# Patient Record
Sex: Female | Born: 1940
Health system: Southern US, Community
[De-identification: ages and names within clinical notes are randomized; demographics above are authoritative.]

## PROBLEM LIST (undated history)

## (undated) DIAGNOSIS — I209 Angina pectoris, unspecified: Secondary | ICD-10-CM

## (undated) DIAGNOSIS — Z5189 Encounter for other specified aftercare: Secondary | ICD-10-CM

## (undated) DIAGNOSIS — N189 Chronic kidney disease, unspecified: Secondary | ICD-10-CM

## (undated) DIAGNOSIS — F419 Anxiety disorder, unspecified: Secondary | ICD-10-CM

## (undated) DIAGNOSIS — K219 Gastro-esophageal reflux disease without esophagitis: Secondary | ICD-10-CM

## (undated) DIAGNOSIS — T7840XA Allergy, unspecified, initial encounter: Secondary | ICD-10-CM

## (undated) DIAGNOSIS — I10 Essential (primary) hypertension: Secondary | ICD-10-CM

## (undated) DIAGNOSIS — D649 Anemia, unspecified: Secondary | ICD-10-CM

## (undated) DIAGNOSIS — K5730 Diverticulosis of large intestine without perforation or abscess without bleeding: Secondary | ICD-10-CM

## (undated) DIAGNOSIS — E049 Nontoxic goiter, unspecified: Secondary | ICD-10-CM

## (undated) DIAGNOSIS — I2510 Atherosclerotic heart disease of native coronary artery without angina pectoris: Secondary | ICD-10-CM

## (undated) DIAGNOSIS — E785 Hyperlipidemia, unspecified: Secondary | ICD-10-CM

## (undated) DIAGNOSIS — M792 Neuralgia and neuritis, unspecified: Secondary | ICD-10-CM

## (undated) DIAGNOSIS — I219 Acute myocardial infarction, unspecified: Secondary | ICD-10-CM

## (undated) DIAGNOSIS — H269 Unspecified cataract: Secondary | ICD-10-CM

## (undated) DIAGNOSIS — K922 Gastrointestinal hemorrhage, unspecified: Secondary | ICD-10-CM

## (undated) DIAGNOSIS — B0229 Other postherpetic nervous system involvement: Secondary | ICD-10-CM

## (undated) DIAGNOSIS — M1990 Unspecified osteoarthritis, unspecified site: Secondary | ICD-10-CM

## (undated) HISTORY — DX: Anxiety disorder, unspecified: F41.9

## (undated) HISTORY — DX: Unspecified cataract: H26.9

## (undated) HISTORY — DX: Chronic kidney disease, unspecified: N18.9

## (undated) HISTORY — DX: Anemia, unspecified: D64.9

## (undated) HISTORY — PX: CARPAL TUNNEL RELEASE: SHX101

## (undated) HISTORY — PX: CORNEAL TRANSPLANT: SHX108

## (undated) HISTORY — DX: Encounter for other specified aftercare: Z51.89

## (undated) HISTORY — DX: Neuralgia and neuritis, unspecified: M79.2

## (undated) HISTORY — PX: TUBAL LIGATION: SHX77

## (undated) HISTORY — PX: HEEL SPUR EXCISION: SHX1733

## (undated) HISTORY — DX: Acute myocardial infarction, unspecified: I21.9

## (undated) HISTORY — DX: Hyperlipidemia, unspecified: E78.5

## (undated) HISTORY — DX: Allergy, unspecified, initial encounter: T78.40XA

## (undated) HISTORY — DX: Atherosclerotic heart disease of native coronary artery without angina pectoris: I25.10

## (undated) HISTORY — PX: TONSILLECTOMY: SUR1361

## (undated) HISTORY — DX: Other postherpetic nervous system involvement: B02.29

---

## 2008-06-11 ENCOUNTER — Emergency Department (HOSPITAL_COMMUNITY): Admission: EM | Admit: 2008-06-11 | Discharge: 2008-06-11 | Payer: Self-pay | Admitting: Emergency Medicine

## 2011-01-16 DIAGNOSIS — R072 Precordial pain: Secondary | ICD-10-CM

## 2011-10-21 ENCOUNTER — Encounter (INDEPENDENT_AMBULATORY_CARE_PROVIDER_SITE_OTHER): Payer: Self-pay | Admitting: *Deleted

## 2011-10-22 ENCOUNTER — Encounter (INDEPENDENT_AMBULATORY_CARE_PROVIDER_SITE_OTHER): Payer: Self-pay

## 2013-08-17 DIAGNOSIS — I219 Acute myocardial infarction, unspecified: Secondary | ICD-10-CM

## 2013-08-17 HISTORY — PX: CORONARY ANGIOPLASTY: SHX604

## 2013-08-17 HISTORY — DX: Acute myocardial infarction, unspecified: I21.9

## 2013-08-22 ENCOUNTER — Encounter (HOSPITAL_COMMUNITY): Payer: Self-pay | Admitting: Emergency Medicine

## 2013-08-22 ENCOUNTER — Inpatient Hospital Stay (HOSPITAL_COMMUNITY)
Admission: EM | Admit: 2013-08-22 | Discharge: 2013-08-25 | DRG: 247 | Disposition: A | Discharge status: Home / Self Care | Payer: Medicare Other | Attending: Internal Medicine | Admitting: Internal Medicine

## 2013-08-22 ENCOUNTER — Emergency Department (HOSPITAL_COMMUNITY): Payer: Medicare Other

## 2013-08-22 DIAGNOSIS — I214 Non-ST elevation (NSTEMI) myocardial infarction: Secondary | ICD-10-CM

## 2013-08-22 DIAGNOSIS — Z79899 Other long term (current) drug therapy: Secondary | ICD-10-CM

## 2013-08-22 DIAGNOSIS — Z6834 Body mass index (BMI) 34.0-34.9, adult: Secondary | ICD-10-CM

## 2013-08-22 DIAGNOSIS — Z955 Presence of coronary angioplasty implant and graft: Secondary | ICD-10-CM

## 2013-08-22 DIAGNOSIS — I2510 Atherosclerotic heart disease of native coronary artery without angina pectoris: Secondary | ICD-10-CM | POA: Diagnosis present

## 2013-08-22 DIAGNOSIS — Z7982 Long term (current) use of aspirin: Secondary | ICD-10-CM

## 2013-08-22 DIAGNOSIS — Z23 Encounter for immunization: Secondary | ICD-10-CM

## 2013-08-22 DIAGNOSIS — I10 Essential (primary) hypertension: Secondary | ICD-10-CM | POA: Diagnosis present

## 2013-08-22 DIAGNOSIS — D649 Anemia, unspecified: Secondary | ICD-10-CM | POA: Diagnosis present

## 2013-08-22 DIAGNOSIS — F419 Anxiety disorder, unspecified: Secondary | ICD-10-CM | POA: Diagnosis present

## 2013-08-22 DIAGNOSIS — E785 Hyperlipidemia, unspecified: Secondary | ICD-10-CM | POA: Diagnosis present

## 2013-08-22 DIAGNOSIS — D509 Iron deficiency anemia, unspecified: Secondary | ICD-10-CM | POA: Diagnosis present

## 2013-08-22 DIAGNOSIS — K219 Gastro-esophageal reflux disease without esophagitis: Secondary | ICD-10-CM | POA: Diagnosis present

## 2013-08-22 DIAGNOSIS — F411 Generalized anxiety disorder: Secondary | ICD-10-CM | POA: Diagnosis present

## 2013-08-22 DIAGNOSIS — I252 Old myocardial infarction: Secondary | ICD-10-CM | POA: Diagnosis present

## 2013-08-22 DIAGNOSIS — R079 Chest pain, unspecified: Secondary | ICD-10-CM | POA: Diagnosis present

## 2013-08-22 HISTORY — DX: Gastro-esophageal reflux disease without esophagitis: K21.9

## 2013-08-22 HISTORY — DX: Essential (primary) hypertension: I10

## 2013-08-22 HISTORY — DX: Nontoxic goiter, unspecified: E04.9

## 2013-08-22 HISTORY — DX: Atherosclerotic heart disease of native coronary artery without angina pectoris: I25.10

## 2013-08-22 HISTORY — DX: Unspecified osteoarthritis, unspecified site: M19.90

## 2013-08-22 HISTORY — DX: Angina pectoris, unspecified: I20.9

## 2013-08-22 HISTORY — DX: Acute myocardial infarction, unspecified: I21.9

## 2013-08-22 LAB — CBC WITH DIFFERENTIAL/PLATELET
BASOS ABS: 0 10*3/uL (ref 0.0–0.1)
BASOS PCT: 0 % (ref 0–1)
EOS ABS: 0 10*3/uL (ref 0.0–0.7)
Eosinophils Relative: 1 % (ref 0–5)
HCT: 32.7 % — ABNORMAL LOW (ref 36.0–46.0)
HEMOGLOBIN: 10.9 g/dL — AB (ref 12.0–15.0)
Lymphocytes Relative: 23 % (ref 12–46)
Lymphs Abs: 1.7 10*3/uL (ref 0.7–4.0)
MCH: 30.9 pg (ref 26.0–34.0)
MCHC: 33.3 g/dL (ref 30.0–36.0)
MCV: 92.6 fL (ref 78.0–100.0)
Monocytes Absolute: 0.4 10*3/uL (ref 0.1–1.0)
Monocytes Relative: 5 % (ref 3–12)
NEUTROS ABS: 5.1 10*3/uL (ref 1.7–7.7)
NEUTROS PCT: 71 % (ref 43–77)
PLATELETS: 275 10*3/uL (ref 150–400)
RBC: 3.53 MIL/uL — ABNORMAL LOW (ref 3.87–5.11)
RDW: 13.7 % (ref 11.5–15.5)
WBC: 7.2 10*3/uL (ref 4.0–10.5)

## 2013-08-22 LAB — COMPREHENSIVE METABOLIC PANEL
ALBUMIN: 3.3 g/dL — AB (ref 3.5–5.2)
ALK PHOS: 79 U/L (ref 39–117)
ALT: 9 U/L (ref 0–35)
AST: 13 U/L (ref 0–37)
BILIRUBIN TOTAL: 0.2 mg/dL — AB (ref 0.3–1.2)
BUN: 21 mg/dL (ref 6–23)
CHLORIDE: 109 meq/L (ref 96–112)
CO2: 21 mEq/L (ref 19–32)
Calcium: 9.2 mg/dL (ref 8.4–10.5)
Creatinine, Ser: 1.01 mg/dL (ref 0.50–1.10)
GFR calc Af Amer: 63 mL/min — ABNORMAL LOW (ref >90)
GFR calc non Af Amer: 54 mL/min — ABNORMAL LOW (ref >90)
Glucose, Bld: 108 mg/dL — ABNORMAL HIGH (ref 70–99)
POTASSIUM: 4.2 meq/L (ref 3.7–5.3)
SODIUM: 146 meq/L (ref 137–147)
TOTAL PROTEIN: 7.1 g/dL (ref 6.0–8.3)

## 2013-08-22 LAB — I-STAT TROPONIN, ED: Troponin i, poc: 0.03 ng/mL (ref 0.00–0.08)

## 2013-08-22 LAB — APTT: APTT: 32 s (ref 24–37)

## 2013-08-22 LAB — PROTIME-INR
INR: 1.07 (ref 0.00–1.49)
Prothrombin Time: 13.7 seconds (ref 11.6–15.2)

## 2013-08-22 MED ORDER — LISINOPRIL 20 MG PO TABS
20.0000 mg | ORAL_TABLET | Freq: Every day | ORAL | Status: DC
  Administered 2013-08-23 – 2013-08-25 (×3): 20 mg via ORAL
  Filled 2013-08-22 (×4): qty 1

## 2013-08-22 MED ORDER — ALPRAZOLAM 0.25 MG PO TABS
0.2500 mg | ORAL_TABLET | Freq: Two times a day (BID) | ORAL | Status: DC | PRN
  Administered 2013-08-24 (×2): 0.25 mg via ORAL
  Filled 2013-08-22 (×2): qty 1

## 2013-08-22 MED ORDER — ENOXAPARIN SODIUM 40 MG/0.4ML ~~LOC~~ SOLN
40.0000 mg | Freq: Every day | SUBCUTANEOUS | Status: DC
  Filled 2013-08-22: qty 0.4

## 2013-08-22 MED ORDER — PANTOPRAZOLE SODIUM 40 MG PO TBEC
40.0000 mg | DELAYED_RELEASE_TABLET | Freq: Two times a day (BID) | ORAL | Status: DC
  Administered 2013-08-22 – 2013-08-25 (×5): 40 mg via ORAL
  Filled 2013-08-22 (×5): qty 1

## 2013-08-22 MED ORDER — SODIUM CHLORIDE 0.9 % IV BOLUS (SEPSIS)
500.0000 mL | Freq: Once | INTRAVENOUS | Status: AC
  Administered 2013-08-22: 500 mL via INTRAVENOUS

## 2013-08-22 MED ORDER — ASPIRIN EC 81 MG PO TBEC
81.0000 mg | DELAYED_RELEASE_TABLET | Freq: Every day | ORAL | Status: DC
  Administered 2013-08-24 – 2013-08-25 (×2): 81 mg via ORAL
  Filled 2013-08-22 (×3): qty 1

## 2013-08-22 MED ORDER — NITROGLYCERIN 2 % TD OINT
0.5000 [in_us] | TOPICAL_OINTMENT | Freq: Four times a day (QID) | TRANSDERMAL | Status: AC
  Filled 2013-08-22: qty 30

## 2013-08-22 MED ORDER — LINACLOTIDE 145 MCG PO CAPS
145.0000 ug | ORAL_CAPSULE | Freq: Every day | ORAL | Status: DC
  Administered 2013-08-23 – 2013-08-25 (×3): 145 ug via ORAL
  Filled 2013-08-22 (×4): qty 1

## 2013-08-22 MED ORDER — ONDANSETRON HCL 4 MG/2ML IJ SOLN
4.0000 mg | Freq: Once | INTRAMUSCULAR | Status: AC
Start: 2013-08-22 — End: 2013-08-22
  Administered 2013-08-22: 4 mg via INTRAVENOUS
  Filled 2013-08-22: qty 2

## 2013-08-22 MED ORDER — PANTOPRAZOLE SODIUM 40 MG PO TBEC: 40.0000 mg | DELAYED_RELEASE_TABLET | Freq: Every day | ORAL | Status: DC

## 2013-08-22 MED ORDER — NITROGLYCERIN 0.4 MG SL SUBL
0.4000 mg | SUBLINGUAL_TABLET | SUBLINGUAL | Status: DC | PRN
  Administered 2013-08-22 (×2): 0.4 mg via SUBLINGUAL
  Filled 2013-08-22: qty 1

## 2013-08-22 MED ORDER — GI COCKTAIL ~~LOC~~
30.0000 mL | Freq: Once | ORAL | Status: AC
Start: 2013-08-22 — End: 2013-08-22
  Administered 2013-08-22: 30 mL via ORAL
  Filled 2013-08-22: qty 30

## 2013-08-22 MED ORDER — NITROGLYCERIN 0.4 MG SL SUBL: 0.4000 mg | SUBLINGUAL_TABLET | SUBLINGUAL | Status: DC | PRN

## 2013-08-22 NOTE — H&P (Addendum)
[facility] History and Physical  Patient: Shelly [name]  [id number]  DOB: 03-20-1941  DOS: the patient was seen and examined on 08/22/2013 PCP: [name], MD  Chief Complaint: Chest pain  HPI: Shelly [name] is a 73 y.o. female with Past medical history of hypertension, arthritis. The patient presented with complaints of chest tightness which is substernal in location associated with left arm pain radiating to her neck. This was associated with shortness of breath and diaphoresis. This occurred when she was walking at work. It resolved within a few minutes and then recurred. Therefore she came to the hospital. She denies any dizziness, nausea, vomiting. She does complain of acid reflux. She denies any orthopnea or PND but does mentions that whenever she lies down in the night she starts having complaint of chest pain and sometimes the pain wakes her up from the sleep. She denies any leg swelling or recent immobilization or surgery. She has significant stress in her life with death of her daughter last year, since then her appetite is poor her sleep is less, she has lost weight. She mentions she has similar chest pain on and off since last one year for which she has seen her PCP who has performed a stress test 5-6 months ago which was apparently normal as per the patient and she was recommended that she should be taking care of herself with appropriate nutrition. Today she mentions her pain has been improved with nitroglycerin as well as GI cocktail.  The patient is coming from home. And at her baseline Independent for most of her  ADL.  Review of Systems: as mentioned in the history of present illness.  A Comprehensive review of the other systems is negative.  Past Medical History  Diagnosis Date  . Goiter   . Hypertension   . Arthritis    Past Surgical History  Procedure Laterality Date  . Corneal transplant    . Tonsillectomy    . Tubal ligation     Social History:   reports that she has never smoked. She does not have any smokeless tobacco history on file. She reports that she does not drink alcohol. Her drug history is not on file.  Allergies  Allergen Reactions  . Bayer Advanced Aspirin [Aspirin] Palpitations    Patient can tolerate "low dose" aspirin    No family history on file.  Prior to Admission medications   Medication Sig Start Date End Date Taking? Authorizing Provider  aspirin EC 81 MG tablet Take 81 mg by mouth daily.   Yes Historical Provider, MD  B Complex-C (SUPER B COMPLEX PO) Take 1 tablet by mouth daily.   Yes Historical Provider, MD  Calcium Carb-Cholecalciferol (CALCIUM 600 + D PO) Take 1 tablet by mouth daily.   Yes Historical Provider, MD  Cholecalciferol (VITAMIN D-3) 1000 UNITS CAPS Take 1 capsule by mouth daily.   Yes Historical Provider, MD  diclofenac (VOLTAREN) 75 MG EC tablet Take 75 mg by mouth 2 (two) times daily.   Yes Historical Provider, MD  Linaclotide Rolan Lipa) 145 MCG CAPS capsule Take 145 mcg by mouth daily.   Yes Historical Provider, MD  lisinopril (PRINIVIL,ZESTRIL) 20 MG tablet Take 20 mg by mouth daily. 07/25/13  Yes Historical Provider, MD  Omega-3 Fatty Acids (FISH OIL) 1200 MG CAPS Take 1 capsule by mouth daily.   Yes Historical Provider, MD  omeprazole (PRILOSEC) 20 MG capsule Take 20 mg by mouth daily. 07/25/13  Yes Historical Provider, MD  Red Yeast Rice 600  MG TABS Take 1 tablet by mouth daily.   Yes Historical Provider, MD    Physical Exam: Filed Vitals:   08/22/13 1947 08/22/13 1947 08/22/13 2030 08/22/13 2034  BP: 119/70 119/70 151/71 151/71  Pulse: 68 59 59 66  Temp:    98.2 F (36.8 C)  TempSrc:    Oral  Resp:  20 15 15   Height:      Weight:      SpO2: 100% 100% 100% 100%    General: Alert, Awake and Oriented to Time, Place and Person. Appear in mild distress Eyes: PERRL ENT: Oral Mucosa clear moist. Neck: no JVD Cardiovascular: S1 and S2 Present, aortic systolic Murmur, Peripheral  Pulses Present Respiratory: Bilateral Air entry equal and Decreased, Clear to Auscultation,  no Crackles,no wheezes Abdomen: Bowel Sound Present, Soft and Non tender Skin: no Rash Extremities: Trace Pedal edema, no calf tenderness Neurologic: Grossly Unremarkable.  Labs on Admission:  CBC:  Recent Labs Lab 08/22/13 1850  WBC 7.2  NEUTROABS 5.1  HGB 10.9*  HCT 32.7*  MCV 92.6  PLT 275    CMP     Component Value Date/Time   NA 146 08/22/2013 1850   K 4.2 08/22/2013 1850   CL 109 08/22/2013 1850   CO2 21 08/22/2013 1850   GLUCOSE 108* 08/22/2013 1850   BUN 21 08/22/2013 1850   CREATININE 1.01 08/22/2013 1850   CALCIUM 9.2 08/22/2013 1850   PROT 7.1 08/22/2013 1850   ALBUMIN 3.3* 08/22/2013 1850   AST 13 08/22/2013 1850   ALT 9 08/22/2013 1850   ALKPHOS 79 08/22/2013 1850   BILITOT 0.2* 08/22/2013 1850   GFRNONAA 54* 08/22/2013 1850   GFRAA 63* 08/22/2013 1850    No results found for this basename: LIPASE, AMYLASE,  in the last 168 hours No results found for this basename: AMMONIA,  in the last 168 hours  No results found for this basename: CKTOTAL, CKMB, CKMBINDEX, TROPONINI,  in the last 168 hours BNP (last 3 results) No results found for this basename: PROBNP,  in the last 8760 hours  Radiological Exams on Admission: Dg Chest 2 View  08/22/2013   CLINICAL DATA:  Central left-sided chest pain.  EXAM: CHEST  2 VIEW  COMPARISON:  None.  FINDINGS: There is opacity at the left lung base. This is consistent with pneumonia or other infiltrate.  Lungs are otherwise clear.  No pleural effusion.  No pneumothorax.  Cardiac silhouette is normal in size. Aorta is mildly uncoiled. No mediastinal or hilar masses.  Bony thorax is demineralized but grossly intact.  IMPRESSION: Left lower lobe infiltrate.   Electronically Signed   By: Lajean Manes M.D.   On: 08/22/2013 19:40    EKG: Independently reviewed. normal sinus rhythm, nonspecific ST and T waves changes.  Assessment/Plan Principal Problem:   Chest  pain Active Problems:   Hypertension   GERD (gastroesophageal reflux disease)   Anxiety   1. Chest pain The patient is presenting with complaints of chest pain and chest tightness. She has a burning chest pain which is located substernally and occurs when she is changing her position and also she has a chest tightness which is also located substernally but radiating to her left arm and jaw and has been occurring on and off since last few months. She had a negative stress test 4-6 months ago as per the patient. Initial troponin is negative EKG does not show any signs of acute ischemia but he does have lead 3 T  wave inversion. With this the patient will be admitted to the hospital. We will continue her on aspirin, follow serial troponin, telemetry monitoring, limited echocardiogram and possible stress test in the morning. To keep the patient n.p.o. after midnight.  2. Hypertension Continue lisinopril  3. GERD Protonix and GI cocktail  4. Anxiety Continue Linzess  DVT Prophylaxis: subcutaneous Heparin Nutrition: N.p.o. after midnight  Code Status: Full  Family Communication: Family was present at bedside, opportunity was given to ask question and all questions were answered satisfactorily at the time of interview. Disposition: Admitted to observation in telemetry unit.  Author: Berle Mull, MD [facility] Pager: [phone] 08/22/2013, 9:15 PM    If 7PM-7AM, please contact night-coverage [email or web address] Password [email or web address]    Addendum Patient's repeat troponins done back positive 1.10. Discussed with patient she remained chest pain-free repeating EKG. Discuss with patient to place her on IV heparin, aspirin and Lopressor as well as Lipitor and cardiology consult. Patient agrees with the plan. Discuss with cardiology  Fellow Dr Delane Ginger on call for further management and consult. Patient remains n.p.o. after midnight. IV heparin.  Author: Berle Mull, MD [facility] Pager: [phone] 08/23/2013 1:41 AM

## 2013-08-22 NOTE — ED Notes (Addendum)
Patient c/o intermittent chest burning and discomfort x 2 weeks at night. sts today pain started today at 4pm, pain just started easing off when EMS rolled into ED sts pain felt different like "someone sitting on her chest" and "burning" from epigastric region to throat. Pt was SOB and diaphoretic and lightheaded- had near syncopal episode. Pt denies pain at present time, radial pulses 2+, skin warm and dry.

## 2013-08-22 NOTE — ED Provider Notes (Addendum)
CSN: [id number]     Arrival date & time 08/22/13  1802 History   First MD Initiated Contact with Patient 08/22/13 1806     Chief Complaint  Patient presents with  . Chest Pain     (Consider location/radiation/quality/duration/timing/severity/associated sxs/prior Treatment) Patient is a 73 y.o. female presenting with chest pain. The history is provided by the patient.  Chest Pain Pain location:  Substernal area Pain quality: burning and pressure   Pain radiates to:  L jaw and L shoulder Pain radiates to the back: no   Pain severity:  Severe Onset quality:  Sudden Duration:  2 hours Timing:  Constant Progression:  Partially resolved Chronicity:  Recurrent Context comment:  States for the last 2 weeks she has had intermittent chest pain mostly at night until today Relieved by:  None tried Exacerbated by: activity and lying down at night. Ineffective treatments:  None tried Associated symptoms: diaphoresis, dizziness, near-syncope and shortness of breath   Associated symptoms: no abdominal pain, no anorexia, no back pain, no cough and not vomiting   Risk factors: hypertension   Risk factors: no coronary artery disease and no diabetes mellitus     Past Medical History  Diagnosis Date  . Goiter    No past surgical history on file. No family history on file. History  Substance Use Topics  . Smoking status: Not on file  . Smokeless tobacco: Not on file  . Alcohol Use: Not on file   OB History   Grav Para Term Preterm Abortions TAB SAB Ect Mult Living                 Review of Systems  Constitutional: Positive for diaphoresis.  Respiratory: Positive for shortness of breath. Negative for cough.   Cardiovascular: Positive for chest pain and near-syncope.  Gastrointestinal: Negative for vomiting, abdominal pain and anorexia.  Musculoskeletal: Negative for back pain.  Neurological: Positive for dizziness.  All other systems reviewed and are negative.      Allergies   Review of patient's allergies indicates not on file.  Home Medications  No current outpatient prescriptions on file. BP 125/67  Pulse 63  Temp(Src) 97.9 F (36.6 C) (Oral)  Resp 19  Ht 5\' 8"  (1.727 m)  Wt 232 lb (105.235 kg)  BMI 35.28 kg/m2  SpO2 100% Physical Exam  Nursing note and vitals reviewed. Constitutional: She is oriented to person, place, and time. She appears well-developed and well-nourished. No distress.  HENT:  Head: Normocephalic and atraumatic.  Mouth/Throat: Oropharynx is clear and moist.  Eyes: Conjunctivae and EOM are normal. Pupils are equal, round, and reactive to light.  Neck: Normal range of motion. Neck supple.  Cardiovascular: Normal rate, regular rhythm and intact distal pulses.   No murmur heard. Pulmonary/Chest: Effort normal and breath sounds normal. No respiratory distress. She has no wheezes. She has no rales.  Abdominal: Soft. She exhibits no distension. There is no tenderness. There is no rebound and no guarding.  Musculoskeletal: Normal range of motion. She exhibits edema. She exhibits no tenderness.  Bilateral trace edema of lower ext  Neurological: She is alert and oriented to person, place, and time.  Skin: Skin is warm and dry. No rash noted. No erythema.  Psychiatric: She has a normal mood and affect. Her behavior is normal.    ED Course  Procedures (including critical care time) Labs Review Labs Reviewed  CBC WITH DIFFERENTIAL - Abnormal; Notable for the following:    RBC 3.53 (*)  Hemoglobin 10.9 (*)    HCT 32.7 (*)    All other components within normal limits  COMPREHENSIVE METABOLIC PANEL - Abnormal; Notable for the following:    Glucose, Bld 108 (*)    Albumin 3.3 (*)    Total Bilirubin 0.2 (*)    GFR calc non Af Amer 54 (*)    GFR calc Af Amer 63 (*)    All other components within normal limits  PROTIME-INR  APTT  I-STAT TROPOININ, ED   Imaging Review Dg Chest 2 View  08/22/2013   CLINICAL DATA:  Central left-sided  chest pain.  EXAM: CHEST  2 VIEW  COMPARISON:  None.  FINDINGS: There is opacity at the left lung base. This is consistent with pneumonia or other infiltrate.  Lungs are otherwise clear.  No pleural effusion.  No pneumothorax.  Cardiac silhouette is normal in size. Aorta is mildly uncoiled. No mediastinal or hilar masses.  Bony thorax is demineralized but grossly intact.  IMPRESSION: Left lower lobe infiltrate.   Electronically Signed   By: Lajean Manes M.D.   On: 08/22/2013 19:40     EKG Interpretation   Date/Time:  Monday August 22 2013 18:21:13 EDT Ventricular Rate:  55 PR Interval:  155 QRS Duration: 107 QT Interval:  448 QTC Calculation: 428 R Axis:   46 Text Interpretation:  Sinus rhythm Normal ECG No previous tracing  Confirmed by Maryan Rued  MD, Loree Fee ([id number]) on 08/22/2013 7:12:39 PM      MDM   Final diagnoses:  Chest pain    Patient presenting with 2 weeks of intermittent chest discomfort that usually happens at night but today happened at 4 PM while she was at work with diaphoresis, shortness of breath and near syncope. Patient relates some of the pain episodes over the last few weeks to what she ate today she has not eaten anything. No prior history of cardiac disease but does have hypertension. States approximately 4-5 months ago patient had a stress test and EKG done by her PCP are within normal limits. Currently the pain is almost gone the last approximately 2 hours with a concerning story.  ddx is ACS vs GI pathology vs stress as pt's daughter recently died.  EKG, chest x-ray, CBC, CMP, coags, troponin pending  7:17 PM EKG wnl.  Initial troponin neg.  After 2 NTG no improvement in pain and pt given GI cocktail.  7:59 PM Labs wnl.  However given story feel pt need admission for r/o.  Pt is pain free now.  She states gi cocktail did not change sx but maybe NTG did when I went back in.    Blanchie Dessert, MD 08/22/13 2000  Blanchie Dessert, MD 08/22/13 2030

## 2013-08-22 NOTE — ED Notes (Signed)
Per EMS pt from home c/o intermittent central, non-radiating, CP 8/10 x 2 days. Describes pain as "something sitting on her chest". Pt has had 324 ASA. No nitroglycerin given per EMS due to poss. Right sided MI. Noted inferior MI elevation appx 3 min on V3, V4 and AVf returned to baseline. Pt under a lot of stress and anxiety. Pt tearful.

## 2013-08-22 NOTE — ED Notes (Signed)
Dr. Maryan Rued made aware pt CP unchanged

## 2013-08-22 NOTE — ED Notes (Signed)
Admitting at bedside 

## 2013-08-23 ENCOUNTER — Encounter (HOSPITAL_COMMUNITY)
Admission: EM | Disposition: A | Discharge status: Home / Self Care | Payer: Medicare Other | Source: Home / Self Care | Attending: Internal Medicine

## 2013-08-23 DIAGNOSIS — I214 Non-ST elevation (NSTEMI) myocardial infarction: Secondary | ICD-10-CM

## 2013-08-23 DIAGNOSIS — D509 Iron deficiency anemia, unspecified: Secondary | ICD-10-CM | POA: Diagnosis present

## 2013-08-23 DIAGNOSIS — I252 Old myocardial infarction: Secondary | ICD-10-CM | POA: Diagnosis present

## 2013-08-23 DIAGNOSIS — I059 Rheumatic mitral valve disease, unspecified: Secondary | ICD-10-CM

## 2013-08-23 DIAGNOSIS — D649 Anemia, unspecified: Secondary | ICD-10-CM

## 2013-08-23 DIAGNOSIS — I2510 Atherosclerotic heart disease of native coronary artery without angina pectoris: Secondary | ICD-10-CM

## 2013-08-23 HISTORY — PX: LEFT HEART CATHETERIZATION WITH CORONARY ANGIOGRAM: SHX5451

## 2013-08-23 LAB — CBC
HEMATOCRIT: 31.7 % — AB (ref 36.0–46.0)
Hemoglobin: 10.3 g/dL — ABNORMAL LOW (ref 12.0–15.0)
MCH: 30.4 pg (ref 26.0–34.0)
MCHC: 32.5 g/dL (ref 30.0–36.0)
MCV: 93.5 fL (ref 78.0–100.0)
Platelets: 262 10*3/uL (ref 150–400)
RBC: 3.39 MIL/uL — ABNORMAL LOW (ref 3.87–5.11)
RDW: 14 % (ref 11.5–15.5)
WBC: 6 10*3/uL (ref 4.0–10.5)

## 2013-08-23 LAB — COMPREHENSIVE METABOLIC PANEL
ALT: 9 U/L (ref 0–35)
AST: 21 U/L (ref 0–37)
Albumin: 2.9 g/dL — ABNORMAL LOW (ref 3.5–5.2)
Alkaline Phosphatase: 74 U/L (ref 39–117)
BILIRUBIN TOTAL: 0.3 mg/dL (ref 0.3–1.2)
BUN: 18 mg/dL (ref 6–23)
CALCIUM: 9.3 mg/dL (ref 8.4–10.5)
CHLORIDE: 109 meq/L (ref 96–112)
CO2: 22 mEq/L (ref 19–32)
Creatinine, Ser: 1.1 mg/dL (ref 0.50–1.10)
GFR calc Af Amer: 57 mL/min — ABNORMAL LOW (ref >90)
GFR, EST NON AFRICAN AMERICAN: 49 mL/min — AB (ref >90)
Glucose, Bld: 88 mg/dL (ref 70–99)
Potassium: 4.5 mEq/L (ref 3.7–5.3)
Sodium: 143 mEq/L (ref 137–147)
Total Protein: 6.4 g/dL (ref 6.0–8.3)

## 2013-08-23 LAB — TROPONIN I
Troponin I: 1.1 ng/mL (ref <0.30)
Troponin I: 2.07 ng/mL (ref <0.30)

## 2013-08-23 SURGERY — LEFT HEART CATHETERIZATION WITH CORONARY ANGIOGRAM
Anesthesia: LOCAL

## 2013-08-23 MED ORDER — FENTANYL CITRATE 0.05 MG/ML IJ SOLN
INTRAMUSCULAR | Status: AC
  Filled 2013-08-23: qty 2

## 2013-08-23 MED ORDER — HEPARIN (PORCINE) IN NACL 100-0.45 UNIT/ML-% IJ SOLN
1200.0000 [IU]/h | INTRAMUSCULAR | Status: DC
  Administered 2013-08-23: 1200 [IU]/h via INTRAVENOUS
  Filled 2013-08-23 (×2): qty 250

## 2013-08-23 MED ORDER — SODIUM CHLORIDE 0.9 % IV SOLN
1.0000 mL/kg/h | INTRAVENOUS | Status: DC
  Administered 2013-08-24: 1 mL/kg/h via INTRAVENOUS

## 2013-08-23 MED ORDER — MIDAZOLAM HCL 2 MG/2ML IJ SOLN
INTRAMUSCULAR | Status: AC
  Filled 2013-08-23: qty 2

## 2013-08-23 MED ORDER — SODIUM CHLORIDE 0.9 % IV SOLN: 1.0000 mL/kg/h | INTRAVENOUS | Status: AC

## 2013-08-23 MED ORDER — SODIUM CHLORIDE 0.9 % IV SOLN: 250.0000 mL | INTRAVENOUS | Status: DC | PRN

## 2013-08-23 MED ORDER — SODIUM CHLORIDE 0.9 % IJ SOLN: 3.0000 mL | Freq: Two times a day (BID) | INTRAMUSCULAR | Status: DC

## 2013-08-23 MED ORDER — NITROGLYCERIN 0.2 MG/ML ON CALL CATH LAB
INTRAVENOUS | Status: AC
  Filled 2013-08-23: qty 1

## 2013-08-23 MED ORDER — SODIUM CHLORIDE 0.9 % IV SOLN: INTRAVENOUS | Status: DC

## 2013-08-23 MED ORDER — ACETAMINOPHEN 325 MG PO TABS: 650.0000 mg | ORAL_TABLET | ORAL | Status: DC | PRN

## 2013-08-23 MED ORDER — TICAGRELOR 90 MG PO TABS
180.0000 mg | ORAL_TABLET | Freq: Once | ORAL | Status: AC
  Administered 2013-08-23: 180 mg via ORAL
  Filled 2013-08-23: qty 2

## 2013-08-23 MED ORDER — HEPARIN BOLUS VIA INFUSION
4000.0000 [IU] | Freq: Once | INTRAVENOUS | Status: AC
  Administered 2013-08-23: 4000 [IU] via INTRAVENOUS
  Filled 2013-08-23: qty 4000

## 2013-08-23 MED ORDER — HEPARIN SODIUM (PORCINE) 1000 UNIT/ML IJ SOLN
INTRAMUSCULAR | Status: AC
  Filled 2013-08-23: qty 1

## 2013-08-23 MED ORDER — VERAPAMIL HCL 2.5 MG/ML IV SOLN
INTRAVENOUS | Status: AC
  Filled 2013-08-23: qty 2

## 2013-08-23 MED ORDER — TICAGRELOR 90 MG PO TABS
90.0000 mg | ORAL_TABLET | Freq: Two times a day (BID) | ORAL | Status: AC
  Administered 2013-08-24: 90 mg via ORAL
  Filled 2013-08-23: qty 1

## 2013-08-23 MED ORDER — ASPIRIN 81 MG PO CHEW
CHEWABLE_TABLET | ORAL | Status: AC
  Filled 2013-08-23: qty 4

## 2013-08-23 MED ORDER — HEPARIN SODIUM (PORCINE) 5000 UNIT/ML IJ SOLN
5000.0000 [IU] | Freq: Three times a day (TID) | INTRAMUSCULAR | Status: DC
  Administered 2013-08-23: 5000 [IU] via SUBCUTANEOUS
  Filled 2013-08-23 (×5): qty 1

## 2013-08-23 MED ORDER — ONDANSETRON HCL 4 MG/2ML IJ SOLN: 4.0000 mg | Freq: Four times a day (QID) | INTRAMUSCULAR | Status: DC | PRN

## 2013-08-23 MED ORDER — METOPROLOL TARTRATE 12.5 MG HALF TABLET
12.5000 mg | ORAL_TABLET | Freq: Two times a day (BID) | ORAL | Status: DC
  Administered 2013-08-23 – 2013-08-25 (×4): 12.5 mg via ORAL
  Filled 2013-08-23 (×7): qty 1

## 2013-08-23 MED ORDER — DIAZEPAM 5 MG PO TABS: 5.0000 mg | ORAL_TABLET | ORAL | Status: DC

## 2013-08-23 MED ORDER — SODIUM CHLORIDE 0.9 % IJ SOLN: 3.0000 mL | INTRAMUSCULAR | Status: DC | PRN

## 2013-08-23 MED ORDER — LIDOCAINE HCL (PF) 1 % IJ SOLN
INTRAMUSCULAR | Status: AC
  Filled 2013-08-23: qty 30

## 2013-08-23 MED ORDER — ATORVASTATIN CALCIUM 80 MG PO TABS
80.0000 mg | ORAL_TABLET | Freq: Every day | ORAL | Status: DC
  Administered 2013-08-23 – 2013-08-24 (×2): 80 mg via ORAL
  Filled 2013-08-23 (×5): qty 1

## 2013-08-23 MED ORDER — ZOLPIDEM TARTRATE 5 MG PO TABS
5.0000 mg | ORAL_TABLET | Freq: Once | ORAL | Status: AC
  Administered 2013-08-23: 5 mg via ORAL
  Filled 2013-08-23: qty 1

## 2013-08-23 MED ORDER — HEPARIN (PORCINE) IN NACL 2-0.9 UNIT/ML-% IJ SOLN
INTRAMUSCULAR | Status: AC
  Filled 2013-08-23: qty 1000

## 2013-08-23 NOTE — Progress Notes (Signed)
PROGRESS NOTE    Shelly [name] [id number] DOB: [birth year] DOA: 08/22/2013 PCP: [name], MD  HPI/Brief narrative 73 year old female with history of hypertension and obesity, was admitted on 08/22/13 with complaints of substernal chest pain radiating to left arm and neck, associated with dyspnea and diaphoresis. This was exertional while at work. She ruled in for NSTEMI. She underwent LHC which showed severe single-vessel obstructive CAD complex disease in the proximal to mid RCA-unable to do PCI today and cardiology plans to do PCI on 4/8.   Assessment/Plan:  1. NSTEMI: Status post LHC on 4/7 which showed severe single-vessel obstructive CAD with complex disease in the proximal to mid RCA and normal LV function. Cardiology was unable to do PCI today. They plan to do PCI on 08/24/13 from a femoral approach. Currently chest pain-free. Continue aspirin, statins, metoprolol & Brilinta. 2. Hypertension: Controlled. Continue lisinopril and metoprolol 3. Morbid obesity 4. Anemia: Seems chronic and stable. Follow CBC in a.m. 5. ? Abnormal chest x-ray: Reports left lower lobe infiltrate. Patient however denies cough, dyspnea, fever or chills. In the absence of symptoms, signs and no leukocytosis-will not treat with antibiotics. Consider repeating chest x-ray as outpatient   Code Status: Full Family Communication: Discussed with family at bedside Disposition Plan: Remains inpatient. Home when medically stable   Consultants:  Cardiology  Procedures:  LHC on 4/7  Antibiotics:  None   Subjective: Denies chest pain. Denies any other complaints.  Objective: Filed Vitals:   08/22/13 2158 08/23/13 0630 08/23/13 1018 08/23/13 1316  BP: 137/46 126/55  131/56  Pulse: 66 56 61 66  Temp: 98.3 F (36.8 C) 99 F (37.2 C)    TempSrc: Oral Oral    Resp: 20 20  20   Height: 5\' 8"  (1.727 m)     Weight: 101.923 kg (224 lb 11.2 oz) 101.833 kg (224 lb 8 oz)    SpO2:  100%  100%     Intake/Output Summary (Last 24 hours) at 08/23/13 1544 Last data filed at 08/23/13 0735  Gross per 24 hour  Intake      0 ml  Output    400 ml  Net   -400 ml   Filed Weights   08/22/13 1815 08/22/13 2158 08/23/13 0630  Weight: 105.235 kg (232 lb) 101.923 kg (224 lb 11.2 oz) 101.833 kg (224 lb 8 oz)     Exam:  General exam: Elderly female, looks younger than stated age, moderately built and morbidly obese, lying comfortably supine in bed. Respiratory system: Clear. No increased work of breathing. Cardiovascular system: S1 & S2 heard, RRR. No JVD, murmurs, gallops, clicks or pedal edema. Telemetry sinus rhythm-sinus bradycardia in the 50s Gastrointestinal system: Abdomen is nondistended, soft and nontender. Normal bowel sounds heard. Central nervous system: Alert and oriented. No focal neurological deficits. Extremities: Symmetric 5 x 5 power.   Data Reviewed: Basic Metabolic Panel:  Recent Labs Lab 08/22/13 1850  NA 146  K 4.2  CL 109  CO2 21  GLUCOSE 108*  BUN 21  CREATININE 1.01  CALCIUM 9.2   Liver Function Tests:  Recent Labs Lab 08/22/13 1850  AST 13  ALT 9  ALKPHOS 79  BILITOT 0.2*  PROT 7.1  ALBUMIN 3.3*   No results found for this basename: LIPASE, AMYLASE,  in the last 168 hours No results found for this basename: AMMONIA,  in the last 168 hours CBC:  Recent Labs Lab 08/22/13 1850 08/23/13 1446  WBC 7.2 6.0  NEUTROABS 5.1  --  HGB 10.9* 10.3*  HCT 32.7* 31.7*  MCV 92.6 93.5  PLT 275 262   Cardiac Enzymes:  Recent Labs Lab 08/22/13 2356 08/23/13 0519  TROPONINI 1.10* 2.07*   BNP (last 3 results) No results found for this basename: PROBNP,  in the last 8760 hours CBG: No results found for this basename: GLUCAP,  in the last 168 hours  No results found for this or any previous visit (from the past 240 hour(s)).    Additional labs: 1. None     Studies: Dg Chest 2 View  08/22/2013   CLINICAL DATA:  Central left-sided  chest pain.  EXAM: CHEST  2 VIEW  COMPARISON:  None.  FINDINGS: There is opacity at the left lung base. This is consistent with pneumonia or other infiltrate.  Lungs are otherwise clear.  No pleural effusion.  No pneumothorax.  Cardiac silhouette is normal in size. Aorta is mildly uncoiled. No mediastinal or hilar masses.  Bony thorax is demineralized but grossly intact.  IMPRESSION: Left lower lobe infiltrate.   Electronically Signed   By: Lajean Manes M.D.   On: 08/22/2013 19:40        Scheduled Meds: . aspirin EC  81 mg Oral Daily  . atorvastatin  80 mg Oral q1800  . heparin  5,000 Units Subcutaneous 3 times per day  . Linaclotide  145 mcg Oral Daily  . lisinopril  20 mg Oral Daily  . metoprolol tartrate  12.5 mg Oral BID  . nitroGLYCERIN  0.5 inch Topical 4 times per day  . pantoprazole  40 mg Oral BID   Continuous Infusions: . sodium chloride 1 mL/kg/hr (08/23/13 1345)    Principal Problem:   Chest pain Active Problems:   Hypertension   GERD (gastroesophageal reflux disease)   Anxiety   NSTEMI (non-ST elevated myocardial infarction)    Time spent: 25 minutes    Paxson Harrower, MD, FACP, FHM. [facility] Pager [phone]  If 7PM-7AM, please contact night-coverage [email or web address] Password [email or web address] 08/23/2013, 3:44 PM    LOS: 1 day

## 2013-08-23 NOTE — Progress Notes (Signed)
Utilization review completed.  

## 2013-08-23 NOTE — Progress Notes (Signed)
Please see note of Dr. Delane Ginger from 06:40 today. The patient is now stable on IV heparin. She is appropriately on aspirin, a high intensity statin drug, and a beta blocker. She has ruled in for non-ST elevation MI. She has CCS class IV symptoms. I have recommended cardiac catheterization and possible PCI. I have reviewed the risks, indications, and alternatives to this approach. Specific risks include but are not limited to stroke, vascular injury, bleeding, myocardial infarction, and death. The patient understands these risks and agrees to proceed. She has no plans for upcoming noncardiac surgery and also denies any history of bleeding problems or anemia.  Sherren Mocha 08/23/2013 9:57 AM

## 2013-08-23 NOTE — Interval H&P Note (Signed)
History and Physical Interval Note:  08/23/2013 10:09 AM  Shelly [name]  has presented today for surgery, with the diagnosis of non stemi  The various methods of treatment have been discussed with the patient and family. After consideration of risks, benefits and other options for treatment, the patient has consented to  Procedure(s): LEFT HEART CATHETERIZATION WITH CORONARY ANGIOGRAM (N/A) as a surgical intervention .  The patient's history has been reviewed, patient examined, no change in status, stable for surgery.  I have reviewed the patient's chart and labs.  Questions were answered to the patient's satisfaction.    Cath Lab Visit (complete for each Cath Lab visit)  Clinical Evaluation Leading to the Procedure:   ACS: yes  Non-ACS:    Anginal Classification: CCS IV  Anti-ischemic medical therapy: No Therapy  Non-Invasive Test Results: No non-invasive testing performed  Prior CABG: No previous CABG       Luana Shu 08/23/2013 10:09 AM

## 2013-08-23 NOTE — Progress Notes (Signed)
ANTICOAGULATION CONSULT NOTE - Initial Consult  Pharmacy Consult for Heparin Indication: chest pain/ACS  Allergies  Allergen Reactions  . Bayer Advanced Aspirin [Aspirin] Palpitations    Patient can tolerate "low dose" aspirin    Patient Measurements: Height: 5\' 8"  (172.7 cm) Weight: 224 lb 11.2 oz (101.923 kg) IBW/kg (Calculated) : 63.9 Heparin Dosing Weight: 90 kg  Vital Signs: Temp: 98.3 F (36.8 C) (04/06 2158) Temp src: Oral (04/06 2158) BP: 137/46 mmHg (04/06 2158) Pulse Rate: 66 (04/06 2158)  Labs:  Recent Labs  08/22/13 1850 08/22/13 2356  HGB 10.9*  --   HCT 32.7*  --   PLT 275  --   APTT 32  --   LABPROT 13.7  --   INR 1.07  --   CREATININE 1.01  --   TROPONINI  --  1.10*    Estimated Creatinine Clearance: 62.9 ml/min (by C-G formula based on Cr of 1.01).   Medical History: Past Medical History  Diagnosis Date  . Goiter   . Hypertension   . Arthritis     Medications:  Prescriptions prior to admission  Medication Sig Dispense Refill  . aspirin EC 81 MG tablet Take 81 mg by mouth daily.      . B Complex-C (SUPER B COMPLEX PO) Take 1 tablet by mouth daily.      . Calcium Carb-Cholecalciferol (CALCIUM 600 + D PO) Take 1 tablet by mouth daily.      . Cholecalciferol (VITAMIN D-3) 1000 UNITS CAPS Take 1 capsule by mouth daily.      . diclofenac (VOLTAREN) 75 MG EC tablet Take 75 mg by mouth 2 (two) times daily.      . Linaclotide (LINZESS) 145 MCG CAPS capsule Take 145 mcg by mouth daily.      Marland Kitchen lisinopril (PRINIVIL,ZESTRIL) 20 MG tablet Take 20 mg by mouth daily.      . Omega-3 Fatty Acids (FISH OIL) 1200 MG CAPS Take 1 capsule by mouth daily.      Marland Kitchen omeprazole (PRILOSEC) 20 MG capsule Take 20 mg by mouth daily.      . Red Yeast Rice 600 MG TABS Take 1 tablet by mouth daily.        Assessment: 73 y.o. female with chest pain for heparin  Goal of Therapy:  Heparin level 0.3-0.7 units/ml Monitor platelets by anticoagulation protocol: Yes    Plan:  Heparin 4000 units IV bolus, then 1200 units/hr Check heparin level in 6 hours.   Tearra Ouk, Bronson Curb 08/23/2013,1:35 AM

## 2013-08-23 NOTE — H&P (View-Only) (Signed)
Please see note of Dr. Delane Ginger from 06:40 today. The patient is now stable on IV heparin. She is appropriately on aspirin, a high intensity statin drug, and a beta blocker. She has ruled in for non-ST elevation MI. She has CCS class IV symptoms. I have recommended cardiac catheterization and possible PCI. I have reviewed the risks, indications, and alternatives to this approach. Specific risks include but are not limited to stroke, vascular injury, bleeding, myocardial infarction, and death. The patient understands these risks and agrees to proceed. She has no plans for upcoming noncardiac surgery and also denies any history of bleeding problems or anemia.  Sherren Mocha 08/23/2013 9:57 AM

## 2013-08-23 NOTE — Progress Notes (Signed)
Echocardiogram 2D Echocardiogram has been performed.  Shelly [name] 08/23/2013, 2:43 PM

## 2013-08-23 NOTE — Consult Note (Signed)
Cardiology Consultation Note  Patient ID: Shelly [name], MRN: [id number], DOB/AGE: 09-12-1940 73 y.o. Admit date: 08/22/2013   Date of Consult: 08/23/2013 Primary Physician: Shelly Blitz, MD Primary Cardiologist:   Chief Complaint: Chest pain  Reason for Consult: NSTEMI    73 yr old female with hx of HTN who is admitted with chest discomfort   HPI: pt states that for the past day or so she has been experiencing dull heaviness in her chest that prompted her to loosen her bra. She had some burning sensation down her neck . No SOB , nausea . Did have diaphoresis with this. Had similar symptoms about two weeks ago which she ignored. Denies any exertional complaints or any prior cardiac workup . Pt states that the GI cocktail and SL NTG that was given to her in the ED help resolve her symptoms  Pt denies any SOB , orthopnea, PND , LE edema , Syncope ,claudcation , focal weakness, or bleeding diathesis .  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 Goiter   . Hypertension   . Arthritis      Surgical History:  Past Surgical History  Procedure Laterality Date  . Corneal transplant    . Tonsillectomy    . Tubal ligation       Home Meds: Prior to Admission medications   Medication Sig Start Date End Date Taking? Authorizing Provider  aspirin EC 81 MG tablet Take 81 mg by mouth daily.   Yes Historical Provider, MD  B Complex-C (SUPER B COMPLEX PO) Take 1 tablet by mouth daily.   Yes Historical Provider, MD  Calcium Carb-Cholecalciferol (CALCIUM 600 + D PO) Take 1 tablet by mouth daily.   Yes Historical Provider, MD  Cholecalciferol (VITAMIN D-3) 1000 UNITS CAPS Take 1 capsule by mouth daily.   Yes Historical Provider, MD  diclofenac (VOLTAREN) 75 MG EC tablet Take 75 mg by mouth 2 (two) times daily.   Yes Historical Provider, MD    Linaclotide Rolan Lipa) 145 MCG CAPS capsule Take 145 mcg by mouth daily.   Yes Historical Provider, MD  lisinopril (PRINIVIL,ZESTRIL) 20 MG tablet Take 20 mg by mouth daily. 07/25/13  Yes Historical Provider, MD  Omega-3 Fatty Acids (FISH OIL) 1200 MG CAPS Take 1 capsule by mouth daily.   Yes Historical Provider, MD  omeprazole (PRILOSEC) 20 MG capsule Take 20 mg by mouth daily. 07/25/13  Yes Historical Provider, MD  Red Yeast Rice 600 MG TABS Take 1 tablet by mouth daily.   Yes Historical Provider, MD    Inpatient Medications:  . aspirin EC  81 mg Oral Daily  . atorvastatin  80 mg Oral q1800  . Linaclotide  145 mcg Oral Daily  . lisinopril  20 mg Oral Daily  . metoprolol tartrate  12.5 mg Oral BID  . nitroGLYCERIN  0.5 inch Topical 4 times per day  . pantoprazole  40 mg Oral BID   . heparin 1,200 Units/hr (08/23/13 0206)    Allergies:  Allergies  Allergen Reactions  . Bayer Advanced Aspirin [Aspirin] Palpitations    Patient can tolerate "low dose" aspirin    History   Social History  . Marital Status: Single    Spouse Name: N/A    Number of Children: N/A  . Years of Education: N/A   Occupational  History  . Not on file.   Social History Main Topics  . Smoking status: Never Smoker   . Smokeless tobacco: Not on file  . Alcohol Use: No  . Drug Use: Not on file  . Sexual Activity: No   Other Topics Concern  . Not on file   Social History Narrative  . No narrative on file     No family history on file.   Review of Systems: General: negative for chills, fever, night sweats or weight changes.  Cardiovascular: per HPI  Dermatological: negative for rash Respiratory: negative for cough or wheezing Urologic: negative for hematuria Abdominal: negative for nausea, vomiting, diarrhea, bright red blood per rectum, melena, or hematemesis Neurologic: negative for visual changes, syncope, or dizziness All other systems reviewed and are otherwise negative except as noted  above.  Labs:  Recent Labs  08/22/13 2356 08/23/13 0519  TROPONINI 1.10* 2.07*   Lab Results  Component Value Date   WBC 7.2 08/22/2013   HGB 10.9* 08/22/2013   HCT 32.7* 08/22/2013   MCV 92.6 08/22/2013   PLT 275 08/22/2013    Recent Labs Lab 08/22/13 1850  NA 146  K 4.2  CL 109  CO2 21  BUN 21  CREATININE 1.01  CALCIUM 9.2  PROT 7.1  BILITOT 0.2*  ALKPHOS 79  ALT 9  AST 13  GLUCOSE 108*   No results found for this basename: CHOL, HDL, LDLCALC, TRIG   No results found for this basename: DDIMER   Trop I 0.03--> 1.1-- > 2.07  Radiology/Studies:  Dg Chest 2 View  08/22/2013   CLINICAL DATA:  Central left-sided chest pain.  EXAM: CHEST  2 VIEW  COMPARISON:  None.  FINDINGS: There is opacity at the left lung base. This is consistent with pneumonia or other infiltrate.  Lungs are otherwise clear.  No pleural effusion.  No pneumothorax.  Cardiac silhouette is normal in size. Aorta is mildly uncoiled. No mediastinal or hilar masses.  Bony thorax is demineralized but grossly intact.  IMPRESSION: Left lower lobe infiltrate.   Electronically Signed   By: Lajean Manes M.D.   On: 08/22/2013 19:40    EKG: sinus Loletha Grayer , TWI in inferior leads   Physical Exam: Blood pressure 126/55, pulse 56, temperature 99 F (37.2 C), temperature source Oral, resp. rate 20, height 5\' 8"  (1.727 m), weight 101.833 kg (224 lb 8 oz), SpO2 100.00%. General: Well developed, well nourished, in no acute distress. Head: Normocephalic, atraumatic, sclera non-icteric, no xanthomas, nares are without discharge.  Neck: Negative for carotid bruits. JVD not elevated. Lungs: Clear bilaterally to auscultation without wheezes, rales, or rhonchi. Breathing is unlabored. Heart: RRR with S1 S2. No murmurs, rubs, or gallops appreciated. Abdomen: Soft, non-tender, non-distended with normoactive bowel sounds. No hepatomegaly. No rebound/guarding. No obvious abdominal masses. Msk:  Strength and tone appear normal for  age. Extremities: No clubbing or cyanosis. No edema.  Distal pedal pulses are 2+ and equal bilaterally. Neuro: Alert and oriented X 3. No facial asymmetry. No focal deficit. Moves all extremities spontaneously. Psych:  Responds to questions appropriately with a normal affect.     Assessment and Plan:  NSTEMI  HTN   Plan  Cont tele , aspirin , statin , Heparin , ACE and B-blocker  Obtain 2D echo  Continue to trend CE , will likely need LHC for further eval , keep NPO     Signed, Arnoldo Lenis, A M.D  08/23/2013, 6:40 AM

## 2013-08-23 NOTE — CV Procedure (Signed)
    Cardiac Catheterization Procedure Note  Name: Addysan Gotte MRN: [id number] DOB: Jan 07, 1941  Procedure: Left Heart Cath, Selective Coronary Angiography, LV angiography  Indication: 73 yo BF with history of HTN and hyperlipidemia presents with a NSTEMI and class IV angina.   Procedural Details: The right wrist was prepped, draped, and anesthetized with 1% lidocaine. Using the modified Seldinger technique, a 5 French sheath was introduced into the right radial artery. 3 mg of verapamil was administered through the sheath, weight-based unfractionated heparin was administered intravenously. Standard Judkins catheters were used for selective coronary angiography and left ventriculography. An EZRAD left guide was used to engage the left coronary artery. Catheter exchanges were performed over an exchange length guidewire. There was considerable difficulty accessing the coronaries from wth right radial approach due to marked tortuosity of the innominate artery.There were no immediate procedural complications. A TR band was used for radial hemostasis at the completion of the procedure.  The patient was transferred to the post catheterization recovery area for further monitoring.  Procedural Findings: Hemodynamics: AO 122/57 mean 82 mm Hg LV 124/9 mm Hg  Coronary angiography: Coronary dominance: right  Left mainstem: The left main is very short and essentially a shared ostium for the LAD and LCx. No significant left main disease.  Left anterior descending (LAD): The LAD was incompletely visualized due to the catheter selectively engaging the LCx. By flush shots there appears to be moderate mid LAD disease of approximately 50-60%.  Left circumflex (LCx): There is 40% disease in the proximal and mid LCx. The first OM has 40% ostial disease.   Right coronary artery (RCA): The RCA is a large dominant vessel. There is moderate calcification in the proximal and mid vessel. The proximal vessel is  diffusely diseased up to 50%. The mid vessel has tandem 80% then 90% lesions in a very angulated segment.   Left ventriculography: Left ventricular systolic function is normal, LVEF is estimated at 55-65%, there is no significant mitral regurgitation   Final Conclusions:   1. Single vessel obstructive CAD with complex disease in the proximal to mid RCA. While the LAD was incompletely visualized, it does not appear to have severe disease. 2. Normal LV function.  Recommendations: Recommend PCI of the RCA. Will load with DAPT. Will plan on PCI on 08/24/13 from a femoral approach. I would not use a right radial approach in the future due to extreme tortuosity of the innominate artery and poor catheter engagement. At the time of PCI will relook at the LAD with better catheter support.   Collier Salina [facility] 08/23/2013, 11:41 AM

## 2013-08-24 ENCOUNTER — Other Ambulatory Visit: Payer: Self-pay

## 2013-08-24 ENCOUNTER — Encounter (HOSPITAL_COMMUNITY)
Admission: EM | Disposition: A | Discharge status: Home / Self Care | Payer: Medicare Other | Source: Home / Self Care | Attending: Internal Medicine

## 2013-08-24 ENCOUNTER — Encounter (HOSPITAL_COMMUNITY): Payer: Self-pay | Admitting: General Practice

## 2013-08-24 DIAGNOSIS — I214 Non-ST elevation (NSTEMI) myocardial infarction: Secondary | ICD-10-CM

## 2013-08-24 DIAGNOSIS — I2510 Atherosclerotic heart disease of native coronary artery without angina pectoris: Secondary | ICD-10-CM

## 2013-08-24 HISTORY — PX: PERCUTANEOUS CORONARY STENT INTERVENTION (PCI-S): SHX5485

## 2013-08-24 LAB — CBC
HCT: 32.4 % — ABNORMAL LOW (ref 36.0–46.0)
Hemoglobin: 10.6 g/dL — ABNORMAL LOW (ref 12.0–15.0)
MCH: 30.5 pg (ref 26.0–34.0)
MCHC: 32.7 g/dL (ref 30.0–36.0)
MCV: 93.1 fL (ref 78.0–100.0)
Platelets: 257 10*3/uL (ref 150–400)
RBC: 3.48 MIL/uL — ABNORMAL LOW (ref 3.87–5.11)
RDW: 13.9 % (ref 11.5–15.5)
WBC: 6.1 10*3/uL (ref 4.0–10.5)

## 2013-08-24 LAB — LIPID PANEL
CHOL/HDL RATIO: 5.2 ratio
Cholesterol: 239 mg/dL — ABNORMAL HIGH (ref 0–200)
HDL: 46 mg/dL (ref >39)
LDL Cholesterol: 175 mg/dL — ABNORMAL HIGH (ref 0–99)
TRIGLYCERIDES: 88 mg/dL (ref <150)
VLDL: 18 mg/dL (ref 0–40)

## 2013-08-24 LAB — BASIC METABOLIC PANEL WITH GFR
BUN: 15 mg/dL (ref 6–23)
CO2: 21 meq/L (ref 19–32)
Calcium: 9.5 mg/dL (ref 8.4–10.5)
Chloride: 109 meq/L (ref 96–112)
Creatinine, Ser: 1.15 mg/dL — ABNORMAL HIGH (ref 0.50–1.10)
GFR calc Af Amer: 54 mL/min — ABNORMAL LOW
GFR calc non Af Amer: 46 mL/min — ABNORMAL LOW
Glucose, Bld: 95 mg/dL (ref 70–99)
Potassium: 4.1 meq/L (ref 3.7–5.3)
Sodium: 142 meq/L (ref 137–147)

## 2013-08-24 LAB — POCT ACTIVATED CLOTTING TIME: Activated Clotting Time: 365 seconds

## 2013-08-24 LAB — HEMOGLOBIN A1C
Hgb A1c MFr Bld: 6 % — ABNORMAL HIGH (ref <5.7)
MEAN PLASMA GLUCOSE: 126 mg/dL — AB (ref <117)

## 2013-08-24 SURGERY — PERCUTANEOUS CORONARY STENT INTERVENTION (PCI-S)
Anesthesia: LOCAL

## 2013-08-24 MED ORDER — HEPARIN (PORCINE) IN NACL 2-0.9 UNIT/ML-% IJ SOLN
INTRAMUSCULAR | Status: AC
  Filled 2013-08-24: qty 1000

## 2013-08-24 MED ORDER — FENTANYL CITRATE 0.05 MG/ML IJ SOLN
INTRAMUSCULAR | Status: AC
  Filled 2013-08-24: qty 2

## 2013-08-24 MED ORDER — PNEUMOCOCCAL VAC POLYVALENT 25 MCG/0.5ML IJ INJ
0.5000 mL | INJECTION | INTRAMUSCULAR | Status: AC
  Administered 2013-08-25: 0.5 mL via INTRAMUSCULAR
  Filled 2013-08-24: qty 0.5

## 2013-08-24 MED ORDER — MIDAZOLAM HCL 2 MG/2ML IJ SOLN
INTRAMUSCULAR | Status: AC
  Filled 2013-08-24: qty 2

## 2013-08-24 MED ORDER — TICAGRELOR 90 MG PO TABS
90.0000 mg | ORAL_TABLET | Freq: Two times a day (BID) | ORAL | Status: DC
  Administered 2013-08-24 – 2013-08-25 (×2): 90 mg via ORAL
  Filled 2013-08-24 (×3): qty 1

## 2013-08-24 MED ORDER — BIVALIRUDIN 250 MG IV SOLR
INTRAVENOUS | Status: AC
  Filled 2013-08-24: qty 250

## 2013-08-24 MED ORDER — NITROGLYCERIN 0.2 MG/ML ON CALL CATH LAB
INTRAVENOUS | Status: AC
  Filled 2013-08-24: qty 1

## 2013-08-24 MED ORDER — SODIUM CHLORIDE 0.9 % IV SOLN
1.0000 mL/kg/h | INTRAVENOUS | Status: AC
  Administered 2013-08-24: 1 mL/kg/h via INTRAVENOUS

## 2013-08-24 MED ORDER — LIDOCAINE HCL (PF) 1 % IJ SOLN
INTRAMUSCULAR | Status: AC
  Filled 2013-08-24: qty 30

## 2013-08-24 NOTE — Interval H&P Note (Signed)
History and Physical Interval Note:  08/24/2013 10:00 AM  Shelly [name]  has presented today for surgery, with the diagnosis of Planned PCI of RCA  The various methods of treatment have been discussed with the patient and family. After consideration of risks, benefits and other options for treatment, the patient has consented to  Procedure(s): PERCUTANEOUS CORONARY STENT INTERVENTION (PCI-S) (N/A) as a surgical intervention .  The patient's history has been reviewed, patient examined, no change in status, stable for surgery.  I have reviewed the patient's chart and labs.  Questions were answered to the patient's satisfaction.   Cath Lab Visit (complete for each Cath Lab visit)  Clinical Evaluation Leading to the Procedure:   ACS: yes  Non-ACS:    Anginal Classification: CCS IV  Anti-ischemic medical therapy: Minimal Therapy (1 class of medications)  Non-Invasive Test Results: No non-invasive testing performed  Prior CABG: No previous CABG        Ander Slade [facility] 08/24/2013 10:00 AM

## 2013-08-24 NOTE — Care Management Note (Addendum)
  Page 2 of 2   08/25/2013     10:27:39 AM   CARE MANAGEMENT NOTE 08/25/2013  Patient:  [facility]   Account Number:  [id number]  Date Initiated:  08/24/2013  Documentation initiated by:  Tomi Paddock  Subjective/Objective Assessment:   history of HTN and hyperlipidemia. She presents with a NSTEMI with class IV angina. Diagnostic cardiac cath showed severe disease in the proximal to mid RCA.     Action/Plan:   CM to follow for dispositon needs   Anticipated DC Date:  08/25/2013   Anticipated DC Plan:  [place]  CM consult      Choice offered to / List presented to:             Status of service:  Completed, signed off Medicare Important Message given?   (If response is "NO", the following Medicare IM given date fields will be blank) Date Medicare IM given:   Date Additional Medicare IM given:    Discharge Disposition:  HOME/SELF CARE  Per UR Regulation:  Reviewed for med. necessity/level of care/duration of stay  If discussed at [place] of Stay Meetings, dates discussed:    Comments:  08/25/2013 CM Consult Note: Social:  From home; member active and still working. Family at bedside. CM provided verbal eduction on Brilinta co-pay $45.00 No other CM needs identified. Dispostion:  home / self care Verdia Bolt RN, BSN, MSHL, CCm 08/25/2013   08/24/2013 Benefit Check Update  Note: Brilinta 90 mg BID brilinta is tier 3; $45 at retail; no auth required per rep at [facility] rx: Hx/o Successful DES stenting of the proximal to mid RCA with overlapping stents. Recommendations: Continue dual antiplatelet therapy for one year. Anticipate DC tomorrow. Disposition Plan:  Home / Self care. Lissie Hinesley RN, BSN, [place], [place] 08/24/2013

## 2013-08-24 NOTE — CV Procedure (Signed)
    CARDIAC CATH NOTE  Name: Shelly [name] MRN: [id number] DOB: 1940/08/31  Procedure: PTCA and stenting of the proximal to mid RCA  Indication: 73 yo BF with history of HTN and hyperlipidemia. She presents with a NSTEMI with class IV angina. Diagnostic cardiac cath showed severe disease in the proximal to mid RCA.  Procedural Details: The right groin was prepped, draped, and anesthetized with 1% lidocaine. Using the modified Seldinger technique, a 6 Fr sheath was introduced into the right femoral artery.  Weight-based bivalirudin was given for anticoagulation.  Diagnostic left 3.5 catheter was used to better image the LAD. This demonstrated a 40% stenosis in the mid vessel.  Once a therapeutic ACT was achieved, a 6 [place] LA 0.75 guide catheter was inserted into the RCA.  A Asahi medium coronary guidewire was used to cross the lesion.  The lesion was predilated with a 2.5 mm balloon.  The mid vessel lesion was then stented with a 3.5 x 33 mm Xience stent.  The proximal vessel was then stented with a 4.0 x 28 mm Xience stent in an overlapping fashion. The stent was postdilated with a 4.0 mm noncompliant balloon.  Following PCI, there was 0% residual stenosis and TIMI-3 flow. Final angiography confirmed an excellent result. The patient tolerated the procedure well. There were no immediate procedural complications. Femoral hemostasis was achieved with Manual compression. The patient was transferred to the post catheterization recovery area for further monitoring.  Lesion Data: Vessel: RCA Percent stenosis (pre): 90% TIMI-flow (pre):  3 Stent:  4.0 x 28 Xience and 3.5 x 33 mm Xience stent. Percent stenosis (post): 0% TIMI-flow (post): 3  Conclusions: Successful DES stenting of the proximal to mid RCA with overlapping stents.   Recommendations: Continue dual antiplatelet therapy for one year. Anticipate DC tomorrow.  Ander Slade [facility] 08/24/2013, 11:02 AM

## 2013-08-24 NOTE — Progress Notes (Signed)
PROGRESS NOTE    Shelly [name] [id number] DOB: Nov 11, 1940 DOA: 08/22/2013 PCP: [name], MD  HPI/Brief narrative 73 year old female with history of hypertension and obesity, was admitted on 08/22/13 with complaints of substernal chest pain radiating to left arm and neck, associated with dyspnea and diaphoresis. This was exertional while at work. She ruled in for NSTEMI. She underwent LHC which showed severe single-vessel obstructive CAD complex disease in the proximal to mid RCA-unable to do PCI today and cardiology plans to do PCI on 4/8.   Assessment/Plan:  1. NSTEMI: Status post LHC on 4/7 which showed severe single-vessel obstructive CAD with complex disease in the proximal to mid RCA and normal LV function. Cardiology was unable to do PCI 4/7. Chest pain-free. Continue aspirin, statins, metoprolol & Brilinta. Successful DES stenting of the proximal to mid RCA on 4/8. Continue dual antiplatelet therapy for one year. 2. Hypertension: Controlled. Continue lisinopril and metoprolol 3. Morbid obesity 4. Anemia: Seems chronic and stable. Follow CBC in a.m. 5. ? Abnormal chest x-ray: Reports left lower lobe infiltrate. Patient however denies cough, dyspnea, fever or chills. In the absence of symptoms, signs and no leukocytosis-will not treat with antibiotics. Consider repeating chest x-ray as outpatient   Code Status: Full Family Communication: Discussed with her daughter Disposition Plan: Remains inpatient. Home possibly 4/9   Consultants:  Cardiology  Procedures:  LHC on 4/7  PTCA and stenting of the proximal to mid RCA 4/8  Antibiotics:  None   Subjective: Patient seen prior to cath. Denied chest pain.  Objective: Filed Vitals:   08/24/13 1630 08/24/13 1800 08/24/13 1806 08/24/13 1815  BP: 126/48 129/58 131/49 127/52  Pulse:  79 77 74  Temp:      TempSrc:      Resp:      Height:      Weight:      SpO2:  96% 95% 98%    Intake/Output Summary (Last 24  hours) at 08/24/13 1842 Last data filed at 08/24/13 1134  Gross per 24 hour  Intake  101.8 ml  Output    550 ml  Net -448.2 ml   Filed Weights   08/22/13 1815 08/22/13 2158 08/23/13 0630  Weight: 105.235 kg (232 lb) 101.923 kg (224 lb 11.2 oz) 101.833 kg (224 lb 8 oz)     Exam:  General exam: Elderly female, looks younger than stated age, moderately built and morbidly obese, was sitting comfortably at edge of bed  Respiratory system: Clear. No increased work of breathing. Cardiovascular system: S1 & S2 heard, RRR. No JVD, murmurs, gallops, clicks or pedal edema.  Gastrointestinal system: Abdomen is nondistended, soft and nontender. Normal bowel sounds heard. Central nervous system: Alert and oriented. No focal neurological deficits. Extremities: Symmetric 5 x 5 power.   Data Reviewed: Basic Metabolic Panel:  Recent Labs Lab 08/22/13 1850 08/23/13 1446 08/24/13 0454  NA 146 143 142  K 4.2 4.5 4.1  CL 109 109 109  CO2 21 22 21   GLUCOSE 108* 88 95  BUN 21 18 15   CREATININE 1.01 1.10 1.15*  CALCIUM 9.2 9.3 9.5   Liver Function Tests:  Recent Labs Lab 08/22/13 1850 08/23/13 1446  AST 13 21  ALT 9 9  ALKPHOS 79 74  BILITOT 0.2* 0.3  PROT 7.1 6.4  ALBUMIN 3.3* 2.9*   No results found for this basename: LIPASE, AMYLASE,  in the last 168 hours No results found for this basename: AMMONIA,  in the last 168 hours CBC:  Recent  Labs Lab 08/22/13 1850 08/23/13 1446 08/24/13 0454  WBC 7.2 6.0 6.1  NEUTROABS 5.1  --   --   HGB 10.9* 10.3* 10.6*  HCT 32.7* 31.7* 32.4*  MCV 92.6 93.5 93.1  PLT 275 262 257   Cardiac Enzymes:  Recent Labs Lab 08/22/13 2356 08/23/13 0519  TROPONINI 1.10* 2.07*   BNP (last 3 results) No results found for this basename: PROBNP,  in the last 8760 hours CBG: No results found for this basename: GLUCAP,  in the last 168 hours  No results found for this or any previous visit (from the past 240 hour(s)).    Additional  labs: 1. Fasting lipids: LDL 175 2. Hemoglobin A1c: 6     Studies: Dg Chest 2 View  08/22/2013   CLINICAL DATA:  Central left-sided chest pain.  EXAM: CHEST  2 VIEW  COMPARISON:  None.  FINDINGS: There is opacity at the left lung base. This is consistent with pneumonia or other infiltrate.  Lungs are otherwise clear.  No pleural effusion.  No pneumothorax.  Cardiac silhouette is normal in size. Aorta is mildly uncoiled. No mediastinal or hilar masses.  Bony thorax is demineralized but grossly intact.  IMPRESSION: Left lower lobe infiltrate.   Electronically Signed   By: Lajean Manes M.D.   On: 08/22/2013 19:40        Scheduled Meds: . aspirin EC  81 mg Oral Daily  . atorvastatin  80 mg Oral q1800  . Linaclotide  145 mcg Oral Daily  . lisinopril  20 mg Oral Daily  . metoprolol tartrate  12.5 mg Oral BID  . pantoprazole  40 mg Oral BID  . [START ON 08/25/2013] pneumococcal 23 valent vaccine  0.5 mL Intramuscular Tomorrow-1000  . Ticagrelor  90 mg Oral BID   Continuous Infusions: . sodium chloride 1 mL/kg/hr (08/24/13 1134)    Principal Problem:   Chest pain Active Problems:   Hypertension   GERD (gastroesophageal reflux disease)   Anxiety   NSTEMI (non-ST elevated myocardial infarction)   Anemia    Time spent: 25 minutes    Modena Jansky, MD, FACP, [facility] Pager [phone]  If 7PM-7AM, please contact night-coverage [email or web address] Password [email or web address] 08/24/2013, 6:42 PM    LOS: 2 days

## 2013-08-25 DIAGNOSIS — Z9861 Coronary angioplasty status: Secondary | ICD-10-CM

## 2013-08-25 LAB — CBC
HEMATOCRIT: 32.9 % — AB (ref 36.0–46.0)
HEMOGLOBIN: 11 g/dL — AB (ref 12.0–15.0)
MCH: 31 pg (ref 26.0–34.0)
MCHC: 33.4 g/dL (ref 30.0–36.0)
MCV: 92.7 fL (ref 78.0–100.0)
Platelets: 265 10*3/uL (ref 150–400)
RBC: 3.55 MIL/uL — ABNORMAL LOW (ref 3.87–5.11)
RDW: 13.7 % (ref 11.5–15.5)
WBC: 7.1 10*3/uL (ref 4.0–10.5)

## 2013-08-25 LAB — BASIC METABOLIC PANEL
BUN: 16 mg/dL (ref 6–23)
CALCIUM: 9.4 mg/dL (ref 8.4–10.5)
CO2: 21 mEq/L (ref 19–32)
CREATININE: 1.15 mg/dL — AB (ref 0.50–1.10)
Chloride: 107 mEq/L (ref 96–112)
GFR, EST AFRICAN AMERICAN: 54 mL/min — AB (ref >90)
GFR, EST NON AFRICAN AMERICAN: 46 mL/min — AB (ref >90)
GLUCOSE: 104 mg/dL — AB (ref 70–99)
Potassium: 4.6 mEq/L (ref 3.7–5.3)
Sodium: 142 mEq/L (ref 137–147)

## 2013-08-25 MED ORDER — ATORVASTATIN CALCIUM 80 MG PO TABS: 80.0000 mg | ORAL_TABLET | Freq: Every day | ORAL | Status: DC

## 2013-08-25 MED ORDER — TICAGRELOR 90 MG PO TABS: 90.0000 mg | ORAL_TABLET | Freq: Two times a day (BID) | ORAL | Status: DC

## 2013-08-25 MED ORDER — METOPROLOL TARTRATE 12.5 MG HALF TABLET: 12.5000 mg | ORAL_TABLET | Freq: Two times a day (BID) | ORAL | Status: DC

## 2013-08-25 MED FILL — Sodium Chloride IV Soln 0.9%: INTRAVENOUS | Qty: 50 | Status: AC

## 2013-08-25 NOTE — Progress Notes (Addendum)
Patient Name: Zanari Eisenstein Date of Encounter: 08/25/2013     Principal Problem:   Chest pain Active Problems:   Hypertension   GERD (gastroesophageal reflux disease)   Anxiety   NSTEMI (non-ST elevated myocardial infarction)   Anemia    SUBJECTIVE  Feeling good. A little SOB when she walks around.  CURRENT MEDS . aspirin EC  81 mg Oral Daily  . atorvastatin  80 mg Oral q1800  . Linaclotide  145 mcg Oral Daily  . lisinopril  20 mg Oral Daily  . metoprolol tartrate  12.5 mg Oral BID  . pantoprazole  40 mg Oral BID  . pneumococcal 23 valent vaccine  0.5 mL Intramuscular Tomorrow-1000  . Ticagrelor  90 mg Oral BID    OBJECTIVE  Filed Vitals:   08/24/13 2036 08/24/13 2144 08/24/13 2340 08/25/13 0522  BP: 143/60  145/62 120/52  Pulse: 79 87 82 89  Temp: 98 F (36.7 C)  98.4 F (36.9 C) 98.7 F (37.1 C)  TempSrc: Oral  Oral Oral  Resp: 16  18 20   Height:      Weight:   225 lb 15.5 oz (102.5 kg)   SpO2: 100%  99% 97%    Intake/Output Summary (Last 24 hours) at 08/25/13 0739 Last data filed at 08/25/13 0544  Gross per 24 hour  Intake 1191.8 ml  Output   1400 ml  Net -208.2 ml   Filed Weights   08/22/13 2158 08/23/13 0630 08/24/13 2340  Weight: 224 lb 11.2 oz (101.923 kg) 224 lb 8 oz (101.833 kg) 225 lb 15.5 oz (102.5 kg)    PHYSICAL EXAM  General: Pleasant, NAD. obese Neuro: Alert and oriented X 3. Moves all extremities spontaneously. Psych: Normal affect. HEENT:  Normal  Neck: Supple without bruits or no JVD. Lungs:  Resp regular and unlabored, CTA. Heart: RRR no s3, s4, or murmurs. Abdomen: Soft, non-tender, non-distended, BS + x 4.  Extremities: No clubbing, cyanosis or edema. DP/PT/Radials 2+ and equal bilaterally.  Accessory Clinical Findings  CBC  Recent Labs  08/22/13 1850  08/24/13 0454 08/25/13 0515  WBC 7.2  < > 6.1 7.1  NEUTROABS 5.1  --   --   --   HGB 10.9*  < > 10.6* 11.0*  HCT 32.7*  < > 32.4* 32.9*  MCV 92.6  < > 93.1  92.7  PLT 275  < > 257 265  < > = values in this interval not displayed. Basic Metabolic Panel  Recent Labs  08/24/13 0454 08/25/13 0515  NA 142 142  K 4.1 4.6  CL 109 107  CO2 21 21  GLUCOSE 95 104*  BUN 15 16  CREATININE 1.15* 1.15*  CALCIUM 9.5 9.4   Liver Function Tests  Recent Labs  08/22/13 1850 08/23/13 1446  AST 13 21  ALT 9 9  ALKPHOS 79 74  BILITOT 0.2* 0.3  PROT 7.1 6.4  ALBUMIN 3.3* 2.9*    Cardiac Enzymes  Recent Labs  08/22/13 2356 08/23/13 0519  TROPONINI 1.10* 2.07*   Hemoglobin A1C  Recent Labs  08/23/13 1446  HGBA1C 6.0*   Fasting Lipid Panel  Recent Labs  08/24/13 0454  CHOL 239*  HDL 46  LDLCALC 175*  TRIG 88  CHOLHDL 5.2    TELE  NSR  Radiology/Studies  Dg Chest 2 View  08/22/2013   CLINICAL DATA:  Central left-sided chest pain.  EXAM: CHEST  2 VIEW  COMPARISON:  None.  FINDINGS: There is opacity at the  left lung base. This is consistent with pneumonia or other infiltrate.  Lungs are otherwise clear.  No pleural effusion.  No pneumothorax.  Cardiac silhouette is normal in size. Aorta is mildly uncoiled. No mediastinal or hilar masses.  Bony thorax is demineralized but grossly intact.  IMPRESSION: Left lower lobe infiltrate.   Electronically Signed   By: Lajean Manes M.D.   On: 08/22/2013 19:40    ASSESSMENT AND PLAN  73 yo BF with history of HTN and hyperlipidemia who presented on 09/01/13 with sub-sternal chest pain. She ruled in for NSTEMI and underwent heart cath on 08/23/13  CAD - Cardiac cath on 08/23/13 revealed single vessel obstructive CAD with complex disease in the proximal to mid RCA. While the LAD was incompletely visualized, it did not appear to have severe disease. She had normal LV function. A PCI of the RCA was recommended. She was loaded with DAPT and planned for re-look cath and PCI on 08/24/13 from a femoral approach. Right radial approach in the future was discouraged due to extreme tortuosity of the innominate  artery and poor catheter engagement.  She underwent re-look cath yesterday and she is now s/p successful DES of the prox-mid RCA with overlapping Xience stents.  -- Continue ASA/ Brilinta, BB statin   Hypertension: Controlled. Continue lisinopril and metoprolol   Morbid obesity - encourage dietary compliance  Anemia: stable.  Abnormal chest x-ray: Reports left lower lobe infiltrate. Patient however denies cough, dyspnea, fever or chills. In the absence of symptoms, signs and no leukocytosis-will not treat with antibiotics. Consider repeating chest x-ray as outpatient  Signed, Perry Mount PA-C  Pager [phone]   I have seen and evaluated the patient this AM along with Perry Mount PA-C. I agree with her findings, examination as well as impression recommendations.  Feels much better this AM post complex RCA PCI.  Groin looks ok - had difficult sheath pull.  BP stable. BB/ACE-I ON statin.   Needs CM consult for Brilinta & [facility] consult.  Ok to d/c from cardiology perspective once seen by CM & ambulates with [facility].  Need [facility] consult prior to d/c.  Will ROV with Dr. Martinique.  [facility] to setup.  Leonie Man, M.D., M.S. Interventional Cardiologist  [facility] Pager # [phone] 08/25/2013

## 2013-08-25 NOTE — Discharge Instructions (Signed)

## 2013-08-25 NOTE — Discharge Summary (Signed)
Physician Discharge Summary  Shelly [name] [id number] DOB: April 19, 1941 DOA: 08/22/2013  PCP: Shelly Blitz, MD  Admit date: 08/22/2013 Discharge date: 08/25/2013  Time spent: Less than 30 minutes  Recommendations for Outpatient Follow-up:  1. Shelly [name], Cardiology PA-C on 09/07/13 at 8:50 AM 2. Dr. Monico [name], PCP in one week with repeat labs (CBC & BMP).  3. Consider outpatient further evaluation of reported left lower lobe infiltrate on chest x-ray-repeat chest x-ray in a couple weeks versus CT chest-as per PCP  Discharge Diagnoses:  Principal Problem:   Chest pain Active Problems:   Hypertension   GERD (gastroesophageal reflux disease)   Anxiety   NSTEMI (non-ST elevated myocardial infarction)   Anemia   Discharge Condition: Improved & Stable  Diet recommendation: Heart healthy diet.  Filed Weights   08/22/13 2158 08/23/13 0630 08/24/13 2340  Weight: 101.923 kg (224 lb 11.2 oz) 101.833 kg (224 lb 8 oz) 102.5 kg (225 lb 15.5 oz)    History of present illness:  73 year old female with history of hypertension and obesity, was admitted on 08/22/13 with complaints of substernal chest pain radiating to left arm and neck, associated with dyspnea and diaphoresis. This was exertional while at work. She ruled in for NSTEMI.  Hospital Course:   1. NSTEMI: Cardiac cath on 08/23/13 revealed single vessel obstructive CAD with complex disease in the proximal to mid RCA. While the LAD was incompletely visualized, it did not appear to have severe disease. She had normal LV function. A PCI of the RCA was recommended. She was loaded with DAPT and planned for re-look cath and PCI on 08/24/13 from a femoral approach. Right radial approach in the future was discouraged due to extreme tortuosity of the innominate artery and poor catheter engagement. She underwent re-look cath 4/8 and she is now s/p successful DES of the prox-mid RCA with overlapping Xience stents. Continue ASA/ Brilinta (dual  antiplatelet therapy for one year), BB statin. Patient has been seen by case management for assistance with Brilinta and has been seen by cardiac rehabilitation. Cardiology has cleared her for discharge and have arranged outpatient followup. 2. Hypertension: Controlled. Continue lisinopril and metoprolol 3. Morbid obesity: Will need dietary modification and weight reduction. 4. Anemia: Seems chronic and stable. Outpatient followup. 5. ? Abnormal chest x-ray: Reports left lower lobe infiltrate. Patient however denies cough, dyspnea, fever or chills. In the absence of symptoms, signs and no leukocytosis-will not treat with antibiotics. Consider repeating chest x-ray as outpatient     Consultations:  Cardiology  Procedures:  Left heart cath on 4/7  Relook cath on 4/8 and successful DES of the proximal-mid RCA    Discharge Exam:  Complaints:  Denies complaints. No chest pain  Filed Vitals:   08/24/13 2144 08/24/13 2340 08/25/13 0522 08/25/13 0840  BP:  145/62 120/52 127/70  Pulse: 87 82 89 77  Temp:  98.4 F (36.9 C) 98.7 F (37.1 C) 97.8 F (36.6 C)  TempSrc:  Oral Oral Oral  Resp:  18 20 18   Height:      Weight:  102.5 kg (225 lb 15.5 oz)    SpO2:  99% 97% 98%    General exam: Elderly female, looks younger than stated age, moderately built and morbidly obese, sitting comfortably on chair Respiratory system: Clear. No increased work of breathing.  Cardiovascular system: S1 & S2 heard, RRR. No JVD, murmurs, gallops, clicks or pedal edema. Telemetry: Sinus rhythm Gastrointestinal system: Abdomen is nondistended, soft and nontender. Normal bowel sounds heard.  Central nervous system: Alert and oriented. No focal neurological deficits.  Extremities: Symmetric 5 x 5 power.   Discharge Instructions      Discharge Orders   Future Appointments Provider Department Dept Phone   09/07/2013 8:50 AM Liliane Shi, PA-C [facility] [phone]   Future Orders  Complete By Expires   Amb Referral to Cardiac Rehabilitation  As directed    Call MD for:  severe uncontrolled pain  As directed    Diet - low sodium heart healthy  As directed    Increase activity slowly  As directed        Medication List    STOP taking these medications       diclofenac 75 MG EC tablet  Commonly known as:  VOLTAREN      TAKE these medications       aspirin EC 81 MG tablet  Take 81 mg by mouth daily.     atorvastatin 80 MG tablet  Commonly known as:  LIPITOR  Take 1 tablet (80 mg total) by mouth daily at 6 PM.     CALCIUM 600 + D PO  Take 1 tablet by mouth daily.     Fish Oil 1200 MG Caps  Take 1 capsule by mouth daily.     LINZESS 145 MCG Caps capsule  Generic drug:  Linaclotide  Take 145 mcg by mouth daily.     lisinopril 20 MG tablet  Commonly known as:  PRINIVIL,ZESTRIL  Take 20 mg by mouth daily.     metoprolol tartrate 12.5 mg Tabs tablet  Commonly known as:  LOPRESSOR  Take 0.5 tablets (12.5 mg total) by mouth 2 (two) times daily.     omeprazole 20 MG capsule  Commonly known as:  PRILOSEC  Take 20 mg by mouth daily.     Red Yeast Rice 600 MG Tabs  Take 1 tablet by mouth daily.     SUPER B COMPLEX PO  Take 1 tablet by mouth daily.     Ticagrelor 90 MG Tabs tablet  Commonly known as:  BRILINTA  Take 1 tablet (90 mg total) by mouth 2 (two) times daily.     Vitamin D-3 1000 UNITS Caps  Take 1 capsule by mouth daily.       Follow-up Information   Follow up with [name], PA-C On 09/07/2013. (@ 8:50 am)    Specialty:  Physician Assistant   Contact information:   [id number] [address] [phone]       Follow up with [facility], MD. Schedule an appointment as soon as possible for a visit in 1 week. (To be seen with repeat labs (CBC & BMP))    Specialty:  Internal Medicine   Contact information:   [place] [address] [phone]        The results of significant  diagnostics from this hospitalization (including imaging, microbiology, ancillary and laboratory) are listed below for reference.    Significant Diagnostic Studies: Dg Chest 2 View  08/22/2013   CLINICAL DATA:  Central left-sided chest pain.  EXAM: CHEST  2 VIEW  COMPARISON:  None.  FINDINGS: There is opacity at the left lung base. This is consistent with pneumonia or other infiltrate.  Lungs are otherwise clear.  No pleural effusion.  No pneumothorax.  Cardiac silhouette is normal in size. Aorta is mildly uncoiled. No mediastinal or hilar masses.  Bony thorax is demineralized but grossly intact.  IMPRESSION: Left lower lobe infiltrate.   Electronically Signed   By: Lajean Manes M.D.   On: 08/22/2013 19:40    Microbiology: No results found for this or any previous visit (from the past 240 hour(s)).   Labs: Basic Metabolic Panel:  Recent Labs Lab 08/22/13 1850 08/23/13 1446 08/24/13 0454 08/25/13 0515  NA 146 143 142 142  K 4.2 4.5 4.1 4.6  CL 109 109 109 107  CO2 21 22 21 21   GLUCOSE 108* 88 95 104*  BUN 21 18 15 16   CREATININE 1.01 1.10 1.15* 1.15*  CALCIUM 9.2 9.3 9.5 9.4   Liver Function Tests:  Recent Labs Lab 08/22/13 1850 08/23/13 1446  AST 13 21  ALT 9 9  ALKPHOS 79 74  BILITOT 0.2* 0.3  PROT 7.1 6.4  ALBUMIN 3.3* 2.9*   No results found for this basename: LIPASE, AMYLASE,  in the last 168 hours No results found for this basename: AMMONIA,  in the last 168 hours CBC:  Recent Labs Lab 08/22/13 1850 08/23/13 1446 08/24/13 0454 08/25/13 0515  WBC 7.2 6.0 6.1 7.1  NEUTROABS 5.1  --   --   --   HGB 10.9* 10.3* 10.6* 11.0*  HCT 32.7* 31.7* 32.4* 32.9*  MCV 92.6 93.5 93.1 92.7  PLT 275 262 257 265   Cardiac Enzymes:  Recent Labs Lab 08/22/13 2356 08/23/13 0519  TROPONINI 1.10* 2.07*   BNP: BNP (last 3 results) No results found for this basename: PROBNP,  in the last 8760 hours CBG: No results found for this basename: GLUCAP,  in the last 168  hours  Additional labs: 1. Fasting lipids: LDL 175 2. Hemoglobin A1c: 6 3. 2-D echo 08/23/13: Study Conclusions  - Left ventricle: The cavity size was normal. There was mild concentric hypertrophy. Systolic function was normal. The estimated ejection fraction was in the range of 50% to 55%. Wall motion was normal; there were no regional wall motion abnormalities. Doppler parameters are consistent with abnormal left ventricular relaxation (grade 1 diastolic dysfunction). Doppler parameters are consistent with elevated ventricular end-diastolic filling pressure. - Aortic valve: Trileaflet; mildly thickened, mildly calcified leaflets. No regurgitation. - Aortic root: The aortic root was normal in size. - Mitral valve: Structurally normal valve. Mild regurgitation. - Left atrium: The atrium was moderately dilated. - Right ventricle: The cavity size was normal. Wall thickness was normal. Systolic function was normal. - Right atrium: The atrium was normal in size. - Tricuspid valve: Trivial regurgitation. - Pulmonic valve: No regurgitation. - Pulmonary arteries: Systolic pressure was at the upper limits of normal. PA peak pressure: 44mm Hg (S). - Inferior vena cava: The vessel was normal in size. - Pericardium, extracardiac: There was no pericardial effusion.     Signed:  Modena Jansky, MD, FACP, [facility]. [facility] Pager [phone]  If 7PM-7AM, please contact night-coverage [email or web address] Password [email or web address] 08/25/2013, 11:19 AM

## 2013-08-25 NOTE — Progress Notes (Addendum)
CARDIAC REHAB PHASE I   PRE:  Rate/Rhythm: 95 Sr  BP:  Supine:   Sitting: 122/70  Standing:    SaO2:   MODE:  Ambulation: 500 ft   POST:  Rate/Rhythm: 132 ST  BP:  Supine:   Sitting: 114/64  Standing:    SaO2: 96 RA 0930-1025 Assisted X 1 to ambulate. Gait steady. Pt c/o of left knee pain which she states that she has had for a long time. She was able to walk 500 feet with one standing rest stop. HR after walk 132 ST and she was DOE, room air sat after walk 96%. Pt back to recliner after walk. Completed MI and stent education with pt. She voices understanding. Pt admits to being under so much stress since her daughter died that she just has not taken care of herself.States " I am going to do what I need to to take better care of myself." Pt agrees to [place]. CRP in [place], will send referral. She seems motivated to making lifestyle changes. Reported increased HR after walk to RN.  Rodney Langton RN 08/25/2013 10:26 AM

## 2013-09-06 ENCOUNTER — Encounter: Payer: Self-pay | Admitting: *Deleted

## 2013-09-07 ENCOUNTER — Encounter: Payer: Self-pay | Admitting: Physician Assistant

## 2013-09-07 ENCOUNTER — Ambulatory Visit (INDEPENDENT_AMBULATORY_CARE_PROVIDER_SITE_OTHER): Payer: Medicare Other | Admitting: Physician Assistant

## 2013-09-07 VITALS — BP 140/78 | HR 78 | Ht 68.0 in | Wt 219.0 lb

## 2013-09-07 DIAGNOSIS — I10 Essential (primary) hypertension: Secondary | ICD-10-CM

## 2013-09-07 DIAGNOSIS — K219 Gastro-esophageal reflux disease without esophagitis: Secondary | ICD-10-CM

## 2013-09-07 DIAGNOSIS — R918 Other nonspecific abnormal finding of lung field: Secondary | ICD-10-CM

## 2013-09-07 DIAGNOSIS — I214 Non-ST elevation (NSTEMI) myocardial infarction: Secondary | ICD-10-CM

## 2013-09-07 DIAGNOSIS — E785 Hyperlipidemia, unspecified: Secondary | ICD-10-CM

## 2013-09-07 DIAGNOSIS — I2510 Atherosclerotic heart disease of native coronary artery without angina pectoris: Secondary | ICD-10-CM

## 2013-09-07 DIAGNOSIS — R9389 Abnormal findings on diagnostic imaging of other specified body structures: Secondary | ICD-10-CM

## 2013-09-07 MED ORDER — CLOPIDOGREL BISULFATE 75 MG PO TABS: ORAL_TABLET | ORAL | Status: DC

## 2013-09-07 MED ORDER — NITROGLYCERIN 0.4 MG SL SUBL: 0.4000 mg | SUBLINGUAL_TABLET | SUBLINGUAL | Status: DC | PRN

## 2013-09-07 MED ORDER — PANTOPRAZOLE SODIUM 40 MG PO TBEC: 40.0000 mg | DELAYED_RELEASE_TABLET | Freq: Every day | ORAL | Status: DC

## 2013-09-07 NOTE — Progress Notes (Signed)
[place], [place], [place]  [address] Phone: [phone] Fax:  [phone]  Date:  09/07/2013   ID:  Shelly [name], DOB [birth year], MRN [id number]  PCP:  [name], MD  Cardiologist:  Dr. Peter Martinique - will establish in [place] (closer to her home)   History of Present Illness: Shelly [name] is a 73 y.o. female with a history of HTN. She was admitted for/6-4/9 with a non-STEMI.  Peak troponin was 2.07.  She underwent cardiac catheterization which demonstrated high-grade disease in the mid RCA. There was significant tortuosity in her right radial artery. She was brought back for staged intervention after being loaded with Plavix. She underwent DES x2 to the mid RCA. Chest x-ray suggested left lower lobe infiltrate. However, the patient had no signs or symptoms of pneumonia. She will need follow up with her primary care physician to determine follow up imaging.  She returns for f/u.  She is doing well.  She notes occ dyspnea.  She denies chest pain reminiscent of her prior angina.  She does note hx of epigastric pain that continues.  She notes assoc belching.  Symptoms are assoc with meals as well.  No dysphagia.  She has some occ shoulder and neck pain.  No orthopnea, PND, edema.  She has some chronic dyspnea and is NYHA 2-2b.  No syncope.  She is having trouble affording Brilinta.    Studies:  - LHC (08/22/13):  Mid LAD 50-60, proximal and mid circumflex 40, ostial OM1 40, proximal RCA 50, mid RCA 80, then 90; EF 55-65%.  PCI: Xience (4 x 28 mm and 3.5 x 33 mm) DES x2 to the RCA  - Echo (08/23/13):  Mild LVH, EF 50-55%, normal wall motion, grade 1 diastolic dysfunction, mild MR, moderate LAE, PASP 39 mmHg   Recent Labs: 08/23/2013: ALT 9  08/24/2013: HDL Cholesterol 46; LDL (calc) 175*  08/25/2013: Creatinine 1.15*; Hemoglobin 11.0*; Potassium 4.6   Wt Readings from Last 3 Encounters:  09/07/13 219 lb (99.338 kg)  08/24/13 225 lb 15.5 oz (102.5 kg)  08/24/13 225 lb  15.5 oz (102.5 kg)     Past Medical History  Diagnosis Date  . Goiter   . Hypertension   . Arthritis   . Coronary artery disease     SEVERE  . Anginal pain   . Myocardial infarction 08/2013  . GERD (gastroesophageal reflux disease)     Current Outpatient Prescriptions  Medication Sig Dispense Refill  . aspirin EC 81 MG tablet Take 81 mg by mouth daily.      Marland Kitchen atorvastatin (LIPITOR) 80 MG tablet Take 1 tablet (80 mg total) by mouth daily at 6 PM.  30 tablet  0  . B Complex-C (SUPER B COMPLEX PO) Take 1 tablet by mouth daily.      . Calcium Carb-Cholecalciferol (CALCIUM 600 + D PO) Take 1 tablet by mouth daily.      . Cholecalciferol (VITAMIN D-3) 1000 UNITS CAPS Take 1 capsule by mouth daily.      . Linaclotide (LINZESS) 145 MCG CAPS capsule Take 145 mcg by mouth daily.      Marland Kitchen lisinopril (PRINIVIL,ZESTRIL) 20 MG tablet Take 20 mg by mouth daily.      . metoprolol tartrate (LOPRESSOR) 12.5 mg TABS tablet Take 0.5 tablets (12.5 mg total) by mouth 2 (two) times daily.  60 tablet  0  . Omega-3 Fatty Acids (FISH OIL) 1200 MG CAPS Take 1 capsule by mouth daily.      Marland Kitchen  omeprazole (PRILOSEC) 20 MG capsule Take 20 mg by mouth daily.      . Red Yeast Rice 600 MG TABS Take 1 tablet by mouth daily.      . Ticagrelor (BRILINTA) 90 MG TABS tablet Take 1 tablet (90 mg total) by mouth 2 (two) times daily.  60 tablet  0   No current facility-administered medications for this visit.    Allergies:   Bayer advanced aspirin   Social History:  The patient  reports that she has never smoked. She has never used smokeless tobacco. She reports that she does not drink alcohol or use illicit drugs.   Family History:  The patient's family history is not on file.   ROS:  Please see the history of present illness.      All other systems reviewed and negative.   PHYSICAL EXAM: VS:  BP 140/78  Pulse 78  Ht 5\' 8"  (1.727 m)  Wt 219 lb (99.338 kg)  BMI 33.31 kg/m2 Well nourished, well developed, in no acute  distress HEENT: normal Neck: no JVD Cardiac:  normal S1, S2; RRR; no murmur Lungs:  clear to auscultation bilaterally, no wheezing, rhonchi or rales Abd: soft, nontender, no hepatomegaly Ext: no edemaright wrist without hematoma or mass, right groin without hematoma or bruit  Skin: warm and dry Neuro:  CNs 2-12 intact, no focal abnormalities noted  EKG:  NSR, HR 78, normal axis, nonspecific ST-T wave changes     ASSESSMENT AND PLAN:  1. CAD s/p NSTEMI:  She is doing well.  She is having some atypical chest and epigastric pain.  But, no recurrent anginal symptoms.  She is having trouble affording Brilinta.  I will provide samples for 2 more weeks to get her 1 month out from her event and PCI, then have her switch to Plavix.  She will load with 300 mg on day 1, then 75 mg QD.  Continue ASA, statin, beta blocker.  She is to start cardiac rehab soon at [facility]. 2. GERD:  As we are changing her to Plavix, I will stop Prilosec.  Start Protonix 40 QD.  She has some concerning symptoms.  I have recommended f/u with her PCP.  She may need referral to GI but can review this with her PCP.  3. Hypertension:  Controlled.   4. Hyperlipidemia:  Continue statin.  She will need f/u Lipids and LFTs in 6-8 weeks. 5. Obesity:  She is trying to lose weight.   6. Abnormal CXR:  F/u with PCP for further imaging if needed.   7. Disposition:  F/u with one of our cardiologists in [place] in 6-8 weeks.    Signed, [name], PA-C  09/07/2013 9:05 AM

## 2013-09-07 NOTE — Patient Instructions (Signed)
TAKE BRILINTA FOR 1 MONTH TOTAL THEN YOU WILL SWITCH TO PLAVIX 1ST DOSE OF PLAVIX WILL BE 300 MG THEN STARTING DAY 2 ON PLAVIX YOU WILL BE ON 75 MG DAILY  STOP PRILOSEC  START PROTONIX 40 MG DAILY  AN RX FOR NTG WAS SENT IN AND YOU HAVE BEEN ADVISED AS TO HOW AND WHEN TO USE NTG  PLEASE FOLLOW UP WITH YOUR PRIMARY CARE PHYSICIAN ABOUT GERD AND ABNORMAL CHEST X-RAY  YOU WILL NEED TO FOLLOW UP WITH ONE OF OUR CARDIOLOGIST IN OUR [place] OFFICE IN ABOUT 6 WEEKS AND THAT TIME YOU WILL NEED FASTING LIPID AND LIVER PANEL TO BE DONE AS WELL.

## 2013-09-19 ENCOUNTER — Telehealth: Payer: Self-pay | Admitting: Physician Assistant

## 2013-09-19 NOTE — Telephone Encounter (Signed)
Patient is taking Lipitor & Atorvasatin, she has questions about both medication. Please call and advise.

## 2013-09-19 NOTE — Telephone Encounter (Signed)
cb pt to answer her question she has about lipitor and atorvastatin which is the same medication. I will clarify for her when she call's back.

## 2013-09-20 NOTE — Telephone Encounter (Signed)
lmptcb on both the home # to answer question about the lipitor and atorvastatin. I did lmom that these are the same medication, that Lipitor is Brand Name and Atorvastatin is Generic version. I did state to cb if I can answer anyother questions.

## 2013-09-22 NOTE — Telephone Encounter (Signed)
reached pt today and s/w her about her question about lipitor and atorvastatin, see phone note. I explained to pt that these are both the same medications, atorvastatin is the generic version. Pt then asked about filling her calcium, I advised call PCP.

## 2013-10-04 ENCOUNTER — Telehealth: Payer: Self-pay | Admitting: Physician Assistant

## 2013-10-04 NOTE — Telephone Encounter (Signed)
lmom for Shelia at [place] about pt if needing pre med instructions.

## 2013-10-04 NOTE — Telephone Encounter (Signed)
New message     Patient daughter called today .    Please advise on pre-med before dental work.

## 2013-10-05 NOTE — Telephone Encounter (Signed)
Patient is being changed from Brilinta to Plavix due to cost. She cannot stop Brilint/Plavix or ASA for 1 year. Stopping these medicines prior to one year after ACS increases risk of stent thrombosis. [name], PA-C   10/05/2013 4:46 PM

## 2013-10-05 NOTE — Telephone Encounter (Signed)
I rtnd call to Shelly [name] at [facility] Per [place] pt does not need pre-med before extraction. Shelia asked should pt hold plavix, asa, or brilinta and if so how many days. Said tcb 5/26 after holiday, office will be closed. I said ok

## 2013-10-11 NOTE — Telephone Encounter (Signed)
lmom for Shelly [name] at dental office tcb to discuss per Brynda Rim. PA pt needs to stay on Plavix, ASA for at least 1 year. as well as pt is being changed over from Brilinta to Plavix.

## 2013-10-12 NOTE — Telephone Encounter (Signed)
Shelly [name] cb today from Dr. Raenette Rover office about pt in which I advised per [name], PAC cannot stop Plavix, or ASA due to risk of stent thrombosis. I have faxed over to DR. Sharda, OV note, cath report and phone note for his review.

## 2013-10-14 ENCOUNTER — Telehealth: Payer: Self-pay | Admitting: *Deleted

## 2013-10-14 NOTE — Telephone Encounter (Signed)
Freda Munro from Dr. Burnard Bunting ([facility]) office [phone]; called stating Dr. Burnard Bunting is asking did he think [name], PA would be ok if he could extract 1 tooth with out stopping Plavix or ASA. See previous phone notes. I advised Jacques Navy just left for the day and will be back on Monday. I advised Freda Munro that I am off on Monday but that I will send a note to [facility] in reference to Dr. Raenette Rover request. Freda Munro said that Dr. Burnard Bunting will call in an antibiotic for pt today and let pt know that they will be waiting to hear from [name], PA about this matter.

## 2013-10-17 NOTE — Telephone Encounter (Signed)
Tooth extraction ok. DO NOT stop ASA or Plavix.  Must remain on both for 1 year without stopping.  [name], PA-C   10/17/2013 2:00 PM

## 2013-10-17 NOTE — Telephone Encounter (Signed)
Left message with dental office that per [name] PA she is to stay on ASA and Plavix for 1 year.

## 2013-10-19 ENCOUNTER — Ambulatory Visit (INDEPENDENT_AMBULATORY_CARE_PROVIDER_SITE_OTHER): Payer: Medicare Other | Admitting: Cardiovascular Disease

## 2013-10-19 ENCOUNTER — Encounter: Payer: Self-pay | Admitting: Cardiovascular Disease

## 2013-10-19 VITALS — BP 122/62 | HR 66 | Ht 68.0 in | Wt 218.0 lb

## 2013-10-19 DIAGNOSIS — I2510 Atherosclerotic heart disease of native coronary artery without angina pectoris: Secondary | ICD-10-CM

## 2013-10-19 DIAGNOSIS — E785 Hyperlipidemia, unspecified: Secondary | ICD-10-CM

## 2013-10-19 DIAGNOSIS — Z9861 Coronary angioplasty status: Secondary | ICD-10-CM

## 2013-10-19 DIAGNOSIS — K219 Gastro-esophageal reflux disease without esophagitis: Secondary | ICD-10-CM

## 2013-10-19 DIAGNOSIS — I10 Essential (primary) hypertension: Secondary | ICD-10-CM

## 2013-10-19 DIAGNOSIS — Z955 Presence of coronary angioplasty implant and graft: Secondary | ICD-10-CM

## 2013-10-19 DIAGNOSIS — I214 Non-ST elevation (NSTEMI) myocardial infarction: Secondary | ICD-10-CM

## 2013-10-19 LAB — BASIC METABOLIC PANEL
BUN: 28 mg/dL — AB (ref 6–23)
CHLORIDE: 111 meq/L (ref 96–112)
CO2: 26 mEq/L (ref 19–32)
Calcium: 9.6 mg/dL (ref 8.4–10.5)
Creat: 1.14 mg/dL — ABNORMAL HIGH (ref 0.50–1.10)
Glucose, Bld: 94 mg/dL (ref 70–99)
POTASSIUM: 4.6 meq/L (ref 3.5–5.3)
Sodium: 142 mEq/L (ref 135–145)

## 2013-10-19 LAB — HEPATIC FUNCTION PANEL
ALT: 10 U/L (ref 0–35)
AST: 12 U/L (ref 0–37)
Albumin: 3.6 g/dL (ref 3.5–5.2)
Alkaline Phosphatase: 73 U/L (ref 39–117)
Bilirubin, Direct: 0.1 mg/dL (ref 0.0–0.3)
Indirect Bilirubin: 0.2 mg/dL (ref 0.2–1.2)
TOTAL PROTEIN: 6.9 g/dL (ref 6.0–8.3)
Total Bilirubin: 0.3 mg/dL (ref 0.2–1.2)

## 2013-10-19 LAB — LIPID PANEL
CHOL/HDL RATIO: 4.6 ratio
CHOLESTEROL: 214 mg/dL — AB (ref 0–200)
HDL: 47 mg/dL (ref >39)
LDL Cholesterol: 150 mg/dL — ABNORMAL HIGH (ref 0–99)
Triglycerides: 83 mg/dL (ref <150)
VLDL: 17 mg/dL (ref 0–40)

## 2013-10-19 LAB — CBC
HEMATOCRIT: 33.9 % — AB (ref 36.0–46.0)
Hemoglobin: 11.2 g/dL — ABNORMAL LOW (ref 12.0–15.0)
MCH: 30.2 pg (ref 26.0–34.0)
MCHC: 33 g/dL (ref 30.0–36.0)
MCV: 91.4 fL (ref 78.0–100.0)
PLATELETS: 358 10*3/uL (ref 150–400)
RBC: 3.71 MIL/uL — ABNORMAL LOW (ref 3.87–5.11)
RDW: 14.2 % (ref 11.5–15.5)
WBC: 7.3 10*3/uL (ref 4.0–10.5)

## 2013-10-19 LAB — T4, FREE: Free T4: 1.09 ng/dL (ref 0.80–1.80)

## 2013-10-19 LAB — TSH: TSH: 0.292 u[IU]/mL — ABNORMAL LOW (ref 0.350–4.500)

## 2013-10-19 MED ORDER — METOPROLOL SUCCINATE ER 25 MG PO TB24: 25.0000 mg | ORAL_TABLET | Freq: Every day | ORAL | Status: DC

## 2013-10-19 NOTE — Patient Instructions (Addendum)
Your physician recommends that you schedule a follow-up appointment in: 3 months with Dr Virgina Jock will receive a reminder letter two months in advance reminding you to call and schedule your appointment. If you don't receive this letter, please contact our office.  Your physician recommends that you continue on your current medications as directed. Please refer to the Current Medication list given to you today.  Your physician recommends that you return for lab work in: CBC, BMET, TSH, Free T4, Lipids, LFTs    STart Toprol XL 25 mg daily  Cardiac Diet This diet can help prevent heart disease and stroke. Many factors influence your heart health, including eating and exercise habits. Coronary risk rises a lot with abnormal blood fat (lipid) levels. Cardiac meal planning includes limiting unhealthy fats, increasing healthy fats, and making other small dietary changes. General guidelines are as follows:  Adjust calorie intake to reach and maintain desirable body weight.  Limit total fat intake to less than 30% of total calories. Saturated fat should be less than 7% of calories.  Saturated fats are found in animal products and in some vegetable products. Saturated vegetable fats are found in coconut oil, cocoa butter, palm oil, and palm kernel oil. Read labels carefully to avoid these products as much as possible. Use butter in moderation. Choose tub margarines and oils that have 2 grams of fat or less. Good cooking oils are canola and olive oils.  Practice low-fat cooking techniques. Do not fry food. Instead, broil, bake, boil, steam, grill, roast on a rack, stir-fry, or microwave it. Other fat reducing suggestions include:  Remove the skin from poultry.  Remove all visible fat from meats.  Skim the fat off stews, soups, and gravies before serving them.  Steam vegetables in water or broth instead of sauting them in fat.  Avoid foods with trans fat (or hydrogenated oils), such as  commercially fried foods and commercially baked goods. Commercial shortening and deep-frying fats will contain trans fat.  Increase intake of fruits, vegetables, whole grains, and legumes to replace foods high in fat.  Increase consumption of nuts, legumes, and seeds to at least 4 servings weekly. One serving of a legume equals  cup, and 1 serving of nuts or seeds equals  cup.  Choose whole grains more often. Have 3 servings per day (a serving is 1 ounce [oz]).  Eat 4 to 5 servings of vegetables per day. A serving of vegetables is 1 cup of raw leafy vegetables;  cup of raw or cooked cut-up vegetables;  cup of vegetable juice.  Eat 4 to 5 servings of fruit per day. A serving of fruit is 1 medium whole fruit;  cup of dried fruit;  cup of fresh, frozen, or canned fruit;  cup of 100% fruit juice.  Increase your intake of dietary fiber to 20 to 30 grams per day. Insoluble fiber may help lower your risk of heart disease and may help curb your appetite.  Soluble fiber binds cholesterol to be removed from the blood. Foods high in soluble fiber are dried beans, citrus fruits, oats, apples, bananas, broccoli, Brussels sprouts, and eggplant.  Try to include foods fortified with plant sterols or stanols, such as yogurt, breads, juices, or margarines. Choose several fortified foods to achieve a daily intake of 2 to 3 grams of plant sterols or stanols.  Foods with omega-3 fats can help reduce your risk of heart disease. Aim to have a 3.5 oz portion of fatty fish twice per week, such  as salmon, mackerel, albacore tuna, sardines, lake trout, or herring. If you wish to take a fish oil supplement, choose one that contains 1 gram of both DHA and EPA.  Limit processed meats to 2 servings (3 oz portion) weekly.  Limit the sodium in your diet to 1500 milligrams (mg) per day. If you have high blood pressure, talk to a registered dietitian about a DASH (Dietary Approaches to Stop Hypertension) eating  plan.  Limit sweets and beverages with added sugar, such as soda, to no more than 5 servings per week. One serving is:   1 tablespoon sugar.  1 tablespoon jelly or jam.   cup sorbet.  1 cup lemonade.   cup regular soda. CHOOSING FOODS Starches  Allowed: Breads: All kinds (wheat, rye, raisin, white, oatmeal, [place], [place], and English muffin bread). Low-fat rolls: English muffins, frankfurter and hamburger buns, bagels, pita bread, tortillas (not fried). Pancakes, waffles, biscuits, and muffins made with recommended oil.  Avoid: Products made with saturated or trans fats, oils, or whole milk products. Butter rolls, cheese breads, croissants. Commercial doughnuts, muffins, sweet rolls, biscuits, waffles, pancakes, store-bought mixes. Crackers  Allowed: Low-fat crackers and snacks: Animal, graham, rye, saltine (with recommended oil, no lard), oyster, and matzo crackers. Bread sticks, melba toast, rusks, flatbread, pretzels, and light popcorn.  Avoid: High-fat crackers: cheese crackers, butter crackers, and those made with coconut, palm oil, or trans fat (hydrogenated oils). Buttered popcorn. Cereals  Allowed: Hot or cold whole-grain cereals.  Avoid: Cereals containing coconut, hydrogenated vegetable fat, or animal fat. Potatoes / Pasta / Rice  Allowed: All kinds of potatoes, rice, and pasta (such as macaroni, spaghetti, and noodles).  Avoid: Pasta or rice prepared with cream sauce or high-fat cheese. Chow mein noodles, [place] fries. Vegetables  Allowed: All vegetables and vegetable juices.  Avoid: Fried vegetables. Vegetables in cream, butter, or high-fat cheese sauces. Limit coconut. Fruit in cream or custard. Protein  Allowed: Limit your intake of meat, seafood, and poultry to no more than 6 oz (cooked weight) per day. All lean, well-trimmed beef, veal, pork, and lamb. All chicken and [place] without skin. All fish and shellfish. Wild game: wild duck, rabbit, pheasant, and  venison. Egg whites or low-cholesterol egg substitutes may be used as desired. Meatless dishes: recipes with dried beans, peas, lentils, and tofu (soybean curd). Seeds and nuts: all seeds and most nuts.  Avoid: Prime grade and other heavily marbled and fatty meats, such as short ribs, spare ribs, rib eye roast or steak, frankfurters, sausage, bacon, and high-fat luncheon meats, mutton. Caviar. Commercially fried fish. Domestic duck, goose, venison sausage. Organ meats: liver, gizzard, heart, chitterlings, brains, kidney, sweetbreads. Dairy  Allowed: Low-fat cheeses: nonfat or low-fat cottage cheese (1% or 2% fat), cheeses made with part skim milk, such as mozzarella, farmers, string, or ricotta. (Cheeses should be labeled no more than 2 to 6 grams fat per oz.). Skim (or 1%) milk: liquid, powdered, or evaporated. Buttermilk made with low-fat milk. Drinks made with skim or low-fat milk or cocoa. Chocolate milk or cocoa made with skim or low-fat (1%) milk. Nonfat or low-fat yogurt.  Avoid: Whole milk cheeses, including colby, cheddar, [place], Monterey Jack, [place], [place], [place], American, Swiss, and blue. Creamed cottage cheese, cream cheese. Whole milk and whole milk products, including buttermilk or yogurt made from whole milk, drinks made from whole milk. Condensed milk, evaporated whole milk, and 2% milk. Soups and Combination Foods  Allowed: Low-fat low-sodium soups: broth, dehydrated soups, homemade broth, soups with the  fat removed, homemade cream soups made with skim or low-fat milk. Low-fat spaghetti, lasagna, chili, and Spanish rice if low-fat ingredients and low-fat cooking techniques are used.  Avoid: Cream soups made with whole milk, cream, or high-fat cheese. All other soups. Desserts and Sweets  Allowed: Sherbet, fruit ices, gelatins, meringues, and angel food cake. Homemade desserts with recommended fats, oils, and milk products. Jam, jelly, honey, marmalade, sugars, and syrups.  Pure sugar candy, such as gum drops, hard candy, jelly beans, marshmallows, mints, and small amounts of dark chocolate.  Avoid: Commercially prepared cakes, pies, cookies, frosting, pudding, or mixes for these products. Desserts containing whole milk products, chocolate, coconut, lard, palm oil, or palm kernel oil. Ice cream or ice cream drinks. Candy that contains chocolate, coconut, butter, hydrogenated fat, or unknown ingredients. Buttered syrups. Fats and Oils  Allowed: Vegetable oils: safflower, sunflower, corn, soybean, cottonseed, sesame, canola, olive, or peanut. Non-hydrogenated margarines. Salad dressing or mayonnaise: homemade or commercial, made with a recommended oil. Low or nonfat salad dressing or mayonnaise.  Limit added fats and oils to 6 to 8 tsp per day (includes fats used in cooking, baking, salads, and spreads on bread). Remember to count the "hidden fats" in foods.  Avoid: Solid fats and shortenings: butter, lard, salt pork, bacon drippings. Gravy containing meat fat, shortening, or suet. Cocoa butter, coconut. Coconut oil, palm oil, palm kernel oil, or hydrogenated oils: these ingredients are often used in bakery products, nondairy creamers, whipped toppings, candy, and commercially fried foods. Read labels carefully. Salad dressings made of unknown oils, sour cream, or cheese, such as blue cheese and Roquefort. Cream, all kinds: half-and-half, light, heavy, or whipping. Sour cream or cream cheese (even if "light" or low-fat). Nondairy cream substitutes: coffee creamers and sour cream substitutes made with palm, palm kernel, hydrogenated oils, or coconut oil. Beverages  Allowed: Coffee (regular or decaffeinated), tea. Diet carbonated beverages, mineral water. Alcohol: Check with your caregiver. Moderation is recommended.  Avoid: Whole milk, regular sodas, and juice drinks with added sugar. Condiments  Allowed: All seasonings and condiments. Cocoa powder. "Cream" sauces made  with recommended ingredients.  Avoid: Carob powder made with hydrogenated fats. SAMPLE MENU Breakfast   cup orange juice   cup oatmeal  1 slice toast  1 tsp margarine  1 cup skim milk Lunch  [place] sandwich with 2 oz [place], 2 slices bread  Lettuce and tomato slices  Fresh fruit  Carrot sticks  Coffee or tea Snack  Fresh fruit or low-fat crackers Dinner  3 oz lean ground beef  1 baked potato  1 tsp margarine   cup asparagus  Lettuce salad  1 tbs non-creamy dressing   cup peach slices  1 cup skim milk Document Released: 02/12/2008 Document Revised: 11/04/2011 Document Reviewed: 07/29/2011 Exit[employer] Patient Information 2014 [place], [place].

## 2013-10-19 NOTE — Progress Notes (Signed)
Patient ID: Shelly [name], female   DOB: December 08, 1940, 73 y.o.   MRN: [id number]      SUBJECTIVE: Shelly [name] is a 73 y.o. female with a history of HTN. She was hospitalized in early April 2015 with a non-STEMI. Peak troponin was 2.07. She underwent cardiac catheterization which demonstrated high-grade disease in the mid RCA. There was significant tortuosity in her right radial artery. She was brought back for staged intervention after being loaded with Plavix. She underwent DES x 2 to the mid RCA.  She is doing well. She is here with her daughter. She lives in [place] and works at a group home. She had no symptoms of chest pain prior to her MI. She experienced shortness of breath and diaphoresis. She has gradually been feeling stronger. She sometimes gets some shortness of breath if she is lifting a heavy mop bucket. She is scheduled to begin cardiac rehabilitation on June 15. She denies orthopnea, leg swelling, palpitations, and paroxysmal nocturnal dyspnea.  She has problems with left knee arthritis and it was recommended she undergo left knee replacement. However, she would prefer not to do so and recently received an injection which has alleviated her pain. She experiences night sweats and has done so prior to her MI. Her skin occasionally feels hot, particularly in her hands. She is also scheduled to have some dental work done.  She was initially started on Brilinta but due to cost reasons, this was switched to Plavix.   Studies:  - LHC (08/22/13): Mid LAD 50-60, proximal and mid circumflex 40, ostial OM1 40, proximal RCA 50, mid RCA 80, then 90; EF 55-65%. PCI: Xience (4 x 28 mm and 3.5 x 33 mm) DES x2 to the RCA  - Echo (08/23/13): Mild LVH, EF 50-55%, normal wall motion, grade 1 diastolic dysfunction, mild MR, moderate LAE, PASP 39 mmHg   Recent Labs:  08/23/2013: ALT 9  08/24/2013: HDL Cholesterol 46; LDL (calc) 175*  08/25/2013: Creatinine 1.15*; Hemoglobin 11.0*; Potassium  4.6     Allergies  Allergen Reactions  . Bayer Advanced Aspirin [Aspirin] Palpitations    Patient can tolerate "low dose" aspirin    Current Outpatient Prescriptions  Medication Sig Dispense Refill  . amoxicillin (AMOXIL) 500 MG capsule Take 500 mg by mouth 4 (four) times daily - after meals and at bedtime.      Marland Kitchen aspirin EC 81 MG tablet Take 81 mg by mouth daily.      Marland Kitchen atorvastatin (LIPITOR) 80 MG tablet Take 1 tablet (80 mg total) by mouth daily at 6 PM.  30 tablet  0  . clopidogrel (PLAVIX) 75 MG tablet 1ST DOSE 300 MG ; STARTING DAY 2 DOSE WILL BE 75 MG DAILY  45 tablet  11  . ibuprofen (ADVIL,MOTRIN) 800 MG tablet Take 800 mg by mouth as needed.      . Linaclotide (LINZESS) 145 MCG CAPS capsule Take 145 mcg by mouth daily.      Marland Kitchen lisinopril (PRINIVIL,ZESTRIL) 20 MG tablet Take 20 mg by mouth daily.      . Multiple Vitamins-Minerals (CENTRUM SILVER) tablet Take 1 tablet by mouth daily.      . nitroGLYCERIN (NITROSTAT) 0.4 MG SL tablet Place 1 tablet (0.4 mg total) under the tongue every 5 (five) minutes as needed for chest pain.  25 tablet  3  . pantoprazole (PROTONIX) 40 MG tablet Take 1 tablet (40 mg total) by mouth daily.  30 tablet  3   No current facility-administered  medications for this visit.    Past Medical History  Diagnosis Date  . Goiter   . Hypertension   . Arthritis   . Coronary artery disease     SEVERE  . Anginal pain   . Myocardial infarction 08/2013  . GERD (gastroesophageal reflux disease)     Past Surgical History  Procedure Laterality Date  . Corneal transplant    . Tonsillectomy    . Tubal ligation    . Heel spur excision      History   Social History  . Marital Status: Single    Spouse Name: N/A    Number of Children: N/A  . Years of Education: N/A   Occupational History  . Not on file.   Social History Main Topics  . Smoking status: Never Smoker   . Smokeless tobacco: Never Used  . Alcohol Use: No  . Drug Use: No  . Sexual  Activity: No   Other Topics Concern  . Not on file   Social History Narrative  . No narrative on file     Filed Vitals:   10/19/13 0919  BP: 122/62  Pulse: 66  Height: 5\' 8"  (1.727 m)  Weight: 218 lb (98.884 kg)  SpO2: 98%    PHYSICAL EXAM General: NAD Neck: No JVD, no thyromegaly. Lungs: Clear to auscultation bilaterally with normal respiratory effort. CV: Nondisplaced PMI.  Regular rate and rhythm, normal S1/S2, no S3/S4, no murmur. No pretibial or periankle edema.  No carotid bruit.  Normal pedal pulses.  Abdomen: Soft, nontender, no hepatosplenomegaly, no distention.  Neurologic: Alert and oriented x 3.  Psych: Normal affect. Extremities: No clubbing or cyanosis.   ECG: reviewed and available in electronic records.   Echo (08/23/13): - Left ventricle: The cavity size was normal. There was mild concentric hypertrophy. Systolic function was normal. The estimated ejection fraction was in the range of 50% to 55%. Wall motion was normal; there were no regional wall motion abnormalities. Doppler parameters are consistent with abnormal left ventricular relaxation (grade 1 diastolic dysfunction). Doppler parameters are consistent with elevated ventricular end-diastolic filling pressure. - Aortic valve: Trileaflet; mildly thickened, mildly calcified leaflets. No regurgitation. - Aortic root: The aortic root was normal in size. - Mitral valve: Structurally normal valve. Mild regurgitation. - Left atrium: The atrium was moderately dilated. - Right ventricle: The cavity size was normal. Wall thickness was normal. Systolic function was normal. - Right atrium: The atrium was normal in size. - Tricuspid valve: Trivial regurgitation. - Pulmonic valve: No regurgitation. - Pulmonary arteries: Systolic pressure was at the upper limits of normal. PA peak pressure: 48mm Hg (S). - Inferior vena cava: The vessel was normal in size. - Pericardium, extracardiac: There was no  pericardial effusion.     ASSESSMENT AND PLAN: 1. CAD s/p NSTEMI: She is doing well and is symptomatically stable. She has residual moderate CAD but denies recurrent anginal symptoms. Continue ASA, statin, and Plavix. Her metoprolol prescription ran out. I will initiate Toprol-XL 25 mg daily. She is to start cardiac rehab on 6/15 at [facility]. 2. GERD: Continue Protonix 40 mg daily. 3. Hypertension: Controlled on current therapy which includes lisinopril 20 mg daily.  4. Hyperlipidemia: Continue atorvastatin. She will need f/u lipids and LFTs in the near future. 5. Obesity: She is trying to lose weight. Cardiac rehab will help with this.  Dispo: f/u 3 months.  [name], M.D., F.A.C.C.

## 2013-11-01 ENCOUNTER — Encounter (HOSPITAL_COMMUNITY): Payer: Medicare Other

## 2013-12-17 DIAGNOSIS — K5730 Diverticulosis of large intestine without perforation or abscess without bleeding: Secondary | ICD-10-CM

## 2013-12-17 DIAGNOSIS — K922 Gastrointestinal hemorrhage, unspecified: Secondary | ICD-10-CM

## 2013-12-17 HISTORY — DX: Diverticulosis of large intestine without perforation or abscess without bleeding: K57.30

## 2013-12-17 HISTORY — DX: Gastrointestinal hemorrhage, unspecified: K92.2

## 2014-01-09 ENCOUNTER — Telehealth: Payer: Self-pay | Admitting: Cardiology

## 2014-01-09 NOTE — Telephone Encounter (Signed)
Regular patient of Dr Bronson Ing who is out of office today. Contacted by Dr Manuella Ghazi, patient has been having black stools. From chart review DES x 2 to RCA 08/2013, has been on DAPT with ASA and plavix. CBC and stool studies pending. Recommendation to continue DAPT at this time until further information collected from stool studies and CBC, 4 months after DES placement still higher risk for instent thrombosis. Will forward info to Dr Bronson Ing, they are to update [place] on test results   Zandra Abts MD

## 2014-01-10 ENCOUNTER — Encounter (HOSPITAL_COMMUNITY): Payer: Self-pay | Admitting: Emergency Medicine

## 2014-01-10 ENCOUNTER — Inpatient Hospital Stay (HOSPITAL_COMMUNITY)
Admission: EM | Admit: 2014-01-10 | Discharge: 2014-01-13 | DRG: 378 | Disposition: A | Discharge status: Home / Self Care | Payer: Medicare Other | Attending: Internal Medicine | Admitting: Internal Medicine

## 2014-01-10 DIAGNOSIS — Z6831 Body mass index (BMI) 31.0-31.9, adult: Secondary | ICD-10-CM

## 2014-01-10 DIAGNOSIS — Z9861 Coronary angioplasty status: Secondary | ICD-10-CM

## 2014-01-10 DIAGNOSIS — D649 Anemia, unspecified: Secondary | ICD-10-CM

## 2014-01-10 DIAGNOSIS — I252 Old myocardial infarction: Secondary | ICD-10-CM

## 2014-01-10 DIAGNOSIS — E663 Overweight: Secondary | ICD-10-CM | POA: Diagnosis present

## 2014-01-10 DIAGNOSIS — K5731 Diverticulosis of large intestine without perforation or abscess with bleeding: Secondary | ICD-10-CM | POA: Diagnosis not present

## 2014-01-10 DIAGNOSIS — I10 Essential (primary) hypertension: Secondary | ICD-10-CM | POA: Diagnosis present

## 2014-01-10 DIAGNOSIS — K219 Gastro-esophageal reflux disease without esophagitis: Secondary | ICD-10-CM | POA: Diagnosis present

## 2014-01-10 DIAGNOSIS — I2510 Atherosclerotic heart disease of native coronary artery without angina pectoris: Secondary | ICD-10-CM | POA: Diagnosis present

## 2014-01-10 DIAGNOSIS — D62 Acute posthemorrhagic anemia: Secondary | ICD-10-CM | POA: Diagnosis present

## 2014-01-10 DIAGNOSIS — K625 Hemorrhage of anus and rectum: Secondary | ICD-10-CM | POA: Diagnosis present

## 2014-01-10 DIAGNOSIS — I214 Non-ST elevation (NSTEMI) myocardial infarction: Secondary | ICD-10-CM

## 2014-01-10 DIAGNOSIS — Z7902 Long term (current) use of antithrombotics/antiplatelets: Secondary | ICD-10-CM

## 2014-01-10 DIAGNOSIS — Z7982 Long term (current) use of aspirin: Secondary | ICD-10-CM

## 2014-01-10 DIAGNOSIS — K921 Melena: Secondary | ICD-10-CM | POA: Diagnosis not present

## 2014-01-10 DIAGNOSIS — I129 Hypertensive chronic kidney disease with stage 1 through stage 4 chronic kidney disease, or unspecified chronic kidney disease: Secondary | ICD-10-CM | POA: Diagnosis present

## 2014-01-10 DIAGNOSIS — F411 Generalized anxiety disorder: Secondary | ICD-10-CM | POA: Diagnosis present

## 2014-01-10 DIAGNOSIS — E669 Obesity, unspecified: Secondary | ICD-10-CM | POA: Diagnosis present

## 2014-01-10 DIAGNOSIS — N183 Chronic kidney disease, stage 3 unspecified: Secondary | ICD-10-CM | POA: Diagnosis present

## 2014-01-10 DIAGNOSIS — K5900 Constipation, unspecified: Secondary | ICD-10-CM | POA: Diagnosis present

## 2014-01-10 DIAGNOSIS — D509 Iron deficiency anemia, unspecified: Secondary | ICD-10-CM | POA: Diagnosis present

## 2014-01-10 DIAGNOSIS — F419 Anxiety disorder, unspecified: Secondary | ICD-10-CM

## 2014-01-10 DIAGNOSIS — K922 Gastrointestinal hemorrhage, unspecified: Secondary | ICD-10-CM

## 2014-01-10 DIAGNOSIS — K2961 Other gastritis with bleeding: Secondary | ICD-10-CM

## 2014-01-10 DIAGNOSIS — I25810 Atherosclerosis of coronary artery bypass graft(s) without angina pectoris: Secondary | ICD-10-CM

## 2014-01-10 HISTORY — DX: Gastrointestinal hemorrhage, unspecified: K92.2

## 2014-01-10 LAB — COMPREHENSIVE METABOLIC PANEL
ALT: 14 U/L (ref 0–35)
ANION GAP: 12 (ref 5–15)
AST: 19 U/L (ref 0–37)
Albumin: 3.2 g/dL — ABNORMAL LOW (ref 3.5–5.2)
Alkaline Phosphatase: 82 U/L (ref 39–117)
BUN: 43 mg/dL — AB (ref 6–23)
CALCIUM: 9.5 mg/dL (ref 8.4–10.5)
CO2: 21 mEq/L (ref 19–32)
Chloride: 109 mEq/L (ref 96–112)
Creatinine, Ser: 1.49 mg/dL — ABNORMAL HIGH (ref 0.50–1.10)
GFR calc non Af Amer: 34 mL/min — ABNORMAL LOW (ref >90)
GFR, EST AFRICAN AMERICAN: 39 mL/min — AB (ref >90)
Glucose, Bld: 140 mg/dL — ABNORMAL HIGH (ref 70–99)
Potassium: 4.5 mEq/L (ref 3.7–5.3)
SODIUM: 142 meq/L (ref 137–147)
Total Bilirubin: <0.2 mg/dL — ABNORMAL LOW (ref 0.3–1.2)
Total Protein: 7 g/dL (ref 6.0–8.3)

## 2014-01-10 LAB — CBC
HCT: 26 % — ABNORMAL LOW (ref 36.0–46.0)
HEMOGLOBIN: 8.6 g/dL — AB (ref 12.0–15.0)
MCH: 30.2 pg (ref 26.0–34.0)
MCHC: 33.1 g/dL (ref 30.0–36.0)
MCV: 91.2 fL (ref 78.0–100.0)
Platelets: 291 10*3/uL (ref 150–400)
RBC: 2.85 MIL/uL — ABNORMAL LOW (ref 3.87–5.11)
RDW: 14 % (ref 11.5–15.5)
WBC: 8.5 10*3/uL (ref 4.0–10.5)

## 2014-01-10 NOTE — ED Notes (Signed)
Pt reports dark red stools x 3 days. Denies dizziness/lightheadedness. Denies any pain. Pt currently taking Plavix. Pt is AO x4.

## 2014-01-11 ENCOUNTER — Encounter (HOSPITAL_COMMUNITY): Payer: Self-pay | Admitting: General Practice

## 2014-01-11 DIAGNOSIS — I2510 Atherosclerotic heart disease of native coronary artery without angina pectoris: Secondary | ICD-10-CM | POA: Diagnosis present

## 2014-01-11 DIAGNOSIS — D62 Acute posthemorrhagic anemia: Secondary | ICD-10-CM | POA: Diagnosis present

## 2014-01-11 DIAGNOSIS — Z6831 Body mass index (BMI) 31.0-31.9, adult: Secondary | ICD-10-CM | POA: Diagnosis not present

## 2014-01-11 DIAGNOSIS — K921 Melena: Secondary | ICD-10-CM | POA: Diagnosis present

## 2014-01-11 DIAGNOSIS — N183 Chronic kidney disease, stage 3 unspecified: Secondary | ICD-10-CM | POA: Diagnosis present

## 2014-01-11 DIAGNOSIS — I129 Hypertensive chronic kidney disease with stage 1 through stage 4 chronic kidney disease, or unspecified chronic kidney disease: Secondary | ICD-10-CM | POA: Diagnosis present

## 2014-01-11 DIAGNOSIS — K5731 Diverticulosis of large intestine without perforation or abscess with bleeding: Secondary | ICD-10-CM | POA: Diagnosis present

## 2014-01-11 DIAGNOSIS — K5900 Constipation, unspecified: Secondary | ICD-10-CM | POA: Diagnosis present

## 2014-01-11 DIAGNOSIS — K625 Hemorrhage of anus and rectum: Secondary | ICD-10-CM | POA: Diagnosis present

## 2014-01-11 DIAGNOSIS — D509 Iron deficiency anemia, unspecified: Secondary | ICD-10-CM

## 2014-01-11 DIAGNOSIS — K922 Gastrointestinal hemorrhage, unspecified: Secondary | ICD-10-CM

## 2014-01-11 DIAGNOSIS — I25810 Atherosclerosis of coronary artery bypass graft(s) without angina pectoris: Secondary | ICD-10-CM

## 2014-01-11 DIAGNOSIS — Z7982 Long term (current) use of aspirin: Secondary | ICD-10-CM | POA: Diagnosis not present

## 2014-01-11 DIAGNOSIS — K2991 Gastroduodenitis, unspecified, with bleeding: Secondary | ICD-10-CM

## 2014-01-11 DIAGNOSIS — I252 Old myocardial infarction: Secondary | ICD-10-CM | POA: Diagnosis not present

## 2014-01-11 DIAGNOSIS — D649 Anemia, unspecified: Secondary | ICD-10-CM

## 2014-01-11 DIAGNOSIS — Z9861 Coronary angioplasty status: Secondary | ICD-10-CM | POA: Diagnosis not present

## 2014-01-11 DIAGNOSIS — E663 Overweight: Secondary | ICD-10-CM | POA: Diagnosis present

## 2014-01-11 DIAGNOSIS — Z7902 Long term (current) use of antithrombotics/antiplatelets: Secondary | ICD-10-CM | POA: Diagnosis not present

## 2014-01-11 DIAGNOSIS — E669 Obesity, unspecified: Secondary | ICD-10-CM | POA: Diagnosis present

## 2014-01-11 DIAGNOSIS — I10 Essential (primary) hypertension: Secondary | ICD-10-CM

## 2014-01-11 DIAGNOSIS — K219 Gastro-esophageal reflux disease without esophagitis: Secondary | ICD-10-CM | POA: Diagnosis present

## 2014-01-11 DIAGNOSIS — K2971 Gastritis, unspecified, with bleeding: Secondary | ICD-10-CM

## 2014-01-11 DIAGNOSIS — F411 Generalized anxiety disorder: Secondary | ICD-10-CM | POA: Diagnosis present

## 2014-01-11 LAB — CBC
HCT: 26.9 % — ABNORMAL LOW (ref 36.0–46.0)
HCT: 22.7 % — ABNORMAL LOW (ref 36.0–46.0)
HEMOGLOBIN: 7.5 g/dL — AB (ref 12.0–15.0)
Hemoglobin: 8.7 g/dL — ABNORMAL LOW (ref 12.0–15.0)
MCH: 29.7 pg (ref 26.0–34.0)
MCH: 30.2 pg (ref 26.0–34.0)
MCHC: 32.3 g/dL (ref 30.0–36.0)
MCHC: 33 g/dL (ref 30.0–36.0)
MCV: 91.8 fL (ref 78.0–100.0)
MCV: 91.5 fL (ref 78.0–100.0)
Platelets: 293 K/uL (ref 150–400)
Platelets: 244 10*3/uL (ref 150–400)
RBC: 2.93 MIL/uL — ABNORMAL LOW (ref 3.87–5.11)
RBC: 2.48 MIL/uL — ABNORMAL LOW (ref 3.87–5.11)
RDW: 14.3 % (ref 11.5–15.5)
RDW: 14.2 % (ref 11.5–15.5)
WBC: 9.7 K/uL (ref 4.0–10.5)
WBC: 7.1 10*3/uL (ref 4.0–10.5)

## 2014-01-11 LAB — ABO/RH: ABO/RH(D): O POS

## 2014-01-11 LAB — HEMOGLOBIN AND HEMATOCRIT, BLOOD
HCT: 26.6 % — ABNORMAL LOW (ref 36.0–46.0)
HEMATOCRIT: 23.9 % — AB (ref 36.0–46.0)
HEMATOCRIT: 27.5 % — AB (ref 36.0–46.0)
HEMOGLOBIN: 7.9 g/dL — AB (ref 12.0–15.0)
HEMOGLOBIN: 9.1 g/dL — AB (ref 12.0–15.0)
Hemoglobin: 9.5 g/dL — ABNORMAL LOW (ref 12.0–15.0)

## 2014-01-11 LAB — BASIC METABOLIC PANEL
Anion gap: 10 (ref 5–15)
BUN: 43 mg/dL — AB (ref 6–23)
CO2: 21 mEq/L (ref 19–32)
Calcium: 9 mg/dL (ref 8.4–10.5)
Chloride: 112 mEq/L (ref 96–112)
Creatinine, Ser: 1.37 mg/dL — ABNORMAL HIGH (ref 0.50–1.10)
GFR calc Af Amer: 43 mL/min — ABNORMAL LOW (ref >90)
GFR, EST NON AFRICAN AMERICAN: 38 mL/min — AB (ref >90)
GLUCOSE: 112 mg/dL — AB (ref 70–99)
Potassium: 3.9 mEq/L (ref 3.7–5.3)
Sodium: 143 mEq/L (ref 137–147)

## 2014-01-11 LAB — PREPARE RBC (CROSSMATCH)

## 2014-01-11 LAB — OCCULT BLOOD, POC DEVICE: Fecal Occult Bld: POSITIVE — AB

## 2014-01-11 MED ORDER — ACETAMINOPHEN 325 MG PO TABS: 650.0000 mg | ORAL_TABLET | Freq: Four times a day (QID) | ORAL | Status: DC | PRN

## 2014-01-11 MED ORDER — SODIUM CHLORIDE 0.9 % IJ SOLN
3.0000 mL | Freq: Two times a day (BID) | INTRAMUSCULAR | Status: DC
  Administered 2014-01-11 – 2014-01-13 (×4): 3 mL via INTRAVENOUS

## 2014-01-11 MED ORDER — ONDANSETRON HCL 4 MG/2ML IJ SOLN: 4.0000 mg | Freq: Four times a day (QID) | INTRAMUSCULAR | Status: DC | PRN

## 2014-01-11 MED ORDER — ACETAMINOPHEN 650 MG RE SUPP: 650.0000 mg | Freq: Four times a day (QID) | RECTAL | Status: DC | PRN

## 2014-01-11 MED ORDER — ATORVASTATIN CALCIUM 80 MG PO TABS
80.0000 mg | ORAL_TABLET | Freq: Every day | ORAL | Status: DC
  Administered 2014-01-11 – 2014-01-12 (×2): 80 mg via ORAL
  Filled 2014-01-11 (×3): qty 1

## 2014-01-11 MED ORDER — SODIUM CHLORIDE 0.9 % IV SOLN
INTRAVENOUS | Status: DC
  Administered 2014-01-11: 1000 mL via INTRAVENOUS

## 2014-01-11 MED ORDER — METOPROLOL SUCCINATE ER 25 MG PO TB24
25.0000 mg | ORAL_TABLET | Freq: Every day | ORAL | Status: DC
  Administered 2014-01-11 – 2014-01-13 (×3): 25 mg via ORAL
  Filled 2014-01-11 (×3): qty 1

## 2014-01-11 MED ORDER — PANTOPRAZOLE SODIUM 40 MG IV SOLR
40.0000 mg | Freq: Two times a day (BID) | INTRAVENOUS | Status: DC
  Administered 2014-01-11 – 2014-01-13 (×6): 40 mg via INTRAVENOUS
  Filled 2014-01-11 (×7): qty 40

## 2014-01-11 MED ORDER — SODIUM CHLORIDE 0.9 % IV SOLN
INTRAVENOUS | Status: DC
  Administered 2014-01-11: 05:00:00 via INTRAVENOUS

## 2014-01-11 MED ORDER — SODIUM CHLORIDE 0.9 % IV SOLN: Freq: Once | INTRAVENOUS | Status: DC

## 2014-01-11 MED ORDER — ONDANSETRON HCL 4 MG PO TABS: 4.0000 mg | ORAL_TABLET | Freq: Four times a day (QID) | ORAL | Status: DC | PRN

## 2014-01-11 MED ORDER — OXYCODONE HCL 5 MG PO TABS
5.0000 mg | ORAL_TABLET | ORAL | Status: DC | PRN
  Administered 2014-01-13: 5 mg via ORAL
  Filled 2014-01-11: qty 1

## 2014-01-11 MED ORDER — ALUM & MAG HYDROXIDE-SIMETH 200-200-20 MG/5ML PO SUSP: 30.0000 mL | Freq: Four times a day (QID) | ORAL | Status: DC | PRN

## 2014-01-11 MED ORDER — HYDROMORPHONE HCL PF 1 MG/ML IJ SOLN: 0.5000 mg | INTRAMUSCULAR | Status: DC | PRN

## 2014-01-11 MED ORDER — SODIUM CHLORIDE 0.9 % IV SOLN
Freq: Once | INTRAVENOUS | Status: AC
  Administered 2014-01-11: 250 mL via INTRAVENOUS

## 2014-01-11 MED ORDER — LISINOPRIL 20 MG PO TABS
20.0000 mg | ORAL_TABLET | Freq: Every day | ORAL | Status: DC
  Administered 2014-01-11 – 2014-01-13 (×3): 20 mg via ORAL
  Filled 2014-01-11 (×3): qty 1

## 2014-01-11 MED ORDER — NITROGLYCERIN 0.4 MG SL SUBL: 0.4000 mg | SUBLINGUAL_TABLET | SUBLINGUAL | Status: DC | PRN

## 2014-01-11 NOTE — Consult Note (Signed)
UNASSIGNED PATIENT Reason for Consult: Rectal bleeding and anemia. Referring Physician: THP  LELIANA KONTZ is an 73 y.o. female.  HPI: 73 year old black female, with multiple medical issues listed below, on Plavix, Aspirin and Naprosyn at home, was in her USOH till about 4 days ago, when she started passing BRBPR. She claims she had a colonoscopy about 15 years ago that was reportedly normal. She cannot remember the name of her gastroenterologist. She has had problems with constipation intermittently. She denies having any associated abdominal pain, nausea, vomiting, melena, fever or chills. She has felt weak but denies having any syncope or near syncope. There is no known family history of colon cancer.   Past Medical History  Diagnosis Date  . Goiter   . Hypertension   . Arthritis   . Coronary artery disease     SEVERE  . Anginal pain   . Myocardial infarction 08/2013  . GERD (gastroesophageal reflux disease)    Past Surgical History  Procedure Laterality Date  . Corneal transplant    . Tonsillectomy    . Tubal ligation    . Heel spur excision    . Coronary angioplasty  08/2013   History reviewed. No pertinent family history.  Social History:  reports that she has never smoked. She has never used smokeless tobacco. She reports that she does not drink alcohol or use illicit drugs.  Allergies:  Allergies  Allergen Reactions  . Bayer Advanced Aspirin [Aspirin] Palpitations    Patient can tolerate "low dose" aspirin   Medications: I have reviewed the patient's current medications.  Results for orders placed during the hospital encounter of 01/10/14 (from the past 48 hour(s))  CBC     Status: Abnormal   Collection Time    01/10/14  9:46 PM      Result Value Ref Range   WBC 8.5  4.0 - 10.5 K/uL   RBC 2.85 (*) 3.87 - 5.11 MIL/uL   Hemoglobin 8.6 (*) 12.0 - 15.0 g/dL   HCT 26.0 (*) 36.0 - 46.0 %   MCV 91.2  78.0 - 100.0 fL   MCH 30.2  26.0 - 34.0 pg   MCHC 33.1  30.0 -  36.0 g/dL   RDW 14.0  11.5 - 15.5 %   Platelets 291  150 - 400 K/uL  COMPREHENSIVE METABOLIC PANEL     Status: Abnormal   Collection Time    01/10/14  9:46 PM      Result Value Ref Range   Sodium 142  137 - 147 mEq/L   Potassium 4.5  3.7 - 5.3 mEq/L   Chloride 109  96 - 112 mEq/L   CO2 21  19 - 32 mEq/L   Glucose, Bld 140 (*) 70 - 99 mg/dL   BUN 43 (*) 6 - 23 mg/dL   Creatinine, Ser 1.49 (*) 0.50 - 1.10 mg/dL   Calcium 9.5  8.4 - 10.5 mg/dL   Total Protein 7.0  6.0 - 8.3 g/dL   Albumin 3.2 (*) 3.5 - 5.2 g/dL   AST 19  0 - 37 U/L   ALT 14  0 - 35 U/L   Alkaline Phosphatase 82  39 - 117 U/L   Total Bilirubin <0.2 (*) 0.3 - 1.2 mg/dL   GFR calc non Af Amer 34 (*) >90 mL/min   GFR calc Af Amer 39 (*) >90 mL/min   Comment: (NOTE)     The eGFR has been calculated using the CKD EPI equation.  This calculation has not been validated in all clinical situations.     eGFR's persistently <90 mL/min signify possible Chronic Kidney     Disease.   Anion gap 12  5 - 15  OCCULT BLOOD, POC DEVICE     Status: Abnormal   Collection Time    01/11/14 12:19 AM      Result Value Ref Range   Fecal Occult Bld POSITIVE (*) NEGATIVE  TYPE AND SCREEN     Status: None   Collection Time    01/11/14  1:31 AM      Result Value Ref Range   ABO/RH(D) O POS     Antibody Screen NEG     Sample Expiration 01/14/2014     Unit Number [id number]     Blood Component Type RED CELLS,LR     Unit division 00     Status of Unit ISSUED     Transfusion Status OK TO TRANSFUSE     Crossmatch Result Compatible     Unit Number [id number]     Blood Component Type RED CELLS,LR     Unit division 00     Status of Unit ISSUED     Transfusion Status OK TO TRANSFUSE     Crossmatch Result Compatible    ABO/RH     Status: None   Collection Time    01/11/14  1:31 AM      Result Value Ref Range   ABO/RH(D) O POS    CBC     Status: Abnormal   Collection Time    01/11/14  1:44 AM      Result Value Ref Range    WBC 9.7  4.0 - 10.5 K/uL   RBC 2.93 (*) 3.87 - 5.11 MIL/uL   Hemoglobin 8.7 (*) 12.0 - 15.0 g/dL   HCT 26.9 (*) 36.0 - 46.0 %   MCV 91.8  78.0 - 100.0 fL   MCH 29.7  26.0 - 34.0 pg   MCHC 32.3  30.0 - 36.0 g/dL   RDW 14.3  11.5 - 15.5 %   Platelets 293  150 - 400 K/uL  HEMOGLOBIN AND HEMATOCRIT, BLOOD     Status: Abnormal   Collection Time    01/11/14  3:51 AM      Result Value Ref Range   Hemoglobin 7.9 (*) 12.0 - 15.0 g/dL   HCT 23.9 (*) 36.0 - 33.0 %  BASIC METABOLIC PANEL     Status: Abnormal   Collection Time    01/11/14  5:36 AM      Result Value Ref Range   Sodium 143  137 - 147 mEq/L   Potassium 3.9  3.7 - 5.3 mEq/L   Chloride 112  96 - 112 mEq/L   CO2 21  19 - 32 mEq/L   Glucose, Bld 112 (*) 70 - 99 mg/dL   BUN 43 (*) 6 - 23 mg/dL   Creatinine, Ser 1.37 (*) 0.50 - 1.10 mg/dL   Calcium 9.0  8.4 - 10.5 mg/dL   GFR calc non Af Amer 38 (*) >90 mL/min   GFR calc Af Amer 43 (*) >90 mL/min   Comment: (NOTE)     The eGFR has been calculated using the CKD EPI equation.     This calculation has not been validated in all clinical situations.     eGFR's persistently <90 mL/min signify possible Chronic Kidney     Disease.   Anion gap 10  5 - 15  CBC  Status: Abnormal   Collection Time    01/11/14  5:36 AM      Result Value Ref Range   WBC 7.1  4.0 - 10.5 K/uL   RBC 2.48 (*) 3.87 - 5.11 MIL/uL   Hemoglobin 7.5 (*) 12.0 - 15.0 g/dL   HCT 22.7 (*) 36.0 - 46.0 %   MCV 91.5  78.0 - 100.0 fL   MCH 30.2  26.0 - 34.0 pg   MCHC 33.0  30.0 - 36.0 g/dL   RDW 14.2  11.5 - 15.5 %   Platelets 244  150 - 400 K/uL  PREPARE RBC (CROSSMATCH)     Status: None   Collection Time    01/11/14  7:00 AM      Result Value Ref Range   Order Confirmation ORDER PROCESSED BY BLOOD BANK    PREPARE RBC (CROSSMATCH)     Status: None   Collection Time    01/11/14  7:38 AM      Result Value Ref Range   Order Confirmation       Value: ORDER PROCESSED BY BLOOD BANK BB SAMPLE OR UNITS ALREADY  AVAILABLE  HEMOGLOBIN AND HEMATOCRIT, BLOOD     Status: Abnormal   Collection Time    01/11/14  4:25 PM      Result Value Ref Range   Hemoglobin 9.5 (*) 12.0 - 15.0 g/dL   Comment: REPEATED TO VERIFY     POST TRANSFUSION SPECIMEN   HCT 27.5 (*) 36.0 - 46.0 %   No results found.  Review of Systems  Constitutional: Negative.   HENT: Negative.   Eyes: Negative.   Respiratory: Negative.   Cardiovascular: Negative.   Gastrointestinal: Positive for heartburn, constipation and blood in stool. Negative for nausea, vomiting, abdominal pain and diarrhea.  Genitourinary: Negative.   Musculoskeletal: Positive for joint pain.  Skin: Negative.   Neurological: Negative.   Endo/Heme/Allergies: Negative.   Psychiatric/Behavioral: The patient is nervous/anxious.    Blood pressure 116/50, pulse 70, temperature 98.3 F (36.8 C), temperature source Oral, resp. rate 18, height $RemoveBe'5\' 8"'zcesFFffb$  (1.727 m), weight 94.7 kg (208 lb 12.4 oz), SpO2 99.00%. Physical Exam  Constitutional: She is oriented to person, place, and time. She appears well-developed and well-nourished.  HENT:  Head: Normocephalic and atraumatic.  Eyes: Conjunctivae and EOM are normal. Pupils are equal, round, and reactive to light.  Neck: Normal range of motion. Neck supple.  Cardiovascular: Normal rate and regular rhythm.   Respiratory: Effort normal and breath sounds normal.  GI: Soft. Bowel sounds are normal. She exhibits no distension and no mass. There is no tenderness. There is no rebound and no guarding.  Musculoskeletal: Normal range of motion.  Neurological: She is alert and oriented to person, place, and time.  Skin: Skin is warm and dry.  Psychiatric: She has a normal mood and affect. Her behavior is normal. Judgment and thought content normal.   Assessment/Plan: 1) Rectal bleeding with anemia: I suspect she has a diverticular bleed. We will plan to do a colonoscopy on 01/07/14. She has been on Plavix, Aspirin and Naprosyn at  home.  2) Constipation.  3) GERD.  4) HTN/CAD.  5) Anxiety disorder.  Jla Reynolds 01/11/2014, 6:38 PM

## 2014-01-11 NOTE — Progress Notes (Signed)
Pt's and family at bedside wanted to know if GI is coming today and if ivf rated can be changed from 75 ml/hr.  Dr Tana Coast informed  And instructed that Dr. Collene Mares was called this am and instructed will see pt also order to change ivf rate to 1mL/hr.  Pt and family member made aware and verbalized understanding.  Karie Kirks, Therapist, sports.

## 2014-01-11 NOTE — Progress Notes (Signed)
UR completed Adelin Ventrella K. Irisa Grimsley, RN, BSN, [place], CCM  01/11/2014 2:20 PM

## 2014-01-11 NOTE — H&P (Signed)
[facility] Admission History and Physical       KAMAL VAUPEL [id number] DOB: 01-03-1941 DOA: 01/10/2014  Referring physician: EDP PCP: [name], MD  Specialists:   Chief Complaint: Bloody Stools  HPI: Shelly [name] is a 73 y.o. female with a history of CAD, HTN Arthritis and GERD who presents to the ED with complaints of BRBPR x 3-4 days.  She denies any ABD Pain and Nausea and vomiting.  She also denies SOB, Weakness, and Lightheadedness.  She reports that she saw her PCP and was to have a colonoscopy scheduled but her symptoms worsened and she reports that she began to pass large dark clots.     Review of Systems:  Constitutional: No Weight Loss, No Weight Gain, Night Sweats, Fevers, Chills, Dizziness, Fatigue, or Generalized Weakness HEENT: No Headaches, Difficulty Swallowing,Tooth/Dental Problems,Sore Throat,  No Sneezing, Rhinitis, Ear Ache, Nasal Congestion, or Post Nasal Drip,  Cardio-vascular:  No Chest pain, Orthopnea, PND, Edema in Lower Extremities, Anasarca, Dizziness, Palpitations  Resp: No Dyspnea, No DOE, No Productive Cough, No Non-Productive Cough, No Hemoptysis, No Wheezing.    GI: No Heartburn, Indigestion, Abdominal Pain, Nausea, Vomiting, Diarrhea, Hematemesis, +Hematochezia, Melena, Change in Bowel Habits,  Loss of Appetite  GU: No Dysuria, Change in Color of Urine, No Urgency or Frequency, No Flank pain.  Musculoskeletal: No Joint Pain or Swelling, No Decreased Range of Motion, No Back Pain.  Neurologic: No Syncope, No Seizures, Muscle Weakness, Paresthesia, Vision Disturbance or Loss, No Diplopia, No Vertigo, No Difficulty Walking,  Skin: No Rash or Lesions. Psych: No Change in Mood or Affect, No Depression or Anxiety, No Memory loss, No Confusion, or Hallucinations   Past Medical History  Diagnosis Date  . Goiter   . Hypertension   . Arthritis   . Coronary artery disease     SEVERE  . Anginal pain   . Myocardial infarction 08/2013   . GERD (gastroesophageal reflux disease)     Past Surgical History  Procedure Laterality Date  . Corneal transplant    . Tonsillectomy    . Tubal ligation    . Heel spur excision        Prior to Admission medications   Medication Sig Start Date End Date Taking? Authorizing Provider  aspirin EC 81 MG tablet Take 81 mg by mouth daily.   Yes Historical Provider, MD  atorvastatin (LIPITOR) 80 MG tablet Take 1 tablet (80 mg total) by mouth daily at 6 PM. 08/25/13  Yes Modena Jansky, MD  b complex vitamins capsule Take 1 capsule by mouth daily.   Yes Historical Provider, MD  clopidogrel (PLAVIX) 75 MG tablet Take 75 mg by mouth daily.   Yes Historical Provider, MD  ibuprofen (ADVIL,MOTRIN) 800 MG tablet Take 800 mg by mouth every 8 (eight) hours as needed for moderate pain.    Yes Historical Provider, MD  lisinopril (PRINIVIL,ZESTRIL) 20 MG tablet Take 20 mg by mouth daily. 07/25/13  Yes Historical Provider, MD  metoprolol succinate (TOPROL XL) 25 MG 24 hr tablet Take 1 tablet (25 mg total) by mouth daily. 10/19/13  Yes Herminio Commons, MD  nitroGLYCERIN (NITROSTAT) 0.4 MG SL tablet Place 1 tablet (0.4 mg total) under the tongue every 5 (five) minutes as needed for chest pain. 09/07/13  Yes Scott T Kathlen Mody, PA-C  pantoprazole (PROTONIX) 40 MG tablet Take 1 tablet (40 mg total) by mouth daily. 09/07/13  Yes Liliane Shi, PA-C     Allergies  Allergen Reactions  .  Bayer Advanced Aspirin [Aspirin] Palpitations    Patient can tolerate "low dose" aspirin     Social History:  reports that she has never smoked. She has never used smokeless tobacco. She reports that she does not drink alcohol or use illicit drugs.     No family history on file.     Physical Exam:  GEN:  Pleasant Obese 73 y.o. African American female  examined  and in no acute distress; cooperative with exam Filed Vitals:   01/11/14 0000 01/11/14 0030 01/11/14 0045 01/11/14 0215  BP: 121/55 122/50 119/53 107/50  Pulse:  79 79 79 78  Temp:      TempSrc:      Resp:    14  Height:      SpO2: 99% 98% 100% 100%   Blood pressure 107/50, pulse 78, temperature 97.8 F (36.6 C), temperature source Oral, resp. rate 14, height 5\' 8"  (1.727 m), SpO2 100.00%. PSYCH: She is alert and oriented x4; does not appear anxious does not appear depressed; affect is normal HEENT: Normocephalic and Atraumatic, Mucous membranes pink; PERRLA; EOM intact; Fundi:  Benign;  No scleral icterus, Nares: Patent, Oropharynx: Clear, Fair Dentition,    Neck:  FROM, No Cervical Lymphadenopathy nor Thyromegaly or Carotid Bruit; No JVD; Breasts:: Not examined CHEST WALL: No tenderness CHEST: Normal respiration, clear to auscultation bilaterally HEART: Regular rate and rhythm; no murmurs rubs or gallops BACK: No kyphosis or scoliosis; No CVA tenderness ABDOMEN: Positive Bowel Sounds, Obese, Soft Non-Tender; No Masses, No Organomegaly. Rectal Exam: Not done EXTREMITIES: No Cyanosis, Clubbing, or Edema; No Ulcerations. Genitalia: not examined PULSES: 2+ and symmetric SKIN: Normal hydration no rash or ulceration CNS:  Alert and Oriented x 4, No Focal Deficits Vascular: pulses palpable throughout    Labs on Admission:  Basic Metabolic Panel:  Recent Labs Lab 01/10/14 2146  NA 142  K 4.5  CL 109  CO2 21  GLUCOSE 140*  BUN 43*  CREATININE 1.49*  CALCIUM 9.5   Liver Function Tests:  Recent Labs Lab 01/10/14 2146  AST 19  ALT 14  ALKPHOS 82  BILITOT <0.2*  PROT 7.0  ALBUMIN 3.2*   No results found for this basename: LIPASE, AMYLASE,  in the last 168 hours No results found for this basename: AMMONIA,  in the last 168 hours CBC:  Recent Labs Lab 01/10/14 2146 01/11/14 0144  WBC 8.5 9.7  HGB 8.6* 8.7*  HCT 26.0* 26.9*  MCV 91.2 91.8  PLT 291 293   Cardiac Enzymes: No results found for this basename: CKTOTAL, CKMB, CKMBINDEX, TROPONINI,  in the last 168 hours  BNP (last 3 results) No results found for this  basename: PROBNP,  in the last 8760 hours CBG: No results found for this basename: GLUCAP,  in the last 168 hours  Radiological Exams on Admission: No results found.     Assessment/Plan:  73 y.o. female with   Principal Problem:   1.   GI bleeding/Rectal bleeding- probable Diverticular or Lower GI   IV Protonix   Hold Plavix, ASA, and NSAID Rx   Consult GI in AM      2.   Hypertension   Monitor BPs   Continue Lisinopril Rx Hold if SBP < 100    3.   Anemia-Due to #1   Transfuse PRN    Monitor H/Hs    4.   CAD (coronary artery disease)   Had been on Plavix and ASA Rx    Now on hold  due to #1    5.  DVT Prophylaxis   SCDs     Code Status:   FULL CODE    Family Communication:  Family at Bedside  Disposition Plan:    Inpatient     Time spent: [facility] Pager [phone]   If [place] Please Contact the Day Rounding Team MD for [facility]  If 7PM-7AM, Please Contact night-coverage  [email or web address] Password [facility] 01/11/2014, 3:45 AM

## 2014-01-11 NOTE — Progress Notes (Signed)
Pt stated "Don't remember the benefit and side effect of the blood."  Blood consent brought to pt and went over it with pt and verbalized understanding.  Instructed it is "Ok to start blood."  Karie Kirks, Therapist, sports.

## 2014-01-11 NOTE — Progress Notes (Signed)
Patient ID: Shelly [name]  female  [id number]    DOB: 05-22-1940    DOA: 01/10/2014  PCP: [name], MD  Assessment/Plan: Principal Problem:   GI bleed: Per patient noted dark stools and bright red blood per rectum for last 3-4 days, at times heavy, last colonoscopy 10-15 years ago  - Hemoglobin down to 7.5, will transfuse 2 units of packed RBCs - GI consult obtained, discussed with Dr. Collene Mares - Hold aspirin, Plavix, continue PPI  Active Problems:   Hypertension - Currently stable    Anemia;  - due to #1, transfuse 2 units packed RBCs    CAD (coronary artery disease) - on aspirin and Plavix, currently on hold  - Patient had DES x 2 to RCA 08/2013, still at high risk for in-stent thrombosis, after GI evaluation, will discuss with cardiology regarding further management  DVT Prophylaxis: SCDs   Code Status:  Family Communication: discussed with the patient and daughter in the room   Disposition:  Consultants:  Gastroenterology   Procedures:  None   Antibiotics:  None     Subjective: Patient seen and examined, no active bleeding, no nausea, vomiting or abdominal pain   Objective: Weight change:   Intake/Output Summary (Last 24 hours) at 01/11/14 1130 Last data filed at 01/11/14 0844  Gross per 24 hour  Intake 238.75 ml  Output      0 ml  Net 238.75 ml   Blood pressure 102/53, pulse 77, temperature 98.3 F (36.8 C), temperature source Oral, resp. rate 18, height 5\' 8"  (1.727 m), weight 94.7 kg (208 lb 12.4 oz), SpO2 100.00%.  Physical Exam: General: Alert and awake, oriented x3, not in any acute distress. CVS: S1-S2 clear, no murmur rubs or gallops Chest: clear to auscultation bilaterally, no wheezing, rales or rhonchi Abdomen: soft nontender, nondistended, normal bowel sounds  Extremities: no cyanosis, clubbing or edema noted bilaterally Neuro: Cranial nerves II-XII intact, no focal neurological deficits  Lab Results: Basic Metabolic  Panel:  Recent Labs Lab 01/10/14 2146 01/11/14 0536  NA 142 143  K 4.5 3.9  CL 109 112  CO2 21 21  GLUCOSE 140* 112*  BUN 43* 43*  CREATININE 1.49* 1.37*  CALCIUM 9.5 9.0   Liver Function Tests:  Recent Labs Lab 01/10/14 2146  AST 19  ALT 14  ALKPHOS 82  BILITOT <0.2*  PROT 7.0  ALBUMIN 3.2*   No results found for this basename: LIPASE, AMYLASE,  in the last 168 hours No results found for this basename: AMMONIA,  in the last 168 hours CBC:  Recent Labs Lab 01/11/14 0144 01/11/14 0351 01/11/14 0536  WBC 9.7  --  7.1  HGB 8.7* 7.9* 7.5*  HCT 26.9* 23.9* 22.7*  MCV 91.8  --  91.5  PLT 293  --  244   Cardiac Enzymes: No results found for this basename: CKTOTAL, CKMB, CKMBINDEX, TROPONINI,  in the last 168 hours BNP: No components found with this basename: POCBNP,  CBG: No results found for this basename: GLUCAP,  in the last 168 hours   Micro Results: No results found for this or any previous visit (from the past 240 hour(s)).  Studies/Results: No results found.  Medications: Scheduled Meds: . sodium chloride   Intravenous Once  . atorvastatin  80 mg Oral q1800  . lisinopril  20 mg Oral Daily  . metoprolol succinate  25 mg Oral Daily  . pantoprazole (PROTONIX) IV  40 mg Intravenous Q12H  . sodium chloride  3 mL  Intravenous Q12H      LOS: 1 day   RAI,RIPUDEEP M.D. [facility] 01/11/2014, 11:30 AM Pager: [phone]  If 7PM-7AM, please contact night-coverage [email or web address] Password [email or web address]  **Disclaimer: This note was dictated with voice recognition software. Similar sounding words can inadvertently be transcribed and this note may contain transcription errors which may not have been corrected upon publication of note.**

## 2014-01-11 NOTE — Care Management Note (Addendum)
    Page 1 of 1   01/13/2014     2:15:40 PM CARE MANAGEMENT NOTE 01/13/2014  Patient:  [facility]   Account Number:  [id number]  Date Initiated:  01/11/2014  Documentation initiated by:  HUTCHINSON,CRYSTAL  Subjective/Objective Assessment:   Blood in stool, GI bleed     Action/Plan:   CM to monitor for disposition needs   Anticipated DC Date:  01/12/2014   Anticipated DC Plan:  HOME/SELF CARE         Choice offered to / List presented to:             Status of service:  Completed, signed off Medicare Important Message given?  YES (If response is "NO", the following Medicare IM given date fields will be blank) Date Medicare IM given:  01/13/2014 Medicare IM given by:  [facility] Date Additional Medicare IM given:   Additional Medicare IM given by:    Discharge Disposition:  HOME/SELF CARE  Per UR Regulation:  Reviewed for med. necessity/level of care/duration of stay  If discussed at [place] of Stay Meetings, dates discussed:    Comments:  Crystal Hutchinson RN, BSN, MSHL, CCM  Nurse - Case Manager,  (Unit [place])  [phone]  01/11/2014 Social:  from home Disposition plan at this time:  Home / [place]. CM will continue to monitor.

## 2014-01-11 NOTE — ED Provider Notes (Signed)
CSN: [id number]     Arrival date & time 01/10/14  2115 History   First MD Initiated Contact with Patient 01/10/14 2326     Chief Complaint  Patient presents with  . Blood In Stools     (Consider location/radiation/quality/duration/timing/severity/associated sxs/prior Treatment) HPI  Shelly [name] is a 73 year old female with a past medical history of coronary artery disease status post stent placement coming in with GI bleed. Patient states she has been having a GI bleed for the past 3 days. She is taking aspirin and Plavix for her cardiac stents. She denies this ever occurring in the past. She denies any abdominal pain nausea vomiting or changes in her urination. She has no pain with defecation. She denies any signs of anemia including shortness of breath weakness fatigue or presyncopal symptoms. Patient denies any fevers recent infections or chest pain.  Past Medical History  Diagnosis Date  . Goiter   . Hypertension   . Arthritis   . Coronary artery disease     SEVERE  . Anginal pain   . Myocardial infarction 08/2013  . GERD (gastroesophageal reflux disease)    Past Surgical History  Procedure Laterality Date  . Corneal transplant    . Tonsillectomy    . Tubal ligation    . Heel spur excision     No family history on file. History  Substance Use Topics  . Smoking status: Never Smoker   . Smokeless tobacco: Never Used  . Alcohol Use: No   OB History   Grav Para Term Preterm Abortions TAB SAB Ect Mult Living                 Review of Systems 10 Systems reviewed and are negative for acute change except as noted in the HPI.    Allergies  Bayer advanced aspirin  Home Medications   Prior to Admission medications   Medication Sig Start Date End Date Taking? Authorizing Provider  aspirin EC 81 MG tablet Take 81 mg by mouth daily.   Yes Historical Provider, MD  atorvastatin (LIPITOR) 80 MG tablet Take 1 tablet (80 mg total) by mouth daily at 6 PM. 08/25/13  Yes Modena Jansky, MD  b complex vitamins capsule Take 1 capsule by mouth daily.   Yes Historical Provider, MD  clopidogrel (PLAVIX) 75 MG tablet Take 75 mg by mouth daily.   Yes Historical Provider, MD  ibuprofen (ADVIL,MOTRIN) 800 MG tablet Take 800 mg by mouth every 8 (eight) hours as needed for moderate pain.    Yes Historical Provider, MD  lisinopril (PRINIVIL,ZESTRIL) 20 MG tablet Take 20 mg by mouth daily. 07/25/13  Yes Historical Provider, MD  metoprolol succinate (TOPROL XL) 25 MG 24 hr tablet Take 1 tablet (25 mg total) by mouth daily. 10/19/13  Yes Herminio Commons, MD  nitroGLYCERIN (NITROSTAT) 0.4 MG SL tablet Place 1 tablet (0.4 mg total) under the tongue every 5 (five) minutes as needed for chest pain. 09/07/13  Yes Scott T Kathlen Mody, PA-C  pantoprazole (PROTONIX) 40 MG tablet Take 1 tablet (40 mg total) by mouth daily. 09/07/13  Yes Scott T Weaver, PA-C   BP 107/50  Pulse 78  Temp(Src) 97.8 F (36.6 C) (Oral)  Resp 14  Ht 5\' 8"  (1.727 m)  SpO2 100% Physical Exam  Nursing note and vitals reviewed. Constitutional: She is oriented to person, place, and time. She appears well-developed and well-nourished. No distress.  HENT:  Head: Normocephalic and atraumatic.  Nose: Nose normal.  Mouth/Throat: Oropharynx is clear and moist. No oropharyngeal exudate.  Eyes: Conjunctivae and EOM are normal. Pupils are equal, round, and reactive to light. No scleral icterus.  No conjunctival pallor.  Neck: Normal range of motion. Neck supple. No JVD present. No tracheal deviation present. No thyromegaly present.  Cardiovascular: Normal rate, regular rhythm and normal heart sounds.  Exam reveals no gallop and no friction rub.   No murmur heard. Pulmonary/Chest: Effort normal and breath sounds normal. No respiratory distress. She has no wheezes. She exhibits no tenderness.  Abdominal: Soft. Bowel sounds are normal. She exhibits no distension and no mass. There is no tenderness. There is no rebound and no  guarding.  Genitourinary: Guaiac positive stool.  Musculoskeletal: Normal range of motion. She exhibits no edema and no tenderness.  Lymphadenopathy:    She has no cervical adenopathy.  Neurological: She is alert and oriented to person, place, and time.  Skin: Skin is warm and dry. No rash noted. She is not diaphoretic. No erythema. No pallor.    ED Course  Procedures (including critical care time) Labs Review Labs Reviewed  CBC - Abnormal; Notable for the following:    RBC 2.85 (*)    Hemoglobin 8.6 (*)    HCT 26.0 (*)    All other components within normal limits  COMPREHENSIVE METABOLIC PANEL - Abnormal; Notable for the following:    Glucose, Bld 140 (*)    BUN 43 (*)    Creatinine, Ser 1.49 (*)    Albumin 3.2 (*)    Total Bilirubin <0.2 (*)    GFR calc non Af Amer 34 (*)    GFR calc Af Amer 39 (*)    All other components within normal limits  CBC - Abnormal; Notable for the following:    RBC 2.93 (*)    Hemoglobin 8.7 (*)    HCT 26.9 (*)    All other components within normal limits  HEMOGLOBIN AND HEMATOCRIT, BLOOD  HEMOGLOBIN AND HEMATOCRIT, BLOOD  HEMOGLOBIN AND HEMATOCRIT, BLOOD  POC OCCULT BLOOD, ED  TYPE AND SCREEN  ABO/RH    Imaging Review No results found.   EKG Interpretation None      MDM   Final diagnoses:  GI bleed not requiring more than 4 units of blood in 24 hours, ICU, or surgery    Patient presents today in emergency department for GI bleed. This is in the setting of being on aspirin and Plavix for cardiac stents. Her hemoglobin did drop from 11-8 over the course of approximately one month. Patient's Hemoccult was positive.  Repeat CBC in emergency department was stable at 8 and a type and screen was sent. Patient be retained to the hospital for further care concerning her antiplatelet therapy and GI bleed. She is admitted to the telemetry unit for continued care.    Everlene Balls, MD 01/11/14 [phone]

## 2014-01-12 DIAGNOSIS — I2510 Atherosclerotic heart disease of native coronary artery without angina pectoris: Secondary | ICD-10-CM

## 2014-01-12 LAB — TYPE AND SCREEN
ABO/RH(D): O POS
ANTIBODY SCREEN: NEGATIVE
UNIT DIVISION: 0
Unit division: 0

## 2014-01-12 LAB — HEMOGLOBIN AND HEMATOCRIT, BLOOD
HCT: 27.5 % — ABNORMAL LOW (ref 36.0–46.0)
HEMATOCRIT: 25.9 % — AB (ref 36.0–46.0)
HEMATOCRIT: 26 % — AB (ref 36.0–46.0)
HEMOGLOBIN: 8.8 g/dL — AB (ref 12.0–15.0)
Hemoglobin: 8.7 g/dL — ABNORMAL LOW (ref 12.0–15.0)
Hemoglobin: 9.1 g/dL — ABNORMAL LOW (ref 12.0–15.0)

## 2014-01-12 LAB — PLATELET INHIBITION P2Y12: Platelet Function  P2Y12: 289 [PRU] (ref 194–418)

## 2014-01-12 MED ORDER — SODIUM CHLORIDE 0.9 % IV SOLN
INTRAVENOUS | Status: DC
  Administered 2014-01-12: 1000 mL via INTRAVENOUS
  Administered 2014-01-12: 22:00:00 via INTRAVENOUS

## 2014-01-12 MED ORDER — PEG 3350-KCL-NA BICARB-NACL 420 G PO SOLR
4000.0000 mL | Freq: Once | ORAL | Status: AC
  Administered 2014-01-12: 4000 mL via ORAL
  Filled 2014-01-12: qty 4000

## 2014-01-12 NOTE — Consult Note (Signed)
Name: Shelly [name] is a 73 y.o. female Admit date: 01/10/2014 Referring Physician: Tyler Pita, MD Primary Physician:  Tilda Burrow Primary Cardiologist:  Court Joy, MD  Reason for Consultation:  GI bleed/BRBPR on DAPT (ASA and Plavix)  ASSESSMENT:  1. CAD with ASC in April 2015 treated with RCA overlapping DES proximal to mid (total 61 mm stent segment) 2. Lower GI bleed requiring transfusion 3. Hypertension 4. Arthritis with chronic Naprosyn use 5. CKD, III-IV  PLAN:  1. She is at higher than usual risk for subacute stent thrombosis due to the length of stented segment. We have no choice but to hold therapy currently.  2. P2Y12 3. Monitor closely and if any chest pain, consider stent thrombosis first. 4. Will follow and advise. 5. ECG   HPI: 73 year old with history of hypertension, arthritis, NSAID use, DAPT, and history of GERD. Began having BRBPR with weakness ~ 4 days ago. Admitted with lower GI bleed and has received 2 units PRBC. No cardiac complaints to this point.  PMH:   Past Medical History  Diagnosis Date  . Goiter   . Hypertension   . Arthritis   . Coronary artery disease     SEVERE  . Anginal pain   . Myocardial infarction 08/2013  . GERD (gastroesophageal reflux disease)   . GI bleed 12/2013    PSH:   Past Surgical History  Procedure Laterality Date  . Corneal transplant    . Tonsillectomy    . Tubal ligation    . Heel spur excision    . Coronary angioplasty  08/2013   Allergies:  Bayer advanced aspirin Prior to Admit Meds:   Prescriptions prior to admission  Medication Sig Dispense Refill  . aspirin EC 81 MG tablet Take 81 mg by mouth daily.      Marland Kitchen atorvastatin (LIPITOR) 80 MG tablet Take 1 tablet (80 mg total) by mouth daily at 6 PM.  30 tablet  0  . b complex vitamins capsule Take 1 capsule by mouth daily.      . clopidogrel (PLAVIX) 75 MG tablet Take 75 mg by mouth daily.      Marland Kitchen ibuprofen (ADVIL,MOTRIN) 800 MG tablet Take 800 mg by mouth  every 8 (eight) hours as needed for moderate pain.       Marland Kitchen lisinopril (PRINIVIL,ZESTRIL) 20 MG tablet Take 20 mg by mouth daily.      . metoprolol succinate (TOPROL XL) 25 MG 24 hr tablet Take 1 tablet (25 mg total) by mouth daily.  30 tablet  6  . nitroGLYCERIN (NITROSTAT) 0.4 MG SL tablet Place 1 tablet (0.4 mg total) under the tongue every 5 (five) minutes as needed for chest pain.  25 tablet  3  . pantoprazole (PROTONIX) 40 MG tablet Take 1 tablet (40 mg total) by mouth daily.  30 tablet  3   Fam HX:   History reviewed. No pertinent family history. Social HX:    History   Social History  . Marital Status: Single    Spouse Name: N/A    Number of Children: N/A  . Years of Education: N/A   Occupational History  . Not on file.   Social History Main Topics  . Smoking status: Never Smoker   . Smokeless tobacco: Never Used  . Alcohol Use: No  . Drug Use: No  . Sexual Activity: No   Other Topics Concern  . Not on file   Social History Narrative  . No narrative on file  Review of Systems: No history of asthma  Physical Exam: Blood pressure 100/51, pulse 70, temperature 98.4 F (36.9 C), temperature source Oral, resp. rate 16, height 5\' 8"  (1.727 m), weight 207 lb 3.2 oz (93.985 kg), SpO2 97.00%. Weight change: -1 lb 9.2 oz (-0.715 kg)   Lying flat Chest is clear No JVD Cardiac with no murmur or gallop.  No edema Neuro is unremarkable. Labs: Lab Results  Component Value Date   WBC 7.1 01/11/2014   HGB 8.7* 01/12/2014   HCT 26.0* 01/12/2014   MCV 91.5 01/11/2014   PLT 244 01/11/2014    Recent Labs Lab 01/10/14 2146 01/11/14 0536  NA 142 143  K 4.5 3.9  CL 109 112  CO2 21 21  BUN 43* 43*  CREATININE 1.49* 1.37*  CALCIUM 9.5 9.0  PROT 7.0  --   BILITOT <0.2*  --   ALKPHOS 82  --   ALT 14  --   AST 19  --   GLUCOSE 140* 112*   No results found for this basename: PTT   Lab Results  Component Value Date   INR 1.07 08/22/2013   Lab Results  Component  Value Date   TROPONINI 2.07* 08/23/2013     Lab Results  Component Value Date   CHOL 214* 10/19/2013   CHOL 239* 08/24/2013   Lab Results  Component Value Date   HDL 47 10/19/2013   HDL 46 08/24/2013   Lab Results  Component Value Date   LDLCALC 150* 10/19/2013   LDLCALC 175* 08/24/2013   Lab Results  Component Value Date   TRIG 83 10/19/2013   TRIG 88 08/24/2013   Lab Results  Component Value Date   CHOLHDL 4.6 10/19/2013   CHOLHDL 5.2 08/24/2013   No results found for this basename: LDLDIRECT      Radiology:  No updated  EKG:  Not updated    Sinclair Grooms 01/12/2014 12:50 PM

## 2014-01-12 NOTE — Progress Notes (Signed)
Patient ID: KENLEIGH ASHWELL  female  [id number]    DOB: Aug 19, 1940    DOA: 01/10/2014  PCP: [name], MD  Assessment/Plan: Principal Problem:  Acute blood loss anemia/ GI bleed: Per patient noted dark stools and bright red blood per rectum for last 3-4 days PTA, at times heavy, last colonoscopy 10-15 years ago  - Hemoglobin 8.8 s/p 2 units of packed RBCs 8/26 - GI consult obtained, colonoscopy planned - Hold aspirin, Plavix, continue PPI  Active Problems:   Hypertension - Currently stable    Acute blood loss anemia due to GI bleeding, history of chronic anemia   - due to #1, transfused 2 units packed RBCs    CAD (coronary artery disease) - on aspirin and Plavix, currently on hold due to GI bleed - Patient had DES x 2 to RCA 08/2013, still at high risk for in-stent thrombosis  - Colonoscopy planned, I called cardiology for their recommendations regarding aspirin and Plavix   DVT Prophylaxis: SCDs   Code Status:  Family Communication: discussed with the patient in the room   Disposition:  Consultants:  Gastroenterology   Cardiology  Procedures:  None   Antibiotics:  None     Subjective: Patient seen and examined, no active bleeding but dark blood in the stools  Objective: Weight change: -0.715 kg (-1 lb 9.2 oz)  Intake/Output Summary (Last 24 hours) at 01/12/14 1218 Last data filed at 01/12/14 0122  Gross per 24 hour  Intake  347.5 ml  Output   1550 ml  Net -1202.5 ml   Blood pressure 100/51, pulse 70, temperature 98.4 F (36.9 C), temperature source Oral, resp. rate 16, height 5\' 8"  (1.727 m), weight 93.985 kg (207 lb 3.2 oz), SpO2 97.00%.  Physical Exam: General: Alert and awake, oriented x3, not in any acute distress. CVS: S1-S2 clear, no murmur rubs or gallops Chest: clear to auscultation bilaterally, no wheezing, rales or rhonchi Abdomen: soft nontender, nondistended, normal bowel sounds  Extremities: no cyanosis, clubbing or edema noted  bilaterally   Lab Results: Basic Metabolic Panel:  Recent Labs Lab 01/10/14 2146 01/11/14 0536  NA 142 143  K 4.5 3.9  CL 109 112  CO2 21 21  GLUCOSE 140* 112*  BUN 43* 43*  CREATININE 1.49* 1.37*  CALCIUM 9.5 9.0   Liver Function Tests:  Recent Labs Lab 01/10/14 2146  AST 19  ALT 14  ALKPHOS 82  BILITOT <0.2*  PROT 7.0  ALBUMIN 3.2*   No results found for this basename: LIPASE, AMYLASE,  in the last 168 hours No results found for this basename: AMMONIA,  in the last 168 hours CBC:  Recent Labs Lab 01/11/14 0144  01/11/14 0536  01/11/14 1934 01/12/14 0313  WBC 9.7  --  7.1  --   --   --   HGB 8.7*  < > 7.5*  < > 9.1* 8.8*  HCT 26.9*  < > 22.7*  < > 26.6* 25.9*  MCV 91.8  --  91.5  --   --   --   PLT 293  --  244  --   --   --   < > = values in this interval not displayed. Cardiac Enzymes: No results found for this basename: CKTOTAL, CKMB, CKMBINDEX, TROPONINI,  in the last 168 hours BNP: No components found with this basename: POCBNP,  CBG: No results found for this basename: GLUCAP,  in the last 168 hours   Micro Results: No results found for this  or any previous visit (from the past 240 hour(s)).  Studies/Results: No results found.  Medications: Scheduled Meds: . sodium chloride   Intravenous Once  . atorvastatin  80 mg Oral q1800  . lisinopril  20 mg Oral Daily  . metoprolol succinate  25 mg Oral Daily  . pantoprazole (PROTONIX) IV  40 mg Intravenous Q12H  . sodium chloride  3 mL Intravenous Q12H      LOS: 2 days   RAI,RIPUDEEP M.D. [facility] 01/12/2014, 12:18 PM Pager: [phone]  If 7PM-7AM, please contact night-coverage [email or web address] Password [email or web address]  **Disclaimer: This note was dictated with voice recognition software. Similar sounding words can inadvertently be transcribed and this note may contain transcription errors which may not have been corrected upon publication of note.**

## 2014-01-12 NOTE — Clinical Documentation Improvement (Signed)
01/12/14  Presents with melana, GIB; anemia documented in progress notes.   Hgb / Hct has dropped from 8.6 / 26  to  7.5 / 22.7  Serial H&H monitoring has been done  2 units of PRBC's given  Please clarify the type of anemia you feel the patient has:   Acute Blood Loss Anemia  Acute on chronic blood loss anemia  Chronic blood loss anemia  Precipitous drop in Hematocrit  Other Condition________________  Hoyt Koch, Zoila Shutter ,RN Clinical Documentation Specialist:  [place] [employer]

## 2014-01-13 ENCOUNTER — Encounter (HOSPITAL_COMMUNITY)
Admission: EM | Disposition: A | Discharge status: Home / Self Care | Payer: Self-pay | Source: Home / Self Care | Attending: Internal Medicine

## 2014-01-13 HISTORY — PX: COLONOSCOPY: SHX5424

## 2014-01-13 SURGERY — COLONOSCOPY
Anesthesia: Moderate Sedation

## 2014-01-13 MED ORDER — TRAMADOL HCL 50 MG PO TABS: 50.0000 mg | ORAL_TABLET | Freq: Four times a day (QID) | ORAL | Status: DC | PRN

## 2014-01-13 MED ORDER — FENTANYL CITRATE 0.05 MG/ML IJ SOLN
INTRAMUSCULAR | Status: DC | PRN
  Administered 2014-01-13: 25 ug via INTRAVENOUS

## 2014-01-13 MED ORDER — CLOPIDOGREL BISULFATE 75 MG PO TABS: 75.0000 mg | ORAL_TABLET | Freq: Every day | ORAL | Status: DC

## 2014-01-13 MED ORDER — MIDAZOLAM HCL 5 MG/5ML IJ SOLN
INTRAMUSCULAR | Status: DC | PRN
  Administered 2014-01-13: 2 mg via INTRAVENOUS
  Administered 2014-01-13: 1 mg via INTRAVENOUS

## 2014-01-13 MED ORDER — OXYCODONE HCL 5 MG PO TABS: 5.0000 mg | ORAL_TABLET | Freq: Three times a day (TID) | ORAL | Status: DC | PRN

## 2014-01-13 MED ORDER — FENTANYL CITRATE 0.05 MG/ML IJ SOLN
INTRAMUSCULAR | Status: AC
  Filled 2014-01-13: qty 2

## 2014-01-13 MED ORDER — MIDAZOLAM HCL 5 MG/ML IJ SOLN
INTRAMUSCULAR | Status: AC
  Filled 2014-01-13: qty 2

## 2014-01-13 NOTE — Op Note (Signed)
[facility] [place], [address]   OPERATIVE PROCEDURE REPORT  PATIENT: [name], Shelly  MR#: [id number] BIRTHDATE: [birth year]  GENDER: Female ENDOSCOPIST: Carol Ada, MD ASSISTANT:   Tedra Coupe, Mariana Single, RN PROCEDURE DATE: 01/13/2014 PROCEDURE:   Colonoscopy, diagnostic ASA CLASS:   Class III INDICATIONS:Rectal Bleeding. MEDICATIONS: Versed 3 mg IV and Fentanyl 25 mcg IV  DESCRIPTION OF PROCEDURE:   After the risks benefits and alternatives of the procedure were thoroughly explained, informed consent was obtained.  A digital rectal exam revealed no abnormalities of the rectum.    The     endoscope was introduced through the anus  and advanced to the cecum, which was identified by both the appendix and ileocecal valve , No adverse events experienced.    The quality of the prep was good. .  The instrument was then slowly withdrawn as the colon was fully examined.     FINDINGS: Old liquid blood mixed with liquid stool was encountered. The concentration of blood decreased as the colonoscope was advanced proximally.  Scattered diverticula were noted throughout the entire colon.  No evidence of any active bleeding.  With extensive washing, 1.5 liters, there was no reaccumulation of blood.  No evidence of any polyps, masses, inflammation, ulcerations, erosions, or vascular abnormalities.   Retroflexed views revealed no abnormalities.     The scope was then withdrawn from the patient and the procedure terminated.  COMPLICATIONS: There were no complications.  IMPRESSION: 1) Pandiverticulosis - Presumed diverticular bleed.  RECOMMENDATIONS: 1) No further GI evaluation. 2) Follow HGB and transfuse as necessary. 3) Signing off.  _______________________________ Lorrin MaisCarol Ada, MD 01/13/2014 8:03 AM

## 2014-01-13 NOTE — Progress Notes (Signed)
Pt returned from Endo after colonoscopy.  A&0x4.  Assisted back to bed and call bell at her side.  Denies pain or discomfort.  Pt requesting to eat informed Dr. Benny Lennert and instructed to order regular diet.  Will continue to monitor.  Karie Kirks, Therapist, sports.

## 2014-01-13 NOTE — Progress Notes (Signed)
Subjective: Complains of her IV in the left wrist.  Objective: Vital signs in last 24 hours: Temp:  [97.4 F (36.3 C)-98.7 F (37.1 C)] 98.7 F (37.1 C) (08/28 0404) Pulse Rate:  [67-78] 72 (08/28 0725) Resp:  [16-20] 16 (08/28 0725) BP: (100-156)/(50-55) 154/54 mmHg (08/28 0725) SpO2:  [100 %] 100 % (08/28 0725) Weight:  [206 lb 5.6 oz (93.6 kg)] 206 lb 5.6 oz (93.6 kg) (08/28 0404) Last BM Date: 01/12/14  Intake/Output from previous day: 08/27 0701 - 08/28 0700 In: 360 [P.O.:360] Out: 1900 [Urine:1900] Intake/Output this shift:    General appearance: alert and no distress GI: soft, non-tender; bowel sounds normal; no masses,  no organomegaly  Lab Results:  Recent Labs  01/10/14 2146 01/11/14 0144  01/11/14 0536  01/12/14 0313 01/12/14 1133 01/12/14 1955  WBC 8.5 9.7  --  7.1  --   --   --   --   HGB 8.6* 8.7*  < > 7.5*  < > 8.8* 8.7* 9.1*  HCT 26.0* 26.9*  < > 22.7*  < > 25.9* 26.0* 27.5*  PLT 291 293  --  244  --   --   --   --   < > = values in this interval not displayed. BMET  Recent Labs  01/10/14 2146 01/11/14 0536  NA 142 143  K 4.5 3.9  CL 109 112  CO2 21 21  GLUCOSE 140* 112*  BUN 43* 43*  CREATININE 1.49* 1.37*  CALCIUM 9.5 9.0   LFT  Recent Labs  01/10/14 2146  PROT 7.0  ALBUMIN 3.2*  AST 19  ALT 14  ALKPHOS 82  BILITOT <0.2*   PT/INR No results found for this basename: LABPROT, INR,  in the last 72 hours Hepatitis Panel No results found for this basename: HEPBSAG, HCVAB, HEPAIGM, HEPBIGM,  in the last 72 hours C-Diff No results found for this basename: CDIFFTOX,  in the last 72 hours Fecal Lactopherrin No results found for this basename: FECLLACTOFRN,  in the last 72 hours  Studies/Results: No results found.  Medications:  Scheduled: . [MAR HOLD] sodium chloride   Intravenous Once  . [facility] HOLD] atorvastatin  80 mg Oral q1800  . [MAR HOLD] lisinopril  20 mg Oral Daily  . [facility] HOLD] metoprolol succinate  25 mg Oral Daily   . [MAR HOLD] pantoprazole (PROTONIX) IV  40 mg Intravenous Q12H  . [facility] HOLD] sodium chloride  3 mL Intravenous Q12H   Continuous: . sodium chloride 75 mL/hr at 01/11/14 0459  . sodium chloride 1,000 mL (01/11/14 1839)  . sodium chloride 20 mL/hr at 01/12/14 2228    Assessment/Plan: 1) Hematochezia. 2) Anemia.  Plan: 1) Colonoscopy tomorrow.   LOS: 3 days   Harlie Buening D 01/13/2014, 7:30 AM

## 2014-01-13 NOTE — Discharge Summary (Signed)
Physician Discharge Summary  Patient ID: Shelly [name] MRN: [id number] DOB/AGE: August 26, 1940 73 y.o.  Admit date: 01/10/2014 Discharge date: 01/13/2014  Primary Care Physician:  [name], MD  Discharge Diagnoses:    . GI bleeding, presumably diverticular bleed from pandiverticulosis  . acute blood loss Anemia from GI bleeding  . Hypertension . CAD (coronary artery disease)  Consults:  Gastroenterology, Dr. Collene Mares                  Cardiology, Dr. Burt Knack     Recommendations for Outpatient Follow-up:  Please note that cardiology recommended to resume aspirin however hold off on Plavix for another 2 weeks and to restart if she does not have any GI bleeding. Patient will also followup with her primary cardiologist [name] on 9/4.    Allergies:   Allergies  Allergen Reactions  . Bayer Advanced Aspirin [Aspirin] Palpitations    Patient can tolerate "low dose" aspirin     Discharge Medications:   Medication List    STOP taking these medications       ibuprofen 800 MG tablet  Commonly known as:  ADVIL,MOTRIN      TAKE these medications       aspirin EC 81 MG tablet  Take 81 mg by mouth daily.     atorvastatin 80 MG tablet  Commonly known as:  LIPITOR  Take 1 tablet (80 mg total) by mouth daily at 6 PM.     b complex vitamins capsule  Take 1 capsule by mouth daily.     clopidogrel 75 MG tablet  Commonly known as:  PLAVIX  Take 1 tablet (75 mg total) by mouth daily. HOLD FOR 2 WEEKS     lisinopril 20 MG tablet  Commonly known as:  PRINIVIL,ZESTRIL  Take 20 mg by mouth daily.     metoprolol succinate 25 MG 24 hr tablet  Commonly known as:  TOPROL XL  Take 1 tablet (25 mg total) by mouth daily.     nitroGLYCERIN 0.4 MG SL tablet  Commonly known as:  NITROSTAT  Place 1 tablet (0.4 mg total) under the tongue every 5 (five) minutes as needed for chest pain.     pantoprazole 40 MG tablet  Commonly known as:  PROTONIX  Take 1 tablet (40 mg total) by mouth  daily.     traMADol 50 MG tablet  Commonly known as:  ULTRAM  Take 1 tablet (50 mg total) by mouth every 6 (six) hours as needed for moderate pain.         Brief H and P: For complete details please refer to admission H and P, but in brief Shelly [name] is a 73 y.o. female with a history of CAD, HTN Arthritis and GERD who presents to the ED with complaints of BRBPR x 3-4 days. She denied any ABD Pain and Nausea and vomiting. She also denied SOB, Weakness, and Lightheadedness. She reports that she saw her PCP and was to have a colonoscopy scheduled but her symptoms worsened and she reports that she began to pass large dark clots.    Hospital Course:  Acute blood loss anemia/ diverticular GI bleed: Per patient noted dark stools and bright red blood per rectum for last 3-4 days PTA, at times heavy, last colonoscopy 10-15 years ago. Patient was admitted for further workup , hemoglobin was lowest at 7.5 on the day of admission. Patient was transfused 2 units of packed RBC. Hemoglobin has improved to 9.1. Aspirin and Plavix were  placed on hold.  GI consult was obtained and patient underwent colonoscopy which showed pandiverticulosis hence presumed diverticular bleed. No further GI evaluation is recommended. Cardiology consult was obtained as patient had DES x 2 to RCA 08/2013, still at high risk for in-stent thrombosis. Dr. Burt Knack recommended restarting aspirin and hold off on Plavix for another 2 weeks, restart if no further GI bleeding. Would consider stopping Plavix altogether when she is out to 6 months from PCI. Patient was also recommended strongly to avoid any NSAIDs.  Hypertension - Currently stable   Acute blood loss anemia due to GI bleeding, history of chronic anemia - due to #1, transfused 2 units packed RBCs   CAD (coronary artery disease) - - Patient had DES x 2 to RCA 08/2013, still at high risk for in-stent thrombosis, please see #1 for cardiology conditions.     Day of  Discharge BP 103/50  Pulse 93  Temp(Src) 98 F (36.7 C) (Oral)  Resp 18  Ht 5\' 8"  (1.727 m)  Wt 93.6 kg (206 lb 5.6 oz)  BMI 31.38 kg/m2  SpO2 100%  Physical Exam: General: Alert and awake oriented x3 not in any acute distress. CVS: S1-S2 clear no murmur rubs or gallops Chest: clear to auscultation bilaterally, no wheezing rales or rhonchi Abdomen: soft nontender, nondistended, normal bowel sounds Extremities: no cyanosis, clubbing or edema noted bilaterally Neuro: Cranial nerves II-XII intact, no focal neurological deficits   The results of significant diagnostics from this hospitalization (including imaging, microbiology, ancillary and laboratory) are listed below for reference.    LAB RESULTS: Basic Metabolic Panel:  Recent Labs Lab 01/10/14 2146 01/11/14 0536  NA 142 143  K 4.5 3.9  CL 109 112  CO2 21 21  GLUCOSE 140* 112*  BUN 43* 43*  CREATININE 1.49* 1.37*  CALCIUM 9.5 9.0   Liver Function Tests:  Recent Labs Lab 01/10/14 2146  AST 19  ALT 14  ALKPHOS 82  BILITOT <0.2*  PROT 7.0  ALBUMIN 3.2*   No results found for this basename: LIPASE, AMYLASE,  in the last 168 hours No results found for this basename: AMMONIA,  in the last 168 hours CBC:  Recent Labs Lab 01/11/14 0144  01/11/14 0536  01/12/14 1133 01/12/14 1955  WBC 9.7  --  7.1  --   --   --   HGB 8.7*  < > 7.5*  < > 8.7* 9.1*  HCT 26.9*  < > 22.7*  < > 26.0* 27.5*  MCV 91.8  --  91.5  --   --   --   PLT 293  --  244  --   --   --   < > = values in this interval not displayed. Cardiac Enzymes: No results found for this basename: CKTOTAL, CKMB, CKMBINDEX, TROPONINI,  in the last 168 hours BNP: No components found with this basename: POCBNP,  CBG: No results found for this basename: GLUCAP,  in the last 168 hours  Significant Diagnostic Studies:  No results found.  2D ECHO:   Disposition and Follow-up: Discharge Instructions   Diet - low sodium heart healthy    Complete by:  As  directed      Discharge instructions    Complete by:  As directed   Per cardiology instructions, you can restart Aspirin. HOLD Plavix for 2 weeks, if no further bleeding, can restart Plavix and discuss with Dr Bronson Ing  Avoid peanuts, almonds, walnuts, raw fruits with seeds which can cause diverticular bleeding.  Please donot take ibuprofen, motrin or alleve or naprosyn (NSAIDS) for arthritis pain. Take tramadol instead and discuss with your primary physician.     Increase activity slowly    Complete by:  As directed             DISPOSITION: Home   DIET: Heart healthy    DISCHARGE FOLLOW-UP Follow-up Information   Follow up with Herminio Commons, MD On 01/20/2014. (2:00 pm )    Specialty:  Cardiology   Contact information:   [place] [address] [phone]       Follow up with [facility], MD On 01/19/2014. (@2 :45 pm spoke with Sharyn Lull )    Specialty:  Internal Medicine   Contact information:   [place] [address] [phone]       Time spent on Discharge: 40 mins  Signed:   RAI,RIPUDEEP M.D. [facility] 01/13/2014, 12:30 PM Pager: [phone]   **Disclaimer: This note was dictated with voice recognition software. Similar sounding words can inadvertently be transcribed and this note may contain transcription errors which may not have been corrected upon publication of note.**

## 2014-01-13 NOTE — H&P (View-Only) (Signed)
UNASSIGNED PATIENT Reason for Consult: Rectal bleeding and anemia. Referring Physician: THP  Shelly [name] is an 73 y.o. female.  HPI: 73 year old black female, with multiple medical issues listed below, on Plavix, Aspirin and Naprosyn at home, was in her USOH till about 4 days ago, when she started passing BRBPR. She claims she had a colonoscopy about 15 years ago that was reportedly normal. She cannot remember the name of her gastroenterologist. She has had problems with constipation intermittently. She denies having any associated abdominal pain, nausea, vomiting, melena, fever or chills. She has felt weak but denies having any syncope or near syncope. There is no known family history of colon cancer.   Past Medical History  Diagnosis Date  . Goiter   . Hypertension   . Arthritis   . Coronary artery disease     SEVERE  . Anginal pain   . Myocardial infarction 08/2013  . GERD (gastroesophageal reflux disease)    Past Surgical History  Procedure Laterality Date  . Corneal transplant    . Tonsillectomy    . Tubal ligation    . Heel spur excision    . Coronary angioplasty  08/2013   History reviewed. No pertinent family history.  Social History:  reports that she has never smoked. She has never used smokeless tobacco. She reports that she does not drink alcohol or use illicit drugs.  Allergies:  Allergies  Allergen Reactions  . Bayer Advanced Aspirin [Aspirin] Palpitations    Patient can tolerate "low dose" aspirin   Medications: I have reviewed the patient's current medications.  Results for orders placed during the hospital encounter of 01/10/14 (from the past 48 hour(s))  CBC     Status: Abnormal   Collection Time    01/10/14  9:46 PM      Result Value Ref Range   WBC 8.5  4.0 - 10.5 K/uL   RBC 2.85 (*) 3.87 - 5.11 MIL/uL   Hemoglobin 8.6 (*) 12.0 - 15.0 g/dL   HCT 26.0 (*) 36.0 - 46.0 %   MCV 91.2  78.0 - 100.0 fL   MCH 30.2  26.0 - 34.0 pg   MCHC 33.1  30.0 -  36.0 g/dL   RDW 14.0  11.5 - 15.5 %   Platelets 291  150 - 400 K/uL  COMPREHENSIVE METABOLIC PANEL     Status: Abnormal   Collection Time    01/10/14  9:46 PM      Result Value Ref Range   Sodium 142  137 - 147 mEq/L   Potassium 4.5  3.7 - 5.3 mEq/L   Chloride 109  96 - 112 mEq/L   CO2 21  19 - 32 mEq/L   Glucose, Bld 140 (*) 70 - 99 mg/dL   BUN 43 (*) 6 - 23 mg/dL   Creatinine, Ser 1.49 (*) 0.50 - 1.10 mg/dL   Calcium 9.5  8.4 - 10.5 mg/dL   Total Protein 7.0  6.0 - 8.3 g/dL   Albumin 3.2 (*) 3.5 - 5.2 g/dL   AST 19  0 - 37 U/L   ALT 14  0 - 35 U/L   Alkaline Phosphatase 82  39 - 117 U/L   Total Bilirubin <0.2 (*) 0.3 - 1.2 mg/dL   GFR calc non Af Amer 34 (*) >90 mL/min   GFR calc Af Amer 39 (*) >90 mL/min   Comment: (NOTE)     The eGFR has been calculated using the CKD EPI equation.  This calculation has not been validated in all clinical situations.     eGFR's persistently <90 mL/min signify possible Chronic Kidney     Disease.   Anion gap 12  5 - 15  OCCULT BLOOD, POC DEVICE     Status: Abnormal   Collection Time    01/11/14 12:19 AM      Result Value Ref Range   Fecal Occult Bld POSITIVE (*) NEGATIVE  TYPE AND SCREEN     Status: None   Collection Time    01/11/14  1:31 AM      Result Value Ref Range   ABO/RH(D) O POS     Antibody Screen NEG     Sample Expiration 01/14/2014     Unit Number [id number]     Blood Component Type RED CELLS,LR     Unit division 00     Status of Unit ISSUED     Transfusion Status OK TO TRANSFUSE     Crossmatch Result Compatible     Unit Number [id number]     Blood Component Type RED CELLS,LR     Unit division 00     Status of Unit ISSUED     Transfusion Status OK TO TRANSFUSE     Crossmatch Result Compatible    ABO/RH     Status: None   Collection Time    01/11/14  1:31 AM      Result Value Ref Range   ABO/RH(D) O POS    CBC     Status: Abnormal   Collection Time    01/11/14  1:44 AM      Result Value Ref Range    WBC 9.7  4.0 - 10.5 K/uL   RBC 2.93 (*) 3.87 - 5.11 MIL/uL   Hemoglobin 8.7 (*) 12.0 - 15.0 g/dL   HCT 26.9 (*) 36.0 - 46.0 %   MCV 91.8  78.0 - 100.0 fL   MCH 29.7  26.0 - 34.0 pg   MCHC 32.3  30.0 - 36.0 g/dL   RDW 14.3  11.5 - 15.5 %   Platelets 293  150 - 400 K/uL  HEMOGLOBIN AND HEMATOCRIT, BLOOD     Status: Abnormal   Collection Time    01/11/14  3:51 AM      Result Value Ref Range   Hemoglobin 7.9 (*) 12.0 - 15.0 g/dL   HCT 23.9 (*) 36.0 - 73.2 %  BASIC METABOLIC PANEL     Status: Abnormal   Collection Time    01/11/14  5:36 AM      Result Value Ref Range   Sodium 143  137 - 147 mEq/L   Potassium 3.9  3.7 - 5.3 mEq/L   Chloride 112  96 - 112 mEq/L   CO2 21  19 - 32 mEq/L   Glucose, Bld 112 (*) 70 - 99 mg/dL   BUN 43 (*) 6 - 23 mg/dL   Creatinine, Ser 1.37 (*) 0.50 - 1.10 mg/dL   Calcium 9.0  8.4 - 10.5 mg/dL   GFR calc non Af Amer 38 (*) >90 mL/min   GFR calc Af Amer 43 (*) >90 mL/min   Comment: (NOTE)     The eGFR has been calculated using the CKD EPI equation.     This calculation has not been validated in all clinical situations.     eGFR's persistently <90 mL/min signify possible Chronic Kidney     Disease.   Anion gap 10  5 - 15  CBC  Status: Abnormal   Collection Time    01/11/14  5:36 AM      Result Value Ref Range   WBC 7.1  4.0 - 10.5 K/uL   RBC 2.48 (*) 3.87 - 5.11 MIL/uL   Hemoglobin 7.5 (*) 12.0 - 15.0 g/dL   HCT 22.7 (*) 36.0 - 46.0 %   MCV 91.5  78.0 - 100.0 fL   MCH 30.2  26.0 - 34.0 pg   MCHC 33.0  30.0 - 36.0 g/dL   RDW 14.2  11.5 - 15.5 %   Platelets 244  150 - 400 K/uL  PREPARE RBC (CROSSMATCH)     Status: None   Collection Time    01/11/14  7:00 AM      Result Value Ref Range   Order Confirmation ORDER PROCESSED BY BLOOD BANK    PREPARE RBC (CROSSMATCH)     Status: None   Collection Time    01/11/14  7:38 AM      Result Value Ref Range   Order Confirmation       Value: ORDER PROCESSED BY BLOOD BANK BB SAMPLE OR UNITS ALREADY  AVAILABLE  HEMOGLOBIN AND HEMATOCRIT, BLOOD     Status: Abnormal   Collection Time    01/11/14  4:25 PM      Result Value Ref Range   Hemoglobin 9.5 (*) 12.0 - 15.0 g/dL   Comment: REPEATED TO VERIFY     POST TRANSFUSION SPECIMEN   HCT 27.5 (*) 36.0 - 46.0 %   No results found.  Review of Systems  Constitutional: Negative.   HENT: Negative.   Eyes: Negative.   Respiratory: Negative.   Cardiovascular: Negative.   Gastrointestinal: Positive for heartburn, constipation and blood in stool. Negative for nausea, vomiting, abdominal pain and diarrhea.  Genitourinary: Negative.   Musculoskeletal: Positive for joint pain.  Skin: Negative.   Neurological: Negative.   Endo/Heme/Allergies: Negative.   Psychiatric/Behavioral: The patient is nervous/anxious.    Blood pressure 116/50, pulse 70, temperature 98.3 F (36.8 C), temperature source Oral, resp. rate 18, height $RemoveBe'5\' 8"'cRLDVodAv$  (1.727 m), weight 94.7 kg (208 lb 12.4 oz), SpO2 99.00%. Physical Exam  Constitutional: She is oriented to person, place, and time. She appears well-developed and well-nourished.  HENT:  Head: Normocephalic and atraumatic.  Eyes: Conjunctivae and EOM are normal. Pupils are equal, round, and reactive to light.  Neck: Normal range of motion. Neck supple.  Cardiovascular: Normal rate and regular rhythm.   Respiratory: Effort normal and breath sounds normal.  GI: Soft. Bowel sounds are normal. She exhibits no distension and no mass. There is no tenderness. There is no rebound and no guarding.  Musculoskeletal: Normal range of motion.  Neurological: She is alert and oriented to person, place, and time.  Skin: Skin is warm and dry.  Psychiatric: She has a normal mood and affect. Her behavior is normal. Judgment and thought content normal.   Assessment/Plan: 1) Rectal bleeding with anemia: I suspect she has a diverticular bleed. We will plan to do a colonoscopy on 01/07/14. She has been on Plavix, Aspirin and Naprosyn at  home.  2) Constipation.  3) GERD.  4) HTN/CAD.  5) Anxiety disorder.  Chrisy Hillebrand 01/11/2014, 6:38 PM

## 2014-01-13 NOTE — Progress Notes (Addendum)
All d/c instructions explained and given to pt.   Pt requested to have MD changed tramadol pain to oxy IR as it made a sick.  Dr Tana Coast made aware and d/c pain med changed.  RX given to pt and verbalized understanding.  D/c off floor via w/c by assigned NT to awaiting transport .  Escorted by family daughter and grand-son.  Karie Kirks, Therapist, sports.

## 2014-01-13 NOTE — Interval H&P Note (Signed)
History and Physical Interval Note:  01/13/2014 7:28 AM  Shelly [name]  has presented today for surgery, with the diagnosis of Hematochezia  The various methods of treatment have been discussed with the patient and family. After consideration of risks, benefits and other options for treatment, the patient has consented to  Procedure(s): COLONOSCOPY (N/A) as a surgical intervention .  The patient's history has been reviewed, patient examined, no change in status, stable for surgery.  I have reviewed the patient's chart and labs.  Questions were answered to the patient's satisfaction.     Shelly [name] D

## 2014-01-13 NOTE — Progress Notes (Signed)
    Subjective:  No chest pain, abdominal pain, or dyspnea.  Objective:  Vital Signs in the last 24 hours: Temp:  [97.4 F (36.3 C)-98.7 F (37.1 C)] 98 F (36.7 C) (08/28 0850) Pulse Rate:  [67-78] 74 (08/28 0850) Resp:  [16-25] 18 (08/28 0850) BP: (105-156)/(31-55) 105/49 mmHg (08/28 0850) SpO2:  [98 %-100 %] 100 % (08/28 0850) Weight:  [206 lb 5.6 oz (93.6 kg)] 206 lb 5.6 oz (93.6 kg) (08/28 0404)  Intake/Output from previous day: 08/27 0701 - 08/28 0700 In: 360 [P.O.:360] Out: 1900 [Urine:1900]  Physical Exam: Pt is alert and oriented, pleasant overweight woman in NAD HEENT: normal Neck: JVP - normal Lungs: CTA bilaterally CV: RRR without murmur or gallop Abd: soft, NT Ext: no C/C/E Skin: warm/dry no rash   Lab Results:  Recent Labs  01/11/14 0144  01/11/14 0536  01/12/14 1133 01/12/14 1955  WBC 9.7  --  7.1  --   --   --   HGB 8.7*  < > 7.5*  < > 8.7* 9.1*  PLT 293  --  244  --   --   --   < > = values in this interval not displayed.  Recent Labs  01/10/14 2146 01/11/14 0536  NA 142 143  K 4.5 3.9  CL 109 112  CO2 21 21  GLUCOSE 140* 112*  BUN 43* 43*  CREATININE 1.49* 1.37*   No results found for this basename: TROPONINI, CK, MB,  in the last 72 hours  Assessment/Plan:  1. CAD with ASC in April 2015 treated with RCA overlapping DES proximal to mid (total 61 mm stent segment)  2. Lower GI bleed requiring transfusion  3. Hypertension  4. Arthritis with chronic Naprosyn use  5. CKD, III  Chart reviewed. The patient appears stable. Hemoglobin has stabilized with transfusion. Noted to have diverticular lower GI bleed. Weighed the pros and cons of resuming antiplatelet therapy in this patient who had overlapping drug-eluting stents 4 months ago and now has an acute lower GI bleed. Recommend restarting aspirin 81 mg daily and following hemoglobin/hematocrit closely as an outpatient. If she continues to be stable over the next 2 weeks, would consider  restarting Plavix at that time. Would consider stopping Plavix altogether when she is out to 6 months from PCI as I suspect her risk of recurrent diverticular bleeding is significant.  Sherren Mocha, M.D. 01/13/2014, 10:41 AM

## 2014-01-16 ENCOUNTER — Other Ambulatory Visit: Payer: Self-pay | Admitting: Physician Assistant

## 2014-01-16 ENCOUNTER — Encounter (HOSPITAL_COMMUNITY): Payer: Self-pay | Admitting: Gastroenterology

## 2014-01-20 ENCOUNTER — Encounter: Payer: Self-pay | Admitting: Cardiovascular Disease

## 2014-01-20 ENCOUNTER — Ambulatory Visit (INDEPENDENT_AMBULATORY_CARE_PROVIDER_SITE_OTHER): Payer: Medicare Other | Admitting: Cardiovascular Disease

## 2014-01-20 VITALS — BP 112/56 | HR 76 | Ht 68.0 in | Wt 209.0 lb

## 2014-01-20 DIAGNOSIS — K2971 Gastritis, unspecified, with bleeding: Secondary | ICD-10-CM

## 2014-01-20 DIAGNOSIS — K2991 Gastroduodenitis, unspecified, with bleeding: Secondary | ICD-10-CM

## 2014-01-20 DIAGNOSIS — Z955 Presence of coronary angioplasty implant and graft: Secondary | ICD-10-CM

## 2014-01-20 DIAGNOSIS — I10 Essential (primary) hypertension: Secondary | ICD-10-CM

## 2014-01-20 DIAGNOSIS — I2510 Atherosclerotic heart disease of native coronary artery without angina pectoris: Secondary | ICD-10-CM

## 2014-01-20 DIAGNOSIS — Z7902 Long term (current) use of antithrombotics/antiplatelets: Secondary | ICD-10-CM

## 2014-01-20 DIAGNOSIS — K5791 Diverticulosis of intestine, part unspecified, without perforation or abscess with bleeding: Secondary | ICD-10-CM

## 2014-01-20 DIAGNOSIS — K219 Gastro-esophageal reflux disease without esophagitis: Secondary | ICD-10-CM

## 2014-01-20 DIAGNOSIS — Z9861 Coronary angioplasty status: Secondary | ICD-10-CM

## 2014-01-20 DIAGNOSIS — K5731 Diverticulosis of large intestine without perforation or abscess with bleeding: Secondary | ICD-10-CM

## 2014-01-20 DIAGNOSIS — Z7182 Exercise counseling: Secondary | ICD-10-CM

## 2014-01-20 DIAGNOSIS — Z5181 Encounter for therapeutic drug level monitoring: Secondary | ICD-10-CM

## 2014-01-20 DIAGNOSIS — Z713 Dietary counseling and surveillance: Secondary | ICD-10-CM

## 2014-01-20 DIAGNOSIS — E785 Hyperlipidemia, unspecified: Secondary | ICD-10-CM

## 2014-01-20 NOTE — Progress Notes (Signed)
Patient ID: Shelly [name], female   DOB: 03/04/1941, 73 y.o.   MRN: [id number]      SUBJECTIVE: The patient returns for followup regarding her coronary artery disease. She was recently hospitalized for a lower GI bleed and a colonoscopy demonstrated pandiverticulosis. She was evaluated by my colleague, Dr. Burt Knack, who recommended that she stay off of Plavix for approximately 2 weeks and if there was no recurrence of GI bleed, she could safely resume Plavix but then consider stopping it after a total of 6 months from the date of prior percutaneous coronary intervention as she would be at a high risk for recurrent GI bleeds. While she has not noticed any significant GI bleeding, she has had some faint blood streaks on toilet paper occasionally. She denies exertional chest pain. Her baseline dyspnea with exertion is stable. She is trying to eat a heart healthy diet and has several questions regarding this, as does her daughter. She also has several questions about exercise. She denies abdominal pain, palpitations and dizziness.   Review of Systems: As per "subjective", otherwise negative.  Allergies  Allergen Reactions  . Bayer Advanced Aspirin [Aspirin] Palpitations    Patient can tolerate "low dose" aspirin    Current Outpatient Prescriptions  Medication Sig Dispense Refill  . aspirin EC 81 MG tablet Take 81 mg by mouth daily.      Marland Kitchen atorvastatin (LIPITOR) 80 MG tablet Take 1 tablet (80 mg total) by mouth daily at 6 PM.  30 tablet  0  . b complex vitamins capsule Take 1 capsule by mouth daily.      . clopidogrel (PLAVIX) 75 MG tablet Take 1 tablet (75 mg total) by mouth daily. HOLD FOR 2 WEEKS      . lisinopril (PRINIVIL,ZESTRIL) 20 MG tablet Take 20 mg by mouth daily.      . metoprolol succinate (TOPROL XL) 25 MG 24 hr tablet Take 1 tablet (25 mg total) by mouth daily.  30 tablet  6  . nitroGLYCERIN (NITROSTAT) 0.4 MG SL tablet Place 1 tablet (0.4 mg total) under the tongue every 5 (five)  minutes as needed for chest pain.  25 tablet  3  . oxyCODONE (OXY IR/ROXICODONE) 5 MG immediate release tablet Take 1 tablet (5 mg total) by mouth every 8 (eight) hours as needed for moderate pain.  30 tablet  0  . pantoprazole (PROTONIX) 40 MG tablet TAKE ONE TABLET BY MOUTH DAILY  30 tablet  6   No current facility-administered medications for this visit.    Past Medical History  Diagnosis Date  . Goiter   . Hypertension   . Arthritis   . Coronary artery disease     SEVERE  . Anginal pain   . Myocardial infarction 08/2013  . GERD (gastroesophageal reflux disease)   . GI bleed 12/2013    Past Surgical History  Procedure Laterality Date  . Corneal transplant    . Tonsillectomy    . Tubal ligation    . Heel spur excision    . Coronary angioplasty  08/2013  . Colonoscopy N/A 01/13/2014    Procedure: COLONOSCOPY;  Surgeon: Beryle Beams, MD;  Location: [facility] ENDOSCOPY;  Service: Endoscopy;  Laterality: N/A;    History   Social History  . Marital Status: Single    Spouse Name: N/A    Number of Children: N/A  . Years of Education: N/A   Occupational History  . Not on file.   Social History Main Topics  .  Smoking status: Never Smoker   . Smokeless tobacco: Never Used  . Alcohol Use: No  . Drug Use: No  . Sexual Activity: No   Other Topics Concern  . Not on file   Social History Narrative  . No narrative on file     Filed Vitals:   01/20/14 1407  BP: 112/56  Pulse: 76  Height: 5\' 8"  (1.727 m)  Weight: 209 lb (94.802 kg)    PHYSICAL EXAM General: NAD HEENT: Normal. Neck: No JVD, no thyromegaly. Lungs: Clear to auscultation bilaterally with normal respiratory effort. CV: Nondisplaced PMI.  Regular rate and rhythm, normal S1/S2, no S3/S4, no murmur. No pretibial or periankle edema.  No carotid bruit.  Normal pedal pulses.  Abdomen: Soft, nontender, no hepatosplenomegaly, no distention.  Neurologic: Alert and oriented x 3.  Psych: Normal affect. Skin:  Normal. Musculoskeletal: Normal range of motion, no gross deformities. Extremities: No clubbing or cyanosis.   ECG: Most recent ECG reviewed.      ASSESSMENT AND PLAN: 1. CAD s/p NSTEMI: Stable ischemic heart disease. She has residual moderate CAD but denies recurrent anginal symptoms. Continue ASA, statin, and will hold Plavix for another week. I have instructed her to begin Plavix in a week and if she were to have a recurrent GI bleed, to stop Plavix altogether and to inform me. She was unable to afford cardiac rehabilitation. Continue aspirin 81 mg, Lipitor 80 mg and Toprol-XL 25 mg. 2. GERD: Continue Protonix 40 mg daily.  3. Essential hypertension: Controlled on current therapy which includes lisinopril 20 mg daily.  4. Hyperlipidemia: Continue atorvastatin. She will need f/u lipids and LFTs at next visit. 5. Obesity: She is trying to lose weight. I gave her extensive counseling on dietary and exercise modification. I have directed her daughter to the [facility] website as well 6. GI bleed/pandiverticulosis: as per #1. I will obtain the results of most recent CBC, which she was told was normal.  Dispo: f/u 3 months.  Time spent: 40 minutes, of which greater than 50% was spent on a detailed explanation of dietary and exercise risk factor modification.  [name], M.D., F.A.C.C.

## 2014-01-20 NOTE — Patient Instructions (Addendum)
Your physician recommends that you schedule a follow-up appointment in: 3 months    HOLD Plavix 1 more week then resume  If you have any GI bleeding STOP Plavix and call our office   Please remain off of work another 2 weeks   Thank you for choosing [facility] !

## 2014-01-25 ENCOUNTER — Encounter: Payer: Self-pay | Admitting: Cardiology

## 2014-01-27 ENCOUNTER — Telehealth: Payer: Self-pay | Admitting: Cardiovascular Disease

## 2014-01-27 NOTE — Telephone Encounter (Signed)
Patient has concerns in regards to taking Plavix. / tgs

## 2014-01-27 NOTE — Telephone Encounter (Signed)
Pt will resume plavix as planned no further GIB noted.

## 2014-01-31 ENCOUNTER — Encounter: Payer: Self-pay | Admitting: Cardiovascular Disease

## 2014-04-25 ENCOUNTER — Encounter: Payer: Self-pay | Admitting: Cardiovascular Disease

## 2014-04-25 ENCOUNTER — Ambulatory Visit (INDEPENDENT_AMBULATORY_CARE_PROVIDER_SITE_OTHER): Payer: Medicare Other | Admitting: Cardiovascular Disease

## 2014-04-25 VITALS — BP 140/88 | HR 62 | Ht 62.0 in | Wt 209.2 lb

## 2014-04-25 DIAGNOSIS — I2510 Atherosclerotic heart disease of native coronary artery without angina pectoris: Secondary | ICD-10-CM

## 2014-04-25 DIAGNOSIS — Z7182 Exercise counseling: Secondary | ICD-10-CM

## 2014-04-25 DIAGNOSIS — I10 Essential (primary) hypertension: Secondary | ICD-10-CM

## 2014-04-25 DIAGNOSIS — Z955 Presence of coronary angioplasty implant and graft: Secondary | ICD-10-CM

## 2014-04-25 DIAGNOSIS — K219 Gastro-esophageal reflux disease without esophagitis: Secondary | ICD-10-CM

## 2014-04-25 DIAGNOSIS — Z719 Counseling, unspecified: Secondary | ICD-10-CM

## 2014-04-25 DIAGNOSIS — K5791 Diverticulosis of intestine, part unspecified, without perforation or abscess with bleeding: Secondary | ICD-10-CM

## 2014-04-25 DIAGNOSIS — Z713 Dietary counseling and surveillance: Secondary | ICD-10-CM

## 2014-04-25 MED ORDER — ATORVASTATIN CALCIUM 40 MG PO TABS: 40.0000 mg | ORAL_TABLET | Freq: Every day | ORAL | Status: DC

## 2014-04-25 NOTE — Progress Notes (Signed)
Patient ID: ANNAELISE RHEM, female   DOB: August 10, 1940, 73 y.o.   MRN: [id number]      SUBJECTIVE: The patient returns for follow up of CAD. She also has a history of lower GI bleed and pandiverticulosis. She and her daughter have several questions regarding dietary concerns, as well as her meds. She denies chest pain and shortness of breath, and also denies leg swelling and hematochezia/melena. She has a "small kidney stone". She has tried to eat less fried food. She likes eating peas and drinking sweet tea. She had some muscular back aches and stopped Lipitor three weeks ago, and thinks the pain is less severe.  Review of Systems: As per "subjective", otherwise negative.  Allergies  Allergen Reactions  . Bayer Advanced Aspirin [Aspirin] Palpitations    Patient can tolerate "low dose" aspirin    Current Outpatient Prescriptions  Medication Sig Dispense Refill  . aspirin EC 81 MG tablet Take 81 mg by mouth daily.    Marland Kitchen atorvastatin (LIPITOR) 80 MG tablet Take 1 tablet (80 mg total) by mouth daily at 6 PM. 30 tablet 0  . b complex vitamins capsule Take 1 capsule by mouth daily.    . clopidogrel (PLAVIX) 75 MG tablet Take 1 tablet (75 mg total) by mouth daily. HOLD FOR 2 WEEKS    . lisinopril (PRINIVIL,ZESTRIL) 20 MG tablet Take 20 mg by mouth daily.    . metoprolol succinate (TOPROL XL) 25 MG 24 hr tablet Take 1 tablet (25 mg total) by mouth daily. 30 tablet 6  . nitroGLYCERIN (NITROSTAT) 0.4 MG SL tablet Place 1 tablet (0.4 mg total) under the tongue every 5 (five) minutes as needed for chest pain. 25 tablet 3  . oxyCODONE (OXY IR/ROXICODONE) 5 MG immediate release tablet Take 1 tablet (5 mg total) by mouth every 8 (eight) hours as needed for moderate pain. 30 tablet 0  . pantoprazole (PROTONIX) 40 MG tablet TAKE ONE TABLET BY MOUTH DAILY 30 tablet 6   No current facility-administered medications for this visit.    Past Medical History  Diagnosis Date  . Goiter   . Hypertension   .  Arthritis   . Coronary artery disease     SEVERE  . Anginal pain   . Myocardial infarction 08/2013  . GERD (gastroesophageal reflux disease)   . GI bleed 12/2013    Past Surgical History  Procedure Laterality Date  . Corneal transplant    . Tonsillectomy    . Tubal ligation    . Heel spur excision    . Coronary angioplasty  08/2013  . Colonoscopy N/A 01/13/2014    Procedure: COLONOSCOPY;  Surgeon: Beryle Beams, MD;  Location: [facility] ENDOSCOPY;  Service: Endoscopy;  Laterality: N/A;    History   Social History  . Marital Status: Single    Spouse Name: N/A    Number of Children: N/A  . Years of Education: N/A   Occupational History  . Not on file.   Social History Main Topics  . Smoking status: Never Smoker   . Smokeless tobacco: Never Used  . Alcohol Use: No  . Drug Use: No  . Sexual Activity: No   Other Topics Concern  . Not on file   Social History Narrative     Filed Vitals:   04/25/14 1423  BP: 140/88  Pulse: 62  Height: 5\' 2"  (1.575 m)  Weight: 209 lb 3.2 oz (94.892 kg)    PHYSICAL EXAM General: NAD HEENT: Normal. Neck: No JVD,  no thyromegaly. Lungs: Clear to auscultation bilaterally with normal respiratory effort. CV: Nondisplaced PMI.  Regular rate and rhythm, normal S1/S2, no S3/S4, no murmur. No pretibial or periankle edema.  No carotid bruit.  Normal pedal pulses.  Abdomen: Soft, nontender, no hepatosplenomegaly, no distention.  Neurologic: Alert and oriented x 3.  Psych: Normal affect. Skin: Normal. Musculoskeletal: Normal range of motion, no gross deformities. Extremities: No clubbing or cyanosis.   ECG: Most recent ECG reviewed.      ASSESSMENT AND PLAN: 1. CAD s/p NSTEMI: Stable ischemic heart disease. She has residual moderate CAD but denies recurrent anginal symptoms. Continue ASA, Lipitor (will reduce dose to 40 mg due to "myalgias"), Toprol-XL, and Plavix. I instructed her that if she were to have a recurrent GI bleed, to stop Plavix  altogether and to inform me.  2. GERD: Continue Protonix 40 mg daily.  3. Essential hypertension: Borderline controlled on current therapy which includes lisinopril 20 mg daily.  4. Hyperlipidemia: Continue atorvastatin at reduced dose of 40 mg. She will need f/u lipids and LFTs which I will check at next visit. 5. Obesity: She is trying to lose weight. I again gave her extensive counseling on dietary and exercise modification. I again directed her daughter to the [employer] website as well. 6. Lower GI bleed/diverticulosis: No recurrences.  Dispo: f/u 6 months in [place] office.  [name], M.D., F.A.C.C.

## 2014-04-25 NOTE — Patient Instructions (Signed)
Your physician wants you to follow-up in: 6 months in [place] office You will receive a reminder letter in the mail two months in advance. If you don't receive a letter, please call our office to schedule the follow-up appointment.   DECREASE Lipitor to 40 mg daily    Please call back in 3 weeks and let us known how you are tolerating Lipitor     Thank you for choosing [facility] !

## 2014-04-27 ENCOUNTER — Encounter (HOSPITAL_COMMUNITY): Payer: Self-pay | Admitting: Cardiology

## 2014-05-20 ENCOUNTER — Other Ambulatory Visit: Payer: Self-pay | Admitting: Cardiovascular Disease

## 2014-07-31 ENCOUNTER — Encounter (HOSPITAL_COMMUNITY): Payer: Self-pay | Admitting: Emergency Medicine

## 2014-07-31 DIAGNOSIS — Z79899 Other long term (current) drug therapy: Secondary | ICD-10-CM | POA: Diagnosis not present

## 2014-07-31 DIAGNOSIS — M1990 Unspecified osteoarthritis, unspecified site: Secondary | ICD-10-CM | POA: Insufficient documentation

## 2014-07-31 DIAGNOSIS — Z7982 Long term (current) use of aspirin: Secondary | ICD-10-CM | POA: Diagnosis not present

## 2014-07-31 DIAGNOSIS — I10 Essential (primary) hypertension: Secondary | ICD-10-CM | POA: Insufficient documentation

## 2014-07-31 DIAGNOSIS — R109 Unspecified abdominal pain: Secondary | ICD-10-CM | POA: Diagnosis present

## 2014-07-31 DIAGNOSIS — K219 Gastro-esophageal reflux disease without esophagitis: Secondary | ICD-10-CM | POA: Diagnosis not present

## 2014-07-31 DIAGNOSIS — Z9861 Coronary angioplasty status: Secondary | ICD-10-CM | POA: Diagnosis not present

## 2014-07-31 DIAGNOSIS — I25119 Atherosclerotic heart disease of native coronary artery with unspecified angina pectoris: Secondary | ICD-10-CM | POA: Insufficient documentation

## 2014-07-31 DIAGNOSIS — Z9851 Tubal ligation status: Secondary | ICD-10-CM | POA: Diagnosis not present

## 2014-07-31 DIAGNOSIS — M545 Low back pain: Secondary | ICD-10-CM | POA: Insufficient documentation

## 2014-07-31 DIAGNOSIS — Z9889 Other specified postprocedural states: Secondary | ICD-10-CM | POA: Insufficient documentation

## 2014-07-31 DIAGNOSIS — Z8639 Personal history of other endocrine, nutritional and metabolic disease: Secondary | ICD-10-CM | POA: Diagnosis not present

## 2014-07-31 DIAGNOSIS — I252 Old myocardial infarction: Secondary | ICD-10-CM | POA: Diagnosis not present

## 2014-07-31 DIAGNOSIS — Z7901 Long term (current) use of anticoagulants: Secondary | ICD-10-CM | POA: Insufficient documentation

## 2014-07-31 NOTE — ED Notes (Signed)
Patient complaining of left flank pain for about a week that has worsened. States she is urinating frequently.

## 2014-08-01 ENCOUNTER — Emergency Department (HOSPITAL_COMMUNITY)
Admission: EM | Admit: 2014-08-01 | Discharge: 2014-08-01 | Disposition: A | Discharge status: Home / Self Care | Payer: Medicare Other | Attending: Emergency Medicine | Admitting: Emergency Medicine

## 2014-08-01 DIAGNOSIS — M545 Low back pain, unspecified: Secondary | ICD-10-CM

## 2014-08-01 LAB — COMPREHENSIVE METABOLIC PANEL
ALBUMIN: 3.6 g/dL (ref 3.5–5.2)
ALT: 13 U/L (ref 0–35)
ANION GAP: 6 (ref 5–15)
AST: 16 U/L (ref 0–37)
Alkaline Phosphatase: 75 U/L (ref 39–117)
BUN: 23 mg/dL (ref 6–23)
CALCIUM: 9.3 mg/dL (ref 8.4–10.5)
CO2: 25 mmol/L (ref 19–32)
CREATININE: 1.21 mg/dL — AB (ref 0.50–1.10)
Chloride: 110 mmol/L (ref 96–112)
GFR, EST AFRICAN AMERICAN: 50 mL/min — AB (ref >90)
GFR, EST NON AFRICAN AMERICAN: 43 mL/min — AB (ref >90)
Glucose, Bld: 107 mg/dL — ABNORMAL HIGH (ref 70–99)
Potassium: 4.4 mmol/L (ref 3.5–5.1)
Sodium: 141 mmol/L (ref 135–145)
Total Bilirubin: 0.5 mg/dL (ref 0.3–1.2)
Total Protein: 7.3 g/dL (ref 6.0–8.3)

## 2014-08-01 LAB — URINALYSIS, ROUTINE W REFLEX MICROSCOPIC
Bilirubin Urine: NEGATIVE
Glucose, UA: NEGATIVE mg/dL
HGB URINE DIPSTICK: NEGATIVE
KETONES UR: NEGATIVE mg/dL
LEUKOCYTES UA: NEGATIVE
Nitrite: NEGATIVE
Protein, ur: NEGATIVE mg/dL
Specific Gravity, Urine: <1.005 — ABNORMAL LOW (ref 1.005–1.030)
UROBILINOGEN UA: 0.2 mg/dL (ref 0.0–1.0)
pH: 6 (ref 5.0–8.0)

## 2014-08-01 LAB — CBC WITH DIFFERENTIAL/PLATELET
BASOS ABS: 0 10*3/uL (ref 0.0–0.1)
BASOS PCT: 0 % (ref 0–1)
EOS PCT: 1 % (ref 0–5)
Eosinophils Absolute: 0.1 10*3/uL (ref 0.0–0.7)
HCT: 34 % — ABNORMAL LOW (ref 36.0–46.0)
Hemoglobin: 10.9 g/dL — ABNORMAL LOW (ref 12.0–15.0)
LYMPHS PCT: 32 % (ref 12–46)
Lymphs Abs: 1.9 10*3/uL (ref 0.7–4.0)
MCH: 29.9 pg (ref 26.0–34.0)
MCHC: 32.1 g/dL (ref 30.0–36.0)
MCV: 93.2 fL (ref 78.0–100.0)
Monocytes Absolute: 0.5 10*3/uL (ref 0.1–1.0)
Monocytes Relative: 9 % (ref 3–12)
Neutro Abs: 3.5 10*3/uL (ref 1.7–7.7)
Neutrophils Relative %: 58 % (ref 43–77)
PLATELETS: 266 10*3/uL (ref 150–400)
RBC: 3.65 MIL/uL — ABNORMAL LOW (ref 3.87–5.11)
RDW: 14.9 % (ref 11.5–15.5)
WBC: 6 10*3/uL (ref 4.0–10.5)

## 2014-08-01 MED ORDER — TRAMADOL HCL 50 MG PO TABS
50.0000 mg | ORAL_TABLET | Freq: Once | ORAL | Status: AC
  Administered 2014-08-01: 50 mg via ORAL
  Filled 2014-08-01: qty 1

## 2014-08-01 MED ORDER — METHOCARBAMOL 500 MG PO TABS
1000.0000 mg | ORAL_TABLET | Freq: Once | ORAL | Status: AC
  Administered 2014-08-01: 1000 mg via ORAL
  Filled 2014-08-01: qty 2

## 2014-08-01 MED ORDER — METHOCARBAMOL 500 MG PO TABS: 1000.0000 mg | ORAL_TABLET | Freq: Three times a day (TID) | ORAL | Status: DC | PRN

## 2014-08-01 MED ORDER — TRAMADOL HCL 50 MG PO TABS: 50.0000 mg | ORAL_TABLET | Freq: Four times a day (QID) | ORAL | Status: DC | PRN

## 2014-08-01 NOTE — ED Provider Notes (Signed)
CSN: [id number]     Arrival date & time 07/31/14  2324 History  This chart was scribed for Julianne Rice, MD by Shelly [name], ED Scribe. This patient was seen in room APA02/APA02 and the patient's care was started at 12:18 AM.    Chief Complaint  Patient presents with  . Flank Pain   Patient is a 74 y.o. female presenting with flank pain. The history is provided by the patient. No language interpreter was used.  Flank Pain This is a new problem. The current episode started more than 1 week ago. The problem occurs daily. Pertinent negatives include no chest pain, no abdominal pain, no headaches and no shortness of breath. Nothing aggravates the symptoms. Nothing relieves the symptoms. She has tried nothing for the symptoms. The treatment provided no relief.    HPI Comments: Shelly [name] is a 74 y.o. female who presents to the Emergency Department complaining of intermittent, moderate left flank pain that radiates to her left hip and started 1 week ago. Pt states decreased appetite and urinary frequency as associated symptoms. She also reports baseline left knee pain that is unchanged today. She has tried Oxycodone with some relief. Pt had a CT scan done at [facility] 5 days ago that was negative for kidney stones. She also has a history of UTI and states current symptoms are similar to prior infections. Pt denies hematuria.   Past Medical History  Diagnosis Date  . Goiter   . Hypertension   . Arthritis   . Coronary artery disease     SEVERE  . Anginal pain   . Myocardial infarction 08/2013  . GERD (gastroesophageal reflux disease)   . GI bleed 12/2013   Past Surgical History  Procedure Laterality Date  . Corneal transplant    . Tonsillectomy    . Tubal ligation    . Heel spur excision    . Coronary angioplasty  08/2013  . Colonoscopy N/A 01/13/2014    Procedure: COLONOSCOPY;  Surgeon: Beryle Beams, MD;  Location: [facility] ENDOSCOPY;  Service: Endoscopy;  Laterality: N/A;  . Left  heart catheterization with coronary angiogram N/A 08/23/2013    Procedure: LEFT HEART CATHETERIZATION WITH CORONARY ANGIOGRAM;  Surgeon: Peter M Martinique, MD;  Location: [facility] CATH LAB;  Service: Cardiovascular;  Laterality: N/A;  . Percutaneous coronary stent intervention (pci-s) N/A 08/24/2013    Procedure: PERCUTANEOUS CORONARY STENT INTERVENTION (PCI-S);  Surgeon: Peter M Martinique, MD;  Location: [facility] CATH LAB;  Service: Cardiovascular;  Laterality: N/A;   History reviewed. No pertinent family history. History  Substance Use Topics  . Smoking status: Never Smoker   . Smokeless tobacco: Never Used  . Alcohol Use: No   OB History    No data available     Review of Systems  Constitutional: Negative for fever and chills.  Respiratory: Negative for shortness of breath.   Cardiovascular: Negative for chest pain.  Gastrointestinal: Negative for nausea, vomiting, abdominal pain, diarrhea and constipation.  Genitourinary: Positive for frequency and flank pain. Negative for dysuria, hematuria and difficulty urinating.  Musculoskeletal: Positive for back pain. Negative for myalgias, neck pain and neck stiffness.  Neurological: Negative for dizziness, weakness, numbness and headaches.  All other systems reviewed and are negative.  Allergies  Bayer advanced aspirin  Home Medications   Prior to Admission medications   Medication Sig Start Date End Date Taking? Authorizing Provider  aspirin EC 81 MG tablet Take 81 mg by mouth daily.   Yes Historical Provider, MD  atorvastatin (LIPITOR) 40 MG tablet Take 1 tablet (40 mg total) by mouth daily at 6 PM. 04/25/14  Yes Herminio Commons, MD  b complex vitamins capsule Take 1 capsule by mouth daily.   Yes Historical Provider, MD  clopidogrel (PLAVIX) 75 MG tablet Take 1 tablet (75 mg total) by mouth daily. HOLD FOR 2 WEEKS 01/13/14  Yes Ripudeep Krystal Eaton, MD  diclofenac (VOLTAREN) 75 MG EC tablet Take 75 mg by mouth 2 (two) times daily.   Yes Historical Provider,  MD  lisinopril (PRINIVIL,ZESTRIL) 20 MG tablet Take 20 mg by mouth daily. 07/25/13  Yes Historical Provider, MD  metoprolol succinate (TOPROL-XL) 25 MG 24 hr tablet TAKE ONE TABLET BY MOUTH DAILY 05/22/14  Yes Herminio Commons, MD  nitroGLYCERIN (NITROSTAT) 0.4 MG SL tablet Place 1 tablet (0.4 mg total) under the tongue every 5 (five) minutes as needed for chest pain. 09/07/13  Yes Liliane Shi, PA-C  oxyCODONE (OXY IR/ROXICODONE) 5 MG immediate release tablet Take 1 tablet (5 mg total) by mouth every 8 (eight) hours as needed for moderate pain. 01/13/14  Yes Ripudeep Krystal Eaton, MD  pantoprazole (PROTONIX) 40 MG tablet TAKE ONE TABLET BY MOUTH DAILY 01/17/14  Yes Herminio Commons, MD  methocarbamol (ROBAXIN) 500 MG tablet Take 2 tablets (1,000 mg total) by mouth every 8 (eight) hours as needed for muscle spasms. 08/01/14   Julianne Rice, MD  traMADol (ULTRAM) 50 MG tablet Take 1 tablet (50 mg total) by mouth every 6 (six) hours as needed. 08/01/14   Julianne Rice, MD   BP 129/83 mmHg  Pulse 87  Temp(Src) 99 F (37.2 C) (Oral)  Ht 5\' 2"  (1.575 m)  Wt 214 lb (97.07 kg)  BMI 39.13 kg/m2  SpO2 100% Physical Exam  Constitutional: She is oriented to person, place, and time. She appears well-developed and well-nourished. No distress.  HENT:  Head: Normocephalic and atraumatic.  Mouth/Throat: Oropharynx is clear and moist.  Eyes: EOM are normal. Pupils are equal, round, and reactive to light.  Neck: Normal range of motion. Neck supple.  Cardiovascular: Normal rate and regular rhythm.   Pulmonary/Chest: Effort normal and breath sounds normal. No respiratory distress. She has no wheezes. She has no rales.  Abdominal: Soft. Bowel sounds are normal. She exhibits no distension and no mass. There is no tenderness. There is no rebound and no guarding.  Musculoskeletal: Normal range of motion. She exhibits no edema or tenderness.  No CVA tenderness bilaterally. No midline thoracic or lumbar tenderness.  Distal pulses intact.  Neurological: She is alert and oriented to person, place, and time.  5/5 motor in all extremities. Sensation is fully intact.  Skin: Skin is warm and dry. No rash noted. No erythema.  Psychiatric: She has a normal mood and affect. Her behavior is normal.  Nursing note and vitals reviewed.   ED Course  Procedures  DIAGNOSTIC STUDIES: Oxygen Saturation is 100% on RA, normal by my interpretation.    COORDINATION OF CARE: 12:24 AM Discussed treatment plan with pt at bedside and pt agreed to plan.  Labs Review Labs Reviewed  URINALYSIS, ROUTINE W REFLEX MICROSCOPIC - Abnormal; Notable for the following:    Specific Gravity, Urine <1.005 (*)    All other components within normal limits  CBC WITH DIFFERENTIAL/PLATELET - Abnormal; Notable for the following:    RBC 3.65 (*)    Hemoglobin 10.9 (*)    HCT 34.0 (*)    All other components within normal limits  COMPREHENSIVE METABOLIC PANEL - Abnormal; Notable for the following:    Glucose, Bld 107 (*)    Creatinine, Ser 1.21 (*)    GFR calc non Af Amer 43 (*)    GFR calc Af Amer 50 (*)    All other components within normal limits    Imaging Review No results found.   EKG Interpretation None      MDM   Final diagnoses:  Left-sided low back pain without sciatica    I personally performed the services described in this documentation, which was scribed in my presence. The recorded information has been reviewed and is accurate.  Patient is very well-appearing. She has a normal neurologic exam. Head CT scan without contrast several days ago which was negative for any abnormal finding. Normal caliber aorta. Patient states the pain starts in the left lumbar region and radiates into the left buttock. Question possible radicular pain. Change pain medication and advised follow-up with her primary physician. She's been given return precautions and has voiced understanding.   Julianne Rice, MD 08/01/14 [phone]

## 2014-08-01 NOTE — Discharge Instructions (Signed)

## 2014-08-17 ENCOUNTER — Other Ambulatory Visit: Payer: Self-pay | Admitting: Cardiovascular Disease

## 2014-09-19 ENCOUNTER — Other Ambulatory Visit: Payer: Self-pay | Admitting: Physician Assistant

## 2014-10-23 ENCOUNTER — Ambulatory Visit: Payer: Medicare Other | Admitting: Cardiovascular Disease

## 2014-10-25 ENCOUNTER — Encounter: Payer: Self-pay | Admitting: Cardiovascular Disease

## 2014-10-25 ENCOUNTER — Ambulatory Visit (INDEPENDENT_AMBULATORY_CARE_PROVIDER_SITE_OTHER): Payer: Medicare Other | Admitting: Cardiovascular Disease

## 2014-10-25 VITALS — BP 124/70 | HR 65 | Ht 62.0 in | Wt 193.0 lb

## 2014-10-25 DIAGNOSIS — I2510 Atherosclerotic heart disease of native coronary artery without angina pectoris: Secondary | ICD-10-CM

## 2014-10-25 DIAGNOSIS — I10 Essential (primary) hypertension: Secondary | ICD-10-CM

## 2014-10-25 DIAGNOSIS — E785 Hyperlipidemia, unspecified: Secondary | ICD-10-CM

## 2014-10-25 DIAGNOSIS — I252 Old myocardial infarction: Secondary | ICD-10-CM

## 2014-10-25 NOTE — Progress Notes (Signed)
Patient ID: Shelly [name], female   DOB: 18-Jun-1940, 74 y.o.   MRN: [id number]      SUBJECTIVE: The patient returns for follow up of CAD. She also has a history of lower GI bleed and pandiverticulosis. She denies chest pain and shortness of breath, and also denies leg swelling and hematochezia/melena. She denies chest pain. She seldom has palpitations. She has been trying to eat right Back on greasy and fried foods. She does some exercise. She has been bothered by left hip and thigh cramping.  ECG performed in the office today demonstrates normal sinus rhythm with no ischemic ST segment or T-wave abnormalities.   Review of Systems: As per "subjective", otherwise negative.  Allergies  Allergen Reactions  . Bayer Advanced Aspirin [Aspirin] Palpitations    Patient can tolerate "low dose" aspirin  . Codeine Other (See Comments)    Makes her feel "high"    Current Outpatient Prescriptions  Medication Sig Dispense Refill  . aspirin EC 81 MG tablet Take 81 mg by mouth daily.    Marland Kitchen atorvastatin (LIPITOR) 40 MG tablet Take 1 tablet (40 mg total) by mouth daily at 6 PM. 90 tablet 3  . b complex vitamins capsule Take 1 capsule by mouth daily.    . clopidogrel (PLAVIX) 75 MG tablet Take 75 mg by mouth daily.  3  . diclofenac (VOLTAREN) 75 MG EC tablet Take 75 mg by mouth 2 (two) times daily.    Marland Kitchen lisinopril (PRINIVIL,ZESTRIL) 40 MG tablet Take 40 mg by mouth daily.  6  . metoprolol succinate (TOPROL-XL) 25 MG 24 hr tablet TAKE ONE TABLET BY MOUTH DAILY 30 tablet 6  . nitroGLYCERIN (NITROSTAT) 0.4 MG SL tablet Place 1 tablet (0.4 mg total) under the tongue every 5 (five) minutes as needed for chest pain. 25 tablet 3  . pantoprazole (PROTONIX) 40 MG tablet TAKE ONE TABLET BY MOUTH DAILY 30 tablet 6  . tiZANidine (ZANAFLEX) 4 MG tablet Take 4 mg by mouth at bedtime.  3   No current facility-administered medications for this visit.    Past Medical History  Diagnosis Date  . Goiter   .  Hypertension   . Arthritis   . Coronary artery disease     SEVERE  . Anginal pain   . Myocardial infarction 08/2013  . GERD (gastroesophageal reflux disease)   . GI bleed 12/2013    Past Surgical History  Procedure Laterality Date  . Corneal transplant    . Tonsillectomy    . Tubal ligation    . Heel spur excision    . Coronary angioplasty  08/2013  . Colonoscopy N/A 01/13/2014    Procedure: COLONOSCOPY;  Surgeon: Beryle Beams, MD;  Location: [facility] ENDOSCOPY;  Service: Endoscopy;  Laterality: N/A;  . Left heart catheterization with coronary angiogram N/A 08/23/2013    Procedure: LEFT HEART CATHETERIZATION WITH CORONARY ANGIOGRAM;  Surgeon: Peter M Martinique, MD;  Location: [facility] CATH LAB;  Service: Cardiovascular;  Laterality: N/A;  . Percutaneous coronary stent intervention (pci-s) N/A 08/24/2013    Procedure: PERCUTANEOUS CORONARY STENT INTERVENTION (PCI-S);  Surgeon: Peter M Martinique, MD;  Location: [facility] CATH LAB;  Service: Cardiovascular;  Laterality: N/A;    History   Social History  . Marital Status: Single    Spouse Name: N/A  . Number of Children: N/A  . Years of Education: N/A   Occupational History  . Not on file.   Social History Main Topics  . Smoking status: Never Smoker   .  Smokeless tobacco: Never Used  . Alcohol Use: No  . Drug Use: No  . Sexual Activity: No   Other Topics Concern  . Not on file   Social History Narrative     Filed Vitals:   10/25/14 0857  BP: 124/70  Pulse: 65  Height: 5\' 2"  (1.575 m)  Weight: 193 lb (87.544 kg)    PHYSICAL EXAM General: NAD HEENT: Normal. Neck: No JVD, no thyromegaly. Lungs: Clear to auscultation bilaterally with normal respiratory effort. CV: Nondisplaced PMI.  Regular rate and rhythm, normal S1/S2, no S3/S4, no murmur. No pretibial or periankle edema.  No carotid bruit.   Abdomen: Soft, nontender, obese, no distention.  Neurologic: Alert and oriented x 3.  Psych: Normal affect. Skin: Normal. Musculoskeletal: No  gross deformities. Extremities: No clubbing or cyanosis.   ECG: Most recent ECG reviewed.      ASSESSMENT AND PLAN: 1. CAD s/p NSTEMI: Stable ischemic heart disease. She has residual moderate CAD but denies recurrent anginal symptoms. Continue ASA, Lipitor, and Toprol-XL. As stents were placed on 08/24/13, I will discontinue Plavix.  2. GERD: Continue Protonix 40 mg daily.   3. Essential hypertension: Well controlled on current therapy which includes lisinopril 40 mg daily. No changes.  4. Hyperlipidemia: Continue atorvastatin at reduced dose of 40 mg. Will check lipids.  5. Obesity: She is trying to lose weight and eat right.  6. Lower GI bleed/diverticulosis: No recurrences. D/c Plavix.  Dispo: f/u 9 months.   Shelly [name], M.D., F.A.C.C.

## 2014-10-25 NOTE — Patient Instructions (Signed)
   Stop Plavix. Continue all other medications.   Lab for Lipids - Reminder:  Nothing to eat or drink after 12 midnight prior to labs. Office will contact with results via phone or letter.   Your physician wants you to follow up in: 9 months.  You will receive a reminder letter in the mail one-two months in advance.  If you don't receive a letter, please call our office to schedule the follow up appointment

## 2014-11-09 ENCOUNTER — Telehealth: Payer: Self-pay | Admitting: *Deleted

## 2014-11-09 NOTE — Telephone Encounter (Signed)
-----   Message from Herminio Commons, MD sent at 11/07/2014  9:47 AM EDT ----- Good results.

## 2014-11-09 NOTE — Telephone Encounter (Signed)
Notes Recorded by Laurine Blazer, LPN on [id number] at [id number] AM Patient notified, copy fwd to PMD.

## 2014-12-18 ENCOUNTER — Other Ambulatory Visit: Payer: Self-pay | Admitting: Cardiovascular Disease

## 2015-02-19 ENCOUNTER — Other Ambulatory Visit: Payer: Self-pay | Admitting: Cardiovascular Disease

## 2015-03-21 ENCOUNTER — Other Ambulatory Visit: Payer: Self-pay | Admitting: Cardiovascular Disease

## 2015-04-16 ENCOUNTER — Other Ambulatory Visit: Payer: Self-pay | Admitting: Physician Assistant

## 2015-04-16 ENCOUNTER — Other Ambulatory Visit: Payer: Self-pay | Admitting: *Deleted

## 2015-04-16 DIAGNOSIS — I214 Non-ST elevation (NSTEMI) myocardial infarction: Secondary | ICD-10-CM

## 2015-04-16 MED ORDER — NITROGLYCERIN 0.4 MG SL SUBL: 0.4000 mg | SUBLINGUAL_TABLET | SUBLINGUAL | Status: DC | PRN

## 2015-06-18 ENCOUNTER — Emergency Department (HOSPITAL_COMMUNITY)
Admission: EM | Admit: 2015-06-18 | Discharge: 2015-06-18 | Disposition: A | Discharge status: Home / Self Care | Payer: Medicare Other | Attending: Emergency Medicine | Admitting: Emergency Medicine

## 2015-06-18 ENCOUNTER — Encounter (HOSPITAL_COMMUNITY): Payer: Self-pay | Admitting: Family Medicine

## 2015-06-18 DIAGNOSIS — Z792 Long term (current) use of antibiotics: Secondary | ICD-10-CM | POA: Insufficient documentation

## 2015-06-18 DIAGNOSIS — Z79899 Other long term (current) drug therapy: Secondary | ICD-10-CM | POA: Diagnosis not present

## 2015-06-18 DIAGNOSIS — M1990 Unspecified osteoarthritis, unspecified site: Secondary | ICD-10-CM | POA: Diagnosis not present

## 2015-06-18 DIAGNOSIS — Z7982 Long term (current) use of aspirin: Secondary | ICD-10-CM | POA: Diagnosis not present

## 2015-06-18 DIAGNOSIS — I25119 Atherosclerotic heart disease of native coronary artery with unspecified angina pectoris: Secondary | ICD-10-CM | POA: Diagnosis not present

## 2015-06-18 DIAGNOSIS — K219 Gastro-esophageal reflux disease without esophagitis: Secondary | ICD-10-CM | POA: Insufficient documentation

## 2015-06-18 DIAGNOSIS — Z4801 Encounter for change or removal of surgical wound dressing: Secondary | ICD-10-CM | POA: Insufficient documentation

## 2015-06-18 DIAGNOSIS — Z5189 Encounter for other specified aftercare: Secondary | ICD-10-CM

## 2015-06-18 DIAGNOSIS — I10 Essential (primary) hypertension: Secondary | ICD-10-CM | POA: Diagnosis not present

## 2015-06-18 DIAGNOSIS — I252 Old myocardial infarction: Secondary | ICD-10-CM | POA: Diagnosis not present

## 2015-06-18 NOTE — ED Notes (Signed)
Declined W/C at D/C and was escorted to lobby by RN. 

## 2015-06-18 NOTE — Discharge Instructions (Signed)
Your abscess appears to be healing very well. Please follow up with your primary care provider as scheduled next week. In the meantime continue taking your antibiotic as prescribed until you finish the bottle. Return to the ER for new or worsening symptoms.

## 2015-06-18 NOTE — ED Notes (Signed)
Pt here for abscess in between her breasts. Pt sts she has been on abx and it has significantly improved. sts she wants a recheck to be sure she is okay.

## 2015-06-18 NOTE — ED Provider Notes (Signed)
CSN: [id number]     Arrival date & time 06/18/15  1215 History   By signing my name below, I, Nicole Kindred, attest that this documentation has been prepared under the direction and in the presence of Shanaye Rief, [place]. Electronically Signed: Nicole Kindred, ED Scribe 06/18/2015 at 1:24 PM.    Chief Complaint  Patient presents with  . Abscess    The history is provided by the patient. No language interpreter was used.   HPI Comments: Shelly [name] is a 75 y.o. female who presents to the Emergency Department complaining of gradual onset abscess to the upper, central abdomen, onset about 7 days ago. Pt saw her PCP in [place] and was treated with antibiotics but did not have the abscess drained surgically. She notes improvements to the area since beginning her antibiotics. States the area drained spontaneously several days ago. Pt reports having subjective fever, significant pressure, and mild drainage to the area when symptoms first began but states that the symptoms have subsided with treatment. Pt denies chills, nausea, vomiting, pain, or any other associated symptoms. She states she is here to make sure wound is healing okay. She has PCP follow up scheduled for next week.  Past Medical History  Diagnosis Date  . Goiter   . Hypertension   . Arthritis   . Coronary artery disease     SEVERE  . Anginal pain ([place])   . Myocardial infarction ([place]) 08/2013  . GERD (gastroesophageal reflux disease)   . GI bleed 12/2013   Past Surgical History  Procedure Laterality Date  . Corneal transplant    . Tonsillectomy    . Tubal ligation    . Heel spur excision    . Coronary angioplasty  08/2013  . Colonoscopy N/A 01/13/2014    Procedure: COLONOSCOPY;  Surgeon: Beryle Beams, MD;  Location: [facility] ENDOSCOPY;  Service: Endoscopy;  Laterality: N/A;  . Left heart catheterization with coronary angiogram N/A 08/23/2013    Procedure: LEFT HEART CATHETERIZATION WITH CORONARY ANGIOGRAM;  Surgeon: Peter M  Martinique, MD;  Location: [facility] CATH LAB;  Service: Cardiovascular;  Laterality: N/A;  . Percutaneous coronary stent intervention (pci-s) N/A 08/24/2013    Procedure: PERCUTANEOUS CORONARY STENT INTERVENTION (PCI-S);  Surgeon: Peter M Martinique, MD;  Location: [facility] CATH LAB;  Service: Cardiovascular;  Laterality: N/A;   History reviewed. No pertinent family history. Social History  Substance Use Topics  . Smoking status: Never Smoker   . Smokeless tobacco: Never Used  . Alcohol Use: No   OB History    No data available     Review of Systems  Constitutional: Negative for fever and chills.  Gastrointestinal: Negative for nausea and vomiting.  Musculoskeletal: Negative for myalgias.  Skin:       Abscess to upper, central abdomen.  All other systems reviewed and are negative.   Allergies  Bayer advanced aspirin and Codeine  Home Medications   Prior to Admission medications   Medication Sig Start Date End Date Taking? Authorizing Provider  aspirin EC 81 MG tablet Take 81 mg by mouth daily.    Historical Provider, MD  atorvastatin (LIPITOR) 40 MG tablet TAKE ONE TABLET BY MOUTH DAILY AT 6 PM. 03/21/15   Herminio Commons, MD  b complex vitamins capsule Take 1 capsule by mouth daily.    Historical Provider, MD  diclofenac (VOLTAREN) 75 MG EC tablet Take 75 mg by mouth 2 (two) times daily.    Historical Provider, MD  lisinopril (PRINIVIL,ZESTRIL) 40 MG  tablet Take 40 mg by mouth daily. 10/10/14   Historical Provider, MD  metoprolol succinate (TOPROL-XL) 25 MG 24 hr tablet TAKE ONE TABLET BY MOUTH DAILY 12/19/14   Herminio Commons, MD  nitroGLYCERIN (NITROSTAT) 0.4 MG SL tablet Place 1 tablet (0.4 mg total) under the tongue every 5 (five) minutes as needed for chest pain. 04/16/15   Herminio Commons, MD  pantoprazole (PROTONIX) 40 MG tablet TAKE ONE TABLET BY MOUTH DAILY 02/19/15   Herminio Commons, MD  tiZANidine (ZANAFLEX) 4 MG tablet Take 4 mg by mouth at bedtime. 10/20/14   Historical  Provider, MD   BP 121/60 mmHg  Pulse 76  Temp(Src) 97.6 F (36.4 C) (Oral)  Resp 18  SpO2 99% Physical Exam  Constitutional: She is oriented to person, place, and time. She appears well-developed and well-nourished.  HENT:  Head: Normocephalic.  Eyes: EOM are normal.  Neck: Normal range of motion.  Pulmonary/Chest: Effort normal.  Abdominal: She exhibits no distension.  Musculoskeletal: Normal range of motion.  Neurological: She is alert and oriented to person, place, and time.  Skin:  Well healing lesion superficial to xiphoid process in between breasts. Minimal serosanguinous drainage when pressure is applied as there is an open area where lesion drained spontaneously earlier this week. Nontender. There is some scar tissue.   Psychiatric: She has a normal mood and affect.  Nursing note and vitals reviewed.   ED Course  Procedures (including critical care time) DIAGNOSTIC STUDIES: Oxygen Saturation is 99% on RA, normal by my interpretation.    COORDINATION OF CARE: 1:24 PM-Discussed treatment plan which includes completing antibiotic prescription and follow up with PCP with pt at bedside and pt agreed to plan.   Labs Review Labs Reviewed - No data to display  Imaging Review No results found. I have personally reviewed and evaluated these images and lab results as part of my medical decision-making.   EKG Interpretation None      MDM   Final diagnoses:  Encounter for wound re-check   Pt is presenting for wound check of reported abscess that is currently being treated with PO Bactrim prescribed by PCP. Pt has a few more days of abx with PCP scheduled for next week. The area in question appears to have drained earlier this week and there is no fluctuance palpable that suggests further incision and drainage is necessary at this time. There is no erythema or tenderness. Pt is afebrile and otherwise well appearing. She is not in pain. Instructed to finish Bactrim as  prescribed. Instructed to f/u with PCP as scheduled. ER return precautions given. Pt verbalized her understanding and agreement.   I personally performed the services described in this documentation, which was scribed in my presence. The recorded information has been reviewed and is accurate.     Anne Ng, PA-C 06/18/15 1514  Orlie Dakin, MD 06/18/15 1757

## 2015-06-27 ENCOUNTER — Ambulatory Visit: Payer: Self-pay | Admitting: Surgery

## 2015-06-27 NOTE — H&P (Signed)
History of Present Illness Shelly [name]. Shelly Skibicki MD; 06/27/2015 5:25 PM) Patient words: abscess.  The patient is a 75 year old female who presents with a complaint of Mass. Referred by Dr. [name] for evaluation of chest sebaceous cyst  This is a 75 year old female with a history of cardiac disease who presents with approximately 2 weeks of an infected cyst in the center of her chest between her breast. Prior to that time, there was a very small palpable mass in this area. However it became very swollen and tender. There is erythema spread underneath each breast. She was seen by her primary care physician who started her on antibiotics. This area apparently spontaneously ruptured and began draining some purulent fluid. There is significant improvement in the sensation of pain. She was then referred to a surgeon in [place] but apparently never was scheduled for an appointment. Therefore she is brought to our office for urgent evaluation. Currently this area is nontender and is no longer draining. Her cardiologist is Dr. Kathi Ludwig in [place].   Other Problems (Shelly Eversole, LPN; D34-534 624THL PM) Arthritis Back Pain Gastroesophageal Reflux Disease High blood pressure Hypercholesterolemia Myocardial infarction  Past Surgical History (Shelly Eversole, LPN; D34-534 624THL PM) Oral Surgery  Diagnostic Studies History (Shelly Eversole, LPN; D34-534 624THL PM) Colonoscopy within last year Mammogram within last year Pap Smear never  Allergies (Shelly Eversole, LPN; D34-534 [id number] PM) Aspirin EC Lo-Dose *ANALGESICS - NonNarcotic* Codeine Sulfate *ANALGESICS - OPIOID*  Medication History (Shelly Eversole, LPN; D34-534 D34-534 PM) Aspirin (81MG  Tablet Chewable, Oral) Active. Lipitor (40MG  Tablet, Oral) Active. B Complex (Oral) Active. Lisinopril (40MG  Tablet, Oral) Active. Metoprolol Succinate ER (25MG  Tablet ER 24HR, Oral) Active. Protonix (40MG  Packet, Oral)  Active. Medications Reconciled  Social History (Shelly Eversole, LPN; D34-534 624THL PM) Caffeine use Carbonated beverages, Tea. No alcohol use No drug use Tobacco use Never smoker.  Family History (Shelly Eversole, LPN; D34-534 624THL PM) Alcohol Abuse Brother. Breast Cancer Sister. Cancer Daughter. Cervical Cancer Daughter. Depression Daughter. Migraine Headache Daughter. Ovarian Cancer Daughter. Seizure disorder Daughter.  Pregnancy / Birth History Shelly Borer, LPN; D34-534 624THL PM) Age of menopause 83-50 Gravida 42 Maternal age 55-20 Para 88     Review of Systems (Shelly Eversole LPN; D34-534 624THL PM) General Present- Appetite Loss and Night Sweats. Not Present- Chills, Fatigue, Fever, Weight Gain and Weight Loss. Skin Not Present- Change in Wart/Mole, Dryness, Hives, Jaundice, New Lesions, Non-Healing Wounds, Rash and Ulcer. HEENT Not Present- Earache, Hearing Loss, Hoarseness, Nose Bleed, Oral Ulcers, Ringing in the Ears, Seasonal Allergies, Sinus Pain, Sore Throat, Visual Disturbances, Wears glasses/contact lenses and Yellow Eyes. Respiratory Present- Snoring. Not Present- Bloody sputum, Chronic Cough, Difficulty Breathing and Wheezing. Breast Not Present- Breast Mass, Breast Pain, Nipple Discharge and Skin Changes. Cardiovascular Present- Leg Cramps and Swelling of Extremities. Not Present- Chest Pain, Difficulty Breathing Lying Down, Palpitations, Rapid Heart Rate and Shortness of Breath. Gastrointestinal Present- Change in Bowel Habits. Not Present- Abdominal Pain, Bloating, Bloody Stool, Chronic diarrhea, Constipation, Difficulty Swallowing, Excessive gas, Gets full quickly at meals, Hemorrhoids, Indigestion, Nausea, Rectal Pain and Vomiting. Female Genitourinary Not Present- Frequency, Nocturia, Painful Urination, Pelvic Pain and Urgency. Musculoskeletal Present- Joint Pain, Joint Stiffness and Swelling of Extremities. Not Present- Back Pain, Muscle  Pain and Muscle Weakness. Neurological Present- Trouble walking. Not Present- Decreased Memory, Fainting, Headaches, Numbness, Seizures, Tingling, Tremor and Weakness. Psychiatric Not Present- Anxiety, Bipolar, Change in Sleep Pattern, Depression, Fearful and Frequent crying. Endocrine Present- Hot flashes. Not Present- Cold Intolerance,  Excessive Hunger, Hair Changes, Heat Intolerance and New Diabetes. Hematology Not Present- Easy Bruising, Excessive bleeding, Gland problems, HIV and Persistent Infections.  Vitals (Shelly Eversole LPN; D34-534 [id number] PM) 06/27/2015 2:41 PM Weight: 230 lb Height: 60in Body Surface Area: 1.98 m Body Mass Index: 44.92 kg/m  Temp.: 98.87F(Oral)  Pulse: 84 (Regular)  BP: 144/82 (Sitting, Left Arm, Standard)      Physical Exam Rodman Key K. Barney Gertsch MD; 06/27/2015 5:26 PM)  The physical exam findings are as follows: Note:WDWN in [place] of chest - 2 cm area of purplish discoloration with underlying 3 cm cyst - no drainage; minimally tender; no erythema or induration    Assessment & Plan Rodman Key K. Aramis Zobel MD; 06/27/2015 3:03 PM)  INFECTED SEBACEOUS CYST (L72.3)  Current Plans Schedule for Surgery - Excision of sebaceous cyst - chest. The surgical procedure has been discussed with the patient. Potential risks, benefits, alternative treatments, and expected outcomes have been explained. All of the patient's questions at this time have been answered. The likelihood of reaching the patient's treatment goal is good. The patient understand the proposed surgical procedure and wishes to proceed.  Shelly [name]. Shelly Dover, MD, [facility]  General/ Trauma Surgery  06/27/2015 5:26 PM

## 2015-07-12 ENCOUNTER — Other Ambulatory Visit: Payer: Self-pay | Admitting: Cardiovascular Disease

## 2015-07-24 ENCOUNTER — Encounter: Payer: Self-pay | Admitting: Cardiovascular Disease

## 2015-07-24 ENCOUNTER — Ambulatory Visit (INDEPENDENT_AMBULATORY_CARE_PROVIDER_SITE_OTHER): Payer: Medicare Other | Admitting: Cardiovascular Disease

## 2015-07-24 VITALS — BP 160/80 | HR 81 | Ht 63.0 in | Wt 234.0 lb

## 2015-07-24 DIAGNOSIS — E785 Hyperlipidemia, unspecified: Secondary | ICD-10-CM

## 2015-07-24 DIAGNOSIS — Z01818 Encounter for other preprocedural examination: Secondary | ICD-10-CM | POA: Diagnosis not present

## 2015-07-24 DIAGNOSIS — I2510 Atherosclerotic heart disease of native coronary artery without angina pectoris: Secondary | ICD-10-CM

## 2015-07-24 DIAGNOSIS — I10 Essential (primary) hypertension: Secondary | ICD-10-CM | POA: Diagnosis not present

## 2015-07-24 NOTE — Patient Instructions (Signed)
Continue all current medications. Your physician wants you to follow up in:  1 year.  You will receive a reminder letter in the mail one-two months in advance.  If you don't receive a letter, please call our office to schedule the follow up appointment   

## 2015-07-24 NOTE — Progress Notes (Signed)
Patient ID: JASHAE MOUSEL, female   DOB: May 16, 1941, 75 y.o.   MRN: [id number]      SUBJECTIVE: The patient returns for follow up of CAD. She also has a history of lower GI bleed and pandiverticulosis. She is being scheduled for excision of a chest wall sebaceous cyst. She took antibiotics and applied a steroid cream and she says her cyst swelling has gone down considerably. She denies exertional chest tightness. She has chronic exertional dyspnea which she attributes to not exercising. She would like to purchase a treadmill and begin walking. She avoids fried foods and salt.   Review of Systems: As per "subjective", otherwise negative.  Allergies  Allergen Reactions  . Bayer Advanced Aspirin [Aspirin] Palpitations    Patient can tolerate "low dose" aspirin  . Codeine Other (See Comments)    Makes her feel "high"    Current Outpatient Prescriptions  Medication Sig Dispense Refill  . aspirin EC 81 MG tablet Take 81 mg by mouth daily.    Marland Kitchen atorvastatin (LIPITOR) 40 MG tablet TAKE ONE TABLET BY MOUTH DAILY AT 6 PM. 90 tablet 1  . b complex vitamins capsule Take 1 capsule by mouth daily.    . diclofenac (VOLTAREN) 75 MG EC tablet Take 75 mg by mouth as needed.     Marland Kitchen lisinopril (PRINIVIL,ZESTRIL) 40 MG tablet Take 40 mg by mouth daily.  6  . metoprolol succinate (TOPROL-XL) 25 MG 24 hr tablet TAKE ONE TABLET BY MOUTH DAILY 30 tablet 6  . nitroGLYCERIN (NITROSTAT) 0.4 MG SL tablet Place 1 tablet (0.4 mg total) under the tongue every 5 (five) minutes as needed for chest pain. 25 tablet 3  . Omega-3 Fatty Acids (FISH OIL) 1000 MG CAPS Take 1,000 mg by mouth. 3 caps daily    . pantoprazole (PROTONIX) 40 MG tablet TAKE ONE TABLET BY MOUTH DAILY 30 tablet 6  . tiZANidine (ZANAFLEX) 4 MG tablet Take 4 mg by mouth every 6 (six) hours as needed.   3   No current facility-administered medications for this visit.    Past Medical History  Diagnosis Date  . Goiter   . Hypertension   .  Arthritis   . Coronary artery disease     SEVERE  . Anginal pain ([place])   . Myocardial infarction ([place]) 08/2013  . GERD (gastroesophageal reflux disease)   . GI bleed 12/2013    Past Surgical History  Procedure Laterality Date  . Corneal transplant    . Tonsillectomy    . Tubal ligation    . Heel spur excision    . Coronary angioplasty  08/2013  . Colonoscopy N/A 01/13/2014    Procedure: COLONOSCOPY;  Surgeon: Beryle Beams, MD;  Location: [facility] ENDOSCOPY;  Service: Endoscopy;  Laterality: N/A;  . Left heart catheterization with coronary angiogram N/A 08/23/2013    Procedure: LEFT HEART CATHETERIZATION WITH CORONARY ANGIOGRAM;  Surgeon: Peter M Martinique, MD;  Location: [facility] CATH LAB;  Service: Cardiovascular;  Laterality: N/A;  . Percutaneous coronary stent intervention (pci-s) N/A 08/24/2013    Procedure: PERCUTANEOUS CORONARY STENT INTERVENTION (PCI-S);  Surgeon: Peter M Martinique, MD;  Location: [facility] CATH LAB;  Service: Cardiovascular;  Laterality: N/A;    Social History   Social History  . Marital Status: Single    Spouse Name: N/A  . Number of Children: N/A  . Years of Education: N/A   Occupational History  . Not on file.   Social History Main Topics  . Smoking status: Never Smoker   .  Smokeless tobacco: Never Used  . Alcohol Use: No  . Drug Use: No  . Sexual Activity: No   Other Topics Concern  . Not on file   Social History Narrative     Filed Vitals:   07/24/15 1453  BP: 160/80  Pulse: 81  Height: 5\' 3"  (1.6 m)  Weight: 234 lb (106.142 kg)  SpO2: 98%    PHYSICAL EXAM General: NAD HEENT: Normal. Neck: No JVD, no thyromegaly. Lungs: Clear to auscultation bilaterally with normal respiratory effort. CV: Nondisplaced PMI.  Regular rate and rhythm, normal S1/S2, no S3/S4, no murmur. No pretibial or periankle edema.     Abdomen: Soft, nontender, obese. Neurologic: Alert and oriented.  Psych: Normal affect. Skin: Normal. Musculoskeletal: No gross deformities.  ECG:  Most recent ECG reviewed.      ASSESSMENT AND PLAN: 1. CAD s/p NSTEMI with overlapping stents in the proximal to mid RCA on 08/24/13: Stable ischemic heart disease. She has residual moderate CAD but denies recurrent anginal symptoms. Continue ASA, lisinopril, Lipitor, and Toprol-XL.   2. Essential hypertension: Elevated today. On lisinopril 40 mg and Toprol XL 25 mg daily. Will monitor. May need additional therapy.  3. Hyperlipidemia: Continue atorvastatin at reduced dose of 40 mg. LDL 84 on 11/03/14.  4. Preoperative risk stratification: Can proceed with planned procedure at an acceptable level of risk. No noninvasive testing is required at this time.  Dispo: f/u 1 year.  [name], M.D., F.A.C.C.

## 2015-09-01 ENCOUNTER — Encounter (HOSPITAL_COMMUNITY): Payer: Self-pay | Admitting: Emergency Medicine

## 2015-09-01 ENCOUNTER — Emergency Department (HOSPITAL_COMMUNITY): Payer: Medicare Other

## 2015-09-01 ENCOUNTER — Emergency Department (HOSPITAL_COMMUNITY)
Admission: EM | Admit: 2015-09-01 | Discharge: 2015-09-01 | Disposition: A | Discharge status: Home / Self Care | Payer: Medicare Other | Attending: Emergency Medicine | Admitting: Emergency Medicine

## 2015-09-01 DIAGNOSIS — I10 Essential (primary) hypertension: Secondary | ICD-10-CM | POA: Insufficient documentation

## 2015-09-01 DIAGNOSIS — I252 Old myocardial infarction: Secondary | ICD-10-CM | POA: Insufficient documentation

## 2015-09-01 DIAGNOSIS — I2510 Atherosclerotic heart disease of native coronary artery without angina pectoris: Secondary | ICD-10-CM | POA: Insufficient documentation

## 2015-09-01 DIAGNOSIS — M1990 Unspecified osteoarthritis, unspecified site: Secondary | ICD-10-CM | POA: Diagnosis not present

## 2015-09-01 DIAGNOSIS — H9209 Otalgia, unspecified ear: Secondary | ICD-10-CM | POA: Insufficient documentation

## 2015-09-01 DIAGNOSIS — Z7982 Long term (current) use of aspirin: Secondary | ICD-10-CM | POA: Insufficient documentation

## 2015-09-01 DIAGNOSIS — R509 Fever, unspecified: Secondary | ICD-10-CM | POA: Diagnosis present

## 2015-09-01 DIAGNOSIS — Z79899 Other long term (current) drug therapy: Secondary | ICD-10-CM | POA: Diagnosis not present

## 2015-09-01 LAB — URINALYSIS, ROUTINE W REFLEX MICROSCOPIC
Bilirubin Urine: NEGATIVE
Glucose, UA: NEGATIVE mg/dL
HGB URINE DIPSTICK: NEGATIVE
KETONES UR: NEGATIVE mg/dL
Nitrite: NEGATIVE
PROTEIN: NEGATIVE mg/dL
Specific Gravity, Urine: 1.025 (ref 1.005–1.030)
pH: 6 (ref 5.0–8.0)

## 2015-09-01 LAB — COMPREHENSIVE METABOLIC PANEL
ALT: 14 U/L (ref 14–54)
ANION GAP: 7 (ref 5–15)
AST: 17 U/L (ref 15–41)
Albumin: 3.3 g/dL — ABNORMAL LOW (ref 3.5–5.0)
Alkaline Phosphatase: 79 U/L (ref 38–126)
BILIRUBIN TOTAL: 0.5 mg/dL (ref 0.3–1.2)
BUN: 23 mg/dL — AB (ref 6–20)
CALCIUM: 9 mg/dL (ref 8.9–10.3)
CO2: 24 mmol/L (ref 22–32)
Chloride: 109 mmol/L (ref 101–111)
Creatinine, Ser: 1.26 mg/dL — ABNORMAL HIGH (ref 0.44–1.00)
GFR calc Af Amer: 47 mL/min — ABNORMAL LOW (ref >60)
GFR, EST NON AFRICAN AMERICAN: 41 mL/min — AB (ref >60)
GLUCOSE: 101 mg/dL — AB (ref 65–99)
Potassium: 4 mmol/L (ref 3.5–5.1)
Sodium: 140 mmol/L (ref 135–145)
TOTAL PROTEIN: 7.6 g/dL (ref 6.5–8.1)

## 2015-09-01 LAB — URINE MICROSCOPIC-ADD ON

## 2015-09-01 LAB — CBC WITH DIFFERENTIAL/PLATELET
Basophils Absolute: 0 10*3/uL (ref 0.0–0.1)
Basophils Relative: 0 %
EOS PCT: 0 %
Eosinophils Absolute: 0 10*3/uL (ref 0.0–0.7)
HCT: 32.6 % — ABNORMAL LOW (ref 36.0–46.0)
Hemoglobin: 10.6 g/dL — ABNORMAL LOW (ref 12.0–15.0)
Lymphocytes Relative: 45 %
Lymphs Abs: 1.5 10*3/uL (ref 0.7–4.0)
MCH: 30.9 pg (ref 26.0–34.0)
MCHC: 32.5 g/dL (ref 30.0–36.0)
MCV: 95 fL (ref 78.0–100.0)
MONO ABS: 0.5 10*3/uL (ref 0.1–1.0)
Monocytes Relative: 15 %
Neutro Abs: 1.3 10*3/uL — ABNORMAL LOW (ref 1.7–7.7)
Neutrophils Relative %: 40 %
PLATELETS: 234 10*3/uL (ref 150–400)
RBC: 3.43 MIL/uL — AB (ref 3.87–5.11)
RDW: 14 % (ref 11.5–15.5)
WBC: 3.4 10*3/uL — AB (ref 4.0–10.5)

## 2015-09-01 MED ORDER — SODIUM CHLORIDE 0.9 % IV BOLUS (SEPSIS)
1000.0000 mL | Freq: Once | INTRAVENOUS | Status: AC
  Administered 2015-09-01: 1000 mL via INTRAVENOUS

## 2015-09-01 MED ORDER — IBUPROFEN 400 MG PO TABS: 400.0000 mg | ORAL_TABLET | Freq: Four times a day (QID) | ORAL | Status: DC | PRN

## 2015-09-01 MED ORDER — CETIRIZINE HCL 10 MG PO TABS: 10.0000 mg | ORAL_TABLET | Freq: Every day | ORAL | Status: DC

## 2015-09-01 MED ORDER — LORATADINE 10 MG PO TABS
10.0000 mg | ORAL_TABLET | Freq: Once | ORAL | Status: AC
  Administered 2015-09-01: 10 mg via ORAL
  Filled 2015-09-01: qty 1

## 2015-09-01 MED ORDER — KETOROLAC TROMETHAMINE 30 MG/ML IJ SOLN
30.0000 mg | Freq: Once | INTRAMUSCULAR | Status: AC
  Administered 2015-09-01: 30 mg via INTRAVENOUS
  Filled 2015-09-01: qty 1

## 2015-09-01 NOTE — ED Notes (Signed)
PT c/o fever, headache, left ear pain, chills and non-productive cough since last night. PT denies any tylenol or ibuprofen today.

## 2015-09-01 NOTE — ED Provider Notes (Signed)
CSN: [id number]     Arrival date & time 09/01/15  1707 History   First MD Initiated Contact with Patient 09/01/15 1716     Chief Complaint  Patient presents with  . Fever     (Consider location/radiation/quality/duration/timing/severity/associated sxs/prior Treatment) Patient is a 75 y.o. female presenting with fever.  Fever Temp source:  Subjective Severity:  Mild Onset quality:  Gradual Duration:  1 day Timing:  Constant Progression:  Worsening Chronicity:  New Relieved by:  None tried Worsened by:  Nothing tried Ineffective treatments:  None tried Associated symptoms: chills, congestion, cough and ear pain   Associated symptoms: no chest pain, no diarrhea, no dysuria, no nausea, no rash, no rhinorrhea and no vomiting     Past Medical History  Diagnosis Date  . Goiter   . Hypertension   . Arthritis   . Coronary artery disease     SEVERE  . Anginal pain ([place])   . Myocardial infarction ([place]) 08/2013  . GERD (gastroesophageal reflux disease)   . GI bleed 12/2013   Past Surgical History  Procedure Laterality Date  . Corneal transplant    . Tonsillectomy    . Tubal ligation    . Heel spur excision    . Coronary angioplasty  08/2013  . Colonoscopy N/A 01/13/2014    Procedure: COLONOSCOPY;  Surgeon: Beryle Beams, MD;  Location: [facility] ENDOSCOPY;  Service: Endoscopy;  Laterality: N/A;  . Left heart catheterization with coronary angiogram N/A 08/23/2013    Procedure: LEFT HEART CATHETERIZATION WITH CORONARY ANGIOGRAM;  Surgeon: Peter M Martinique, MD;  Location: [facility] CATH LAB;  Service: Cardiovascular;  Laterality: N/A;  . Percutaneous coronary stent intervention (pci-s) N/A 08/24/2013    Procedure: PERCUTANEOUS CORONARY STENT INTERVENTION (PCI-S);  Surgeon: Peter M Martinique, MD;  Location: [facility] CATH LAB;  Service: Cardiovascular;  Laterality: N/A;   History reviewed. No pertinent family history. Social History  Substance Use Topics  . Smoking status: Never Smoker   . Smokeless tobacco:  Never Used  . Alcohol Use: No   OB History    No data available     Review of Systems  Constitutional: Positive for fever and chills.  HENT: Positive for congestion and ear pain. Negative for rhinorrhea.   Eyes: Negative for pain.  Respiratory: Positive for cough.   Cardiovascular: Negative for chest pain.  Gastrointestinal: Negative for nausea, vomiting, abdominal pain and diarrhea.  Endocrine: Negative for polydipsia and polyuria.  Genitourinary: Negative for dysuria and flank pain.  Musculoskeletal: Negative for back pain and neck pain.  Skin: Negative for rash.  All other systems reviewed and are negative.     Allergies  Bayer advanced aspirin and Codeine  Home Medications   Prior to Admission medications   Medication Sig Start Date End Date Taking? Authorizing Provider  aspirin EC 81 MG tablet Take 81 mg by mouth daily.   Yes Historical Provider, MD  atorvastatin (LIPITOR) 40 MG tablet TAKE ONE TABLET BY MOUTH DAILY AT 6 PM. 03/21/15  Yes Herminio Commons, MD  b complex vitamins capsule Take 1 capsule by mouth daily.   Yes Historical Provider, MD  diclofenac (VOLTAREN) 75 MG EC tablet Take 75 mg by mouth daily as needed for mild pain or moderate pain.    Yes Historical Provider, MD  lisinopril (PRINIVIL,ZESTRIL) 40 MG tablet Take 40 mg by mouth daily. 10/10/14  Yes Historical Provider, MD  metoprolol succinate (TOPROL-XL) 25 MG 24 hr tablet TAKE ONE TABLET BY MOUTH DAILY 07/12/15  Yes Herminio Commons, MD  Omega-3 Fatty Acids (FISH OIL) 1000 MG CAPS Take 1,000 mg by mouth. 3 caps daily   Yes Historical Provider, MD  pantoprazole (PROTONIX) 40 MG tablet TAKE ONE TABLET BY MOUTH DAILY 02/19/15  Yes Herminio Commons, MD  nitroGLYCERIN (NITROSTAT) 0.4 MG SL tablet Place 1 tablet (0.4 mg total) under the tongue every 5 (five) minutes as needed for chest pain. 04/16/15   Herminio Commons, MD   BP 136/46 mmHg  Pulse 75  Temp(Src) 99.1 F (37.3 C) (Oral)  Resp 18  Ht  5\' 6"  (1.676 m)  Wt 227 lb (102.967 kg)  BMI 36.66 kg/m2  SpO2 100% Physical Exam  Constitutional: She is oriented to person, place, and time. She appears well-developed and well-nourished.  HENT:  Head: Normocephalic and atraumatic.  Neck: Normal range of motion.  Cardiovascular: Normal rate and regular rhythm.   Pulmonary/Chest: Effort normal. No stridor. No respiratory distress. She has no wheezes. She has no rales.  Abdominal: Soft. She exhibits no distension. There is no tenderness.  Musculoskeletal: Normal range of motion. She exhibits no edema or tenderness.  Neurological: She is alert and oriented to person, place, and time. No cranial nerve deficit. Coordination normal.  Skin: Skin is warm and dry.  Nursing note and vitals reviewed.   ED Course  Procedures (including critical care time) Labs Review Labs Reviewed  CBC WITH DIFFERENTIAL/PLATELET  COMPREHENSIVE METABOLIC PANEL  URINALYSIS, ROUTINE W REFLEX MICROSCOPIC (NOT AT [facility])    Imaging Review Dg Chest 2 View  09/01/2015  CLINICAL DATA:  75 year old female with nonproductive cough for the past 2 days. EXAM: CHEST  2 VIEW COMPARISON:  Chest x-ray 08/22/2013. FINDINGS: Mild diffuse peribronchial cuffing. Lung volumes are normal. No consolidative airspace disease. No pleural effusions. No pneumothorax. No pulmonary nodule or mass noted. Pulmonary vasculature and the cardiomediastinal silhouette are within normal limits. IMPRESSION: 1. Mild diffuse peribronchial cuffing suggestive of mild acute bronchitis. Electronically Signed   By: Vinnie Langton M.D.   On: 09/01/2015 17:36   I have personally reviewed and evaluated these images and lab results as part of my medical decision-making.   EKG Interpretation   Date/Time:  Saturday September 01 2015 18:11:23 EDT Ventricular Rate:  73 PR Interval:  146 QRS Duration: 95 QT Interval:  366 QTC Calculation: 403 R Axis:   38 Text Interpretation:  Sinus rhythm Abnormal R-wave  progression, early  transition Confirmed by [facility] MD, Corene Cornea [phone]) on 09/01/2015 6:39:44 PM      MDM   Final diagnoses:  Fever, unspecified fever cause   Ear pain, chills, cough and congestion for a day. Exam benign with small amount of bilateral middle ear effusions. Vitals WNL.  Possibly early viral syndrome. No obvious bacterial pathology right now. Will eval with cbc/cmp/cxr/ua/ecg.   Labs and imaging ok. E/O bronchitis only. Patient symptoms improved with fluids adn toradol. Doubt SBI at this time.  New Prescriptions: Discharge Medication List as of 09/01/2015  7:57 PM    START taking these medications   Details  cetirizine (ZYRTEC) 10 MG tablet Take 1 tablet (10 mg total) by mouth daily., Starting 09/01/2015, Until Discontinued, Print    ibuprofen (ADVIL,MOTRIN) 400 MG tablet Take 1 tablet (400 mg total) by mouth every 6 (six) hours as needed for headache or moderate pain., Starting 09/01/2015, Until Discontinued, Print        I have personally and contemperaneously reviewed labs and imaging and used in my decision making as  above.   A medical screening exam was performed and I feel the patient has had an appropriate workup for their chief complaint at this time and likelihood of emergent condition existing is low. Their vital signs are stable. They have been counseled on decision, discharge, follow up and which symptoms necessitate immediate return to the emergency department.  They verbally stated understanding and agreement with plan and discharged in stable condition.     Merrily Pew, MD 09/02/15 [phone]

## 2015-10-10 ENCOUNTER — Other Ambulatory Visit: Payer: Self-pay | Admitting: Cardiovascular Disease

## 2015-12-09 ENCOUNTER — Inpatient Hospital Stay (HOSPITAL_COMMUNITY)
Admission: EM | Admit: 2015-12-09 | Discharge: 2015-12-16 | DRG: 378 | Disposition: A | Discharge status: Home / Self Care | Payer: Medicare Other | Attending: Internal Medicine | Admitting: Internal Medicine

## 2015-12-09 ENCOUNTER — Inpatient Hospital Stay (HOSPITAL_COMMUNITY): Payer: Medicare Other

## 2015-12-09 ENCOUNTER — Encounter (HOSPITAL_COMMUNITY): Payer: Self-pay | Admitting: Emergency Medicine

## 2015-12-09 DIAGNOSIS — K5730 Diverticulosis of large intestine without perforation or abscess without bleeding: Secondary | ICD-10-CM | POA: Diagnosis not present

## 2015-12-09 DIAGNOSIS — Z79899 Other long term (current) drug therapy: Secondary | ICD-10-CM

## 2015-12-09 DIAGNOSIS — E785 Hyperlipidemia, unspecified: Secondary | ICD-10-CM | POA: Diagnosis present

## 2015-12-09 DIAGNOSIS — Z885 Allergy status to narcotic agent status: Secondary | ICD-10-CM

## 2015-12-09 DIAGNOSIS — G8929 Other chronic pain: Secondary | ICD-10-CM | POA: Diagnosis present

## 2015-12-09 DIAGNOSIS — I10 Essential (primary) hypertension: Secondary | ICD-10-CM | POA: Diagnosis present

## 2015-12-09 DIAGNOSIS — D649 Anemia, unspecified: Secondary | ICD-10-CM

## 2015-12-09 DIAGNOSIS — K219 Gastro-esophageal reflux disease without esophagitis: Secondary | ICD-10-CM | POA: Diagnosis present

## 2015-12-09 DIAGNOSIS — I442 Atrioventricular block, complete: Secondary | ICD-10-CM | POA: Diagnosis not present

## 2015-12-09 DIAGNOSIS — I25119 Atherosclerotic heart disease of native coronary artery with unspecified angina pectoris: Secondary | ICD-10-CM | POA: Diagnosis present

## 2015-12-09 DIAGNOSIS — K922 Gastrointestinal hemorrhage, unspecified: Secondary | ICD-10-CM | POA: Diagnosis present

## 2015-12-09 DIAGNOSIS — I252 Old myocardial infarction: Secondary | ICD-10-CM

## 2015-12-09 DIAGNOSIS — M545 Low back pain: Secondary | ICD-10-CM | POA: Diagnosis present

## 2015-12-09 DIAGNOSIS — R55 Syncope and collapse: Secondary | ICD-10-CM | POA: Diagnosis not present

## 2015-12-09 DIAGNOSIS — R001 Bradycardia, unspecified: Secondary | ICD-10-CM | POA: Diagnosis present

## 2015-12-09 DIAGNOSIS — I959 Hypotension, unspecified: Secondary | ICD-10-CM | POA: Diagnosis present

## 2015-12-09 DIAGNOSIS — K625 Hemorrhage of anus and rectum: Secondary | ICD-10-CM | POA: Diagnosis not present

## 2015-12-09 DIAGNOSIS — I2510 Atherosclerotic heart disease of native coronary artery without angina pectoris: Secondary | ICD-10-CM | POA: Diagnosis present

## 2015-12-09 DIAGNOSIS — D62 Acute posthemorrhagic anemia: Secondary | ICD-10-CM | POA: Diagnosis present

## 2015-12-09 DIAGNOSIS — Z7982 Long term (current) use of aspirin: Secondary | ICD-10-CM

## 2015-12-09 DIAGNOSIS — K5731 Diverticulosis of large intestine without perforation or abscess with bleeding: Principal | ICD-10-CM | POA: Diagnosis present

## 2015-12-09 DIAGNOSIS — Z8249 Family history of ischemic heart disease and other diseases of the circulatory system: Secondary | ICD-10-CM

## 2015-12-09 DIAGNOSIS — K640 First degree hemorrhoids: Secondary | ICD-10-CM | POA: Diagnosis present

## 2015-12-09 DIAGNOSIS — Z886 Allergy status to analgesic agent status: Secondary | ICD-10-CM

## 2015-12-09 DIAGNOSIS — Z955 Presence of coronary angioplasty implant and graft: Secondary | ICD-10-CM

## 2015-12-09 DIAGNOSIS — R739 Hyperglycemia, unspecified: Secondary | ICD-10-CM | POA: Diagnosis present

## 2015-12-09 HISTORY — DX: Diverticulosis of large intestine without perforation or abscess without bleeding: K57.30

## 2015-12-09 LAB — CBC WITH DIFFERENTIAL/PLATELET
BASOS PCT: 0 %
Basophils Absolute: 0 10*3/uL (ref 0.0–0.1)
EOS ABS: 0.1 10*3/uL (ref 0.0–0.7)
Eosinophils Relative: 1 %
HCT: 30 % — ABNORMAL LOW (ref 36.0–46.0)
HEMOGLOBIN: 9.7 g/dL — AB (ref 12.0–15.0)
Lymphocytes Relative: 30 %
Lymphs Abs: 2 10*3/uL (ref 0.7–4.0)
MCH: 31.2 pg (ref 26.0–34.0)
MCHC: 32.3 g/dL (ref 30.0–36.0)
MCV: 96.5 fL (ref 78.0–100.0)
Monocytes Absolute: 0.4 10*3/uL (ref 0.1–1.0)
Monocytes Relative: 7 %
NEUTROS ABS: 4.1 10*3/uL (ref 1.7–7.7)
NEUTROS PCT: 62 %
Platelets: 255 10*3/uL (ref 150–400)
RBC: 3.11 MIL/uL — AB (ref 3.87–5.11)
RDW: 13.8 % (ref 11.5–15.5)
WBC: 6.5 10*3/uL (ref 4.0–10.5)

## 2015-12-09 LAB — COMPREHENSIVE METABOLIC PANEL
ALBUMIN: 3 g/dL — AB (ref 3.5–5.0)
ALK PHOS: 68 U/L (ref 38–126)
ALT: 12 U/L — AB (ref 14–54)
AST: 19 U/L (ref 15–41)
Anion gap: 4 — ABNORMAL LOW (ref 5–15)
BUN: 25 mg/dL — AB (ref 6–20)
CALCIUM: 8.6 mg/dL — AB (ref 8.9–10.3)
CO2: 21 mmol/L — AB (ref 22–32)
CREATININE: 1.2 mg/dL — AB (ref 0.44–1.00)
Chloride: 114 mmol/L — ABNORMAL HIGH (ref 101–111)
GFR calc Af Amer: 50 mL/min — ABNORMAL LOW (ref >60)
GFR calc non Af Amer: 43 mL/min — ABNORMAL LOW (ref >60)
GLUCOSE: 150 mg/dL — AB (ref 65–99)
Potassium: 4.1 mmol/L (ref 3.5–5.1)
SODIUM: 139 mmol/L (ref 135–145)
Total Bilirubin: 0.5 mg/dL (ref 0.3–1.2)
Total Protein: 6.6 g/dL (ref 6.5–8.1)

## 2015-12-09 LAB — PROTIME-INR
INR: 1.08 (ref 0.00–1.49)
Prothrombin Time: 14.2 seconds (ref 11.6–15.2)

## 2015-12-09 LAB — CBC
HCT: 26.4 % — ABNORMAL LOW (ref 36.0–46.0)
HEMATOCRIT: 27.9 % — AB (ref 36.0–46.0)
HEMOGLOBIN: 8.9 g/dL — AB (ref 12.0–15.0)
Hemoglobin: 8.4 g/dL — ABNORMAL LOW (ref 12.0–15.0)
MCH: 30.8 pg (ref 26.0–34.0)
MCH: 30.2 pg (ref 26.0–34.0)
MCHC: 31.8 g/dL (ref 30.0–36.0)
MCHC: 31.9 g/dL (ref 30.0–36.0)
MCV: 96.7 fL (ref 78.0–100.0)
MCV: 94.6 fL (ref 78.0–100.0)
PLATELETS: 246 10*3/uL (ref 150–400)
PLATELETS: 215 10*3/uL (ref 150–400)
RBC: 2.73 MIL/uL — AB (ref 3.87–5.11)
RBC: 2.95 MIL/uL — ABNORMAL LOW (ref 3.87–5.11)
RDW: 13.7 % (ref 11.5–15.5)
RDW: 14.6 % (ref 11.5–15.5)
WBC: 6 10*3/uL (ref 4.0–10.5)
WBC: 8.1 10*3/uL (ref 4.0–10.5)

## 2015-12-09 LAB — PREPARE RBC (CROSSMATCH)

## 2015-12-09 LAB — ABO/RH: ABO/RH(D): O POS

## 2015-12-09 LAB — TSH: TSH: 0.324 u[IU]/mL — AB (ref 0.350–4.500)

## 2015-12-09 LAB — TROPONIN I

## 2015-12-09 LAB — APTT: aPTT: 25 seconds (ref 24–37)

## 2015-12-09 LAB — POC OCCULT BLOOD, ED: Fecal Occult Bld: POSITIVE — AB

## 2015-12-09 MED ORDER — PANTOPRAZOLE SODIUM 40 MG IV SOLR
40.0000 mg | INTRAVENOUS | Status: DC
  Administered 2015-12-10 – 2015-12-12 (×3): 40 mg via INTRAVENOUS
  Filled 2015-12-09 (×3): qty 40

## 2015-12-09 MED ORDER — ONDANSETRON HCL 4 MG/2ML IJ SOLN
4.0000 mg | Freq: Once | INTRAMUSCULAR | Status: AC
Start: 2015-12-09 — End: 2015-12-09
  Administered 2015-12-09: 4 mg via INTRAVENOUS

## 2015-12-09 MED ORDER — METOPROLOL SUCCINATE ER 25 MG PO TB24: 25.0000 mg | ORAL_TABLET | Freq: Every day | ORAL | Status: DC

## 2015-12-09 MED ORDER — SODIUM CHLORIDE 0.9 % IV SOLN
INTRAVENOUS | Status: DC
  Administered 2015-12-09 (×2): via INTRAVENOUS

## 2015-12-09 MED ORDER — ACETAMINOPHEN 650 MG RE SUPP: 650.0000 mg | Freq: Four times a day (QID) | RECTAL | Status: DC | PRN

## 2015-12-09 MED ORDER — NITROGLYCERIN 0.4 MG SL SUBL: 0.4000 mg | SUBLINGUAL_TABLET | SUBLINGUAL | Status: DC | PRN

## 2015-12-09 MED ORDER — ATORVASTATIN CALCIUM 40 MG PO TABS
40.0000 mg | ORAL_TABLET | Freq: Every day | ORAL | Status: DC
  Administered 2015-12-09 – 2015-12-15 (×7): 40 mg via ORAL
  Filled 2015-12-09 (×7): qty 1

## 2015-12-09 MED ORDER — ALBUTEROL SULFATE (2.5 MG/3ML) 0.083% IN NEBU: 2.5000 mg | INHALATION_SOLUTION | RESPIRATORY_TRACT | Status: DC | PRN

## 2015-12-09 MED ORDER — ONDANSETRON HCL 4 MG/2ML IJ SOLN
4.0000 mg | Freq: Four times a day (QID) | INTRAMUSCULAR | Status: DC | PRN
  Administered 2015-12-12 – 2015-12-14 (×2): 4 mg via INTRAVENOUS
  Filled 2015-12-09: qty 2

## 2015-12-09 MED ORDER — SODIUM CHLORIDE 0.9 % IV SOLN: INTRAVENOUS | Status: DC

## 2015-12-09 MED ORDER — SODIUM CHLORIDE 0.9 % IV SOLN: Freq: Once | INTRAVENOUS | Status: DC

## 2015-12-09 MED ORDER — SODIUM CHLORIDE 0.9% FLUSH
3.0000 mL | Freq: Two times a day (BID) | INTRAVENOUS | Status: DC
  Administered 2015-12-09 – 2015-12-14 (×9): 3 mL via INTRAVENOUS

## 2015-12-09 MED ORDER — PANTOPRAZOLE SODIUM 40 MG IV SOLR
40.0000 mg | Freq: Two times a day (BID) | INTRAVENOUS | Status: AC
  Administered 2015-12-09 (×2): 40 mg via INTRAVENOUS
  Filled 2015-12-09 (×2): qty 40

## 2015-12-09 MED ORDER — ONDANSETRON HCL 4 MG/2ML IJ SOLN
INTRAMUSCULAR | Status: AC
  Filled 2015-12-09: qty 2

## 2015-12-09 MED ORDER — OMEGA-3-ACID ETHYL ESTERS 1 G PO CAPS
2.0000 g | ORAL_CAPSULE | Freq: Every day | ORAL | Status: DC
  Administered 2015-12-10 – 2015-12-13 (×4): 2 g via ORAL
  Filled 2015-12-09 (×6): qty 2

## 2015-12-09 MED ORDER — ALPRAZOLAM 0.5 MG PO TABS
0.5000 mg | ORAL_TABLET | Freq: Once | ORAL | Status: AC
  Administered 2015-12-09: 0.5 mg via ORAL
  Filled 2015-12-09: qty 1

## 2015-12-09 MED ORDER — ACETAMINOPHEN 325 MG PO TABS: 650.0000 mg | ORAL_TABLET | Freq: Four times a day (QID) | ORAL | Status: DC | PRN

## 2015-12-09 MED ORDER — ONDANSETRON HCL 4 MG PO TABS: 4.0000 mg | ORAL_TABLET | Freq: Four times a day (QID) | ORAL | Status: DC | PRN

## 2015-12-09 NOTE — Progress Notes (Signed)
MD is in room with patient. Pt is now alert and oriented and able to answer LOC questions. Will continue to monitor patient throughout the shift.

## 2015-12-09 NOTE — Progress Notes (Addendum)
Pt had a brief loss of level of consciousness. Informed by central telemetry that patient experienced Heart Block 3rd degree. EKG stat was ordered and performed. MD notified. Pt's vital signs are as follows   12/09/15 1502  Vitals  Temp 98.6 F (37 C)  Temp Source Oral  BP (!) 144/80  BP Location Left Arm  BP Method Automatic  Patient Position (if appropriate) Lying  Pulse Rate (!) 108  Pulse Rate Source Dinamap  Resp 18  Oxygen Therapy  SpO2 99 %  O2 Device Room Air

## 2015-12-09 NOTE — ED Triage Notes (Signed)
Pt with a history of GI bleed - Dr Brigitte Pulse is her physician

## 2015-12-09 NOTE — H&P (Addendum)
History and Physical    Shelly [name] [id number] DOB: December 20, 1940 DOA: 12/09/2015  PCP: [name], MD   Cardiologist [name]; previous GI Dr. Benson Norway Patient coming from: Home  Chief Complaint: Rectal bleeding and subsequent passing out at home.  HPI: Shelly [name] is a 75 y.o. female with medical history significant for rectal bleeding secondary to pan diverticulosis bleed in August 2015, CAD status post non-ST elevation MI with overlapping stents 08/2013, and hypertension, who presents with large volume rectal bleeding. The patient was in her usual state of health when she got up this morning at approximately 3:00 to use the bathroom. She had a bowel movement with blood mixed in with the stool, a moderate amount. She felt a little "funny" but was able to get back to bed. She got up again at approximately 4 to 4:30 AM. She then proceeded to have a large volume of bright red blood, but no stool. The next thing she knew, she was on the floor after losing consciousness while sitting on the toilet. She hit her forehead on the left side, which resulted in some swelling, but no bleeding. Her family heard the fall and found her on the floor unresponsive, but she became responsive with provocation. When EMS arrived, she was found to be hypotensive. She was given IV fluids.  ED Course: In the ED, she was afebrile and hemodynamically stable with a blood pressure in the [id number] systolically. Her lab data were significant for hemoglobin of 9.7, BUN of 25, creatinine of 1.20, glucose of 150, INR 1.08, and PTT of 25. She was admitted for further evaluation and management.  Review of Systems: As per HPI and chronic left-sided chest pain; otherwise 10 point review of systems negative.    Past Medical History:  Diagnosis Date  . Anginal pain ([place])   . Arthritis   . Coronary artery disease    SEVERE  . GERD (gastroesophageal reflux disease)   . GI bleed 12/2013  . Goiter   . Hypertension   .  Myocardial infarction [facility]) 08/2013    Past Surgical History:  Procedure Laterality Date  . COLONOSCOPY N/A 01/13/2014   Procedure: COLONOSCOPY;  Surgeon: Beryle Beams, MD;  Location: [place];  Service: Endoscopy;  Laterality: N/A;  . CORNEAL TRANSPLANT    . CORONARY ANGIOPLASTY  08/2013  . HEEL SPUR EXCISION    . LEFT HEART CATHETERIZATION WITH CORONARY ANGIOGRAM N/A 08/23/2013   Procedure: LEFT HEART CATHETERIZATION WITH CORONARY ANGIOGRAM;  Surgeon: Peter M Martinique, MD;  Location: [facility] CATH LAB;  Service: Cardiovascular;  Laterality: N/A;  . PERCUTANEOUS CORONARY STENT INTERVENTION (PCI-S) N/A 08/24/2013   Procedure: PERCUTANEOUS CORONARY STENT INTERVENTION (PCI-S);  Surgeon: Peter M Martinique, MD;  Location: [facility] CATH LAB;  Service: Cardiovascular;  Laterality: N/A;  . TONSILLECTOMY    . TUBAL LIGATION      Social history. She is widowed. She lives in [place]. Her son, daughter, and grandchildren live with her. 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 She reports that she has never smoked. She has never used smokeless tobacco. She reports that she does not drink alcohol or use drugs.  Allergies  Allergen Reactions  . Bayer Advanced Aspirin [Aspirin] Palpitations    Patient can tolerate "low dose" aspirin  . Codeine Other (See Comments)    Makes her feel "high"    Family history: Her mother died of cancer. Her father died of gangrene.   Prior to Admission medications  Medication Sig Start Date End Date Taking? Authorizing Provider  aspirin EC 81 MG tablet Take 81 mg by mouth daily.   Yes Historical Provider, MD  atorvastatin (LIPITOR) 40 MG tablet TAKE ONE TABLET BY MOUTH DAILY AT 6 PM. 03/21/15  Yes Herminio Commons, MD  b complex vitamins capsule Take 1 capsule by mouth daily.   Yes Historical Provider, MD  diclofenac (VOLTAREN) 75 MG EC tablet Take 75 mg by mouth daily as needed for mild pain or moderate pain.    Yes Historical Provider, MD  ibuprofen  (ADVIL,MOTRIN) 400 MG tablet Take 1 tablet (400 mg total) by mouth every 6 (six) hours as needed for headache or moderate pain. 09/01/15  Yes Merrily Pew, MD  lisinopril (PRINIVIL,ZESTRIL) 40 MG tablet Take 40 mg by mouth daily. 10/10/14  Yes Historical Provider, MD  metoprolol succinate (TOPROL-XL) 25 MG 24 hr tablet TAKE ONE TABLET BY MOUTH DAILY 07/12/15  Yes Herminio Commons, MD  nitroGLYCERIN (NITROSTAT) 0.4 MG SL tablet Place 1 tablet (0.4 mg total) under the tongue every 5 (five) minutes as needed for chest pain. 04/16/15  Yes Herminio Commons, MD  Omega-3 Fatty Acids (FISH OIL) 1000 MG CAPS Take 3,000 mg by mouth daily.    Yes Historical Provider, MD  pantoprazole (PROTONIX) 40 MG tablet TAKE ONE TABLET BY MOUTH DAILY 10/11/15  Yes Herminio Commons, MD    Physical Exam: Vitals:   12/09/15 0930 12/09/15 1000 12/09/15 1102 12/09/15 1203  BP: 116/62 106/59 (!) 120/50 (!) 108/52  Pulse: 66 69 72 67  Resp: 14 18 18 18   Temp:   98.6 F (37 C) 98.3 F (36.8 C)  TempSrc:   Oral Oral  SpO2: 100% 100% 100% 100%  Weight:   105.5 kg (232 lb 9.6 oz)   Height:   5\' 5"  (1.651 m)       Constitutional: NAD, calm, comfortable Vitals:   12/09/15 0930 12/09/15 1000 12/09/15 1102 12/09/15 1203  BP: 116/62 106/59 (!) 120/50 (!) 108/52  Pulse: 66 69 72 67  Resp: 14 18 18 18   Temp:   98.6 F (37 C) 98.3 F (36.8 C)  TempSrc:   Oral Oral  SpO2: 100% 100% 100% 100%  Weight:   105.5 kg (232 lb 9.6 oz)   Height:   5\' 5"  (1.651 m)    Eyes: PERRL, lids and conjunctivae normal; small nodule above her left eye; nonbleeding. ENMT: Mucous membranes are mildly dry. Posterior pharynx clear of any exudate or lesions.Normal dentition.  Neck: normal, supple, no masses, no thyromegaly Respiratory: clear to auscultation bilaterally, no wheezing, no crackles. Normal respiratory effort. No accessory muscle use.  Cardiovascular: Regular rate and rhythm, no murmurs / rubs / gallops. Trace of pedal edema  2+ pedal pulses. No carotid bruits.  Abdomen: no tenderness, no masses palpated. No hepatosplenomegaly. Bowel sounds positive.  Musculoskeletal: no clubbing / cyanosis. No joint deformity upper and lower extremities. Good ROM, no contractures. Normal muscle tone.  Rectal per EDP revealed red blood on the examiner's glove. Skin: Small nodule/bruise above the left eye, not bleeding, mildly tender. Neurologic: CN 2-12 grossly intact. Sensation intact, DTR normal. Strength 5/5 in all 4.  Psychiatric: Normal judgment and insight. Alert and oriented x 3. Normal mood.    Labs on Admission: I have personally reviewed following labs and imaging studies  CBC:  Recent Labs Lab 12/09/15 0638  WBC 6.5  NEUTROABS 4.1  HGB 9.7*  HCT 30.0*  MCV 96.5  PLT [id number]   Basic Metabolic Panel:  Recent Labs Lab 12/09/15 0638  NA 139  K 4.1  CL 114*  CO2 21*  GLUCOSE 150*  BUN 25*  CREATININE 1.20*  CALCIUM 8.6*   GFR: Estimated Creatinine Clearance: 49.6 mL/min (by C-G formula based on SCr of 1.2 mg/dL). Liver Function Tests:  Recent Labs Lab 12/09/15 0638  AST 19  ALT 12*  ALKPHOS 68  BILITOT 0.5  PROT 6.6  ALBUMIN 3.0*   No results for input(s): LIPASE, AMYLASE in the last 168 hours. No results for input(s): AMMONIA in the last 168 hours. Coagulation Profile:  Recent Labs Lab 12/09/15 0638  INR 1.08   Cardiac Enzymes: No results for input(s): CKTOTAL, CKMB, CKMBINDEX, TROPONINI in the last 168 hours. BNP (last 3 results) No results for input(s): PROBNP in the last 8760 hours. HbA1C: No results for input(s): HGBA1C in the last 72 hours. CBG: No results for input(s): GLUCAP in the last 168 hours. Lipid Profile: No results for input(s): CHOL, HDL, LDLCALC, TRIG, CHOLHDL, LDLDIRECT in the last 72 hours. Thyroid Function Tests: No results for input(s): TSH, T4TOTAL, FREET4, T3FREE, THYROIDAB in the last 72 hours. Anemia Panel: No results for input(s): VITAMINB12, FOLATE,  FERRITIN, TIBC, IRON, RETICCTPCT in the last 72 hours. Urine analysis:    Component Value Date/Time   COLORURINE YELLOW 09/01/2015 1918   APPEARANCEUR CLEAR 09/01/2015 1918   LABSPEC 1.025 09/01/2015 1918   PHURINE 6.0 09/01/2015 1918   GLUCOSEU NEGATIVE 09/01/2015 1918   HGBUR NEGATIVE 09/01/2015 1918   BILIRUBINUR NEGATIVE 09/01/2015 1918   KETONESUR NEGATIVE 09/01/2015 1918   PROTEINUR NEGATIVE 09/01/2015 1918   UROBILINOGEN 0.2 08/01/2014 0003   NITRITE NEGATIVE 09/01/2015 1918   LEUKOCYTESUR TRACE (A) 09/01/2015 1918   Sepsis Labs: !!!!!!!!!!!!!!!!!!!!!!!!!!!!!!!!!!!!!!!!!!!! @LABRCNTIP (procalcitonin:4,lacticidven:4) )No results found for this or any previous visit (from the past 240 hour(s)).   Radiological Exams on Admission: No results found.  EKG: Independently reviewed.   Assessment/Plan Principal Problem:   Lower GI bleeding Active Problems:   Rectal bleed   Syncope and collapse   Acute blood loss anemia   Essential hypertension   CAD (coronary artery disease)    1. Lower GI bleeding/rectal bleeding. The patient has a history of pan diverticulosis which was felt to be the etiology of her lower GI bleeding 12/2013. She reports no rectal bleeding since that time. Her stool was guaiac positive and grossly red per the EDP. The likely etiology of her recurrent rectal bleeding is recurrent diverticular bleed. -The patient will be started on Protonix empirically. A clear liquid diet will be started. Gastroenterology will be consulted.  Acute blood loss anemia. She has a history of chronic anemia, with a baseline hemoglobin of 10.5-11.0. It was 9.7 on admission. I anticipate that her hemoglobin will decrease with equilibration. If it decreases more than 1 g, I would favor having her packed red blood cell transfusion in the setting of hypotension and syncope on admission.   Syncope with collapse. The patient had a syncopal episode while having a bowel movement which  resulted in mostly blood. The syncope was likely vasovagal and from volume depletion. Neurologically, she is intact with no worrisome findings. -We'll order further IV fluid hydration with normal saline. -We'll order TSH and vitamin B12 level.  Coronary artery disease with a history of stenting. Patient has a history of non-ST elevation MI with overlapping stents in the proximal to mid RCA in April 2015. She is treated chronically with aspirin, lisinopril, Lipitor,  and Toprol-XL. She is followed by cardiologist, [name]. -We'll hold lisinopril and Toprol-XL due to soft blood pressures and volume depletion. Will hold aspirin due to GI bleeding. We'll continue her statin. -We will order cardiac enzyme 1 and if positive, we'll continue to cycle.  Mild hyperglycemia. Patient has no history of diabetes. Will order hemoglobin A1c.  DVT prophylaxis: SCDs Code Status: Full code Family Communication: Discussed with daughter and granddaughters Disposition Plan: Discharge to home with clinically appropriate, likely in a few days. Consults called: Gastroenterology, Dr. Laural Golden Admission status: Inpatient telemetry   [facility] MD [facility] Pager [phone]  If 7PM-7AM, please contact night-coverage [email or web address] Password [email or web address]  12/09/2015, 1:42 PM

## 2015-12-09 NOTE — ED Notes (Addendum)
Dr. Eulis Foster at bedside, rectal exam performed with nurse as witness.

## 2015-12-09 NOTE — Progress Notes (Signed)
Pt had loose stool that was burgundy/dark red. GI doctor was notified. Will continue to monitor patient throughout the shift.

## 2015-12-09 NOTE — ED Provider Notes (Signed)
[facility] DEPT Provider Note   CSN: [id number] Arrival date & time: 12/09/15  [id number]  First Provider Contact:  First MD Initiated Contact with Patient 12/09/15 0715        History   Chief Complaint Chief Complaint  Patient presents with  . GI Bleeding     HPI Shelly [name] is a 75 y.o. female. She presents for evaluation of rectal bleeding. Rectal bleeding first noticed about 3:30 AM. She was in the bathroom, and after passing bloody stool, she stood up, felt dizzy and passed out. Her granddaughter heard her collapse, went to her side and she was unconscious. The granddaughter called another relative to help the patient up. They then called EMS. EMS apparently arrived and found her to be hypotensive. They changed her with IV fluids with some improvement, during transportation. Arrival in the emergency department. The patient denies pain, but states that she still feels "funny". Prior history of GI bleeding by your year ago evaluated with colonoscopy, by her report, without significant findings. No other prodrome. No other recent illnesses. She has not been having any fever, chills, cough, shortness of breath, chest pain, focal weakness or paresthesia. There are no other known modifying factors.  HPI  Past Medical History:  Diagnosis Date  . Anginal pain ([place])   . Arthritis   . Coronary artery disease    SEVERE  . GERD (gastroesophageal reflux disease)   . GI bleed 12/2013  . Goiter   . Hypertension   . Myocardial infarction ([place]) 08/2013  . Pancolonic diverticulosis 12/2013    Patient Active Problem List   Diagnosis Date Noted  . Third degree heart block ([place]) 12/10/2015  . Rectal bleed 12/09/2015  . Lower GI bleeding 12/09/2015  . Syncope and collapse 12/09/2015  . Acute blood loss anemia 12/09/2015  . Pancolonic diverticulosis 12/09/2015  . GI bleeding 01/11/2014  . Rectal bleeding 01/11/2014  . CAD (coronary artery disease) 01/11/2014  . NSTEMI (non-ST elevated  myocardial infarction) ([place]) 08/23/2013  . Absolute anemia 08/23/2013  . Chest pain 08/22/2013  . Essential hypertension 08/22/2013  . GERD (gastroesophageal reflux disease) 08/22/2013  . Anxiety 08/22/2013    Past Surgical History:  Procedure Laterality Date  . COLONOSCOPY N/A 01/13/2014   Procedure: COLONOSCOPY;  Surgeon: Beryle Beams, MD;  Location: [facility];  Service: Endoscopy;  Laterality: N/A;  . CORNEAL TRANSPLANT    . CORONARY ANGIOPLASTY  08/2013  . HEEL SPUR EXCISION    . LEFT HEART CATHETERIZATION WITH CORONARY ANGIOGRAM N/A 08/23/2013   Procedure: LEFT HEART CATHETERIZATION WITH CORONARY ANGIOGRAM;  Surgeon: Peter M Martinique, MD;  Location[facility] C[facility] LAB;  Service: Cardiovascular;  Laterality: N/A;  . PERCUTANEOUS CORONARY STENT INTERVENTION (PCI-S) N/A 08/24/2013   Procedure: PERCUTANEOUS CORONARY STENT INTERVENTION (PCI-S);  Surgeon: Peter M Martinique, MD;  Location: [facility] CATH LAB;  Service: Cardiovascular;  Laterality: N/A;  . TONSILLECTOMY    . TUBAL LIGATION      OB History    No data available       Home Medications    Prior to Admission medications   Medication Sig Start Date End Date Taking? Authorizing Provider  aspirin EC 81 MG tablet Take 81 mg by mouth daily.   Yes Historical Provider, MD  atorvastatin (LIPITOR) 40 MG tablet TAKE ONE TABLET BY MOUTH DAILY AT 6 PM. 03/21/15  Yes Herminio Commons, MD  b complex vitamins capsule Take 1 capsule by mouth daily.   Yes Historical Provider, MD  diclofenac (  VOLTAREN) 75 MG EC tablet Take 75 mg by mouth daily as needed for mild pain or moderate pain.    Yes Historical Provider, MD  ibuprofen (ADVIL,MOTRIN) 400 MG tablet Take 1 tablet (400 mg total) by mouth every 6 (six) hours as needed for headache or moderate pain. 09/01/15  Yes Merrily Pew, MD  lisinopril (PRINIVIL,ZESTRIL) 40 MG tablet Take 40 mg by mouth daily. 10/10/14  Yes Historical Provider, MD  metoprolol succinate (TOPROL-XL) 25 MG 24 hr tablet TAKE ONE TABLET  BY MOUTH DAILY 07/12/15  Yes Herminio Commons, MD  nitroGLYCERIN (NITROSTAT) 0.4 MG SL tablet Place 1 tablet (0.4 mg total) under the tongue every 5 (five) minutes as needed for chest pain. 04/16/15  Yes Herminio Commons, MD  Omega-3 Fatty Acids (FISH OIL) 1000 MG CAPS Take 3,000 mg by mouth daily.    Yes Historical Provider, MD  pantoprazole (PROTONIX) 40 MG tablet TAKE ONE TABLET BY MOUTH DAILY 10/11/15  Yes Herminio Commons, MD    Family History Family History  Problem Relation Age of Onset  . Hypertension Brother     Social History Social History  Substance Use Topics  . Smoking status: Never Smoker  . Smokeless tobacco: Never Used  . Alcohol use No     Allergies   Bayer advanced aspirin [aspirin] and Codeine   Review of Systems Review of Systems  All other systems reviewed and are negative.    Physical Exam Updated Vital Signs BP (!) 117/53   Pulse 81   Temp 98.7 F (37.1 C) (Oral)   Resp 15   Ht 5\' 5"  (1.651 m)   Wt 236 lb 9.6 oz (107.3 kg)   SpO2 98%   BMI 39.37 kg/m   Physical Exam  Constitutional: She appears well-developed and well-nourished. No distress.  HENT:  Head: Normocephalic and atraumatic.  Eyes: Conjunctivae are normal. Right eye exhibits no discharge. Left eye exhibits no discharge. No scleral icterus.  Pale conjunctiva  Neck: Neck supple.  Cardiovascular: Normal rate and regular rhythm.   No murmur heard. Pulmonary/Chest: Effort normal and breath sounds normal. No respiratory distress.  Abdominal: Soft. She exhibits no mass. There is no tenderness. There is no guarding.  Genitourinary:  Genitourinary Comments: Anus, with external tags consistent with old hemorrhoids. Somewhat dark but red colored blood in rectum, no stool in rectum. No rectal mass.  Musculoskeletal: She exhibits no edema.  Neurological: She is alert.  Skin: Skin is warm and dry.  Psychiatric: She has a normal mood and affect.  Nursing note and vitals  reviewed.    ED Treatments / Results  Labs   Medications  omega-3 acid ethyl esters (LOVAZA) capsule 2 g (2 g Oral Given 12/10/15 1515)  nitroGLYCERIN (NITROSTAT) SL tablet 0.4 mg (not administered)  atorvastatin (LIPITOR) tablet 40 mg (40 mg Oral Given 12/10/15 1740)  sodium chloride flush (NS) 0.9 % injection 3 mL (3 mLs Intravenous Given 12/10/15 2200)  acetaminophen (TYLENOL) tablet 650 mg (not administered)    Or  acetaminophen (TYLENOL) suppository 650 mg (not administered)  ondansetron (ZOFRAN) tablet 4 mg (not administered)    Or  ondansetron (ZOFRAN) injection 4 mg (not administered)  albuterol (PROVENTIL) (2.5 MG/3ML) 0.083% nebulizer solution 2.5 mg (not administered)  0.9 %  sodium chloride infusion (not administered)  pantoprazole (PROTONIX) injection 40 mg (40 mg Intravenous Given 12/09/15 2113)    Followed by  pantoprazole (PROTONIX) injection 40 mg (40 mg Intravenous Given 12/10/15 1517)  0.9 %  sodium chloride infusion (not administered)  lisinopril (PRINIVIL,ZESTRIL) tablet 20 mg (20 mg Oral Given 12/10/15 1515)  0.9 %  sodium chloride infusion (not administered)  metoprolol succinate (TOPROL-XL) 24 hr tablet 12.5 mg (not administered)  ondansetron (ZOFRAN) injection 4 mg ( Intravenous Duplicate [id number])  ALPRAZolam (XANAX) tablet 0.5 mg (0.5 mg Oral Given 12/09/15 1611)  magnesium sulfate IVPB 2 g 50 mL (2 g Intravenous Given 12/10/15 1518)    Patient Vitals for the past 24 hrs:  BP Temp Temp src Pulse Resp SpO2 Weight  12/11/15 0650 (!) 117/53 98.7 F (37.1 C) Oral 81 15 98 % -  12/11/15 0500 - - - - - - 236 lb 9.6 oz (107.3 kg)  12/11/15 0339 (!) 123/53 98.6 F (37 C) Oral 96 20 100 % -  12/11/15 0316 129/61 98.2 F (36.8 C) Oral 92 (!) 21 100 % -  12/11/15 0240 (!) 130/50 98.6 F (37 C) Oral 82 13 98 % -  12/10/15 2340 (!) 116/56 98.1 F (36.7 C) Oral 76 20 100 % -  12/10/15 2315 (!) 134/58 98.2 F (36.8 C) Oral 79 17 98 % -    At time of admission  Reevaluation with update and discussion. After initial assessment and treatment, an updated evaluation reveals no change in status. Plan discussed/agreed. Diego Delancey L     (all labs ordered are listed, but only abnormal results are displayed) Labs Reviewed  CBC WITH DIFFERENTIAL/PLATELET - Abnormal; Notable for the following:       Result Value   RBC 3.11 (*)    Hemoglobin 9.7 (*)    HCT 30.0 (*)    All other components within normal limits  COMPREHENSIVE METABOLIC PANEL - Abnormal; Notable for the following:    Chloride 114 (*)    CO2 21 (*)    Glucose, Bld 150 (*)    BUN 25 (*)    Creatinine, Ser 1.20 (*)    Calcium 8.6 (*)    Albumin 3.0 (*)    ALT 12 (*)    GFR calc non Af Amer 43 (*)    GFR calc Af Amer 50 (*)    Anion gap 4 (*)    All other components within normal limits  CBC - Abnormal; Notable for the following:    RBC 2.73 (*)    Hemoglobin 8.4 (*)    HCT 26.4 (*)    All other components within normal limits  TSH - Abnormal; Notable for the following:    TSH 0.324 (*)    All other components within normal limits  HEMOGLOBIN A1C - Abnormal; Notable for the following:    Hgb A1c MFr Bld 6.0 (*)    All other components within normal limits  BASIC METABOLIC PANEL - Abnormal; Notable for the following:    Chloride 117 (*)    CO2 21 (*)    Glucose, Bld 121 (*)    BUN 24 (*)    Creatinine, Ser 1.20 (*)    Calcium 8.3 (*)    GFR calc non Af Amer 43 (*)    GFR calc Af Amer 50 (*)    Anion gap 3 (*)    All other components within normal limits  CBC - Abnormal; Notable for the following:    RBC 2.95 (*)    Hemoglobin 8.9 (*)    HCT 27.9 (*)    All other components within normal limits  CBC - Abnormal; Notable for the following:    RBC 2.78 (*)  Hemoglobin 8.4 (*)    HCT 26.3 (*)    All other components within normal limits  GLUCOSE, CAPILLARY - Abnormal; Notable for the following:    Glucose-Capillary 135 (*)    All other components within normal limits    HEMOGLOBIN AND HEMATOCRIT, BLOOD - Abnormal; Notable for the following:    Hemoglobin 8.1 (*)    HCT 25.7 (*)    All other components within normal limits  HEMOGLOBIN AND HEMATOCRIT, BLOOD - Abnormal; Notable for the following:    Hemoglobin 7.5 (*)    HCT 23.6 (*)    All other components within normal limits  HEMOGLOBIN AND HEMATOCRIT, BLOOD - Abnormal; Notable for the following:    Hemoglobin 7.1 (*)    HCT 22.4 (*)    All other components within normal limits  POC OCCULT BLOOD, ED - Abnormal; Notable for the following:    Fecal Occult Bld POSITIVE (*)    All other components within normal limits  MRSA PCR SCREENING  APTT  PROTIME-INR  TROPONIN I  VITAMIN B12  MAGNESIUM  TROPONIN I  TROPONIN I  BASIC METABOLIC PANEL  T4, FREE  HEMOGLOBIN AND HEMATOCRIT, BLOOD  TROPONIN I  TYPE AND SCREEN  PREPARE RBC (CROSSMATCH)  ABO/RH  PREPARE RBC (CROSSMATCH)  TYPE AND SCREEN    Hemoglobin  Date Value Ref Range Status  12/10/2015 7.1 (L) 12.0 - 15.0 g/dL Final  12/10/2015 7.5 (L) 12.0 - 15.0 g/dL Final  12/10/2015 8.1 (L) 12.0 - 15.0 g/dL Final  12/10/2015 8.4 (L) 12.0 - 15.0 g/dL Final     EKG  EKG Interpretation  Date/Time:  Sunday December 09 2015 09:28:21 EDT Ventricular Rate:  71 PR Interval:    QRS Duration: 86 QT Interval:  407 QTC Calculation: 443 R Axis:   21 Text Interpretation:  Sinus rhythm Borderline low voltage, extremity leads Abnormal R-wave progression, early transition Minimal ST elevation, anterior leads since last tracing no significant change Confirmed by Eulis Foster  MD, Serah Nicoletti [phone]) on 12/09/2015 9:46:15 AM       Radiology Ct Head Wo Contrast  Result Date: 12/09/2015 CLINICAL DATA:  75 year old female with acute syncope and collapse. EXAM: CT HEAD WITHOUT CONTRAST TECHNIQUE: Contiguous axial images were obtained from the base of the skull through the vertex without intravenous contrast. COMPARISON:  None. FINDINGS: Mild probable chronic small-vessel  white matter ischemic changes are noted. Bilateral basal ganglia calcifications noted -physiologic versus metabolic. No acute intracranial abnormalities are identified, including mass lesion or mass effect, hydrocephalus, extra-axial fluid collection, midline shift, hemorrhage, or acute infarction. The visualized bony calvarium is unremarkable. IMPRESSION: No evidence of acute intracranial abnormality. Mild probable chronic small-vessel white matter ischemic changes. Electronically Signed   By: Margarette Canada M.D.   On: 12/09/2015 17:33   Procedures Procedures (including critical care time)  Medications Ordered in ED Medications  omega-3 acid ethyl esters (LOVAZA) capsule 2 g (2 g Oral Given 12/10/15 1515)  nitroGLYCERIN (NITROSTAT) SL tablet 0.4 mg (not administered)  atorvastatin (LIPITOR) tablet 40 mg (40 mg Oral Given 12/10/15 1740)  sodium chloride flush (NS) 0.9 % injection 3 mL (3 mLs Intravenous Given 12/10/15 2200)  acetaminophen (TYLENOL) tablet 650 mg (not administered)    Or  acetaminophen (TYLENOL) suppository 650 mg (not administered)  ondansetron (ZOFRAN) tablet 4 mg (not administered)    Or  ondansetron (ZOFRAN) injection 4 mg (not administered)  albuterol (PROVENTIL) (2.5 MG/3ML) 0.083% nebulizer solution 2.5 mg (not administered)  0.9 %  sodium chloride infusion (  not administered)  pantoprazole (PROTONIX) injection 40 mg (40 mg Intravenous Given 12/09/15 2113)    Followed by  pantoprazole (PROTONIX) injection 40 mg (40 mg Intravenous Given 12/10/15 1517)  0.9 %  sodium chloride infusion (not administered)  lisinopril (PRINIVIL,ZESTRIL) tablet 20 mg (20 mg Oral Given 12/10/15 1515)  0.9 %  sodium chloride infusion (not administered)  metoprolol succinate (TOPROL-XL) 24 hr tablet 12.5 mg (not administered)  ondansetron (ZOFRAN) injection 4 mg ( Intravenous Duplicate [id number])  ALPRAZolam (XANAX) tablet 0.5 mg (0.5 mg Oral Given 12/09/15 1611)  magnesium sulfate IVPB 2 g 50 mL  (2 g Intravenous Given 12/10/15 1518)   8:35 AM-Consult complete with hospitalist. Patient case explained and discussed. She agrees to admit patient for further evaluation and treatment. Call ended at [place] / Assessment and Plan / ED Course  I have reviewed the triage vital signs and the nursing notes.  Pertinent labs & imaging results that were available during my care of the patient were reviewed by me and considered in my medical decision making (see chart for details).  Clinical Course  Value Comment By Time  Hemoglobin: (!) 9.7 low Daleen Bo, MD 07/23 [phone]  Fecal Occult Blood, POC: (!) POSITIVE abnormal Daleen Bo, MD 07/23 0823    BP (!) 117/53   Pulse 81   Temp 98.7 F (37.1 C) (Oral)   Resp 15   Ht 5\' 5"  (1.651 m)   Wt 236 lb 9.6 oz (107.3 kg)   SpO2 98%   BMI 39.37 kg/m     Final Clinical Impressions(s) / ED Diagnoses   Final diagnoses:  Rectal bleeding  Anemia, unspecified anemia type   Recurrent rectal bleed. Suspect AVM. Pt stable in ED. Requires admission  New Prescriptions Current Discharge Medication List       Daleen Bo, MD 12/11/15 [phone]

## 2015-12-09 NOTE — Consult Note (Signed)
Referring Provider: Rexene Alberts, MD Primary Care Physician:  [name], MD Primary Gastroenterologist:  Dr. Laural Golden  Reason for Consultation:    Rectal bleeding.  HPI:   Patient is 75 year old African-American female who was in usual state of felt until she woke up around 3:20 AM this morning in order to avoid. While doing so she had a bowel movement. She passed small amount of stool but large amount of bright red blood per rectum. She did not feel dizzy or lightheaded and went back to bed. She woke up a few minutes later diaphoretic and as she try to stand up she almost fell. She mated to the bathroom and had a bloody bowel movement and passed out. Her granddaughter came to rescue and 911 was called and patient was brought to emergency room. While in emergency room she was noted to have hemoglobin of 9.7. She did not have any more bowel movements. She did pass out in bed a few minutes earlier. Her daughter states she is been very upset as she is been thinking of her daughter who died at age 54. She did experience nausea when she had bloody bowel movement. She did not have any vomiting chest pain or shortness of breath. Her bowels move every couple of days. She states she is prone to constipation and uses Linzess on when necessary. She has good appetite and has not lost any weight. She states that she had a heart attack she lost 40 pounds and she has given 30 back. She states heartburn is well controlled with therapy. Patient states she had similar episode in August 2015 when she was admitted to [facility]. She had 2 units of PRBCs. She had colonoscopy revealing pancolonic diverticulosis but no active bleeding identified. This was felt to be the source of her GI blood loss. Patient wasn't clopidogrel and was switched to low-dose aspirin. Patient has chronic low back pain as well as knee pain and takes diclofenac on as-needed basis but not daily. She does not take OTC Advil. 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 Prior to that she worked at [employer] and [employer] All in all she's worked for 50 years. She is divorced. She had 8 children. Her daughter died at age 68. She died in sleep. Rest of the children are in good health. She does not smoke cigarettes or drink alcohol. Mother died of unknown cancer at a young age. Father died of complications due to leg gangrene. She has one brother and one sister living. 5 brothers and 2 sisters are disease. One brother was diabetic one sister had bronchial last, and another one died of CVA in her 66s.    Past Medical History:  Diagnosis Date  . Anginal pain ([place])   . Arthritis   . Coronary artery disease    SEVERE  . GERD (gastroesophageal reflux disease)   . GI bleed 12/2013  . Goiter   . Hypertension   . Myocardial infarction ([place]) 08/2013  . Pancolonic diverticulosis 12/2013    Past Surgical History:  Procedure Laterality Date  . COLONOSCOPY N/A 01/13/2014   Procedure: COLONOSCOPY;  Surgeon: Beryle Beams, MD;  Location: [place];  Service: Endoscopy;  Laterality: N/A;  . CORNEAL TRANSPLANT    . CORONARY ANGIOPLASTY  08/2013  . HEEL SPUR EXCISION    . LEFT HEART CATHETERIZATION WITH CORONARY ANGIOGRAM N/A 08/23/2013   Procedure: LEFT HEART CATHETERIZATION WITH CORONARY ANGIOGRAM;  Surgeon: Peter M Martinique, MD;  Location:  [facility] CATH LAB;  Service: Cardiovascular;  Laterality: N/A;  . PERCUTANEOUS CORONARY STENT INTERVENTION (PCI-S) N/A 08/24/2013   Procedure: PERCUTANEOUS CORONARY STENT INTERVENTION (PCI-S);  Surgeon: Peter M Martinique, MD;  Location[facility] C[facility] LAB;  Service: Cardiovascular;  Laterality: N/A;  . TONSILLECTOMY    . TUBAL LIGATION      Prior to Admission medications   Medication Sig Start Date End Date Taking? Authorizing Provider  aspirin EC 81 MG tablet Take 81 mg by mouth daily.   Yes Historical Provider, MD  atorvastatin (LIPITOR) 40 MG tablet TAKE ONE TABLET BY MOUTH DAILY AT 6 PM. 03/21/15  Yes Herminio Commons, MD  b complex vitamins capsule Take 1 capsule by mouth daily.   Yes Historical Provider, MD  diclofenac (VOLTAREN) 75 MG EC tablet Take 75 mg by mouth daily as needed for mild pain or moderate pain.    Yes Historical Provider, MD  ibuprofen (ADVIL,MOTRIN) 400 MG tablet Take 1 tablet (400 mg total) by mouth every 6 (six) hours as needed for headache or moderate pain. 09/01/15  Yes Merrily Pew, MD  lisinopril (PRINIVIL,ZESTRIL) 40 MG tablet Take 40 mg by mouth daily. 10/10/14  Yes Historical Provider, MD  metoprolol succinate (TOPROL-XL) 25 MG 24 hr tablet TAKE ONE TABLET BY MOUTH DAILY 07/12/15  Yes Herminio Commons, MD  nitroGLYCERIN (NITROSTAT) 0.4 MG SL tablet Place 1 tablet (0.4 mg total) under the tongue every 5 (five) minutes as needed for chest pain. 04/16/15  Yes Herminio Commons, MD  Omega-3 Fatty Acids (FISH OIL) 1000 MG CAPS Take 3,000 mg by mouth daily.    Yes Historical Provider, MD  pantoprazole (PROTONIX) 40 MG tablet TAKE ONE TABLET BY MOUTH DAILY 10/11/15  Yes Herminio Commons, MD    Current Facility-Administered Medications  Medication Dose Route Frequency Provider Last Rate Last Dose  . 0.9 %  sodium chloride infusion   Intravenous Continuous Rexene Alberts, MD 100 mL/hr at 12/09/15 1438    . 0.9 %  sodium chloride infusion   Intravenous Once Rexene Alberts, MD      . 0.9 %  sodium chloride infusion   Intravenous Once Rexene Alberts, MD      . acetaminophen (TYLENOL) tablet 650 mg  650 mg Oral Q6H PRN Rexene Alberts, MD       Or  . acetaminophen (TYLENOL) suppository 650 mg  650 mg Rectal Q6H PRN Rexene Alberts, MD      . albuterol (PROVENTIL) (2.5 MG/3ML) 0.083% nebulizer solution 2.5 mg  2.5 mg Nebulization Q2H PRN Rexene Alberts, MD      . atorvastatin (LIPITOR) tablet 40 mg  40 mg Oral q1800 Rexene Alberts, MD      . Derrill Memo ON 12/10/2015] metoprolol succinate (TOPROL-XL) 24 hr tablet 25 mg  25 mg Oral Daily Rexene Alberts, MD      . nitroGLYCERIN (NITROSTAT) SL  tablet 0.4 mg  0.4 mg Sublingual Q5 min PRN Rexene Alberts, MD      . Derrill Memo ON 12/10/2015] omega-3 acid ethyl esters (LOVAZA) capsule 2 g  2 g Oral Daily Rexene Alberts, MD      . ondansetron (ZOFRAN) tablet 4 mg  4 mg Oral Q6H PRN Rexene Alberts, MD       Or  . ondansetron (ZOFRAN) injection 4 mg  4 mg Intravenous Q6H PRN Rexene Alberts, MD      . pantoprazole (PROTONIX) injection 40 mg  40 mg Intravenous Q12H Rexene Alberts, MD   40 mg at  12/09/15 1435   Followed by  . [START ON 12/10/2015] pantoprazole (PROTONIX) injection 40 mg  40 mg Intravenous Q24H Rexene Alberts, MD      . sodium chloride flush (NS) 0.9 % injection 3 mL  3 mL Intravenous Q12H Rexene Alberts, MD   3 mL at 12/09/15 1435    Allergies as of 12/09/2015 - Review Complete 12/09/2015  Allergen Reaction Noted  . Bayer advanced aspirin [aspirin] Palpitations 08/22/2013  . Codeine Other (See Comments)     History reviewed. No pertinent family history.  Social History   Social History  . Marital status: Single    Spouse name: N/A  . Number of children: N/A  . Years of education: N/A   Occupational History  . Not on file.   Social History Main Topics  . Smoking status: Never Smoker  . Smokeless tobacco: Never Used  . Alcohol use No  . Drug use: No  . Sexual activity: No   Other Topics Concern  . Not on file   Social History Narrative  . No narrative on file    Review of Systems: See HPI, otherwise normal ROS  Physical Exam: Temp:  [98.1 F (36.7 C)-98.6 F (37 C)] 98.6 F (37 C) (07/23 1502) Pulse Rate:  [64-108] 108 (07/23 1502) Resp:  [10-18] 18 (07/23 1502) BP: (106-144)/(50-80) 144/80 (07/23 1502) SpO2:  [99 %-100 %] 99 % (07/23 1502) Weight:  [224 lb (101.6 kg)-232 lb 9.6 oz (105.5 kg)] 232 lb 9.6 oz (105.5 kg) (07/23 1102)  Patient is alert and in no acute distress. Conjunctiva is pale. Sclerae nonicteric. Oropharyngeal mucosa is normal. Patient is edentulous. No neck masses or thyromegaly  noted. Cardiac exam with regular rhythm normal S1 and S2. No murmur or gallop noted. Lungs are clear to auscultation. Abdomen is full. Bowel sounds are normal. On palpation abdomen is soft and nontender without organomegaly or masses. No peripheral edema or clubbing noted.     Lab Results:  Recent Labs  12/09/15 0638 12/09/15 1355  WBC 6.5 6.0  HGB 9.7* 8.4*  HCT 30.0* 26.4*  PLT 255 246   BMET  Recent Labs  12/09/15 0638  NA 139  K 4.1  CL 114*  CO2 21*  GLUCOSE 150*  BUN 25*  CREATININE 1.20*  CALCIUM 8.6*   LFT  Recent Labs  12/09/15 0638  PROT 6.6  ALBUMIN 3.0*  AST 19  ALT 12*  ALKPHOS 68  BILITOT 0.5   PT/INR  Recent Labs  12/09/15 0638  LABPROT 14.2  INR 1.08    Assessment;  Patient is 75 year old African-American female who presents with large-volume painless hematochezia associated with orthostatic/hypotensive syncope at home. She has history of 2 unit diverticular bleed back in August 2015 when she was on clopidogrel. She is presently low-dose aspirin and uses prophylactic on as-needed basis. Patient's hemoglobin has decreased from 9.7 on admission to 8.4 this afternoon. She is to receive a unit of PRBCs today. Suspect we are dealing with colonic diverticular bleed. I do not believe urgent colonoscopy would be beneficial with active bleeding as the yield would be rather low. If she continues to bleed would consider GI bleeding scan and angiography and once she stops bleeding or slows down will consider diagnostic colonoscopy. In the meantime will continue supportive therapy and blood transfusion as appropriate. Patient had another episode of syncope while in bed. Her daughter stated that she is very upset as she is thinking of her daughter who died at age 92.  She may have had vasovagal syncope.  Anemia secondary to lower GI bleed. Hemoglobin on 09/01/2015 was 10.6. I therefore wonder if she has anemia of chronic  disease.  Recommendations;  Supportive therapy and PRBC as planned. Patient advised not to take diclofenac or similar medications in future. Diagnostic colonoscopy possibly tomorrow.   LOS: 0 days   Najeeb Rehman  12/09/2015, 4:36 PM

## 2015-12-09 NOTE — ED Notes (Signed)
Pt in by EMS after being on toilet and reporting a rectal bleed with large amount of blood in the toilet. When she stood up, the had a syncopal episode and family called EMS.  EMS reports a PRESSURE AT THE HOME OF 88/50 AND PT WAS DIZZY. Pt to stretcher, IV established with labs drawn.

## 2015-12-10 ENCOUNTER — Encounter (HOSPITAL_COMMUNITY): Payer: Self-pay | Admitting: Internal Medicine

## 2015-12-10 ENCOUNTER — Inpatient Hospital Stay (HOSPITAL_COMMUNITY): Payer: Medicare Other

## 2015-12-10 DIAGNOSIS — I442 Atrioventricular block, complete: Secondary | ICD-10-CM | POA: Diagnosis not present

## 2015-12-10 DIAGNOSIS — K5730 Diverticulosis of large intestine without perforation or abscess without bleeding: Secondary | ICD-10-CM | POA: Diagnosis not present

## 2015-12-10 DIAGNOSIS — K922 Gastrointestinal hemorrhage, unspecified: Secondary | ICD-10-CM | POA: Diagnosis not present

## 2015-12-10 DIAGNOSIS — I441 Atrioventricular block, second degree: Secondary | ICD-10-CM

## 2015-12-10 DIAGNOSIS — I2510 Atherosclerotic heart disease of native coronary artery without angina pectoris: Secondary | ICD-10-CM

## 2015-12-10 DIAGNOSIS — I10 Essential (primary) hypertension: Secondary | ICD-10-CM | POA: Diagnosis not present

## 2015-12-10 DIAGNOSIS — D62 Acute posthemorrhagic anemia: Secondary | ICD-10-CM

## 2015-12-10 DIAGNOSIS — R55 Syncope and collapse: Secondary | ICD-10-CM

## 2015-12-10 DIAGNOSIS — K625 Hemorrhage of anus and rectum: Secondary | ICD-10-CM | POA: Diagnosis not present

## 2015-12-10 DIAGNOSIS — D649 Anemia, unspecified: Secondary | ICD-10-CM

## 2015-12-10 LAB — HEMOGLOBIN AND HEMATOCRIT, BLOOD
HCT: 25.7 % — ABNORMAL LOW (ref 36.0–46.0)
HCT: 22.4 % — ABNORMAL LOW (ref 36.0–46.0)
HEMATOCRIT: 23.6 % — AB (ref 36.0–46.0)
HEMOGLOBIN: 7.5 g/dL — AB (ref 12.0–15.0)
Hemoglobin: 8.1 g/dL — ABNORMAL LOW (ref 12.0–15.0)
Hemoglobin: 7.1 g/dL — ABNORMAL LOW (ref 12.0–15.0)

## 2015-12-10 LAB — HEMOGLOBIN A1C
HEMOGLOBIN A1C: 6 % — AB (ref 4.8–5.6)
MEAN PLASMA GLUCOSE: 126 mg/dL

## 2015-12-10 LAB — CBC
HCT: 26.3 % — ABNORMAL LOW (ref 36.0–46.0)
Hemoglobin: 8.4 g/dL — ABNORMAL LOW (ref 12.0–15.0)
MCH: 30.2 pg (ref 26.0–34.0)
MCHC: 31.9 g/dL (ref 30.0–36.0)
MCV: 94.6 fL (ref 78.0–100.0)
PLATELETS: 221 10*3/uL (ref 150–400)
RBC: 2.78 MIL/uL — ABNORMAL LOW (ref 3.87–5.11)
RDW: 14.9 % (ref 11.5–15.5)
WBC: 8.7 10*3/uL (ref 4.0–10.5)

## 2015-12-10 LAB — BASIC METABOLIC PANEL
ANION GAP: 3 — AB (ref 5–15)
BUN: 24 mg/dL — ABNORMAL HIGH (ref 6–20)
CALCIUM: 8.3 mg/dL — AB (ref 8.9–10.3)
CO2: 21 mmol/L — AB (ref 22–32)
CREATININE: 1.2 mg/dL — AB (ref 0.44–1.00)
Chloride: 117 mmol/L — ABNORMAL HIGH (ref 101–111)
GFR, EST AFRICAN AMERICAN: 50 mL/min — AB (ref >60)
GFR, EST NON AFRICAN AMERICAN: 43 mL/min — AB (ref >60)
GLUCOSE: 121 mg/dL — AB (ref 65–99)
Potassium: 4.4 mmol/L (ref 3.5–5.1)
Sodium: 141 mmol/L (ref 135–145)

## 2015-12-10 LAB — VITAMIN B12: Vitamin B-12: 448 pg/mL (ref 180–914)

## 2015-12-10 LAB — ECHOCARDIOGRAM COMPLETE
HEIGHTINCHES: 65 in
WEIGHTICAEL: 3721.6 [oz_av]

## 2015-12-10 LAB — GLUCOSE, CAPILLARY: GLUCOSE-CAPILLARY: 135 mg/dL — AB (ref 65–99)

## 2015-12-10 LAB — TROPONIN I: Troponin I: <0.03 ng/mL (ref <0.03)

## 2015-12-10 LAB — MRSA PCR SCREENING: MRSA by PCR: NEGATIVE

## 2015-12-10 LAB — MAGNESIUM: Magnesium: 1.8 mg/dL (ref 1.7–2.4)

## 2015-12-10 LAB — PREPARE RBC (CROSSMATCH)

## 2015-12-10 MED ORDER — SODIUM CHLORIDE 0.9 % IV SOLN: Freq: Once | INTRAVENOUS | Status: DC

## 2015-12-10 MED ORDER — MAGNESIUM SULFATE 50 % IJ SOLN
2.0000 g | Freq: Once | INTRAMUSCULAR | Status: DC
  Filled 2015-12-10: qty 4

## 2015-12-10 MED ORDER — MAGNESIUM SULFATE 2 GM/50ML IV SOLN
2.0000 g | Freq: Once | INTRAVENOUS | Status: AC
  Administered 2015-12-10: 2 g via INTRAVENOUS
  Filled 2015-12-10: qty 50

## 2015-12-10 MED ORDER — LISINOPRIL 20 MG PO TABS
20.0000 mg | ORAL_TABLET | Freq: Every day | ORAL | Status: DC
  Administered 2015-12-10 – 2015-12-16 (×7): 20 mg via ORAL
  Filled 2015-12-10 (×7): qty 1

## 2015-12-10 NOTE — Progress Notes (Signed)
PROGRESS NOTE    Shelly [name]  [id number] DOB: [birth year] DOA: 12/09/2015 PCP: [name], MD  Cardiologist [name]; previous GI Dr. Benson Norway  Brief Narrative:  Patient is a 75 year old woman with a history of rectal bleeding secondary to pandiverticulosis/diverticular bleed in August 2015, CAD status post non-ST elevation MI with overlapping stents 08/2013, and HTN, who was admitted on 12/09/2015 for large volume rectal bleeding and syncopal episode. In the ED, she was afebrile and hemodynamically stable with a blood pressure in the [id number] systolically. Her lab data were significant for hemoglobin of 9.7, BUN of 25, creatinine of 1.20, glucose of 150, INR 1.08, and PTT of 25. She was admitted for further workup. Her hemoglobin decreased from 9.7-8.4. She was given 1 unit of packed red blood cell transfusion with appropriate increase in her hemoglobin. Early this morning, she had another large volume bloody stool and syncopal episode. Apparently her to telemetry strip showed a third-degree heart block. Dr. Marin Comment discussed the patient with cardiology at [facility] who recommended transfer to SDU for further evaluation. -Cardiology will need to be notified when the patient arrives at [facility]. -If the patient continues to have on point bleeding, would consult gastroenterology-she has seen Dr. Benson Norway in the past. Consider further transfusion as appropriate.   Assessment & Plan:   Principal Problem:   Lower GI bleeding Active Problems:   Rectal bleed   Syncope and collapse   Acute blood loss anemia   Essential hypertension   CAD (coronary artery disease)   Pancolonic diverticulosis   Third degree heart block ([place])     1. Lower GI bleeding/rectal bleeding. The patient has a history of pan diverticulosis which was felt to be the etiology of her lower GI bleeding 12/2013. She reported no rectal bleeding since that time. Her stool was guaiac positive and grossly red per the EDP. The likely etiology of  her recurrent rectal bleeding is recurrent diverticular bleed. -The patient will be started on Protonix empirically. A clear liquid diet will be started. Gastroenterology was consulted. Dr. Dorien Chihuahua recommended conservative treatment and avoidance of NSAIDs. Diagnostic colonoscopy was considered. The patient was scoped by Dr. Benson Norway in 2015, would recommend consult in him if the patient continues to have ongoing bleeding.  Acute blood loss anemia. She has a history of chronic anemia, with a baseline hemoglobin of 10.5-11.0. It was 9.7 on admission. She was started on gentle IV fluids. Her hemoglobin fell to 8.4. In light of the large volume bleeding and history of CAD, she was transfused 1 unit of packed red blood cells. Her hemoglobin improved appropriately. It is currently stable at 8.4. -We'll cycle CBCs every 6 hours. If her hemoglobin continues to drop to 8 or below, which transfuse another unit of packed red blood cells.   Syncope with collapse. The patient had a syncopal episode while having a bloody bowel movement at home. She had another brief syncopal episode after becoming anxious in the hospital yesterday. She was started on gentle IV fluids. CT scan of her head revealed no acute disease. TSH was marginally low. Vitamin B12 is pending. -While having another bowel movement early this morning, she had another syncopal episode. Apparently, the telemetry revealed third-degree AV block which was confirmed by Dr. Marin Comment. -Etiology was thought to be secondary to vasovagal response from rectal bleeding, but heart block is certainly a consideration. She is neurologically intact.  Third-degree AV block. As noted above. Follow-up EKG last night revealed normal sinus rhythm with a heart rate of  75 bpm. -Patient will be transferred to [facility] for cardiology assessment. Per exam, she is in normal sinus rhythm.  Coronary artery disease with a history of stenting. Patient has a history of non-ST elevation MI  with overlapping stents in the proximal to mid RCA in April 2015. She is treated chronically with aspirin, lisinopril, Lipitor, and Toprol-XL. She is followed by cardiologist, [name]. Lisinopril and Toprol-XL were held on admission due to soft blood pressures. Aspirin was held due to GI bleeding. She was continued on her statin. Her troponin I was negative 1. -We'll restart lisinopril-at a lower dose, but will hold Toprol-XL in light of the recent heart block.   Mild hyperglycemia. Patient has no history of diabetes. Her hemoglobin A1c was 6.0.  Mildly low TSH. Patient TSH is 0.324. Will order free T4.   DVT prophylaxis: SCDs Code Status: Full code Family Communication: Discussed with daughter and granddaughter Disposition Plan: Transferred to [facility] for workup of third-degree heart block.   Consultants:   Cardiology-will need to be consulted at [facility].  Consider GI consult .  Procedures:   None  Antimicrobials:   None   Subjective: Events noted early this morning for another syncopal episode after patient had a bloody bowel movement. Telemetry strip was reported showing a third-degree AV block. Currently, patient has no complaints of chest pain, shortness of breath, or dizziness.  Objective: Vitals:   12/09/15 2010 12/10/15 0230 12/10/15 0450 12/10/15 0620  BP: (!) 137/52 (!) 115/46 (!) 150/81 140/80  Pulse: 66 94 81 79  Resp: 16 20 20 20   Temp: 98.5 F (36.9 C) 99.5 F (37.5 C) 99.1 F (37.3 C) 99.2 F (37.3 C)  TempSrc: Oral Oral Oral Oral  SpO2: 100% 100% 98% 98%  Weight:      Height:        Intake/Output Summary (Last 24 hours) at 12/10/15 0805 Last data filed at 12/09/15 2332  Gross per 24 hour  Intake              695 ml  Output              150 ml  Net              545 ml   Filed Weights   12/09/15 0629 12/09/15 1102  Weight: 101.6 kg (224 lb) 105.5 kg (232 lb 9.6 oz)    Examination:  General exam: Seems comfortable, alert, and  oriented. Respiratory system: Clear to auscultation. Respiratory effort normal. Cardiovascular system: S1, S2, with a soft systolic murmur.. Trace of pedal edema. Gastrointestinal system: Abdomen is nondistended, soft and nontender. No organomegaly or masses felt. Normal bowel sounds heard. Central nervous system: Alert and oriented. No focal neurological deficits. Extremities: Symmetric 5 x 5 power. Skin: No rashes, lesions or ulcers Psychiatry: Judgement and insight appear normal. Mood & affect appropriate.     Data Reviewed: I have personally reviewed following labs and imaging studies  CBC:  Recent Labs Lab 12/09/15 0638 12/09/15 1355 12/09/15 2316 12/10/15 0417  WBC 6.5 6.0 8.1 8.7  NEUTROABS 4.1  --   --   --   HGB 9.7* 8.4* 8.9* 8.4*  HCT 30.0* 26.4* 27.9* 26.3*  MCV 96.5 96.7 94.6 94.6  PLT 255 246 215 [id number]   Basic Metabolic Panel:  Recent Labs Lab 12/09/15 0638 12/10/15 0417  NA 139 141  K 4.1 4.4  CL 114* 117*  CO2 21* 21*  GLUCOSE 150* 121*  BUN 25* 24*  CREATININE  1.20* 1.20*  CALCIUM 8.6* 8.3*   GFR: Estimated Creatinine Clearance: 49.6 mL/min (by C-G formula based on SCr of 1.2 mg/dL). Liver Function Tests:  Recent Labs Lab 12/09/15 0638  AST 19  ALT 12*  ALKPHOS 68  BILITOT 0.5  PROT 6.6  ALBUMIN 3.0*   No results for input(s): LIPASE, AMYLASE in the last 168 hours. No results for input(s): AMMONIA in the last 168 hours. Coagulation Profile:  Recent Labs Lab 12/09/15 0638  INR 1.08   Cardiac Enzymes:  Recent Labs Lab 12/09/15 1333  TROPONINI <0.03   BNP (last 3 results) No results for input(s): PROBNP in the last 8760 hours. HbA1C:  Recent Labs  12/09/15 1333  HGBA1C 6.0*   CBG:  Recent Labs Lab 12/10/15 0444  GLUCAP 135*   Lipid Profile: No results for input(s): CHOL, HDL, LDLCALC, TRIG, CHOLHDL, LDLDIRECT in the last 72 hours. Thyroid Function Tests:  Recent Labs  12/09/15 0833  TSH 0.324*   Anemia  Panel: No results for input(s): VITAMINB12, FOLATE, FERRITIN, TIBC, IRON, RETICCTPCT in the last 72 hours. Sepsis Labs: No results for input(s): PROCALCITON, LATICACIDVEN in the last 168 hours.  No results found for this or any previous visit (from the past 240 hour(s)).       Radiology Studies: Ct Head Wo Contrast  Result Date: 12/09/2015 CLINICAL DATA:  75 year old female with acute syncope and collapse. EXAM: CT HEAD WITHOUT CONTRAST TECHNIQUE: Contiguous axial images were obtained from the base of the skull through the vertex without intravenous contrast. COMPARISON:  None. FINDINGS: Mild probable chronic small-vessel white matter ischemic changes are noted. Bilateral basal ganglia calcifications noted -physiologic versus metabolic. No acute intracranial abnormalities are identified, including mass lesion or mass effect, hydrocephalus, extra-axial fluid collection, midline shift, hemorrhage, or acute infarction. The visualized bony calvarium is unremarkable. IMPRESSION: No evidence of acute intracranial abnormality. Mild probable chronic small-vessel white matter ischemic changes. Electronically Signed   By: Margarette Canada M.D.   On: 12/09/2015 17:33       Scheduled Meds: . sodium chloride   Intravenous Once  . sodium chloride   Intravenous Once  . atorvastatin  40 mg Oral q1800  . omega-3 acid ethyl esters  2 g Oral Daily  . pantoprazole (PROTONIX) IV  40 mg Intravenous Q24H  . sodium chloride flush  3 mL Intravenous Q12H   Continuous Infusions: . sodium chloride 70 mL/hr at 12/09/15 2114     LOS: 1 day    Time spent: 53 minutes    Rexene Alberts, MD [facility] Pager [phone]  If 7PM-7AM, please contact night-coverage [email or web address] Password [email or web address] 12/10/2015, 8:05 AM

## 2015-12-10 NOTE — Progress Notes (Signed)
Report called to Sharee Pimple, RN on 3W via phone.

## 2015-12-10 NOTE — Progress Notes (Signed)
Dr. Laural Golden notified of CBC results post transfusion via phone.  No new orders given at this time.

## 2015-12-10 NOTE — Progress Notes (Addendum)
CTSP re syncope. Patient had syncopes felt to be due to vasovagal. She was admitted for lower GI Bleed as well. She had LOC while on bedside commode. BP now 150/70 HR 70. CBG 135.  Hb 8.4 g per dL and Stable.  Reviewed of rhythm strip at 04:40 am showed 3 degree HB, with HR around 40's. She is now in NSR and stable.  External pacer will be placed.   Speak with cardiology Dr Rosanna Randy, and will transfer her to [facility] now for further eval for ppm. Will send to SDU, Dr Lara Mulch is accepting Hospitalist.  Thanks,  Orvan Falconer MD FACP.  Hospitalist.

## 2015-12-10 NOTE — Progress Notes (Signed)
Transferred to [facility] in stable condition via carelink.

## 2015-12-10 NOTE — Progress Notes (Signed)
Paged Tylene Fantasia NP because patient had a very dark bowel movement prior to shift change and last hemoglobin is 7.5. Orders received for stat H&H to be drawn.

## 2015-12-10 NOTE — Consult Note (Addendum)
CARDIOLOGY CONSULT NOTE   Patient ID: Shelly [name] MRN: [id number], DOB/AGE: April 11, 1941   Admit date: 12/09/2015 Date of Consult: 12/10/2015   Primary Physician: [name], MD Primary Cardiologist: [name]  Pt. Profile  Shelly [name] is a pleasant 75 yo AA female with PMH of CAD s/p overlapping stent to prox and mid RCA in 08/2013, HTN, h/o goiter, and pancolonic diverticulosis presented with GI bleeding, while sitting on the toilet, she had an episode of syncope. While at [facility], she had 2nd syncope with high degree AV block after finishing bowel movement.  Problem List  Past Medical History:  Diagnosis Date  . Anginal pain ([place])   . Arthritis   . Coronary artery disease    SEVERE  . GERD (gastroesophageal reflux disease)   . GI bleed 12/2013  . Goiter   . Hypertension   . Myocardial infarction ([place]) 08/2013  . Pancolonic diverticulosis 12/2013    Past Surgical History:  Procedure Laterality Date  . COLONOSCOPY N/A 01/13/2014   Procedure: COLONOSCOPY;  Surgeon: Beryle Beams, MD;  Location: [place];  Service: Endoscopy;  Laterality: N/A;  . CORNEAL TRANSPLANT    . CORONARY ANGIOPLASTY  08/2013  . HEEL SPUR EXCISION    . LEFT HEART CATHETERIZATION WITH CORONARY ANGIOGRAM N/A 08/23/2013   Procedure: LEFT HEART CATHETERIZATION WITH CORONARY ANGIOGRAM;  Surgeon: Peter M Martinique, MD;  Location: [facility] CATH LAB;  Service: Cardiovascular;  Laterality: N/A;  . PERCUTANEOUS CORONARY STENT INTERVENTION (PCI-S) N/A 08/24/2013   Procedure: PERCUTANEOUS CORONARY STENT INTERVENTION (PCI-S);  Surgeon: Peter M Martinique, MD;  Location: [facility] CATH LAB;  Service: Cardiovascular;  Laterality: N/A;  . TONSILLECTOMY    . TUBAL LIGATION       Allergies  Allergies  Allergen Reactions  . Bayer Advanced Aspirin [Aspirin] Palpitations    Patient can tolerate "low dose" aspirin  . Codeine Other (See Comments)    Makes her feel "high"    HPI   Shelly [name] is a pleasant 75 yo AA  female with PMH of CAD s/p overlapping stent to prox and mid RCA in 08/2013, HTN, h/o goiter, and pancolonic diverticulosis. She denies any PMH of syncope prior to yesterday. She says that she was diagnosed with diverticulosis requiring blood transfusion 2 years ago. She did complete a course of Plavix, this was eventually discontinued. She says her originally anginal symptom in 2015 was diaphoresis, shortness of breath, and nausea. She did not have any chest pain at the time. Since then, she has been taking metoprolol at home.  She was essentially in her usual state of health until she started having bloody stool yesterday around 3 AM. She eventually went back to bed. She woke up again around 4:30 AM yesterday morning. While sitting on the toilet having significant bloody bowel movement with bright red blood per rectum, she subsequently passed out. She says prior to passing out, she felt swammy headed. She did hit her left forehead. When EMS arrived, she was hypotensive with systolic blood pressure in the 80s. She was eventually sent to [facility] for workup of GI bleed. Upon arrival in the ED, she was hemodynamically stable with blood pressure in the 120s. Her hemoglobin went down to 9.7. Her creatinine was 1.2. Glucose 150. INR 1.08. Guaiac stool positive. CT of the head was negative for acute process. EKG showed normal sinus rhythm without significant ST-T wave changes. Gastroenterology was consulted and was planning colonoscopy. She was given one units of packed red  blood cell. While any [facility], she had another syncope shortly after finishing bowel movement. She was noted to have questionable 3rd degree vs 2:1 block on the telemetry. She was subsequently transferred to [facility] for further cardiology evaluation.   Inpatient Medications  . sodium chloride   Intravenous Once  . sodium chloride   Intravenous Once  . atorvastatin  40 mg Oral q1800  . lisinopril  20 mg Oral  Daily  . magnesium sulfate 1 - 4 g bolus IVPB  2 g Intravenous Once  . omega-3 acid ethyl esters  2 g Oral Daily  . pantoprazole (PROTONIX) IV  40 mg Intravenous Q24H  . sodium chloride flush  3 mL Intravenous Q12H    Family History Family History  Problem Relation Age of Onset  . Hypertension Brother      Social History Social History   Social History  . Marital status: Single    Spouse name: N/A  . Number of children: N/A  . Years of education: N/A   Occupational History  . Not on file.   Social History Main Topics  . Smoking status: Never Smoker  . Smokeless tobacco: Never Used  . Alcohol use No  . Drug use: No  . Sexual activity: No   Other Topics Concern  . Not on file   Social History Narrative  . No narrative on file     Review of Systems  General:  No chills, fever, night sweats or weight changes.  Cardiovascular:  No chest pain, dyspnea on exertion, edema, orthopnea, palpitations, paroxysmal nocturnal dyspnea. Dermatological: No rash, lesions/masses Respiratory: No cough, dyspnea Urologic: No hematuria, dysuria Abdominal:   No vomiting, diarrhea, melena, or hematemesis +nausea, bright red blood per rectum Neurologic:  No visual changes, wkns +syncope All other systems reviewed and are otherwise negative except as noted above.  Physical Exam  Blood pressure 140/80, pulse 79, temperature 99.2 F (37.3 C), temperature source Oral, resp. rate 20, height 5\' 5"  (1.651 m), weight 105.5 kg (232 lb 9.6 oz), SpO2 98 %.  General: Pleasant, NAD Psych: Normal affect. Neuro: Alert and oriented X 3. Moves all extremities spontaneously. HEENT: Normal  Neck: Supple without bruits or JVD. Lungs:  Resp regular and unlabored, CTA. Heart: RRR no s3, s4, or murmurs. Bounding pulse in her R neck. Abdomen: Soft, non-tender, non-distended, BS + x 4.  Extremities: No clubbing, cyanosis or edema. DP/PT/Radials 2+ and equal bilaterally.  Labs   Recent Labs   12/09/15 1333 12/10/15 1225  TROPONINI <0.03 <0.03   Lab Results  Component Value Date   WBC 8.7 12/10/2015   HGB 8.1 (L) 12/10/2015   HCT 25.7 (L) 12/10/2015   MCV 94.6 12/10/2015   PLT 221 12/10/2015     Recent Labs Lab 12/09/15 0638 12/10/15 0417  NA 139 141  K 4.1 4.4  CL 114* 117*  CO2 21* 21*  BUN 25* 24*  CREATININE 1.20* 1.20*  CALCIUM 8.6* 8.3*  PROT 6.6  --   BILITOT 0.5  --   ALKPHOS 68  --   ALT 12*  --   AST 19  --   GLUCOSE 150* 121*   Lab Results  Component Value Date   CHOL 214 (H) 10/19/2013   HDL 47 10/19/2013   LDLCALC 150 (H) 10/19/2013   TRIG 83 10/19/2013   No results found for: DDIMER  Radiology/Studies  Ct Head Wo Contrast  Result Date: 12/09/2015 CLINICAL DATA:  75 year old female with acute  syncope and collapse. EXAM: CT HEAD WITHOUT CONTRAST TECHNIQUE: Contiguous axial images were obtained from the base of the skull through the vertex without intravenous contrast. COMPARISON:  None. FINDINGS: Mild probable chronic small-vessel white matter ischemic changes are noted. Bilateral basal ganglia calcifications noted -physiologic versus metabolic. No acute intracranial abnormalities are identified, including mass lesion or mass effect, hydrocephalus, extra-axial fluid collection, midline shift, hemorrhage, or acute infarction. The visualized bony calvarium is unremarkable. IMPRESSION: No evidence of acute intracranial abnormality. Mild probable chronic small-vessel white matter ischemic changes. Electronically Signed   By: Margarette Canada M.D.   On: 12/09/2015 17:33   ECG  Normal sinus rhythm without significant ST-T wave changes.  ASSESSMENT AND PLAN  1. Recurrent syncope: Likely multifactorial, potential triggers include vasovagal as both episodes occurred after having BM, GI bleed with hypovolemia, high degree AV block 2/2 chronic use of beta blocker.  - Will hold beta blocker for now. Continue to monitor. Continue workup for GI bleed and  replete hemoglobin as needed. If continued to have high degree AV block despite holding beta blocker or has recurrent symptom, may need pacemaker.   - If has recurrent high degree AV block, prior to pacemaker placement, may need to consider ischemic workup to make sure that her previous RCA stents still patent.  - If no further high degree AV block is seen, patient likely will still need a 30 day event monitor upon discharge.  2. Acute anemia with lower GI bleeding  - pancolonic diverticulosis. Guaiac stool positive. BRBPR  - s/p 1 unit PRBC. Further workup per GI  3. CAD s/p overlapping stents to RCA in 08/2013  4. HTN: stable.  5. h/o goiter: TSH borderline low.   Signed, Avenir Lozinski, PA-C 12/10/2015, 3:26 PM  I have seen and examined the patient along with Almyra Deforest, PA-C.  I have reviewed the chart, notes and new data.  I agree with PA's note.  Key new complaints: no angina, no new episodes of syncope/presyncope. Key examination changes: hyperpulsatile right carotid, soft S4, no murmurs Key new findings / data: reviewed rhythm strips  There is no evidence of 3rd degree AV block. During  NSR, the PR interval is actually remarkably short at 125-135 ms. During the symptomatic event, there is simultaneous slowing of the sinus rate and 2:1 second degree AV block. Some PVCs are superimposed on the rhythm. There is only mild bradycardia. The QRS remains narrow, unchanged from baseline. Following the resolution of the second degree AV block, the emerging rhythm is sinus tachycardia with normal PR and narrow QRS.  All in all, the rhythm changes are strongly suggestive of a cardioinhibitory vagal event. I do not think they represent true conduction system disease. Likewise, the suspicion for coronary ischemia is low.  PLAN: Reduce beta blocker. 14 day event monitor at discharge,. Consider Lexiscan Myoview if plan for major surgery such as a partial colectomy/colectomy. If she needs  coronary stenting in the future, may be better suited for bare metal stents rather than drug-eluting stents.   Sanda Klein, MD, [place] [phone] 12/10/2015, 3:26 PM

## 2015-12-10 NOTE — Progress Notes (Signed)
Patient says she is being transferred to [facility] cardiac evaluation and possible pacemaker placement. She had another syncopal episode and documented have third-degree heart block. She received 1 unit of PRBCs yesterday evening in her hemoglobin from this morning is 8.4. She appears to have stopped bleeding. She can have GI evaluation at [facility] return for outpatient evaluation.

## 2015-12-10 NOTE — Progress Notes (Signed)
D- While patient was attempting to stand to get back in bed from the bedside commode after having a bloody bowel movement she became unresponsive and we were unable to arouse her.  A- Rapid response called.  NT and myself called for assistance and with help of other staff members were able to get the patient back in bed.  She was diaphoretic and unresponsive.  Vital signs were WNL(See flowsheets) and accucheck =135.  Dr. Marin Comment notified to come to bedside to assess patient. Respirations remained even and unlabored and her radial pulse was palpable throughout the event.  After a sternal rub with Dr. Marin Comment at the bedside she opened her eyes and was disoriented but able to answer questions appropriately after remembering her surroundings.  Upon reviewing her cardiac rhythm on telemetry she was in a third degree heart block during the event and returned to a NSR confirmed by Dr. Marin Comment.  Pacing pads placed on patient and crashed cart placed out side the room as precautionary measure.    R-Patient in bed alert and following commands appropriately.  Dr. Marin Comment is contacting cardiology and patient will be transferred to [facility] for further evaluation of third degree heart block.  Patient and granddaughter at bedside notified of the need for transfer and they are agreeable.  Nursing staff to continue to monitor.

## 2015-12-10 NOTE — Consult Note (Signed)
Referring Provider: Dr. Tana Coast Primary Care Physician:  [name], MD Primary Gastroenterologist:  Althia Forts  Reason for Consultation:  Rectal bleeding  HPI: Shelly [name] is a 75 y.o. female who had the acute onset of bright red blood per rectum X 2 with associated dizziness to the point that she passed out. Denies any abdominal pain, melena, hematemesis, nausea, or vomiting. She reports having a similar episode in 2015 and had a colonoscopy by Dr. Benson Norway that showed diffuse diverticulosis without any active bleeding. Takes Diclofenac prn and other than 81 mg Aspirin she denies daily NSAIDs. Hgb 9.7 at outside facility yesterday [facility]) and now 8.4. She was seen by Dr. Laural Golden yesterday but transferred to [facility] due to cardiac issues. She had a small amount of rectal bleeding this morning. Multiple family members in the room.  Past Medical History:  Diagnosis Date  . Anginal pain ([place])   . Arthritis   . Coronary artery disease    SEVERE  . GERD (gastroesophageal reflux disease)   . GI bleed 12/2013  . Goiter   . Hypertension   . Myocardial infarction ([place]) 08/2013  . Pancolonic diverticulosis 12/2013    Past Surgical History:  Procedure Laterality Date  . COLONOSCOPY N/A 01/13/2014   Procedure: COLONOSCOPY;  Surgeon: Beryle Beams, MD;  Location: [place];  Service: Endoscopy;  Laterality: N/A;  . CORNEAL TRANSPLANT    . CORONARY ANGIOPLASTY  08/2013  . HEEL SPUR EXCISION    . LEFT HEART CATHETERIZATION WITH CORONARY ANGIOGRAM N/A 08/23/2013   Procedure: LEFT HEART CATHETERIZATION WITH CORONARY ANGIOGRAM;  Surgeon: Peter M Martinique, MD;  Location: [facility] CATH LAB;  Service: Cardiovascular;  Laterality: N/A;  . PERCUTANEOUS CORONARY STENT INTERVENTION (PCI-S) N/A 08/24/2013   Procedure: PERCUTANEOUS CORONARY STENT INTERVENTION (PCI-S);  Surgeon: Peter M Martinique, MD;  Location: [facility] CATH LAB;  Service: Cardiovascular;  Laterality: N/A;  . TONSILLECTOMY    . TUBAL LIGATION       Prior to Admission medications   Medication Sig Start Date End Date Taking? Authorizing Provider  aspirin EC 81 MG tablet Take 81 mg by mouth daily.   Yes Historical Provider, MD  atorvastatin (LIPITOR) 40 MG tablet TAKE ONE TABLET BY MOUTH DAILY AT 6 PM. 03/21/15  Yes Herminio Commons, MD  b complex vitamins capsule Take 1 capsule by mouth daily.   Yes Historical Provider, MD  diclofenac (VOLTAREN) 75 MG EC tablet Take 75 mg by mouth daily as needed for mild pain or moderate pain.    Yes Historical Provider, MD  ibuprofen (ADVIL,MOTRIN) 400 MG tablet Take 1 tablet (400 mg total) by mouth every 6 (six) hours as needed for headache or moderate pain. 09/01/15  Yes Merrily Pew, MD  lisinopril (PRINIVIL,ZESTRIL) 40 MG tablet Take 40 mg by mouth daily. 10/10/14  Yes Historical Provider, MD  metoprolol succinate (TOPROL-XL) 25 MG 24 hr tablet TAKE ONE TABLET BY MOUTH DAILY 07/12/15  Yes Herminio Commons, MD  nitroGLYCERIN (NITROSTAT) 0.4 MG SL tablet Place 1 tablet (0.4 mg total) under the tongue every 5 (five) minutes as needed for chest pain. 04/16/15  Yes Herminio Commons, MD  Omega-3 Fatty Acids (FISH OIL) 1000 MG CAPS Take 3,000 mg by mouth daily.    Yes Historical Provider, MD  pantoprazole (PROTONIX) 40 MG tablet TAKE ONE TABLET BY MOUTH DAILY 10/11/15  Yes Herminio Commons, MD    Scheduled Meds: . sodium chloride   Intravenous Once  . sodium chloride   Intravenous  Once  . atorvastatin  40 mg Oral q1800  . lisinopril  20 mg Oral Daily  . omega-3 acid ethyl esters  2 g Oral Daily  . pantoprazole (PROTONIX) IV  40 mg Intravenous Q24H  . sodium chloride flush  3 mL Intravenous Q12H   Continuous Infusions: . sodium chloride 70 mL/hr at 12/09/15 2114   PRN Meds:.acetaminophen **OR** acetaminophen, albuterol, nitroGLYCERIN, ondansetron **OR** ondansetron (ZOFRAN) IV  Allergies as of 12/09/2015 - Review Complete 12/09/2015  Allergen Reaction Noted  . Bayer advanced aspirin  [aspirin] Palpitations 08/22/2013  . Codeine Other (See Comments)     History reviewed. No pertinent family history.  Social History   Social History  . Marital status: Single    Spouse name: N/A  . Number of children: N/A  . Years of education: N/A   Occupational History  . Not on file.   Social History Main Topics  . Smoking status: Never Smoker  . Smokeless tobacco: Never Used  . Alcohol use No  . Drug use: No  . Sexual activity: No   Other Topics Concern  . Not on file   Social History Narrative  . No narrative on file    Review of Systems: All negative except as stated above in HPI.  Physical Exam: Vital signs: Vitals:   12/10/15 0450 12/10/15 0620  BP: (!) 150/81 140/80  Pulse: 81 79  Resp: 20 20  Temp: 99.1 F (37.3 C) 99.2 F (37.3 C)     General:   Alert,  Well-developed, well-nourished, pleasant and cooperative in NAD, elderly Head: atraumatic Eyes: anicteric sclera ENT: oropharynx clear Neck: supple, nontender Lungs:  Clear throughout to auscultation.   No wheezes, crackles, or rhonchi. No acute distress. Heart:  Regular rate and rhythm; no murmurs, clicks, rubs,  or gallops. Abdomen: soft, nontender, nondistended, +BS  Rectal:  Deferred Ext: no edema  GI:  Lab Results:  Recent Labs  12/09/15 1355 12/09/15 2316 12/10/15 0417 12/10/15 1113  WBC 6.0 8.1 8.7  --   HGB 8.4* 8.9* 8.4* 8.1*  HCT 26.4* 27.9* 26.3* 25.7*  PLT 246 215 221  --    BMET  Recent Labs  12/09/15 0638 12/10/15 0417  NA 139 141  K 4.1 4.4  CL 114* 117*  CO2 21* 21*  GLUCOSE 150* 121*  BUN 25* 24*  CREATININE 1.20* 1.20*  CALCIUM 8.6* 8.3*   LFT  Recent Labs  12/09/15 0638  PROT 6.6  ALBUMIN 3.0*  AST 19  ALT 12*  ALKPHOS 68  BILITOT 0.5   PT/INR  Recent Labs  12/09/15 0638  LABPROT 14.2  INR 1.08     Studies/Results: Ct Head Wo Contrast  Result Date: 12/09/2015 CLINICAL DATA:  75 year old female with acute syncope and collapse.  EXAM: CT HEAD WITHOUT CONTRAST TECHNIQUE: Contiguous axial images were obtained from the base of the skull through the vertex without intravenous contrast. COMPARISON:  None. FINDINGS: Mild probable chronic small-vessel white matter ischemic changes are noted. Bilateral basal ganglia calcifications noted -physiologic versus metabolic. No acute intracranial abnormalities are identified, including mass lesion or mass effect, hydrocephalus, extra-axial fluid collection, midline shift, hemorrhage, or acute infarction. The visualized bony calvarium is unremarkable. IMPRESSION: No evidence of acute intracranial abnormality. Mild probable chronic small-vessel white matter ischemic changes. Electronically Signed   By: Margarette Canada M.D.   On: 12/09/2015 17:33   Impression/Plan: 75 yo with acute onset of painless hematochezia likely due to a diverticular bleed. Hemodynamically stable. Since colonoscopy recently  done in 2015 would not recommend a repeat colonoscopy at this time. Continue supportive care. Cardiology evaluation pending. Continue clear liquid diet and if bleeding resolves then advance diet tomorrow. Will follow.    LOS: 1 day   [place] C.  12/10/2015, 12:58 PM  Pager [phone]  If no answer or after 5 PM call [phone]

## 2015-12-10 NOTE — Progress Notes (Signed)
  Echocardiogram 2D Echocardiogram has been performed.  Jennette Dubin 12/10/2015, 4:32 PM

## 2015-12-10 NOTE — Progress Notes (Signed)
[facility]                                                                              Patient Demographics  Shelly [name], is a 75 y.o. female, DOB - 01-27-1941, [id number]  Admit date - 12/09/2015   Admitting Physician Orvan Falconer, MD  Outpatient Primary MD for the patient is [facility], MD   Primary cardiologist: Dr Bronson Ing Primary GI: Dr. Benson Norway    Outpatient specialists:   LOS - 1  days    Chief Complaint  Patient presents with  . GI Bleeding       Brief summary   Patient is a 75 year old woman with a history of rectal bleeding secondary to pandiverticulosis/diverticular bleed in August 2015, CAD status post non-ST elevation MI with overlapping stents 08/2013, and HTN, who was admitted on 12/09/2015 for large volume rectal bleeding and syncopal episode. In the ED, she was afebrile and hemodynamically stable with a blood pressure in the [id number] systolically. Her lab data were significant for hemoglobin of 9.7, BUN of 25, creatinine of 1.20, glucose of 150, INR 1.08, and PTT of 25. She was admitted for further workup. Her hemoglobin decreased from 9.7-8.4. She was given 1 unit of packed red blood cell transfusion with appropriate increase in her hemoglobin. Early this morning, she had another large volume bloody stool and syncopal episode. Apparently her to telemetry strip showed a third-degree heart block. Dr. Marin Comment discussed the patient with cardiology at [facility] who recommended transfer to SDU for further evaluation.   Assessment & Plan    Principal Problem:   Lower GI bleeding With acute blood loss anemia - Patient has history of pandiverticulosis, was felt to be etiology of her lower GI bleeding 8/15. Underwent colonoscopy 8/15 which showed pan diverticulosis and recommended transfusion as needed - Patient presented to [facility] on 7/23 with rectal bleeding and syncopal episode. Hemoglobin was 9.7 at the time of admission. - GI was consulted, patient  was seen by Dr. Laural Golden, initially had recommended diagnostic laparoscopy for 7/24. However patient was transferred to [facility] for cardiac reasons. - Patient reports one episode of GI bleeding this morning, requested GI consult, serial H&H - Patient was transfused 1 unit of packed RBCs at [facility].   Active Problems: Syncopal episode with bradycardia and third-degree AV block:  - Patient had one episode at home and apparently 2 episodes in the hospital, 1 overnight. CT head showed no acute disease. - Initial etiology was thought to be secondary to vasovagal episode however telemetry strips showed third-degree heart block, symptomatic. Patient was transferred to [facility] for cardiology evaluation for pacemaker. - Cardiology consulted, will follow recommendations - Patient was on beta blocker prior to admission, which has been discontinued - TSH 0.3, check free T4, Mg, 2-D echo     Essential hypertension - Currently stable, continue lisinopril  Hyperlipidemia Continue statin   CAD (coronary artery disease) - trop x 1 neg, follow echo results  - currently no chest pain or tenderness of breath, holding aspirin due to GI bleeding, holding beta blockers due to third-degree heart block. Continue statin. Cardiology consulted.  Code Status: Discussed in detail with the patient, she wants to think about it. She did not want intubation however wanted CPR/ACLS. Subsequently wants to discuss with the daughter and let us know.  DVT Prophylaxis:   SCD's Family Communication: Discussed in detail with the patient, all imaging results, lab results explained to the patient and family members in the room.   Disposition Plan:   Time Spent in minutes  25 minutes  Procedures:  CT head  Consultants:   Cardiology-called GI-called  Antimicrobials:   None   Medications  Scheduled Meds: . sodium chloride   Intravenous Once  . sodium chloride   Intravenous Once  . atorvastatin   40 mg Oral q1800  . lisinopril  20 mg Oral Daily  . omega-3 acid ethyl esters  2 g Oral Daily  . pantoprazole (PROTONIX) IV  40 mg Intravenous Q24H  . sodium chloride flush  3 mL Intravenous Q12H   Continuous Infusions: . sodium chloride 70 mL/hr at 12/09/15 2114   PRN Meds:.acetaminophen **OR** acetaminophen, albuterol, nitroGLYCERIN, ondansetron **OR** ondansetron (ZOFRAN) IV   Antibiotics   Anti-infectives    None        Subjective:   Shelly [name] was seen and examined today. Per patient, she had one episode of GI bleeding this morning. Overnight events noted at [facility] with syncopal episode and third-degree heart block. Patient transferred to [facility]. Currently patient denies dizziness, chest pain, shortness of breath, abdominal pain, N/V/D/C, new weakness, numbess, tingling. No acute events overnight.    Objective:   Vitals:   12/09/15 2010 12/10/15 0230 12/10/15 0450 12/10/15 0620  BP: (!) 137/52 (!) 115/46 (!) 150/81 140/80  Pulse: 66 94 81 79  Resp: 16 20 20 20   Temp: 98.5 F (36.9 C) 99.5 F (37.5 C) 99.1 F (37.3 C) 99.2 F (37.3 C)  TempSrc: Oral Oral Oral Oral  SpO2: 100% 100% 98% 98%  Weight:      Height:        Intake/Output Summary (Last 24 hours) at 12/10/15 1058 Last data filed at 12/09/15 2332  Gross per 24 hour  Intake              695 ml  Output              150 ml  Net              545 ml     Wt Readings from Last 3 Encounters:  12/09/15 105.5 kg (232 lb 9.6 oz)  09/01/15 103 kg (227 lb)  07/24/15 106.1 kg (234 lb)     Exam  General: Alert and oriented x 3, NAD  HEENT:  PERRLA, EOMI, Anicteric Sclera, mucous membranes moist.   Neck: Supple, no JVD, no masses  Cardiovascular: S1 S2 auscultated, no rubs, murmurs or gallops. Regular rate and rhythm.  Respiratory: Clear to auscultation bilaterally, no wheezing, rales or rhonchi  Gastrointestinal: Soft, nontender, nondistended, + bowel sounds  Ext: no  cyanosis clubbing or edema  Neuro: AAOx3, Cr N's II- XII. Strength 5/5 upper and lower extremities bilaterally  Skin: No rashes  Psych: Normal affect and demeanor, alert and oriented x3    Data Reviewed:  I have personally reviewed following labs and imaging studies  Micro Results No results found for this or any previous visit (from the past 240 hour(s)).  Radiology Reports Ct Head Wo Contrast  Result Date: 12/09/2015 CLINICAL DATA:  75 year old female with acute syncope and collapse. EXAM:  CT HEAD WITHOUT CONTRAST TECHNIQUE: Contiguous axial images were obtained from the base of the skull through the vertex without intravenous contrast. COMPARISON:  None. FINDINGS: Mild probable chronic small-vessel white matter ischemic changes are noted. Bilateral basal ganglia calcifications noted -physiologic versus metabolic. No acute intracranial abnormalities are identified, including mass lesion or mass effect, hydrocephalus, extra-axial fluid collection, midline shift, hemorrhage, or acute infarction. The visualized bony calvarium is unremarkable. IMPRESSION: No evidence of acute intracranial abnormality. Mild probable chronic small-vessel white matter ischemic changes. Electronically Signed   By: Margarette Canada M.D.   On: 12/09/2015 17:33   Lab Data:  CBC:  Recent Labs Lab 12/09/15 0638 12/09/15 1355 12/09/15 2316 12/10/15 0417  WBC 6.5 6.0 8.1 8.7  NEUTROABS 4.1  --   --   --   HGB 9.7* 8.4* 8.9* 8.4*  HCT 30.0* 26.4* 27.9* 26.3*  MCV 96.5 96.7 94.6 94.6  PLT 255 246 215 [id number]   Basic Metabolic Panel:  Recent Labs Lab 12/09/15 0638 12/10/15 0417  NA 139 141  K 4.1 4.4  CL 114* 117*  CO2 21* 21*  GLUCOSE 150* 121*  BUN 25* 24*  CREATININE 1.20* 1.20*  CALCIUM 8.6* 8.3*   GFR: Estimated Creatinine Clearance: 49.6 mL/min (by C-G formula based on SCr of 1.2 mg/dL). Liver Function Tests:  Recent Labs Lab 12/09/15 0638  AST 19  ALT 12*  ALKPHOS 68  BILITOT 0.5  PROT  6.6  ALBUMIN 3.0*   No results for input(s): LIPASE, AMYLASE in the last 168 hours. No results for input(s): AMMONIA in the last 168 hours. Coagulation Profile:  Recent Labs Lab 12/09/15 0638  INR 1.08   Cardiac Enzymes:  Recent Labs Lab 12/09/15 1333  TROPONINI <0.03   BNP (last 3 results) No results for input(s): PROBNP in the last 8760 hours. HbA1C:  Recent Labs  12/09/15 1333  HGBA1C 6.0*   CBG:  Recent Labs Lab 12/10/15 0444  GLUCAP 135*   Lipid Profile: No results for input(s): CHOL, HDL, LDLCALC, TRIG, CHOLHDL, LDLDIRECT in the last 72 hours. Thyroid Function Tests:  Recent Labs  12/09/15 0833  TSH 0.324*   Anemia Panel: No results for input(s): VITAMINB12, FOLATE, FERRITIN, TIBC, IRON, RETICCTPCT in the last 72 hours. Urine analysis:    Component Value Date/Time   COLORURINE YELLOW 09/01/2015 1918   APPEARANCEUR CLEAR 09/01/2015 1918   LABSPEC 1.025 09/01/2015 1918   PHURINE 6.0 09/01/2015 1918   GLUCOSEU NEGATIVE 09/01/2015 1918   HGBUR NEGATIVE 09/01/2015 1918   BILIRUBINUR NEGATIVE 09/01/2015 1918   KETONESUR NEGATIVE 09/01/2015 1918   PROTEINUR NEGATIVE 09/01/2015 1918   UROBILINOGEN 0.2 08/01/2014 0003   NITRITE NEGATIVE 09/01/2015 1918   LEUKOCYTESUR TRACE (A) 09/01/2015 1918     Monifa Blanchette M.D. [facility] 12/10/2015, 10:58 AM  Pager: [phone] Between 7am to 7pm - call Pager - [phone]  After 7pm go to [email or web address] - password [email or web address]  Call night coverage person covering after 7pm

## 2015-12-11 DIAGNOSIS — K922 Gastrointestinal hemorrhage, unspecified: Secondary | ICD-10-CM

## 2015-12-11 DIAGNOSIS — D62 Acute posthemorrhagic anemia: Secondary | ICD-10-CM | POA: Diagnosis not present

## 2015-12-11 DIAGNOSIS — D649 Anemia, unspecified: Secondary | ICD-10-CM | POA: Diagnosis not present

## 2015-12-11 DIAGNOSIS — K625 Hemorrhage of anus and rectum: Secondary | ICD-10-CM | POA: Diagnosis not present

## 2015-12-11 LAB — BASIC METABOLIC PANEL
Anion gap: 3 — ABNORMAL LOW (ref 5–15)
BUN: 18 mg/dL (ref 6–20)
CHLORIDE: 115 mmol/L — AB (ref 101–111)
CO2: 22 mmol/L (ref 22–32)
CREATININE: 1.13 mg/dL — AB (ref 0.44–1.00)
Calcium: 8.6 mg/dL — ABNORMAL LOW (ref 8.9–10.3)
GFR calc non Af Amer: 47 mL/min — ABNORMAL LOW (ref >60)
GFR, EST AFRICAN AMERICAN: 54 mL/min — AB (ref >60)
Glucose, Bld: 109 mg/dL — ABNORMAL HIGH (ref 65–99)
POTASSIUM: 4.5 mmol/L (ref 3.5–5.1)
SODIUM: 140 mmol/L (ref 135–145)

## 2015-12-11 LAB — T4, FREE: FREE T4: 1.04 ng/dL (ref 0.61–1.12)

## 2015-12-11 LAB — TROPONIN I

## 2015-12-11 LAB — HEMOGLOBIN AND HEMATOCRIT, BLOOD
HEMATOCRIT: 25.1 % — AB (ref 36.0–46.0)
HEMATOCRIT: 24.3 % — AB (ref 36.0–46.0)
HEMOGLOBIN: 8.3 g/dL — AB (ref 12.0–15.0)
Hemoglobin: 8.1 g/dL — ABNORMAL LOW (ref 12.0–15.0)

## 2015-12-11 MED ORDER — PEG 3350-KCL-NA BICARB-NACL 420 G PO SOLR
4000.0000 mL | Freq: Once | ORAL | Status: DC
  Filled 2015-12-11: qty 4000

## 2015-12-11 MED ORDER — POLYETHYLENE GLYCOL 3350 17 G PO PACK
17.0000 g | PACK | Freq: Two times a day (BID) | ORAL | Status: DC
  Administered 2015-12-11 – 2015-12-12 (×2): 17 g via ORAL
  Filled 2015-12-11 (×2): qty 1

## 2015-12-11 MED ORDER — MUSCLE RUB 10-15 % EX CREA
TOPICAL_CREAM | CUTANEOUS | Status: DC | PRN
  Filled 2015-12-11: qty 85

## 2015-12-11 MED ORDER — METOPROLOL SUCCINATE ER 25 MG PO TB24
12.5000 mg | ORAL_TABLET | Freq: Every day | ORAL | Status: DC
  Administered 2015-12-11 – 2015-12-16 (×6): 12.5 mg via ORAL
  Filled 2015-12-11 (×7): qty 1

## 2015-12-11 MED ORDER — SODIUM CHLORIDE 0.9 % IV SOLN
INTRAVENOUS | Status: DC
  Administered 2015-12-12: 09:00:00 via INTRAVENOUS

## 2015-12-11 NOTE — Progress Notes (Signed)
Pt with another large dark and bright red bloody stool. Michail Sermon, MD paged.

## 2015-12-11 NOTE — Care Management CC44 (Signed)
Condition Code 44 Documentation Completed  Patient Details  Name: Shelly [name] MRN: [id number] Date of Birth: July 09, 1940   Condition Code 44 given:  Yes Patient signature on Condition Code 44 notice:  Yes Documentation of 2 MD's agreement:  Yes Code 44 added to claim:  Yes    Bethena Roys, RN 12/11/2015, 11:46 AM

## 2015-12-11 NOTE — Progress Notes (Signed)
[facility]                                                                              Patient Demographics  Shelly [name], is a 75 y.o. female, DOB - 09/04/1940, [id number]  Admit date - 12/09/2015   Admitting Physician Orvan Falconer, MD  Outpatient Primary MD for the patient is [facility], MD   Primary cardiologist: Dr Bronson Ing Primary GI: Dr. Benson Norway    Outpatient specialists:   LOS - 2  days    Chief Complaint  Patient presents with  . GI Bleeding       Brief summary   Patient is a 75 year old woman with a history of rectal bleeding secondary to pandiverticulosis/diverticular bleed in August 2015, CAD status post non-ST elevation MI with overlapping stents 08/2013, and HTN, who was admitted on 12/09/2015 for large volume rectal bleeding and syncopal episode. In the ED, she was afebrile and hemodynamically stable with a blood pressure in the [id number] systolically. Her lab data were significant for hemoglobin of 9.7, BUN of 25, creatinine of 1.20, glucose of 150, INR 1.08, and PTT of 25. She was admitted for further workup. Her hemoglobin decreased from 9.7-8.4. She was given 1 unit of packed red blood cell transfusion with appropriate increase in her hemoglobin. Early this morning, she had another large volume bloody stool and syncopal episode. Apparently her to telemetry strip showed a third-degree heart block. Dr. Marin Comment discussed the patient with cardiology at [facility] who recommended transfer to SDU for further evaluation.   Assessment & Plan    Principal Problem:   Lower GI bleeding With acute blood loss anemia - history of pandiverticulosis, was felt to be etiology of her lower GI bleeding 8/15. Underwent colonoscopy 8/15 which showed pan diverticulosis and recommended transfusion as needed - Patient presented to [facility] on 7/23 with rectal bleeding and syncopal episode. Hemoglobin was 9.7 at the time of admission. - GI was consulted, patient was seen by  Dr. Laural Golden, initially had recommended diagnostic laparoscopy for 7/24. However patient was transferred to [facility] for cardiac reasons/bradycardia with arrhythmia. -  Patient was transfused 1 unit of packed RBCs at [facility], 2 units overnight for hemoglobin of 7.1, hemoglobin 8.3 this morning - GI following, seen by Dr. Michail Sermon, will follow recommendations, tagged RBC scan?   Active Problems: Syncopal episode with bradycardia and  beta blocker induced AV block:  - Patient had one episode at home and apparently 2 episodes in the hospital, 1 overnight. CT head showed no acute disease. - Initial etiology was thought to be secondary to vasovagal episode however telemetry strips showed ?third-degree heart block, symptomatic. Patient was transferred to [facility] for cardiology evaluation.  - Cardiology consulted,beta blocker was reduced, recommended 30 day event monitor at discharge and outpatient stress test, inpatient only if major surgery planned - Patient was on beta blocker prior to admission, cardiology reduced beta blocker to 12.5 mg daily, no further arrhythmias -2-D echo showed EF of 55-60% with normal wall motion.    Essential hypertension - Currently stable, continue lisinopril  Hyperlipidemia Continue statin   CAD (coronary artery disease) -Troponins negative, no  chest pain or shortness of breath, 2-D echo showed EF of 55-60% with normal wall motion - currently no chest pain or tenderness of breath, holding aspirin due to GI bleeding,continue statin   Code Status: full code. Discussed in detail with the patient, she wants to think about it. She did not want intubation however wanted CPR/ACLS. Subsequently wants to discuss with the daughter and let us know.  DVT Prophylaxis:   SCD's Family Communication: Discussed in detail with the patient, all imaging results, lab results explained to the patient. Family member sleeping on the chair.   Disposition Plan:   Time Spent in minutes   25 minutes  Procedures:  CT head 2-D echo  Consultants:   Cardiology-Dr Croitoru GI- Dr Michail Sermon  Antimicrobials:   None   Medications  Scheduled Meds: . sodium chloride   Intravenous Once  . sodium chloride   Intravenous Once  . sodium chloride   Intravenous Once  . atorvastatin  40 mg Oral q1800  . lisinopril  20 mg Oral Daily  . metoprolol succinate  12.5 mg Oral Daily  . omega-3 acid ethyl esters  2 g Oral Daily  . pantoprazole (PROTONIX) IV  40 mg Intravenous Q24H  . sodium chloride flush  3 mL Intravenous Q12H   Continuous Infusions:   PRN Meds:.acetaminophen **OR** acetaminophen, albuterol, nitroGLYCERIN, ondansetron **OR** ondansetron (ZOFRAN) IV   Antibiotics   Anti-infectives    None        Subjective:   [name] was seen and examined today. Patient still having GI bleeding, one episode this morning. Otherwise no chest pain or shortness of breath, dizziness or lightheadedness. Overnight received 2 units of packed RBCs. Denies abdominal pain,nausea or vomiting. No acute events overnight.    Objective:   Vitals:   12/11/15 0316 12/11/15 0339 12/11/15 0500 12/11/15 0650  BP: 129/61 (!) 123/53  (!) 117/53  Pulse: 92 96  81  Resp: (!) 21 20  15   Temp: 98.2 F (36.8 C) 98.6 F (37 C)  98.7 F (37.1 C)  TempSrc: Oral Oral  Oral  SpO2: 100% 100%  98%  Weight:   107.3 kg (236 lb 9.6 oz)   Height:        Intake/Output Summary (Last 24 hours) at 12/11/15 1050 Last data filed at 12/11/15 0645  Gross per 24 hour  Intake             2904 ml  Output              700 ml  Net             2204 ml     Wt Readings from Last 3 Encounters:  12/11/15 107.3 kg (236 lb 9.6 oz)  09/01/15 103 kg (227 lb)  07/24/15 106.1 kg (234 lb)     Exam  General: Alert and oriented x 3, NAD  HEENT  Neck: Supple, no JVD  Cardiovascular: S1 S2 auscultated, no rubs, murmurs or gallops. Regular rate and rhythm.  Respiratory: Clear to auscultation  bilaterally, no wheezing, rales or rhonchi  Gastrointestinal: Soft, nontender, nondistended, + bowel sounds  Ext: no cyanosis clubbing or edema  Neuro:No new deficits   Skin: No rashes  Psych: Normal affect and demeanor, alert and oriented x3    Data Reviewed:  I have personally reviewed following labs and imaging studies  Micro Results Recent Results (from the past 240 hour(s))  MRSA PCR Screening     Status: None   Collection Time:  12/10/15  9:29 AM  Result Value Ref Range Status   MRSA by PCR NEGATIVE NEGATIVE Final    Comment:        The GeneXpert MRSA Assay (FDA approved for NASAL specimens only), is one component of a comprehensive MRSA colonization surveillance program. It is not intended to diagnose MRSA infection nor to guide or monitor treatment for MRSA infections.     Radiology Reports Ct Head Wo Contrast  Result Date: 12/09/2015 CLINICAL DATA:  75 year old female with acute syncope and collapse. EXAM: CT HEAD WITHOUT CONTRAST TECHNIQUE: Contiguous axial images were obtained from the base of the skull through the vertex without intravenous contrast. COMPARISON:  None. FINDINGS: Mild probable chronic small-vessel white matter ischemic changes are noted. Bilateral basal ganglia calcifications noted -physiologic versus metabolic. No acute intracranial abnormalities are identified, including mass lesion or mass effect, hydrocephalus, extra-axial fluid collection, midline shift, hemorrhage, or acute infarction. The visualized bony calvarium is unremarkable. IMPRESSION: No evidence of acute intracranial abnormality. Mild probable chronic small-vessel white matter ischemic changes. Electronically Signed   By: Margarette Canada M.D.   On: 12/09/2015 17:33   Lab Data:  CBC:  Recent Labs Lab 12/09/15 0638 12/09/15 1355 12/09/15 2316 12/10/15 0417 12/10/15 1113 12/10/15 1712 12/10/15 1942 12/11/15 1012  WBC 6.5 6.0 8.1 8.7  --   --   --   --   NEUTROABS 4.1  --   --    --   --   --   --   --   HGB 9.7* 8.4* 8.9* 8.4* 8.1* 7.5* 7.1* 8.3*  HCT 30.0* 26.4* 27.9* 26.3* 25.7* 23.6* 22.4* 25.1*  MCV 96.5 96.7 94.6 94.6  --   --   --   --   PLT 255 246 215 221  --   --   --   --    Basic Metabolic Panel:  Recent Labs Lab 12/09/15 0638 12/10/15 0417 12/10/15 1225  NA 139 141  --   K 4.1 4.4  --   CL 114* 117*  --   CO2 21* 21*  --   GLUCOSE 150* 121*  --   BUN 25* 24*  --   CREATININE 1.20* 1.20*  --   CALCIUM 8.6* 8.3*  --   MG  --   --  1.8   GFR: Estimated Creatinine Clearance: 50.1 mL/min (by C-G formula based on SCr of 1.2 mg/dL). Liver Function Tests:  Recent Labs Lab 12/09/15 0638  AST 19  ALT 12*  ALKPHOS 68  BILITOT 0.5  PROT 6.6  ALBUMIN 3.0*   No results for input(s): LIPASE, AMYLASE in the last 168 hours. No results for input(s): AMMONIA in the last 168 hours. Coagulation Profile:  Recent Labs Lab 12/09/15 0638  INR 1.08   Cardiac Enzymes:  Recent Labs Lab 12/09/15 1333 12/10/15 1225 12/10/15 1712  TROPONINI <0.03 <0.03 <0.03   BNP (last 3 results) No results for input(s): PROBNP in the last 8760 hours. HbA1C:  Recent Labs  12/09/15 1333  HGBA1C 6.0*   CBG:  Recent Labs Lab 12/10/15 0444  GLUCAP 135*   Lipid Profile: No results for input(s): CHOL, HDL, LDLCALC, TRIG, CHOLHDL, LDLDIRECT in the last 72 hours. Thyroid Function Tests:  Recent Labs  12/09/15 0833  TSH 0.324*   Anemia Panel:  Recent Labs  12/10/15 0417  VITAMINB12 448   Urine analysis:    Component Value Date/Time   COLORURINE YELLOW 09/01/2015 1918   APPEARANCEUR CLEAR 09/01/2015 1918  LABSPEC 1.025 09/01/2015 1918   PHURINE 6.0 09/01/2015 1918   GLUCOSEU NEGATIVE 09/01/2015 1918   HGBUR NEGATIVE 09/01/2015 1918   BILIRUBINUR NEGATIVE 09/01/2015 1918   KETONESUR NEGATIVE 09/01/2015 1918   PROTEINUR NEGATIVE 09/01/2015 1918   UROBILINOGEN 0.2 08/01/2014 0003   NITRITE NEGATIVE 09/01/2015 1918   LEUKOCYTESUR TRACE  (A) 09/01/2015 1918     Louis Ivery M.D. [facility] 12/11/2015, 10:50 AM  Pager: [phone] Between 7am to 7pm - call Pager - [phone]  After 7pm go to [email or web address] - password [email or web address]  Call night coverage person covering after 7pm

## 2015-12-11 NOTE — Progress Notes (Addendum)
Patient ID: Shelly [name], female   DOB: 04/03/1941, 75 y.o.   MRN: [id number] [facility]astro[facility] Progress Note  Shelly [name] 75 y.o. [birth year]   Subjective: Continues to have dark red bloody stools. Denies abdominal pain or dizziness.  Objective: Vital signs in last 24 hours: Vitals:   12/11/15 1216 12/11/15 1634  BP:  117/53  Pulse:  81  Resp:  15  Temp: 98.5 F (36.9 C) 98.4 F (36.9 C)    Physical Exam: Gen: alert, no acute distress, obese HEENT: anicteric sclera CV: RRR Chest: CTA B Abd: soft, nontender, nondistended, +BS  Lab Results:  Recent Labs  12/10/15 0417 12/10/15 1225 12/11/15 1012  NA 141  --  140  K 4.4  --  4.5  CL 117*  --  115*  CO2 21*  --  22  GLUCOSE 121*  --  109*  BUN 24*  --  18  CREATININE 1.20*  --  1.13*  CALCIUM 8.3*  --  8.6*  MG  --  1.8  --     Recent Labs  12/09/15 0638  AST 19  ALT 12*  ALKPHOS 68  BILITOT 0.5  PROT 6.6  ALBUMIN 3.0*    Recent Labs  12/09/15 0638  12/09/15 2316 12/10/15 0417  12/11/15 1012 12/11/15 1805  WBC 6.5  < > 8.1 8.7  --   --   --   NEUTROABS 4.1  --   --   --   --   --   --   HGB 9.7*  < > 8.9* 8.4*  < > 8.3* 8.1*  HCT 30.0*  < > 27.9* 26.3*  < > 25.1* 24.3*  MCV 96.5  < > 94.6 94.6  --   --   --   PLT 255  < > 215 221  --   --   --   < > = values in this interval not displayed.  Recent Labs  12/09/15 0638  LABPROT 14.2  INR 1.08      Assessment/Plan: GI bleed likely diverticular that has continued to occur so will give small doses of Miralax this evening and GoLytely tomorrow with a plan to do a repeat colonoscopy on Thursday 12/13/15 to see if source can be identified. Doubt upper or small intestinal source. Clear liquid diet. Patient agreeable to proceed.   Mellott C. 12/11/2015, 7:10 PM  Pager [phone]  If no answer or after 5 PM call [phone]

## 2015-12-11 NOTE — Progress Notes (Signed)
Spoke with Oletta Lamas, MD on call for [facility]. Updated him on patient. No new orders at this time.

## 2015-12-11 NOTE — Progress Notes (Signed)
Patient Name: Shelly [name] Date of Encounter: 12/11/2015  Hospital Problem List     Principal Problem:   Lower GI bleeding Active Problems:   Essential hypertension   CAD (coronary artery disease)   Rectal bleed   Syncope and collapse   Acute blood loss anemia   Pancolonic diverticulosis   Third degree heart block (HCC)    Subjective   Reports 2 bowel movements with BRB last night.   Inpatient Medications    . sodium chloride   Intravenous Once  . sodium chloride   Intravenous Once  . sodium chloride   Intravenous Once  . atorvastatin  40 mg Oral q1800  . lisinopril  20 mg Oral Daily  . omega-3 acid ethyl esters  2 g Oral Daily  . pantoprazole (PROTONIX) IV  40 mg Intravenous Q24H  . sodium chloride flush  3 mL Intravenous Q12H    Vital Signs    Vitals:   12/11/15 0316 12/11/15 0339 12/11/15 0500 12/11/15 0650  BP: 129/61 (!) 123/53  (!) 117/53  Pulse: 92 96  81  Resp: (!) 21 20  15   Temp: 98.2 F (36.8 C) 98.6 F (37 C)  98.7 F (37.1 C)  TempSrc: Oral Oral  Oral  SpO2: 100% 100%  98%  Weight:   236 lb 9.6 oz (107.3 kg)   Height:        Intake/Output Summary (Last 24 hours) at 12/11/15 0944 Last data filed at 12/11/15 0645  Gross per 24 hour  Intake             2904 ml  Output              700 ml  Net             2204 ml   Filed Weights   12/09/15 0629 12/09/15 1102 12/11/15 0500  Weight: 224 lb (101.6 kg) 232 lb 9.6 oz (105.5 kg) 236 lb 9.6 oz (107.3 kg)    Physical Exam    General: Pleasant, NAD. Neuro: Alert and oriented X 3. Moves all extremities spontaneously. Psych: Normal affect. HEENT:  Normal  Neck: Supple without bruits or JVD. Lungs:  Resp regular and unlabored, CTA. Heart: RRR no s3, s4, or murmurs. Abdomen: Soft, non-tender, non-distended, BS + x 4.  Extremities: No clubbing, cyanosis or edema. DP/PT/Radials 2+ and equal bilaterally.  Labs    CBC  Recent Labs  12/09/15 0638  12/09/15 2316 12/10/15 0417   12/10/15 1712 12/10/15 1942  WBC 6.5  < > 8.1 8.7  --   --   --   NEUTROABS 4.1  --   --   --   --   --   --   HGB 9.7*  < > 8.9* 8.4*  < > 7.5* 7.1*  HCT 30.0*  < > 27.9* 26.3*  < > 23.6* 22.4*  MCV 96.5  < > 94.6 94.6  --   --   --   PLT 255  < > 215 221  --   --   --   < > = values in this interval not displayed. Basic Metabolic Panel  Recent Labs  12/09/15 0638 12/10/15 0417 12/10/15 1225  NA 139 141  --   K 4.1 4.4  --   CL 114* 117*  --   CO2 21* 21*  --   GLUCOSE 150* 121*  --   BUN 25* 24*  --   CREATININE 1.20* 1.20*  --  CALCIUM 8.6* 8.3*  --   MG  --   --  1.8   Liver Function Tests  Recent Labs  12/09/15 0638  AST 19  ALT 12*  ALKPHOS 68  BILITOT 0.5  PROT 6.6  ALBUMIN 3.0*   No results for input(s): LIPASE, AMYLASE in the last 72 hours. Cardiac Enzymes  Recent Labs  12/09/15 1333 12/10/15 1225 12/10/15 1712  TROPONINI <0.03 <0.03 <0.03   BNP Invalid input(s): POCBNP D-Dimer No results for input(s): DDIMER in the last 72 hours. Hemoglobin A1C  Recent Labs  12/09/15 1333  HGBA1C 6.0*   Fasting Lipid Panel No results for input(s): CHOL, HDL, LDLCALC, TRIG, CHOLHDL, LDLDIRECT in the last 72 hours. Thyroid Function Tests  Recent Labs  12/09/15 0833  TSH 0.324*    Telemetry    SR with come episodes of tachycardia  ECG    No recent  Radiology    TTE: 12/10/2015  Study Conclusions  - Left ventricle: The cavity size was normal. Wall thickness was   increased in a pattern of mild LVH. Systolic function was normal.   The estimated ejection fraction was in the range of 55% to 60%.   Wall motion was normal; there were no regional wall motion   abnormalities. Left ventricular diastolic function parameters   were normal. - Atrial septum: No defect or patent foramen ovale was identified.  Assessment & Plan    Shelly [name] is a pleasant 75 yo AA female with PMH of CAD s/p overlapping stent to prox and mid RCA in 08/2013, HTN,  h/o goiter, and pancolonic diverticulosis presented with GI bleeding, while sitting on the toilet, she had an episode of syncope. While at [facility], she had 2nd syncope with high degree AV block after finishing bowel movement  1. Recurrent syncope: Likely multifactorial, triggers include vasovagal as both episodes occurred after having BM, GI bleed with hypovolemia, high degree AV block 2/2 chronic use of beta blocker. -- BB reduced to 12.5 mg XL daily, no further arrhythmias noted to telemetry -- Continue to monitor. Continue workup for GI bleed and replete hemoglobin as needed. Consider Lexiscan  Myoview if plan for major surgery such as a partial colectomy/colectomy.     2. Acute anemia with lower GI bleeding                       - pancolonic diverticulosis. Guaiac stool positive. BRBPR                       - s/p 2 unit PRBC. Further workup per GI  3. CAD s/p overlapping stents to RCA in 08/2013  4. HTN: stable.  5. h/o goiter: TSH borderline low.  Signed, Reino Bellis NP-C Pager [phone]   I have seen and examined the patient along with Reino Bellis NP-C.  I have reviewed the chart, notes and new data.  I agree with NP's note.   PLAN:  No further syncope and no arrhythmia on monitor. Plan 30 day event monitor at DC and outpatient Lexiscan Myoview (would only get the scan as an inpatient if there is imminent need for major abdominal surgery).  Sanda Klein, MD, [place] [phone] 12/11/2015, 9:58 AM

## 2015-12-11 NOTE — Care Management Obs Status (Signed)
[place] NOTIFICATION   Patient Details  Name: Shelly [name] MRN: [id number] Date of Birth: 12-20-1940   Medicare Observation Status Notification Given:  Yes    Bethena Roys, RN 12/11/2015, 11:46 AM

## 2015-12-11 NOTE — Progress Notes (Signed)
Pt HGB 8.3 post transfusion. Michail Sermon, MD made aware of pt's bloody stool this am. MD to see pt. MD verbal order for clear liquid diet. Will continue to monitor and report any further bloody stools.

## 2015-12-11 NOTE — Progress Notes (Addendum)
Pt had large dark and bright red bloody bowel movement. Rai, MD made aware.

## 2015-12-12 DIAGNOSIS — M545 Low back pain: Secondary | ICD-10-CM | POA: Diagnosis present

## 2015-12-12 DIAGNOSIS — R55 Syncope and collapse: Secondary | ICD-10-CM | POA: Diagnosis present

## 2015-12-12 DIAGNOSIS — Z886 Allergy status to analgesic agent status: Secondary | ICD-10-CM | POA: Diagnosis not present

## 2015-12-12 DIAGNOSIS — K625 Hemorrhage of anus and rectum: Secondary | ICD-10-CM

## 2015-12-12 DIAGNOSIS — I2510 Atherosclerotic heart disease of native coronary artery without angina pectoris: Secondary | ICD-10-CM | POA: Diagnosis not present

## 2015-12-12 DIAGNOSIS — I25119 Atherosclerotic heart disease of native coronary artery with unspecified angina pectoris: Secondary | ICD-10-CM | POA: Diagnosis present

## 2015-12-12 DIAGNOSIS — Z885 Allergy status to narcotic agent status: Secondary | ICD-10-CM | POA: Diagnosis not present

## 2015-12-12 DIAGNOSIS — K922 Gastrointestinal hemorrhage, unspecified: Secondary | ICD-10-CM | POA: Diagnosis not present

## 2015-12-12 DIAGNOSIS — D62 Acute posthemorrhagic anemia: Secondary | ICD-10-CM | POA: Diagnosis present

## 2015-12-12 DIAGNOSIS — I10 Essential (primary) hypertension: Secondary | ICD-10-CM | POA: Diagnosis present

## 2015-12-12 DIAGNOSIS — Z79899 Other long term (current) drug therapy: Secondary | ICD-10-CM | POA: Diagnosis not present

## 2015-12-12 DIAGNOSIS — Z955 Presence of coronary angioplasty implant and graft: Secondary | ICD-10-CM | POA: Diagnosis not present

## 2015-12-12 DIAGNOSIS — K640 First degree hemorrhoids: Secondary | ICD-10-CM | POA: Diagnosis present

## 2015-12-12 DIAGNOSIS — R001 Bradycardia, unspecified: Secondary | ICD-10-CM | POA: Diagnosis present

## 2015-12-12 DIAGNOSIS — Z8249 Family history of ischemic heart disease and other diseases of the circulatory system: Secondary | ICD-10-CM | POA: Diagnosis not present

## 2015-12-12 DIAGNOSIS — E785 Hyperlipidemia, unspecified: Secondary | ICD-10-CM | POA: Diagnosis present

## 2015-12-12 DIAGNOSIS — K5731 Diverticulosis of large intestine without perforation or abscess with bleeding: Secondary | ICD-10-CM | POA: Diagnosis present

## 2015-12-12 DIAGNOSIS — I252 Old myocardial infarction: Secondary | ICD-10-CM | POA: Diagnosis not present

## 2015-12-12 DIAGNOSIS — K219 Gastro-esophageal reflux disease without esophagitis: Secondary | ICD-10-CM | POA: Diagnosis present

## 2015-12-12 DIAGNOSIS — Z7982 Long term (current) use of aspirin: Secondary | ICD-10-CM | POA: Diagnosis not present

## 2015-12-12 DIAGNOSIS — G8929 Other chronic pain: Secondary | ICD-10-CM | POA: Diagnosis present

## 2015-12-12 DIAGNOSIS — I442 Atrioventricular block, complete: Secondary | ICD-10-CM | POA: Diagnosis present

## 2015-12-12 DIAGNOSIS — R739 Hyperglycemia, unspecified: Secondary | ICD-10-CM | POA: Diagnosis present

## 2015-12-12 DIAGNOSIS — I959 Hypotension, unspecified: Secondary | ICD-10-CM | POA: Diagnosis present

## 2015-12-12 LAB — BASIC METABOLIC PANEL
Anion gap: 5 (ref 5–15)
BUN: 18 mg/dL (ref 6–20)
CHLORIDE: 112 mmol/L — AB (ref 101–111)
CO2: 22 mmol/L (ref 22–32)
CREATININE: 1.19 mg/dL — AB (ref 0.44–1.00)
Calcium: 8.7 mg/dL — ABNORMAL LOW (ref 8.9–10.3)
GFR calc Af Amer: 51 mL/min — ABNORMAL LOW (ref >60)
GFR calc non Af Amer: 44 mL/min — ABNORMAL LOW (ref >60)
GLUCOSE: 110 mg/dL — AB (ref 65–99)
POTASSIUM: 4.2 mmol/L (ref 3.5–5.1)
SODIUM: 139 mmol/L (ref 135–145)

## 2015-12-12 LAB — PREPARE RBC (CROSSMATCH)

## 2015-12-12 LAB — HEMOGLOBIN AND HEMATOCRIT, BLOOD
HCT: 23.2 % — ABNORMAL LOW (ref 36.0–46.0)
HEMATOCRIT: 27.6 % — AB (ref 36.0–46.0)
HEMATOCRIT: 23.6 % — AB (ref 36.0–46.0)
HEMOGLOBIN: 7.7 g/dL — AB (ref 12.0–15.0)
HEMOGLOBIN: 9 g/dL — AB (ref 12.0–15.0)
Hemoglobin: 7.8 g/dL — ABNORMAL LOW (ref 12.0–15.0)

## 2015-12-12 MED ORDER — PEG 3350-KCL-NA BICARB-NACL 420 G PO SOLR
4000.0000 mL | Freq: Once | ORAL | Status: AC
  Administered 2015-12-13: 4000 mL via ORAL
  Filled 2015-12-12: qty 4000

## 2015-12-12 MED ORDER — SODIUM CHLORIDE 0.9 % IV SOLN
Freq: Once | INTRAVENOUS | Status: DC
Start: 2015-12-12 — End: 2015-12-15

## 2015-12-12 MED ORDER — POLYETHYLENE GLYCOL 3350 17 GM/SCOOP PO POWD
1.0000 | Freq: Once | ORAL | Status: AC
  Administered 2015-12-12: 255 g via ORAL
  Filled 2015-12-12: qty 255

## 2015-12-12 NOTE — Progress Notes (Signed)
Pt assisted to [facility] by NT.  Pt stated she did not "feel right" and had a syncopal episode/loss of consciousness while sitting on BSC.  Syncopal episode lasted ~ 1 min.  HR increased to 120s, and O2 sats dropped to 76% briefly during syncopal episode.  Upon regaining consciousness, VSS and pt alert and oriented x 4.  States the last thing she remembers was getting onto the [facility].  Pt had a large bloody BM following syncopal episode, and was assisted back to bed by nursing staff without difficulty.  Forrest Moron, NP notified of the above.  Will recheck H&H and transfuse PRBCs per order.  Will continue to monitor.  Jodell Cipro

## 2015-12-12 NOTE — Progress Notes (Signed)
PROGRESS NOTE    Shelly [name]  [id number] DOB: June 16, 1940 DOA: 12/09/2015 PCP: [name], MD   Brief Narrative:  Patient is a 75 year old woman with a history of rectal bleeding secondary to pandiverticulosis/diverticular bleed in August 2015, CAD status post non-ST elevation MI with overlapping stents 08/2013, and HTN, who was admitted on 12/09/2015 for large volume rectal bleeding and syncopal episode. In the ED, she was afebrile and hemodynamically stable with a blood pressure in the [id number] systolically. Her lab data were significant for hemoglobin of 9.7, BUN of 25, creatinine of 1.20, glucose of 150, INR 1.08, and PTT of 25. She was admitted for further workup. Her hemoglobin decreased from 9.7-8.4. She was given 1 unit of packed red blood cell transfusion with appropriate increase in her hemoglobin. Early this morning, she had another large volume bloody stool and syncopal episode. Apparently her to telemetry strip showed a third-degree heart block. Dr. Marin Comment discussed the patient with cardiology at [facility] recommended transfer to SDU forfurther evaluation.    Assessment & Plan:   Principal Problem:   Lower GI bleeding Active Problems:   Essential hypertension   CAD (coronary artery disease)   Rectal bleed   Syncope and collapse   Acute blood loss anemia   Pancolonic diverticulosis   Third degree heart block (HCC)  #1 lower GI bleed Likely secondary to an acute diverticular bleed as patient does have a history of pandiverticulosis. Patient had prior lower GI bleed August 2015 and underwent colonoscopy which showed pandiverticulosis and transfusion as needed was recommended. Patient had presented to [facility] 12/09/2015 with rectal bleeding and a syncopal episode at that time hemoglobin was 9.7. GI, Dr. Wonda Amis saw the patient in consultation at [facility] and recommended diagnostic laparoscopy 12/10/2015 however due to cardiac reasons/bradycardia with arrhythmia  patient was transferred to [facility]. Patient was transfused 1 unit packed red blood cells are [facility], and has received a total of 5 units of packed red blood cells including 2 units to be given today. Patient has been seen in consultation by GI and patient is being prepped for colonoscopy to be done on Friday, 12/14/2015. Follow H&H. Per GI.  #2 syncopal episode with bradycardia and beta blocker induced AV block Prior to admission patient noted to have a syncopal episode at home and 2 episodes in the hospital. Patient had another episode last night while on the bedside commode. CT head negative. Etiology of syncopal episode multifactorial secondary to vasovagal as well as cardioinhibitory. Patient noted to have third-degree AV block on telemetry and subsequently transferred to [facility]. Patient has been seen in consultation by cardiology who had decrease patient's beta blocker and recommended a 30 day event monitor discharged outpatient stress test. Stress test may be done inpatient only if patient is plan for major surgery. 2-D echo with EF of 55-60%. Patient with no further arrhythmias on current dose of beta blocker. Cardiology following.  #3 acute blood loss anemia Secondary to problem #1. Status post 5 units packed red blood cells. Follow H&H.  #4 hypertension Stable.  #5 hyperlipidemia Continue statin.  #6 coronary artery disease Patient denies any chest pain or shortness of breath. Cardiac enzymes negative. 2-D echo EF 55-60% with no wall motion abnormalities. Aspirin on hold. Continue statin.  #3   DVT prophylaxis: SCDs Code Status: Full Family Communication: Updated patient. No family at bedside. Disposition Plan: Home once bleed has resolved and per GI.   Consultants:   Cardiology: Dr.Croitoru 12/10/2015  Gastroenterology: Dr. Michail Sermon 12/10/2015  Procedures:   Ct head 12/09/2015  Echo 12/10/2015  5 units PRBC  12/12/2015   Antimicrobials:  None   Subjective: Patient states had bloody bowel movement early this tomorrow with a near-syncopal episode while on bedside commode. No bloody bowel movement today. No chest pain. No shortness of breath.  Objective: Vitals:   12/12/15 0956 12/12/15 1003 12/12/15 1015 12/12/15 1035  BP: (!) 109/53 (!) 107/58 (!) 108/51 117/63  Pulse: 72     Resp: 12     Temp:  98.2 F (36.8 C) 98.4 F (36.9 C)   TempSrc:  Oral Oral   SpO2: 100%     Weight:      Height:        Intake/Output Summary (Last 24 hours) at 12/12/15 1153 Last data filed at 12/12/15 0859  Gross per 24 hour  Intake             1790 ml  Output              450 ml  Net             1340 ml   Filed Weights   12/09/15 0629 12/09/15 1102 12/11/15 0500  Weight: 101.6 kg (224 lb) 105.5 kg (232 lb 9.6 oz) 107.3 kg (236 lb 9.6 oz)    Examination:  General exam: Appears calm and comfortable  Respiratory system: Clear to auscultation on anterior lung fields. Respiratory effort normal. Cardiovascular system: S1 & S2 heard, RRR. No JVD, murmurs, rubs, gallops or clicks. No pedal edema. Gastrointestinal system: Abdomen is nondistended, soft and nontender. No organomegaly or masses felt. Normal bowel sounds heard. Central nervous system: Alert and oriented. No focal neurological deficits. Extremities: Symmetric 5 x 5 power. Skin: No rashes, lesions or ulcers Psychiatry: Judgement and insight appear normal. Mood & affect appropriate.     Data Reviewed: I have personally reviewed following labs and imaging studies  CBC:  Recent Labs Lab 12/09/15 0638 12/09/15 1355 12/09/15 2316 12/10/15 0417  12/10/15 1942 12/11/15 1012 12/11/15 1805 12/12/15 0006 12/12/15 0400  WBC 6.5 6.0 8.1 8.7  --   --   --   --   --   --   NEUTROABS 4.1  --   --   --   --   --   --   --   --   --   HGB 9.7* 8.4* 8.9* 8.4*  < > 7.1* 8.3* 8.1* 7.8* 7.7*  HCT 30.0* 26.4* 27.9* 26.3*  < > 22.4* 25.1* 24.3*  23.6* 23.2*  MCV 96.5 96.7 94.6 94.6  --   --   --   --   --   --   PLT 255 246 215 221  --   --   --   --   --   --   < > = values in this interval not displayed. Basic Metabolic Panel:  Recent Labs Lab 12/09/15 0638 12/10/15 0417 12/10/15 1225 12/11/15 1012 12/12/15 0006  NA 139 141  --  140 139  K 4.1 4.4  --  4.5 4.2  CL 114* 117*  --  115* 112*  CO2 21* 21*  --  22 22  GLUCOSE 150* 121*  --  109* 110*  BUN 25* 24*  --  18 18  CREATININE 1.20* 1.20*  --  1.13* 1.19*  CALCIUM 8.6* 8.3*  --  8.6* 8.7*  MG  --   --  1.8  --   --  GFR: Estimated Creatinine Clearance: 50.5 mL/min (by C-G formula based on SCr of 1.19 mg/dL). Liver Function Tests:  Recent Labs Lab 12/09/15 0638  AST 19  ALT 12*  ALKPHOS 68  BILITOT 0.5  PROT 6.6  ALBUMIN 3.0*   No results for input(s): LIPASE, AMYLASE in the last 168 hours. No results for input(s): AMMONIA in the last 168 hours. Coagulation Profile:  Recent Labs Lab 12/09/15 0638  INR 1.08   Cardiac Enzymes:  Recent Labs Lab 12/09/15 1333 12/10/15 1225 12/10/15 1712 12/11/15 1012  TROPONINI <0.03 <0.03 <0.03 <0.03   BNP (last 3 results) No results for input(s): PROBNP in the last 8760 hours. HbA1C:  Recent Labs  12/09/15 1333  HGBA1C 6.0*   CBG:  Recent Labs Lab 12/10/15 0444  GLUCAP 135*   Lipid Profile: No results for input(s): CHOL, HDL, LDLCALC, TRIG, CHOLHDL, LDLDIRECT in the last 72 hours. Thyroid Function Tests:  Recent Labs  12/11/15 1012  FREET4 1.04   Anemia Panel:  Recent Labs  12/10/15 0417  VITAMINB12 448   Sepsis Labs: No results for input(s): PROCALCITON, LATICACIDVEN in the last 168 hours.  Recent Results (from the past 240 hour(s))  MRSA PCR Screening     Status: None   Collection Time: 12/10/15  9:29 AM  Result Value Ref Range Status   MRSA by PCR NEGATIVE NEGATIVE Final    Comment:        The GeneXpert MRSA Assay (FDA approved for NASAL specimens only), is one  component of a comprehensive MRSA colonization surveillance program. It is not intended to diagnose MRSA infection nor to guide or monitor treatment for MRSA infections.          Radiology Studies: No results found.      Scheduled Meds: . sodium chloride   Intravenous Once  . sodium chloride   Intravenous Once  . sodium chloride   Intravenous Once  . sodium chloride   Intravenous Once  . atorvastatin  40 mg Oral q1800  . lisinopril  20 mg Oral Daily  . metoprolol succinate  12.5 mg Oral Daily  . omega-3 acid ethyl esters  2 g Oral Daily  . pantoprazole (PROTONIX) IV  40 mg Intravenous Q24H  . polyethylene glycol  17 g Oral BID  . polyethylene glycol-electrolytes  4,000 mL Oral Once  . sodium chloride flush  3 mL Intravenous Q12H   Continuous Infusions: . sodium chloride 10 mL/hr at 12/12/15 0927     LOS: 2 days    Time spent: 76 mins    Murlene Revell, MD [facility] Pager [phone]  If 7PM-7AM, please contact night-coverage [email or web address] Password [facility] 12/12/2015, 11:53 AM

## 2015-12-12 NOTE — Progress Notes (Signed)
Patient Name: Shelly [name] Date of Encounter: 12/12/2015  Hospital Problem List     Principal Problem:   Lower GI bleeding Active Problems:   Essential hypertension   CAD (coronary artery disease)   Rectal bleed   Syncope and collapse   Acute blood loss anemia   Pancolonic diverticulosis   Third degree heart block (HCC)    Subjective   Had a near syncopal episode this morning while on the [facility].  Inpatient Medications    . sodium chloride   Intravenous Once  . sodium chloride   Intravenous Once  . sodium chloride   Intravenous Once  . sodium chloride   Intravenous Once  . atorvastatin  40 mg Oral q1800  . lisinopril  20 mg Oral Daily  . metoprolol succinate  12.5 mg Oral Daily  . omega-3 acid ethyl esters  2 g Oral Daily  . pantoprazole (PROTONIX) IV  40 mg Intravenous Q24H  . polyethylene glycol  17 g Oral BID  . polyethylene glycol-electrolytes  4,000 mL Oral Once  . sodium chloride flush  3 mL Intravenous Q12H    Vital Signs    Vitals:   12/12/15 0355 12/12/15 0409 12/12/15 0432 12/12/15 0712  BP:  (!) 136/55 (!) 110/53 (!) 110/56  Pulse:  72 83 85  Resp:  19 16 13   Temp: 97.6 F (36.4 C) 98 F (36.7 C) 98.1 F (36.7 C) 98.5 F (36.9 C)  TempSrc: Oral Oral Oral Oral  SpO2:  100% 100% 100%  Weight:      Height:        Intake/Output Summary (Last 24 hours) at 12/12/15 0848 Last data filed at 12/12/15 0710  Gross per 24 hour  Intake             1550 ml  Output              650 ml  Net              900 ml   Filed Weights   12/09/15 0629 12/09/15 1102 12/11/15 0500  Weight: 224 lb (101.6 kg) 232 lb 9.6 oz (105.5 kg) 236 lb 9.6 oz (107.3 kg)    Physical Exam    General: Pleasant obese female, NAD. Neuro: Alert and oriented X 3. Moves all extremities spontaneously. Psych: Normal affect. HEENT:  Normal  Neck: Supple without bruits or JVD. Lungs:  Resp regular and unlabored, CTA. Heart: RRR no s3, s4, or murmurs. Abdomen: Soft, non-tender,  non-distended, BS + x 4.  Extremities: No clubbing, cyanosis or edema. DP/PT/Radials 2+ and equal bilaterally.  Labs    CBC  Recent Labs  12/09/15 2316 12/10/15 0417  12/12/15 0006 12/12/15 0400  WBC 8.1 8.7  --   --   --   HGB 8.9* 8.4*  < > 7.8* 7.7*  HCT 27.9* 26.3*  < > 23.6* 23.2*  MCV 94.6 94.6  --   --   --   PLT 215 221  --   --   --   < > = values in this interval not displayed. Basic Metabolic Panel  Recent Labs  12/10/15 1225 12/11/15 1012 12/12/15 0006  NA  --  140 139  K  --  4.5 4.2  CL  --  115* 112*  CO2  --  22 22  GLUCOSE  --  109* 110*  BUN  --  18 18  CREATININE  --  1.13* 1.19*  CALCIUM  --  8.6*  8.7*  MG 1.8  --   --    Liver Function Tests No results for input(s): AST, ALT, ALKPHOS, BILITOT, PROT, ALBUMIN in the last 72 hours. No results for input(s): LIPASE, AMYLASE in the last 72 hours. Cardiac Enzymes  Recent Labs  12/10/15 1225 12/10/15 1712 12/11/15 1012  TROPONINI <0.03 <0.03 <0.03   BNP Invalid input(s): POCBNP D-Dimer No results for input(s): DDIMER in the last 72 hours. Hemoglobin A1C  Recent Labs  12/09/15 1333  HGBA1C 6.0*   Fasting Lipid Panel No results for input(s): CHOL, HDL, LDLCALC, TRIG, CHOLHDL, LDLDIRECT in the last 72 hours. Thyroid Function Tests No results for input(s): TSH, T4TOTAL, T3FREE, THYROIDAB in the last 72 hours.  Invalid input(s): FREET3  Telemetry    SR with come episodes of tachycardia  ECG    SR no pauses/signs of HB  Radiology    TTE: 12/10/2015  Study Conclusions  - Left ventricle: The cavity size was normal. Wall thickness was   increased in a pattern of mild LVH. Systolic function was normal.   The estimated ejection fraction was in the range of 55% to 60%.   Wall motion was normal; there were no regional wall motion   abnormalities. Left ventricular diastolic function parameters   were normal. - Atrial septum: No defect or patent foramen ovale was  identified.  Assessment & Plan    Shelly [name] is a pleasant 75 yo AA female with PMH of CAD s/p overlapping stent to prox and mid RCA in 08/2013, HTN, h/o goiter, and pancolonic diverticulosis presented with GI bleeding, while sitting on the toilet, she had an episode of syncope. While at [facility], she had 2nd syncope with high degree AV block after finishing bowel movement  1. Recurrent syncope: Likely multifactorial, triggers include vasovagal as both episodes occurred after having BM, GI bleed with hypovolemia, high degree AV block 2/2 chronic use of beta blocker. -- BB reduced to 12.5 mg XL daily, no further arrhythmias noted to telemetry -- Did have a near syncopal episode this morning. States she was sitting on the bedside commode when she felt slightly light-headed and her head felt "heavy". Remembers the event. Reviewed tele with no signs of pauses or heat block. Likely again related to vasovagal episodes in combination with active GI bleed.   2. Acute anemia with lower GI bleeding                       - pancolonic diverticulosis. Guaiac stool positive. BRBPR, Hgb remains variable.                        - s/p 2 unit PRBC. Further workup per GI, with plan for colonoscopy tomorrow.   3. CAD s/p overlapping stents to RCA in 08/2013  4. HTN: stable.  5. h/o goiter: TSH borderline low.  Signed, Reino Bellis NP-C Pager [phone]  I have seen and examined the patient along with Reino Bellis NP-C.  I have reviewed the chart, notes and new data.  I agree with NP's note.   PLAN:  This morning's event (presyncope without bradyarrhythmia during BM) confirms that she has neurally-mediated syncope (today predominantly vasodepressor, last time also cardioinhibitory) and that pacemaker therapy is not indicated. Note plans for colonoscopy tomorrow. If she needs major abdominal surgery would risk stratify with a Lexiscan Myoview (although she would have to have a very severe  reversible abnormality for [place] to consider revascularization in  view of ongoing bleeding).  Sanda Klein, MD, [place] [phone] 12/12/2015, 11:05 AM

## 2015-12-12 NOTE — Progress Notes (Signed)
Patient ID: Shelly [name], female   DOB: Dec 03, 1940, 75 y.o.   MRN: [id number] [facility] Progress Note  Shelly [name] 75 y.o. 11-07-1940   Subjective: Denies any rectal bleeding today. Denies abdominal pain.  Objective: Vital signs in last 24 hours: Vitals:   12/12/15 1015 12/12/15 1035  BP: (!) 108/51 117/63  Pulse:  72  Resp:  12  Temp: 98.4 F (36.9 C)     Physical Exam: Gen: alert, elderly, obese, no acute distress CV: RRR Chest: CTA B Abd: soft, nontender, nondistended, +BS  Lab Results:  Recent Labs  12/10/15 1225 12/11/15 1012 12/12/15 0006  NA  --  140 139  K  --  4.5 4.2  CL  --  115* 112*  CO2  --  22 22  GLUCOSE  --  109* 110*  BUN  --  18 18  CREATININE  --  1.13* 1.19*  CALCIUM  --  8.6* 8.7*  MG 1.8  --   --    No results for input(s): AST, ALT, ALKPHOS, BILITOT, PROT, ALBUMIN in the last 72 hours.  Recent Labs  12/09/15 2316 12/10/15 0417  12/12/15 0006 12/12/15 0400  WBC 8.1 8.7  --   --   --   HGB 8.9* 8.4*  < > 7.8* 7.7*  HCT 27.9* 26.3*  < > 23.6* 23.2*  MCV 94.6 94.6  --   --   --   PLT 215 221  --   --   --   < > = values in this interval not displayed. No results for input(s): LABPROT, INR in the last 72 hours.    Assessment/Plan: GI bleed likely diverticular that has persisted but hopefully is resolving now. Due to lack of MAC availability in endoscopy tomorrow the colonoscopy will be postponed until Friday morning 12/14/15. Will give Miralax prep today and GoLytely prep tomorrow since she likely will need a 2 day prep anyhow to adequately prep her colon with the bleeding she has been having. Continue clear liquid diet. Supportive care.   [place] [facility]. 12/12/2015, 12:12 PM  Pager [phone]  If no answer or after 5 PM call [phone]

## 2015-12-13 ENCOUNTER — Other Ambulatory Visit: Payer: Self-pay | Admitting: Cardiology

## 2015-12-13 DIAGNOSIS — R55 Syncope and collapse: Secondary | ICD-10-CM

## 2015-12-13 LAB — TYPE AND SCREEN
ABO/RH(D): O POS
ABO/RH(D): O POS
Antibody Screen: NEGATIVE
Antibody Screen: NEGATIVE
UNIT DIVISION: 0
Unit division: 0
Unit division: 0
Unit division: 0
Unit division: 0
Unit division: 0

## 2015-12-13 LAB — BASIC METABOLIC PANEL
Anion gap: 4 — ABNORMAL LOW (ref 5–15)
BUN: 14 mg/dL (ref 6–20)
CO2: 23 mmol/L (ref 22–32)
Calcium: 8.6 mg/dL — ABNORMAL LOW (ref 8.9–10.3)
Chloride: 111 mmol/L (ref 101–111)
Creatinine, Ser: 1.32 mg/dL — ABNORMAL HIGH (ref 0.44–1.00)
GFR calc Af Amer: 45 mL/min — ABNORMAL LOW (ref >60)
GFR, EST NON AFRICAN AMERICAN: 39 mL/min — AB (ref >60)
GLUCOSE: 119 mg/dL — AB (ref 65–99)
POTASSIUM: 4.4 mmol/L (ref 3.5–5.1)
Sodium: 138 mmol/L (ref 135–145)

## 2015-12-13 LAB — CBC
HEMATOCRIT: 26.7 % — AB (ref 36.0–46.0)
Hemoglobin: 8.9 g/dL — ABNORMAL LOW (ref 12.0–15.0)
MCH: 30 pg (ref 26.0–34.0)
MCHC: 33.3 g/dL (ref 30.0–36.0)
MCV: 89.9 fL (ref 78.0–100.0)
PLATELETS: 201 10*3/uL (ref 150–400)
RBC: 2.97 MIL/uL — AB (ref 3.87–5.11)
RDW: 15.7 % — AB (ref 11.5–15.5)
WBC: 8.7 10*3/uL (ref 4.0–10.5)

## 2015-12-13 LAB — MAGNESIUM: Magnesium: 1.9 mg/dL (ref 1.7–2.4)

## 2015-12-13 MED ORDER — PANTOPRAZOLE SODIUM 40 MG IV SOLR
40.0000 mg | Freq: Two times a day (BID) | INTRAVENOUS | Status: DC
  Administered 2015-12-13 (×2): 40 mg via INTRAVENOUS
  Filled 2015-12-13 (×2): qty 40

## 2015-12-13 NOTE — Progress Notes (Signed)
Patient Name: Shelly [name] Date of Encounter: 12/13/2015  Hospital Problem List     Principal Problem:   Lower GI bleeding Active Problems:   Essential hypertension   CAD (coronary artery disease)   Rectal bleed   Syncope and collapse   Acute blood loss anemia   Pancolonic diverticulosis   Third degree heart block (HCC)    Subjective   Feeling well this morning. No further episodes of pre-syncope/syncope.   Inpatient Medications    . sodium chloride   Intravenous Once  . sodium chloride   Intravenous Once  . sodium chloride   Intravenous Once  . sodium chloride   Intravenous Once  . atorvastatin  40 mg Oral q1800  . lisinopril  20 mg Oral Daily  . metoprolol succinate  12.5 mg Oral Daily  . omega-3 acid ethyl esters  2 g Oral Daily  . pantoprazole (PROTONIX) IV  40 mg Intravenous Q24H  . polyethylene glycol-electrolytes  4,000 mL Oral Once  . sodium chloride flush  3 mL Intravenous Q12H    Vital Signs    Vitals:   12/12/15 2043 12/12/15 2354 12/13/15 0445 12/13/15 0728  BP: (!) 119/54 (!) 122/51 (!) 129/59   Pulse: 73 65 100   Resp: (!) 25 19 (!) 23   Temp: 98.4 F (36.9 C) 98.1 F (36.7 C) 98.4 F (36.9 C) 98.6 F (37 C)  TempSrc: Oral Oral Oral Oral  SpO2: 100% 100% 99%   Weight:   229 lb 6.4 oz (104.1 kg)   Height:        Intake/Output Summary (Last 24 hours) at 12/13/15 0848 Last data filed at 12/13/15 0233  Gross per 24 hour  Intake             1055 ml  Output             1600 ml  Net             -545 ml   Filed Weights   12/09/15 1102 12/11/15 0500 12/13/15 0445  Weight: 232 lb 9.6 oz (105.5 kg) 236 lb 9.6 oz (107.3 kg) 229 lb 6.4 oz (104.1 kg)    Physical Exam    General: Pleasant obese female, NAD. Neuro: Alert and oriented X 3. Moves all extremities spontaneously. Psych: Normal affect. HEENT:  Normal  Neck: Supple without bruits or JVD. Lungs:  Resp regular and unlabored, CTA. Heart: RRR no s3, s4, or murmurs. Abdomen: Soft,  non-tender, non-distended, BS + x 4.  Extremities: No clubbing, cyanosis or edema. DP/PT/Radials 2+ and equal bilaterally.  Labs    CBC  Recent Labs  12/12/15 1338 12/13/15 0259  WBC  --  8.7  HGB 9.0* 8.9*  HCT 27.6* 26.7*  MCV  --  89.9  PLT  --  123456   Basic Metabolic Panel  Recent Labs  12/10/15 1225  12/12/15 0006 12/13/15 0259  NA  --   < > 139 138  K  --   < > 4.2 4.4  CL  --   < > 112* 111  CO2  --   < > 22 23  GLUCOSE  --   < > 110* 119*  BUN  --   < > 18 14  CREATININE  --   < > 1.19* 1.32*  CALCIUM  --   < > 8.7* 8.6*  MG 1.8  --   --  1.9  < > = values in this interval not displayed. Liver  Function Tests No results for input(s): AST, ALT, ALKPHOS, BILITOT, PROT, ALBUMIN in the last 72 hours. No results for input(s): LIPASE, AMYLASE in the last 72 hours. Cardiac Enzymes  Recent Labs  12/10/15 1225 12/10/15 1712 12/11/15 1012  TROPONINI <0.03 <0.03 <0.03   BNP Invalid input(s): POCBNP D-Dimer No results for input(s): DDIMER in the last 72 hours. Hemoglobin A1C No results for input(s): HGBA1C in the last 72 hours. Fasting Lipid Panel No results for input(s): CHOL, HDL, LDLCALC, TRIG, CHOLHDL, LDLDIRECT in the last 72 hours. Thyroid Function Tests No results for input(s): TSH, T4TOTAL, T3FREE, THYROIDAB in the last 72 hours.  Invalid input(s): FREET3  Telemetry    SR with come episodes of tachycardia  ECG    None this morning.  Radiology    TTE: 12/10/2015  Study Conclusions  - Left ventricle: The cavity size was normal. Wall thickness was   increased in a pattern of mild LVH. Systolic function was normal.   The estimated ejection fraction was in the range of 55% to 60%.   Wall motion was normal; there were no regional wall motion   abnormalities. Left ventricular diastolic function parameters   were normal. - Atrial septum: No defect or patent foramen ovale was identified.  Assessment & Plan    Mrs. Shelly [name] is a pleasant 75  yo AA female with PMH of CAD s/p overlapping stent to prox and mid RCA in 08/2013, HTN, h/o goiter, and pancolonic diverticulosis presented with GI bleeding, while sitting on the toilet, she had an episode of syncope. While at [facility], she had 2nd syncope with high degree AV block after finishing bowel movement. She had near syncope without arrhythmia during another bowel movement a couple of days ago.  1. Recurrent syncope: No further episodes of pre-syncope/syncope. -- BB was reduced to 12.5 mg XL daily, no further arrhythmias noted to telemetry. bp stable with this change -- Will set up for 30 day event monitor, and follow up. If she needs major abdominal surgery would risk stratify with a Lexiscan Myoview (although she would have to have a very severe reversible abnormality for [place] to consider revascularization in view of ongoing bleeding).  2. Acute anemia with lower GI bleeding                       - pancolonic diverticulosis. Guaiac stool positive. BRBPR, Hgb remains variable.                        - s/p total of 5 units PRBC. Further workup per GI, with plan for colonoscopy tomorrow.   3. CAD s/p overlapping stents to RCA in 08/2013  4. HTN: stable.  5. h/o goiter: TSH borderline low.  Signed, Reino Bellis NP-C Pager [phone]  I have seen and examined the patient along with Reino Bellis NP-C.  I have reviewed the chart, notes and new data.  I agree with NP's note.  Key new complaints: no angina, no preseyncope Key examination changes: no overt CHF; no arrhythmia on monitor Key new findings / data: Hgb 9  PLAN: No further inpatient workup planned, unless a decision is made for major abdominal surgery. OUtpatient monitor scheduled. Please re-consult if needed.  Sanda Klein, MD, [place] [phone] 12/13/2015, 10:15 AM

## 2015-12-13 NOTE — Progress Notes (Signed)
Patient ID: Shelly [name], female   DOB: May 27, 1940, 75 y.o.   MRN: [id number] [facility] Gastroenterology Progress Note  Shelly [name] 75 y.o. 1940-11-15   Subjective: Reports rectal bleeding has decreased. Denies abdominal pain. Sitting in bedside chair.  Objective: Vital signs: Vitals:   12/13/15 0728 12/13/15 1026  BP:  122/62  Pulse:  82  Resp:    Temp: 98.6 F (37 C)     Physical Exam: Gen: alert, no acute distress, elderly Abd: soft, nontender, nondistended, +BS  Lab Results:  Recent Labs  12/10/15 1225  12/12/15 0006 12/13/15 0259  NA  --   < > 139 138  K  --   < > 4.2 4.4  CL  --   < > 112* 111  CO2  --   < > 22 23  GLUCOSE  --   < > 110* 119*  BUN  --   < > 18 14  CREATININE  --   < > 1.19* 1.32*  CALCIUM  --   < > 8.7* 8.6*  MG 1.8  --   --  1.9  < > = values in this interval not displayed. No results for input(s): AST, ALT, ALKPHOS, BILITOT, PROT, ALBUMIN in the last 72 hours.  Recent Labs  12/12/15 1338 12/13/15 0259  WBC  --  8.7  HGB 9.0* 8.9*  HCT 27.6* 26.7*  MCV  --  89.9  PLT  --  201      Assessment/Plan: Lower GI bleed -  Likely diverticular. Hgb stable. Continue colon prep and plan for colonoscopy tomorrow. Supportive care.    Clayton C. 12/13/2015, 12:00 PM  Pager [phone]  If no answer or after 5 PM call [phone]

## 2015-12-13 NOTE — Progress Notes (Signed)
PROGRESS NOTE    Shelly [name]  [id number] DOB: 06/24/1940 DOA: 12/09/2015 PCP: [name], MD   Brief Narrative:  Patient is a 75 year old woman with a history of rectal bleeding secondary to pandiverticulosis/diverticular bleed in August 2015, CAD status post non-ST elevation MI with overlapping stents 08/2013, and HTN, who was admitted on 12/09/2015 for large volume rectal bleeding and syncopal episode. In the ED, she was afebrile and hemodynamically stable with a blood pressure in the [id number] systolically. Her lab data were significant for hemoglobin of 9.7, BUN of 25, creatinine of 1.20, glucose of 150, INR 1.08, and PTT of 25. She was admitted for further workup. Her hemoglobin decreased from 9.7-8.4. She was given 1 unit of packed red blood cell transfusion with appropriate increase in her hemoglobin. Early this morning, she had another large volume bloody stool and syncopal episode. Apparently her to telemetry strip showed a third-degree heart block. Dr. Marin Comment discussed the patient with cardiology at [facility] recommended transfer to SDU forfurther evaluation.    Assessment & Plan:   Principal Problem:   Lower GI bleeding Active Problems:   Essential hypertension   CAD (coronary artery disease)   Rectal bleed   Syncope and collapse   Acute blood loss anemia   Pancolonic diverticulosis   Third degree heart block (HCC)  #1 lower GI bleed Likely secondary to an acute diverticular bleed as patient does have a history of pandiverticulosis. Patient had prior lower GI bleed August 2015 and underwent colonoscopy which showed pandiverticulosis and transfusion as needed was recommended. Patient had presented to [facility] 12/09/2015 with rectal bleeding and a syncopal episode at that time hemoglobin was 9.7. GI, Dr. Wonda Amis saw the patient in consultation at [facility] and recommended diagnostic laparoscopy 12/10/2015 however due to cardiac reasons/bradycardia with arrhythmia  patient was transferred to [facility]. Patient was transfused 1 unit packed red blood cells are [facility], and has received a total of 5 units of packed red blood cells. Increase PPI to every 12 hours. Patient has been seen in consultation by GI and patient is being prepped for colonoscopy to be done on Friday, 12/14/2015. Follow H&H. Per GI.  #2 syncopal episode with bradycardia and beta blocker induced AV block Prior to admission patient noted to have a syncopal episode at home and 2 episodes in the hospital. Patient had another episode the night of 12/11/15 while on the bedside commode. CT head negative. No further syncopal episodes. Etiology of syncopal episode multifactorial secondary to vasovagal as well as cardioinhibitory. Patient noted to have third-degree AV block on telemetry and subsequently transferred to [facility]. Patient has been seen in consultation by cardiology who had decrease patient's beta blocker and recommended a 30 day event monitor discharged outpatient stress test. Stress test may be done inpatient only if patient is plan for major surgery. 2-D echo with EF of 55-60%. Patient with no further arrhythmias on current dose of beta blocker. Cardiology following.  #3 acute blood loss anemia Secondary to problem #1. Status post 5 units packed red blood cells. Hemoglobin at 8.9 from 9.0 from 7.7 yesterday. Follow H&H.  #4 hypertension Stable.  #5 hyperlipidemia Check FLP. Continue statin.  #6 coronary artery disease Patient denies any chest pain or shortness of breath. Cardiac enzymes negative. 2-D echo EF 55-60% with no wall motion abnormalities. Aspirin on hold. Continue statin.    DVT prophylaxis: SCDs Code Status: Full Family Communication: Updated patient and family at bedside. Disposition Plan: Home once bleed  has resolved and per GI.   Consultants:   Cardiology: Dr.Croitoru 12/10/2015  Gastroenterology: Dr. Michail Sermon 12/10/2015  Procedures:    Ct head 12/09/2015  Echo 12/10/2015  5 units PRBC 12/12/2015   Antimicrobials:  None   Subjective: Patient states had bloody bowel movement overnight and early this morning with prep. No chest pain. No shortness of breath.No further syncopal episodes.  Objective: Vitals:   12/12/15 2043 12/12/15 2354 12/13/15 0445 12/13/15 0728  BP: (!) 119/54 (!) 122/51 (!) 129/59   Pulse: 73 65 100   Resp: (!) 25 19 (!) 23   Temp: 98.4 F (36.9 C) 98.1 F (36.7 C) 98.4 F (36.9 C) 98.6 F (37 C)  TempSrc: Oral Oral Oral Oral  SpO2: 100% 100% 99%   Weight:   104.1 kg (229 lb 6.4 oz)   Height:        Intake/Output Summary (Last 24 hours) at 12/13/15 1025 Last data filed at 12/13/15 0910  Gross per 24 hour  Intake              815 ml  Output             1600 ml  Net             -785 ml   Filed Weights   12/09/15 1102 12/11/15 0500 12/13/15 0445  Weight: 105.5 kg (232 lb 9.6 oz) 107.3 kg (236 lb 9.6 oz) 104.1 kg (229 lb 6.4 oz)    Examination:  General exam: Appears calm and comfortable  Respiratory system: Clear to auscultation on anterior lung fields. Respiratory effort normal. Cardiovascular system: S1 & S2 heard, RRR. No JVD, murmurs, rubs, gallops or clicks. No pedal edema. Gastrointestinal system: Abdomen is nondistended, soft and nontender. No organomegaly or masses felt. Normal bowel sounds heard. Central nervous system: Alert and oriented. No focal neurological deficits. Extremities: Symmetric 5 x 5 power. Skin: No rashes, lesions or ulcers Psychiatry: Judgement and insight appear normal. Mood & affect appropriate.     Data Reviewed: I have personally reviewed following labs and imaging studies  CBC:  Recent Labs Lab 12/09/15 0638 12/09/15 1355 12/09/15 2316 12/10/15 0417  12/11/15 1805 12/12/15 0006 12/12/15 0400 12/12/15 1338 12/13/15 0259  WBC 6.5 6.0 8.1 8.7  --   --   --   --   --  8.7  NEUTROABS 4.1  --   --   --   --   --   --   --   --   --    HGB 9.7* 8.4* 8.9* 8.4*  < > 8.1* 7.8* 7.7* 9.0* 8.9*  HCT 30.0* 26.4* 27.9* 26.3*  < > 24.3* 23.6* 23.2* 27.6* 26.7*  MCV 96.5 96.7 94.6 94.6  --   --   --   --   --  89.9  PLT 255 246 215 221  --   --   --   --   --  201  < > = values in this interval not displayed. Basic Metabolic Panel:  Recent Labs Lab 12/09/15 0638 12/10/15 0417 12/10/15 1225 12/11/15 1012 12/12/15 0006 12/13/15 0259  NA 139 141  --  140 139 138  K 4.1 4.4  --  4.5 4.2 4.4  CL 114* 117*  --  115* 112* 111  CO2 21* 21*  --  22 22 23   GLUCOSE 150* 121*  --  109* 110* 119*  BUN 25* 24*  --  18 18 14   CREATININE 1.20* 1.20*  --  1.13* 1.19* 1.32*  CALCIUM 8.6* 8.3*  --  8.6* 8.7* 8.6*  MG  --   --  1.8  --   --  1.9   GFR: Estimated Creatinine Clearance: 44.7 mL/min (by C-G formula based on SCr of 1.32 mg/dL). Liver Function Tests:  Recent Labs Lab 12/09/15 0638  AST 19  ALT 12*  ALKPHOS 68  BILITOT 0.5  PROT 6.6  ALBUMIN 3.0*   No results for input(s): LIPASE, AMYLASE in the last 168 hours. No results for input(s): AMMONIA in the last 168 hours. Coagulation Profile:  Recent Labs Lab 12/09/15 0638  INR 1.08   Cardiac Enzymes:  Recent Labs Lab 12/09/15 1333 12/10/15 1225 12/10/15 1712 12/11/15 1012  TROPONINI <0.03 <0.03 <0.03 <0.03   BNP (last 3 results) No results for input(s): PROBNP in the last 8760 hours. HbA1C: No results for input(s): HGBA1C in the last 72 hours. CBG:  Recent Labs Lab 12/10/15 0444  GLUCAP 135*   Lipid Profile: No results for input(s): CHOL, HDL, LDLCALC, TRIG, CHOLHDL, LDLDIRECT in the last 72 hours. Thyroid Function Tests:  Recent Labs  12/11/15 1012  FREET4 1.04   Anemia Panel: No results for input(s): VITAMINB12, FOLATE, FERRITIN, TIBC, IRON, RETICCTPCT in the last 72 hours. Sepsis Labs: No results for input(s): PROCALCITON, LATICACIDVEN in the last 168 hours.  Recent Results (from the past 240 hour(s))  MRSA PCR Screening     Status:  None   Collection Time: 12/10/15  9:29 AM  Result Value Ref Range Status   MRSA by PCR NEGATIVE NEGATIVE Final    Comment:        The GeneXpert MRSA Assay (FDA approved for NASAL specimens only), is one component of a comprehensive MRSA colonization surveillance program. It is not intended to diagnose MRSA infection nor to guide or monitor treatment for MRSA infections.          Radiology Studies: No results found.      Scheduled Meds: . sodium chloride   Intravenous Once  . sodium chloride   Intravenous Once  . sodium chloride   Intravenous Once  . sodium chloride   Intravenous Once  . atorvastatin  40 mg Oral q1800  . lisinopril  20 mg Oral Daily  . metoprolol succinate  12.5 mg Oral Daily  . omega-3 acid ethyl esters  2 g Oral Daily  . pantoprazole (PROTONIX) IV  40 mg Intravenous Q12H  . polyethylene glycol-electrolytes  4,000 mL Oral Once  . sodium chloride flush  3 mL Intravenous Q12H   Continuous Infusions: . sodium chloride 10 mL/hr at 12/12/15 0927     LOS: 3 days    Time spent: 36 mins    THOMPSON,DANIEL, MD [facility] Pager [phone]  If 7PM-7AM, please contact night-coverage [email or web address] Password [facility] 12/13/2015, 10:25 AM

## 2015-12-13 NOTE — Care Management Important Message (Signed)
Important Message  Patient Details  Name: Shelly [name] MRN: [id number] Date of Birth: 31-Jan-1941   Medicare Important Message Given:  Yes    Nathen May 12/13/2015, 12:10 PM

## 2015-12-14 ENCOUNTER — Inpatient Hospital Stay (HOSPITAL_COMMUNITY): Payer: Medicare Other | Admitting: Certified Registered"

## 2015-12-14 ENCOUNTER — Encounter (HOSPITAL_COMMUNITY)
Admission: EM | Disposition: A | Discharge status: Home / Self Care | Payer: Self-pay | Source: Home / Self Care | Attending: Internal Medicine

## 2015-12-14 ENCOUNTER — Encounter (HOSPITAL_COMMUNITY): Payer: Self-pay

## 2015-12-14 DIAGNOSIS — K5730 Diverticulosis of large intestine without perforation or abscess without bleeding: Secondary | ICD-10-CM

## 2015-12-14 HISTORY — PX: COLONOSCOPY: SHX5424

## 2015-12-14 HISTORY — PX: ESOPHAGOGASTRODUODENOSCOPY: SHX5428

## 2015-12-14 LAB — CBC
HCT: 25 % — ABNORMAL LOW (ref 36.0–46.0)
Hemoglobin: 8.2 g/dL — ABNORMAL LOW (ref 12.0–15.0)
MCH: 30.1 pg (ref 26.0–34.0)
MCHC: 32.8 g/dL (ref 30.0–36.0)
MCV: 91.9 fL (ref 78.0–100.0)
PLATELETS: 218 10*3/uL (ref 150–400)
RBC: 2.72 MIL/uL — AB (ref 3.87–5.11)
RDW: 15.6 % — AB (ref 11.5–15.5)
WBC: 8 10*3/uL (ref 4.0–10.5)

## 2015-12-14 LAB — LIPID PANEL
CHOL/HDL RATIO: 3.1 ratio
Cholesterol: 97 mg/dL (ref 0–200)
HDL: 31 mg/dL — AB (ref >40)
LDL CALC: 51 mg/dL (ref 0–99)
Triglycerides: 77 mg/dL (ref <150)
VLDL: 15 mg/dL (ref 0–40)

## 2015-12-14 LAB — BASIC METABOLIC PANEL
ANION GAP: 6 (ref 5–15)
BUN: 10 mg/dL (ref 6–20)
CALCIUM: 8.8 mg/dL — AB (ref 8.9–10.3)
CO2: 24 mmol/L (ref 22–32)
Chloride: 111 mmol/L (ref 101–111)
Creatinine, Ser: 1.19 mg/dL — ABNORMAL HIGH (ref 0.44–1.00)
GFR calc non Af Amer: 44 mL/min — ABNORMAL LOW (ref >60)
GFR, EST AFRICAN AMERICAN: 51 mL/min — AB (ref >60)
GLUCOSE: 110 mg/dL — AB (ref 65–99)
POTASSIUM: 4.2 mmol/L (ref 3.5–5.1)
SODIUM: 141 mmol/L (ref 135–145)

## 2015-12-14 LAB — MAGNESIUM: Magnesium: 2 mg/dL (ref 1.7–2.4)

## 2015-12-14 SURGERY — COLONOSCOPY
Anesthesia: Monitor Anesthesia Care

## 2015-12-14 MED ORDER — PANTOPRAZOLE SODIUM 40 MG PO TBEC
40.0000 mg | DELAYED_RELEASE_TABLET | Freq: Every day | ORAL | Status: DC
  Administered 2015-12-14 – 2015-12-16 (×3): 40 mg via ORAL
  Filled 2015-12-14 (×3): qty 1

## 2015-12-14 MED ORDER — LIDOCAINE HCL (CARDIAC) 20 MG/ML IV SOLN
INTRAVENOUS | Status: DC | PRN
  Administered 2015-12-14: 50 mg via INTRAVENOUS

## 2015-12-14 MED ORDER — PROPOFOL 500 MG/50ML IV EMUL
INTRAVENOUS | Status: DC | PRN
  Administered 2015-12-14: 50 ug/kg/min via INTRAVENOUS

## 2015-12-14 MED ORDER — PHENYLEPHRINE HCL 10 MG/ML IJ SOLN
INTRAMUSCULAR | Status: DC | PRN
  Administered 2015-12-14 (×2): 80 ug via INTRAVENOUS

## 2015-12-14 MED ORDER — LACTATED RINGERS IV SOLN
INTRAVENOUS | Status: DC | PRN
  Administered 2015-12-14: 07:00:00 via INTRAVENOUS

## 2015-12-14 NOTE — Progress Notes (Signed)
PROGRESS NOTE    BASSY EAKINS  [id number] DOB: [birth year] DOA: 12/09/2015 PCP: [name], MD   Brief Narrative:  Patient is a 75 year old woman with a history of rectal bleeding secondary to pandiverticulosis/diverticular bleed in August 2015, CAD status post non-ST elevation MI with overlapping stents 08/2013, and HTN, who was admitted on 12/09/2015 for large volume rectal bleeding and syncopal episode. In the ED, she was afebrile and hemodynamically stable with a blood pressure in the [id number] systolically. Her lab data were significant for hemoglobin of 9.7, BUN of 25, creatinine of 1.20, glucose of 150, INR 1.08, and PTT of 25. She was admitted for further workup. Her hemoglobin decreased from 9.7-8.4. She was given 1 unit of packed red blood cell transfusion with appropriate increase in her hemoglobin. Early this morning, she had another large volume bloody stool and syncopal episode. Apparently her to telemetry strip showed a third-degree heart block. Dr. Marin Comment discussed the patient with cardiology at [facility] recommended transfer to SDU forfurther evaluation.    Assessment & Plan:   Principal Problem:   Lower GI bleeding Active Problems:   Essential hypertension   CAD (coronary artery disease)   Rectal bleed   Syncope and collapse   Acute blood loss anemia   Pancolonic diverticulosis   Third degree heart block (HCC)  #1 lower GI bleed Likely secondary to an acute diverticular bleed as patient does have a history of pandiverticulosis. Patient had prior lower GI bleed August 2015 and underwent colonoscopy which showed pandiverticulosis and transfusion as needed was recommended. Patient had presented to [facility] 12/09/2015 with rectal bleeding and a syncopal episode at that time hemoglobin was 9.7. GI, Dr. Wonda Amis saw the patient in consultation at [facility] and recommended diagnostic laparoscopy 12/10/2015 however due to cardiac reasons/bradycardia with arrhythmia  patient was transferred to [facility]. Patient was transfused 1 unit packed red blood cells are [facility], and has received a total of 5 units of packed red blood cells. Increased PPI to every 12 hours. Patient has been seen in consultation by GI and patient underwent upper endoscopy this morning which was unremarkable and the colonoscopy which showed multiple small and large mouth diverticula with no active bleeding. Red blood noted in the rectosigmoid colon, sigmoid colon, descending colon, transverse colon. IV PPI changed to Protonix 40 mg daily and patient's diet has been advanced to a soft diet. Follow H&H. GI following and appreciate input and recommendations.   #2 syncopal episode with bradycardia and beta blocker induced AV block Prior to admission patient noted to have a syncopal episode at home and 2 episodes in the hospital. Patient had another episode the night of 12/11/15 while on the bedside commode. CT head negative. No further syncopal episodes. Etiology of syncopal episode multifactorial secondary to vasovagal as well as cardioinhibitory. Patient noted to have third-degree AV block on telemetry and subsequently transferred to [facility]. Patient has been seen in consultation by cardiology who had decrease patient's beta blocker and recommended a 30 day event monitor discharged outpatient stress test. Stress test may be done inpatient only if patient is plan for major surgery. 2-D echo with EF of 55-60%. Patient with no further arrhythmias on current dose of beta blocker. Cardiology following.  #3 acute blood loss anemia Secondary to problem #1. Status post 5 units packed red blood cells. Hemoglobin at 8.2 from 8.9 from 9.0 from 7.7 yesterday. Follow H&H.  #4 hypertension Stable.  #5 hyperlipidemia FLP with LDL of 51. Continue  statin.  #6 coronary artery disease Patient denies any chest pain or shortness of breath. Cardiac enzymes negative. 2-D echo EF 55-60% with no wall  motion abnormalities. Aspirin on hold. Continue statin.    DVT prophylaxis: SCDs Code Status: Full Family Communication: Updated patient and family at bedside. Disposition Plan: Home once bleed has resolved and per GI.   Consultants:   Cardiology: Dr.Croitoru 12/10/2015  Gastroenterology: Dr. Michail Sermon 12/10/2015  Procedures:   Ct head 12/09/2015  Echo 12/10/2015  5 units PRBC 12/12/2015 EGD: Dr Michail Sermon 12/14/2015 Colonoscopy : Dr Michail Sermon 12/14/2015   Antimicrobials:  None   Subjective: Patient states bloody bowel movement have improved and no further bloody bowel movements overnight during the rest of her prep. No chest pain. No shortness of breath. No further syncopal episodes.  Objective: Vitals:   12/14/15 0754 12/14/15 0931 12/14/15 0940 12/14/15 0950  BP: (!) 118/92 (!) 135/32 (!) 106/32 (!) 116/29  Pulse: 74 78 76 73  Resp: 14 11 (!) 25 20  Temp: 97.9 F (36.6 C) 97.7 F (36.5 C)    TempSrc: Oral Oral    SpO2: 100% 100% 98% 98%  Weight:      Height:        Intake/Output Summary (Last 24 hours) at 12/14/15 1023 Last data filed at 12/14/15 0915  Gross per 24 hour  Intake             2603 ml  Output             1100 ml  Net             1503 ml   Filed Weights   12/11/15 0500 12/13/15 0445 12/14/15 0422  Weight: 107.3 kg (236 lb 9.6 oz) 104.1 kg (229 lb 6.4 oz) 103.8 kg (228 lb 14.4 oz)    Examination:  General exam: Appears calm and comfortable  Respiratory system: Clear to auscultation on anterior lung fields. Respiratory effort normal. Cardiovascular system: S1 & S2 heard, RRR. No JVD, murmurs, rubs, gallops or clicks. No pedal edema. Gastrointestinal system: Abdomen is nondistended, soft and nontender. No organomegaly or masses felt. Normal bowel sounds heard. Central nervous system: Alert and oriented. No focal neurological deficits. Extremities: Symmetric 5 x 5 power. Skin: No rashes, lesions or ulcers Psychiatry: Judgement and insight  appear normal. Mood & affect appropriate.     Data Reviewed: I have personally reviewed following labs and imaging studies  CBC:  Recent Labs Lab 12/09/15 0638 12/09/15 1355 12/09/15 2316 12/10/15 0417  12/12/15 0006 12/12/15 0400 12/12/15 1338 12/13/15 0259 12/14/15 0438  WBC 6.5 6.0 8.1 8.7  --   --   --   --  8.7 8.0  NEUTROABS 4.1  --   --   --   --   --   --   --   --   --   HGB 9.7* 8.4* 8.9* 8.4*  < > 7.8* 7.7* 9.0* 8.9* 8.2*  HCT 30.0* 26.4* 27.9* 26.3*  < > 23.6* 23.2* 27.6* 26.7* 25.0*  MCV 96.5 96.7 94.6 94.6  --   --   --   --  89.9 91.9  PLT 255 246 215 221  --   --   --   --  201 218  < > = values in this interval not displayed. Basic Metabolic Panel:  Recent Labs Lab 12/10/15 0417 12/10/15 1225 12/11/15 1012 12/12/15 0006 12/13/15 0259 12/14/15 0438  NA 141  --  140 139 138 141  K  4.4  --  4.5 4.2 4.4 4.2  CL 117*  --  115* 112* 111 111  CO2 21*  --  22 22 23 24   GLUCOSE 121*  --  109* 110* 119* 110*  BUN 24*  --  18 18 14 10   CREATININE 1.20*  --  1.13* 1.19* 1.32* 1.19*  CALCIUM 8.3*  --  8.6* 8.7* 8.6* 8.8*  MG  --  1.8  --   --  1.9 2.0   GFR: Estimated Creatinine Clearance: 49.6 mL/min (by C-G formula based on SCr of 1.19 mg/dL). Liver Function Tests:  Recent Labs Lab 12/09/15 0638  AST 19  ALT 12*  ALKPHOS 68  BILITOT 0.5  PROT 6.6  ALBUMIN 3.0*   No results for input(s): LIPASE, AMYLASE in the last 168 hours. No results for input(s): AMMONIA in the last 168 hours. Coagulation Profile:  Recent Labs Lab 12/09/15 0638  INR 1.08   Cardiac Enzymes:  Recent Labs Lab 12/09/15 1333 12/10/15 1225 12/10/15 1712 12/11/15 1012  TROPONINI <0.03 <0.03 <0.03 <0.03   BNP (last 3 results) No results for input(s): PROBNP in the last 8760 hours. HbA1C: No results for input(s): HGBA1C in the last 72 hours. CBG:  Recent Labs Lab 12/10/15 0444  GLUCAP 135*   Lipid Profile:  Recent Labs  12/14/15 0438  CHOL 97  HDL 31*    LDLCALC 51  TRIG 77  CHOLHDL 3.1   Thyroid Function Tests: No results for input(s): TSH, T4TOTAL, FREET4, T3FREE, THYROIDAB in the last 72 hours. Anemia Panel: No results for input(s): VITAMINB12, FOLATE, FERRITIN, TIBC, IRON, RETICCTPCT in the last 72 hours. Sepsis Labs: No results for input(s): PROCALCITON, LATICACIDVEN in the last 168 hours.  Recent Results (from the past 240 hour(s))  MRSA PCR Screening     Status: None   Collection Time: 12/10/15  9:29 AM  Result Value Ref Range Status   MRSA by PCR NEGATIVE NEGATIVE Final    Comment:        The GeneXpert MRSA Assay (FDA approved for NASAL specimens only), is one component of a comprehensive MRSA colonization surveillance program. It is not intended to diagnose MRSA infection nor to guide or monitor treatment for MRSA infections.          Radiology Studies: No results found.      Scheduled Meds: . sodium chloride   Intravenous Once  . sodium chloride   Intravenous Once  . sodium chloride   Intravenous Once  . sodium chloride   Intravenous Once  . atorvastatin  40 mg Oral q1800  . lisinopril  20 mg Oral Daily  . metoprolol succinate  12.5 mg Oral Daily  . omega-3 acid ethyl esters  2 g Oral Daily  . pantoprazole  40 mg Oral Daily  . sodium chloride flush  3 mL Intravenous Q12H   Continuous Infusions: . sodium chloride 10 mL/hr at 12/12/15 0927     LOS: 4 days    Time spent: 26 mins    Tyge Somers, MD [facility] Pager [phone]  If 7PM-7AM, please contact night-coverage [email or web address] Password [facility] 12/14/2015, 10:23 AM

## 2015-12-14 NOTE — Op Note (Signed)
[facility] Patient Name: Shelly [name] Procedure Date : 12/14/2015 MRN: [id number] Attending MD: Lear Ng , MD Date of Birth: 1940-10-20 CSN: [id number] Age: 75 Admit Type: Inpatient Procedure:                Upper GI endoscopy Indications:              Active gastrointestinal bleeding, Acute post                            hemorrhagic anemia, Hematochezia Providers:                Lear Ng, MD, Malka So, RN,                            William Dalton, Technician Referring MD:              Medicines:                Propofol per Anesthesia, Monitored Anesthesia Care Complications:            No immediate complications. Estimated Blood Loss:     Estimated blood loss: none. Procedure:                Pre-Anesthesia Assessment:                           - Prior to the procedure, a History and Physical                            was performed, and patient medications and                            allergies were reviewed. The patient's tolerance of                            previous anesthesia was also reviewed. The risks                            and benefits of the procedure and the sedation                            options and risks were discussed with the patient.                            All questions were answered, and informed consent                            was obtained. Prior Anticoagulants: The patient has                            taken no previous anticoagulant or antiplatelet                            agents. ASA Grade Assessment: III - A patient with  severe systemic disease. After reviewing the risks                            and benefits, the patient was deemed in                            satisfactory condition to undergo the procedure.                           After obtaining informed consent, the endoscope was                            passed under direct vision. Throughout the                      procedure, the patient's blood pressure, pulse, and                            oxygen saturations were monitored continuously. The                            [id number] scope was introduced through the                            mouth, and advanced to the second part of duodenum.                            The upper GI endoscopy was accomplished without                            difficulty. The patient tolerated the procedure                            well. Scope In: Scope Out: Findings:      The examined esophagus was normal.      The Z-line was regular and was found 36 cm from the incisors.      The entire examined stomach was normal.      The cardia and gastric fundus were normal on retroflexion.      The examined duodenum was normal. Impression:               - Normal esophagus.                           - Z-line regular, 36 cm from the incisors.                           - Normal stomach.                           - Normal examined duodenum.                           - No specimens collected. Moderate Sedation:      N/A - MAC procedure Recommendation:           - Advance diet as  tolerated and soft diet.                           - Use Protonix (pantoprazole) 40 mg PO daily.                           - Post procedure medication orders were given. Procedure Code(s):        --- Professional ---                           [phone], Esophagogastroduodenoscopy, flexible,                            transoral; diagnostic, including collection of                            specimen(s) by brushing or washing, when performed                            (separate procedure) Diagnosis Code(s):        --- Professional ---                           K92.1, Melena (includes Hematochezia)                           D62, Acute posthemorrhagic anemia                           K92.2, Gastrointestinal hemorrhage, unspecified CPT copyright 2016 American Medical Association. All  rights reserved. The codes documented in this report are preliminary and upon coder review may  be revised to meet current compliance requirements. Lear Ng, MD 12/14/2015 9:54:11 AM This report has been signed electronically. Number of Addenda: 0

## 2015-12-14 NOTE — Op Note (Signed)
[facility] Patient Name: Shelly [name] Procedure Date : 12/14/2015 MRN: [id number] Attending MD: Lear Ng , MD Date of Birth: [birth year] CSN: [id number] Age: 75 Admit Type: Inpatient Procedure:                Colonoscopy Indications:              Last colonoscopy: August 2015, Rectal bleeding,                            Acute post hemorrhagic anemia Providers:                Lear Ng, MD, Alinda Deem, RN,                            William Dalton, Technician Referring MD:              Medicines:                Propofol per Anesthesia, Monitored Anesthesia Care Complications:            No immediate complications. Estimated Blood Loss:     Estimated blood loss: none. Procedure:                Pre-Anesthesia Assessment:                           - Prior to the procedure, a History and Physical                            was performed, and patient medications and                            allergies were reviewed. The patient's tolerance of                            previous anesthesia was also reviewed. The risks                            and benefits of the procedure and the sedation                            options and risks were discussed with the patient.                            All questions were answered, and informed consent                            was obtained. Prior Anticoagulants: The patient has                            taken no previous anticoagulant or antiplatelet                            agents. ASA Grade Assessment: III - A patient with  severe systemic disease. After reviewing the risks                            and benefits, the patient was deemed in                            satisfactory condition to undergo the procedure.                           After obtaining informed consent, the colonoscope                            was passed under direct vision. Throughout the              procedure, the patient's blood pressure, pulse, and                            oxygen saturations were monitored continuously. The                            [id number] scope was introduced through                            the anus and advanced to the the cecum, identified                            by appendiceal orifice and ileocecal valve. The                            colonoscopy was somewhat difficult due to a                            tortuous colon. Successful completion of the                            procedure was aided by straightening and shortening                            the scope to obtain bowel loop reduction. The                            patient tolerated the procedure well. The quality                            of the bowel preparation was adequate and good. The                            terminal ileum, the appendiceal orifice and the                            rectum were photographed. Scope In: 8:55:22 AM Scope Out: 9:13:43 AM Scope Withdrawal Time: 0 hours 12 minutes 55 seconds  Total Procedure Duration: 0 hours 18 minutes 21 seconds  Findings:  The perianal and digital rectal examinations were normal.      Multiple small and large-mouthed diverticula were found in the entire       colon. No active bleeding seen.      Red blood was found in the recto-sigmoid colon, in the sigmoid colon, in       the descending colon and in the transverse colon (scattered dark red       particles and pools of dark red and maroon-colored blood noted).      The terminal ileum appeared normal.      Internal hemorrhoids were found during retroflexion. The hemorrhoids       were medium-sized and Grade I (internal hemorrhoids that do not       prolapse). Impression:               - Diverticulosis in the entire examined colon.                           - Blood in the recto-sigmoid colon, in the sigmoid                            colon, in the descending  colon and in the                            transverse colon.                           - The examined portion of the ileum was normal.                           - Internal hemorrhoids.                           - No specimens collected. Moderate Sedation:      N/A - MAC procedure Recommendation:           - Repeat colonoscopy is not recommended for                            screening purposes.                           - Advance diet as tolerated and soft diet.                           - Post procedure medication orders were given. Procedure Code(s):        --- Professional ---                           [phone], Colonoscopy, flexible; diagnostic, including                            collection of specimen(s) by brushing or washing,                            when performed (separate procedure) Diagnosis Code(s):        --- Professional ---  K62.5, Hemorrhage of anus and rectum                           D62, Acute posthemorrhagic anemia                           K92.2, Gastrointestinal hemorrhage, unspecified                           K64.0, First degree hemorrhoids                           K57.30, Diverticulosis of large intestine without                            perforation or abscess without bleeding CPT copyright 2016 American Medical Association. All rights reserved. The codes documented in this report are preliminary and upon coder review may  be revised to meet current compliance requirements. Lear Ng, MD 12/14/2015 9:51:34 AM This report has been signed electronically. Number of Addenda: 0

## 2015-12-14 NOTE — Anesthesia Preprocedure Evaluation (Addendum)
Anesthesia Evaluation  Patient identified by MRN, date of birth, ID band Patient awake    Reviewed: Allergy & Precautions, NPO status , Patient's Chart, lab work & pertinent test results  Airway Mallampati: II  TM Distance: >3 FB     Dental  (+) Edentulous Upper, Chipped, Dental Advisory Given   Pulmonary neg pulmonary ROS,    breath sounds clear to auscultation       Cardiovascular hypertension, Pt. on medications + angina with exertion + CAD and + Past MI  + dysrhythmias  Rhythm:Regular Rate:Normal     Neuro/Psych Anxiety negative neurological ROS     GI/Hepatic Neg liver ROS, GERD  Medicated and Controlled,  Endo/Other  negative endocrine ROS  Renal/GU negative Renal ROS     Musculoskeletal negative musculoskeletal ROS (+) Arthritis ,   Abdominal (+)  Abdomen: soft. Bowel sounds: normal.  Peds  Hematology negative hematology ROS (+) anemia ,   Anesthesia Other Findings   Reproductive/Obstetrics negative OB ROS                           Anesthesia Physical Anesthesia Plan  ASA: III  Anesthesia Plan: MAC   Post-op Pain Management:    Induction: Intravenous  Airway Management Planned:   Additional Equipment:   Intra-op Plan:   Post-operative Plan:   Informed Consent: I have reviewed the patients History and Physical, chart, labs and discussed the procedure including the risks, benefits and alternatives for the proposed anesthesia with the patient or authorized representative who has indicated his/her understanding and acceptance.     Plan Discussed with:   Anesthesia Plan Comments:         Anesthesia Quick Evaluation

## 2015-12-14 NOTE — Anesthesia Preprocedure Evaluation (Signed)
Anesthesia Evaluation  Patient identified by MRN, date of birth, ID band Patient awake    Reviewed: Allergy & Precautions, NPO status , Patient's Chart, lab work & pertinent test results  Airway        Dental  (+) Dental Advisory Given   Pulmonary           Cardiovascular hypertension, + angina + CAD and + Past MI       Neuro/Psych    GI/Hepatic GERD  ,  Endo/Other    Renal/GU      Musculoskeletal  (+) Arthritis ,   Abdominal   Peds  Hematology  (+) anemia ,   Anesthesia Other Findings EF 55%  Reproductive/Obstetrics                             Anesthesia Physical Anesthesia Plan Anesthesia Quick Evaluation

## 2015-12-14 NOTE — Anesthesia Postprocedure Evaluation (Signed)
Anesthesia Post Note  Patient: Shelly [name]  Procedure(s) Performed: Procedure(s) (LRB): COLONOSCOPY (N/A) ESOPHAGOGASTRODUODENOSCOPY (EGD) (N/A)  Patient location during evaluation: Endoscopy Anesthesia Type: MAC Level of consciousness: awake, awake and alert and oriented Pain management: pain level controlled Respiratory status: nonlabored ventilation, respiratory function stable and spontaneous breathing Cardiovascular status: blood pressure returned to baseline Anesthetic complications: no    Last Vitals:  Vitals:   12/14/15 0950 12/14/15 1500  BP: (!) 116/29 (!) 113/45  Pulse: 73 92  Resp: 20 17  Temp:  37.2 C    Last Pain:  Vitals:   12/14/15 1500  TempSrc: Oral  PainSc:                  Lillyen Schow COKER

## 2015-12-14 NOTE — H&P (View-Only) (Signed)
Patient ID: Shelly [name], female   DOB: Mar 26, 1941, 75 y.o.   MRN: [id number] [facility] Gastroenterology Progress Note  DONNITA CARDINALE 75 y.o. [birth year]   Subjective: Reports rectal bleeding has decreased. Denies abdominal pain. Sitting in bedside chair.  Objective: Vital signs: Vitals:   12/13/15 0728 12/13/15 1026  BP:  122/62  Pulse:  82  Resp:    Temp: 98.6 F (37 C)     Physical Exam: Gen: alert, no acute distress, elderly Abd: soft, nontender, nondistended, +BS  Lab Results:  Recent Labs  12/10/15 1225  12/12/15 0006 12/13/15 0259  NA  --   < > 139 138  K  --   < > 4.2 4.4  CL  --   < > 112* 111  CO2  --   < > 22 23  GLUCOSE  --   < > 110* 119*  BUN  --   < > 18 14  CREATININE  --   < > 1.19* 1.32*  CALCIUM  --   < > 8.7* 8.6*  MG 1.8  --   --  1.9  < > = values in this interval not displayed. No results for input(s): AST, ALT, ALKPHOS, BILITOT, PROT, ALBUMIN in the last 72 hours.  Recent Labs  12/12/15 1338 12/13/15 0259  WBC  --  8.7  HGB 9.0* 8.9*  HCT 27.6* 26.7*  MCV  --  89.9  PLT  --  201      Assessment/Plan: Lower GI bleed -  Likely diverticular. Hgb stable. Continue colon prep and plan for colonoscopy tomorrow. Supportive care.    [place] C. 12/13/2015, 12:00 PM  Pager [phone]  If no answer or after 5 PM call [phone]

## 2015-12-14 NOTE — Interval H&P Note (Signed)
History and Physical Interval Note:  12/14/2015 8:40 AM  Shelly [name]  has presented today for surgery, with the diagnosis of GI bleed  The various methods of treatment have been discussed with the patient and family. After consideration of risks, benefits and other options for treatment, the patient has consented to  Procedure(s) with comments: COLONOSCOPY (N/A) ESOPHAGOGASTRODUODENOSCOPY (EGD) (N/A) - possible EGD, start with colon first as a surgical intervention .  The patient's history has been reviewed, patient examined, no change in status, stable for surgery.  I have reviewed the patient's chart and labs.  Questions were answered to the patient's satisfaction.     [place] C.

## 2015-12-14 NOTE — Anesthesia Procedure Notes (Signed)
Procedure Name: MAC Date/Time: 12/14/2015 8:48 AM Performed by: Lavell Luster Pre-anesthesia Checklist: Patient identified, Emergency Drugs available, Suction available, Patient being monitored and Timeout performed Patient Re-evaluated:Patient Re-evaluated prior to inductionOxygen Delivery Method: Nasal cannula Intubation Type: IV induction Placement Confirmation: positive ETCO2 Dental Injury: Teeth and Oropharynx as per pre-operative assessment

## 2015-12-14 NOTE — Brief Op Note (Signed)
Normal EGD. Diffuse colonic diverticulosis with small amount of blood products seen in colon (mainly in transverse and left colon) without active bleeding seen. Suspect resolved diverticular bleed. Advance diet. No further GI recs. Will sign off. Call if questions.

## 2015-12-14 NOTE — Transfer of Care (Signed)
Immediate Anesthesia Transfer of Care Note  Patient: Shelly [name]  Procedure(s) Performed: Procedure(s) with comments: COLONOSCOPY (N/A) ESOPHAGOGASTRODUODENOSCOPY (EGD) (N/A) - possible EGD, start with colon first  Patient Location: Endoscopy Unit  Anesthesia Type:MAC  Level of Consciousness: awake, alert  and oriented  Airway & Oxygen Therapy: Patient connected to nasal cannula oxygen  Post-op Assessment: Post -op Vital signs reviewed and stable  Post vital signs: stable  Last Vitals:  Vitals:   12/14/15 0422 12/14/15 0754  BP: 135/60 (!) 118/92  Pulse: 93 74  Resp: 19 14  Temp: 36.8 C 36.6 C    Last Pain:  Vitals:   12/14/15 0754  TempSrc: Oral  PainSc:       Patients Stated Pain Goal: 0 ([id number])  Complications: No apparent anesthesia complications

## 2015-12-15 ENCOUNTER — Encounter (HOSPITAL_COMMUNITY): Payer: Self-pay | Admitting: Gastroenterology

## 2015-12-15 LAB — BASIC METABOLIC PANEL
Anion gap: 4 — ABNORMAL LOW (ref 5–15)
BUN: 11 mg/dL (ref 6–20)
CO2: 23 mmol/L (ref 22–32)
CREATININE: 1.33 mg/dL — AB (ref 0.44–1.00)
Calcium: 8.4 mg/dL — ABNORMAL LOW (ref 8.9–10.3)
Chloride: 110 mmol/L (ref 101–111)
GFR, EST AFRICAN AMERICAN: 44 mL/min — AB (ref >60)
GFR, EST NON AFRICAN AMERICAN: 38 mL/min — AB (ref >60)
Glucose, Bld: 109 mg/dL — ABNORMAL HIGH (ref 65–99)
POTASSIUM: 4 mmol/L (ref 3.5–5.1)
SODIUM: 137 mmol/L (ref 135–145)

## 2015-12-15 LAB — CBC
HEMATOCRIT: 22.9 % — AB (ref 36.0–46.0)
Hemoglobin: 7.5 g/dL — ABNORMAL LOW (ref 12.0–15.0)
MCH: 30.1 pg (ref 26.0–34.0)
MCHC: 32.8 g/dL (ref 30.0–36.0)
MCV: 92 fL (ref 78.0–100.0)
PLATELETS: 240 10*3/uL (ref 150–400)
RBC: 2.49 MIL/uL — ABNORMAL LOW (ref 3.87–5.11)
RDW: 15.6 % — AB (ref 11.5–15.5)
WBC: 6.7 10*3/uL (ref 4.0–10.5)

## 2015-12-15 LAB — HEMOGLOBIN AND HEMATOCRIT, BLOOD
HCT: 29.3 % — ABNORMAL LOW (ref 36.0–46.0)
Hemoglobin: 9.3 g/dL — ABNORMAL LOW (ref 12.0–15.0)

## 2015-12-15 LAB — PREPARE RBC (CROSSMATCH)

## 2015-12-15 MED ORDER — SODIUM CHLORIDE 0.9 % IV SOLN
Freq: Once | INTRAVENOUS | Status: AC
  Administered 2015-12-15: 10:00:00 via INTRAVENOUS

## 2015-12-15 MED ORDER — ACETAMINOPHEN 325 MG PO TABS
650.0000 mg | ORAL_TABLET | Freq: Once | ORAL | Status: AC
Start: 2015-12-15 — End: 2015-12-15
  Administered 2015-12-15: 650 mg via ORAL
  Filled 2015-12-15: qty 2

## 2015-12-15 MED ORDER — FUROSEMIDE 10 MG/ML IJ SOLN
20.0000 mg | Freq: Once | INTRAMUSCULAR | Status: AC
  Administered 2015-12-15: 20 mg via INTRAVENOUS
  Filled 2015-12-15: qty 2

## 2015-12-15 MED ORDER — SIMETHICONE 80 MG PO CHEW: 160.0000 mg | CHEWABLE_TABLET | Freq: Four times a day (QID) | ORAL | Status: AC

## 2015-12-15 MED ORDER — DIPHENHYDRAMINE HCL 25 MG PO CAPS
25.0000 mg | ORAL_CAPSULE | Freq: Once | ORAL | Status: AC
  Administered 2015-12-15: 25 mg via ORAL
  Filled 2015-12-15: qty 1

## 2015-12-15 NOTE — Progress Notes (Signed)
PROGRESS NOTE    Shelly [name]  [id number] DOB: [birth year] DOA: 12/09/2015 PCP: [name], MD   Brief Narrative:  Patient is a 75 year old woman with a history of rectal bleeding secondary to pandiverticulosis/diverticular bleed in August 2015, CAD status post non-ST elevation MI with overlapping stents 08/2013, and HTN, who was admitted on 12/09/2015 for large volume rectal bleeding and syncopal episode. In the ED, she was afebrile and hemodynamically stable with a blood pressure in the [id number] systolically. Her lab data were significant for hemoglobin of 9.7, BUN of 25, creatinine of 1.20, glucose of 150, INR 1.08, and PTT of 25. She was admitted for further workup. Her hemoglobin decreased from 9.7-8.4. She was given 1 unit of packed red blood cell transfusion with appropriate increase in her hemoglobin. Early this morning, she had another large volume bloody stool and syncopal episode. Apparently her to telemetry strip showed a third-degree heart block. Dr. Marin Comment discussed the patient with cardiology at [facility] recommended transfer to SDU forfurther evaluation.    Assessment & Plan:   Principal Problem:   Lower GI bleeding Active Problems:   Essential hypertension   CAD (coronary artery disease)   Rectal bleed   Syncope and collapse   Acute blood loss anemia   Pancolonic diverticulosis   Third degree heart block (HCC)  #1 lower GI bleed Likely secondary to an acute diverticular bleed as patient does have a history of pandiverticulosis. Patient had prior lower GI bleed August 2015 and underwent colonoscopy which showed pandiverticulosis and transfusion as needed was recommended. Patient had presented to [facility] 12/09/2015 with rectal bleeding and a syncopal episode at that time hemoglobin was 9.7. GI, Dr. Wonda Amis saw the patient in consultation at [facility] and recommended diagnostic laparoscopy 12/10/2015 however due to cardiac reasons/bradycardia with arrhythmia  patient was transferred to [facility]. Patient was transfused 1 unit packed red blood cells are [facility], and has received a total of 5 units of packed red blood cells. Increased PPI to every 12 hours. Patient has been seen in consultation by GI and patient underwent upper endoscopy this morning which was unremarkable and the colonoscopy which showed multiple small and large mouth diverticula with no active bleeding. Red blood noted in the rectosigmoid colon, sigmoid colon, descending colon, transverse colon. IV PPI changed to Protonix 40 mg daily and patient's diet has been advanced to a soft diet. Hemoglobin currently at 7.5. Transfuse 2 units packed red blood cells. Follow H&H. GI following and appreciate input and recommendations.   #2 syncopal episode with bradycardia and beta blocker induced AV block Prior to admission patient noted to have a syncopal episode at home and 2 episodes in the hospital. Patient had another episode the night of 12/11/15 while on the bedside commode. CT head negative. No further syncopal episodes. Etiology of syncopal episode multifactorial secondary to vasovagal as well as cardioinhibitory. Patient noted to have third-degree AV block on telemetry and subsequently transferred to [facility]. Patient has been seen in consultation by cardiology who had decrease patient's beta blocker and recommended a 30 day event monitor discharged outpatient stress test. Stress test may be done inpatient only if patient is plan for major surgery. 2-D echo with EF of 55-60%. Patient with no further arrhythmias on current dose of beta blocker. Cardiology following.  #3 acute blood loss anemia Secondary to problem #1. Status post 5 units packed red blood cells. Hemoglobin at 7.5 from 8.2 from 8.9 from 9.0 from 7.7. Will transfuse 2  units packed red blood cells. Follow H&H.  #4 hypertension Stable.  #5 hyperlipidemia FLP with LDL of 51. Continue statin.  #6 coronary artery  disease Patient denies any chest pain or shortness of breath. Cardiac enzymes negative. 2-D echo EF 55-60% with no wall motion abnormalities. Aspirin on hold. Continue statin.    DVT prophylaxis: SCDs Code Status: Full Family Communication: Updated patient and family at bedside. Disposition Plan: Home once bleed has resolved and hemoglobin has stabilized and per GI.   Consultants:   Cardiology: Dr.Croitoru 12/10/2015  Gastroenterology: Dr. Michail Sermon 12/10/2015  Procedures:   Ct head 12/09/2015  Echo 12/10/2015  5 units PRBC 12/12/2015 EGD: Dr Michail Sermon 12/14/2015 Colonoscopy : Dr Michail Sermon 12/14/2015 2 units packed red blood cells 12/15/2015 pending   Antimicrobials:  None   Subjective: Patient states bloody bowel movement today. No shortness of breath. No chest pain. Patient tolerating diet.  Objective: Vitals:   12/14/15 2052 12/15/15 0500 12/15/15 0956 12/15/15 1003  BP: 114/69 (!) 113/55  132/68  Pulse: 94 85    Resp: 16 16  (!) 21  Temp: 99.1 F (37.3 C) 98.5 F (36.9 C) 98.7 F (37.1 C) 98.7 F (37.1 C)  TempSrc: Oral Oral Oral Oral  SpO2: 98% 100%  98%  Weight:  104.9 kg (231 lb 3.2 oz)    Height:        Intake/Output Summary (Last 24 hours) at 12/15/15 1015 Last data filed at 12/15/15 0500  Gross per 24 hour  Intake              480 ml  Output             1700 ml  Net            -1220 ml   Filed Weights   12/13/15 0445 12/14/15 0422 12/15/15 0500  Weight: 104.1 kg (229 lb 6.4 oz) 103.8 kg (228 lb 14.4 oz) 104.9 kg (231 lb 3.2 oz)    Examination:  General exam: Appears calm and comfortable, sitting up in chair. Respiratory system: Clear to auscultation on anterior lung fields. Respiratory effort normal. Cardiovascular system: S1 & S2 heard, RRR. No JVD, murmurs, rubs, gallops or clicks. No pedal edema. Gastrointestinal system: Abdomen is nondistended, soft and nontender. No organomegaly or masses felt. Normal bowel sounds heard. Central nervous  system: Alert and oriented. No focal neurological deficits. Extremities: Symmetric 5 x 5 power. Skin: No rashes, lesions or ulcers Psychiatry: Judgement and insight appear normal. Mood & affect appropriate.     Data Reviewed: I have personally reviewed following labs and imaging studies  CBC:  Recent Labs Lab 12/09/15 0638  12/09/15 2316 12/10/15 0417  12/12/15 0400 12/12/15 1338 12/13/15 0259 12/14/15 0438 12/15/15 0254  WBC 6.5  < > 8.1 8.7  --   --   --  8.7 8.0 6.7  NEUTROABS 4.1  --   --   --   --   --   --   --   --   --   HGB 9.7*  < > 8.9* 8.4*  < > 7.7* 9.0* 8.9* 8.2* 7.5*  HCT 30.0*  < > 27.9* 26.3*  < > 23.2* 27.6* 26.7* 25.0* 22.9*  MCV 96.5  < > 94.6 94.6  --   --   --  89.9 91.9 92.0  PLT 255  < > 215 221  --   --   --  201 218 240  < > = values in this interval not displayed.  Basic Metabolic Panel:  Recent Labs Lab 12/10/15 1225 12/11/15 1012 12/12/15 0006 12/13/15 0259 12/14/15 0438 12/15/15 0254  NA  --  140 139 138 141 137  K  --  4.5 4.2 4.4 4.2 4.0  CL  --  115* 112* 111 111 110  CO2  --  22 22 23 24 23   GLUCOSE  --  109* 110* 119* 110* 109*  BUN  --  18 18 14 10 11   CREATININE  --  1.13* 1.19* 1.32* 1.19* 1.33*  CALCIUM  --  8.6* 8.7* 8.6* 8.8* 8.4*  MG 1.8  --   --  1.9 2.0  --    GFR: Estimated Creatinine Clearance: 44.6 mL/min (by C-G formula based on SCr of 1.33 mg/dL). Liver Function Tests:  Recent Labs Lab 12/09/15 0638  AST 19  ALT 12*  ALKPHOS 68  BILITOT 0.5  PROT 6.6  ALBUMIN 3.0*   No results for input(s): LIPASE, AMYLASE in the last 168 hours. No results for input(s): AMMONIA in the last 168 hours. Coagulation Profile:  Recent Labs Lab 12/09/15 0638  INR 1.08   Cardiac Enzymes:  Recent Labs Lab 12/09/15 1333 12/10/15 1225 12/10/15 1712 12/11/15 1012  TROPONINI <0.03 <0.03 <0.03 <0.03   BNP (last 3 results) No results for input(s): PROBNP in the last 8760 hours. HbA1C: No results for input(s): HGBA1C  in the last 72 hours. CBG:  Recent Labs Lab 12/10/15 0444  GLUCAP 135*   Lipid Profile:  Recent Labs  12/14/15 0438  CHOL 97  HDL 31*  LDLCALC 51  TRIG 77  CHOLHDL 3.1   Thyroid Function Tests: No results for input(s): TSH, T4TOTAL, FREET4, T3FREE, THYROIDAB in the last 72 hours. Anemia Panel: No results for input(s): VITAMINB12, FOLATE, FERRITIN, TIBC, IRON, RETICCTPCT in the last 72 hours. Sepsis Labs: No results for input(s): PROCALCITON, LATICACIDVEN in the last 168 hours.  Recent Results (from the past 240 hour(s))  MRSA PCR Screening     Status: None   Collection Time: 12/10/15  9:29 AM  Result Value Ref Range Status   MRSA by PCR NEGATIVE NEGATIVE Final    Comment:        The GeneXpert MRSA Assay (FDA approved for NASAL specimens only), is one component of a comprehensive MRSA colonization surveillance program. It is not intended to diagnose MRSA infection nor to guide or monitor treatment for MRSA infections.          Radiology Studies: No results found.      Scheduled Meds: . atorvastatin  40 mg Oral q1800  . furosemide  20 mg Intravenous Once  . lisinopril  20 mg Oral Daily  . metoprolol succinate  12.5 mg Oral Daily  . omega-3 acid ethyl esters  2 g Oral Daily  . pantoprazole  40 mg Oral Daily   Continuous Infusions:     LOS: 5 days    Time spent: 43 mins    Sharrieff Spratlin, MD [facility] Pager [phone]  If 7PM-7AM, please contact night-coverage [email or web address] Password [email or web address] 12/15/2015, 10:15 AM

## 2015-12-16 LAB — CBC
HCT: 29.3 % — ABNORMAL LOW (ref 36.0–46.0)
Hemoglobin: 9.5 g/dL — ABNORMAL LOW (ref 12.0–15.0)
MCH: 29.5 pg (ref 26.0–34.0)
MCHC: 32.4 g/dL (ref 30.0–36.0)
MCV: 91 fL (ref 78.0–100.0)
PLATELETS: 234 10*3/uL (ref 150–400)
RBC: 3.22 MIL/uL — ABNORMAL LOW (ref 3.87–5.11)
RDW: 16.8 % — AB (ref 11.5–15.5)
WBC: 7.5 10*3/uL (ref 4.0–10.5)

## 2015-12-16 LAB — TYPE AND SCREEN
ABO/RH(D): O POS
Antibody Screen: NEGATIVE
UNIT DIVISION: 0
Unit division: 0

## 2015-12-16 MED ORDER — PANTOPRAZOLE SODIUM 40 MG PO TBEC: 40.0000 mg | DELAYED_RELEASE_TABLET | Freq: Every day | ORAL | 3 refills | Status: DC

## 2015-12-16 MED ORDER — ASPIRIN EC 81 MG PO TBEC: 81.0000 mg | DELAYED_RELEASE_TABLET | Freq: Every day | ORAL | Status: DC

## 2015-12-16 MED ORDER — METOPROLOL SUCCINATE ER 25 MG PO TB24: 12.5000 mg | ORAL_TABLET | Freq: Every day | ORAL | 3 refills | Status: DC

## 2015-12-16 MED ORDER — FENTANYL CITRATE (PF) 100 MCG/2ML IJ SOLN: 25.0000 ug | INTRAMUSCULAR | Status: DC | PRN

## 2015-12-16 MED ORDER — ONDANSETRON HCL 4 MG/2ML IJ SOLN: 4.0000 mg | Freq: Once | INTRAMUSCULAR | Status: DC | PRN

## 2015-12-16 MED ORDER — LISINOPRIL 20 MG PO TABS: 20.0000 mg | ORAL_TABLET | Freq: Every day | ORAL | 3 refills | Status: DC

## 2015-12-16 NOTE — Evaluation (Signed)
Physical Therapy Evaluation Patient Details Name: Shelly [name] MRN: [id number] DOB: August 10, 1940 Today's Date: 12/16/2015   History of Present Illness   75 yo woman admit for GIB, underwent endoscopy 7/28.  Additional problems include syncopal episodes with bradycardia and beta blocker induced AV block, acute blood loss anemia (Hgb 9.5 after multiple units transfused); hx HTN, HLD, CAD.  MD plan to d/c home once GIB resolved and Hgb stabilized.   Clinical Impression  Pt presents at/near her baseline mobility level.  She has preexisting knee pain in left, likely due to OA, which is self-limiting.  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  She was instructed on interventions to manage knee pain and facilitate optimal mobility.  She would benefit from OPPT to address knee pain, mild quad weakness and resulting compensatory mobility strategies, but would defer to pt to choose this service.  Recommend walk hall with nursing to maintain mobility status and urge pt to pursue OPPT if she is willing and able.  No further acute PT needs.     Follow Up Recommendations Outpatient PT (if pt chooses to pursue)    Equipment Recommendations  None recommended by PT    Recommendations for Other Services       Precautions / Restrictions Precautions Precautions: None Restrictions Weight Bearing Restrictions: No      Mobility  Bed Mobility Overal bed mobility: Independent                Transfers Overall transfer level: Independent Equipment used: None                Ambulation/Gait Ambulation/Gait assistance: Independent Ambulation Distance (Feet): 25 Feet Assistive device: None Gait Pattern/deviations: Step-through pattern;Antalgic;Decreased weight shift to left     General Gait Details: pt compensates for knee pain with asymmetric wb pattern, doesn't use device,  Stairs Stairs: Yes Stairs assistance: Modified independent (Device/Increase  time) Stair Management: One rail Right;Alternating pattern Number of Stairs: 5 General stair comments: instructed on up with good/down with bad, sideways techniques, pt given handout  Wheelchair Mobility    Modified Rankin (Stroke Patients Only)       Balance Overall balance assessment: No apparent balance deficits (not formally assessed)                                           Pertinent Vitals/Pain Pain Assessment: No/denies pain    Home Living Family/patient expects to be discharged to:: Private residence Living Arrangements: Other relatives Available Help at Discharge: Family;Available PRN/intermittently Type of Home: House Home Access: Level entry     Home Layout: One level Home Equipment: None Additional Comments: 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), pt instructed (demo, handout) on technique with painful left knee    Prior Function Level of Independence: Independent               Hand Dominance        Extremity/Trunk Assessment   Upper Extremity Assessment: Overall WFL for tasks assessed           Lower Extremity Assessment: Overall WFL for tasks assessed         Communication   Communication: No difficulties  Cognition Arousal/Alertness: Awake/alert Behavior During Therapy: WFL for tasks assessed/performed Overall Cognitive Status: Within Functional Limits for tasks assessed  General Comments General comments (skin integrity, edema, etc.): pt fiercely independent and adequately active but benefits from focused intervention for knee pain/weakness    Exercises General Exercises - Lower Extremity Ankle Circles/Pumps: AROM;Left;10 reps;Seated Quad Sets: Left;10 reps;Seated Long Arc Quad: Left;10 reps;Seated Mini-Sqauts: AROM;5 reps (strong up. slow down)      Assessment/Plan    PT Assessment All further PT needs can be met in the next venue of care  PT Diagnosis Difficulty walking   PT  Problem List Decreased strength;Decreased range of motion;Decreased mobility;Obesity;Pain  PT Treatment Interventions     PT Goals (Current goals can be found in the Care Plan section) Acute Rehab PT Goals Patient Stated Goal: keep working PT Goal Formulation: All assessment and education complete, DC therapy    Frequency     Barriers to discharge        Co-evaluation               End of Session   Activity Tolerance: Patient tolerated treatment well Patient left: in chair;with call bell/phone within reach;with nursing/sitter in room;with family/visitor present Nurse Communication: Mobility status         Time: 3017-2091 PT Time Calculation (min) (ACUTE ONLY): 56 min   Charges:   PT Evaluation $PT Eval Low Complexity: 1 Procedure PT Treatments $Therapeutic Exercise: 23-37 mins $Therapeutic Activity: 8-22 mins   PT G Codes:        Herbie Drape 12/16/2015, 10:54 AM

## 2015-12-16 NOTE — Discharge Summary (Signed)
Physician Discharge Summary  Shelly [name] [id number] DOB: 20-Oct-1940 DOA: 12/09/2015  PCP: [name], MD  Admit date: 12/09/2015 Discharge date: 12/16/2015  Time spent: 65 minutes  Recommendations for Outpatient Follow-up:  1. Follow-up with [facility], MD in 2 weeks. On follow-up patient in need a CBC done to follow-up on H&H. Patient will also need a basic metabolic profile done to follow-up on electrolytes and renal function. Patient's blood pressure also need to be reassessed as patient's metoprolol and lisinopril dose was decreased in half. 2. Follow-up with cardiology on 12/17/2015 to pick up a Holter monitor for further evaluation of patient's syncopal episodes. Patient will also be set up for outpatient stress test.  3. Follow-up with Dr.Koneswaren on 01/28/2016.   Discharge Diagnoses:  Principal Problem:   Lower GI bleeding Active Problems:   Essential hypertension   CAD (coronary artery disease)   Rectal bleed   Syncope and collapse   Acute blood loss anemia   Pancolonic diverticulosis   Third degree heart block ([place])   Discharge Condition: Stable and improved  Diet recommendation: Heart healthy  Filed Weights   12/14/15 0422 12/15/15 0500 12/16/15 0347  Weight: 103.8 kg (228 lb 14.4 oz) 104.9 kg (231 lb 3.2 oz) 104.3 kg (230 lb)    History of present illness:  Per Dr Wandra Mannan is a 75 y.o. female with medical history significant for rectal bleeding secondary to pan diverticulosis bleed in August 2015, CAD status post non-ST elevation MI with overlapping stents 08/2013, and hypertension, who presented with large volume rectal bleeding. The patient was in her usual state of health when she got up the morning of admission, at approximately 3:00 to use the bathroom. She had a bowel movement with blood mixed in with the stool, a moderate amount. She felt a little "funny" but was able to get back to bed. She got up again at approximately 4 to 4:30 AM.  She then proceeded to have a large volume of bright red blood, but no stool. The next thing she knew, she was on the floor after losing consciousness while sitting on the toilet. She hit her forehead on the left side, which resulted in some swelling, but no bleeding. Her family heard the fall and found her on the floor unresponsive, but she became responsive with provocation. When EMS arrived, she was found to be hypotensive. She was given IV fluids.  ED Course: In the ED, she was afebrile and hemodynamically stable with a blood pressure in the [id number] systolically. Her lab data were significant for hemoglobin of 9.7, BUN of 25, creatinine of 1.20, glucose of 150, INR 1.08, and PTT of 25. She was admitted for further evaluation and management.  Hospital Course:  #1 lower GI bleed Likely secondary to an acute diverticular bleed as patient does have a history of pandiverticulosis. Patient had prior lower GI bleed August 2015 and underwent colonoscopy which showed pandiverticulosis and transfusion as needed was recommended. Patient had presented to [facility] 12/09/2015 with rectal bleeding and a syncopal episode at that time hemoglobin was 9.7. GI, Dr. Wonda Amis saw the patient in consultation at [facility] and recommended diagnostic colonoscopy 12/10/2015, however due to cardiac reasons/bradycardia with arrhythmia patient was transferred to [facility]. Patient was transfused 1 unit packed red blood cells are [facility], and has received a total of 7 units of packed red blood cells. Increased PPI to every 12 hours. Patient has been seen in consultation by GI and patient underwent  upper endoscopy, which was unremarkable and the colonoscopy which showed multiple small and large mouth diverticula with no active bleeding. Red blood noted in the rectosigmoid colon, sigmoid colon, descending colon, transverse colon. IV PPI changed to Protonix 40 mg daily and patient's diet has been advanced to a soft  diet. Patient's hemoglobin improved posttransfusion such that by day of discharge patient's hemoglobin was 9.5. Patient be discharged home in stable and improved condition and will follow-up with PCP as outpatient.   #2 syncopal episode with bradycardia and beta blocker induced AV block Prior to admission patient noted to have a syncopal episode at home and 2 episodes in the hospital. Patient had another episode the night of 12/11/15 while on the bedside commode. CT head negative. No further syncopal episodes. Etiology of syncopal episode multifactorial secondary to vasovagal as well as cardioinhibitory. Patient noted to have third-degree AV block on telemetry and subsequently transferred to [facility]. Patient has been seen in consultation by cardiology who had decreased patient's beta blocker and recommended a 30 day event monitor on discharged and outpatient stress test. Stress test may be done inpatient only if patient is plan for major surgery. 2-D echo with EF of 55-60%. Patient with no further arrhythmias on current dose of beta blocker. Patient will follow-up with cardiology and outpatient setting.  #3 acute blood loss anemia Secondary to problem #1. Status post 7 units packed red blood cells during this hospitalization. On day of discharge patient's hemoglobin was 9.5. Outpatient follow-up.  #4 hypertension Stable.  #5 hyperlipidemia FLP with LDL of 51. Patient was maintained on home regimen statin.  #6 coronary artery disease Patient denied any chest pain or shortness of breath. Cardiac enzymes negative. 2-D echo EF 55-60% with no wall motion abnormalities. Aspirin on hold. Patient was maintained on a statin. Patient is advised to resume aspirin in 3 weeks.    Procedures:  Ct head 12/09/2015  Echo 12/10/2015  7 units PRBC 12/12/2015-12/15/2015 EGD: Dr Michail Sermon 12/14/2015 Colonoscopy : Dr Michail Sermon 12/14/2015   Consultations:  Cardiology: Dr.Croitoru  12/10/2015  Gastroenterology: Dr. Michail Sermon 12/10/2015   Discharge Exam: Vitals:   12/15/15 1959 12/16/15 0447  BP: (!) 159/60 (!) 118/50  Pulse: 76 73  Resp: 18 18  Temp: 98.6 F (37 C) 98.9 F (37.2 C)    General: NAD Cardiovascular: RRR Respiratory: CTAB  Discharge Instructions   Discharge Instructions    Ambulatory referral to Physical Therapy    Complete by:  As directed   Diet - low sodium heart healthy    Complete by:  As directed   Discharge instructions    Complete by:  As directed   Follow up with [facility], MD in 2 weeks.   Increase activity slowly    Complete by:  As directed     Current Discharge Medication List    CONTINUE these medications which have CHANGED   Details  aspirin EC 81 MG tablet Take 1 tablet (81 mg total) by mouth daily. Resume in 3 weeks.    lisinopril (PRINIVIL,ZESTRIL) 20 MG tablet Take 1 tablet (20 mg total) by mouth daily. Qty: 30 tablet, Refills: 3    metoprolol succinate (TOPROL-XL) 25 MG 24 hr tablet Take 0.5 tablets (12.5 mg total) by mouth daily. Qty: 30 tablet, Refills: 3    pantoprazole (PROTONIX) 40 MG tablet Take 1 tablet (40 mg total) by mouth daily. Qty: 30 tablet, Refills: 3      CONTINUE these medications which have NOT CHANGED   Details  atorvastatin (LIPITOR) 40 MG tablet TAKE ONE TABLET BY MOUTH DAILY AT 6 PM. Qty: 90 tablet, Refills: 1    b complex vitamins capsule Take 1 capsule by mouth daily.    diclofenac (VOLTAREN) 75 MG EC tablet Take 75 mg by mouth daily as needed for mild pain or moderate pain.     ibuprofen (ADVIL,MOTRIN) 400 MG tablet Take 1 tablet (400 mg total) by mouth every 6 (six) hours as needed for headache or moderate pain. Qty: 30 tablet, Refills: 0    nitroGLYCERIN (NITROSTAT) 0.4 MG SL tablet Place 1 tablet (0.4 mg total) under the tongue every 5 (five) minutes as needed for chest pain. Qty: 25 tablet, Refills: 3   Associated Diagnoses: NSTEMI (non-ST elevated myocardial infarction)  (HCC)    Omega-3 Fatty Acids (FISH OIL) 1000 MG CAPS Take 3,000 mg by mouth daily.        Allergies  Allergen Reactions  . Bayer Advanced Aspirin [Aspirin] Palpitations    Patient can tolerate "low dose" aspirin  . Codeine Other (See Comments)    Makes her feel "high"   Follow-up Information    [facility] Follow up on 12/17/2015.   Specialty:  Cardiology Why:  12:30pm to pick up your heart monitor.  Contact information: [address] [place] Frederic [place], MD Follow up on 01/28/2016.   Specialty:  Cardiology Why:  10:20am for your follow up. Contact information: [place] [address] [phone]        [facility], MD. Schedule an appointment as soon as possible for a visit in 2 week(s).   Specialty:  Internal Medicine Contact information: [place] [address] [phone]            The results of significant diagnostics from this hospitalization (including imaging, microbiology, ancillary and laboratory) are listed below for reference.    Significant Diagnostic Studies: Ct Head Wo Contrast  Result Date: 12/09/2015 CLINICAL DATA:  75 year old female with acute syncope and collapse. EXAM: CT HEAD WITHOUT CONTRAST TECHNIQUE: Contiguous axial images were obtained from the base of the skull through the vertex without intravenous contrast. COMPARISON:  None. FINDINGS: Mild probable chronic small-vessel white matter ischemic changes are noted. Bilateral basal ganglia calcifications noted -physiologic versus metabolic. No acute intracranial abnormalities are identified, including mass lesion or mass effect, hydrocephalus, extra-axial fluid collection, midline shift, hemorrhage, or acute infarction. The visualized bony calvarium is unremarkable. IMPRESSION: No evidence of acute intracranial abnormality. Mild probable chronic small-vessel white matter ischemic  changes. Electronically Signed   By: Margarette Canada M.D.   On: 12/09/2015 17:33   Microbiology: Recent Results (from the past 240 hour(s))  MRSA PCR Screening     Status: None   Collection Time: 12/10/15  9:29 AM  Result Value Ref Range Status   MRSA by PCR NEGATIVE NEGATIVE Final    Comment:        The GeneXpert MRSA Assay (FDA approved for NASAL specimens only), is one component of a comprehensive MRSA colonization surveillance program. It is not intended to diagnose MRSA infection nor to guide or monitor treatment for MRSA infections.      Labs: Basic Metabolic Panel:  Recent Labs Lab 12/10/15 1225 12/11/15 1012 12/12/15 0006 12/13/15 0259 12/14/15 0438 12/15/15 0254  NA  --  140 139 138 141 137  K  --  4.5 4.2 4.4 4.2 4.0  CL  --  115* 112* 111 111 110  CO2  --  22 22 23 24 23   GLUCOSE  --  109* 110* 119* 110* 109*  BUN  --  18 18 14 10 11   CREATININE  --  1.13* 1.19* 1.32* 1.19* 1.33*  CALCIUM  --  8.6* 8.7* 8.6* 8.8* 8.4*  MG 1.8  --   --  1.9 2.0  --    Liver Function Tests: No results for input(s): AST, ALT, ALKPHOS, BILITOT, PROT, ALBUMIN in the last 168 hours. No results for input(s): LIPASE, AMYLASE in the last 168 hours. No results for input(s): AMMONIA in the last 168 hours. CBC:  Recent Labs Lab 12/10/15 0417  12/13/15 0259 12/14/15 0438 12/15/15 0254 12/15/15 1757 12/16/15 0437  WBC 8.7  --  8.7 8.0 6.7  --  7.5  HGB 8.4*  < > 8.9* 8.2* 7.5* 9.3* 9.5*  HCT 26.3*  < > 26.7* 25.0* 22.9* 29.3* 29.3*  MCV 94.6  --  89.9 91.9 92.0  --  91.0  PLT 221  --  201 218 240  --  234  < > = values in this interval not displayed. Cardiac Enzymes:  Recent Labs Lab 12/09/15 1333 12/10/15 1225 12/10/15 1712 12/11/15 1012  TROPONINI <0.03 <0.03 <0.03 <0.03   BNP: BNP (last 3 results) No results for input(s): BNP in the last 8760 hours.  ProBNP (last 3 results) No results for input(s): PROBNP in the last 8760 hours.  CBG:  Recent Labs Lab  12/10/15 0444  GLUCAP 135*       Signed:  Livie Vanderhoof MD.  [facility] 12/16/2015, 1:28 PM

## 2015-12-16 NOTE — Progress Notes (Signed)
Discharge instructions given and patient verbalized understanding.Patient discharged home stable in the company of her daughter and granddaughter.

## 2015-12-17 ENCOUNTER — Ambulatory Visit (INDEPENDENT_AMBULATORY_CARE_PROVIDER_SITE_OTHER): Payer: Medicare Other

## 2015-12-17 DIAGNOSIS — R55 Syncope and collapse: Secondary | ICD-10-CM

## 2016-01-28 ENCOUNTER — Encounter: Payer: Self-pay | Admitting: Cardiovascular Disease

## 2016-01-28 ENCOUNTER — Ambulatory Visit (INDEPENDENT_AMBULATORY_CARE_PROVIDER_SITE_OTHER): Payer: Medicare Other | Admitting: Cardiovascular Disease

## 2016-01-28 VITALS — BP 129/81 | HR 72 | Ht 64.0 in | Wt 234.0 lb

## 2016-01-28 DIAGNOSIS — E785 Hyperlipidemia, unspecified: Secondary | ICD-10-CM | POA: Diagnosis not present

## 2016-01-28 DIAGNOSIS — I10 Essential (primary) hypertension: Secondary | ICD-10-CM

## 2016-01-28 DIAGNOSIS — I252 Old myocardial infarction: Secondary | ICD-10-CM

## 2016-01-28 DIAGNOSIS — I2510 Atherosclerotic heart disease of native coronary artery without angina pectoris: Secondary | ICD-10-CM | POA: Diagnosis not present

## 2016-01-28 DIAGNOSIS — Z87898 Personal history of other specified conditions: Secondary | ICD-10-CM

## 2016-01-28 DIAGNOSIS — Z9289 Personal history of other medical treatment: Secondary | ICD-10-CM

## 2016-01-28 NOTE — Patient Instructions (Signed)
Your physician wants you to follow-up in: 6 months Dr Koneswaran You will receive a reminder letter in the mail two months in advance. If you don't receive a letter, please call our office to schedule the follow-up appointment.    Your physician recommends that you continue on your current medications as directed. Please refer to the Current Medication list given to you today.     If you need a refill on your cardiac medications before your next appointment, please call your pharmacy.     Thank you for choosing [facility] !         

## 2016-01-28 NOTE — Progress Notes (Signed)
SUBJECTIVE: The patient returns for follow up of CAD. Hospitalized in July for lower GI bleed and recurrent syncope. Toprol-XL dose reduced. Event monitor showed no arrhythmias.  Feeling well. Denies chest pain/SOB. Occasional ankle swelling.   Review of Systems: As per "subjective", otherwise negative.  Allergies  Allergen Reactions  . Bayer Advanced Aspirin [Aspirin] Palpitations    Patient can tolerate "low dose" aspirin  . Codeine Other (See Comments)    Makes her feel "high"    Current Outpatient Prescriptions  Medication Sig Dispense Refill  . aspirin EC 81 MG tablet Take 1 tablet (81 mg total) by mouth daily. Resume in 3 weeks.    Marland Kitchen atorvastatin (LIPITOR) 40 MG tablet TAKE ONE TABLET BY MOUTH DAILY AT 6 PM. 90 tablet 1  . b complex vitamins capsule Take 1 capsule by mouth daily.    . diclofenac (VOLTAREN) 75 MG EC tablet Take 75 mg by mouth daily as needed for mild pain or moderate pain.     Marland Kitchen ibuprofen (ADVIL,MOTRIN) 400 MG tablet Take 1 tablet (400 mg total) by mouth every 6 (six) hours as needed for headache or moderate pain. 30 tablet 0  . lisinopril (PRINIVIL,ZESTRIL) 20 MG tablet Take 1 tablet (20 mg total) by mouth daily. 30 tablet 3  . metoprolol succinate (TOPROL-XL) 25 MG 24 hr tablet Take 0.5 tablets (12.5 mg total) by mouth daily. 30 tablet 3  . nitroGLYCERIN (NITROSTAT) 0.4 MG SL tablet Place 1 tablet (0.4 mg total) under the tongue every 5 (five) minutes as needed for chest pain. 25 tablet 3  . Omega-3 Fatty Acids (FISH OIL) 1000 MG CAPS Take 3,000 mg by mouth daily.     . pantoprazole (PROTONIX) 40 MG tablet Take 1 tablet (40 mg total) by mouth daily. 30 tablet 3   No current facility-administered medications for this visit.     Past Medical History:  Diagnosis Date  . Anginal pain ([place])   . Arthritis   . Coronary artery disease    SEVERE  . GERD (gastroesophageal reflux disease)   . GI bleed 12/2013  . Goiter   . Hypertension   . Myocardial  infarction ([place]) 08/2013  . Pancolonic diverticulosis 12/2013    Past Surgical History:  Procedure Laterality Date  . COLONOSCOPY N/A 01/13/2014   Procedure: COLONOSCOPY;  Surgeon: Beryle Beams, MD;  Location: [facility];  Service: Endoscopy;  Laterality: N/A;  . COLONOSCOPY N/A 12/14/2015   Procedure: COLONOSCOPY;  Surgeon: Wilford Corner, MD;  Location: [facility] ENDOSCOPY;  Service: Endoscopy;  Laterality: N/A;  . CORNEAL TRANSPLANT    . CORONARY ANGIOPLASTY  08/2013  . ESOPHAGOGASTRODUODENOSCOPY N/A 12/14/2015   Procedure: ESOPHAGOGASTRODUODENOSCOPY (EGD);  Surgeon: Wilford Corner, MD;  Location: [facility] ENDOSCOPY;  Service: Endoscopy;  Laterality: N/A;  possible EGD, start with colon first  . HEEL SPUR EXCISION    . LEFT HEART CATHETERIZATION WITH CORONARY ANGIOGRAM N/A 08/23/2013   Procedure: LEFT HEART CATHETERIZATION WITH CORONARY ANGIOGRAM;  Surgeon: Peter M Martinique, MD;  Location: [facility] CATH LAB;  Service: Cardiovascular;  Laterality: N/A;  . PERCUTANEOUS CORONARY STENT INTERVENTION (PCI-S) N/A 08/24/2013   Procedure: PERCUTANEOUS CORONARY STENT INTERVENTION (PCI-S);  Surgeon: Peter M Martinique, MD;  Location: [facility] CATH LAB;  Service: Cardiovascular;  Laterality: N/A;  . TONSILLECTOMY    . TUBAL LIGATION      Social History   Social History  . Marital status: Single    Spouse name: N/A  . Number of children: N/A  .  Years of education: N/A   Occupational History  . Not on file.   Social History Main Topics  . Smoking status: Never Smoker  . Smokeless tobacco: Never Used  . Alcohol use No  . Drug use: No  . Sexual activity: No   Other Topics Concern  . Not on file   Social History Narrative  . No narrative on file     Vitals:   01/28/16 1024  BP: 129/81  Pulse: 72  SpO2: 97%  Weight: 234 lb (106.1 kg)  Height: 5\' 4"  (1.626 m)    PHYSICAL EXAM General: NAD HEENT: Normal. Neck: No JVD, no thyromegaly. Lungs: Clear to auscultation bilaterally with normal respiratory  effort. CV: Nondisplaced PMI.  Regular rate and rhythm, normal S1/S2, no S3/S4, no murmur. No pretibial or periankle edema.     Abdomen: Soft, nontender, obese. Neurologic: Alert and oriented.  Psych: Normal affect. Skin: Normal. Musculoskeletal: No gross deformities.    ECG: Most recent ECG reviewed.      ASSESSMENT AND PLAN: 1. CAD s/p NSTEMI with overlapping stents in the proximal to mid RCA on 08/24/13: Stable ischemic heart disease. She has residual moderate CAD but denies recurrent anginal symptoms. Continue ASA, lisinopril, Lipitor, and Toprol-XL.   2. Essential hypertension: Controlled. No changes.  3. Hyperlipidemia: Continue atorvastatin.   Dispo: f/u 6 months.  [name], M.D., F.A.C.C.

## 2016-04-03 ENCOUNTER — Observation Stay (HOSPITAL_COMMUNITY)
Admission: EM | Admit: 2016-04-03 | Discharge: 2016-04-05 | Disposition: A | Discharge status: Home / Self Care | Payer: Medicare Other | Attending: Internal Medicine | Admitting: Internal Medicine

## 2016-04-03 ENCOUNTER — Encounter (HOSPITAL_COMMUNITY): Payer: Self-pay | Admitting: *Deleted

## 2016-04-03 DIAGNOSIS — I2510 Atherosclerotic heart disease of native coronary artery without angina pectoris: Secondary | ICD-10-CM | POA: Insufficient documentation

## 2016-04-03 DIAGNOSIS — N184 Chronic kidney disease, stage 4 (severe): Secondary | ICD-10-CM | POA: Diagnosis present

## 2016-04-03 DIAGNOSIS — R600 Localized edema: Secondary | ICD-10-CM | POA: Insufficient documentation

## 2016-04-03 DIAGNOSIS — K5730 Diverticulosis of large intestine without perforation or abscess without bleeding: Secondary | ICD-10-CM | POA: Diagnosis present

## 2016-04-03 DIAGNOSIS — N183 Chronic kidney disease, stage 3 unspecified: Secondary | ICD-10-CM | POA: Diagnosis present

## 2016-04-03 DIAGNOSIS — Z79899 Other long term (current) drug therapy: Secondary | ICD-10-CM | POA: Insufficient documentation

## 2016-04-03 DIAGNOSIS — D649 Anemia, unspecified: Secondary | ICD-10-CM | POA: Diagnosis not present

## 2016-04-03 DIAGNOSIS — Z955 Presence of coronary angioplasty implant and graft: Secondary | ICD-10-CM | POA: Insufficient documentation

## 2016-04-03 DIAGNOSIS — K922 Gastrointestinal hemorrhage, unspecified: Principal | ICD-10-CM | POA: Diagnosis present

## 2016-04-03 DIAGNOSIS — K625 Hemorrhage of anus and rectum: Secondary | ICD-10-CM | POA: Diagnosis present

## 2016-04-03 DIAGNOSIS — K219 Gastro-esophageal reflux disease without esophagitis: Secondary | ICD-10-CM | POA: Diagnosis present

## 2016-04-03 DIAGNOSIS — I10 Essential (primary) hypertension: Secondary | ICD-10-CM | POA: Diagnosis not present

## 2016-04-03 DIAGNOSIS — M1990 Unspecified osteoarthritis, unspecified site: Secondary | ICD-10-CM | POA: Diagnosis present

## 2016-04-03 DIAGNOSIS — D62 Acute posthemorrhagic anemia: Secondary | ICD-10-CM | POA: Diagnosis present

## 2016-04-03 DIAGNOSIS — I129 Hypertensive chronic kidney disease with stage 1 through stage 4 chronic kidney disease, or unspecified chronic kidney disease: Secondary | ICD-10-CM | POA: Diagnosis not present

## 2016-04-03 LAB — COMPREHENSIVE METABOLIC PANEL
ALBUMIN: 3.3 g/dL — AB (ref 3.5–5.0)
ALT: 13 U/L — ABNORMAL LOW (ref 14–54)
ANION GAP: 5 (ref 5–15)
AST: 20 U/L (ref 15–41)
Alkaline Phosphatase: 79 U/L (ref 38–126)
BUN: 33 mg/dL — ABNORMAL HIGH (ref 6–20)
CHLORIDE: 110 mmol/L (ref 101–111)
CO2: 23 mmol/L (ref 22–32)
Calcium: 8.9 mg/dL (ref 8.9–10.3)
Creatinine, Ser: 1.41 mg/dL — ABNORMAL HIGH (ref 0.44–1.00)
GFR calc non Af Amer: 36 mL/min — ABNORMAL LOW (ref >60)
GFR, EST AFRICAN AMERICAN: 41 mL/min — AB (ref >60)
Glucose, Bld: 155 mg/dL — ABNORMAL HIGH (ref 65–99)
Potassium: 4.2 mmol/L (ref 3.5–5.1)
SODIUM: 138 mmol/L (ref 135–145)
Total Bilirubin: 0.4 mg/dL (ref 0.3–1.2)
Total Protein: 6.8 g/dL (ref 6.5–8.1)

## 2016-04-03 LAB — SAMPLE TO BLOOD BANK

## 2016-04-03 LAB — CBC WITH DIFFERENTIAL/PLATELET
Basophils Absolute: 0 10*3/uL (ref 0.0–0.1)
Basophils Relative: 1 %
Eosinophils Absolute: 0.1 10*3/uL (ref 0.0–0.7)
Eosinophils Relative: 1 %
HEMATOCRIT: 29.1 % — AB (ref 36.0–46.0)
HEMOGLOBIN: 9.4 g/dL — AB (ref 12.0–15.0)
LYMPHS ABS: 2.5 10*3/uL (ref 0.7–4.0)
LYMPHS PCT: 31 %
MCH: 30.1 pg (ref 26.0–34.0)
MCHC: 32.3 g/dL (ref 30.0–36.0)
MCV: 93.3 fL (ref 78.0–100.0)
MONOS PCT: 6 %
Monocytes Absolute: 0.5 10*3/uL (ref 0.1–1.0)
NEUTROS ABS: 5 10*3/uL (ref 1.7–7.7)
NEUTROS PCT: 61 %
Platelets: 252 10*3/uL (ref 150–400)
RBC: 3.12 MIL/uL — ABNORMAL LOW (ref 3.87–5.11)
RDW: 14.6 % (ref 11.5–15.5)
WBC: 8.1 10*3/uL (ref 4.0–10.5)

## 2016-04-03 MED ORDER — METOPROLOL SUCCINATE ER 25 MG PO TB24
12.5000 mg | ORAL_TABLET | Freq: Every day | ORAL | Status: DC
  Administered 2016-04-04 – 2016-04-05 (×2): 12.5 mg via ORAL
  Filled 2016-04-03 (×3): qty 1

## 2016-04-03 MED ORDER — ACETAMINOPHEN 325 MG PO TABS
650.0000 mg | ORAL_TABLET | Freq: Four times a day (QID) | ORAL | Status: DC | PRN
  Administered 2016-04-04: 650 mg via ORAL
  Filled 2016-04-03: qty 2

## 2016-04-03 MED ORDER — ONDANSETRON HCL 4 MG PO TABS: 4.0000 mg | ORAL_TABLET | Freq: Four times a day (QID) | ORAL | Status: DC | PRN

## 2016-04-03 MED ORDER — LISINOPRIL 10 MG PO TABS
20.0000 mg | ORAL_TABLET | Freq: Every day | ORAL | Status: DC
  Administered 2016-04-04 – 2016-04-05 (×2): 20 mg via ORAL
  Filled 2016-04-03 (×2): qty 2

## 2016-04-03 MED ORDER — ALBUTEROL SULFATE (2.5 MG/3ML) 0.083% IN NEBU: 2.5000 mg | INHALATION_SOLUTION | RESPIRATORY_TRACT | Status: DC | PRN

## 2016-04-03 MED ORDER — ONDANSETRON HCL 4 MG/2ML IJ SOLN: 4.0000 mg | Freq: Four times a day (QID) | INTRAMUSCULAR | Status: DC | PRN

## 2016-04-03 MED ORDER — SODIUM CHLORIDE 0.9 % IV SOLN
INTRAVENOUS | Status: DC
  Administered 2016-04-04 (×2): via INTRAVENOUS

## 2016-04-03 MED ORDER — ACETAMINOPHEN 650 MG RE SUPP: 650.0000 mg | Freq: Four times a day (QID) | RECTAL | Status: DC | PRN

## 2016-04-03 MED ORDER — NITROGLYCERIN 0.4 MG SL SUBL: 0.4000 mg | SUBLINGUAL_TABLET | SUBLINGUAL | Status: DC | PRN

## 2016-04-03 MED ORDER — ATORVASTATIN CALCIUM 40 MG PO TABS
40.0000 mg | ORAL_TABLET | Freq: Every day | ORAL | Status: DC
  Administered 2016-04-04: 40 mg via ORAL
  Filled 2016-04-03: qty 1

## 2016-04-03 MED ORDER — PANTOPRAZOLE SODIUM 40 MG PO TBEC
40.0000 mg | DELAYED_RELEASE_TABLET | Freq: Two times a day (BID) | ORAL | Status: DC
  Administered 2016-04-04 – 2016-04-05 (×4): 40 mg via ORAL
  Filled 2016-04-03 (×4): qty 1

## 2016-04-03 MED ORDER — SODIUM CHLORIDE 0.9% FLUSH
3.0000 mL | Freq: Two times a day (BID) | INTRAVENOUS | Status: DC
  Administered 2016-04-04: 3 mL via INTRAVENOUS

## 2016-04-03 MED ORDER — SODIUM CHLORIDE 0.9 % IV SOLN: Freq: Once | INTRAVENOUS | Status: DC

## 2016-04-03 NOTE — ED Notes (Signed)
Report given to Nicole.

## 2016-04-03 NOTE — ED Triage Notes (Signed)
Pt c/o rectal bleeding that started today; pt has some nausea, but no vomiting and states she doesn't have any real pain, just feels like her stomach is growling

## 2016-04-03 NOTE — H&P (Addendum)
History and Physical    Shelly [name] CBJ:[id number] DOB: 06/23/1940 DOA: 04/03/2016  Referring MD/NP/PA: Dr. Alvino Chapel PCP: [name], MD  Patient coming from: home  Chief Complaint: Blood in stool  HPI: Shelly [name] is a 75 y.o. female with medical history significant of HTN, MI, diverticulosis, GI bleed, GERD, and arthritis; who presents for complaints of blood in her stool. Symptoms started around 1 PM today. Patient notes that when she used the restroom her bowel movement had blood which appeared mixed in the stool. At around 4:30 PM patient had another bowel movement and reports blood clots present in the toilet bowl. Then just prior to arrival around 6 PM, patient had to urinate and noted blood cards on the tissue when she wiped. Associated symptoms included that her stomach has been growling. Patient denies having any lightheadedness, shortness of breath, palpitations, loss of consciousness, chest pain, fever, chills, or diarrhea. Patient was recently prescribed diclofenac and states that she's been taking it intermittently for arthritis pain, but denies regular use of any other NSAIDs. Patient does take a baby aspirin, but is not on any other blood thinners at this time. Patient reports having similar symptoms previously in the past most recently in July. Patient was transfused over 7 units of blood during that hospital stay in the hospital at [facility], and it was thought to be related to a diverticular bleed. She was scoped by Dr. Michail Sermon of gastroenterology who performed an EGD and colonoscopy. EGD revealed no acute abnormalities. Colonoscopy revealed diverticulosis of the entire colon with internal hemorrhoids.  ED Course: Upon admission to pursue department patient was seen to be afebrile with all other vital signs stable. However revealed hemoglobin 9.4, BUN 33, creatinine 1.41, and all other labs are relatively within normal limits. The ED physician performed a rectal exam  and noted blood in the rectal vault. TRH called to admit for observation given previous history of diverticular bleed.  Review of Systems: As per HPI otherwise 10 point review of systems negative.   Past Medical History:  Diagnosis Date  . Anginal pain ([place])   . Arthritis   . Coronary artery disease    SEVERE  . GERD (gastroesophageal reflux disease)   . GI bleed 12/2013  . Goiter   . Hypertension   . Myocardial infarction 08/2013  . Pancolonic diverticulosis 12/2013    Past Surgical History:  Procedure Laterality Date  . COLONOSCOPY N/A 01/13/2014   Procedure: COLONOSCOPY;  Surgeon: Beryle Beams, MD;  Location: [place];  Service: Endoscopy;  Laterality: N/A;  . COLONOSCOPY N/A 12/14/2015   Procedure: COLONOSCOPY;  Surgeon: Wilford Corner, MD;  Location: [facility] ENDOSCOPY;  Service: Endoscopy;  Laterality: N/A;  . CORNEAL TRANSPLANT    . CORONARY ANGIOPLASTY  08/2013  . ESOPHAGOGASTRODUODENOSCOPY N/A 12/14/2015   Procedure: ESOPHAGOGASTRODUODENOSCOPY (EGD);  Surgeon: Wilford Corner, MD;  Location: [facility] ENDOSCOPY;  Service: Endoscopy;  Laterality: N/A;  possible EGD, start with colon first  . HEEL SPUR EXCISION    . LEFT HEART CATHETERIZATION WITH CORONARY ANGIOGRAM N/A 08/23/2013   Procedure: LEFT HEART CATHETERIZATION WITH CORONARY ANGIOGRAM;  Surgeon: Peter M Martinique, MD;  Location: [facility] CATH LAB;  Service: Cardiovascular;  Laterality: N/A;  . PERCUTANEOUS CORONARY STENT INTERVENTION (PCI-S) N/A 08/24/2013   Procedure: PERCUTANEOUS CORONARY STENT INTERVENTION (PCI-S);  Surgeon: Peter M Martinique, MD;  Location: [facility] CATH LAB;  Service: Cardiovascular;  Laterality: N/A;  . TONSILLECTOMY    . TUBAL LIGATION       reports  that she has never smoked. She has never used smokeless tobacco. She reports that she does not drink alcohol or use drugs.  Allergies  Allergen Reactions  . Bayer Advanced Aspirin [Aspirin] Palpitations    Patient can tolerate "low dose" aspirin  . Codeine Other (See  Comments)    Makes her feel "high"    Family History  Problem Relation Age of Onset  . Hypertension Brother     Prior to Admission medications   Medication Sig Start Date End Date Taking? Authorizing Provider  aspirin EC 81 MG tablet Take 1 tablet (81 mg total) by mouth daily. Resume in 3 weeks. Patient taking differently: Take 81 mg by mouth daily.  01/06/16  Yes Eugenie Filler, MD  atorvastatin (LIPITOR) 40 MG tablet TAKE ONE TABLET BY MOUTH DAILY AT 6 PM. 03/21/15  Yes Herminio Commons, MD  b complex vitamins capsule Take 1 capsule by mouth daily.   Yes Historical Provider, MD  diclofenac (VOLTAREN) 75 MG EC tablet Take 75 mg by mouth daily as needed for mild pain.   Yes Historical Provider, MD  lisinopril (PRINIVIL,ZESTRIL) 20 MG tablet Take 1 tablet (20 mg total) by mouth daily. 12/16/15  Yes Eugenie Filler, MD  metoprolol succinate (TOPROL-XL) 25 MG 24 hr tablet Take 0.5 tablets (12.5 mg total) by mouth daily. 12/16/15  Yes Eugenie Filler, MD  nitroGLYCERIN (NITROSTAT) 0.4 MG SL tablet Place 1 tablet (0.4 mg total) under the tongue every 5 (five) minutes as needed for chest pain. 04/16/15  Yes Herminio Commons, MD  Omega-3 Fatty Acids (FISH OIL) 1000 MG CAPS Take 3,000 mg by mouth daily.    Yes Historical Provider, MD  pantoprazole (PROTONIX) 40 MG tablet Take 1 tablet (40 mg total) by mouth daily. 12/16/15  Yes Eugenie Filler, MD    Physical Exam:   Constitutional:Elderly female in NAD, calm, comfortable Vitals:   04/03/16 2059 04/03/16 2150  BP: 139/79 (!) 121/49  Pulse: 87 68  Resp: 20 16  Temp: 97.8 F (36.6 C) 97.9 F (36.6 C)  TempSrc:  Oral  SpO2: 100% 98%  Weight: 105.2 kg (232 lb)   Height: 5\' 4"  (1.626 m)    Eyes: PERRL, lids and conjunctivae normal ENMT: Mucous membranes are moist. Posterior pharynx clear of any exudate or lesions.Normal dentition.  Neck: normal, supple, no masses, no thyromegaly Respiratory: clear to auscultation bilaterally,  no wheezing, no crackles. Normal respiratory effort. No accessory muscle use.  Cardiovascular: Regular rate and rhythm, no murmurs / rubs / gallops. Trace pitting lower extremity edema. 2+ pedal pulses. No carotid bruits.  Abdomen: no tenderness, no masses palpated. No hepatosplenomegaly. Bowel sounds hyperactive. Musculoskeletal: no clubbing / cyanosis. No joint deformity upper and lower extremities. Good ROM, no contractures. Normal muscle tone.  Skin: no rashes, lesions, ulcers. No induration Neurologic: CN 2-12 grossly intact. Sensation intact, DTR normal. Strength 5/5 in all 4.  Psychiatric: Normal judgment and insight. Alert and oriented x 3. Normal mood.     Labs on Admission: I have personally reviewed following labs and imaging studies  CBC:  Recent Labs Lab 04/03/16 2126  WBC 8.1  NEUTROABS 5.0  HGB 9.4*  HCT 29.1*  MCV 93.3  PLT 016   Basic Metabolic Panel:  Recent Labs Lab 04/03/16 2126  NA 138  K 4.2  CL 110  CO2 23  GLUCOSE 155*  BUN 33*  CREATININE 1.41*  CALCIUM 8.9   GFR: Estimated Creatinine Clearance: 41.4 mL/min (by  C-G formula based on SCr of 1.41 mg/dL (H)). Liver Function Tests:  Recent Labs Lab 04/03/16 2126  AST 20  ALT 13*  ALKPHOS 79  BILITOT 0.4  PROT 6.8  ALBUMIN 3.3*   No results for input(s): LIPASE, AMYLASE in the last 168 hours. No results for input(s): AMMONIA in the last 168 hours. Coagulation Profile: No results for input(s): INR, PROTIME in the last 168 hours. Cardiac Enzymes: No results for input(s): CKTOTAL, CKMB, CKMBINDEX, TROPONINI in the last 168 hours. BNP (last 3 results) No results for input(s): PROBNP in the last 8760 hours. HbA1C: No results for input(s): HGBA1C in the last 72 hours. CBG: No results for input(s): GLUCAP in the last 168 hours. Lipid Profile: No results for input(s): CHOL, HDL, LDLCALC, TRIG, CHOLHDL, LDLDIRECT in the last 72 hours. Thyroid Function Tests: No results for input(s): TSH,  T4TOTAL, FREET4, T3FREE, THYROIDAB in the last 72 hours. Anemia Panel: No results for input(s): VITAMINB12, FOLATE, FERRITIN, TIBC, IRON, RETICCTPCT in the last 72 hours. Urine analysis:    Component Value Date/Time   COLORURINE YELLOW 09/01/2015 1918   APPEARANCEUR CLEAR 09/01/2015 1918   LABSPEC 1.025 09/01/2015 1918   PHURINE 6.0 09/01/2015 1918   GLUCOSEU NEGATIVE 09/01/2015 1918   HGBUR NEGATIVE 09/01/2015 1918   BILIRUBINUR NEGATIVE 09/01/2015 1918   KETONESUR NEGATIVE 09/01/2015 1918   PROTEINUR NEGATIVE 09/01/2015 1918   UROBILINOGEN 0.2 08/01/2014 0003   NITRITE NEGATIVE 09/01/2015 1918   LEUKOCYTESUR TRACE (A) 09/01/2015 1918   Sepsis Labs: No results found for this or any previous visit (from the past 240 hour(s)).   Radiological Exams on Admission: No results found.   Assessment/Plan GI bleed: Acute. Patient presents with 3 reports of rectal bleeding. Hemoglobin appears stable at 9.4 similar to previous blood counts prior to discharge in July with previous diverticular bleed. Given the elevated BUN of this could be a upper GI bleed. - Admit to a telemetry bed - clear liquid diet - Check H&H every 4 hours - IVF NS at 100 ml/hr - Held diclofenac and ibuprofen - Educated patient on Medications classified as NSAIDs - Protonix twice a day times first dose now ? possible upper GI bleed given increased BUN/ stomach activity  Anemia: Hemoglobin appears to be stable at 9.4 creatinine. - serial H&H  - T&S for possible need of blood products - Will transfuse if hemoglobin drops below 7-8 g/dL or patient becomes symptomatic   Essential hypertension - Continue lisinopril and metoprolol  Chronic kidney disease stage III: Baseline creatinine previously had been around 1.19- 1.33. On admission patient's creatinine elevated 1.41 with a BUN elevated at 33. - IV fluids as seen above - Recheck BMP in a.m.  GERD - Continue Protonix  DVT prophylaxis: SCDs Code  Status:Full Family Communication:  Discussed overall care with the patient and family present at bedside. Disposition Plan:   possible discharge home once medically stable Consults called: consult to GI in a.m.  Admission status:  Observation  Norval Morton MD [facility] Pager [phone]  If 7PM-7AM, please contact night-coverage [email or web address] Password [facility]  04/03/2016, 10:44 PM

## 2016-04-03 NOTE — ED Provider Notes (Signed)
[place] DEPT Provider Note   CSN: [id number] Arrival date & time: 04/03/16  2052  By signing my name below, I, Royce Macadamia, attest that this documentation has been prepared under the direction and in the presence of Davonna Belling, MD . Electronically Signed: Royce Macadamia, Scribe. 04/03/2016. 9:13 PM.  History   Chief Complaint Chief Complaint  Patient presents with  . Rectal Bleeding    Rectal Bleeding  Associated symptoms: no dizziness and no light-headedness      HPI Comments:  Shelly [name] is a 75 y.o. female with history of panocolonic diverticulitis and GI bleeding currently taking 81 mg Asprin daily who presents to the Emergency Department complaining of 2 episodes of gradually worsening blood in her stool today.  Her first episode was at 1300 today; pt reports she saw some blood in her stool.  At 1630 tonight she notes she felt like she needed to defecate again and when she did she noticed chunks of blood in her stool.  She denies blood in the toilet bowl.  Just PTA pt urinated and when she wiped there was blood on the tissue, but none in the bowl.  Pt's last bowel movement prior to these episodes was yesterday and she states there was no blood in it.  No alleviating factors noted.  She denies lightheadedness, dizziness, chest pain and LOC.     Past Medical History:  Diagnosis Date  . Anginal pain ([place])   . Arthritis   . Coronary artery disease    SEVERE  . GERD (gastroesophageal reflux disease)   . GI bleed 12/2013  . Goiter   . Hypertension   . Myocardial infarction 08/2013  . Pancolonic diverticulosis 12/2013    Patient Active Problem List   Diagnosis Date Noted  . Third degree heart block ([place]) 12/10/2015  . Rectal bleed 12/09/2015  . Lower GI bleeding 12/09/2015  . Syncope and collapse 12/09/2015  . Acute blood loss anemia 12/09/2015  . Pancolonic diverticulosis 12/09/2015  . GI bleeding 01/11/2014  . Rectal bleeding 01/11/2014  .  CAD (coronary artery disease) 01/11/2014  . NSTEMI (non-ST elevated myocardial infarction) ([place]) 08/23/2013  . Absolute anemia 08/23/2013  . Chest pain 08/22/2013  . Essential hypertension 08/22/2013  . GERD (gastroesophageal reflux disease) 08/22/2013  . Anxiety 08/22/2013    Past Surgical History:  Procedure Laterality Date  . COLONOSCOPY N/A 01/13/2014   Procedure: COLONOSCOPY;  Surgeon: Beryle Beams, MD;  Location: [place];  Service: Endoscopy;  Laterality: N/A;  . COLONOSCOPY N/A 12/14/2015   Procedure: COLONOSCOPY;  Surgeon: Wilford Corner, MD;  Location[facility] ENDOSCOPY;  Service: Endoscopy;  Laterality: N/A;  . CORNEAL TRANSPLANT    . CORONARY ANGIOPLASTY  08/2013  . ESOPHAGOGASTRODUODENOSCOPY N/A 12/14/2015   Procedure: ESOPHAGOGASTRODUODENOSCOPY (EGD);  Surgeon: Wilford Corner, MD;  Location: [facility] ENDOSCOPY;  Service: Endoscopy;  Laterality: N/A;  possible EGD, start with colon first  . HEEL SPUR EXCISION    . LEFT HEART CATHETERIZATION WITH CORONARY ANGIOGRAM N/A 08/23/2013   Procedure: LEFT HEART CATHETERIZATION WITH CORONARY ANGIOGRAM;  Surgeon: Peter M Martinique, MD;  Location: [facility] CATH LAB;  Service: Cardiovascular;  Laterality: N/A;  . PERCUTANEOUS CORONARY STENT INTERVENTION (PCI-S) N/A 08/24/2013   Procedure: PERCUTANEOUS CORONARY STENT INTERVENTION (PCI-S);  Surgeon: Peter M Martinique, MD;  Location: [facility] CATH LAB;  Service: Cardiovascular;  Laterality: N/A;  . TONSILLECTOMY    . TUBAL LIGATION      OB History    No data available  Home Medications    Prior to Admission medications   Medication Sig Start Date End Date Taking? Authorizing Provider  aspirin EC 81 MG tablet Take 1 tablet (81 mg total) by mouth daily. Resume in 3 weeks. Patient taking differently: Take 81 mg by mouth daily.  01/06/16  Yes Eugenie Filler, MD  atorvastatin (LIPITOR) 40 MG tablet TAKE ONE TABLET BY MOUTH DAILY AT 6 PM. 03/21/15  Yes Herminio Commons, MD  b complex vitamins capsule  Take 1 capsule by mouth daily.   Yes Historical Provider, MD  diclofenac (VOLTAREN) 75 MG EC tablet Take 75 mg by mouth daily as needed for mild pain.   Yes Historical Provider, MD  lisinopril (PRINIVIL,ZESTRIL) 20 MG tablet Take 1 tablet (20 mg total) by mouth daily. 12/16/15  Yes Eugenie Filler, MD  metoprolol succinate (TOPROL-XL) 25 MG 24 hr tablet Take 0.5 tablets (12.5 mg total) by mouth daily. 12/16/15  Yes Eugenie Filler, MD  nitroGLYCERIN (NITROSTAT) 0.4 MG SL tablet Place 1 tablet (0.4 mg total) under the tongue every 5 (five) minutes as needed for chest pain. 04/16/15  Yes Herminio Commons, MD  Omega-3 Fatty Acids (FISH OIL) 1000 MG CAPS Take 3,000 mg by mouth daily.    Yes Historical Provider, MD  pantoprazole (PROTONIX) 40 MG tablet Take 1 tablet (40 mg total) by mouth daily. 12/16/15  Yes Eugenie Filler, MD    Family History Family History  Problem Relation Age of Onset  . Hypertension Brother     Social History Social History  Substance Use Topics  . Smoking status: Never Smoker  . Smokeless tobacco: Never Used  . Alcohol use No     Allergies   Bayer advanced aspirin [aspirin] and Codeine   Review of Systems Review of Systems  Cardiovascular: Negative for chest pain.  Gastrointestinal: Positive for blood in stool and hematochezia.  Neurological: Negative for dizziness, syncope and light-headedness.  All other systems reviewed and are negative.    Physical Exam Updated Vital Signs BP (!) 121/49 (BP Location: Left Arm)   Pulse 68   Temp 97.9 F (36.6 C) (Oral)   Resp 16   Ht 5\' 4"  (1.626 m)   Wt 232 lb (105.2 kg)   SpO2 98%   BMI 39.82 kg/m   Physical Exam  Constitutional: She is oriented to person, place, and time. She appears well-developed and well-nourished. No distress.  HENT:  Head: Normocephalic and atraumatic.  Eyes: Conjunctivae are normal.  Pulmonary/Chest: Effort normal.  Abdominal: Soft. There is no tenderness.    Musculoskeletal: She exhibits edema (BLE).  Neurological: She is alert and oriented to person, place, and time.  Skin: Skin is warm and dry. No pallor.  Psychiatric: She has a normal mood and affect.  Nursing note and vitals reviewed.    ED Treatments / Results   COORDINATION OF CARE:  9:11 PM Discussed treatment plan with pt at bedside and pt agreed to plan.  Labs (all labs ordered are listed, but only abnormal results are displayed) Labs Reviewed  COMPREHENSIVE METABOLIC PANEL - Abnormal; Notable for the following:       Result Value   Glucose, Bld 155 (*)    BUN 33 (*)    Creatinine, Ser 1.41 (*)    Albumin 3.3 (*)    ALT 13 (*)    GFR calc non Af Amer 36 (*)    GFR calc Af Amer 41 (*)    All other components within  normal limits  CBC WITH DIFFERENTIAL/PLATELET - Abnormal; Notable for the following:    RBC 3.12 (*)    Hemoglobin 9.4 (*)    HCT 29.1 (*)    All other components within normal limits  POC OCCULT BLOOD, ED  SAMPLE TO BLOOD BANK    EKG  EKG Interpretation None       Radiology No results found.  Procedures Procedures (including critical care time)  Medications Ordered in ED Medications - No data to display   Initial Impression / Assessment and Plan / ED Course  I have reviewed the triage vital signs and the nursing notes.  Pertinent labs & imaging results that were available during my care of the patient were reviewed by me and considered in my medical decision making (see chart for details).  Clinical Course     9:11 PM  Informed pt that we would need to perform a rectal exam or collect a stool sample tonight.    Patient presents with GI bleeding. History of same and required 7 units of blood. Also had third-degree block at that time. Had diverticular bleeding. Hemoglobin is at her baseline now but her BUN is elevated. Rectal exam showed Frank purplish blood. Patient appears well but with the recent severe bleeding and thinks she would  benefit from an observation. she is not on anticoagulation.  Final Clinical Impressions(s) / ED Diagnoses   Final diagnoses:  Acute GI bleeding    New Prescriptions New Prescriptions   No medications on file   I personally performed the services described in this documentation, which was scribed in my presence. The recorded information has been reviewed and is accurate.       Davonna Belling, MD 04/03/16 2238

## 2016-04-04 ENCOUNTER — Observation Stay (HOSPITAL_COMMUNITY): Payer: Medicare Other

## 2016-04-04 ENCOUNTER — Encounter (HOSPITAL_COMMUNITY): Payer: Self-pay

## 2016-04-04 DIAGNOSIS — N183 Chronic kidney disease, stage 3 unspecified: Secondary | ICD-10-CM | POA: Diagnosis present

## 2016-04-04 DIAGNOSIS — D62 Acute posthemorrhagic anemia: Secondary | ICD-10-CM

## 2016-04-04 DIAGNOSIS — I10 Essential (primary) hypertension: Secondary | ICD-10-CM | POA: Diagnosis not present

## 2016-04-04 DIAGNOSIS — K219 Gastro-esophageal reflux disease without esophagitis: Secondary | ICD-10-CM

## 2016-04-04 DIAGNOSIS — M1990 Unspecified osteoarthritis, unspecified site: Secondary | ICD-10-CM | POA: Diagnosis not present

## 2016-04-04 DIAGNOSIS — K5730 Diverticulosis of large intestine without perforation or abscess without bleeding: Secondary | ICD-10-CM

## 2016-04-04 DIAGNOSIS — N184 Chronic kidney disease, stage 4 (severe): Secondary | ICD-10-CM | POA: Diagnosis present

## 2016-04-04 DIAGNOSIS — D649 Anemia, unspecified: Secondary | ICD-10-CM | POA: Diagnosis present

## 2016-04-04 DIAGNOSIS — K922 Gastrointestinal hemorrhage, unspecified: Secondary | ICD-10-CM

## 2016-04-04 LAB — BASIC METABOLIC PANEL
Anion gap: 5 (ref 5–15)
BUN: 31 mg/dL — AB (ref 6–20)
CO2: 22 mmol/L (ref 22–32)
CREATININE: 1.14 mg/dL — AB (ref 0.44–1.00)
Calcium: 8.7 mg/dL — ABNORMAL LOW (ref 8.9–10.3)
Chloride: 113 mmol/L — ABNORMAL HIGH (ref 101–111)
GFR calc Af Amer: 54 mL/min — ABNORMAL LOW (ref >60)
GFR, EST NON AFRICAN AMERICAN: 46 mL/min — AB (ref >60)
Glucose, Bld: 104 mg/dL — ABNORMAL HIGH (ref 65–99)
Potassium: 4 mmol/L (ref 3.5–5.1)
SODIUM: 140 mmol/L (ref 135–145)

## 2016-04-04 LAB — TYPE AND SCREEN
ABO/RH(D): O POS
Antibody Screen: NEGATIVE

## 2016-04-04 LAB — CBC
HCT: 26.2 % — ABNORMAL LOW (ref 36.0–46.0)
Hemoglobin: 8.2 g/dL — ABNORMAL LOW (ref 12.0–15.0)
MCH: 29.7 pg (ref 26.0–34.0)
MCHC: 31.3 g/dL (ref 30.0–36.0)
MCV: 94.9 fL (ref 78.0–100.0)
PLATELETS: 237 10*3/uL (ref 150–400)
RBC: 2.76 MIL/uL — AB (ref 3.87–5.11)
RDW: 14.1 % (ref 11.5–15.5)
WBC: 6.8 10*3/uL (ref 4.0–10.5)

## 2016-04-04 LAB — HEMOGLOBIN AND HEMATOCRIT, BLOOD
HCT: 28.3 % — ABNORMAL LOW (ref 36.0–46.0)
Hemoglobin: 9 g/dL — ABNORMAL LOW (ref 12.0–15.0)

## 2016-04-04 MED ORDER — DICLOFENAC SODIUM 1 % TD GEL
4.0000 g | Freq: Three times a day (TID) | TRANSDERMAL | Status: DC
  Administered 2016-04-04: 4 g via TOPICAL
  Filled 2016-04-04: qty 100

## 2016-04-04 NOTE — Progress Notes (Signed)
Patient requesting Voltaren gel for her LEFT knee arthritis pain. MD notified with request.

## 2016-04-04 NOTE — Progress Notes (Signed)
Asked patient if this writer could apply Voltaren gel to patient's LEFT knee as ordered and patient stated "No I got it, leave it here." This Probation officer responded "No mam it's ok I can come back." The patient then stated as she pointed her spoon at me angrily "I'm not asking my guests to leave right now and I'm going to finish eating my jello first." This writer stated "No problem, I will come back shortly. Thank you."

## 2016-04-04 NOTE — Consult Note (Signed)
Referring Provider: Hospitalist group Primary Care Physician:  [name], MD Primary Gastroenterologist:  Dr. Valinda Hoar (previously none)  Date of Admission: 04/03/16 Date of Consultation: 04/04/16  Reason for Consultation:  GI bleed, anemia  HPI:  Shelly [name] is a 75 y.o. female with a past medical history of Arthritis, severe CAD, GERD GI bleed, MI, pancolonic diverticulosis. She was previously admitted to [facility] from 12/09/15 through 12/16/15 for lower GI bleed and anemia (hgb low 8.4). She underwent EGD and colonoscopy on 12/14/15 by Dr. Michail Sermon with normal EGD and colonoscopy showing pancolonic diverticulosis, residual blood from transverse colon to rectum but no active bleeding; presumed self-limiting diverticular bleed. She did require transfusion during her hospitalization.   Per ER notes at some point recently she was started on po Diclofenac in addition to her nromal daily 81 mg ASA. She presented to the ER on 04/03/16 complaining of 2 episodes of worsening brbpr in her stools. Initial episode 1300 and subsequently 1630 with "chunks of blood in her stool." No dizziness, chest pain, dyspnea. Her hgb in the ER was 9.4, Cr 1.41 (baseline 1.2 - 1.3). She was admitted for further evaluation.  Today she confirms the above history. Denies abdominal pain, N/V, diarrhea. Had a bowel movement just prior to my visit which was still in the commode and noted to have maroon-tinted water and formed stool. Patient states this was the least amount of blood she's had since she started having heme+ stools a couple days ago. Denies chest pain and dyspnea at this time. No other upper or lower GI complaints at this time.  Labs today show decline in hgb to 8.2, likely part hemodilution with IVF administration. A recheck of H/H later in the morning showed Hgb 9.0.  Past Medical History:  Diagnosis Date  . Anginal pain ([place])   . Arthritis   . Coronary artery disease    SEVERE  . GERD  (gastroesophageal reflux disease)   . GI bleed 12/2013  . Goiter   . Hypertension   . Myocardial infarction 08/2013  . Pancolonic diverticulosis 12/2013    Past Surgical History:  Procedure Laterality Date  . COLONOSCOPY N/A 01/13/2014   Procedure: COLONOSCOPY;  Surgeon: Beryle Beams, MD;  Location: [place];  Service: Endoscopy;  Laterality: N/A;  . COLONOSCOPY N/A 12/14/2015   Procedure: COLONOSCOPY;  Surgeon: Wilford Corner, MD;  Location: [facility] ENDOSCOPY;  Service: Endoscopy;  Laterality: N/A;  . CORNEAL TRANSPLANT    . CORONARY ANGIOPLASTY  08/2013  . ESOPHAGOGASTRODUODENOSCOPY N/A 12/14/2015   Procedure: ESOPHAGOGASTRODUODENOSCOPY (EGD);  Surgeon: Wilford Corner, MD;  Location: [facility] ENDOSCOPY;  Service: Endoscopy;  Laterality: N/A;  possible EGD, start with colon first  . HEEL SPUR EXCISION    . LEFT HEART CATHETERIZATION WITH CORONARY ANGIOGRAM N/A 08/23/2013   Procedure: LEFT HEART CATHETERIZATION WITH CORONARY ANGIOGRAM;  Surgeon: Peter M Martinique, MD;  Location: [facility] CATH LAB;  Service: Cardiovascular;  Laterality: N/A;  . PERCUTANEOUS CORONARY STENT INTERVENTION (PCI-S) N/A 08/24/2013   Procedure: PERCUTANEOUS CORONARY STENT INTERVENTION (PCI-S);  Surgeon: Peter M Martinique, MD;  Location: [facility] CATH LAB;  Service: Cardiovascular;  Laterality: N/A;  . TONSILLECTOMY    . TUBAL LIGATION      Prior to Admission medications   Medication Sig Start Date End Date Taking? Authorizing Provider  aspirin EC 81 MG tablet Take 1 tablet (81 mg total) by mouth daily. Resume in 3 weeks. Patient taking differently: Take 81 mg by mouth daily.  01/06/16  Yes Malachy Moan  Grandville Silos, MD  atorvastatin (LIPITOR) 40 MG tablet TAKE ONE TABLET BY MOUTH DAILY AT 6 PM. 03/21/15  Yes Herminio Commons, MD  b complex vitamins capsule Take 1 capsule by mouth daily.   Yes Historical Provider, MD  diclofenac (VOLTAREN) 75 MG EC tablet Take 75 mg by mouth daily as needed for mild pain.   Yes Historical Provider, MD   lisinopril (PRINIVIL,ZESTRIL) 20 MG tablet Take 1 tablet (20 mg total) by mouth daily. 12/16/15  Yes Eugenie Filler, MD  metoprolol succinate (TOPROL-XL) 25 MG 24 hr tablet Take 0.5 tablets (12.5 mg total) by mouth daily. 12/16/15  Yes Eugenie Filler, MD  nitroGLYCERIN (NITROSTAT) 0.4 MG SL tablet Place 1 tablet (0.4 mg total) under the tongue every 5 (five) minutes as needed for chest pain. 04/16/15  Yes Herminio Commons, MD  Omega-3 Fatty Acids (FISH OIL) 1000 MG CAPS Take 3,000 mg by mouth daily.    Yes Historical Provider, MD  pantoprazole (PROTONIX) 40 MG tablet Take 1 tablet (40 mg total) by mouth daily. 12/16/15  Yes Eugenie Filler, MD    Current Facility-Administered Medications  Medication Dose Route Frequency Provider Last Rate Last Dose  . 0.9 %  sodium chloride infusion   Intravenous Continuous Rexene Alberts, MD 75 mL/hr at 04/04/16 1238    . 0.9 %  sodium chloride infusion   Intravenous Once Norval Morton, MD      . acetaminophen (TYLENOL) tablet 650 mg  650 mg Oral Q6H PRN Norval Morton, MD       Or  . acetaminophen (TYLENOL) suppository 650 mg  650 mg Rectal Q6H PRN Rondell A Tamala Julian, MD      . albuterol (PROVENTIL) (2.5 MG/3ML) 0.083% nebulizer solution 2.5 mg  2.5 mg Nebulization Q2H PRN Norval Morton, MD      . atorvastatin (LIPITOR) tablet 40 mg  40 mg Oral q1800 Rondell A Tamala Julian, MD      . diclofenac sodium (VOLTAREN) 1 % transdermal gel 4 g  4 g Topical TID Rexene Alberts, MD      . lisinopril (PRINIVIL,ZESTRIL) tablet 20 mg  20 mg Oral Daily Norval Morton, MD   20 mg at 04/04/16 0855  . metoprolol succinate (TOPROL-XL) 24 hr tablet 12.5 mg  12.5 mg Oral Daily Norval Morton, MD   12.5 mg at 04/04/16 0854  . nitroGLYCERIN (NITROSTAT) SL tablet 0.4 mg  0.4 mg Sublingual Q5 min PRN Norval Morton, MD      . ondansetron (ZOFRAN) tablet 4 mg  4 mg Oral Q6H PRN Norval Morton, MD       Or  . ondansetron (ZOFRAN) injection 4 mg  4 mg Intravenous Q6H PRN Rondell  A Tamala Julian, MD      . pantoprazole (PROTONIX) EC tablet 40 mg  40 mg Oral BID Norval Morton, MD   40 mg at 04/04/16 0855  . sodium chloride flush (NS) 0.9 % injection 3 mL  3 mL Intravenous Q12H Norval Morton, MD   3 mL at 04/04/16 0133    Allergies as of 04/03/2016 - Review Complete 04/03/2016  Allergen Reaction Noted  . Bayer advanced aspirin [aspirin] Palpitations 08/22/2013  . Codeine Other (See Comments)     Family History  Problem Relation Age of Onset  . Hypertension Brother     Social History   Social History  . Marital status: Single    Spouse name: N/A  . Number of children:  N/A  . Years of education: N/A   Occupational History  . Not on file.   Social History Main Topics  . Smoking status: Never Smoker  . Smokeless tobacco: Never Used  . Alcohol use No  . Drug use: No  . Sexual activity: No   Other Topics Concern  . Not on file   Social History Narrative  . No narrative on file    Review of Systems: General: Negative for anorexia, weight loss, fever, chills, fatigue, weakness. ENT: Negative for hoarseness, difficulty swallowing. CV: Negative for chest pain, angina, palpitations, peripheral edema.  Respiratory: Negative for dyspnea at rest, cough, sputum, wheezing.  GI: See history of present illness. MS: Admits chronic arthritis pain.  Derm: Negative for rash or itching.  Endo: Negative for unusual weight change.  Heme: Negative for bruising or bleeding. Allergy: Negative for rash or hives.  Physical Exam: Vital signs in last 24 hours: Temp:  [97.4 F (36.3 C)-98 F (36.7 C)] 97.4 F (36.3 C) (11/17 1309) Pulse Rate:  [65-87] 65 (11/17 1309) Resp:  [16-20] 20 (11/17 1309) BP: (117-139)/(49-79) 124/53 (11/17 1309) SpO2:  [96 %-100 %] 100 % (11/17 1309) Weight:  [232 lb (105.2 kg)-232 lb 12.8 oz (105.6 kg)] 232 lb 12.8 oz (105.6 kg) (11/17 0000) Last BM Date: 04/04/16 General:   Alert,  Well-developed, well-nourished, pleasant and cooperative  in NAD Head:  Normocephalic and atraumatic. Eyes:  Sclera clear, no icterus. Conjunctiva pink. Ears:  Normal auditory acuity. Lungs:  Clear throughout to auscultation. No wheezes, crackles, or rhonchi. No acute distress. Heart:  Regular rate and rhythm; no murmurs, clicks, rubs,  or gallops. Abdomen:  Rounded but soft, nontender and nondistended. No masses, hepatosplenomegaly or hernias noted. Normal bowel sounds, without guarding, and without rebound.   Rectal:  Deferred until time of colonoscopy.   Msk:  Symmetrical without gross deformities. Pulses:  Normal bilateral DP pulses noted. Extremities:  Without clubbing or edema. Neurologic:  Alert and  oriented x4;  grossly normal neurologically. Psych:  Alert and cooperative. Normal mood and affect.  Intake/Output from previous day: 11/16 0701 - 11/17 0700 In: 774.7 [P.O.:240; I.V.:534.7] Out: 600 [Urine:600] Intake/Output this shift: Total I/O In: 720 [P.O.:720] Out: 1300 [Urine:1300]  Lab Results:  Recent Labs  04/03/16 2126 04/04/16 0525 04/04/16 0948  WBC 8.1 6.8  --   HGB 9.4* 8.2* 9.0*  HCT 29.1* 26.2* 28.3*  PLT 252 237  --    BMET  Recent Labs  04/03/16 2126 04/04/16 0525  NA 138 140  K 4.2 4.0  CL 110 113*  CO2 23 22  GLUCOSE 155* 104*  BUN 33* 31*  CREATININE 1.41* 1.14*  CALCIUM 8.9 8.7*   LFT  Recent Labs  04/03/16 2126  PROT 6.8  ALBUMIN 3.3*  AST 20  ALT 13*  ALKPHOS 79  BILITOT 0.4   PT/INR No results for input(s): LABPROT, INR in the last 72 hours. Hepatitis Panel No results for input(s): HEPBSAG, HCVAB, HEPAIGM, HEPBIGM in the last 72 hours. C-Diff No results for input(s): CDIFFTOX in the last 72 hours.  Studies/Results: No results found.  Impression: 75 year old female with recurrent brbpr which appears to be decreasing in amount. Hgb has essentially been stable and last noted 9.0. No symptoms of anemia. Hgb appears stable/starting to improve. Recent EGD/TCS with no significant  findings other than diverticulosis. Suspect this is recurrent diverticular bleed in part due to starting diclofenac on top of ASA 81. She states this is her  3rd incidence of GI bleed. She does not have a primary GI. No other upper or lower GI symptoms. Is wanting to avoid further endoscopic evaluation. Symptomatically improving overall.  Plan: 1. Monitor status of GI bleed, anticipate self-resolution shortly 2. Monitor H/H closely for decline 3. Transfuse as necessary 4. Supportive measures. 5. No need for IP endoscopic evaluation at this time; this could change if there's a hange in her clinical condition   Thank you for allowing [place] to participate in the care of Shelly Childes, DNP, AGNP-C Adult & Gerontological Nurse Practitioner [facility]    LOS: 0 days     04/04/2016, 2:56 PM

## 2016-04-04 NOTE — Care Management Obs Status (Signed)
[place] NOTIFICATION   Patient Details  Name: Shelly [name] MRN: [id number] Date of Birth: 11-May-1941   Medicare Observation Status Notification Given:  Yes    Mateo Overbeck, Chauncey Reading, RN 04/04/2016, 9:40 AM

## 2016-04-04 NOTE — Care Management Note (Signed)
Case Management Note  Patient Details  Name: Shelly [name] MRN: [id number] Date of Birth: 1940-07-16  Subjective/Objective:  Patient adm from home with GI Bleed. Ind with ADL's, lives with daughter and 2 grandchildren. Has PCP, transportation and reports no issues affording medications.                   Action/Plan: Anticipate DC home with self care.   Expected Discharge Date:    04/05/2016              Expected Discharge Plan:  Home/Self Care  In-House Referral:  NA  Discharge planning Services  CM Consult  Post Acute Care Choice:  NA Choice offered to:  NA  DME Arranged:    DME Agency:     HH Arranged:    HH Agency:     Status of Service:  Completed, signed off  If discussed at [employer] of Stay Meetings, dates discussed:    Additional Comments:  Timmey Lamba, Chauncey Reading, RN 04/04/2016, 9:38 AM

## 2016-04-04 NOTE — Progress Notes (Signed)
PROGRESS NOTE    Shelly [name]  WRU:[id number] DOB: [birth year] DOA: 04/03/2016 PCP: [name], MD    Brief Narrative:  Shelly [name] is a 75 y.o. female with medical history significant of HTN, MI, diverticulosis, GI bleed, GERD, and arthritis; who presented for complaints of blood in her stool.She was recently prescribed diclofenac and had been taking it intermittently for arthritis pain. Patient does take a baby aspirin. She was recently hospitalized at [facility] in July 2017 for a lower GI bleed. She was transfused over 7 units of blood during that hospital stay in the hospital. It was thought to be related to a diverticular bleed. The EGD revealed no acute abnormalities. Colonoscopy revealed diverticulosis of the entire colon with internal hemorrhoids. In the ED, her vital signs were stable. Her labs were remarkable for a hemoglobin 9.4, BUN 33, creatinine 1.41, and all other labs were relatively within normal limits. The ED physician performed a rectal exam and noted blood in the rectal vault. She was admitted for further evaluation and management.  Assessment & Plan:   Principal Problem:   Lower GI bleeding Active Problems:   Acute blood loss anemia   Pancolonic diverticulosis   Essential hypertension   GERD (gastroesophageal reflux disease)   CKD (chronic kidney disease), stage III   Osteoarthritis   1. GI bleeding, presumed to be lower GI bleeding secondary to diverticulosis/diverticular bleeding. Patient has a history of presumed diverticular bleeding in July 2017. It is likely the same etiology of her recurrent bleeding in the setting of NSAID use. -She was restarted on Protonix. GI has been consulted. The tentative plan is to monitor her and if the bleeding continues, Givens capsule will be considered. -We'll continue clear liquid diet until GI says otherwise.  Acute blood loss anemia. Patient apparently was transfused 7 units of packed red blood cells  during her hospitalization in July 2017. On admission, her hemoglobin was 9.4. It fell to 8.2, possibly from the hemodilution effects of IV fluids and equilibration. Repeat hemoglobin was 9.0. -We'll continue to monitor and decrease IV fluid rate little. No indication for blood transfusion currently.  Essential hypertension. Patient is treated chronically with lisinopril 20 mg daily and Toprol-XL 12.5 mg daily. Both were continued. Her blood pressure has been stable and controlled.  Stage II to stage III chronic kidney disease. Patient's creatinine was 1.41 on admission. Her baseline creatinine is 1.2-1.3. With volume repletion from IV fluids, her creatinine has improved 1.14.  Osteoarthritis of knees. Patient had been taken oral diclofenac. She was instructed to stop taking this. Will order topical Voltaren instead.   DVT prophylaxis: SCDs Code Status: Full code Family Communication: Discussed with patient, family not available Disposition Plan: Discharge with clinically appropriate   Consultants:   Gastroenterology  Procedures:   None  Antimicrobials:  None    Subjective: Patient reported a maroon-like stool earlier this morning. She denies abdominal cramping.  Objective: Vitals:   04/03/16 2150 04/03/16 2300 04/04/16 0000 04/04/16 0436  BP: (!) 121/49 127/64 (!) 117/53 127/62  Pulse: 68 72 74 80  Resp: 16 19 20 20   Temp: 97.9 F (36.6 C)  98 F (36.7 C) 97.8 F (36.6 C)  TempSrc: Oral  Oral Oral  SpO2: 98% 96% 100% 100%  Weight:   105.6 kg (232 lb 12.8 oz)   Height:        Intake/Output Summary (Last 24 hours) at 04/04/16 1236 Last data filed at 04/04/16 1008  Gross per 24 hour  Intake           774.67 ml  Output             1450 ml  Net          -675.33 ml   Filed Weights   04/03/16 2059 04/04/16 0000  Weight: 105.2 kg (232 lb) 105.6 kg (232 lb 12.8 oz)    Examination:  General exam: Appears calm and comfortable  Respiratory system: Clear to  auscultation. Respiratory effort normal. Cardiovascular system: S1 & S2 heard, with a soft systolic murmur. No pedal edema. Gastrointestinal system: Abdomen is obese, nondistended, soft and nontender. No organomegaly or masses felt. Normal bowel sounds heard. Central nervous system: Alert and oriented. No focal neurological deficits. Extremities/musculoskeletal: Mild tenderness of palpation of both knees right greater than left, but without effusion warmth or acute erythema. Symmetric 5 x 5 power. Skin: No rashes, lesions or ulcers Psychiatry: Judgement and insight appear normal. Mood & affect appropriate.     Data Reviewed: I have personally reviewed following labs and imaging studies  CBC:  Recent Labs Lab 04/03/16 2126 04/04/16 0525 04/04/16 0948  WBC 8.1 6.8  --   NEUTROABS 5.0  --   --   HGB 9.4* 8.2* 9.0*  HCT 29.1* 26.2* 28.3*  MCV 93.3 94.9  --   PLT 252 237  --    Basic Metabolic Panel:  Recent Labs Lab 04/03/16 2126 04/04/16 0525  NA 138 140  K 4.2 4.0  CL 110 113*  CO2 23 22  GLUCOSE 155* 104*  BUN 33* 31*  CREATININE 1.41* 1.14*  CALCIUM 8.9 8.7*   GFR: Estimated Creatinine Clearance: 51.3 mL/min (by C-G formula based on SCr of 1.14 mg/dL (H)). Liver Function Tests:  Recent Labs Lab 04/03/16 2126  AST 20  ALT 13*  ALKPHOS 79  BILITOT 0.4  PROT 6.8  ALBUMIN 3.3*   No results for input(s): LIPASE, AMYLASE in the last 168 hours. No results for input(s): AMMONIA in the last 168 hours. Coagulation Profile: No results for input(s): INR, PROTIME in the last 168 hours. Cardiac Enzymes: No results for input(s): CKTOTAL, CKMB, CKMBINDEX, TROPONINI in the last 168 hours. BNP (last 3 results) No results for input(s): PROBNP in the last 8760 hours. HbA1C: No results for input(s): HGBA1C in the last 72 hours. CBG: No results for input(s): GLUCAP in the last 168 hours. Lipid Profile: No results for input(s): CHOL, HDL, LDLCALC, TRIG, CHOLHDL, LDLDIRECT  in the last 72 hours. Thyroid Function Tests: No results for input(s): TSH, T4TOTAL, FREET4, T3FREE, THYROIDAB in the last 72 hours. Anemia Panel: No results for input(s): VITAMINB12, FOLATE, FERRITIN, TIBC, IRON, RETICCTPCT in the last 72 hours. Sepsis Labs: No results for input(s): PROCALCITON, LATICACIDVEN in the last 168 hours.  No results found for this or any previous visit (from the past 240 hour(s)).       Radiology Studies: No results found.      Scheduled Meds: . sodium chloride   Intravenous Once  . atorvastatin  40 mg Oral q1800  . lisinopril  20 mg Oral Daily  . metoprolol succinate  12.5 mg Oral Daily  . pantoprazole  40 mg Oral BID  . sodium chloride flush  3 mL Intravenous Q12H   Continuous Infusions: . sodium chloride 100 mL/hr at 04/04/16 1151     LOS: 0 days    Time spent: 24 minutes    Rexene Alberts, MD [facility] Pager [phone]  If 7PM-7AM, please contact night-coverage  [email or web address] Password [facility] 04/04/2016, 12:36 PM

## 2016-04-05 DIAGNOSIS — D62 Acute posthemorrhagic anemia: Secondary | ICD-10-CM | POA: Diagnosis not present

## 2016-04-05 DIAGNOSIS — N183 Chronic kidney disease, stage 3 (moderate): Secondary | ICD-10-CM | POA: Diagnosis not present

## 2016-04-05 DIAGNOSIS — I10 Essential (primary) hypertension: Secondary | ICD-10-CM | POA: Diagnosis not present

## 2016-04-05 DIAGNOSIS — K922 Gastrointestinal hemorrhage, unspecified: Secondary | ICD-10-CM | POA: Diagnosis not present

## 2016-04-05 LAB — CBC
HEMATOCRIT: 25 % — AB (ref 36.0–46.0)
Hemoglobin: 8.1 g/dL — ABNORMAL LOW (ref 12.0–15.0)
MCH: 30.6 pg (ref 26.0–34.0)
MCHC: 32.4 g/dL (ref 30.0–36.0)
MCV: 94.3 fL (ref 78.0–100.0)
PLATELETS: 233 10*3/uL (ref 150–400)
RBC: 2.65 MIL/uL — ABNORMAL LOW (ref 3.87–5.11)
RDW: 14.8 % (ref 11.5–15.5)
WBC: 5.6 10*3/uL (ref 4.0–10.5)

## 2016-04-05 LAB — BASIC METABOLIC PANEL
ANION GAP: 3 — AB (ref 5–15)
BUN: 25 mg/dL — ABNORMAL HIGH (ref 6–20)
CALCIUM: 8.7 mg/dL — AB (ref 8.9–10.3)
CO2: 23 mmol/L (ref 22–32)
Chloride: 115 mmol/L — ABNORMAL HIGH (ref 101–111)
Creatinine, Ser: 1.12 mg/dL — ABNORMAL HIGH (ref 0.44–1.00)
GFR, EST AFRICAN AMERICAN: 55 mL/min — AB (ref >60)
GFR, EST NON AFRICAN AMERICAN: 47 mL/min — AB (ref >60)
Glucose, Bld: 96 mg/dL (ref 65–99)
Potassium: 3.9 mmol/L (ref 3.5–5.1)
SODIUM: 141 mmol/L (ref 135–145)

## 2016-04-05 MED ORDER — FERROUS SULFATE 325 (65 FE) MG PO TABS: 325.0000 mg | ORAL_TABLET | Freq: Two times a day (BID) | ORAL | Status: DC

## 2016-04-05 MED ORDER — DICLOFENAC SODIUM 1 % TD GEL: 4.0000 g | Freq: Three times a day (TID) | TRANSDERMAL | 2 refills | Status: DC | PRN

## 2016-04-05 MED ORDER — ASPIRIN EC 81 MG PO TBEC: 81.0000 mg | DELAYED_RELEASE_TABLET | Freq: Every day | ORAL | Status: DC

## 2016-04-05 MED ORDER — HYDROCODONE-ACETAMINOPHEN 5-325 MG PO TABS: 1.0000 | ORAL_TABLET | Freq: Two times a day (BID) | ORAL | 0 refills | Status: DC | PRN

## 2016-04-05 NOTE — Discharge Summary (Signed)
Physician Discharge Summary  Shelly [name] NFA:[id number] DOB: [birth year] DOA: 04/03/2016  PCP: [name], MD  Admit date: 04/03/2016 Discharge date: 04/05/2016  Time spent: Greater than 30 minutes  Recommendations for Outpatient Follow-up:  1. Recommend follow-up of the patient's hematocrit and hemoglobin.  2. Patient was instructed to avoid all NSAIDs with exception of 81 mg aspirin.    Discharge Diagnoses:  1. Lower GI bleeding, thought to be secondary to recurrent diverticular bleeding in the setting of NSAID use. 2. Acute blood loss anemia. 3. Pancolonic diverticulosis. 4. Degenerative joint disease of both knees. 5. Essential hypertension. 6. Stage 2 to stage III chronic kidney disease. 7. GERD.  Discharge Condition: Improved.  Diet recommendation: Heart healthy.  Filed Weights   04/03/16 2059 04/04/16 0000  Weight: 105.2 kg (232 lb) 105.6 kg (232 lb 12.8 oz)    History of present illness:  Shelly [name] a 75 y.o.femalewith medical history significant of HTN, MI, diverticulosis, GI bleed, GERD, and arthritis; who presented for complaints of blood in her stool. She was recently prescribed diclofenac and had been taking it intermittently for arthritis pain. Patient does also take a baby aspirin. She was recently hospitalized at [facility] in July 2017 for a lower GI bleed. She was transfused over 7 units of blood during that hospital stay in the hospital. It was thought to be related to a diverticular bleed. The EGD revealed no acute abnormalities. Colonoscopy revealed diverticulosis of the entire colon with internal hemorrhoids. In the ED, her vital signs were stable. Her labs were remarkable for a hemoglobin 9.4, BUN 33, creatinine 1.41, and all other labs were relatively within normal limits. The ED physician performed a rectal exam and noted blood in the rectal vault. She was admitted for further evaluation and management.  Hospital Course:  1. GI  bleeding, presumed to be lower GI bleeding secondary to diverticulosis/diverticular bleeding. Patient has a history of presumed diverticular bleeding in July 2017.  -She was restarted on Protonix. Oral Voltaren was discontinued. GI has been consulted. The tentative plan was to monitor her and if the bleeding continued, Givens capsule would be considered. -Her diet was advanced to clear liquids and then to solid foods which she tolerated well. -She had no further bloody stools. She was instructed to avoid all NSAIDs with exception of an 81 mg aspirin daily. Gastroenterologist Dr. Oneida Alar recommended that the patient restart aspirin on 04/06/16.  Acute blood loss anemia. Patient apparently was transfused 7 units of packed red blood cells during her hospitalization in July 2017. On admission, her hemoglobin was 9.4. It fell to 8.1 possibly from the blood loss and from hemodilution effects of IV fluids and equilibration. She was asymptomatic. -At the time of discharge, she was prescribed ferrous sulfate.  Essential hypertension. Patient is treated chronically with lisinopril 20 mg daily and Toprol-XL 12.5 mg daily. Both were continued. Her blood pressure remained stable and controlled.  Stage II to stage III chronic kidney disease. Patient's creatinine was 1.41 on admission. Her baseline creatinine is 1.2-1.3. With volume repletion from IV fluids, her creatinine has improved 1.12..  Osteoarthritis of knees. Patient had been taking oral diclofenac for arthritic knee pain. She was instructed to stop taking this. Topical Voltaren was ordered and prescribed at the time of discharge. She was also given a prescription for short course of hydrocodone 5 mg twice a day when necessary (without refills).. Further management will be deferred to her PCP.    Procedures:  None  Consultations:  Gastroenterology  Discharge Exam: Vitals:   04/04/16 1309 04/05/16 0533  BP: (!) 124/53 (!) 121/59  Pulse:  65 75  Resp: 20 20  Temp: 97.4 F (36.3 C) 97.7 F (36.5 C)    General: Pleasant 75 year old woman in no acute distress. Cardiovascular: S1, S2, with a soft systolic murmur. No pedal edema. Respiratory: Clear to auscultation bilaterally. Abdomen: Positive bowel sounds, soft, obese, nontender, nondistended. Extremities: Mild tenderness of palpation of both knees without effusion warmth or erythema.  Discharge Instructions   Discharge Instructions    Diet - low sodium heart healthy    Complete by:  As directed    Discharge instructions    Complete by:  As directed    Increase activity slowly    Complete by:  As directed      Current Discharge Medication List    START taking these medications   Details  diclofenac sodium (VOLTAREN) 1 % GEL Apply 4 g topically 3 (three) times daily as needed. For knee pain. Qty: 1 Tube, Refills: 2    ferrous sulfate 325 (65 FE) MG tablet Take 1 tablet (325 mg total) by mouth 2 (two) times daily with a meal. IRON SUPPLEMENT    HYDROcodone-acetaminophen (NORCO/VICODIN) 5-325 MG tablet Take 1 tablet by mouth every 12 (twelve) hours as needed for moderate pain. Qty: 15 tablet, Refills: 0      CONTINUE these medications which have CHANGED   Details  aspirin EC 81 MG tablet Take 1 tablet (81 mg total) by mouth daily. Resume on 04/06/2016.      CONTINUE these medications which have NOT CHANGED   Details  atorvastatin (LIPITOR) 40 MG tablet TAKE ONE TABLET BY MOUTH DAILY AT 6 PM. Qty: 90 tablet, Refills: 1    b complex vitamins capsule Take 1 capsule by mouth daily.    lisinopril (PRINIVIL,ZESTRIL) 20 MG tablet Take 1 tablet (20 mg total) by mouth daily. Qty: 30 tablet, Refills: 3    metoprolol succinate (TOPROL-XL) 25 MG 24 hr tablet Take 0.5 tablets (12.5 mg total) by mouth daily. Qty: 30 tablet, Refills: 3    nitroGLYCERIN (NITROSTAT) 0.4 MG SL tablet Place 1 tablet (0.4 mg total) under the tongue every 5 (five) minutes as needed for  chest pain. Qty: 25 tablet, Refills: 3   Associated Diagnoses: NSTEMI (non-ST elevated myocardial infarction) (HCC)    Omega-3 Fatty Acids (FISH OIL) 1000 MG CAPS Take 3,000 mg by mouth daily.     pantoprazole (PROTONIX) 40 MG tablet Take 1 tablet (40 mg total) by mouth daily. Qty: 30 tablet, Refills: 3      STOP taking these medications     diclofenac (VOLTAREN) 75 MG EC tablet        Allergies  Allergen Reactions  . Bayer Advanced Aspirin [Aspirin] Palpitations    Patient can tolerate "low dose" aspirin  . Codeine Other (See Comments)    Makes her feel "high"   Follow-up Information    [facility], MD. Schedule an appointment as soon as possible for a visit.   Specialty:  Internal Medicine Why:  TO F/UP IN 1-2 WEEKS. YOU WILL NEED YOUR BLOOD COUNTS RECHECKED. Contact information: [place] [address] [phone]        Barney Drain, MD Follow up today.   Specialty:  Gastroenterology Why:  GI DOCTOR. FOLLOW UP AS NEEDED. Contact information: [address] [place] [address] [phone]            The results of significant  diagnostics from this hospitalization (including imaging, microbiology, ancillary and laboratory) are listed below for reference.    Significant Diagnostic Studies: Dg Abd Acute W/chest  Result Date: 04/04/2016 CLINICAL DATA:  GI bleeding. EXAM: DG ABDOMEN ACUTE W/ 1V CHEST COMPARISON:  09/01/2015 FINDINGS: Left base atelectasis. Right lung is clear. Heart is normal size. No effusions. Nonobstructive bowel gas pattern. Gas throughout nondistended large bowel. No free air organomegaly. Calcified phleboliths and vascular calcifications in the pelvis. IMPRESSION: No evidence of bowel obstruction or free air. Left base atelectasis. Electronically Signed   By: Rolm Baptise M.D.   On: 04/04/2016 18:23    Microbiology: No results found for this or any previous visit (from the past 240 hour(s)).   Labs: Basic Metabolic  Panel:  Recent Labs Lab 04/03/16 2126 04/04/16 0525 04/05/16 0754  NA 138 140 141  K 4.2 4.0 3.9  CL 110 113* 115*  CO2 23 22 23   GLUCOSE 155* 104* 96  BUN 33* 31* 25*  CREATININE 1.41* 1.14* 1.12*  CALCIUM 8.9 8.7* 8.7*   Liver Function Tests:  Recent Labs Lab 04/03/16 2126  AST 20  ALT 13*  ALKPHOS 79  BILITOT 0.4  PROT 6.8  ALBUMIN 3.3*   No results for input(s): LIPASE, AMYLASE in the last 168 hours. No results for input(s): AMMONIA in the last 168 hours. CBC:  Recent Labs Lab 04/03/16 2126 04/04/16 0525 04/04/16 0948 04/05/16 0754  WBC 8.1 6.8  --  5.6  NEUTROABS 5.0  --   --   --   HGB 9.4* 8.2* 9.0* 8.1*  HCT 29.1* 26.2* 28.3* 25.0*  MCV 93.3 94.9  --  94.3  PLT 252 237  --  233   Cardiac Enzymes: No results for input(s): CKTOTAL, CKMB, CKMBINDEX, TROPONINI in the last 168 hours. BNP: BNP (last 3 results) No results for input(s): BNP in the last 8760 hours.  ProBNP (last 3 results) No results for input(s): PROBNP in the last 8760 hours.  CBG: No results for input(s): GLUCAP in the last 168 hours.     Signed:  Antonique Langford MD.  [facility] 04/05/2016, 11:52 AM

## 2016-04-05 NOTE — Progress Notes (Signed)
Lost IV access, no IVF infusing at this time. Patient eating and drinking well. MD notified.

## 2016-04-05 NOTE — Progress Notes (Signed)
   Assessment/Plan: ADMITTED WITH 2ND LGIB WHILE TAKING ASA/NSAIDs DUE TO DIVERTICULOSIS. RECENT EGD/TCS. HB STABLE SMALL VOLUME BLEEDING. CLINICALLY IMPROVED.  PLAN: 1. DISCUSS ED WIT PHARMACY([facility])-33% SYSTEMIC BIOAVAILABILITY. OK TO DC ON VOLTAREN GEL. 2. RE-START ASA NOV 19-CARDIAC BENEFITS OUTWEIGH RISK SOF BLEEDING. 3. AVOID ALL PO NSAIDS. PT VOICED HER UNDERSTANDING. 4. PROTONIX 1-2X DAILY AS OUTPATIENT. 5. OPV IN 4 MOS W/ DR. Frankee Gritz.    Subjective: Since I last evaluated the patient SHE HAS HAD SMALL VOLUME BRBPR WHEN WIPING. FEELS LIKE GOING HOME. KNEE PAIN RELIEVED WITH VOLTAREN GEL. NO pRBCs SINCE ADMISSION.  Objective: Vital signs in last 24 hours: Vitals:   04/04/16 1309 04/05/16 0533  BP: (!) 124/53 (!) 121/59  Pulse: 65 75  Resp: 20 20  Temp: 97.4 F (36.3 C) 97.7 F (36.5 C)   General appearance: alert, cooperative and no distress Resp: clear to auscultation bilaterally Cardio: regular rate and rhythm GI: soft, non-tender; bowel sounds normal;   Lab Results:  HB NOV 18-19 8.1-9.0, GFR 54    Studies/Results: Dg Abd Acute W/chest  Result Date: 04/04/2016 CLINICAL DATA:  GI bleeding. EXAM: DG ABDOMEN ACUTE W/ 1V CHEST COMPARISON:  09/01/2015 FINDINGS: Left base atelectasis. Right lung is clear. Heart is normal size. No effusions. Nonobstructive bowel gas pattern. Gas throughout nondistended large bowel. No free air organomegaly. Calcified phleboliths and vascular calcifications in the pelvis. IMPRESSION: No evidence of bowel obstruction or free air. Left base atelectasis. Electronically Signed   By: Rolm Baptise M.D.   On: 04/04/2016 18:23    Medications: I have reviewed the patient's current medications.   LOS: 5 days   Barney Drain 10/27/2013, 2:23 PM

## 2016-04-05 NOTE — Progress Notes (Signed)
Patient consumed 90% of her breakfast (solid food). Denies any c/o stomach pain or discomfort at this time.

## 2016-04-05 NOTE — Progress Notes (Signed)
1230 d/c instructions, paperwork, tube of diclofenac sodium gel and hard Rxs given to patient. No additional IV catheters to remove. Telemetry box and wiring removed from patient and central telemetry called and made aware. No swelling, no s/s of infection noted to RIGHT hand/wrist where prior IV catheter was removed by patient. Patient waiting in room with her grand daughter at this time waiting on the arrival of more family to transport her home. All questions answered, patient denied having any further questions at this time.

## 2016-06-26 ENCOUNTER — Encounter: Payer: Self-pay | Admitting: Gastroenterology

## 2016-07-08 ENCOUNTER — Telehealth: Payer: Self-pay | Admitting: *Deleted

## 2016-07-08 NOTE — Telephone Encounter (Signed)
Pt daughter called to make pt appt says having chest pain off and on - did not know about any other symptoms pt was having says mother was at work - offered appt with extender in [place] this week - daughter gave me pt cell number - LM on pt VM X2

## 2016-07-10 ENCOUNTER — Telehealth: Payer: Self-pay | Admitting: Gastroenterology

## 2016-07-10 NOTE — Telephone Encounter (Signed)
Pt has seen blood in stool x 1 , yesterday and is having some abdominal pain at the top of her stomach. I have scheduled her and OV with Roseanne Kaufman, NP on 07/11/2016 at 10:00 AM. I cancelled the appt on 07/29/2016.

## 2016-07-10 NOTE — Telephone Encounter (Signed)
Pt called to make OV because she has blood in her stools and she was on March recall list to follow up with [place]. She is aware of OV with [facility] on 3/13 at 0930 but would like to speak with nurse because she would like to know what SF would recommend for her to do in the meantime. [phone]

## 2016-07-10 NOTE — Telephone Encounter (Signed)
Shelly [name] is aware she is on schedule for tomorrow.

## 2016-07-11 ENCOUNTER — Encounter: Payer: Self-pay | Admitting: Gastroenterology

## 2016-07-11 ENCOUNTER — Ambulatory Visit (INDEPENDENT_AMBULATORY_CARE_PROVIDER_SITE_OTHER): Payer: Medicare Other | Admitting: Gastroenterology

## 2016-07-11 VITALS — BP 146/72 | HR 83 | Temp 97.6°F | Ht 62.0 in | Wt 227.4 lb

## 2016-07-11 DIAGNOSIS — K219 Gastro-esophageal reflux disease without esophagitis: Secondary | ICD-10-CM | POA: Diagnosis not present

## 2016-07-11 DIAGNOSIS — K625 Hemorrhage of anus and rectum: Secondary | ICD-10-CM | POA: Diagnosis not present

## 2016-07-11 LAB — CBC
HCT: 29 % — ABNORMAL LOW (ref 35.0–45.0)
Hemoglobin: 8.9 g/dL — ABNORMAL LOW (ref 11.7–15.5)
MCH: 27.1 pg (ref 27.0–33.0)
MCHC: 30.7 g/dL — AB (ref 32.0–36.0)
MCV: 88.4 fL (ref 80.0–100.0)
MPV: 9.8 fL (ref 7.5–12.5)
PLATELETS: 352 10*3/uL (ref 140–400)
RBC: 3.28 MIL/uL — AB (ref 3.80–5.10)
RDW: 15.7 % — ABNORMAL HIGH (ref 11.0–15.0)
WBC: 5.4 10*3/uL (ref 3.8–10.8)

## 2016-07-11 MED ORDER — DEXLANSOPRAZOLE 60 MG PO CPDR: 60.0000 mg | DELAYED_RELEASE_CAPSULE | Freq: Every day | ORAL | 3 refills | Status: DC

## 2016-07-11 MED ORDER — HYDROCORTISONE 2.5 % RE CREA: 1.0000 "application " | TOPICAL_CREAM | Freq: Two times a day (BID) | RECTAL | 1 refills | Status: DC

## 2016-07-11 NOTE — Progress Notes (Signed)
Referring Provider: [name], MD Primary Care Physician:  [name], MD  Primary GI: Dr. Oneida Alar   Chief Complaint  Patient presents with  . Blood In Stools  . Abdominal Pain  . Nausea    HPI:   Shelly [name] is a 76 y.o. female presenting today with a history of GI bleeding, recently inpatient in Nov 2017. EGD and colonoscopy July 2017 by Dr. Michail Sermon with normal EGD and colonoscopy showing pancolonic diverticulosis. Presumed diverticular bleed at that time. Nov 2017 readmitted with suspected diverticular bleed in setting of oral diclofenac.   States 2 days ago she noted blood in her BM. Eats cereal every morning that has fiber. Usually between 1-3 pm has a BM and noted it when she wiped. Blood was mixed in with her bowels. No blood since then. No BM today. Sometimes has to strain. Rectum feels uncomfortable. Sometimes feels just a little nauseated and may give her a sick headache but "not bad". Only taking tylenol. Known internal hemorrhoids on last colonoscopy, medium sized. Nausea started yesterday. Peppermint takes the nausea away. Nausea is associated with a very mild headache. Sometimes a little epigastric discomfort at night and feels "restless". Protonix once daily. Not working as well. Has tried/failed Prilosec, Nexium, as well.   Past Medical History:  Diagnosis Date  . Anginal pain ([place])   . Arthritis   . Coronary artery disease    SEVERE  . GERD (gastroesophageal reflux disease)   . GI bleed 12/2013  . Goiter   . Hypertension   . Myocardial infarction 08/2013  . Pancolonic diverticulosis 12/2013    Past Surgical History:  Procedure Laterality Date  . COLONOSCOPY N/A 01/13/2014   Dr. Hung:pandiverticulosis   . COLONOSCOPY N/A 12/14/2015   Dr. Michail Sermon: pancolonic diverticulosis, internal hemorrhoids   . CORNEAL TRANSPLANT    . CORONARY ANGIOPLASTY  08/2013  . ESOPHAGOGASTRODUODENOSCOPY N/A 12/14/2015   Dr. Michail Sermon: normal   . HEEL SPUR EXCISION    . LEFT  HEART CATHETERIZATION WITH CORONARY ANGIOGRAM N/A 08/23/2013   Procedure: LEFT HEART CATHETERIZATION WITH CORONARY ANGIOGRAM;  Surgeon: Peter M Martinique, MD;  Location: [facility] CATH LAB;  Service: Cardiovascular;  Laterality: N/A;  . PERCUTANEOUS CORONARY STENT INTERVENTION (PCI-S) N/A 08/24/2013   Procedure: PERCUTANEOUS CORONARY STENT INTERVENTION (PCI-S);  Surgeon: Peter M Martinique, MD;  Location: [facility] CATH LAB;  Service: Cardiovascular;  Laterality: N/A;  . TONSILLECTOMY    . TUBAL LIGATION      Current Outpatient Prescriptions  Medication Sig Dispense Refill  . aspirin EC 81 MG tablet Take 1 tablet (81 mg total) by mouth daily. Resume on 04/06/2016.    Marland Kitchen atorvastatin (LIPITOR) 40 MG tablet TAKE ONE TABLET BY MOUTH DAILY AT 6 PM. 90 tablet 1  . b complex vitamins capsule Take 1 capsule by mouth daily.    . cetirizine (ZYRTEC) 10 MG tablet Take 10 mg by mouth daily.    . diclofenac sodium (VOLTAREN) 1 % GEL Apply 4 g topically 3 (three) times daily as needed. For knee pain. 1 Tube 2  . HYDROcodone-acetaminophen (NORCO/VICODIN) 5-325 MG tablet Take 1 tablet by mouth every 12 (twelve) hours as needed for moderate pain. 15 tablet 0  . lisinopril (PRINIVIL,ZESTRIL) 20 MG tablet Take 1 tablet (20 mg total) by mouth daily. 30 tablet 3  . metoprolol succinate (TOPROL-XL) 25 MG 24 hr tablet Take 0.5 tablets (12.5 mg total) by mouth daily. 30 tablet 3  . nitroGLYCERIN (NITROSTAT) 0.4 MG SL tablet Place 1  tablet (0.4 mg total) under the tongue every 5 (five) minutes as needed for chest pain. 25 tablet 3  . pantoprazole (PROTONIX) 40 MG tablet Take 1 tablet (40 mg total) by mouth daily. 30 tablet 3  . dexlansoprazole (DEXILANT) 60 MG capsule Take 1 capsule (60 mg total) by mouth daily. 90 capsule 3  . hydrocortisone (ANUSOL-HC) 2.5 % rectal cream Place 1 application rectally 2 (two) times daily. 30 g 1   No current facility-administered medications for this visit.     Allergies as of 07/11/2016 - Review Complete  07/11/2016  Allergen Reaction Noted  . Bayer advanced aspirin [aspirin] Palpitations 08/22/2013  . Codeine Other (See Comments)     Family History  Problem Relation Age of Onset  . Hypertension Brother     Social History   Social History  . Marital status: Single    Spouse name: N/A  . Number of children: N/A  . Years of education: N/A   Social History Main Topics  . Smoking status: Never Smoker  . Smokeless tobacco: Never Used  . Alcohol use No  . Drug use: No  . Sexual activity: No   Other Topics Concern  . None   Social History Narrative  . None    Review of Systems: As mentioned in HPI   Physical Exam: BP (!) 146/72   Pulse 83   Temp 97.6 F (36.4 C) (Oral)   Ht 5\' 2"  (1.575 m)   Wt 227 lb 6.4 oz (103.1 kg)   BMI 41.59 kg/m  General:   Alert and oriented. No distress noted. Pleasant and cooperative.  Head:  Normocephalic and atraumatic. Eyes:  Conjuctiva clear without scleral icterus. Abdomen:  +BS, soft, non-tender and non-distended. No rebound or guarding. No HSM or masses noted. Rectal: non-thrombosed external hemorrhoids, possible internal hemorrhoids Extremities:  Without edema. Neurologic:  Alert and  oriented x4;  grossly normal neurologically. Psych:  Alert and cooperative. Normal mood and affect.

## 2016-07-11 NOTE — Assessment & Plan Note (Signed)
Low-volume hematochezia recently noted, endorses straining. Known internal hemorrhoids. Previous diverticular bleed but does not appear consistent with hemodynamically significant lower GI bleed currently. Add fiber daily, add Anusol, check CBC now.

## 2016-07-11 NOTE — Assessment & Plan Note (Signed)
Dexilant sent to pharmacy as Protonix not working as well lately. No alarm features. EGD fairly recent.

## 2016-07-11 NOTE — Patient Instructions (Addendum)
I have sent in a cream to use twice a day per rectum for 7 days.   Avoid straining. Follow a high fiber diet. I have also given samples of fiber to take daily.  Stop Protonix. Trial of Dexilant once each morning to see how your reflux does. Call me with how this works.   Please have blood work done today.     Hemorrhoids Introduction Hemorrhoids are swollen veins in and around the rectum or anus. Hemorrhoids can cause pain, itching, or bleeding. Most of the time, they do not cause serious problems. They usually get better with diet changes, lifestyle changes, and other home treatments. Follow these instructions at home: Eating and drinking  Eat foods that have fiber, such as whole grains, beans, nuts, fruits, and vegetables. Ask your doctor about taking products that have added fiber (fibersupplements).  Drink enough fluid to keep your pee (urine) clear or pale yellow. For Pain and Swelling  Take a warm-water bath (sitz bath) for 20 minutes to ease pain. Do this 3-4 times a day.  If directed, put ice on the painful area. It may be helpful to use ice between your warm baths.  Put ice in a plastic bag.  Place a towel between your skin and the bag.  Leave the ice on for 20 minutes, 2-3 times a day. General instructions  Take over-the-counter and prescription medicines only as told by your doctor.  Medicated creams and medicines that are inserted into the anus (suppositories) may be used or applied as told.  Exercise often.  Go to the bathroom when you have the urge to poop (to have a bowel movement). Do not wait.  Avoid pushing too hard (straining) when you poop.  Keep the butt area dry and clean. Use wet toilet paper or moist paper towels.  Do not sit on the toilet for a long time. Contact a doctor if:  You have any of these:  Pain and swelling that do not get better with treatment or medicine.  Bleeding that will not stop.  Trouble pooping or you cannot  poop.  Pain or swelling outside the area of the hemorrhoids. This information is not intended to replace advice given to you by your health care provider. Make sure you discuss any questions you have with your health care provider. Document Released: 02/12/2008 Document Revised: 10/11/2015 Document Reviewed: 01/17/2015  2017 Elsevier

## 2016-07-14 ENCOUNTER — Other Ambulatory Visit: Payer: Self-pay

## 2016-07-14 DIAGNOSIS — I10 Essential (primary) hypertension: Secondary | ICD-10-CM

## 2016-07-15 ENCOUNTER — Other Ambulatory Visit: Payer: Self-pay

## 2016-07-15 ENCOUNTER — Telehealth: Payer: Self-pay | Admitting: Gastroenterology

## 2016-07-15 DIAGNOSIS — D649 Anemia, unspecified: Secondary | ICD-10-CM

## 2016-07-15 NOTE — Telephone Encounter (Signed)
Looks like she has tried all except Zegerid and Aciphex. I can send in Aciphex and see how this does? Also, her Hgb is still low but stable from November. We need to investigate this further with iron, ferritin, TIBC. I included this in the result note.

## 2016-07-15 NOTE — Progress Notes (Signed)
cc'ed to pcp °

## 2016-07-15 NOTE — Progress Notes (Signed)
Hgb similar to prior check in Nov 2017. We need to add on ferritin, iron, TIBC.

## 2016-07-15 NOTE — Telephone Encounter (Signed)
Pt said her Dexilant was going to cost more than $200.00. I have her two boxes ( #10) tablets at front for pick up. Per Cloyde Reams at [employer] [employer], pt's copay would be over $200.00.  Vicente Males, please advise what you want pt to try.

## 2016-07-15 NOTE — Progress Notes (Signed)
LMOM to call. New lab orders have been entered.

## 2016-07-15 NOTE — Telephone Encounter (Signed)
Pt called asking to speak with AB. I told her that AB was with patients and I would have to take a message and the nurse could call her back. She has questions about her medications. Please call [phone]

## 2016-07-15 NOTE — Progress Notes (Signed)
Labs could not be added. Will send pt back to lab.

## 2016-07-15 NOTE — Telephone Encounter (Signed)
LMOM for a return call.  

## 2016-07-16 NOTE — Progress Notes (Signed)
Pt is aware to go to the lab.

## 2016-07-17 ENCOUNTER — Telehealth: Payer: Self-pay

## 2016-07-17 NOTE — Telephone Encounter (Signed)
Pt called office. Her Dexilant rx is going to cost over $200. Dexilant co-pay mailed to pt at her request.

## 2016-07-21 ENCOUNTER — Telehealth: Payer: Self-pay

## 2016-07-21 MED ORDER — RABEPRAZOLE SODIUM 20 MG PO TBEC: 20.0000 mg | DELAYED_RELEASE_TABLET | Freq: Every day | ORAL | 3 refills | Status: DC

## 2016-07-21 NOTE — Telephone Encounter (Signed)
I sent it in 

## 2016-07-21 NOTE — Telephone Encounter (Signed)
Pt has never tried the Zegerid or the Aciphex. She said OK to send in the prescription for the Aciphex.  Please let me know when sent in so I can let her know.  She is aware of labs and will go to get those drawn.

## 2016-07-21 NOTE — Telephone Encounter (Signed)
Called Dr. Francesca Oman office to follow-up on referral that was done via Epic. Receptionist advised she would call the pt.

## 2016-07-21 NOTE — Addendum Note (Signed)
Addended by: Annitta Needs on: 07/21/2016 05:08 PM   Modules accepted: Orders

## 2016-07-22 NOTE — Telephone Encounter (Signed)
Pt is aware.  

## 2016-07-23 LAB — IRON AND TIBC
%SAT: 7 % — ABNORMAL LOW (ref 11–50)
Iron: 20 ug/dL — ABNORMAL LOW (ref 45–160)
TIBC: 295 ug/dL (ref 250–450)
UIBC: 275 ug/dL (ref 125–400)

## 2016-07-23 LAB — FERRITIN: Ferritin: 11 ng/mL — ABNORMAL LOW (ref 20–288)

## 2016-07-25 ENCOUNTER — Encounter: Payer: Self-pay | Admitting: Family Medicine

## 2016-07-25 ENCOUNTER — Ambulatory Visit (INDEPENDENT_AMBULATORY_CARE_PROVIDER_SITE_OTHER): Payer: Medicare Other | Admitting: Family Medicine

## 2016-07-25 VITALS — BP 140/74 | HR 84 | Resp 15 | Ht 62.0 in | Wt 229.0 lb

## 2016-07-25 DIAGNOSIS — K2100 Gastro-esophageal reflux disease with esophagitis, without bleeding: Secondary | ICD-10-CM

## 2016-07-25 DIAGNOSIS — N183 Chronic kidney disease, stage 3 unspecified: Secondary | ICD-10-CM

## 2016-07-25 DIAGNOSIS — K5730 Diverticulosis of large intestine without perforation or abscess without bleeding: Secondary | ICD-10-CM

## 2016-07-25 DIAGNOSIS — Z7689 Persons encountering health services in other specified circumstances: Secondary | ICD-10-CM

## 2016-07-25 DIAGNOSIS — M1712 Unilateral primary osteoarthritis, left knee: Secondary | ICD-10-CM | POA: Diagnosis not present

## 2016-07-25 DIAGNOSIS — I2510 Atherosclerotic heart disease of native coronary artery without angina pectoris: Secondary | ICD-10-CM

## 2016-07-25 MED ORDER — FERROUS GLUCONATE 324 (38 FE) MG PO TABS: 324.0000 mg | ORAL_TABLET | Freq: Every day | ORAL | 0 refills | Status: DC

## 2016-07-25 MED ORDER — HYDROCODONE-ACETAMINOPHEN 5-325 MG PO TABS: 1.0000 | ORAL_TABLET | Freq: Two times a day (BID) | ORAL | 0 refills | Status: DC | PRN

## 2016-07-25 NOTE — Patient Instructions (Signed)
Please take an iron supplement Take every other day to see if you can tolerate Eat iron rich foods  Need old records  Work on reducing calories and portions to take some weight off the knee  See me in a month Need a physical Call for problems or questions

## 2016-07-25 NOTE — Progress Notes (Signed)
Chief Complaint  Patient presents with  . Cough    nagging cough, producing some phlegm but not very much. Started a week ago   . Establish Care   Under care cardiology for CAD, hypertension and hyperlipidemia that are well controlled. No chest pain.  Compliant with medicines.  Under care GI for pandiverticulosis with recurrent GI bleed, chronic GERD.  She was seen recently and is still anemic from her last bleed/hosptial stay.  She is not taking iron because it causes GI distress.  Is given ferrous gluconate to try QOD.  Discussed iron foods.  Has severe osteoarthritis of her left knee with daily knee pain.  Takes tylenol daily.  When severe, she takes norco 5.  Her last Rx for norco lasted her 4 months (15 pills).  She cannot take ANY NSAID meds and  is even cautious about voltaren gel.  Is not under the care of an orthopedic.  No recent x rays.  Her PCP does repeated cortisone shots that do not help much.  Discussed conservative management of arthritic knee pain and recommend weight loss and PT for knee strengthening exercise.  Is obese with BMI of 41%.  Has current sinus congestion and drainage with slight cough - virus vs allergies.  Refuses flu shots Refuses mammograms    Patient Active Problem List   Diagnosis Date Noted  . CKD (chronic kidney disease), stage III 04/04/2016  . Osteoarthritis left knee 04/04/2016  . Third degree heart block ([place]) 12/10/2015  . Lower GI bleeding 12/09/2015  . Syncope and collapse 12/09/2015  . Pancolonic diverticulosis 12/09/2015  . CAD (coronary artery disease) 01/11/2014  . NSTEMI (non-ST elevated myocardial infarction) ([place]) 08/23/2013  . Chest pain 08/22/2013  . Essential hypertension 08/22/2013  . GERD (gastroesophageal reflux disease) 08/22/2013  . Anxiety 08/22/2013    Outpatient Encounter Prescriptions as of 07/25/2016  Medication Sig  . aspirin EC 81 MG tablet Take 1 tablet (81 mg total) by mouth daily. Resume on 04/06/2016.  Marland Kitchen  atorvastatin (LIPITOR) 40 MG tablet TAKE ONE TABLET BY MOUTH DAILY AT 6 PM.  . b complex vitamins capsule Take 1 capsule by mouth daily.  . cetirizine (ZYRTEC) 10 MG tablet Take 10 mg by mouth daily.  Marland Kitchen dexlansoprazole (DEXILANT) 60 MG capsule Take 1 capsule (60 mg total) by mouth daily.  Marland Kitchen HYDROcodone-acetaminophen (NORCO/VICODIN) 5-325 MG tablet Take 1 tablet by mouth every 12 (twelve) hours as needed for moderate pain.  . hydrocortisone (ANUSOL-HC) 2.5 % rectal cream Place 1 application rectally 2 (two) times daily.  Marland Kitchen lisinopril (PRINIVIL,ZESTRIL) 20 MG tablet Take 1 tablet (20 mg total) by mouth daily.  . metoprolol succinate (TOPROL-XL) 25 MG 24 hr tablet Take 0.5 tablets (12.5 mg total) by mouth daily.  . psyllium (METAMUCIL) 58.6 % powder Take 1 packet by mouth daily.  . ferrous gluconate (FERGON) 324 MG tablet Take 1 tablet (324 mg total) by mouth daily with breakfast.  . RABEprazole (ACIPHEX) 20 MG tablet Take 1 tablet (20 mg total) by mouth daily. (Patient not taking: Reported on 07/25/2016)   No facility-administered encounter medications on file as of 07/25/2016.     Past Medical History:  Diagnosis Date  . Allergy   . Anemia    GI bleed  . Anginal pain ([place])   . Anxiety   . Arthritis   . Blood transfusion without reported diagnosis   . Cataract   . Chronic kidney disease   . Coronary artery disease    SEVERE  .  GERD (gastroesophageal reflux disease)   . GI bleed 12/2013  . Goiter   . Hyperlipidemia   . Hypertension   . Myocardial infarction 08/2013  . Pancolonic diverticulosis 12/2013    Past Surgical History:  Procedure Laterality Date  . COLONOSCOPY N/A 01/13/2014   Dr. Hung:pandiverticulosis   . COLONOSCOPY N/A 12/14/2015   Dr. Michail Sermon: pancolonic diverticulosis, internal hemorrhoids   . CORNEAL TRANSPLANT    . CORONARY ANGIOPLASTY  08/2013  . ESOPHAGOGASTRODUODENOSCOPY N/A 12/14/2015   Dr. Michail Sermon: normal   . HEEL SPUR EXCISION    . LEFT HEART CATHETERIZATION  WITH CORONARY ANGIOGRAM N/A 08/23/2013   Procedure: LEFT HEART CATHETERIZATION WITH CORONARY ANGIOGRAM;  Surgeon: Peter M Martinique, MD;  Location: [facility] CATH LAB;  Service: Cardiovascular;  Laterality: N/A;  . PERCUTANEOUS CORONARY STENT INTERVENTION (PCI-S) N/A 08/24/2013   Procedure: PERCUTANEOUS CORONARY STENT INTERVENTION (PCI-S);  Surgeon: Peter M Martinique, MD;  Location: [facility] CATH LAB;  Service: Cardiovascular;  Laterality: N/A;  . TONSILLECTOMY    . TUBAL LIGATION      Social History   Social History  . Marital status: Widowed    Spouse name: N/A  . Number of children: 8  . Years of education: 64   Occupational History  . 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 Smoking status: Never Smoker  . Smokeless tobacco: Never Used  . Alcohol use No  . Drug use: No  . Sexual activity: No   Other Topics Concern  . 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 Hypertension Brother   . Cancer Mother     unsure of type   . Asthma Sister   . Stroke Sister   . Early death Brother   . Early death Brother   . Cancer Daughter     liver    Review of Systems  Constitutional: Negative for chills, fever and weight loss.  HENT: Positive for congestion. Negative for hearing loss.   Eyes: Negative for blurred vision and pain.  Respiratory: Positive for cough. Negative for shortness of breath.   Cardiovascular: Negative for chest pain and leg swelling.  Gastrointestinal: Positive for heartburn. Negative for abdominal pain, constipation and diarrhea.       Managed  Genitourinary: Negative for dysuria and frequency.  Musculoskeletal: Positive for joint pain. Negative for falls and myalgias.  Neurological: Negative for dizziness, seizures and headaches.  Psychiatric/Behavioral: Negative for depression. The patient  is not nervous/anxious and does not have insomnia.    BP 140/74   Pulse 84   Resp 15   Ht 5\' 2"  (1.575 m)   Wt 229 lb (103.9 kg)   SpO2 98%   BMI 41.88 kg/m   Physical Exam  Constitutional: She is oriented to person, place, and time. She appears well-developed.  Pleasant. Obese.  Antalgic gait.  HENT:  Head: Normocephalic and atraumatic.  Mouth/Throat: Oropharynx is clear and moist.  Eyes: Conjunctivae are normal. Pupils are equal, round, and reactive to light.  Neck: Normal range of motion. Neck supple.  Cardiovascular: Normal rate and regular rhythm.   Murmur heard. Pulmonary/Chest: Effort normal and breath sounds normal. No respiratory distress. She has no wheezes.  Musculoskeletal: Normal range of  motion. She exhibits no edema.  Left knee with crepitus and warmth  Lymphadenopathy:    She has no cervical adenopathy.  Neurological: She is alert and oriented to person, place, and time.  Psychiatric: She has a normal mood and affect. Her behavior is normal. Thought content normal.   ASSESSMENT/PLAN:  1. Coronary artery disease involving native coronary artery of native heart without angina pectoris  - CBC - Comprehensive metabolic panel - Lipid panel - VITAMIN D 25 Hydroxy (Vit-D Deficiency, Fractures) - Urinalysis, Routine w reflex microscopic  2. Gastroesophageal reflux disease with esophagitis  3. Pancolonic diverticulosis  4. CKD (chronic kidney disease), stage III  5. Primary osteoarthritis L knee  6. Establish new doctor   Patient Instructions  Please take an iron supplement Take every other day to see if you can tolerate Eat iron rich foods  Need old records  Work on reducing calories and portions to take some weight off the knee  See me in a month Need a physical Call for problems or questions   Raylene Everts, MD

## 2016-07-28 NOTE — Progress Notes (Signed)
Iron studies consistent with IDA. I don't believe she has ever had a capsule study. I recommend this at this point. Needs to start taking iron 325 mg po BID, monitor for constipation, may cause dark stool. If she is willing to pursue capsule, let me know.

## 2016-07-28 NOTE — Telephone Encounter (Signed)
According to Epic, pt has appt with Dr. Lysle Morales 08/25/16 at 10:00am.

## 2016-07-29 ENCOUNTER — Ambulatory Visit: Payer: Medicare Other | Admitting: Gastroenterology

## 2016-07-30 NOTE — Progress Notes (Signed)
Ok, let's go ahead and schedule.

## 2016-07-30 NOTE — Progress Notes (Signed)
Noted! Has Givens been scheduled?

## 2016-07-31 ENCOUNTER — Other Ambulatory Visit: Payer: Self-pay

## 2016-07-31 DIAGNOSIS — D509 Iron deficiency anemia, unspecified: Secondary | ICD-10-CM

## 2016-08-05 ENCOUNTER — Telehealth: Payer: Self-pay

## 2016-08-05 NOTE — Telephone Encounter (Signed)
Pt's daughter came by office to pick-up Givens instructions. Pt had requested Metamucil samples. Samples and coupons given.

## 2016-08-06 ENCOUNTER — Ambulatory Visit (INDEPENDENT_AMBULATORY_CARE_PROVIDER_SITE_OTHER): Payer: Medicare Other | Admitting: Cardiovascular Disease

## 2016-08-06 ENCOUNTER — Encounter: Payer: Self-pay | Admitting: Cardiovascular Disease

## 2016-08-06 VITALS — BP 144/80 | HR 74 | Ht 65.0 in | Wt 230.0 lb

## 2016-08-06 DIAGNOSIS — I10 Essential (primary) hypertension: Secondary | ICD-10-CM

## 2016-08-06 DIAGNOSIS — E7800 Pure hypercholesterolemia, unspecified: Secondary | ICD-10-CM | POA: Diagnosis not present

## 2016-08-06 DIAGNOSIS — I25118 Atherosclerotic heart disease of native coronary artery with other forms of angina pectoris: Secondary | ICD-10-CM | POA: Diagnosis not present

## 2016-08-06 DIAGNOSIS — I252 Old myocardial infarction: Secondary | ICD-10-CM

## 2016-08-06 MED ORDER — LISINOPRIL 30 MG PO TABS: 30.0000 mg | ORAL_TABLET | Freq: Every day | ORAL | 6 refills | Status: DC

## 2016-08-06 NOTE — Patient Instructions (Signed)
Medication Instructions:   Increase Lisinopril to 30mg  daily.   Continue all other medications.    Labwork: none  Testing/Procedures: none  Follow-Up: Your physician wants you to follow up in: 6 months.  You will receive a reminder letter in the mail one-two months in advance.  If you don't receive a letter, please call our office to schedule the follow up appointment   Any Other Special Instructions Will Be Listed Below (If Applicable).  If you need a refill on your cardiac medications before your next appointment, please call your pharmacy.

## 2016-08-06 NOTE — Progress Notes (Signed)
SUBJECTIVE: The patient returns for follow up of CAD. She was hospitalized in July 2017 for lower GI bleeding and recurrent syncope. Toprol-XL dose was reduced. Event monitor showed no arrhythmias.  She is feeling well and denies chest pain and shortness of breath. He has not had to use nitroglycerin since her last visit with me. She denies dizziness and palpitations. She complains of bilateral knee pain and some left scapular pain.  She reports some GI bleeding and is soon to undergo a capsule study.   Review of Systems: As per "subjective", otherwise negative.  Allergies  Allergen Reactions  . Nsaids Other (See Comments)    AVOID all NSAID due to history of GI bleeding  . Codeine Other (See Comments)    Makes her feel "high"    Current Outpatient Prescriptions  Medication Sig Dispense Refill  . aspirin EC 81 MG tablet Take 1 tablet (81 mg total) by mouth daily. Resume on 04/06/2016.    Marland Kitchen atorvastatin (LIPITOR) 40 MG tablet TAKE ONE TABLET BY MOUTH DAILY AT 6 PM. 90 tablet 1  . b complex vitamins capsule Take 1 capsule by mouth daily.    . cetirizine (ZYRTEC) 10 MG tablet Take 10 mg by mouth daily.    Marland Kitchen dexlansoprazole (DEXILANT) 60 MG capsule Take 1 capsule (60 mg total) by mouth daily. 90 capsule 3  . ferrous gluconate (FERGON) 324 MG tablet Take 1 tablet (324 mg total) by mouth daily with breakfast. 90 tablet 0  . HYDROcodone-acetaminophen (NORCO/VICODIN) 5-325 MG tablet Take 1 tablet by mouth every 12 (twelve) hours as needed for moderate pain. 20 tablet 0  . hydrocortisone (ANUSOL-HC) 2.5 % rectal cream Place 1 application rectally 2 (two) times daily. 30 g 1  . lisinopril (PRINIVIL,ZESTRIL) 20 MG tablet Take 1 tablet (20 mg total) by mouth daily. 30 tablet 3  . metoprolol succinate (TOPROL-XL) 25 MG 24 hr tablet Take 0.5 tablets (12.5 mg total) by mouth daily. 30 tablet 3  . nitroGLYCERIN (NITROSTAT) 0.4 MG SL tablet Place 1 tablet (0.4 mg total) under the tongue every  5 (five) minutes as needed for chest pain. 25 tablet 3  . psyllium (METAMUCIL) 58.6 % powder Take 1 packet by mouth daily.    . RABEprazole (ACIPHEX) 20 MG tablet Take 20 mg by mouth daily.  3   No current facility-administered medications for this visit.     Past Medical History:  Diagnosis Date  . Allergy   . Anemia    GI bleed  . Anginal pain ([place])   . Anxiety   . Arthritis   . Blood transfusion without reported diagnosis   . Cataract   . Chronic kidney disease   . Coronary artery disease    SEVERE  . GERD (gastroesophageal reflux disease)   . GI bleed 12/2013  . Goiter   . Hyperlipidemia   . Hypertension   . Myocardial infarction 08/2013  . Pancolonic diverticulosis 12/2013    Past Surgical History:  Procedure Laterality Date  . COLONOSCOPY N/A 01/13/2014   Dr. Hung:pandiverticulosis   . COLONOSCOPY N/A 12/14/2015   Dr. Michail Sermon: pancolonic diverticulosis, internal hemorrhoids   . CORNEAL TRANSPLANT    . CORONARY ANGIOPLASTY  08/2013  . ESOPHAGOGASTRODUODENOSCOPY N/A 12/14/2015   Dr. Michail Sermon: normal   . HEEL SPUR EXCISION    . LEFT HEART CATHETERIZATION WITH CORONARY ANGIOGRAM N/A 08/23/2013   Procedure: LEFT HEART CATHETERIZATION WITH CORONARY ANGIOGRAM;  Surgeon: Peter M Martinique, MD;  Location: [facility]  CATH LAB;  Service: Cardiovascular;  Laterality: N/A;  . PERCUTANEOUS CORONARY STENT INTERVENTION (PCI-S) N/A 08/24/2013   Procedure: PERCUTANEOUS CORONARY STENT INTERVENTION (PCI-S);  Surgeon: Peter M Martinique, MD;  Location: [facility] CATH LAB;  Service: Cardiovascular;  Laterality: N/A;  . TONSILLECTOMY    . TUBAL LIGATION      Social History   Social History  . Marital status: Widowed    Spouse name: N/A  . Number of children: 8  . 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 Smoking status: Never Smoker  . Smokeless tobacco: Never Used  . Alcohol use No  . Drug use: No  . Sexual activity: No   Other  Topics Concern  . 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 Neck: No JVD, no thyromegaly. Lungs: Clear to auscultation bilaterally with normal respiratory effort. CV: Nondisplaced PMI. Regular rate and rhythm, normal S1/S2, no S3/S4, no murmur. No pretibial or periankle edema.  Abdomen: Soft, nontender, obese. Neurologic: Alert and oriented.  Psych: Normal affect. Skin: Normal. Musculoskeletal: No gross deformities.    ECG: Most recent ECG reviewed.      ASSESSMENT AND PLAN:  1. CAD s/p NSTEMI with overlapping stents in the proximal to mid RCA on 08/24/13: Stable ischemic heart disease. She has residual moderate CAD but denies recurrent anginal symptoms. Continue ASA, lisinopril, Lipitor, and Toprol-XL.   2. Essential hypertension: Mildly elevated. Goal < 130/80 as per 2017 ACC/AHA guidelines. I will increase lisinopril to 30 mg daily.  3. Hyperlipidemia: LDL 51 on 12/14/15. Continue atorvastatin 40 mg.   Dispo: f/u 6 months.  Shelly [name], M.D., F.A.C.C.

## 2016-08-13 ENCOUNTER — Encounter (HOSPITAL_COMMUNITY)
Admission: RE | Disposition: A | Discharge status: Home / Self Care | Payer: Self-pay | Source: Ambulatory Visit | Attending: Gastroenterology

## 2016-08-13 ENCOUNTER — Ambulatory Visit (HOSPITAL_COMMUNITY)
Admission: RE | Admit: 2016-08-13 | Discharge: 2016-08-13 | Disposition: A | Discharge status: Home / Self Care | Payer: Medicare Other | Source: Ambulatory Visit | Attending: Gastroenterology | Admitting: Gastroenterology

## 2016-08-13 ENCOUNTER — Ambulatory Visit: Payer: Medicare Other | Admitting: Gastroenterology

## 2016-08-13 DIAGNOSIS — Z79899 Other long term (current) drug therapy: Secondary | ICD-10-CM | POA: Insufficient documentation

## 2016-08-13 DIAGNOSIS — D509 Iron deficiency anemia, unspecified: Secondary | ICD-10-CM | POA: Insufficient documentation

## 2016-08-13 DIAGNOSIS — N189 Chronic kidney disease, unspecified: Secondary | ICD-10-CM | POA: Insufficient documentation

## 2016-08-13 DIAGNOSIS — K219 Gastro-esophageal reflux disease without esophagitis: Secondary | ICD-10-CM | POA: Insufficient documentation

## 2016-08-13 DIAGNOSIS — K922 Gastrointestinal hemorrhage, unspecified: Secondary | ICD-10-CM | POA: Diagnosis present

## 2016-08-13 DIAGNOSIS — E785 Hyperlipidemia, unspecified: Secondary | ICD-10-CM | POA: Insufficient documentation

## 2016-08-13 DIAGNOSIS — Z7982 Long term (current) use of aspirin: Secondary | ICD-10-CM | POA: Insufficient documentation

## 2016-08-13 DIAGNOSIS — D500 Iron deficiency anemia secondary to blood loss (chronic): Secondary | ICD-10-CM

## 2016-08-13 DIAGNOSIS — I129 Hypertensive chronic kidney disease with stage 1 through stage 4 chronic kidney disease, or unspecified chronic kidney disease: Secondary | ICD-10-CM | POA: Diagnosis not present

## 2016-08-13 DIAGNOSIS — I2510 Atherosclerotic heart disease of native coronary artery without angina pectoris: Secondary | ICD-10-CM | POA: Insufficient documentation

## 2016-08-13 DIAGNOSIS — I252 Old myocardial infarction: Secondary | ICD-10-CM | POA: Diagnosis not present

## 2016-08-13 HISTORY — PX: GIVENS CAPSULE STUDY: SHX5432

## 2016-08-13 SURGERY — IMAGING PROCEDURE, GI TRACT, INTRALUMINAL, VIA CAPSULE
Anesthesia: Moderate Sedation

## 2016-08-14 ENCOUNTER — Encounter (HOSPITAL_COMMUNITY): Payer: Self-pay | Admitting: Gastroenterology

## 2016-08-14 ENCOUNTER — Telehealth: Payer: Self-pay | Admitting: Gastroenterology

## 2016-08-14 NOTE — Telephone Encounter (Signed)
Pt called to say that she had to swallow the pill/camera yesterday and wanted to know how long does it take to pass. Please call her at [phone]

## 2016-08-18 ENCOUNTER — Other Ambulatory Visit: Payer: Self-pay | Admitting: *Deleted

## 2016-08-18 ENCOUNTER — Encounter: Payer: Self-pay | Admitting: Gastroenterology

## 2016-08-18 MED ORDER — METOPROLOL SUCCINATE ER 25 MG PO TB24: 12.5000 mg | ORAL_TABLET | Freq: Every day | ORAL | 3 refills | Status: DC

## 2016-08-18 NOTE — Telephone Encounter (Signed)
LMOM to call.

## 2016-08-19 NOTE — Telephone Encounter (Signed)
PT is aware.

## 2016-08-25 ENCOUNTER — Encounter: Payer: Medicare Other | Admitting: Family Medicine

## 2016-08-27 ENCOUNTER — Encounter: Payer: Self-pay | Admitting: Family Medicine

## 2016-08-27 ENCOUNTER — Ambulatory Visit (INDEPENDENT_AMBULATORY_CARE_PROVIDER_SITE_OTHER): Payer: Medicare Other | Admitting: Family Medicine

## 2016-08-27 VITALS — BP 144/80 | HR 82 | Temp 97.5°F | Resp 18 | Ht 65.0 in | Wt 228.1 lb

## 2016-08-27 DIAGNOSIS — D509 Iron deficiency anemia, unspecified: Secondary | ICD-10-CM | POA: Diagnosis not present

## 2016-08-27 DIAGNOSIS — Z0001 Encounter for general adult medical examination with abnormal findings: Secondary | ICD-10-CM | POA: Diagnosis not present

## 2016-08-27 DIAGNOSIS — I10 Essential (primary) hypertension: Secondary | ICD-10-CM

## 2016-08-27 DIAGNOSIS — M1712 Unilateral primary osteoarthritis, left knee: Secondary | ICD-10-CM

## 2016-08-27 DIAGNOSIS — R221 Localized swelling, mass and lump, neck: Secondary | ICD-10-CM

## 2016-08-27 MED ORDER — ATORVASTATIN CALCIUM 40 MG PO TABS: 40.0000 mg | ORAL_TABLET | Freq: Every day | ORAL | 3 refills | Status: DC

## 2016-08-27 MED ORDER — RABEPRAZOLE SODIUM 20 MG PO TBEC: 20.0000 mg | DELAYED_RELEASE_TABLET | Freq: Every day | ORAL | 3 refills | Status: DC

## 2016-08-27 NOTE — Patient Instructions (Addendum)
I have referred for injection of the left knee  I have referred for doppler ultrasound of neck  Take the iron daily Drink plenty of water  Return for lab tests and a follow up in 3 months

## 2016-08-27 NOTE — Progress Notes (Signed)
Chief Complaint  Patient presents with  . Annual Exam   Did Not get the laboratory work that was ordered last time. She is under the care of gastroenterology. She had a recent study. Does not yet know the results. She is not taking her iron. She has iron deficiency anemia. She is advised today that she needs to go back on this. She is compliant with her medications. She has no new complaints. Her biggest issue remains knee pain. She does not want any surgery. She does not want any cortisone injections in her knee. She did have a prior Visco supplement injection that was helpful. She will referred to orthopedics for consultation. The patient is morbidly obese. We did discuss her need to reduce her calories, portions, and fats. She needs to exercise every day she is able. She does have difficulty exercising because her knee. She also has difficulty finding time because at 70 she continues to work in order to support her grandchildren.  Patient Active Problem List   Diagnosis Date Noted  . Iron deficiency anemia 08/27/2016  . Morbid obesity ([place]) 08/27/2016  . CKD (chronic kidney disease), stage III 04/04/2016  . Osteoarthritis left knee 04/04/2016  . Third degree heart block ([place]) 12/10/2015  . Lower GI bleeding 12/09/2015  . Syncope and collapse 12/09/2015  . Pancolonic diverticulosis 12/09/2015  . CAD (coronary artery disease) 01/11/2014  . NSTEMI (non-ST elevated myocardial infarction) ([place]) 08/23/2013  . Chest pain 08/22/2013  . Essential hypertension 08/22/2013  . GERD (gastroesophageal reflux disease) 08/22/2013  . Anxiety 08/22/2013    Outpatient Encounter Prescriptions as of 08/27/2016  Medication Sig  . aspirin EC 81 MG tablet Take 1 tablet (81 mg total) by mouth daily. Resume on 04/06/2016.  Marland Kitchen atorvastatin (LIPITOR) 40 MG tablet Take 1 tablet (40 mg total) by mouth daily at 6 PM.  . b complex vitamins capsule Take 1 capsule by mouth daily.  . cetirizine (ZYRTEC) 10 MG  tablet Take 10 mg by mouth daily.  Marland Kitchen dexlansoprazole (DEXILANT) 60 MG capsule Take 1 capsule (60 mg total) by mouth daily.  . ferrous gluconate (FERGON) 324 MG tablet Take 1 tablet (324 mg total) by mouth daily with breakfast.  . HYDROcodone-acetaminophen (NORCO/VICODIN) 5-325 MG tablet Take 1 tablet by mouth every 12 (twelve) hours as needed for moderate pain.  . hydrocortisone (ANUSOL-HC) 2.5 % rectal cream Place 1 application rectally 2 (two) times daily.  Marland Kitchen lisinopril (PRINIVIL,ZESTRIL) 30 MG tablet Take 1 tablet (30 mg total) by mouth daily.  . metoprolol succinate (TOPROL-XL) 25 MG 24 hr tablet Take 0.5 tablets (12.5 mg total) by mouth daily.  . nitroGLYCERIN (NITROSTAT) 0.4 MG SL tablet Place 1 tablet (0.4 mg total) under the tongue every 5 (five) minutes as needed for chest pain.  Marland Kitchen psyllium (METAMUCIL) 58.6 % powder Take 1 packet by mouth daily.  . RABEprazole (ACIPHEX) 20 MG tablet Take 1 tablet (20 mg total) by mouth daily.   No facility-administered encounter medications on file as of 08/27/2016.     Allergies  Allergen Reactions  . Nsaids Other (See Comments)    AVOID all NSAID due to history of GI bleeding  . Codeine Other (See Comments)    Makes her feel "high"    Review of Systems  Constitutional: Negative for activity change, appetite change and unexpected weight change.  HENT: Negative for congestion, dental problem, postnasal drip and rhinorrhea.   Eyes: Negative for redness and visual disturbance.  Respiratory: Negative for cough  and shortness of breath.   Cardiovascular: Negative for chest pain, palpitations and leg swelling.  Gastrointestinal: Negative for abdominal pain, constipation and diarrhea.  Genitourinary: Negative for difficulty urinating, frequency and vaginal bleeding.  Musculoskeletal: Positive for back pain and gait problem. Negative for arthralgias.       Left knee pain. Occasional mid low back pain.  Neurological: Negative for dizziness and  headaches.  Psychiatric/Behavioral: Negative for dysphoric mood and sleep disturbance. The patient is not nervous/anxious.     BP (!) 144/80   Pulse 82   Temp 97.5 F (36.4 C) (Temporal)   Resp 18   Ht 5\' 5"  (1.651 m)   Wt 228 lb 1.9 oz (103.5 kg)   SpO2 98%   BMI 37.96 kg/m   Physical Exam BP (!) 144/80   Pulse 82   Temp 97.5 F (36.4 C) (Temporal)   Resp 18   Ht 5\' 5"  (1.651 m)   Wt 228 lb 1.9 oz (103.5 kg)   SpO2 98%   BMI 37.96 kg/m   General Appearance:    Alert, cooperative, no distress, appears stated age. Morbid obesity   Head:    Normocephalic, without obvious abnormality, atraumatic  Eyes:    PERRL, conjunctiva/corneas clear, EOM's intact, fundi    benign, both eyes  Ears:    Normal TM's and external ear canals, both ears  Nose:   Nares normal, septum midline, mucosa normal, no drainage    or sinus tenderness  Throat:   Lips, mucosa, and tongue normal;  gums normal  Neck:   Supple, symmetrical, trachea midline, no adenopathy;    thyroid:  no enlargement/tenderness/nodules; no carotid   bruit . Right supraclavicular area has visible and palpable pulsatile mass   Back:     Symmetric, no curvature, ROM normal, no CVA tenderness  Lungs:     Clear to auscultation bilaterally, respirations unlabored  Chest Wall:    No tenderness or deformity   Heart:    Regular rate and rhythm, S1 and S2 normal, no murmur, rub   or gallop  Breast Exam:    No tenderness, masses, or nipple abnormality. Axilla negative. Patient refuses mammogram.   Abdomen:     Soft, non-tender, bowel sounds active all four quadrants,    no masses, no organomegaly  Extremities:   Extremities normal, atraumatic, no cyanosis or edema. Left knee has crepitus, warmth, medial joint line tenderness   Pulses:   2+ and symmetric all extremities  Skin:   Skin color, texture, turgor normal, no rashes or lesions  Lymph nodes:   Cervical, supraclavicular, and axillary nodes normal  Neurologic:   normal  strength, sensation and reflexes    Throughout.    ASSESSMENT/PLAN:  1. Mass of right side of neck  - US Carotid Duplex Bilateral; Future  2. Primary osteoarthritis of left knee  - Ambulatory referral to Orthopedic Surgery  3. Annual visit for general adult medical examination with abnormal findings   4. Essential hypertension   5. Iron deficiency anemia, unspecified iron deficiency anemia type   6. Morbid obesity ([place])    Patient Instructions  I have referred for injection of the left knee  I have referred for doppler ultrasound of neck  Take the iron daily Drink plenty of water  Return for lab tests and a follow up in 3 months     Raylene Everts, MD

## 2016-09-11 ENCOUNTER — Telehealth: Payer: Self-pay | Admitting: Gastroenterology

## 2016-09-11 MED ORDER — CLOPIDOGREL BISULFATE 75 MG PO TABS: 75.0000 mg | ORAL_TABLET | Freq: Every day | ORAL | 3 refills | Status: DC

## 2016-09-11 NOTE — Telephone Encounter (Signed)
PLEASE CALL PT. HER LOW BLOOD COUNT/IRON ARE DUE TO ENTERITIS. THE ASPIRIN IS CAUSING HER TO LOSE BLOOD BECAUSE IT IS IRRITATING HER SMALL BOWEL. SHE SHOULD STOP THE ASA AND USE PLAVIX 75 MG DAILY BECAUSE SHE HAS HEART DISEASE. SHE SHOULD DISCUSS WITH [name] IF HE WANTS TO MAKE ANY OTHER ADJUSTMENTS IN HER HEART MEDS.

## 2016-09-11 NOTE — Procedures (Signed)
PRE-OPERATIVE DIAGNOSIS:  IRON DEFICIENCY ANEMIA.  POST-OPERATIVE DIAGNOSIS: FEDA DUE TO PAN-ENTERITIS  PROCEDURE:  Procedure(s): GIVENS CAPSULE STUDY  SURGEON:  Surgeon(s): Dorothyann Peng, MD  PATIENT DATA: 229 LBS, 76 IN, WAIST: 93 IN, GASTRIC PASSAGE TIME: 0 HR 27 m, SB PASSAGE TIME: 1H 26m  RESULTS: LIMITED views of gastric mucosa due to retained contents. No blood in the stomach. Multiple EROSIONS SEEN beginning 34% of SB PT and extending to the ileum ( 72% of SB PT). No OLD/FRESH BLOOD, masses or AVMs SEEN. LIMITED VIEWS OF THE COLON DUE TO RETAINED CONTENTS.  DIAGNOSIS: ENTERITIS DUE TO ASPIRIN USE  Plan: 1. STOP ASPIRIN. CHANGE TO PLAVIX. SEE CARDIOLOGY FOR FOLLOW UP. 2. CBC IN 2 MOS 3. FOLLOW UP IN 4 MOS.

## 2016-09-12 ENCOUNTER — Telehealth: Payer: Self-pay | Admitting: Family Medicine

## 2016-09-12 NOTE — Telephone Encounter (Signed)
Patient still having pain in the L knee.  She states she is working today, but her  job does not require a lot of standing.  She doesn't know if it is swollen because she says that she has big knees.  Patient has appointment with Dr. Rodell Perna (ortho) this coming Thursday at [facility] office. She is asking for something to help with pain (such as injection). Please advise if there is something she can do until she is seen.

## 2016-09-12 NOTE — Telephone Encounter (Signed)
Mailing a letter to call.  

## 2016-09-12 NOTE — Telephone Encounter (Signed)
Called and home phone many rings and no answer, mobile, mail box full and could not leave a message.

## 2016-09-12 NOTE — Telephone Encounter (Signed)
Called maat, states she sees dr Lorin Mercy next week. Does not want stronger pain med.

## 2016-09-12 NOTE — Telephone Encounter (Signed)
No.  I cannot inject - hoer old records show many injections and she is above the limit in my experience.  Ice, rest, tylenol 1000 mg every 4 h.  If not strong enough we can prescribe tramadol, but might make drowsy.  2. When is her neck ultrasound scheduled???

## 2016-09-16 ENCOUNTER — Telehealth: Payer: Self-pay | Admitting: Cardiovascular Disease

## 2016-09-16 ENCOUNTER — Other Ambulatory Visit: Payer: Self-pay

## 2016-09-16 MED ORDER — HYDROCODONE-ACETAMINOPHEN 5-325 MG PO TABS: 1.0000 | ORAL_TABLET | ORAL | 0 refills | Status: DC | PRN

## 2016-09-16 NOTE — Telephone Encounter (Signed)
Agree 

## 2016-09-16 NOTE — Telephone Encounter (Signed)
Pt aware - does have f/u scheduled with Dr Oneida Alar for bleeding - will update medication list

## 2016-09-16 NOTE — Telephone Encounter (Signed)
Pt says Dr Oneida Alar told her to d/c ASA but to continue taking plavix (has had rectal bleeding) wanted to know from Dr Bronson Ing

## 2016-09-16 NOTE — Telephone Encounter (Signed)
PT is aware and will discuss with Dr. Jacinta Shoe.

## 2016-09-16 NOTE — Telephone Encounter (Signed)
Seen 4 11 18

## 2016-09-16 NOTE — Telephone Encounter (Signed)
Patient called because her PCP, Dr fields, instructed her to stop taking aspirin as of today due to bleeding issues.  She would like to know what to do about taking  Plavix.

## 2016-09-18 ENCOUNTER — Ambulatory Visit (INDEPENDENT_AMBULATORY_CARE_PROVIDER_SITE_OTHER): Payer: Medicare Other

## 2016-09-18 ENCOUNTER — Encounter (INDEPENDENT_AMBULATORY_CARE_PROVIDER_SITE_OTHER): Payer: Self-pay | Admitting: Orthopaedic Surgery

## 2016-09-18 ENCOUNTER — Ambulatory Visit (INDEPENDENT_AMBULATORY_CARE_PROVIDER_SITE_OTHER): Payer: Medicare Other | Admitting: Orthopaedic Surgery

## 2016-09-18 VITALS — BP 146/63 | HR 83 | Ht 65.0 in | Wt 223.0 lb

## 2016-09-18 DIAGNOSIS — G8929 Other chronic pain: Secondary | ICD-10-CM

## 2016-09-18 DIAGNOSIS — M170 Bilateral primary osteoarthritis of knee: Secondary | ICD-10-CM

## 2016-09-18 DIAGNOSIS — M25562 Pain in left knee: Principal | ICD-10-CM

## 2016-09-18 NOTE — Progress Notes (Addendum)
Office Visit Note   Patient: Shelly [name]           Date of Birth: 14-Aug-1940           MRN: [id number] Visit Date: 09/18/2016              Requested by: Raylene Everts, MD [phone], [place] [address] PCP: Raylene Everts, MD   Assessment & Plan: Visit Diagnoses:  1. Chronic pain of left knee   2. Bilateral primary osteoarthritis of knee     Plan: Patient has end-stage knee osteoarthritis with severe erosive changes and is symptomatic. We discussed treatment options including total joint arthroplasty. We discussed zoster leg raising exercises to strengthen her quad which should not aggravate her knee osteoarthritis. We'll check her back in in 4 weeks to discuss this further. She understands she needs so knee arthroplasty but is hesitant to proceed. Recheck 4 weeks.Left knee was injected which she tolerated well cortisone hopefully this will give her some relief and improve ambulation  Follow-Up Instructions: Return in about 4 weeks (around 10/16/2016).   Orders:  Orders Placed This Encounter  Procedures  . Large Joint Injection/Arthrocentesis  . XR Knee 1-2 Views Left   Meds ordered this encounter  Medications  . bupivacaine (MARCAINE) 0.25 % (with pres) injection 0.66 mL  . lidocaine (XYLOCAINE) 1 % (with pres) injection 1 mL  . methylPREDNISolone acetate (DEPO-MEDROL) injection 40 mg      Procedures: Large Joint Inj Date/Time: 09/21/2016 7:23 PM Performed by: Marybelle Killings Authorized by: Rodell Perna C   Consent Given by:  Patient Site marked: the procedure site was marked   Indications:  Pain and joint swelling Location:  Knee Site:  L knee Needle Size:  22 G Needle Length:  1.5 inches Approach:  Anterolateral Ultrasound Guidance: No   Fluoroscopic Guidance: No   Arthrogram: No   Medications:  1 mL lidocaine 1 %; 40 mg methylPREDNISolone acetate 40 MG/ML; 0.66 mL bupivacaine 0.25 % Aspiration Attempted: No   Patient tolerance:   Patient tolerated the procedure well with no immediate complications     Clinical Data: No additional findings.   Subjective: Chief Complaint  Patient presents with  . Left Knee - Pain    HPI patient seen with the bilateral knee pain worse on the left than right. She is using hydrocodone for the pain also Tylenol. She's had many years of pain and is having more difficulty with activities of daily living such as going to the store and ambulating in the community. No recent falls. Increased pain with swelling after increased walking. She has problems with stairs.  Review of Systems 14 point review of systems updated and is positive for GERD, STEMI, coronary artery disease, heart block, stage III kidney disease, morbid obesity, anxiety, lower GI bleed. Positive for glaucoma.   Objective: Vital Signs: BP (!) 146/63   Pulse 83   Ht 5\' 5"  (1.651 m)   Wt 223 lb (101.2 kg)   BMI 37.11 kg/m   Physical Exam  Constitutional: She is oriented to person, place, and time. She appears well-developed.  HENT:  Head: Normocephalic.  Right Ear: External ear normal.  Left Ear: External ear normal.  Eyes: Pupils are equal, round, and reactive to light.  Neck: No tracheal deviation present. No thyromegaly present.  Cardiovascular: Normal rate.   Pulmonary/Chest: Effort normal.  Abdominal: Soft.  Musculoskeletal:  Patient has good hip range of motion. Left greater  than right knee effusion with severe crepitus.  Neurological: She is alert and oriented to person, place, and time.  Skin: Skin is warm and dry.  Psychiatric: She has a normal mood and affect. Her behavior is normal.    Ortho Exam left greater than right knee varus deformity with medial laxity of collateral ligament. Anterior cruciate ligament PCL exam is normal. Ankle range of motion is normal.  Specialty Comments:  No specialty comments available.  Imaging: No results found.   PMFS History: Patient Active Problem List    Diagnosis Date Noted  . Iron deficiency anemia 08/27/2016  . Morbid obesity ([place]) 08/27/2016  . CKD (chronic kidney disease), stage III 04/04/2016  . Osteoarthritis left knee 04/04/2016  . Third degree heart block ([place]) 12/10/2015  . Lower GI bleeding 12/09/2015  . Syncope and collapse 12/09/2015  . Pancolonic diverticulosis 12/09/2015  . CAD (coronary artery disease) 01/11/2014  . NSTEMI (non-ST elevated myocardial infarction) ([place]) 08/23/2013  . Chest pain 08/22/2013  . Essential hypertension 08/22/2013  . GERD (gastroesophageal reflux disease) 08/22/2013  . Anxiety 08/22/2013   Past Medical History:  Diagnosis Date  . Allergy   . Anemia    GI bleed  . Anginal pain ([place])   . Anxiety   . Arthritis   . Blood transfusion without reported diagnosis   . Cataract   . Chronic kidney disease   . Coronary artery disease    SEVERE  . GERD (gastroesophageal reflux disease)   . GI bleed 12/2013  . Goiter   . Hyperlipidemia   . Hypertension   . Myocardial infarction ([place]) 08/2013  . Pancolonic diverticulosis 12/2013    Family History  Problem Relation Age of Onset  . Hypertension Brother   . Cancer Mother     unsure of type   . Asthma Sister   . Stroke Sister   . Early death Brother   . Early death Brother   . Cancer Daughter     liver    Past Surgical History:  Procedure Laterality Date  . COLONOSCOPY N/A 01/13/2014   Dr. Hung:pandiverticulosis   . COLONOSCOPY N/A 12/14/2015   Dr. Michail Sermon: pancolonic diverticulosis, internal hemorrhoids   . CORNEAL TRANSPLANT    . CORONARY ANGIOPLASTY  08/2013  . ESOPHAGOGASTRODUODENOSCOPY N/A 12/14/2015   Dr. Michail Sermon: normal   . GIVENS CAPSULE STUDY N/A 08/13/2016   Procedure: GIVENS CAPSULE STUDY;  Surgeon: Danie Binder, MD;  Location: [facility];  Service: Endoscopy;  Laterality: N/A;  830   . HEEL SPUR EXCISION    . LEFT HEART CATHETERIZATION WITH CORONARY ANGIOGRAM N/A 08/23/2013   Procedure: LEFT HEART CATHETERIZATION WITH  CORONARY ANGIOGRAM;  Surgeon: Peter M Martinique, MD;  Location: [facility] CATH LAB;  Service: Cardiovascular;  Laterality: N/A;  . PERCUTANEOUS CORONARY STENT INTERVENTION (PCI-S) N/A 08/24/2013   Procedure: PERCUTANEOUS CORONARY STENT INTERVENTION (PCI-S);  Surgeon: Peter M Martinique, MD;  Location: [facility] CATH LAB;  Service: Cardiovascular;  Laterality: N/A;  . TONSILLECTOMY    . 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 Smoking status: Never Smoker  . Smokeless tobacco: Never Used  . Alcohol use No  . Drug use: No  . Sexual activity: No

## 2016-09-21 DIAGNOSIS — M1712 Unilateral primary osteoarthritis, left knee: Secondary | ICD-10-CM

## 2016-09-21 DIAGNOSIS — M25562 Pain in left knee: Secondary | ICD-10-CM

## 2016-09-21 DIAGNOSIS — G8929 Other chronic pain: Secondary | ICD-10-CM | POA: Diagnosis not present

## 2016-09-21 MED ORDER — BUPIVACAINE HCL 0.25 % IJ SOLN
0.6600 mL | INTRAMUSCULAR | Status: AC | PRN
  Administered 2016-09-21: .66 mL via INTRA_ARTICULAR

## 2016-09-21 MED ORDER — LIDOCAINE HCL 1 % IJ SOLN
1.0000 mL | INTRAMUSCULAR | Status: AC | PRN
  Administered 2016-09-21: 1 mL

## 2016-09-21 MED ORDER — METHYLPREDNISOLONE ACETATE 40 MG/ML IJ SUSP
40.0000 mg | INTRAMUSCULAR | Status: AC | PRN
  Administered 2016-09-21: 40 mg via INTRA_ARTICULAR

## 2016-09-23 ENCOUNTER — Telehealth: Payer: Self-pay | Admitting: Family Medicine

## 2016-09-23 NOTE — Telephone Encounter (Signed)
Patient requesting pain Rx  for knees.  Please call patient when ready to pick up.  [phone]

## 2016-09-23 NOTE — Telephone Encounter (Signed)
Spoke to [place], aware we have a rx for pain meds for her, but she needs to pick it up here.

## 2016-09-30 ENCOUNTER — Ambulatory Visit (INDEPENDENT_AMBULATORY_CARE_PROVIDER_SITE_OTHER): Payer: Medicare Other | Admitting: Gastroenterology

## 2016-09-30 ENCOUNTER — Encounter: Payer: Self-pay | Admitting: Gastroenterology

## 2016-09-30 VITALS — BP 159/74 | HR 75 | Temp 97.3°F | Ht 65.0 in | Wt 227.8 lb

## 2016-09-30 DIAGNOSIS — K219 Gastro-esophageal reflux disease without esophagitis: Secondary | ICD-10-CM

## 2016-09-30 DIAGNOSIS — D508 Other iron deficiency anemias: Secondary | ICD-10-CM

## 2016-09-30 MED ORDER — DEXLANSOPRAZOLE 60 MG PO CPDR: 60.0000 mg | DELAYED_RELEASE_CAPSULE | Freq: Every day | ORAL | 3 refills | Status: DC

## 2016-09-30 NOTE — Progress Notes (Deleted)
No rectal bleeding. Rare stomach ache "every now and then". Takes Metamucil, which makes her "growl" up here and there. Aciphex not working well. Could tell a big difference while on Dexilant.

## 2016-09-30 NOTE — Patient Instructions (Signed)
Please have blood work done in June when you get the others done by Dr. Meda Coffee.  We will see you in 6 months!

## 2016-09-30 NOTE — Progress Notes (Signed)
Referring Provider: Raylene Everts, MD Primary Care Physician:  Raylene Everts, MD Primary GI: Dr. Oneida Alar   Chief Complaint  Patient presents with  . Blood In Stools    f/u, no blood since stopped ASA about 1 month ago  . Gastroesophageal Reflux    Aciphex not helping as much as Dexilant    HPI:   Shelly [name] is a 76 y.o. female presenting today with a history of  GI bleeding, recently inpatient in Nov 2017. EGD and colonoscopy July 2017 by Dr. Michail Sermon with normal EGD and colonoscopy showing pancolonic diverticulosis. Presumed diverticular bleed at that time. Nov 2017 readmitted with suspected diverticular bleed in setting of oral diclofenac. Due to IDA, capsule study was completed. This showed enteritis due to aspirin use. Recommended discontinuing aspirin and changing to plavix. She is off of aspirin now.   Has tried Protonix, Prilosec, Nexium. Dexilant provided but too expensive. No rectal bleeding. Rare stomach ache "every now and then". Takes Metamucil, which makes her "growl" up here and there. Aciphex not working well. Could tell a big difference while on Dexilant.   Past Medical History:  Diagnosis Date  . Allergy   . Anemia    GI bleed  . Anginal pain ([place])   . Anxiety   . Arthritis   . Blood transfusion without reported diagnosis   . Cataract   . Chronic kidney disease   . Coronary artery disease    SEVERE  . GERD (gastroesophageal reflux disease)   . GI bleed 12/2013  . Goiter   . Hyperlipidemia   . Hypertension   . Myocardial infarction ([place]) 08/2013  . Pancolonic diverticulosis 12/2013    Past Surgical History:  Procedure Laterality Date  . COLONOSCOPY N/A 01/13/2014   Dr. Hung:pandiverticulosis   . COLONOSCOPY N/A 12/14/2015   Dr. Michail Sermon: pancolonic diverticulosis, internal hemorrhoids   . CORNEAL TRANSPLANT    . CORONARY ANGIOPLASTY  08/2013  . ESOPHAGOGASTRODUODENOSCOPY N/A 12/14/2015   Dr. Michail Sermon: normal   . GIVENS CAPSULE STUDY N/A  08/13/2016   Dr. Oneida Alar: enteritis due to aspirin.   Marland Kitchen HEEL SPUR EXCISION    . LEFT HEART CATHETERIZATION WITH CORONARY ANGIOGRAM N/A 08/23/2013   Procedure: LEFT HEART CATHETERIZATION WITH CORONARY ANGIOGRAM;  Surgeon: Peter M Martinique, MD;  Location: [facility] CATH LAB;  Service: Cardiovascular;  Laterality: N/A;  . PERCUTANEOUS CORONARY STENT INTERVENTION (PCI-S) N/A 08/24/2013   Procedure: PERCUTANEOUS CORONARY STENT INTERVENTION (PCI-S);  Surgeon: Peter M Martinique, MD;  Location: [facility] CATH LAB;  Service: Cardiovascular;  Laterality: N/A;  . TONSILLECTOMY    . TUBAL LIGATION      Current Outpatient Prescriptions  Medication Sig Dispense Refill  . atorvastatin (LIPITOR) 40 MG tablet Take 1 tablet (40 mg total) by mouth daily at 6 PM. 90 tablet 3  . b complex vitamins capsule Take 1 capsule by mouth daily.    . cetirizine (ZYRTEC) 10 MG tablet Take 10 mg by mouth as needed.     . clopidogrel (PLAVIX) 75 MG tablet Take 1 tablet (75 mg total) by mouth daily. 30 tablet 3  . ferrous gluconate (FERGON) 324 MG tablet Take 1 tablet (324 mg total) by mouth daily with breakfast. 90 tablet 0  . HYDROcodone-acetaminophen (NORCO/VICODIN) 5-325 MG tablet Take 1 tablet by mouth every 4 (four) hours as needed for moderate pain. 20 tablet 0  . lisinopril (PRINIVIL,ZESTRIL) 30 MG tablet Take 1 tablet (30 mg total) by mouth daily. 30 tablet 6  .  metoprolol succinate (TOPROL-XL) 25 MG 24 hr tablet Take 0.5 tablets (12.5 mg total) by mouth daily. 45 tablet 3  . nitroGLYCERIN (NITROSTAT) 0.4 MG SL tablet Place 1 tablet (0.4 mg total) under the tongue every 5 (five) minutes as needed for chest pain. 25 tablet 3  . psyllium (METAMUCIL) 58.6 % powder Take 1 packet by mouth daily.    . RABEprazole (ACIPHEX) 20 MG tablet Take 1 tablet (20 mg total) by mouth daily. 90 tablet 3  . dexlansoprazole (DEXILANT) 60 MG capsule Take 1 capsule (60 mg total) by mouth daily. 90 capsule 3   No current facility-administered medications for this  visit.     Allergies as of 09/30/2016 - Review Complete 09/30/2016  Allergen Reaction Noted  . Nsaids Other (See Comments) 07/25/2016  . Codeine Other (See Comments)     Family History  Problem Relation Age of Onset  . Hypertension Brother   . Cancer Mother        unsure of type   . Asthma Sister   . Stroke Sister   . Early death Brother   . Early death Brother   . Cancer Daughter        liver    Social History   Social History  . Marital status: Widowed    Spouse name: N/A  . Number of children: 8  . Years of education: 34   Occupational History  . 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 Smoking status: Never Smoker  . Smokeless tobacco: Never Used  . Alcohol use No  . Drug use: No  . Sexual activity: No   Other Topics Concern  . None   Social History Narrative   Works in a group home   Full time   Lives with daughter   Two grand daughters    One son home most of the time    Review of Systems: As mentioned in HPI   Physical Exam: BP (!) 159/74   Pulse 75   Temp 97.3 F (36.3 C) (Oral)   Ht 5\' 5"  (1.651 m)   Wt 227 lb 12.8 oz (103.3 kg)   BMI 37.91 kg/m  General:   Alert and oriented. No distress noted. Pleasant and cooperative.  Head:  Normocephalic and atraumatic. Eyes:  Conjuctiva clear without scleral icterus. Mouth:  Oral mucosa pink and moist.  Abdomen:  +BS, soft, non-tender and non-distended. No rebound or guarding. No HSM or masses noted. Msk:  Symmetrical without gross deformities.  Extremities:  Without edema. Neurologic:  Alert and  oriented x4;  grossly normal neurologically. Psych:  Alert and cooperative. Normal mood and affect.  Lab Results  Component Value Date   WBC 5.4 07/11/2016   HGB 8.9 (L) 07/11/2016   HCT 29.0 (L) 07/11/2016   MCV 88.4 07/11/2016   PLT 352 07/11/2016   Lab Results  Component Value Date   IRON 20 (L) 07/23/2016   TIBC 295 07/23/2016   FERRITIN 11 (L) 07/23/2016

## 2016-10-01 NOTE — Progress Notes (Signed)
cc'ed to pcp °

## 2016-10-01 NOTE — Assessment & Plan Note (Signed)
76 year old female with EGD/TCS on file from last year and capsule study showing enteritis in the setting of aspirin use. She has since come off of aspirin and changed to plavix. Will need to recheck CBC, iron, ferritin, TIBC in June. No overt GI bleeding. Return in 6 months.

## 2016-10-01 NOTE — Assessment & Plan Note (Signed)
Did best with Dexilant; however, this has been expensive. Will send in again, as I wonder if this is a prior authorization issue. In interim, samples provided. She has tried/failed Prilosec, Protonix, Nexium, and Aciphex.

## 2016-10-02 ENCOUNTER — Ambulatory Visit (INDEPENDENT_AMBULATORY_CARE_PROVIDER_SITE_OTHER): Payer: Medicare Other

## 2016-10-02 VITALS — BP 160/80 | HR 81 | Temp 98.7°F | Ht 65.0 in | Wt 231.1 lb

## 2016-10-02 DIAGNOSIS — Z23 Encounter for immunization: Secondary | ICD-10-CM | POA: Diagnosis not present

## 2016-10-02 DIAGNOSIS — Z0000 Encounter for general adult medical examination without abnormal findings: Secondary | ICD-10-CM

## 2016-10-02 NOTE — Progress Notes (Signed)
Subjective:   SOUNDRA LAMPLEY is a 76 y.o. female who presents for an Initial Medicare Annual Wellness Visit.  Review of Systems:  Cardiac Risk Factors include: advanced age (>53men, >63 women);hypertension;obesity (BMI >30kg/m2);sedentary lifestyle;dyslipidemia     Objective:   Patient had not yet taken her BP medication this morning. She normally takes her medication around 10 am when she eats breakfast.  Today's Vitals   10/02/16 0923 10/02/16 0928  BP: (!) 160/80   Pulse: 81   Temp: 98.7 F (37.1 C)   TempSrc: Oral   SpO2: 95%   Weight: 231 lb 1.3 oz (104.8 kg)   Height: 5\' 5"  (1.651 m)   PainSc:  8    Body mass index is 38.45 kg/m.   Current Medications (verified) Outpatient Encounter Prescriptions as of 10/02/2016  Medication Sig  . atorvastatin (LIPITOR) 40 MG tablet Take 1 tablet (40 mg total) by mouth daily at 6 PM.  . b complex vitamins capsule Take 1 capsule by mouth daily.  . cetirizine (ZYRTEC) 10 MG tablet Take 10 mg by mouth as needed.   . clopidogrel (PLAVIX) 75 MG tablet Take 1 tablet (75 mg total) by mouth daily.  Marland Kitchen dexlansoprazole (DEXILANT) 60 MG capsule Take 1 capsule (60 mg total) by mouth daily.  . ferrous gluconate (FERGON) 324 MG tablet Take 1 tablet (324 mg total) by mouth daily with breakfast.  . HYDROcodone-acetaminophen (NORCO/VICODIN) 5-325 MG tablet Take 1 tablet by mouth every 4 (four) hours as needed for moderate pain.  Marland Kitchen lisinopril (PRINIVIL,ZESTRIL) 30 MG tablet Take 1 tablet (30 mg total) by mouth daily.  . metoprolol succinate (TOPROL-XL) 25 MG 24 hr tablet Take 0.5 tablets (12.5 mg total) by mouth daily.  . nitroGLYCERIN (NITROSTAT) 0.4 MG SL tablet Place 1 tablet (0.4 mg total) under the tongue every 5 (five) minutes as needed for chest pain.  Marland Kitchen psyllium (METAMUCIL) 58.6 % powder Take 1 packet by mouth daily.  . [DISCONTINUED] RABEprazole (ACIPHEX) 20 MG tablet Take 1 tablet (20 mg total) by mouth daily.   No facility-administered  encounter medications on file as of 10/02/2016.     Allergies (verified) Nsaids and Codeine   History: Past Medical History:  Diagnosis Date  . Allergy   . Anemia    GI bleed  . Anginal pain ([place])   . Anxiety   . Arthritis   . Blood transfusion without reported diagnosis   . Cataract   . Chronic kidney disease   . Coronary artery disease    SEVERE  . GERD (gastroesophageal reflux disease)   . GI bleed 12/2013  . Goiter   . Hyperlipidemia   . Hypertension   . Myocardial infarction ([place]) 08/2013  . Pancolonic diverticulosis 12/2013   Past Surgical History:  Procedure Laterality Date  . COLONOSCOPY N/A 01/13/2014   Dr. Hung:pandiverticulosis   . COLONOSCOPY N/A 12/14/2015   Dr. Michail Sermon: pancolonic diverticulosis, internal hemorrhoids   . CORNEAL TRANSPLANT    . CORONARY ANGIOPLASTY  08/2013  . ESOPHAGOGASTRODUODENOSCOPY N/A 12/14/2015   Dr. Michail Sermon: normal   . GIVENS CAPSULE STUDY N/A 08/13/2016   Dr. Oneida Alar: enteritis due to aspirin.   Marland Kitchen HEEL SPUR EXCISION    . LEFT HEART CATHETERIZATION WITH CORONARY ANGIOGRAM N/A 08/23/2013   Procedure: LEFT HEART CATHETERIZATION WITH CORONARY ANGIOGRAM;  Surgeon: Peter M Martinique, MD;  Location: [facility] CATH LAB;  Service: Cardiovascular;  Laterality: N/A;  . PERCUTANEOUS CORONARY STENT INTERVENTION (PCI-S) N/A 08/24/2013   Procedure: PERCUTANEOUS CORONARY STENT  INTERVENTION (PCI-S);  Surgeon: Peter M Martinique, MD;  Location: [facility] CATH LAB;  Service: Cardiovascular;  Laterality: N/A;  . TONSILLECTOMY    . TUBAL LIGATION     Family History  Problem Relation Age of Onset  . Hypertension Brother   . Transient ischemic attack Brother   . Cancer Mother        unsure of type   . Asthma Sister   . Stroke Sister   . Stroke Sister   . Early death Brother   . Early death Brother   . Liver cancer Daughter   . Hernia Son        Umbilical  . Pancreatic disease Daughter        pancreatectomy  . Diverticulitis Daughter   . Anemia Daughter   . 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 Smoking status: Never Smoker  . Smokeless tobacco: Never Used  . Alcohol use No  . Drug use: No  . Sexual activity: No    Tobacco Counseling Counseling given: Not Answered   Activities of Daily Living In your present state of health, do you have any difficulty performing the following activities: 10/02/2016 08/27/2016  Hearing? N N  Vision? Y N  Difficulty concentrating or making decisions? Y N  Walking or climbing stairs? Y N  Dressing or bathing? N N  Doing errands, shopping? Y N  Preparing Food and eating ? N -  Using the Toilet? N -  In the past six months, have you accidently leaked urine? N -  Do you have problems with loss of bowel control? N -  Managing your Medications? N -  Managing your Finances? N -  Housekeeping or managing your Housekeeping? N -  Some recent data might be hidden    Immunizations and Health Maintenance Immunization History  Administered Date(s) Administered  . Pneumococcal Conjugate-13 10/02/2016  . Pneumococcal Polysaccharide-23 08/25/2013   There are no preventive care reminders to display for this patient.  Patient Care Team: Raylene Everts, MD as PCP - General (Family Medicine) Herminio Commons, MD as Attending Physician (Cardiology) Danie Binder, MD as Consulting Physician (Gastroenterology)  Indicate any recent Medical Services you may have received from other than [facility] providers in the past year (date may be approximate).     Assessment:   This is a routine wellness examination for Seanna.  Hearing/Vision screen  Visual Acuity Screening   Right eye Left eye Both eyes  Without correction: 20/30 20/30 20/25   With correction:     Comments: Has upcomming appointment with [facility]    Annette Stable issues and exercise activities discussed: Current Exercise Habits: The patient does not participate in  regular exercise at present, Exercise limited by: orthopedic condition(s)  Goals    . Blood Pressure < 140/90    . Exercise 3x per week (30 min per time)          Obese, recommend starting a routine exercise program by doing chair exercises at least 3 days a week for 30-45 minutes at a time as tolerated.      . Weight (lb) < 200 lb (90.7 kg)      Depression Screen PHQ 2/9 Scores 10/02/2016 08/27/2016  PHQ - 2 Score 1 0    Fall Risk Fall Risk  10/02/2016 08/27/2016 07/25/2016  Falls in the past year? No  No No    Cognitive Function: Normal by direct observation   Screening Tests Health Maintenance  Topic Date Due  . TETANUS/TDAP  10/03/2026 (Originally 06/14/1959)  . INFLUENZA VACCINE  12/17/2016  . COLONOSCOPY  12/13/2025  . DEXA SCAN  Completed  . PNA vac Low Risk Adult  Completed      Plan:   I have personally reviewed and noted the following in the patient's chart:   . Medical and social history . Use of alcohol, tobacco or illicit drugs  . Current medications and supplements . Functional ability and status . Nutritional status . Physical activity . Advanced directives . List of other physicians . Hospitalizations, surgeries, and ER visits in previous 12 months . Vitals . Screenings to include cognitive, depression, and falls . Referrals and appointments: [employer] referral sent today  In addition, I have reviewed and discussed with patient certain preventive protocols, quality metrics, and best practice recommendations. A written personalized care plan for preventive services as well as general preventive health recommendations were provided to patient.     Stormy Fabian, LPN   09/24/3265

## 2016-10-02 NOTE — Patient Instructions (Addendum)
Shelly [name] , Thank you for taking time to come for your Medicare Wellness Visit. I appreciate your ongoing commitment to your health goals. Please review the following plan we discussed and let me know if I can assist you in the future.   Screening recommendations/referrals: Colonoscopy: Up to date and no longer required Mammogram: No longer required Bone Density: Up to date Recommended yearly ophthalmology/optometry visit for glaucoma screening and checkup Recommended yearly dental visit for hygiene and checkup  Vaccinations: Influenza vaccine: Will be due 12/2016 Pneumococcal vaccine: Due for prevnar 13, administered today  Tdap vaccine: Due, please check with your insurance company to see if this is a covered vaccine Shingles vaccine: Due, please check with your insurance company to see if this is a covered vaccine   Advanced directives: Advance directive discussed with you today. I have provided a copy for you to complete at home and have notarized. Once this is complete please bring a copy in to our office so we can scan it into your chart.  Conditions/risks identified: Obese, recommend starting a routine exercise program at least 3 days a week for 30-45 minutes at a time as tolerated.   Next appointment: Follow up with Dr. Meda Coffee on 11/27/2016 at 10:40 am. Follow up in 1 year for your annual wellness visit.   Preventive Care 72 Years and Older, Female Preventive care refers to lifestyle choices and visits with your health care provider that can promote health and wellness. What does preventive care include?  A yearly physical exam. This is also called an annual well check.  Dental exams once or twice a year.  Routine eye exams. Ask your health care provider how often you should have your eyes checked.  Personal lifestyle choices, including:  Daily care of your teeth and gums.  Regular physical activity.  Eating a healthy diet.  Avoiding tobacco and drug use.  Limiting  alcohol use.  Practicing safe sex.  Taking low-dose aspirin every day.  Taking vitamin and mineral supplements as recommended by your health care provider. What happens during an annual well check? The services and screenings done by your health care provider during your annual well check will depend on your age, overall health, lifestyle risk factors, and family history of disease. Counseling  Your health care provider may ask you questions about your:  Alcohol use.  Tobacco use.  Drug use.  Emotional well-being.  Home and relationship well-being.  Sexual activity.  Eating habits.  History of falls.  Memory and ability to understand (cognition).  Work and work Statistician.  Reproductive health. Screening  You may have the following tests or measurements:  Height, weight, and BMI.  Blood pressure.  Lipid and cholesterol levels. These may be checked every 5 years, or more frequently if you are over 60 years old.  Skin check.  Lung cancer screening. You may have this screening every year starting at age 18 if you have a 30-pack-year history of smoking and currently smoke or have quit within the past 15 years.  Fecal occult blood test (FOBT) of the stool. You may have this test every year starting at age 44.  Flexible sigmoidoscopy or colonoscopy. You may have a sigmoidoscopy every 5 years or a colonoscopy every 10 years starting at age 28.  Hepatitis C blood test.  Hepatitis B blood test.  Sexually transmitted disease (STD) testing.  Diabetes screening. This is done by checking your blood sugar (glucose) after you have not eaten for a while (fasting). You  may have this done every 1-3 years.  Bone density scan. This is done to screen for osteoporosis. You may have this done starting at age 39.  Mammogram. This may be done every 1-2 years. Talk to your health care provider about how often you should have regular mammograms. Talk with your health care provider  about your test results, treatment options, and if necessary, the need for more tests. Vaccines  Your health care provider may recommend certain vaccines, such as:  Influenza vaccine. This is recommended every year.  Tetanus, diphtheria, and acellular pertussis (Tdap, Td) vaccine. You may need a Td booster every 10 years.  Zoster vaccine. You may need this after age 23.  Pneumococcal 13-valent conjugate (PCV13) vaccine. One dose is recommended after age 43.  Pneumococcal polysaccharide (PPSV23) vaccine. One dose is recommended after age 36. Talk to your health care provider about which screenings and vaccines you need and how often you need them. This information is not intended to replace advice given to you by your health care provider. Make sure you discuss any questions you have with your health care provider. Document Released: 06/01/2015 Document Revised: 01/23/2016 Document Reviewed: 03/06/2015 Elsevier Interactive Patient Education  2017 [place] Prevention in the Home Falls can cause injuries. They can happen to people of all ages. There are many things you can do to make your home safe and to help prevent falls. What can I do on the outside of my home?  Regularly fix the edges of walkways and driveways and fix any cracks.  Remove anything that might make you trip as you walk through a door, such as a raised step or threshold.  Trim any bushes or trees on the path to your home.  Use bright outdoor lighting.  Clear any walking paths of anything that might make someone trip, such as rocks or tools.  Regularly check to see if handrails are loose or broken. Make sure that both sides of any steps have handrails.  Any raised decks and porches should have guardrails on the edges.  Have any leaves, snow, or ice cleared regularly.  Use sand or salt on walking paths during winter.  Clean up any spills in your garage right away. This includes oil or grease  spills. What can I do in the bathroom?  Use night lights.  Install grab bars by the toilet and in the tub and shower. Do not use towel bars as grab bars.  Use non-skid mats or decals in the tub or shower.  If you need to sit down in the shower, use a plastic, non-slip stool.  Keep the floor dry. Clean up any water that spills on the floor as soon as it happens.  Remove soap buildup in the tub or shower regularly.  Attach bath mats securely with double-sided non-slip rug tape.  Do not have throw rugs and other things on the floor that can make you trip. What can I do in the bedroom?  Use night lights.  Make sure that you have a light by your bed that is easy to reach.  Do not use any sheets or blankets that are too big for your bed. They should not hang down onto the floor.  Have a firm chair that has side arms. You can use this for support while you get dressed.  Do not have throw rugs and other things on the floor that can make you trip. What can I do in the kitchen?  Clean  up any spills right away.  Avoid walking on wet floors.  Keep items that you use a lot in easy-to-reach places.  If you need to reach something above you, use a strong step stool that has a grab bar.  Keep electrical cords out of the way.  Do not use floor polish or wax that makes floors slippery. If you must use wax, use non-skid floor wax.  Do not have throw rugs and other things on the floor that can make you trip. What can I do with my stairs?  Do not leave any items on the stairs.  Make sure that there are handrails on both sides of the stairs and use them. Fix handrails that are broken or loose. Make sure that handrails are as long as the stairways.  Check any carpeting to make sure that it is firmly attached to the stairs. Fix any carpet that is loose or worn.  Avoid having throw rugs at the top or bottom of the stairs. If you do have throw rugs, attach them to the floor with carpet  tape.  Make sure that you have a light switch at the top of the stairs and the bottom of the stairs. If you do not have them, ask someone to add them for you. What else can I do to help prevent falls?  Wear shoes that:  Do not have high heels.  Have rubber bottoms.  Are comfortable and fit you well.  Are closed at the toe. Do not wear sandals.  If you use a stepladder:  Make sure that it is fully opened. Do not climb a closed stepladder.  Make sure that both sides of the stepladder are locked into place.  Ask someone to hold it for you, if possible.  Clearly mark and make sure that you can see:  Any grab bars or handrails.  First and last steps.  Where the edge of each step is.  Use tools that help you move around (mobility aids) if they are needed. These include:  Canes.  Walkers.  Scooters.  Crutches.  Turn on the lights when you go into a dark area. Replace any light bulbs as soon as they burn out.  Set up your furniture so you have a clear path. Avoid moving your furniture around.  If any of your floors are uneven, fix them.  If there are any pets around you, be aware of where they are.  Review your medicines with your doctor. Some medicines can make you feel dizzy. This can increase your chance of falling. Ask your doctor what other things that you can do to help prevent falls. This information is not intended to replace advice given to you by your health care provider. Make sure you discuss any questions you have with your health care provider. Document Released: 03/01/2009 Document Revised: 10/11/2015 Document Reviewed: 06/09/2014 Elsevier Interactive Patient Education  2017 [employer].

## 2016-10-09 NOTE — Progress Notes (Signed)
REVIEWED-NO ADDITIONAL RECOMMENDATIONS. 

## 2016-10-15 ENCOUNTER — Telehealth: Payer: Self-pay | Admitting: Gastroenterology

## 2016-10-15 NOTE — Telephone Encounter (Signed)
Noted. We will need to come up with a longer term plan, as Dexilant is too expensive. She has tried multiple PPIs. Does she recall which one did well for her historically?

## 2016-10-15 NOTE — Telephone Encounter (Signed)
I called the pharmacist at [facility] and she does not required PA. Her insurance is paying about $50.00 and her part is $245.72.  I am leaving samples at front for pt and she is aware.  Dexilant 60 mg #15.

## 2016-10-15 NOTE — Telephone Encounter (Signed)
[phone] PATIENT CALLED WANTING TO KNOW IF SHE CAN HAVE SOME MORE SAMPLES OF DEXILANT

## 2016-10-16 ENCOUNTER — Ambulatory Visit (INDEPENDENT_AMBULATORY_CARE_PROVIDER_SITE_OTHER): Payer: Medicare Other | Admitting: Orthopaedic Surgery

## 2016-10-16 MED ORDER — PANTOPRAZOLE SODIUM 40 MG PO TBEC: 40.0000 mg | DELAYED_RELEASE_TABLET | Freq: Every day | ORAL | 3 refills | Status: DC

## 2016-10-16 NOTE — Telephone Encounter (Signed)
Pt is aware.  

## 2016-10-16 NOTE — Telephone Encounter (Signed)
Pt does not know what worked for her. She is willing to try what you would like her to try.

## 2016-10-16 NOTE — Telephone Encounter (Signed)
I have sent in Protonix to take once each morning. Let me know how this does.

## 2016-10-16 NOTE — Addendum Note (Signed)
Addended by: Annitta Needs on: 10/16/2016 11:28 AM   Modules accepted: Orders

## 2016-10-30 ENCOUNTER — Telehealth: Payer: Self-pay | Admitting: Family Medicine

## 2016-10-30 ENCOUNTER — Ambulatory Visit (INDEPENDENT_AMBULATORY_CARE_PROVIDER_SITE_OTHER): Payer: Medicare Other | Admitting: Orthopaedic Surgery

## 2016-10-30 ENCOUNTER — Telehealth: Payer: Self-pay

## 2016-10-30 NOTE — Telephone Encounter (Signed)
Yes, that's fine 

## 2016-10-30 NOTE — Telephone Encounter (Signed)
Patient calling about Rx Dexilant.  She states this is the Rx that works for her, but it is so expensive (over $200) and her insurance only pays $50.00.  She is asking if there are samples that are available or any type of assistance in paying for this Rx. Please advise.  Patient aware that Dr Meda Coffee will not be in until next week.     cb  [phone]

## 2016-10-30 NOTE — Telephone Encounter (Signed)
Pt is calling to see if she can get more samples of Dexilant. Please advise

## 2016-11-03 ENCOUNTER — Other Ambulatory Visit: Payer: Self-pay

## 2016-11-03 MED ORDER — FERROUS GLUCONATE 324 (38 FE) MG PO TABS: 324.0000 mg | ORAL_TABLET | Freq: Every day | ORAL | 3 refills | Status: DC

## 2016-11-03 NOTE — Telephone Encounter (Signed)
Pt is aware. Samples are at the front desk. I have also put dexilant patient assistance forms in the bag for pt to fill out and return to [place]. Pt is aware of this.

## 2016-11-18 ENCOUNTER — Emergency Department (HOSPITAL_COMMUNITY): Payer: Medicare Other

## 2016-11-18 ENCOUNTER — Emergency Department (HOSPITAL_COMMUNITY)
Admission: EM | Admit: 2016-11-18 | Discharge: 2016-11-19 | Disposition: A | Discharge status: Home / Self Care | Payer: Medicare Other | Attending: Emergency Medicine | Admitting: Emergency Medicine

## 2016-11-18 ENCOUNTER — Encounter (HOSPITAL_COMMUNITY): Payer: Self-pay | Admitting: Emergency Medicine

## 2016-11-18 DIAGNOSIS — M25511 Pain in right shoulder: Secondary | ICD-10-CM | POA: Diagnosis not present

## 2016-11-18 DIAGNOSIS — M79621 Pain in right upper arm: Secondary | ICD-10-CM | POA: Diagnosis not present

## 2016-11-18 DIAGNOSIS — N183 Chronic kidney disease, stage 3 (moderate): Secondary | ICD-10-CM | POA: Diagnosis not present

## 2016-11-18 DIAGNOSIS — Z79899 Other long term (current) drug therapy: Secondary | ICD-10-CM | POA: Insufficient documentation

## 2016-11-18 DIAGNOSIS — Z7902 Long term (current) use of antithrombotics/antiplatelets: Secondary | ICD-10-CM | POA: Diagnosis not present

## 2016-11-18 DIAGNOSIS — I252 Old myocardial infarction: Secondary | ICD-10-CM | POA: Insufficient documentation

## 2016-11-18 DIAGNOSIS — I129 Hypertensive chronic kidney disease with stage 1 through stage 4 chronic kidney disease, or unspecified chronic kidney disease: Secondary | ICD-10-CM | POA: Diagnosis not present

## 2016-11-18 DIAGNOSIS — I2510 Atherosclerotic heart disease of native coronary artery without angina pectoris: Secondary | ICD-10-CM | POA: Insufficient documentation

## 2016-11-18 DIAGNOSIS — R52 Pain, unspecified: Secondary | ICD-10-CM

## 2016-11-18 NOTE — ED Triage Notes (Signed)
Pt c/o right arm pain since Monday and denies any injury to the area. Pt states the pain is worse if she tries to move the arm.

## 2016-11-19 MED ORDER — MORPHINE SULFATE (PF) 4 MG/ML IV SOLN
4.0000 mg | Freq: Once | INTRAVENOUS | Status: AC
  Administered 2016-11-19: 4 mg via INTRAMUSCULAR
  Filled 2016-11-19: qty 1

## 2016-11-19 MED ORDER — ONDANSETRON 4 MG PO TBDP
4.0000 mg | ORAL_TABLET | Freq: Once | ORAL | Status: AC
  Administered 2016-11-19: 4 mg via ORAL
  Filled 2016-11-19: qty 1

## 2016-11-19 MED ORDER — HYDROCODONE-ACETAMINOPHEN 5-325 MG PO TABS: ORAL_TABLET | ORAL | 0 refills | Status: DC

## 2016-11-19 NOTE — ED Provider Notes (Signed)
[facility] DEPT Provider Note   CSN: [id number] Arrival date & time: 11/18/16  2113     History   Chief Complaint Chief Complaint  Patient presents with  . Arm Pain    HPI Shelly [name] is a 76 y.o. female.  HPI  Shelly [name] is a 76 y.o. female who presents to the Emergency Department complaining of right shoulder pain for two days.  She describes a throbbing pain to her right shoulder, right neck  and upper arm that is associated with movement and improves at rest.  She denies known injury, but states that she continues to work.  Pain improved early with Tylenol.  pain is worse with raising the right arm.  She denies chest pain, shortness of breath, fever, numbness or weakness of the extremity.  Takes hydrocodone for chronic knee pain and states that she cannot take NSAID due to hx of GI bleeding.   Past Medical History:  Diagnosis Date  . Allergy   . Anemia    GI bleed  . Anginal pain ([place])   . Anxiety   . Arthritis   . Blood transfusion without reported diagnosis   . Cataract   . Chronic kidney disease   . Coronary artery disease    SEVERE  . GERD (gastroesophageal reflux disease)   . GI bleed 12/2013  . Goiter   . Hyperlipidemia   . Hypertension   . Myocardial infarction ([place]) 08/2013  . Pancolonic diverticulosis 12/2013    Patient Active Problem List   Diagnosis Date Noted  . Iron deficiency anemia 08/27/2016  . Morbid obesity ([place]) 08/27/2016  . CKD (chronic kidney disease), stage III 04/04/2016  . Osteoarthritis left knee 04/04/2016  . Third degree heart block ([place]) 12/10/2015  . Lower GI bleeding 12/09/2015  . Syncope and collapse 12/09/2015  . Pancolonic diverticulosis 12/09/2015  . CAD (coronary artery disease) 01/11/2014  . NSTEMI (non-ST elevated myocardial infarction) ([place]) 08/23/2013  . IDA (iron deficiency anemia) 08/23/2013  . Chest pain 08/22/2013  . Essential hypertension 08/22/2013  . GERD (gastroesophageal reflux disease)  08/22/2013  . Anxiety 08/22/2013    Past Surgical History:  Procedure Laterality Date  . COLONOSCOPY N/A 01/13/2014   Dr. Hung:pandiverticulosis   . COLONOSCOPY N/A 12/14/2015   Dr. Michail Sermon: pancolonic diverticulosis, internal hemorrhoids   . CORNEAL TRANSPLANT    . CORONARY ANGIOPLASTY  08/2013  . ESOPHAGOGASTRODUODENOSCOPY N/A 12/14/2015   Dr. Michail Sermon: normal   . GIVENS CAPSULE STUDY N/A 08/13/2016   Dr. Oneida Alar: enteritis due to aspirin.   Marland Kitchen HEEL SPUR EXCISION    . LEFT HEART CATHETERIZATION WITH CORONARY ANGIOGRAM N/A 08/23/2013   Procedure: LEFT HEART CATHETERIZATION WITH CORONARY ANGIOGRAM;  Surgeon: Peter M Martinique, MD;  Location: [facility] CATH LAB;  Service: Cardiovascular;  Laterality: N/A;  . PERCUTANEOUS CORONARY STENT INTERVENTION (PCI-S) N/A 08/24/2013   Procedure: PERCUTANEOUS CORONARY STENT INTERVENTION (PCI-S);  Surgeon: Peter M Martinique, MD;  Location: [facility] CATH LAB;  Service: Cardiovascular;  Laterality: N/A;  . TONSILLECTOMY    . TUBAL LIGATION      OB History    No data available       Home Medications    Prior to Admission medications   Medication Sig Start Date End Date Taking? Authorizing Provider  atorvastatin (LIPITOR) 40 MG tablet Take 1 tablet (40 mg total) by mouth daily at 6 PM. 08/27/16   Raylene Everts, MD  b complex vitamins capsule Take 1 capsule by mouth daily.  [provider]  cetirizine (ZYRTEC) 10 MG tablet Take 10 mg by mouth as needed.     [provider]  clopidogrel (PLAVIX) 75 MG tablet Take 1 tablet (75 mg total) by mouth daily. 09/11/16   Fields, Marga Melnick, MD  dexlansoprazole (DEXILANT) 60 MG capsule Take 1 capsule (60 mg total) by mouth daily. 09/30/16   Annitta Needs, NP  ferrous gluconate (FERGON) 324 MG tablet Take 1 tablet (324 mg total) by mouth daily with breakfast. 11/03/16   Raylene Everts, MD  HYDROcodone-acetaminophen (NORCO/VICODIN) 5-325 MG tablet Take one tab po q 4-6 hrs prn pain 11/19/16   Reign Bartnick, PA-C    lisinopril (PRINIVIL,ZESTRIL) 30 MG tablet Take 1 tablet (30 mg total) by mouth daily. 08/06/16   Herminio Commons, MD  metoprolol succinate (TOPROL-XL) 25 MG 24 hr tablet Take 0.5 tablets (12.5 mg total) by mouth daily. 08/18/16   Herminio Commons, MD  nitroGLYCERIN (NITROSTAT) 0.4 MG SL tablet Place 1 tablet (0.4 mg total) under the tongue every 5 (five) minutes as needed for chest pain. 04/16/15   Herminio Commons, MD  pantoprazole (PROTONIX) 40 MG tablet Take 1 tablet (40 mg total) by mouth daily. 10/16/16   Annitta Needs, NP  psyllium (METAMUCIL) 58.6 % powder Take 1 packet by mouth daily.    [provider]    Family History Family History  Problem Relation Age of Onset  . Hypertension Brother   . Transient ischemic attack Brother   . Cancer Mother        unsure of type   . Asthma Sister   . Stroke Sister   . Stroke Sister   . Early death Brother   . Early death Brother   . Liver cancer Daughter   . Hernia Son        Umbilical  . Pancreatic disease Daughter        pancreatectomy  . Diverticulitis Daughter   . Anemia Daughter   . GER disease Son     Social History Social History  Substance Use Topics  . Smoking status: Never Smoker  . Smokeless tobacco: Never Used  . Alcohol use No     Allergies   Nsaids and Codeine   Review of Systems Review of Systems  Constitutional: Negative for chills and fever.  Respiratory: Negative for chest tightness and shortness of breath.   Cardiovascular: Negative for chest pain.  Gastrointestinal: Negative for abdominal pain, nausea and vomiting.  Musculoskeletal: Positive for arthralgias (right shoulder pain) and neck pain (right neck pain). Negative for back pain and joint swelling.  Skin: Negative for color change and wound.  Neurological: Negative for dizziness, syncope, weakness, numbness and headaches.  All other systems reviewed and are negative.    Physical Exam Updated Vital Signs BP (!) 142/65  (BP Location: Left Arm)   Pulse 89   Temp 98.7 F (37.1 C) (Oral)   Resp 18   Ht 5\' 2"  (1.575 m)   Wt 99.8 kg (220 lb)   SpO2 98%   BMI 40.24 kg/m   Physical Exam  Constitutional: She is oriented to person, place, and time. She appears well-developed and well-nourished. No distress.  HENT:  Head: Atraumatic.  Mouth/Throat: Oropharynx is clear and moist.  Eyes: EOM are normal. Pupils are equal, round, and reactive to light.  Neck: Normal range of motion.  Cardiovascular: Normal rate, regular rhythm, normal heart sounds and intact distal pulses.   No murmur heard. Pulmonary/Chest:  Effort normal and breath sounds normal. She exhibits no tenderness.  Musculoskeletal: She exhibits tenderness. She exhibits no edema.       Cervical back: She exhibits tenderness.       Back:  Diffuse ttp of the right shoulder joint, right trapezius muscle and deltoid muscle.  No erythema or edema. Pain on abduction of the right arm. Grip strength strong and symmetrical    Neurological: She is alert and oriented to person, place, and time. No sensory deficit.  Skin: Skin is warm and dry. Capillary refill takes less than 2 seconds. No rash noted.  Psychiatric: She has a normal mood and affect.  Nursing note and vitals reviewed.    ED Treatments / Results  Labs (all labs ordered are listed, but only abnormal results are displayed) Labs Reviewed - No data to display  EKG  EKG Interpretation None       Radiology Dg Humerus Right  Result Date: 11/18/2016 CLINICAL DATA:  Right arm pain for 1 day.  No known injury. EXAM: RIGHT HUMERUS - 2+ VIEW COMPARISON:  04/04/2016 and prior chest radiographs FINDINGS: No acute fracture, subluxation or dislocation identified. Glenohumeral joint degenerative changes are present. Degenerative changes at the rotator cuff insertion noted. No suspicious focal bony lesions identified. IMPRESSION: No acute abnormality. Degenerative changes. Electronically Signed   By:  Margarette Canada M.D.   On: 11/18/2016 21:54    Procedures Procedures (including critical care time)  Medications Ordered in ED Medications  morphine 4 MG/ML injection 4 mg (4 mg Intramuscular Given 11/19/16 0021)  ondansetron (ZOFRAN-ODT) disintegrating tablet 4 mg (4 mg Oral Given 11/19/16 0021)     Initial Impression / Assessment and Plan / ED Course  I have reviewed the triage vital signs and the nursing notes.  Pertinent labs & imaging results that were available during my care of the patient were reviewed by me and considered in my medical decision making (see chart for details).     Pt with pain on ROM of the right shoulder.  NV intact.  No skin changes.  Sx's likely related to deg changes of the shoulder.  No concerning sx's for septic joint or cardiac process.   Pain improved after medication, pt is requesting discharge home.  Appears stable for d/c, prefers to f/u with her orthopedic provider in [place].      Final Clinical Impressions(s) / ED Diagnoses   Final diagnoses:  Pain  Acute pain of right shoulder    New Prescriptions Discharge Medication List as of 11/19/2016 12:55 AM       Kem Parkinson, PA-C 11/19/16 0156    Francine Graven, DO 11/23/16 1124

## 2016-11-19 NOTE — Discharge Instructions (Signed)
Call your primary care provider or Dr. Lorin Mercy to arrange a follow-up appt.  You can apply ice packs on/off to your shoulder if needed.

## 2016-11-20 DIAGNOSIS — I2510 Atherosclerotic heart disease of native coronary artery without angina pectoris: Secondary | ICD-10-CM | POA: Diagnosis not present

## 2016-11-20 DIAGNOSIS — K210 Gastro-esophageal reflux disease with esophagitis: Secondary | ICD-10-CM | POA: Diagnosis not present

## 2016-11-20 DIAGNOSIS — D508 Other iron deficiency anemias: Secondary | ICD-10-CM | POA: Diagnosis not present

## 2016-11-20 DIAGNOSIS — N183 Chronic kidney disease, stage 3 (moderate): Secondary | ICD-10-CM | POA: Diagnosis not present

## 2016-11-20 DIAGNOSIS — K5730 Diverticulosis of large intestine without perforation or abscess without bleeding: Secondary | ICD-10-CM | POA: Diagnosis not present

## 2016-11-20 LAB — COMPREHENSIVE METABOLIC PANEL
ALT: 8 U/L (ref 6–29)
AST: 10 U/L (ref 10–35)
Albumin: 3.5 g/dL — ABNORMAL LOW (ref 3.6–5.1)
Alkaline Phosphatase: 85 U/L (ref 33–130)
BUN: 22 mg/dL (ref 7–25)
CHLORIDE: 107 mmol/L (ref 98–110)
CO2: 26 mmol/L (ref 20–31)
Calcium: 9.2 mg/dL (ref 8.6–10.4)
Creat: 1.23 mg/dL — ABNORMAL HIGH (ref 0.60–0.93)
Glucose, Bld: 108 mg/dL — ABNORMAL HIGH (ref 65–99)
POTASSIUM: 4.4 mmol/L (ref 3.5–5.3)
Sodium: 140 mmol/L (ref 135–146)
TOTAL PROTEIN: 6.5 g/dL (ref 6.1–8.1)
Total Bilirubin: 0.4 mg/dL (ref 0.2–1.2)

## 2016-11-20 LAB — CBC
HCT: 33.5 % — ABNORMAL LOW (ref 35.0–45.0)
Hemoglobin: 10.5 g/dL — ABNORMAL LOW (ref 11.7–15.5)
MCH: 28.5 pg (ref 27.0–33.0)
MCHC: 31.3 g/dL — AB (ref 32.0–36.0)
MCV: 90.8 fL (ref 80.0–100.0)
MPV: 10.2 fL (ref 7.5–12.5)
PLATELETS: 283 10*3/uL (ref 140–400)
RBC: 3.69 MIL/uL — AB (ref 3.80–5.10)
RDW: 16.1 % — ABNORMAL HIGH (ref 11.0–15.0)
WBC: 6.1 10*3/uL (ref 3.8–10.8)

## 2016-11-20 LAB — LIPID PANEL
CHOL/HDL RATIO: 2.6 ratio (ref <5.0)
CHOLESTEROL: 140 mg/dL (ref <200)
HDL: 53 mg/dL (ref >50)
LDL CALC: 76 mg/dL (ref <100)
TRIGLYCERIDES: 54 mg/dL (ref <150)
VLDL: 11 mg/dL (ref <30)

## 2016-11-20 LAB — IRON AND TIBC
%SAT: 9 % — AB (ref 11–50)
IRON: 21 ug/dL — AB (ref 45–160)
TIBC: 238 ug/dL — AB (ref 250–450)
UIBC: 217 ug/dL

## 2016-11-21 ENCOUNTER — Encounter: Payer: Self-pay | Admitting: Family Medicine

## 2016-11-21 LAB — VITAMIN D 25 HYDROXY (VIT D DEFICIENCY, FRACTURES): Vit D, 25-Hydroxy: 36 ng/mL (ref 30–100)

## 2016-11-21 LAB — URINALYSIS, MICROSCOPIC ONLY
BACTERIA UA: NONE SEEN [HPF]
CRYSTALS: NONE SEEN [HPF]
Casts: NONE SEEN [LPF]
Yeast: NONE SEEN [HPF]

## 2016-11-21 LAB — URINALYSIS, ROUTINE W REFLEX MICROSCOPIC
Bilirubin Urine: NEGATIVE
Glucose, UA: NEGATIVE
Hgb urine dipstick: NEGATIVE
KETONES UR: NEGATIVE
Nitrite: NEGATIVE
PH: 5.5 (ref 5.0–8.0)
Protein, ur: NEGATIVE
SPECIFIC GRAVITY, URINE: 1.023 (ref 1.001–1.035)

## 2016-11-21 LAB — FERRITIN: Ferritin: 27 ng/mL (ref 20–288)

## 2016-11-25 NOTE — Progress Notes (Signed)
I think patient would benefit from iron infusion. Is she agreeable to this? Supplemental oral iron is not going to cut it in this scenario.

## 2016-11-25 NOTE — Progress Notes (Signed)
Paperwork on Shelly [name]'s desk.

## 2016-11-25 NOTE — Progress Notes (Signed)
PT is aware and agreeable to do the iron infusion.

## 2016-11-26 ENCOUNTER — Other Ambulatory Visit: Payer: Self-pay

## 2016-11-26 DIAGNOSIS — D509 Iron deficiency anemia, unspecified: Secondary | ICD-10-CM

## 2016-11-26 NOTE — Progress Notes (Signed)
Tried to call pt. Line busy. Orders faxed to [facility] for the iron infusion. Lab orders entered for labs approximately 01/09/2017.

## 2016-11-26 NOTE — Progress Notes (Signed)
Great. Feraheme IV X 1. Repeat CBC, iron, ferritin, TIBC in 6 weeks after infusion.

## 2016-11-27 ENCOUNTER — Ambulatory Visit (INDEPENDENT_AMBULATORY_CARE_PROVIDER_SITE_OTHER): Payer: Medicare Other | Admitting: Family Medicine

## 2016-11-27 ENCOUNTER — Encounter: Payer: Self-pay | Admitting: Family Medicine

## 2016-11-27 ENCOUNTER — Telehealth: Payer: Self-pay | Admitting: Gastroenterology

## 2016-11-27 VITALS — BP 140/74 | HR 60 | Temp 97.5°F | Resp 18 | Ht 62.0 in | Wt 228.0 lb

## 2016-11-27 DIAGNOSIS — I10 Essential (primary) hypertension: Secondary | ICD-10-CM | POA: Diagnosis not present

## 2016-11-27 DIAGNOSIS — M1712 Unilateral primary osteoarthritis, left knee: Secondary | ICD-10-CM

## 2016-11-27 DIAGNOSIS — K219 Gastro-esophageal reflux disease without esophagitis: Secondary | ICD-10-CM

## 2016-11-27 MED ORDER — CLOTRIMAZOLE-BETAMETHASONE 1-0.05 % EX CREA: 1.0000 "application " | TOPICAL_CREAM | Freq: Two times a day (BID) | CUTANEOUS | 0 refills | Status: DC

## 2016-11-27 MED ORDER — HYDROCODONE-ACETAMINOPHEN 5-325 MG PO TABS: ORAL_TABLET | ORAL | 0 refills | Status: DC

## 2016-11-27 NOTE — Telephone Encounter (Signed)
Paperwork on [facility] desk. 

## 2016-11-27 NOTE — Telephone Encounter (Signed)
Pt brought by paperwork. She had mailed in instead of bringing to the office for provider to complete.  Almyra Free has the paperwork now for the provider.

## 2016-11-27 NOTE — Telephone Encounter (Signed)
Paper work completed.

## 2016-11-27 NOTE — Patient Instructions (Addendum)
I am going to re order the pain medicine You may take one or two every day Take with food (prevent constipation)  Get ultrasound neck (ordered)  Continue other medicines See me in 2 months to see how pain management is doing

## 2016-11-27 NOTE — Telephone Encounter (Signed)
Faxed to pt assistance co.

## 2016-11-27 NOTE — Progress Notes (Signed)
Chief Complaint  Patient presents with  . Follow-up    3 month/ lab rev  routine follow up Is reminded to get the ultrasound of her neck.  She says it has "been like this for a Long time" Is here with her daughter We spent most of today discussing her chronic pain.  She has end stage osteoarthritis of her knee.  Is afraid to, and cannot afford to have a knee replacement.  She is unable to take any NSAID medicines.  The tylenol is not affording her adequate pain control.  When she takes the hydrocone 5 mg she can perform her activities better.  It makes her mildly dizzy.  She does not drive.  She takes this very infrequently.  We discussed she can safely take it more often - when needed.  We discussed it is a narcotic, and addicting if used daily.  She understands risks and wants to try to take it more often to function.  Will watch for constipation.  Is given 60 tabs and a follow up in 2 months. Her BP is good No cardiac symptom or SOB No appetite change or nausea, no heart burn or bleeding noted  Patient Active Problem List   Diagnosis Date Noted  . Iron deficiency anemia 08/27/2016  . Morbid obesity ([place]) 08/27/2016  . CKD (chronic kidney disease), stage III 04/04/2016  . Osteoarthritis left knee 04/04/2016  . Third degree heart block ([place]) 12/10/2015  . Lower GI bleeding 12/09/2015  . Syncope and collapse 12/09/2015  . Pancolonic diverticulosis 12/09/2015  . CAD (coronary artery disease) 01/11/2014  . NSTEMI (non-ST elevated myocardial infarction) ([place]) 08/23/2013  . IDA (iron deficiency anemia) 08/23/2013  . Chest pain 08/22/2013  . Essential hypertension 08/22/2013  . GERD (gastroesophageal reflux disease) 08/22/2013  . Anxiety 08/22/2013    Outpatient Encounter Prescriptions as of 11/27/2016  Medication Sig  . atorvastatin (LIPITOR) 40 MG tablet Take 1 tablet (40 mg total) by mouth daily at 6 PM.  . b complex vitamins capsule Take 1 capsule by mouth daily.  . cetirizine  (ZYRTEC) 10 MG tablet Take 10 mg by mouth as needed.   . clopidogrel (PLAVIX) 75 MG tablet Take 1 tablet (75 mg total) by mouth daily.  Marland Kitchen dexlansoprazole (DEXILANT) 60 MG capsule Take 1 capsule (60 mg total) by mouth daily.  . ferrous gluconate (FERGON) 324 MG tablet Take 1 tablet (324 mg total) by mouth daily with breakfast.  . HYDROcodone-acetaminophen (NORCO/VICODIN) 5-325 MG tablet Take one tab po q 4-6 hrs prn pain  . lisinopril (PRINIVIL,ZESTRIL) 30 MG tablet Take 1 tablet (30 mg total) by mouth daily.  . metoprolol succinate (TOPROL-XL) 25 MG 24 hr tablet Take 0.5 tablets (12.5 mg total) by mouth daily.  . nitroGLYCERIN (NITROSTAT) 0.4 MG SL tablet Place 1 tablet (0.4 mg total) under the tongue every 5 (five) minutes as needed for chest pain.  . pantoprazole (PROTONIX) 40 MG tablet Take 1 tablet (40 mg total) by mouth daily.  . psyllium (METAMUCIL) 58.6 % powder Take 1 packet by mouth daily.  . clotrimazole-betamethasone (LOTRISONE) cream Apply 1 application topically 2 (two) times daily.   No facility-administered encounter medications on file as of 11/27/2016.     Allergies  Allergen Reactions  . Nsaids Other (See Comments)    AVOID all NSAID due to history of GI bleeding    Review of Systems  Constitutional: Negative for activity change, appetite change and unexpected weight change.  HENT: Negative for congestion,  dental problem, postnasal drip and rhinorrhea.   Eyes: Negative for redness and visual disturbance.  Respiratory: Negative for cough and shortness of breath.   Cardiovascular: Negative for chest pain, palpitations and leg swelling.  Gastrointestinal: Negative for abdominal pain, constipation and diarrhea.  Genitourinary: Negative for difficulty urinating and frequency.  Musculoskeletal: Positive for arthralgias, gait problem and joint swelling. Negative for back pain.  Neurological: Negative for dizziness and headaches.  Psychiatric/Behavioral: Negative for dysphoric  mood and sleep disturbance. The patient is not nervous/anxious.      BP 140/74   Pulse 60   Temp (!) 97.5 F (36.4 C) (Temporal)   Resp 18   Ht 5\' 2"  (1.575 m)   Wt 228 lb 0.6 oz (103.4 kg)   SpO2 94%   BMI 41.71 kg/m   Physical Exam  Constitutional: She is oriented to person, place, and time. She appears well-developed.  Pleasant. Obese.  Antalgic gait.  HENT:  Head: Normocephalic and atraumatic.  Mouth/Throat: Oropharynx is clear and moist.  Eyes: Pupils are equal, round, and reactive to light. Conjunctivae are normal.  Neck: Normal range of motion. Neck supple.  Cardiovascular: Normal rate and regular rhythm.   Murmur heard. Pulmonary/Chest: Effort normal and breath sounds normal. No respiratory distress. She has no wheezes.  Musculoskeletal: Normal range of motion. She exhibits no edema.  Left knee with crepitus and warmth  Lymphadenopathy:    She has no cervical adenopathy.  Neurological: She is alert and oriented to person, place, and time.  Psychiatric: She has a normal mood and affect. Her behavior is normal. Thought content normal.    ASSESSMENT/PLAN:  1. Essential hypertension controlled  2. Primary osteoarthritis of left knee End stage  3. Gastroesophageal reflux disease, esophagitis presence not specified controlled on Dexilant   Patient Instructions  I am going to re order the pain medicine You may take one or two every day Take with food (prevent constipation)  Get ultrasound neck (ordered)  Continue other medicines See me in 2 months to see how pain management is doing    Raylene Everts, MD

## 2016-11-27 NOTE — Telephone Encounter (Signed)
Pt left message on VM that she needed to speak with SF. Please call her back at [phone]

## 2016-11-27 NOTE — Telephone Encounter (Signed)
I called pt and she said she needs to bring paperwork by that was done for her patient assistance for Dexilant. They are saying it was not completed. I am leaving 3 boxes of Dexilant at front for her.

## 2016-12-01 ENCOUNTER — Encounter (HOSPITAL_COMMUNITY): Payer: Self-pay

## 2016-12-01 ENCOUNTER — Telehealth: Payer: Self-pay

## 2016-12-01 ENCOUNTER — Encounter (HOSPITAL_COMMUNITY)
Admission: RE | Admit: 2016-12-01 | Discharge: 2016-12-01 | Disposition: A | Discharge status: Home / Self Care | Payer: Medicare Other | Source: Ambulatory Visit | Attending: Gastroenterology | Admitting: Gastroenterology

## 2016-12-01 DIAGNOSIS — D509 Iron deficiency anemia, unspecified: Secondary | ICD-10-CM | POA: Diagnosis not present

## 2016-12-01 MED ORDER — SODIUM CHLORIDE 0.9 % IV SOLN
INTRAVENOUS | Status: DC
  Administered 2016-12-01: 250 mL via INTRAVENOUS

## 2016-12-01 MED ORDER — SODIUM CHLORIDE 0.9 % IV SOLN
510.0000 mg | Freq: Once | INTRAVENOUS | Status: AC
  Administered 2016-12-01: 510 mg via INTRAVENOUS
  Filled 2016-12-01: qty 17

## 2016-12-01 NOTE — Discharge Instructions (Signed)

## 2016-12-01 NOTE — Telephone Encounter (Signed)
Pt left Vm asking Dr. Nona Dell office to give her a call, but did not state reason for call. I have tried both numbers and could not reach her.

## 2016-12-01 NOTE — Telephone Encounter (Signed)
She said she had spoke to someone last week. Everything is fine. Don't know why that call just came through on my VM.

## 2016-12-15 ENCOUNTER — Other Ambulatory Visit: Payer: Self-pay

## 2017-01-06 DIAGNOSIS — H52223 Regular astigmatism, bilateral: Secondary | ICD-10-CM | POA: Diagnosis not present

## 2017-01-06 DIAGNOSIS — H524 Presbyopia: Secondary | ICD-10-CM | POA: Diagnosis not present

## 2017-01-06 DIAGNOSIS — H5212 Myopia, left eye: Secondary | ICD-10-CM | POA: Diagnosis not present

## 2017-01-06 DIAGNOSIS — Z961 Presence of intraocular lens: Secondary | ICD-10-CM | POA: Diagnosis not present

## 2017-01-09 DIAGNOSIS — D509 Iron deficiency anemia, unspecified: Secondary | ICD-10-CM | POA: Diagnosis not present

## 2017-01-09 LAB — CBC WITH DIFFERENTIAL/PLATELET
Basophils Absolute: 0 cells/uL (ref 0–200)
Basophils Relative: 0 %
EOS ABS: 49 {cells}/uL (ref 15–500)
Eosinophils Relative: 1 %
HEMATOCRIT: 35.9 % (ref 35.0–45.0)
HEMOGLOBIN: 11.6 g/dL — AB (ref 11.7–15.5)
LYMPHS ABS: 1911 {cells}/uL (ref 850–3900)
Lymphocytes Relative: 39 %
MCH: 30.1 pg (ref 27.0–33.0)
MCHC: 32.3 g/dL (ref 32.0–36.0)
MCV: 93.2 fL (ref 80.0–100.0)
MONO ABS: 441 {cells}/uL (ref 200–950)
MONOS PCT: 9 %
MPV: 9.8 fL (ref 7.5–12.5)
NEUTROS ABS: 2499 {cells}/uL (ref 1500–7800)
Neutrophils Relative %: 51 %
PLATELETS: 269 10*3/uL (ref 140–400)
RBC: 3.85 MIL/uL (ref 3.80–5.10)
RDW: 15.9 % — ABNORMAL HIGH (ref 11.0–15.0)
WBC: 4.9 10*3/uL (ref 3.8–10.8)

## 2017-01-09 LAB — IRON AND TIBC
%SAT: 23 % (ref 11–50)
Iron: 46 ug/dL (ref 45–160)
TIBC: 196 ug/dL — AB (ref 250–450)
UIBC: 150 ug/dL

## 2017-01-10 LAB — FERRITIN: Ferritin: 113 ng/mL (ref 20–288)

## 2017-01-12 NOTE — Progress Notes (Signed)
Ferritin improved to 113. Hgb 11.6. How is she feeling? Can do ferrous sulfate 325 mg po BID if she tolerates this. Recheck CBC, ferritin in 3 months.

## 2017-01-13 ENCOUNTER — Other Ambulatory Visit: Payer: Self-pay

## 2017-01-13 DIAGNOSIS — D649 Anemia, unspecified: Secondary | ICD-10-CM

## 2017-01-13 NOTE — Progress Notes (Signed)
Pt is aware. Said she is feeling better. Not sure she can tolerate the iron bid, but will try. Lab orders on file for 04/15/2017.

## 2017-01-15 ENCOUNTER — Other Ambulatory Visit: Payer: Self-pay

## 2017-01-29 ENCOUNTER — Encounter: Payer: Self-pay | Admitting: Family Medicine

## 2017-01-29 ENCOUNTER — Ambulatory Visit (INDEPENDENT_AMBULATORY_CARE_PROVIDER_SITE_OTHER): Payer: Medicare Other | Admitting: Family Medicine

## 2017-01-29 VITALS — BP 142/80 | HR 64 | Temp 97.9°F | Resp 18 | Ht 62.0 in | Wt 232.0 lb

## 2017-01-29 DIAGNOSIS — I10 Essential (primary) hypertension: Secondary | ICD-10-CM | POA: Diagnosis not present

## 2017-01-29 DIAGNOSIS — K219 Gastro-esophageal reflux disease without esophagitis: Secondary | ICD-10-CM

## 2017-01-29 DIAGNOSIS — K5730 Diverticulosis of large intestine without perforation or abscess without bleeding: Secondary | ICD-10-CM

## 2017-01-29 DIAGNOSIS — M1712 Unilateral primary osteoarthritis, left knee: Secondary | ICD-10-CM | POA: Diagnosis not present

## 2017-01-29 MED ORDER — HYDROCODONE-ACETAMINOPHEN 5-325 MG PO TABS: ORAL_TABLET | ORAL | 0 refills | Status: DC

## 2017-01-29 MED ORDER — NEOMYCIN-POLYMYXIN-HC 3.5-10000-1 OT SUSP: 3.0000 [drp] | Freq: Three times a day (TID) | OTIC | 6 refills | Status: DC

## 2017-01-29 NOTE — Patient Instructions (Signed)
See me in six months Try the ear drop Call for problems  See me in 3 months

## 2017-01-29 NOTE — Progress Notes (Signed)
Chief Complaint  Patient presents with  . 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  She uses tylenol but at times needs hydrocodone for pain.  Does not take Daily.   BP is good No chest pain or DOE Is on a restricted diet for diverticulosis.  We discuss that she needs to continue fiber and metamucil and "roughage" but does not need to avoid seeds, fresh fruits and vegetables. GERD is controlled.  Sees GI Refuses flu shots Has gained weight since July.  Discussed importance of li=osing weight for her health conditions and knee pain Has very itchy ears and wants an ear drop  Patient Active Problem List   Diagnosis Date Noted  . Iron deficiency anemia 08/27/2016  . Morbid obesity ([place]) 08/27/2016  . CKD (chronic kidney disease), stage III 04/04/2016  . Osteoarthritis left knee 04/04/2016  . Third degree heart block ([place]) 12/10/2015  . Lower GI bleeding 12/09/2015  . Pancolonic diverticulosis 12/09/2015  . CAD (coronary artery disease) 01/11/2014  . NSTEMI (non-ST elevated myocardial infarction) ([place]) 08/23/2013  . IDA (iron deficiency anemia) 08/23/2013  . Chest pain 08/22/2013  . Essential hypertension 08/22/2013  . GERD (gastroesophageal reflux disease) 08/22/2013  . Anxiety 08/22/2013    Outpatient Encounter Prescriptions as of 01/29/2017  Medication Sig  . atorvastatin (LIPITOR) 40 MG tablet Take 1 tablet (40 mg total) by mouth daily at 6 PM.  . b complex vitamins capsule Take 1 capsule by mouth daily.  . cetirizine (ZYRTEC) 10 MG tablet Take 10 mg by mouth as needed.   . clopidogrel (PLAVIX) 75 MG tablet Take 1 tablet (75 mg total) by mouth daily.  . clotrimazole-betamethasone (LOTRISONE) cream Apply 1 application topically 2 (two) times daily.  Marland Kitchen dexlansoprazole (DEXILANT) 60 MG capsule Take 1 capsule (60 mg total) by mouth daily.    . ferrous gluconate (FERGON) 324 MG tablet Take 1 tablet (324 mg total) by mouth daily with breakfast.  . HYDROcodone-acetaminophen (NORCO/VICODIN) 5-325 MG tablet Take one tab po q 4-6 hrs prn pain  . lisinopril (PRINIVIL,ZESTRIL) 30 MG tablet Take 1 tablet (30 mg total) by mouth daily.  . metoprolol succinate (TOPROL-XL) 25 MG 24 hr tablet Take 0.5 tablets (12.5 mg total) by mouth daily.  . nitroGLYCERIN (NITROSTAT) 0.4 MG SL tablet Place 1 tablet (0.4 mg total) under the tongue every 5 (five) minutes as needed for chest pain.  . pantoprazole (PROTONIX) 40 MG tablet Take 1 tablet (40 mg total) by mouth daily.  . psyllium (METAMUCIL) 58.6 % powder Take 1 packet by mouth daily.  . [DISCONTINUED] HYDROcodone-acetaminophen (NORCO/VICODIN) 5-325 MG tablet Take one tab po q 4-6 hrs prn pain  . neomycin-polymyxin-hydrocortisone (CORTISPORIN) 3.5-10000-1 OTIC suspension Place 3 drops into both ears 3 (three) times daily.   No facility-administered encounter medications on file as of 01/29/2017.     Allergies  Allergen Reactions  . Nsaids Other (See Comments)    AVOID all NSAID due to history of GI bleeding    Review of Systems  Constitutional: Negative for activity change, appetite change and unexpected weight change.  HENT: Positive for congestion. Negative for dental problem, ear discharge, ear pain, postnasal drip and rhinorrhea.        Some sinus congestion with allergies, ears itch  Eyes: Negative for redness and visual disturbance.  Respiratory: Negative for cough  and shortness of breath.   Cardiovascular: Negative for chest pain, palpitations and leg swelling.  Gastrointestinal: Negative for abdominal pain, constipation and diarrhea.  Genitourinary: Negative for difficulty urinating and frequency.  Musculoskeletal: Positive for arthralgias, gait problem and joint swelling. Negative for back pain.  Neurological: Negative for dizziness and headaches.  Psychiatric/Behavioral: Negative  for dysphoric mood and sleep disturbance. The patient is not nervous/anxious.     BP (!) 168/82 (BP Location: Right Arm, Patient Position: Sitting, Cuff Size: Large)   Pulse 64   Temp 97.9 F (36.6 C) (Temporal)   Resp 18   Ht 5\' 2"  (1.575 m)   Wt 232 lb (105.2 kg)   SpO2 98%   BMI 42.43 kg/m   Physical Exam  Constitutional: She is oriented to person, place, and time. She appears well-developed.  Pleasant. Obese.  Antalgic gait.  HENT:  Head: Normocephalic and atraumatic.  Right Ear: External ear normal.  Left Ear: External ear normal.  Mouth/Throat: Oropharynx is clear and moist.  Canals clear, TMs normal  Eyes: Pupils are equal, round, and reactive to light. Conjunctivae are normal.  Neck: Normal range of motion. Neck supple.  Cardiovascular: Normal rate and regular rhythm.   Murmur heard. Pulmonary/Chest: Effort normal and breath sounds normal. No respiratory distress. She has no wheezes.  Musculoskeletal: Normal range of motion. She exhibits no edema.  Left knee with crepitus and warmth  Lymphadenopathy:    She has no cervical adenopathy.  Neurological: She is alert and oriented to person, place, and time.  Psychiatric: She has a normal mood and affect. Her behavior is normal. Thought content normal.    ASSESSMENT/PLAN:  1. Essential hypertension controlled  2. Primary osteoarthritis of left knee Rx hydrocodone with precautions  3. Gastroesophageal reflux disease, esophagitis presence not specified stable  4. Pancolonic diverticulosis Diet reviewed  5. Morbid obesity (HCC) Worse, discussed diet, portions. snacking 6. Flu shot refused  Patient Instructions  See me in six months Try the ear drop Call for problems  See me in 3 months     Raylene Everts, MD

## 2017-02-04 ENCOUNTER — Ambulatory Visit (INDEPENDENT_AMBULATORY_CARE_PROVIDER_SITE_OTHER): Payer: Medicare Other | Admitting: Cardiovascular Disease

## 2017-02-04 ENCOUNTER — Encounter: Payer: Self-pay | Admitting: Cardiovascular Disease

## 2017-02-04 VITALS — BP 152/72 | HR 69 | Ht 62.0 in | Wt 228.0 lb

## 2017-02-04 DIAGNOSIS — I25118 Atherosclerotic heart disease of native coronary artery with other forms of angina pectoris: Secondary | ICD-10-CM | POA: Diagnosis not present

## 2017-02-04 DIAGNOSIS — E7800 Pure hypercholesterolemia, unspecified: Secondary | ICD-10-CM | POA: Diagnosis not present

## 2017-02-04 DIAGNOSIS — I10 Essential (primary) hypertension: Secondary | ICD-10-CM | POA: Diagnosis not present

## 2017-02-04 DIAGNOSIS — Z955 Presence of coronary angioplasty implant and graft: Secondary | ICD-10-CM

## 2017-02-04 DIAGNOSIS — I252 Old myocardial infarction: Secondary | ICD-10-CM

## 2017-02-04 MED ORDER — AMLODIPINE BESYLATE 5 MG PO TABS: 5.0000 mg | ORAL_TABLET | Freq: Every day | ORAL | 6 refills | Status: DC

## 2017-02-04 MED ORDER — LISINOPRIL 40 MG PO TABS: 40.0000 mg | ORAL_TABLET | Freq: Every day | ORAL | 6 refills | Status: DC

## 2017-02-04 NOTE — Patient Instructions (Signed)
Medication Instructions:   Increase Lisinopril to 40mg  daily.  Begin Amlodipine 5mg  daily.  Continue all other medications.    Labwork: none  Testing/Procedures: none  Follow-Up: Your physician wants you to follow up in: 6 months.  You will receive a reminder letter in the mail one-two months in advance.  If you don't receive a letter, please call our office to schedule the follow up appointment   Any Other Special Instructions Will Be Listed Below (If Applicable).  If you need a refill on your cardiac medications before your next appointment, please call your pharmacy.

## 2017-02-04 NOTE — Progress Notes (Signed)
SUBJECTIVE: The patient presents for follow up of coronary artery disease. She is doing well overall with respect to heart disease. She has not had used nitroglycerin. She occasionally has some burning pains located retrosternally but this may last seconds.   She has significant left knee pain and received an injection but only had pain relief for a day. She tells me she was told she needs knee replacement surgery but she is reluctant to move forward with this.  Blood pressure in our office is 152/72. At her PCPs office it was 168/82.  Lipids 11/20/16: Total cholesterol 140, triglycerides 54, LDL 76, HDL 53.   Review of Systems: As per "subjective", otherwise negative.  Allergies  Allergen Reactions  . Nsaids Other (See Comments)    AVOID all NSAID due to history of GI bleeding    Current Outpatient Prescriptions  Medication Sig Dispense Refill  . atorvastatin (LIPITOR) 40 MG tablet Take 1 tablet (40 mg total) by mouth daily at 6 PM. 90 tablet 3  . b complex vitamins capsule Take 1 capsule by mouth daily.    . cetirizine (ZYRTEC) 10 MG tablet Take 10 mg by mouth as needed.     . clopidogrel (PLAVIX) 75 MG tablet Take 1 tablet (75 mg total) by mouth daily. 30 tablet 3  . clotrimazole-betamethasone (LOTRISONE) cream Apply 1 application topically 2 (two) times daily. 30 g 0  . dexlansoprazole (DEXILANT) 60 MG capsule Take 1 capsule (60 mg total) by mouth daily. 90 capsule 3  . ferrous gluconate (FERGON) 324 MG tablet Take 1 tablet (324 mg total) by mouth daily with breakfast. 90 tablet 3  . HYDROcodone-acetaminophen (NORCO/VICODIN) 5-325 MG tablet Take one tab po q 4-6 hrs prn pain 60 tablet 0  . lisinopril (PRINIVIL,ZESTRIL) 30 MG tablet Take 1 tablet (30 mg total) by mouth daily. 30 tablet 6  . metoprolol succinate (TOPROL-XL) 25 MG 24 hr tablet Take 0.5 tablets (12.5 mg total) by mouth daily. 45 tablet 3  . neomycin-polymyxin-hydrocortisone (CORTISPORIN) 3.5-10000-1 OTIC  suspension Place 3 drops into both ears 3 (three) times daily. 10 mL 6  . nitroGLYCERIN (NITROSTAT) 0.4 MG SL tablet Place 1 tablet (0.4 mg total) under the tongue every 5 (five) minutes as needed for chest pain. 25 tablet 3  . pantoprazole (PROTONIX) 40 MG tablet Take 1 tablet (40 mg total) by mouth daily. 90 tablet 3  . psyllium (METAMUCIL) 58.6 % powder Take 1 packet by mouth daily.     No current facility-administered medications for this visit.     Past Medical History:  Diagnosis Date  . Allergy   . Anemia    GI bleed  . Anginal pain ([place])   . Anxiety   . Arthritis   . Blood transfusion without reported diagnosis   . Cataract   . Chronic kidney disease   . Coronary artery disease    SEVERE  . GERD (gastroesophageal reflux disease)   . GI bleed 12/2013  . Goiter   . Hyperlipidemia   . Hypertension   . Myocardial infarction ([place]) 08/2013  . Pancolonic diverticulosis 12/2013    Past Surgical History:  Procedure Laterality Date  . COLONOSCOPY N/A 01/13/2014   Dr. Hung:pandiverticulosis   . COLONOSCOPY N/A 12/14/2015   Dr. Michail Sermon: pancolonic diverticulosis, internal hemorrhoids   . CORNEAL TRANSPLANT    . CORONARY ANGIOPLASTY  08/2013  . ESOPHAGOGASTRODUODENOSCOPY N/A 12/14/2015   Dr. Michail Sermon: normal   . GIVENS CAPSULE STUDY N/A 08/13/2016  Dr. Oneida Alar: enteritis due to aspirin.   Marland Kitchen HEEL SPUR EXCISION    . LEFT HEART CATHETERIZATION WITH CORONARY ANGIOGRAM N/A 08/23/2013   Procedure: LEFT HEART CATHETERIZATION WITH CORONARY ANGIOGRAM;  Surgeon: Peter M Martinique, MD;  Location[facility] C[facility] LAB;  Service: Cardiovascular;  Laterality: N/A;  . PERCUTANEOUS CORONARY STENT INTERVENTION (PCI-S) N/A 08/24/2013   Procedure: PERCUTANEOUS CORONARY STENT INTERVENTION (PCI-S);  Surgeon: Peter M Martinique, MD;  [facility] C[facility];  Service: Cardiovascular;  Laterality: N/A;  . TONSILLECTOMY    . TUBAL LIGATION      Social History   Social History  . Marital status: Widowed    Spouse name:  N/A  . Number of children: 8  . Years of education: 6   Occupational History  . 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 Smoking status: Never Smoker  . Smokeless tobacco: Never Used  . Alcohol use No  . Drug use: No  . Sexual activity: No   Other Topics Concern  . Not on file   Social History Narrative   Works in a group home   Full time   Lives with daughter   Two grand daughters    One son home most of the time     Vitals:   02/04/17 0850  BP: (!) 152/72  Pulse: 69  SpO2: 98%  Weight: 228 lb (103.4 kg)  Height: 5\' 2"  (1.575 m)    Wt Readings from Last 3 Encounters:  02/04/17 228 lb (103.4 kg)  01/29/17 232 lb (105.2 kg)  11/27/16 228 lb 0.6 oz (103.4 kg)     PHYSICAL EXAM General: NAD HEENT: Normal. Neck: No JVD, no thyromegaly. Lungs: Clear to auscultation bilaterally with normal respiratory effort. CV: Nondisplaced PMI.  Regular rate and rhythm, normal S1/S2, no S3/S4, no murmur. No pretibial or periankle edema.  No carotid bruit.   Abdomen: Soft, nontender, no distention.  Neurologic: Alert and oriented.  Psych: Normal affect. Skin: Normal. Musculoskeletal: No gross deformities.    ECG: Most recent ECG reviewed.   Labs: Lab Results  Component Value Date/Time   K 4.4 11/20/2016 08:54 AM   BUN 22 11/20/2016 08:54 AM   CREATININE 1.23 (H) 11/20/2016 08:54 AM   ALT 8 11/20/2016 08:54 AM   TSH 0.324 (L) 12/09/2015 08:33 AM   TSH 0.292 (L) 10/19/2013 10:24 AM   HGB 11.6 (L) 01/09/2017 09:15 AM     Lipids: Lab Results  Component Value Date/Time   LDLCALC 76 11/20/2016 08:54 AM   CHOL 140 11/20/2016 08:54 AM   TRIG 54 11/20/2016 08:54 AM   HDL 53 11/20/2016 08:54 AM       ASSESSMENT AND PLAN:  1. CAD s/p NSTEMI with overlapping stents in the proximal to mid RCA on 08/24/13: Stable ischemic heart disease. She has residual moderate CAD but denies recurrent anginal symptoms. Continue ASA, lisinopril, Lipitor, and  Toprol-XL. I will aim to control blood pressure.  2. Essential hypertension: Remains elevated. Goal < 130/80 as per 2017 ACC/AHA guidelines. I will increase lisinopril to 40 mg daily and add amlodipine 5 mg daily.  3. Hyperlipidemia: Lipids reviewed above. Continue Lipitor 40 mg.     Disposition: Follow up 6 months.   [name], M.D., F.A.C.C.

## 2017-02-10 ENCOUNTER — Other Ambulatory Visit: Payer: Self-pay

## 2017-02-10 DIAGNOSIS — D649 Anemia, unspecified: Secondary | ICD-10-CM

## 2017-02-17 ENCOUNTER — Telehealth: Payer: Self-pay

## 2017-02-17 MED ORDER — CLOPIDOGREL BISULFATE 75 MG PO TABS: 75.0000 mg | ORAL_TABLET | Freq: Every day | ORAL | 5 refills | Status: DC

## 2017-02-17 NOTE — Telephone Encounter (Signed)
Request from [facility] in [place] for Plavix 75 mg one daily. Last filled #30 on 01/15/2017.  Requesting 90 day supply. Routing to Dr. Oneida Alar who originally wrote the prescription.

## 2017-02-17 NOTE — Telephone Encounter (Signed)
PT is aware.

## 2017-02-17 NOTE — Telephone Encounter (Signed)
PLEASE CALL PT.  Rx sent.  

## 2017-02-17 NOTE — Telephone Encounter (Signed)
REVIEWED.  

## 2017-04-02 ENCOUNTER — Other Ambulatory Visit: Payer: Self-pay | Admitting: *Deleted

## 2017-04-02 MED ORDER — LISINOPRIL 40 MG PO TABS: 40.0000 mg | ORAL_TABLET | Freq: Every day | ORAL | 3 refills | Status: DC

## 2017-04-02 MED ORDER — AMLODIPINE BESYLATE 5 MG PO TABS: 5.0000 mg | ORAL_TABLET | Freq: Every day | ORAL | 3 refills | Status: DC

## 2017-04-06 ENCOUNTER — Other Ambulatory Visit: Payer: Self-pay | Admitting: Family Medicine

## 2017-04-06 ENCOUNTER — Ambulatory Visit: Payer: Medicare Other | Admitting: Gastroenterology

## 2017-04-06 NOTE — Telephone Encounter (Signed)
Seen [phone]

## 2017-04-15 DIAGNOSIS — D649 Anemia, unspecified: Secondary | ICD-10-CM | POA: Diagnosis not present

## 2017-04-15 LAB — CBC WITH DIFFERENTIAL/PLATELET
BASOS ABS: 32 {cells}/uL (ref 0–200)
Basophils Relative: 0.6 %
EOS ABS: 58 {cells}/uL (ref 15–500)
EOS PCT: 1.1 %
HCT: 34.6 % — ABNORMAL LOW (ref 35.0–45.0)
Hemoglobin: 11.5 g/dL — ABNORMAL LOW (ref 11.7–15.5)
Lymphs Abs: 1680 cells/uL (ref 850–3900)
MCH: 31.1 pg (ref 27.0–33.0)
MCHC: 33.2 g/dL (ref 32.0–36.0)
MCV: 93.5 fL (ref 80.0–100.0)
MONOS PCT: 9.2 %
MPV: 10.4 fL (ref 7.5–12.5)
NEUTROS ABS: 3042 {cells}/uL (ref 1500–7800)
Neutrophils Relative %: 57.4 %
PLATELETS: 276 10*3/uL (ref 140–400)
RBC: 3.7 10*6/uL — ABNORMAL LOW (ref 3.80–5.10)
RDW: 12.3 % (ref 11.0–15.0)
TOTAL LYMPHOCYTE: 31.7 %
WBC mixed population: 488 cells/uL (ref 200–950)
WBC: 5.3 10*3/uL (ref 3.8–10.8)

## 2017-04-15 LAB — FERRITIN: FERRITIN: 102 ng/mL (ref 20–288)

## 2017-04-17 NOTE — Progress Notes (Signed)
Hgb stable, ferritin 102. Make sure taking oral iron BID. CBC, ferritin in 3 months.

## 2017-04-20 ENCOUNTER — Other Ambulatory Visit: Payer: Self-pay

## 2017-04-20 DIAGNOSIS — D509 Iron deficiency anemia, unspecified: Secondary | ICD-10-CM

## 2017-04-20 NOTE — Progress Notes (Signed)
Pt is aware, she will start taking the iron bid again. Lab orders on file.

## 2017-04-30 ENCOUNTER — Ambulatory Visit: Payer: Medicare Other | Admitting: Family Medicine

## 2017-05-15 ENCOUNTER — Other Ambulatory Visit: Payer: Self-pay | Admitting: Cardiovascular Disease

## 2017-05-28 ENCOUNTER — Encounter: Payer: Self-pay | Admitting: Gastroenterology

## 2017-05-28 ENCOUNTER — Ambulatory Visit: Payer: Medicare Other | Admitting: Gastroenterology

## 2017-05-28 VITALS — BP 145/72 | HR 74 | Temp 97.0°F | Ht 65.0 in | Wt 220.0 lb

## 2017-05-28 DIAGNOSIS — D508 Other iron deficiency anemias: Secondary | ICD-10-CM | POA: Diagnosis not present

## 2017-05-28 NOTE — Patient Instructions (Signed)
Continue iron once a day, and we will check labs in February 2019.   You have an appointment Monday Jan 14th at 2pm with Dr. Meda Coffee. If you need to call in future, try [phone]. There has been some issues with the phones, so that is the best number to call.   I will see you in April 2019!

## 2017-05-28 NOTE — Progress Notes (Signed)
Referring Provider: Raylene Everts, MD Primary Care Physician:  Raylene Everts, MD Primary GI: Dr. Oneida Alar   Chief Complaint  Patient presents with  . Blood In Stools    has not had an episode in a few months    HPI:   Shelly [name] is a 77 y.o. female presenting today with a history of GI bleeding, inpatient in Nov 2017. EGD and colonoscopy July 2017 by Dr. Michail Sermon with normal EGD and colonoscopy showing pancolonic diverticulosis. Presumed diverticular bleed at that time. Nov 2017 readmitted with suspected diverticular bleed in setting of oral diclofenac. Due to IDA, capsule study was completed. This showed enteritis due to aspirin use. Recommended discontinuing aspirin and changing to plavix. She is off of aspirin now.   Last seen May 2018. Had feraheme infusion July 2018 due to [place]. Hgb improved, ferritin improved. On oral iron daily now. Rechecking CBC, ferritin in Feb. No rectal bleeding since stopping aspirin. On Dexilant.   Past Medical History:  Diagnosis Date  . Allergy   . Anemia    GI bleed  . Anginal pain ([place])   . Anxiety   . Arthritis   . Blood transfusion without reported diagnosis   . Cataract   . Chronic kidney disease   . Coronary artery disease    SEVERE  . GERD (gastroesophageal reflux disease)   . GI bleed 12/2013  . Goiter   . Hyperlipidemia   . Hypertension   . Myocardial infarction ([place]) 08/2013  . Pancolonic diverticulosis 12/2013    Past Surgical History:  Procedure Laterality Date  . COLONOSCOPY N/A 01/13/2014   Dr. Hung:pandiverticulosis   . COLONOSCOPY N/A 12/14/2015   Dr. Michail Sermon: pancolonic diverticulosis, internal hemorrhoids   . CORNEAL TRANSPLANT    . CORONARY ANGIOPLASTY  08/2013  . ESOPHAGOGASTRODUODENOSCOPY N/A 12/14/2015   Dr. Michail Sermon: normal   . GIVENS CAPSULE STUDY N/A 08/13/2016   Dr. Oneida Alar: enteritis due to aspirin.   Marland Kitchen HEEL SPUR EXCISION    . LEFT HEART CATHETERIZATION WITH CORONARY ANGIOGRAM N/A 08/23/2013   Procedure: LEFT HEART CATHETERIZATION WITH CORONARY ANGIOGRAM;  Surgeon: Peter M Martinique, MD;  Location: [facility] CATH LAB;  Service: Cardiovascular;  Laterality: N/A;  . PERCUTANEOUS CORONARY STENT INTERVENTION (PCI-S) N/A 08/24/2013   Procedure: PERCUTANEOUS CORONARY STENT INTERVENTION (PCI-S);  Surgeon: Peter M Martinique, MD;  Location: [facility] CATH LAB;  Service: Cardiovascular;  Laterality: N/A;  . TONSILLECTOMY    . TUBAL LIGATION      Current Outpatient Medications  Medication Sig Dispense Refill  . amLODipine (NORVASC) 5 MG tablet Take 1 tablet (5 mg total) daily by mouth. 90 tablet 3  . atorvastatin (LIPITOR) 40 MG tablet Take 1 tablet (40 mg total) by mouth daily at 6 PM. 90 tablet 3  . b complex vitamins capsule Take 1 capsule by mouth daily.    . cetirizine (ZYRTEC) 10 MG tablet Take 10 mg by mouth as needed.     . clopidogrel (PLAVIX) 75 MG tablet Take 1 tablet (75 mg total) by mouth daily. 60 tablet 5  . clotrimazole-betamethasone (LOTRISONE) cream APPLY TO THE AFFECTED AREA TWICE DAILY. (Patient taking differently: APPLY TO THE AFFECTED AREA TWICE DAILY AS NEEDED) 45 g 1  . dexlansoprazole (DEXILANT) 60 MG capsule Take 1 capsule (60 mg total) by mouth daily. 90 capsule 3  . ferrous gluconate (FERGON) 324 MG tablet Take 1 tablet (324 mg total) by mouth daily with breakfast. 90 tablet 3  . lisinopril (PRINIVIL,ZESTRIL) 40  MG tablet Take 1 tablet (40 mg total) daily by mouth. 90 tablet 3  . metoprolol succinate (TOPROL-XL) 25 MG 24 hr tablet TAKE 1/2 TABLET BY MOUTH DAILY. 45 tablet 3  . neomycin-polymyxin-hydrocortisone (CORTISPORIN) 3.5-10000-1 OTIC suspension Place 3 drops into both ears 3 (three) times daily. (Patient taking differently: Place 3 drops into both ears as needed. ) 10 mL 6  . nitroGLYCERIN (NITROSTAT) 0.4 MG SL tablet Place 1 tablet (0.4 mg total) under the tongue every 5 (five) minutes as needed for chest pain. 25 tablet 3  . psyllium (METAMUCIL) 58.6 % powder Take 1 packet by  mouth as needed.     Marland Kitchen HYDROcodone-acetaminophen (NORCO/VICODIN) 5-325 MG tablet Take one tab po q 4-6 hrs prn pain 60 tablet 0   No current facility-administered medications for this visit.     Allergies as of 05/28/2017 - Review Complete 05/28/2017  Allergen Reaction Noted  . Nsaids Other (See Comments) 07/25/2016    Family History  Problem Relation Age of Onset  . Hypertension Brother   . Transient ischemic attack Brother   . Cancer Mother        unsure of type   . Asthma Sister   . Stroke Sister   . Stroke Sister   . Early death Brother   . Early death Brother   . Liver cancer Daughter   . Hernia Son        Umbilical  . Pancreatic disease Daughter        pancreatectomy  . Diverticulitis Daughter   . Anemia Daughter   . GER disease Son     Social History   Socioeconomic History  . Marital status: Widowed    Spouse name: None  . Number of children: 8  . Years of education: 37  . Highest education level: None  Social Needs  . Financial resource strain: None  . Food insecurity - worry: None  . Food insecurity - inability: None  . Transportation needs - medical: None  . Transportation needs - non-medical: None  Occupational History  . 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 Smoking status: Never Smoker  . Smokeless tobacco: Never Used  Substance and Sexual Activity  . Alcohol use: No    Alcohol/week: 0.0 oz  . Drug use: No  . Sexual activity: No  Other Topics Concern  . None  Social History Narrative   Works in a group home   Full time   Lives with daughter   Two grand daughters    One son home most of the time    Review of Systems: Gen: Denies fever, chills, anorexia. Denies fatigue, weakness, weight loss.  CV: Denies chest pain, palpitations, syncope, peripheral edema, and claudication. Resp: Denies dyspnea at rest, cough, wheezing, coughing up blood, and pleurisy. GI:see HPI  Derm: Denies rash, itching, dry  skin Psych: Denies depression, anxiety, memory loss, confusion. No homicidal or suicidal ideation.  Heme: Denies bruising, bleeding, and enlarged lymph nodes.  Physical Exam: BP (!) 145/72   Pulse 74   Temp (!) 97 F (36.1 C) (Oral)   Ht 5\' 5"  (1.651 m)   Wt 220 lb (99.8 kg)   BMI 36.61 kg/m  General:   Alert and oriented. No distress noted. Pleasant and cooperative.  Head:  Normocephalic and atraumatic. Eyes:  Conjuctiva clear without scleral icterus. Mouth:  Oral mucosa pink and moist.  Abdomen:  +BS, soft, non-tender and non-distended. No rebound or guarding.  No HSM or masses noted. Msk:  Symmetrical without gross deformities. Normal posture. Extremities:  Without edema. Neurologic:  Alert and  oriented x4 Psych:  Alert and cooperative. Normal mood and affect.  Lab Results  Component Value Date   WBC 5.3 04/15/2017   HGB 11.5 (L) 04/15/2017   HCT 34.6 (L) 04/15/2017   MCV 93.5 04/15/2017   PLT 276 04/15/2017   Lab Results  Component Value Date   IRON 46 01/09/2017   TIBC 196 (L) 01/09/2017   FERRITIN 102 04/15/2017

## 2017-06-01 ENCOUNTER — Encounter: Payer: Self-pay | Admitting: Family Medicine

## 2017-06-01 ENCOUNTER — Other Ambulatory Visit: Payer: Self-pay

## 2017-06-01 ENCOUNTER — Ambulatory Visit (INDEPENDENT_AMBULATORY_CARE_PROVIDER_SITE_OTHER): Payer: Medicare Other | Admitting: Family Medicine

## 2017-06-01 VITALS — BP 138/74 | HR 72 | Temp 98.7°F | Resp 18 | Ht 65.0 in | Wt 228.1 lb

## 2017-06-01 DIAGNOSIS — I2510 Atherosclerotic heart disease of native coronary artery without angina pectoris: Secondary | ICD-10-CM | POA: Diagnosis not present

## 2017-06-01 DIAGNOSIS — M1712 Unilateral primary osteoarthritis, left knee: Secondary | ICD-10-CM

## 2017-06-01 DIAGNOSIS — I10 Essential (primary) hypertension: Secondary | ICD-10-CM

## 2017-06-01 DIAGNOSIS — Z23 Encounter for immunization: Secondary | ICD-10-CM

## 2017-06-01 MED ORDER — HYDROCODONE-ACETAMINOPHEN 5-325 MG PO TABS: ORAL_TABLET | ORAL | 0 refills | Status: DC

## 2017-06-01 NOTE — Patient Instructions (Signed)
We discussed flu shot.  He declined because of a reaction I am refilling your pain medicine Your blood pressure is good today.  No change in medicine Try to limit the salt in your diet See me in 3 months.  Need blood work next time.  Call sooner if you have any problems.

## 2017-06-01 NOTE — Assessment & Plan Note (Signed)
77 year old very pleasant female with EGD/colonoscopy on file from 2017, capsule study with enteritis in setting of aspirin. Remains on Plavix only. Feraheme infusion last year with improvement in labs. On iron once daily. Will recheck CBC and iron studies in February, with return in April 2019.

## 2017-06-01 NOTE — Progress Notes (Signed)
Chief Complaint  Patient presents with  . Follow-up  Patient is here for follow-up.  She continues to limit her activity because of severe left pain in knee.  This is chronic.  She has known end-stage osteoarthritis.  She knows she needs a knee replacement.  She does not want to do this for 2 reasons.  First, her daughter had a bad result from a knee surgery.  Second, she states she cannot afford it.  She requests a refill of hydrocodone.  She got 60 pills for 6 months ago.  She takes them infrequently.  She is advised that they can cause constipation and drowsiness.  She has not had any falls or problems with the medication.  Most the time she takes Tylenol. She is obese.  She has difficulty losing weight because of her inability to exercise.  Discussed portions, fatty foods, carbohydrates, snacking. Her blood pressures well controlled.  She is compliant with her medication. She refuses flu shots.  I discussed with her the reasons I believe she needs to have a flu shot.  She states she had a flu shot several years ago and was sick for 2 weeks.  I tried to explain to her that it was not likely due to the flu shot, but with this bad experience she is not willing to be flexible. She has trace edema in her ankles.  She states is chronic.  She admits to increased salt intake.  Patient Active Problem List   Diagnosis Date Noted  . Iron deficiency anemia 08/27/2016  . Morbid obesity ([place]) 08/27/2016  . CKD (chronic kidney disease), stage III ([place]) 04/04/2016  . Osteoarthritis left knee 04/04/2016  . Third degree heart block ([place]) 12/10/2015  . Lower GI bleeding 12/09/2015  . Pancolonic diverticulosis 12/09/2015  . CAD (coronary artery disease) 01/11/2014  . NSTEMI (non-ST elevated myocardial infarction) ([place]) 08/23/2013  . IDA (iron deficiency anemia) 08/23/2013  . Chest pain 08/22/2013  . Essential hypertension 08/22/2013  . GERD (gastroesophageal reflux disease) 08/22/2013  . Anxiety  08/22/2013    Outpatient Encounter Medications as of 06/01/2017  Medication Sig  . amLODipine (NORVASC) 5 MG tablet Take 1 tablet (5 mg total) daily by mouth.  Marland Kitchen atorvastatin (LIPITOR) 40 MG tablet Take 1 tablet (40 mg total) by mouth daily at 6 PM.  . b complex vitamins capsule Take 1 capsule by mouth daily.  . cetirizine (ZYRTEC) 10 MG tablet Take 10 mg by mouth as needed.   . clopidogrel (PLAVIX) 75 MG tablet Take 1 tablet (75 mg total) by mouth daily.  . clotrimazole-betamethasone (LOTRISONE) cream APPLY TO THE AFFECTED AREA TWICE DAILY. (Patient taking differently: APPLY TO THE AFFECTED AREA TWICE DAILY AS NEEDED)  . dexlansoprazole (DEXILANT) 60 MG capsule Take 1 capsule (60 mg total) by mouth daily.  . ferrous gluconate (FERGON) 324 MG tablet Take 1 tablet (324 mg total) by mouth daily with breakfast.  . HYDROcodone-acetaminophen (NORCO/VICODIN) 5-325 MG tablet Take one tab po q 4-6 hrs prn pain  . lisinopril (PRINIVIL,ZESTRIL) 40 MG tablet Take 1 tablet (40 mg total) daily by mouth.  . metoprolol succinate (TOPROL-XL) 25 MG 24 hr tablet TAKE 1/2 TABLET BY MOUTH DAILY.  Marland Kitchen neomycin-polymyxin-hydrocortisone (CORTISPORIN) 3.5-10000-1 OTIC suspension Place 3 drops into both ears 3 (three) times daily. (Patient taking differently: Place 3 drops into both ears as needed. )  . nitroGLYCERIN (NITROSTAT) 0.4 MG SL tablet Place 1 tablet (0.4 mg total) under the tongue every 5 (five) minutes as  needed for chest pain.  Marland Kitchen psyllium (METAMUCIL) 58.6 % powder Take 1 packet by mouth as needed.   . [DISCONTINUED] HYDROcodone-acetaminophen (NORCO/VICODIN) 5-325 MG tablet Take one tab po q 4-6 hrs prn pain   No facility-administered encounter medications on file as of 06/01/2017.     Allergies  Allergen Reactions  . Nsaids Other (See Comments)    AVOID all NSAID due to history of GI bleeding    Review of Systems  Constitutional: Negative for activity change, appetite change and unexpected weight  change.  HENT: Negative for congestion, dental problem, ear discharge, ear pain, postnasal drip and rhinorrhea.   Eyes: Negative for redness and visual disturbance.  Respiratory: Negative for cough and shortness of breath.   Cardiovascular: Negative for chest pain, palpitations and leg swelling.  Gastrointestinal: Negative for abdominal pain, constipation and diarrhea.  Genitourinary: Negative for difficulty urinating and frequency.  Musculoskeletal: Positive for arthralgias, gait problem and joint swelling. Negative for back pain.  Neurological: Negative for dizziness and headaches.  Psychiatric/Behavioral: Negative for dysphoric mood and sleep disturbance. The patient is not nervous/anxious.     BP 138/74   Pulse 72   Temp 98.7 F (37.1 C) (Temporal)   Resp 18   Ht 5\' 5"  (1.651 m)   Wt 228 lb 1.9 oz (103.5 kg)   SpO2 98%   BMI 37.96 kg/m   Physical Exam  Constitutional: She is oriented to person, place, and time. She appears well-developed.  Pleasant. Obese.  Antalgic gait.  HENT:  Head: Normocephalic and atraumatic.  Right Ear: External ear normal.  Left Ear: External ear normal.  Mouth/Throat: Oropharynx is clear and moist.  Canals clear, TMs normal  Eyes: Conjunctivae are normal. Pupils are equal, round, and reactive to light.  Neck: Normal range of motion. Neck supple.  Cardiovascular: Normal rate and regular rhythm.  Murmur heard. Pulmonary/Chest: Effort normal and breath sounds normal. No respiratory distress. She has no wheezes.  Musculoskeletal: Normal range of motion.  Left knee with crepitus and warmth.  Trace pitting edema  Lymphadenopathy:    She has no cervical adenopathy.  Neurological: She is alert and oriented to person, place, and time.  Psychiatric: She has a normal mood and affect. Her behavior is normal. Thought content normal.    ASSESSMENT/PLAN:  1. Need for influenza vaccination Refuses - Flu Vaccine QUAD 36+ mos IM  2. Essential  hypertension Controlled  3. Primary osteoarthritis of left knee Painful bone-on-bone  4. Morbid obesity (HCC) Stable  5. Coronary artery disease involving native coronary artery of native heart without angina pectoris No chest pain or shortness of breath.   Patient Instructions  We discussed flu shot.  He declined because of a reaction I am refilling your pain medicine Your blood pressure is good today.  No change in medicine Try to limit the salt in your diet See me in 3 months.  Need blood work next time.  Call sooner if you have any problems.   Raylene Everts, MD

## 2017-06-02 NOTE — Progress Notes (Signed)
cc'ed to pcp °

## 2017-06-08 ENCOUNTER — Telehealth: Payer: Self-pay

## 2017-06-08 NOTE — Telephone Encounter (Signed)
Pt called and is almost out of [place]. Said she has paperwork completed for the Patient Assistance for the [place] and she will bring that in and pick up samples of the [place].  #15 tablets at front for pick up.

## 2017-06-09 NOTE — Telephone Encounter (Signed)
REVIEWED-NO ADDITIONAL RECOMMENDATIONS. 

## 2017-06-10 NOTE — Telephone Encounter (Signed)
Pt returned paperwork for the patient assistance for Dexilant, but forgot to bring the statement of her income, only had amount written.  She is aware to bring that statement by so we can complete the request.

## 2017-06-15 NOTE — Telephone Encounter (Signed)
Paperwork completed by pt and placed on desk for provider to sign.

## 2017-06-15 NOTE — Telephone Encounter (Signed)
Paperwork has been faxed to [facility] pt assistance.

## 2017-06-27 ENCOUNTER — Other Ambulatory Visit: Payer: Self-pay | Admitting: Family Medicine

## 2017-06-29 NOTE — Telephone Encounter (Signed)
Seen 1 14 19

## 2017-07-13 ENCOUNTER — Encounter: Payer: Self-pay | Admitting: Family Medicine

## 2017-07-13 ENCOUNTER — Ambulatory Visit (INDEPENDENT_AMBULATORY_CARE_PROVIDER_SITE_OTHER): Payer: Medicare Other | Admitting: Family Medicine

## 2017-07-13 ENCOUNTER — Other Ambulatory Visit: Payer: Self-pay

## 2017-07-13 VITALS — BP 150/78 | HR 72 | Temp 98.1°F | Resp 18 | Ht 65.0 in | Wt 231.0 lb

## 2017-07-13 DIAGNOSIS — H6062 Unspecified chronic otitis externa, left ear: Secondary | ICD-10-CM | POA: Diagnosis not present

## 2017-07-13 DIAGNOSIS — G5791 Unspecified mononeuropathy of right lower limb: Secondary | ICD-10-CM | POA: Diagnosis not present

## 2017-07-13 DIAGNOSIS — M792 Neuralgia and neuritis, unspecified: Secondary | ICD-10-CM | POA: Insufficient documentation

## 2017-07-13 HISTORY — DX: Neuralgia and neuritis, unspecified: M79.2

## 2017-07-13 MED ORDER — METHYLPREDNISOLONE 4 MG PO TBPK: ORAL_TABLET | ORAL | 0 refills | Status: DC

## 2017-07-13 MED ORDER — CETIRIZINE HCL 10 MG PO TABS: 10.0000 mg | ORAL_TABLET | ORAL | 0 refills | Status: DC | PRN

## 2017-07-13 NOTE — Patient Instructions (Addendum)
Take the prednisone pak as instructed Call if not better in a week I will let you know about the plavix You should hear from [place] in a day or two  Need follow up every 3-4 months  You will need to see Dr Mannie Stabile next time

## 2017-07-13 NOTE — Progress Notes (Signed)
Chief Complaint  Patient presents with  . Leg Pain    right thigh  . Ear Pain    left  Patient is here for 2 complaints.  She has pain in her right thigh.  Is been present for little over a month.  She points to distribution on her lateral thigh from the knee joint to mid thigh region.  It is a patch about the size of her palm.  She states it feels like it is burning.  Tickling and prickling of the skin.  Slightly diminished sensation.  No trauma or injury.  No changes in activity.  No skin rash.  No pain in the back or SI region.  No pain that radiates to the foot.  She has not tried any medicine for pain. She also has recurring pain in her left ear.  She has eardrops.  She wonders whether she should still use them.  No pain with chewing.  No cold symptoms.  No change in hearing. She has a pill bottle that is empty, for Plavix.  It is prescribed for her by Trinda Pascal in gastroenterology.  She wonders whether she still needs to take it.  I told her I would contact both Dr. Oneida Alar and her cardiologist Dr. Bobby Rumpf and inquire.  Patient Active Problem List   Diagnosis Date Noted  . Neuralgia of right lower extremity 07/13/2017  . Iron deficiency anemia 08/27/2016  . Morbid obesity ([place]) 08/27/2016  . CKD (chronic kidney disease), stage III ([place]) 04/04/2016  . Osteoarthritis left knee 04/04/2016  . Third degree heart block ([place]) 12/10/2015  . Lower GI bleeding 12/09/2015  . Pancolonic diverticulosis 12/09/2015  . CAD (coronary artery disease) 01/11/2014  . NSTEMI (non-ST elevated myocardial infarction) ([place]) 08/23/2013  . IDA (iron deficiency anemia) 08/23/2013  . Chest pain 08/22/2013  . Essential hypertension 08/22/2013  . GERD (gastroesophageal reflux disease) 08/22/2013  . Anxiety 08/22/2013    Outpatient Encounter Medications as of 07/13/2017  Medication Sig  . atorvastatin (LIPITOR) 40 MG tablet TAKE ONE TABLET BY MOUTH DAILY AT 6 PM.  . b complex vitamins capsule Take 1  capsule by mouth daily.  . cetirizine (ZYRTEC) 10 MG tablet Take 1 tablet (10 mg total) by mouth as needed.  . clopidogrel (PLAVIX) 75 MG tablet Take 1 tablet (75 mg total) by mouth daily.  . clotrimazole-betamethasone (LOTRISONE) cream APPLY TO THE AFFECTED AREA TWICE DAILY. (Patient taking differently: APPLY TO THE AFFECTED AREA TWICE DAILY AS NEEDED)  . dexlansoprazole (DEXILANT) 60 MG capsule Take 1 capsule (60 mg total) by mouth daily.  . ferrous gluconate (FERGON) 324 MG tablet Take 1 tablet (324 mg total) by mouth daily with breakfast.  . HYDROcodone-acetaminophen (NORCO/VICODIN) 5-325 MG tablet Take one tab po q 4-6 hrs prn pain  . lisinopril (PRINIVIL,ZESTRIL) 40 MG tablet Take 1 tablet (40 mg total) daily by mouth.  . metoprolol succinate (TOPROL-XL) 25 MG 24 hr tablet TAKE 1/2 TABLET BY MOUTH DAILY.  Marland Kitchen neomycin-polymyxin-hydrocortisone (CORTISPORIN) 3.5-10000-1 OTIC suspension Place 3 drops into both ears 3 (three) times daily. (Patient taking differently: Place 3 drops into both ears as needed. )  . nitroGLYCERIN (NITROSTAT) 0.4 MG SL tablet Place 1 tablet (0.4 mg total) under the tongue every 5 (five) minutes as needed for chest pain.  Marland Kitchen psyllium (METAMUCIL) 58.6 % powder Take 1 packet by mouth as needed.   . [DISCONTINUED] cetirizine (ZYRTEC) 10 MG tablet Take 10 mg by mouth as needed.   Marland Kitchen amLODipine (  NORVASC) 5 MG tablet Take 1 tablet (5 mg total) daily by mouth.  . methylPREDNISolone (MEDROL DOSEPAK) 4 MG TBPK tablet tad   No facility-administered encounter medications on file as of 07/13/2017.     Allergies  Allergen Reactions  . Nsaids Other (See Comments)    AVOID all NSAID due to history of GI bleeding    Review of Systems  Constitutional: Negative for activity change, appetite change and unexpected weight change.  HENT: Positive for ear pain. Negative for congestion, dental problem, ear discharge, postnasal drip and rhinorrhea.   Eyes: Negative for redness and visual  disturbance.  Respiratory: Negative for cough and shortness of breath.   Cardiovascular: Negative for chest pain, palpitations and leg swelling.  Gastrointestinal: Negative for abdominal pain, constipation and diarrhea.  Genitourinary: Negative for difficulty urinating and frequency.  Musculoskeletal: Positive for arthralgias, gait problem and joint swelling. Negative for back pain.       Chronic knee pain  Neurological: Positive for numbness. Negative for dizziness and headaches.  Psychiatric/Behavioral: Negative for dysphoric mood and sleep disturbance. The patient is not nervous/anxious.     BP (!) 150/78 (BP Location: Left Arm, Patient Position: Sitting, Cuff Size: Large)   Pulse 72   Temp 98.1 F (36.7 C) (Temporal)   Resp 18   Ht 5\' 5"  (1.651 m)   Wt 231 lb (104.8 kg)   SpO2 99%   BMI 38.44 kg/m   Physical Exam  Constitutional: She is oriented to person, place, and time. She appears well-developed.  Pleasant. Obese.  Antalgic gait.  HENT:  Head: Normocephalic and atraumatic.  Right Ear: External ear normal.  Left Ear: External ear normal.  Mouth/Throat: Oropharynx is clear and moist.  Right canal clear, left canal with minimal erythema at the opening.  No discharge, TMs normal  Eyes: Conjunctivae are normal. Pupils are equal, round, and reactive to light.  Neck: Normal range of motion. Neck supple.  Cardiovascular: Normal rate and regular rhythm.  Murmur heard. Pulmonary/Chest: Effort normal and breath sounds normal. No respiratory distress. She has no wheezes.  Musculoskeletal: Normal range of motion.       Legs: Left knee with crepitus and warmth.  Trace pitting edema.  The area with neuralgia has normal skin, no tenderness, slight diminished sensation.  Normal range of motion at hip knee ankle foot.  No tenderness low back or SI regions.  Lymphadenopathy:    She has no cervical adenopathy.  Neurological: She is alert and oriented to person, place, and time.    Psychiatric: She has a normal mood and affect. Her behavior is normal. Thought content normal.    ASSESSMENT/PLAN:  1. Neuralgia of right lower extremity Clear etiology.  Will try prednisone.  Can try gabapentin if this fails to improve.  2. Chronic otitis externa of left ear, unspecified type Slight inflammation in her ear canal.  Continue eardrops   Patient Instructions  Take the prednisone pak as instructed Call if not better in a week I will let you know about the plavix You should hear from [place] in a day or two  Need follow up every 3-4 months  You will need to see Dr Mannie Stabile next time   Raylene Everts, MD

## 2017-07-15 ENCOUNTER — Telehealth: Payer: Self-pay

## 2017-07-15 NOTE — Telephone Encounter (Signed)
-----   Message from Raylene Everts, MD sent at 07/14/2017  4:13 PM EST ----- Tell Shelly [name] to stay on the plavix until she sees Dr Bobby Rumpf next month. ----- Message ----- From: Herminio Commons, MD Sent: 07/13/2017   1:08 PM To: Raylene Everts, MD  In that case, I would start Xarelto 2.5 mg bid given her CAD, with close monitoring of Hgb.  ----- Message ----- From: Raylene Everts, MD Sent: 07/13/2017  12:49 PM To: Herminio Commons, MD  She was taken off of aspirin because of her GI bleeding.  Plavix as prescribed by Dr. Oneida Alar in gastroenterology. ----- Message ----- From: Herminio Commons, MD Sent: 07/13/2017  12:42 PM To: Raylene Everts, MD  So long as she is on ASA 81 mg, yes.  ----- Message ----- From: Raylene Everts, MD Sent: 07/13/2017  12:40 PM To: Herminio Commons, MD  Patient questions whether she should remain on Plavix.  Is it safe to discontinue?

## 2017-07-15 NOTE — Telephone Encounter (Signed)
Called Shelly [name], aware.

## 2017-07-27 ENCOUNTER — Encounter: Payer: Self-pay | Admitting: Family Medicine

## 2017-08-04 ENCOUNTER — Encounter: Payer: Self-pay | Admitting: Cardiovascular Disease

## 2017-08-04 ENCOUNTER — Encounter: Payer: Self-pay | Admitting: *Deleted

## 2017-08-04 ENCOUNTER — Other Ambulatory Visit: Payer: Self-pay

## 2017-08-04 ENCOUNTER — Other Ambulatory Visit: Payer: Self-pay | Admitting: Family Medicine

## 2017-08-04 ENCOUNTER — Ambulatory Visit (INDEPENDENT_AMBULATORY_CARE_PROVIDER_SITE_OTHER): Payer: Medicare Other | Admitting: Cardiovascular Disease

## 2017-08-04 VITALS — BP 160/81 | HR 75 | Ht 62.0 in | Wt 234.0 lb

## 2017-08-04 DIAGNOSIS — I25118 Atherosclerotic heart disease of native coronary artery with other forms of angina pectoris: Secondary | ICD-10-CM

## 2017-08-04 DIAGNOSIS — Z955 Presence of coronary angioplasty implant and graft: Secondary | ICD-10-CM

## 2017-08-04 DIAGNOSIS — I10 Essential (primary) hypertension: Secondary | ICD-10-CM

## 2017-08-04 DIAGNOSIS — I252 Old myocardial infarction: Secondary | ICD-10-CM

## 2017-08-04 DIAGNOSIS — E785 Hyperlipidemia, unspecified: Secondary | ICD-10-CM | POA: Diagnosis not present

## 2017-08-04 DIAGNOSIS — Z713 Dietary counseling and surveillance: Secondary | ICD-10-CM | POA: Diagnosis not present

## 2017-08-04 DIAGNOSIS — E669 Obesity, unspecified: Secondary | ICD-10-CM

## 2017-08-04 DIAGNOSIS — Z7182 Exercise counseling: Secondary | ICD-10-CM

## 2017-08-04 MED ORDER — AMLODIPINE BESYLATE 10 MG PO TABS: 10.0000 mg | ORAL_TABLET | Freq: Every day | ORAL | 6 refills | Status: DC

## 2017-08-04 NOTE — Progress Notes (Signed)
SUBJECTIVE: The patient presents for follow-up of coronary artery disease and hypertension.  Lipids 11/20/16: Total cholesterol 140, triglycerides 54, LDL 76, HDL 53.  She denies chest pain, palpitations, and shortness of breath.  We spoke at length about dietary habits.  She tries to avoid adding salt to foods and if she eats canned foods, she drains them and washes them.  She likes eating pears and canned peaches.     Review of Systems: As per "subjective", otherwise negative.  Allergies  Allergen Reactions  . Nsaids Other (See Comments)    AVOID all NSAID due to history of GI bleeding    Current Outpatient Medications  Medication Sig Dispense Refill  . amLODipine (NORVASC) 5 MG tablet Take 5 mg by mouth daily.    Marland Kitchen atorvastatin (LIPITOR) 40 MG tablet TAKE ONE TABLET BY MOUTH DAILY AT 6 PM. 90 tablet 0  . b complex vitamins capsule Take 1 capsule by mouth daily.    . cetirizine (ZYRTEC) 10 MG tablet Take 1 tablet (10 mg total) by mouth as needed. 90 tablet 0  . clopidogrel (PLAVIX) 75 MG tablet Take 1 tablet (75 mg total) by mouth daily. 60 tablet 5  . clotrimazole-betamethasone (LOTRISONE) cream APPLY TO THE AFFECTED AREA TWICE DAILY. (Patient taking differently: APPLY TO THE AFFECTED AREA TWICE DAILY AS NEEDED) 45 g 1  . dexlansoprazole (DEXILANT) 60 MG capsule Take 1 capsule (60 mg total) by mouth daily. 90 capsule 3  . ferrous gluconate (FERGON) 324 MG tablet Take 1 tablet (324 mg total) by mouth daily with breakfast. 90 tablet 3  . HYDROcodone-acetaminophen (NORCO/VICODIN) 5-325 MG tablet Take one tab po q 4-6 hrs prn pain 60 tablet 0  . lisinopril (PRINIVIL,ZESTRIL) 40 MG tablet Take 1 tablet (40 mg total) daily by mouth. 90 tablet 3  . metoprolol succinate (TOPROL-XL) 25 MG 24 hr tablet TAKE 1/2 TABLET BY MOUTH DAILY. 45 tablet 3  . neomycin-polymyxin-hydrocortisone (CORTISPORIN) 3.5-10000-1 OTIC suspension Place 3 drops into both ears 3 (three) times daily.  (Patient taking differently: Place 3 drops into both ears as needed. ) 10 mL 6  . nitroGLYCERIN (NITROSTAT) 0.4 MG SL tablet Place 1 tablet (0.4 mg total) under the tongue every 5 (five) minutes as needed for chest pain. 25 tablet 3  . psyllium (METAMUCIL) 58.6 % powder Take 1 packet by mouth as needed.      No current facility-administered medications for this visit.     Past Medical History:  Diagnosis Date  . Allergy   . Anemia    GI bleed  . Anginal pain ([place])   . Anxiety   . Arthritis   . Blood transfusion without reported diagnosis   . Cataract   . Chronic kidney disease   . Coronary artery disease    SEVERE  . GERD (gastroesophageal reflux disease)   . GI bleed 12/2013  . Goiter   . Hyperlipidemia   . Hypertension   . Myocardial infarction ([place]) 08/2013  . Neuralgia of right lower extremity 07/13/2017  . Pancolonic diverticulosis 12/2013    Past Surgical History:  Procedure Laterality Date  . COLONOSCOPY N/A 01/13/2014   Dr. Hung:pandiverticulosis   . COLONOSCOPY N/A 12/14/2015   Dr. Michail Sermon: pancolonic diverticulosis, internal hemorrhoids   . CORNEAL TRANSPLANT    . CORONARY ANGIOPLASTY  08/2013  . ESOPHAGOGASTRODUODENOSCOPY N/A 12/14/2015   Dr. Michail Sermon: normal   . GIVENS CAPSULE STUDY N/A 08/13/2016   Dr. Oneida Alar: enteritis due to aspirin.   Marland Kitchen  HEEL SPUR EXCISION    . LEFT HEART CATHETERIZATION WITH CORONARY ANGIOGRAM N/A 08/23/2013   Procedure: LEFT HEART CATHETERIZATION WITH CORONARY ANGIOGRAM;  Surgeon: Peter M Martinique, MD;  Location: [facility] CATH LAB;  Service: Cardiovascular;  Laterality: N/A;  . PERCUTANEOUS CORONARY STENT INTERVENTION (PCI-S) N/A 08/24/2013   Procedure: PERCUTANEOUS CORONARY STENT INTERVENTION (PCI-S);  Surgeon: Peter M Martinique, MD;  Location: [facility] CATH LAB;  Service: Cardiovascular;  Laterality: N/A;  . TONSILLECTOMY    . TUBAL LIGATION      Social History   Socioeconomic History  . Marital status: Widowed    Spouse name: Not on file  . Number of  children: 8  . Years of education: 46  . Highest education level: Not on file  Social Needs  . Financial resource strain: Not on file  . Food insecurity - worry: Not on file  . Food insecurity - inability: Not on file  . Transportation needs - medical: Not on file  . Transportation needs - non-medical: Not on file  Occupational History  . 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 Smoking status: Never Smoker  . Smokeless tobacco: Never Used  Substance and Sexual Activity  . Alcohol use: No    Alcohol/week: 0.0 oz  . Drug use: No  . Sexual activity: No  Other Topics Concern  . 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 Neck: No JVD, no thyromegaly. Lungs: Clear to auscultation bilaterally with normal respiratory effort. CV: Regular rate and rhythm, normal S1/S2, no S3/S4, no murmur.  Trace bilateral pretibial and peri-ankle edema.  No carotid bruit.   Abdomen: Soft, nontender, no distention.  Neurologic: Alert and oriented.  Psych: Normal affect. Skin: Normal. Musculoskeletal: No gross deformities.    ECG: Most recent ECG reviewed.   Labs: Lab Results  Component Value Date/Time   K 4.4 11/20/2016 08:54 AM   BUN 22 11/20/2016 08:54 AM   CREATININE 1.23 (H) 11/20/2016 08:54 AM   ALT 8 11/20/2016 08:54 AM   TSH 0.324 (L) 12/09/2015 08:33 AM   TSH 0.292 (L) 10/19/2013 10:24 AM   HGB 11.5 (L) 04/15/2017 10:08 AM     Lipids: Lab Results  Component Value Date/Time   LDLCALC 76 11/20/2016 08:54 AM   CHOL 140 11/20/2016 08:54 AM     TRIG 54 11/20/2016 08:54 AM   HDL 53 11/20/2016 08:54 AM       ASSESSMENT AND PLAN: 1. CAD s/p NSTEMI with overlapping stents in the proximal to mid RCA on 08/24/13: Stable ischemic heart disease. She has residual moderate CAD but denies recurrent anginal symptoms. Continue ASA, lisinopril, Lipitor, and Toprol-XL. I will aim to control blood pressure.  2. Essential hypertension: Remains elevated. Goal < 130/80 as per 2017 ACC/AHA guidelines. I will increase amlodipine to 10 mg daily.  We also spoke at length about dietary and exercise modification with weight  loss emphasized.  3. Hyperlipidemia: Lipids reviewed above. Continue Lipitor 40 mg.  4.  Obesity: I spoke to her at length about the importance of increasing physical activity such as water aerobics which will also help with left knee pain.  I spoke to her about reducing caloric intake particularly in the form of canned fruits which have high amounts of sugar.  I told her to try to aim for a goal of weight loss of 1 pound per week.     Disposition: Follow up 6 months   [name], M.D., F.A.C.C.

## 2017-08-04 NOTE — Patient Instructions (Signed)
Medication Instructions:   Increase Amlodipine to 10mg daily.  Continue all other medications.    Labwork: none  Testing/Procedures: none  Follow-Up: Your physician wants you to follow up in: 6 months.  You will receive a reminder letter in the mail one-two months in advance.  If you don't receive a letter, please call our office to schedule the follow up appointment   Any Other Special Instructions Will Be Listed Below (If Applicable).  If you need a refill on your cardiac medications before your next appointment, please call your pharmacy.  

## 2017-08-26 ENCOUNTER — Encounter: Payer: Self-pay | Admitting: Gastroenterology

## 2017-08-26 ENCOUNTER — Ambulatory Visit: Payer: Medicare Other | Admitting: Gastroenterology

## 2017-08-26 VITALS — BP 152/64 | HR 75 | Temp 96.7°F | Ht 64.0 in | Wt 233.0 lb

## 2017-08-26 DIAGNOSIS — K219 Gastro-esophageal reflux disease without esophagitis: Secondary | ICD-10-CM | POA: Diagnosis not present

## 2017-08-26 DIAGNOSIS — D508 Other iron deficiency anemias: Secondary | ICD-10-CM | POA: Diagnosis not present

## 2017-08-26 LAB — CBC WITH DIFFERENTIAL/PLATELET
BASOS ABS: 29 {cells}/uL (ref 0–200)
Basophils Relative: 0.5 %
EOS ABS: 40 {cells}/uL (ref 15–500)
Eosinophils Relative: 0.7 %
HEMATOCRIT: 36.1 % (ref 35.0–45.0)
Hemoglobin: 12 g/dL (ref 11.7–15.5)
LYMPHS ABS: 1813 {cells}/uL (ref 850–3900)
MCH: 30.9 pg (ref 27.0–33.0)
MCHC: 33.2 g/dL (ref 32.0–36.0)
MCV: 93 fL (ref 80.0–100.0)
MPV: 10.4 fL (ref 7.5–12.5)
Monocytes Relative: 8.7 %
NEUTROS PCT: 58.3 %
Neutro Abs: 3323 cells/uL (ref 1500–7800)
PLATELETS: 273 10*3/uL (ref 140–400)
RBC: 3.88 10*6/uL (ref 3.80–5.10)
RDW: 12.7 % (ref 11.0–15.0)
TOTAL LYMPHOCYTE: 31.8 %
WBC: 5.7 10*3/uL (ref 3.8–10.8)
WBCMIX: 496 {cells}/uL (ref 200–950)

## 2017-08-26 LAB — IRON,TIBC AND FERRITIN PANEL
%SAT: 24 % (ref 11–50)
Ferritin: 112 ng/mL (ref 20–288)
Iron: 53 ug/dL (ref 45–160)
TIBC: 219 mcg/dL (calc) — ABNORMAL LOW (ref 250–450)

## 2017-08-26 NOTE — Patient Instructions (Addendum)
Please have blood work done. We need to make sure your iron levels are doing well.  We will see you in 3 months!  It was a pleasure to see you today. I strive to create trusting relationships with patients to provide genuine, compassionate, and quality care. I value your feedback. If you receive a survey regarding your visit,  I greatly appreciate you taking time to fill this out.   Annitta Needs, PhD, ANP-BC [facility]

## 2017-08-26 NOTE — Assessment & Plan Note (Signed)
77 year old female with history of IDA historically secondary to aspirin use, with EGD/colonoscopy, capsule on file as noted in HPI. One iron infusion last year and would like to avoid this in future if possible. Most recent CBC, iron studies in Nov 2018 with improvement in IDA as she is no longer on aspirin. No overt GI bleeding. Remains on oral iron once daily. Recheck CBC, iron studies today. Return in 3 months, as she desires close follow-up.

## 2017-08-26 NOTE — Progress Notes (Signed)
cc'ed to pcp °

## 2017-08-26 NOTE — Assessment & Plan Note (Signed)
Doing well on Dexilant once daily. Continue. Rare nausea reported that is self-limiting and likely related to dietary intake. No alarm symptoms. Call if worsening.

## 2017-08-26 NOTE — Progress Notes (Signed)
Referring Primary Care Physician:  Raylene Everts, MD (will be Dr. Mannie Stabile in near future) Primary GI: Dr. Oneida Alar   Chief Complaint  Patient presents with  . Anemia    f/u  . Nausea    occ    HPI:   Shelly [name] is a 77 y.o. female presenting today with a history of GI bleeding, inpatient in Nov 2017. EGD and colonoscopy July 2017 by Dr. Michail Sermon with normal EGD and colonoscopy showing pancolonic diverticulosis. Presumed diverticular bleed at that time. Nov 2017 readmitted with suspected diverticular bleed in setting of oral diclofenac.Due to IDA, capsule study was completed. This showed enteritis due to aspirin use. Recommended discontinuing aspirin and changing to plavix.She is off of aspirin now.  Last seen Jan 2019. Iron infusion July 2018 due to IDA. She is actually due for labs now. No hematochezia/melena since getting off aspirin. Remains on Plavix. Rare nausea. Will take peppermint and goes away. Had eaten vinegar on her chicken and then had nausea. Thinks she needs to get back on her fiber like she was.   Has chronic issues with left knee pain.   Past Medical History:  Diagnosis Date  . Allergy   . Anemia    GI bleed  . Anginal pain ([place])   . Anxiety   . Arthritis   . Blood transfusion without reported diagnosis   . Cataract   . Chronic kidney disease   . Coronary artery disease    SEVERE  . GERD (gastroesophageal reflux disease)   . GI bleed 12/2013  . Goiter   . Hyperlipidemia   . Hypertension   . Myocardial infarction ([place]) 08/2013  . Neuralgia of right lower extremity 07/13/2017  . Pancolonic diverticulosis 12/2013    Past Surgical History:  Procedure Laterality Date  . COLONOSCOPY N/A 01/13/2014   Dr. Hung:pandiverticulosis   . COLONOSCOPY N/A 12/14/2015   Dr. Michail Sermon: pancolonic diverticulosis, internal hemorrhoids   . CORNEAL TRANSPLANT    . CORONARY ANGIOPLASTY  08/2013  . ESOPHAGOGASTRODUODENOSCOPY N/A 12/14/2015   Dr. Michail Sermon: normal     . GIVENS CAPSULE STUDY N/A 08/13/2016   Dr. Oneida Alar: enteritis due to aspirin.   Marland Kitchen HEEL SPUR EXCISION    . LEFT HEART CATHETERIZATION WITH CORONARY ANGIOGRAM N/A 08/23/2013   Procedure: LEFT HEART CATHETERIZATION WITH CORONARY ANGIOGRAM;  Surgeon: Peter M Martinique, MD;  Location: [facility] CATH LAB;  Service: Cardiovascular;  Laterality: N/A;  . PERCUTANEOUS CORONARY STENT INTERVENTION (PCI-S) N/A 08/24/2013   Procedure: PERCUTANEOUS CORONARY STENT INTERVENTION (PCI-S);  Surgeon: Peter M Martinique, MD;  Location: [facility] CATH LAB;  Service: Cardiovascular;  Laterality: N/A;  . TONSILLECTOMY    . TUBAL LIGATION      Current Outpatient Medications  Medication Sig Dispense Refill  . amLODipine (NORVASC) 10 MG tablet Take 1 tablet (10 mg total) by mouth daily. 30 tablet 6  . atorvastatin (LIPITOR) 40 MG tablet TAKE ONE TABLET BY MOUTH DAILY AT 6 PM. 90 tablet 0  . b complex vitamins capsule Take 1 capsule by mouth daily.    . cetirizine (ZYRTEC) 10 MG tablet Take 1 tablet (10 mg total) by mouth as needed. 90 tablet 0  . clopidogrel (PLAVIX) 75 MG tablet Take 1 tablet (75 mg total) by mouth daily. 60 tablet 5  . clotrimazole-betamethasone (LOTRISONE) cream APPLY TO THE AFFECTED AREA TWICE DAILY. 45 g 1  . dexlansoprazole (DEXILANT) 60 MG capsule Take 1 capsule (60 mg total) by mouth daily. 90 capsule 3  .  ferrous gluconate (FERGON) 324 MG tablet TAKE ONE TABLET BY MOUTH DAILY WITH BREAKFAST 90 tablet 0  . HYDROcodone-acetaminophen (NORCO/VICODIN) 5-325 MG tablet Take one tab po q 4-6 hrs prn pain 60 tablet 0  . lisinopril (PRINIVIL,ZESTRIL) 40 MG tablet Take 1 tablet (40 mg total) daily by mouth. 90 tablet 3  . metoprolol succinate (TOPROL-XL) 25 MG 24 hr tablet TAKE 1/2 TABLET BY MOUTH DAILY. 45 tablet 3  . neomycin-polymyxin-hydrocortisone (CORTISPORIN) 3.5-10000-1 OTIC suspension Place 3 drops into both ears 3 (three) times daily. (Patient taking differently: Place 3 drops into both ears as needed. ) 10 mL 6  .  nitroGLYCERIN (NITROSTAT) 0.4 MG SL tablet Place 1 tablet (0.4 mg total) under the tongue every 5 (five) minutes as needed for chest pain. 25 tablet 3  . psyllium (METAMUCIL) 58.6 % powder Take 1 packet by mouth as needed.      No current facility-administered medications for this visit.     Allergies as of 08/26/2017 - Review Complete 08/26/2017  Allergen Reaction Noted  . Nsaids Other (See Comments) 07/25/2016    Family History  Problem Relation Age of Onset  . Hypertension Brother   . Transient ischemic attack Brother   . Cancer Mother        unsure of type   . Asthma Sister   . Stroke Sister   . Stroke Sister   . Early death Brother   . Early death Brother   . Liver cancer Daughter   . Hernia Son        Umbilical  . Pancreatic disease Daughter        pancreatectomy  . Diverticulitis Daughter   . Anemia Daughter   . GER disease Son     Social History   Socioeconomic History  . Marital status: Widowed    Spouse name: Not on file  . Number of children: 8  . Years of education: 86  . Highest education level: Not on file  Occupational History  . 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 Financial resource strain: Not on file  . Food insecurity:    Worry: Not on file    Inability: Not on file  . Transportation needs:    Medical: Not on file    Non-medical: Not on file  Tobacco Use  . Smoking status: Never Smoker  . Smokeless tobacco: Never Used  Substance and Sexual Activity  . Alcohol use: No    Alcohol/week: 0.0 oz  . Drug use: No  . Sexual activity: Never  Lifestyle  . Physical activity:    Days per week: Not on file    Minutes per session: Not on file  . Stress: Not on file  Relationships  . Social connections:    Talks on phone: Not on file    Gets together: Not on file    Attends religious service: Not on file    Active member of club or organization: Not on file    Attends meetings of clubs or organizations: Not on  file    Relationship status: Not on file  Other Topics Concern  . 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 Denies fatigue, weakness, weight loss.  CV: Denies chest  pain, palpitations, syncope, peripheral edema, and claudication. Resp: Denies dyspnea at rest, cough, wheezing, coughing up blood, and pleurisy. GI: see HPI  Derm: Denies rash, itching, dry skin Psych: Denies depression, anxiety, memory loss, confusion. No homicidal or suicidal ideation.  Heme: Denies bruising, bleeding, and enlarged lymph nodes.  Physical Exam: BP (!) 152/64   Pulse 75   Temp (!) 96.7 F (35.9 C) (Oral)   Ht 5\' 4"  (1.626 m)   Wt 233 lb (105.7 kg)   BMI 39.99 kg/m  General:   Alert and oriented. No distress noted. Pleasant and cooperative.  Head:  Normocephalic and atraumatic. Eyes:  Conjuctiva clear without scleral icterus. Mouth:  Oral mucosa pink and moist.  Abdomen:  +BS, soft, non-tender and non-distended. No rebound or guarding. No HSM or masses noted. Msk:  Symmetrical without gross deformities. Normal posture. Extremities:  Without edema. Neurologic:  Alert and  oriented x4 Psych:  Alert and cooperative. Normal mood and affect.  Lab Results  Component Value Date   WBC 5.3 04/15/2017   HGB 11.5 (L) 04/15/2017   HCT 34.6 (L) 04/15/2017   MCV 93.5 04/15/2017   PLT 276 04/15/2017   Lab Results  Component Value Date   ALT 8 11/20/2016   AST 10 11/20/2016   ALKPHOS 85 11/20/2016   BILITOT 0.4 11/20/2016   Lab Results  Component Value Date   IRON 46 01/09/2017   TIBC 196 (L) 01/09/2017   FERRITIN 102 04/15/2017   Lab Results  Component Value Date   CREATININE 1.23 (H) 11/20/2016   BUN 22 11/20/2016   NA 140 11/20/2016   K 4.4 11/20/2016   CL 107 11/20/2016   CO2 26 11/20/2016

## 2017-08-31 ENCOUNTER — Other Ambulatory Visit: Payer: Self-pay | Admitting: Family Medicine

## 2017-08-31 ENCOUNTER — Ambulatory Visit: Payer: Medicare Other | Admitting: Family Medicine

## 2017-08-31 NOTE — Telephone Encounter (Signed)
Please advise 

## 2017-09-01 NOTE — Progress Notes (Signed)
Hgb normal! Ferritin 112. Much improved. Can just take a multivitamin that has iron in it. We will recheck CBC, iron studies at next visit.

## 2017-09-02 NOTE — Progress Notes (Signed)
LMOM to call.

## 2017-09-02 NOTE — Progress Notes (Signed)
PT is aware.

## 2017-09-03 ENCOUNTER — Telehealth: Payer: Self-pay | Admitting: Family Medicine

## 2017-09-03 NOTE — Telephone Encounter (Signed)
Can take mucinex DM, coricidin HBP, saline nasal spray, steroid nasal spray like flonase or nasonex.

## 2017-09-03 NOTE — Telephone Encounter (Signed)
Called patient and advised her of Dr.Nelson's recommendations with verbal understanding. Reminded her of her appointment time on Monday.

## 2017-09-03 NOTE — Telephone Encounter (Signed)
Please advise 

## 2017-09-03 NOTE — Telephone Encounter (Signed)
Please call the PT to advise what she can take for a cold until Monday  [phone]

## 2017-09-07 ENCOUNTER — Other Ambulatory Visit: Payer: Self-pay

## 2017-09-07 ENCOUNTER — Ambulatory Visit (INDEPENDENT_AMBULATORY_CARE_PROVIDER_SITE_OTHER): Payer: Medicare Other | Admitting: Family Medicine

## 2017-09-07 ENCOUNTER — Encounter: Payer: Self-pay | Admitting: Family Medicine

## 2017-09-07 VITALS — BP 130/56 | HR 77 | Temp 98.6°F | Resp 12 | Ht 65.0 in | Wt 232.1 lb

## 2017-09-07 DIAGNOSIS — M1712 Unilateral primary osteoarthritis, left knee: Secondary | ICD-10-CM

## 2017-09-07 DIAGNOSIS — K2100 Gastro-esophageal reflux disease with esophagitis, without bleeding: Secondary | ICD-10-CM

## 2017-09-07 DIAGNOSIS — I10 Essential (primary) hypertension: Secondary | ICD-10-CM

## 2017-09-07 DIAGNOSIS — Z9109 Other allergy status, other than to drugs and biological substances: Secondary | ICD-10-CM

## 2017-09-07 NOTE — Patient Instructions (Signed)
You are doing well No change in medicine or therapy  You will follow up with Dr Mannie Stabile Come back in 6 months

## 2017-09-07 NOTE — Progress Notes (Signed)
Chief Complaint  Patient presents with  . Nasal Congestion  . Cough   Patient is here for routine follow-up She states that she is feeling well except for runny and stuffy nose, postnasal drip, and cough.  She thinks is due to the high pollen count.  She has known environmental allergies.  We discussed what over-the-counter products she can use for this. She has chronic arthritis of her knees both, left greater than right.  She has bone-on-bone arthritis and needs a knee replacement.  She states that she is financially unable to do this.  She still works part-time.  She is raising 2 grandchildren, and states she cannot afford not to work. Her GERD is controlled on her medicine.  No heartburn or nausea.  Weight is stable. She is obese but feels unable to exercise because of her knees, her work schedule, and her family obligations.  It was recommended to her to do pool therapy, she does not have time. Her blood pressure is well controlled on current medicine.  She is compliant with her medicine.  She is good about avoiding salt.   Patient Active Problem List   Diagnosis Date Noted  . Neuralgia of right lower extremity 07/13/2017  . Iron deficiency anemia 08/27/2016  . Morbid obesity ([place]) 08/27/2016  . CKD (chronic kidney disease), stage III ([place]) 04/04/2016  . Osteoarthritis left knee 04/04/2016  . Third degree heart block ([place]) 12/10/2015  . Lower GI bleeding 12/09/2015  . Pancolonic diverticulosis 12/09/2015  . CAD (coronary artery disease) 01/11/2014  . NSTEMI (non-ST elevated myocardial infarction) ([place]) 08/23/2013  . IDA (iron deficiency anemia) 08/23/2013  . Chest pain 08/22/2013  . Essential hypertension 08/22/2013  . GERD (gastroesophageal reflux disease) 08/22/2013  . Anxiety 08/22/2013    Outpatient Encounter Medications as of 09/07/2017  Medication Sig  . amLODipine (NORVASC) 10 MG tablet Take 1 tablet (10 mg total) by mouth daily.  Marland Kitchen atorvastatin (LIPITOR) 40 MG  tablet TAKE ONE TABLET BY MOUTH DAILY AT 6 PM.  . b complex vitamins capsule Take 1 capsule by mouth daily.  . cetirizine (ZYRTEC) 10 MG tablet Take 1 tablet (10 mg total) by mouth as needed.  . clopidogrel (PLAVIX) 75 MG tablet Take 1 tablet (75 mg total) by mouth daily.  . clotrimazole-betamethasone (LOTRISONE) cream APPLY TO THE AFFECTED AREA TWICE DAILY.  Marland Kitchen dexlansoprazole (DEXILANT) 60 MG capsule Take 1 capsule (60 mg total) by mouth daily.  Marland Kitchen HYDROcodone-acetaminophen (NORCO/VICODIN) 5-325 MG tablet TAKE ONE TABLET BY MOUTH EVERY 4 TO 6 HOURS AS NEEDED FOR PAIN  . lisinopril (PRINIVIL,ZESTRIL) 40 MG tablet Take 1 tablet (40 mg total) daily by mouth.  . metoprolol succinate (TOPROL-XL) 25 MG 24 hr tablet TAKE 1/2 TABLET BY MOUTH DAILY.  . Multiple Vitamin (MULTIVITAMIN) tablet Take 1 tablet by mouth daily.  Marland Kitchen neomycin-polymyxin-hydrocortisone (CORTISPORIN) 3.5-10000-1 OTIC suspension Place 3 drops into both ears 3 (three) times daily. (Patient taking differently: Place 3 drops into both ears as needed. )  . nitroGLYCERIN (NITROSTAT) 0.4 MG SL tablet Place 1 tablet (0.4 mg total) under the tongue every 5 (five) minutes as needed for chest pain.  Marland Kitchen psyllium (METAMUCIL) 58.6 % powder Take 1 packet by mouth as needed.   . ferrous gluconate (FERGON) 324 MG tablet TAKE ONE TABLET BY MOUTH DAILY WITH BREAKFAST (Patient not taking: Reported on 09/07/2017)   No facility-administered encounter medications on file as of 09/07/2017.     Allergies  Allergen Reactions  . Nsaids Other (  See Comments)    AVOID all NSAID due to history of GI bleeding    Review of Systems  Constitutional: Negative for activity change, appetite change and unexpected weight change.  HENT: Positive for postnasal drip and rhinorrhea. Negative for congestion, dental problem, ear discharge and ear pain.   Eyes: Negative for redness and visual disturbance.  Respiratory: Positive for cough. Negative for shortness of breath.     Cardiovascular: Negative for chest pain, palpitations and leg swelling.  Gastrointestinal: Negative for abdominal pain, constipation and diarrhea.  Genitourinary: Negative for difficulty urinating and frequency.  Musculoskeletal: Positive for arthralgias, gait problem and joint swelling. Negative for back pain.  Neurological: Negative for dizziness and headaches.  Psychiatric/Behavioral: Negative for dysphoric mood and sleep disturbance. The patient is not nervous/anxious.     Physical Exam  Constitutional: She is oriented to person, place, and time. She appears well-developed.  Pleasant. Obese.  Antalgic gait.  HENT:  Head: Normocephalic and atraumatic.  Right Ear: External ear normal.  Left Ear: External ear normal.  Mouth/Throat: Oropharynx is clear and moist.  Canals clear, TMs normal, swollen nasal membranes, clear rhinorrhea  Eyes: Pupils are equal, round, and reactive to light. Conjunctivae are normal.  Neck: Normal range of motion. Neck supple.  Cardiovascular: Normal rate and regular rhythm.  Murmur heard. Pulmonary/Chest: Effort normal and breath sounds normal. No respiratory distress. She has no wheezes.  Musculoskeletal: Normal range of motion.  Left knee with crepitus and warmth.  Trace pitting edema  Lymphadenopathy:    She has no cervical adenopathy.  Neurological: She is alert and oriented to person, place, and time.  Psychiatric: She has a normal mood and affect. Her behavior is normal. Thought content normal.    BP (!) 130/56   Pulse 77   Temp 98.6 F (37 C) (Oral)   Resp 12   Ht 5\' 5"  (1.651 m)   Wt 232 lb 1.3 oz (105.3 kg)   SpO2 97%   BMI 38.62 kg/m   ASSESSMENT/PLAN:  1. Essential hypertension Well-controlled on current medications  2. Primary osteoarthritis of left knee Unable to take NSAID medicines due to prior history of GI bleeding.  Controlled with Tylenol and as needed hydrocodone.  Infrequently uses pain medication.  Under the care of an  orthopedic surgeon.  End-stage arthritis, patient is putting off surgery.  3. Gastroesophageal reflux disease with esophagitis Controlled on Dexilant.  4. Environmental allergies Discussed over-the-counter products.   Patient Instructions  You are doing well No change in medicine or therapy  You will follow up with Dr Mannie Stabile Come back in 6 months   Raylene Everts, MD

## 2017-09-09 ENCOUNTER — Telehealth: Payer: Self-pay | Admitting: Gastroenterology

## 2017-09-09 NOTE — Telephone Encounter (Signed)
LMOM to call.

## 2017-09-09 NOTE — Telephone Encounter (Signed)
[place] PATIENT, SHE HAS A QUESTION ABOUT WHICH MULTI VITAMIN SHE SHOULD TAKE, ANNA RECOMMENDED SHE STOP THE IRON AND SWITCH TO A VITAMIN

## 2017-09-10 NOTE — Telephone Encounter (Signed)
PT is aware that Roseanne Kaufman, NP had said for her to have a multivitamin with iron.

## 2017-10-22 ENCOUNTER — Encounter: Payer: Self-pay | Admitting: Family Medicine

## 2017-10-23 ENCOUNTER — Encounter: Payer: Self-pay | Admitting: Family Medicine

## 2017-10-30 ENCOUNTER — Telehealth: Payer: Self-pay | Admitting: Family Medicine

## 2017-10-30 NOTE — Telephone Encounter (Signed)
I apologize, but she is not an established patient with me and I have never seen her and we are all booked. Advise that it would be recommended for her to be evaluated at the [facility]. Gwen Her. Mannie Stabile, MD

## 2017-10-30 NOTE — Telephone Encounter (Signed)
Patient called in requesting an appt because her feet/legs are very swollen.  I advised urgent care because Dr.Hagler is fully booked today. She said that is not fair, because that is $75, and she feels like she is being turned away. She says Dr.Nelson never turned her away & that if she had to fill out a survey regarding this experience it would not be a good one.  Let me know what I can do. Cb#: [phone]/ [phone]

## 2017-11-02 NOTE — Telephone Encounter (Signed)
Called patient to discuss Dr.Hagler's recommendation. The call was either disconnected or she hung up.

## 2017-11-10 ENCOUNTER — Ambulatory Visit: Payer: Medicare Other | Admitting: Family Medicine

## 2017-11-23 ENCOUNTER — Encounter: Payer: Self-pay | Admitting: Physician Assistant

## 2017-11-23 ENCOUNTER — Ambulatory Visit: Payer: Medicare Other | Admitting: Physician Assistant

## 2017-11-23 VITALS — BP 142/60 | HR 78 | Ht 62.0 in | Wt 237.0 lb

## 2017-11-23 DIAGNOSIS — I2510 Atherosclerotic heart disease of native coronary artery without angina pectoris: Secondary | ICD-10-CM | POA: Diagnosis not present

## 2017-11-23 DIAGNOSIS — N183 Chronic kidney disease, stage 3 unspecified: Secondary | ICD-10-CM

## 2017-11-23 DIAGNOSIS — I10 Essential (primary) hypertension: Secondary | ICD-10-CM

## 2017-11-23 MED ORDER — AMLODIPINE BESYLATE 5 MG PO TABS: 5.0000 mg | ORAL_TABLET | Freq: Every day | ORAL | 3 refills | Status: DC

## 2017-11-23 MED ORDER — METOPROLOL SUCCINATE ER 25 MG PO TB24: 25.0000 mg | ORAL_TABLET | Freq: Every day | ORAL | 11 refills | Status: DC

## 2017-11-23 NOTE — Progress Notes (Signed)
Cardiology Office Note    Date:  11/23/2017   ID:  Shelly [name], DOB [birth year], MRN [id number]  PCP:  System, Provider Not In  Cardiologist: [name], MD  Chief Complaint  Patient presents with  . Leg Swelling    History of Present Illness:  Shelly [name] is a 77 y.o. female with history of CAD status post NSTEMI treated with overlapping stents in the proximal to mid RCA 08/24/2013 with residual moderate disease, essential hypertension, hyperlipidemia.  Last saw [name] 07/2017 and blood pressure was elevated.  Amlodipine increased to 10 mg daily and diet and exercise modification as well as weight loss were recommended.  Patient comes in today complaining of leg swelling ever since the amlodipine was increased.  Blood pressure has been controlled.  She does not exercise because she needs a knee replacement but says she cannot afford it.  She still works in a group home.  She denies any chest pain, palpitations, dizziness or presyncope.  She has chronic dyspnea on exertion.  Says she does not cook with salt and does not eat out.    Past Medical History:  Diagnosis Date  . Allergy   . Anemia    GI bleed  . Anginal pain ([place])   . Anxiety   . Arthritis   . Blood transfusion without reported diagnosis   . Cataract   . Chronic kidney disease   . Coronary artery disease    SEVERE  . GERD (gastroesophageal reflux disease)   . GI bleed 12/2013  . Goiter   . Hyperlipidemia   . Hypertension   . Myocardial infarction ([place]) 08/2013  . Neuralgia of right lower extremity 07/13/2017  . Pancolonic diverticulosis 12/2013    Past Surgical History:  Procedure Laterality Date  . COLONOSCOPY N/A 01/13/2014   Dr. Hung:pandiverticulosis   . COLONOSCOPY N/A 12/14/2015   Dr. Michail Sermon: pancolonic diverticulosis, internal hemorrhoids   . CORNEAL TRANSPLANT    . CORONARY ANGIOPLASTY  08/2013  . ESOPHAGOGASTRODUODENOSCOPY N/A 12/14/2015   Dr. Michail Sermon: normal   . GIVENS  CAPSULE STUDY N/A 08/13/2016   Dr. Oneida Alar: enteritis due to aspirin.   Marland Kitchen HEEL SPUR EXCISION    . LEFT HEART CATHETERIZATION WITH CORONARY ANGIOGRAM N/A 08/23/2013   Procedure: LEFT HEART CATHETERIZATION WITH CORONARY ANGIOGRAM;  Surgeon: Peter M Martinique, MD;  Location[facility] C[facility] LAB;  Service: Cardiovascular;  Laterality: N/A;  . PERCUTANEOUS CORONARY STENT INTERVENTION (PCI-S) N/A 08/24/2013   Procedure: PERCUTANEOUS CORONARY STENT INTERVENTION (PCI-S);  Surgeon: Peter M Martinique, MD;  Location: [facility] CATH LAB;  Service: Cardiovascular;  Laterality: N/A;  . TONSILLECTOMY    . TUBAL LIGATION      Current Medications: Current Meds  Medication Sig  . amLODipine (NORVASC) 10 MG tablet Take 1 tablet (10 mg total) by mouth daily.  Marland Kitchen atorvastatin (LIPITOR) 40 MG tablet TAKE ONE TABLET BY MOUTH DAILY AT 6 PM.  . b complex vitamins capsule Take 1 capsule by mouth daily.  . cetirizine (ZYRTEC) 10 MG tablet Take 1 tablet (10 mg total) by mouth as needed.  . clopidogrel (PLAVIX) 75 MG tablet Take 1 tablet (75 mg total) by mouth daily.  . clotrimazole-betamethasone (LOTRISONE) cream APPLY TO THE AFFECTED AREA TWICE DAILY.  Marland Kitchen dexlansoprazole (DEXILANT) 60 MG capsule Take 1 capsule (60 mg total) by mouth daily.  . ferrous gluconate (FERGON) 324 MG tablet TAKE ONE TABLET BY MOUTH DAILY WITH BREAKFAST  . HYDROcodone-acetaminophen (NORCO/VICODIN) 5-325 MG tablet TAKE ONE TABLET BY  MOUTH EVERY 4 TO 6 HOURS AS NEEDED FOR PAIN  . lisinopril (PRINIVIL,ZESTRIL) 40 MG tablet Take 1 tablet (40 mg total) daily by mouth.  . metoprolol succinate (TOPROL-XL) 25 MG 24 hr tablet TAKE 1/2 TABLET BY MOUTH DAILY.  . Multiple Vitamin (MULTIVITAMIN) tablet Take 1 tablet by mouth daily.  Marland Kitchen neomycin-polymyxin-hydrocortisone (CORTISPORIN) 3.5-10000-1 OTIC suspension Place 3 drops into both ears 3 (three) times daily. (Patient taking differently: Place 3 drops into both ears as needed. )  . nitroGLYCERIN (NITROSTAT) 0.4 MG SL tablet Place 1  tablet (0.4 mg total) under the tongue every 5 (five) minutes as needed for chest pain.  Marland Kitchen psyllium (METAMUCIL) 58.6 % powder Take 1 packet by mouth as needed.      Allergies:   Nsaids   Social History   Socioeconomic History  . Marital status: Widowed    Spouse name: Not on file  . Number of children: 8  . Years of education: 71  . Highest education level: Not on file  Occupational History  . 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 Financial resource strain: Not on file  . Food insecurity:    Worry: Not on file    Inability: Not on file  . Transportation needs:    Medical: Not on file    Non-medical: Not on file  Tobacco Use  . Smoking status: Never Smoker  . Smokeless tobacco: Never Used  Substance and Sexual Activity  . Alcohol use: No    Alcohol/week: 0.0 oz  . Drug use: No  . Sexual activity: Never  Lifestyle  . Physical activity:    Days per week: Not on file    Minutes per session: Not on file  . Stress: Not on file  Relationships  . Social connections:    Talks on phone: Not on file    Gets together: Not on file    Attends religious service: Not on file    Active member of club or organization: Not on file    Attends meetings of clubs or organizations: Not on file    Relationship status: Not on file  Other Topics Concern  . Not on file  Social History Narrative   Works in a group home   Full time   Lives with daughter   Two grand daughters    One son home most of the time     Family History:  The patient's family history includes Anemia in her daughter; Asthma in her sister; Cancer in her mother; Diverticulitis in her daughter; Early death in her brother and brother; GER disease in her son; Hernia in her son; Hypertension in her brother; Liver cancer in her daughter; Pancreatic disease in her daughter; Stroke in her sister and sister; Transient ischemic attack in her brother.   ROS:   Please see the history of present  illness.    Review of Systems  Constitution: Positive for decreased appetite.  HENT: Negative.   Eyes: Positive for visual disturbance.  Cardiovascular: Positive for dyspnea on exertion and leg swelling.  Respiratory: Negative.   Hematologic/Lymphatic: Bruises/bleeds easily.  Musculoskeletal: Positive for back pain and joint pain.  Gastrointestinal: Negative.   Genitourinary: Negative.   Neurological: Negative.    All other systems reviewed and are negative.   PHYSICAL EXAM:   VS:  BP (!) 142/60   Pulse 78   Ht 5\' 2"  (1.575 m)   Wt 237 lb (107.5 kg)   SpO2 98%  BMI 43.35 kg/m   Physical Exam  GEN: Obese, in no acute distress  Neck: no JVD, carotid bruits, or masses Cardiac:RRR; positive S4 no murmurs, rubs, Respiratory:  clear to auscultation bilaterally, normal work of breathing GI: soft, nontender, nondistended, + BS Ext: Trace of edema bilaterally without cyanosis, clubbing,.decreased distal pulses bilaterally Neuro:  Alert and Oriented x 3 Psych: euthymic mood, full affect  Wt Readings from Last 3 Encounters:  11/23/17 237 lb (107.5 kg)  09/07/17 232 lb 1.3 oz (105.3 kg)  08/26/17 233 lb (105.7 kg)      Studies/Labs Reviewed:   EKG:  EKG is not ordered today.  Recent Labs: 08/26/2017: Hemoglobin 12.0; Platelets 273   Lipid Panel    Component Value Date/Time   CHOL 140 11/20/2016 0854   TRIG 54 11/20/2016 0854   HDL 53 11/20/2016 0854   CHOLHDL 2.6 11/20/2016 0854   VLDL 11 11/20/2016 0854   LDLCALC 76 11/20/2016 0854    Additional studies/ records that were reviewed today include:  2D echo 2017Study Conclusions   - Left ventricle: The cavity size was normal. Wall thickness was   increased in a pattern of mild LVH. Systolic function was normal.   The estimated ejection fraction was in the range of 55% to 60%.   Wall motion was normal; there were no regional wall motion   abnormalities. Left ventricular diastolic function parameters   were normal. -  Atrial septum: No defect or patent foramen ovale was identified.  2D echo 2015Coronary angiography: Coronary dominance: right   Left mainstem: The left main is very short and essentially a shared ostium for the LAD and LCx. No significant left main disease.   Left anterior descending (LAD): The LAD was incompletely visualized due to the catheter selectively engaging the LCx. By flush shots there appears to be moderate mid LAD disease of approximately 50-60%.   Left circumflex (LCx): There is 40% disease in the proximal and mid LCx. The first OM has 40% ostial disease.    Right coronary artery (RCA): The RCA is a large dominant vessel. There is moderate calcification in the proximal and mid vessel. The proximal vessel is diffusely diseased up to 50%. The mid vessel has tandem 80% then 90% lesions in a very angulated segment.    Left ventriculography: Left ventricular systolic function is normal, LVEF is estimated at 55-65%, there is no significant mitral regurgitation    Final Conclusions:   1. Single vessel obstructive CAD with complex disease in the proximal to mid RCA. While the LAD was incompletely visualized, it does not appear to have severe disease. 2. Normal LV function.   Recommendations: Recommend PCI of the RCA. Will load with DAPT. Will plan on PCI on 08/24/13 from a femoral approach. I would not use a right radial approach in the future due to extreme tortuosity of the innominate artery and poor catheter engagement. At the time of PCI will relook at the LAD with better catheter support.    Collier Salina [facility] 08/23/2013, 11:41 AM    ASSESSMENT:    1. Coronary artery disease involving native coronary artery of native heart without angina pectoris   2. Essential hypertension   3. Morbid obesity ([place])   4. CKD (chronic kidney disease), stage III (HCC)      PLAN:  In order of problems listed above:  CAD status post NSTEMI treated with overlapping stents in the RCA with  residual disease in the circumflex and LAD.  No angina  Essential  hypertension patient's blood pressure is controlled but she developed edema on the higher dose of amlodipine.  We will decrease amlodipine to 5 mg daily and increase Toprol to 25 mg once daily.  Come back for blood pressure check in 2 weeks.  Follow-up with [name] in September.  Weight loss would help.  Morbid obesity exercise and weight loss recommended.  Patient has trouble exercising because of chronic knee problems which she cannot afford a knee replacement.  CKD stage III    Medication Adjustments/Labs and Tests Ordered: Current medicines are reviewed at length with the patient today.  Concerns regarding medicines are outlined above.  Medication changes, Labs and Tests ordered today are listed in the Patient Instructions below. There are no Patient Instructions on file for this visit.   Signed, Ermalinda Barrios, PA-C  11/23/2017 1:56 PM    [facility] [address], [place], [place]  [address] Phone: [phone]; Fax: [phone]

## 2017-11-23 NOTE — Patient Instructions (Signed)
Medication Instructions:  Your physician has recommended you make the following change in your medication:  Decrease Norvasc to 5 mg Daily   Increase Toprol to 25 mg Daily    Labwork: NONE   Testing/Procedures: NONE   Follow-Up: Your physician recommends that you schedule a follow-up appointment in: 2 Week for Blood Pressure Check  Your physician recommends that you schedule a follow-up appointment with Dr. Dwana Curd   Any Other Special Instructions Will Be Listed Below (If Applicable).     If you need a refill on your cardiac medications before your next appointment, please call your pharmacy.

## 2017-11-25 ENCOUNTER — Ambulatory Visit: Payer: Medicare Other | Admitting: Gastroenterology

## 2017-11-25 ENCOUNTER — Encounter: Payer: Self-pay | Admitting: Gastroenterology

## 2017-11-25 VITALS — BP 144/73 | HR 75 | Temp 97.7°F | Ht 62.0 in | Wt 237.0 lb

## 2017-11-25 DIAGNOSIS — D508 Other iron deficiency anemias: Secondary | ICD-10-CM | POA: Diagnosis not present

## 2017-11-25 NOTE — Patient Instructions (Signed)
I have ordered blood work today. I would wait until you see Dr. Warrick Parisian on Friday prior to getting this drawn, just in case he wants to order additional labs.  I will see you in 3 months!  It was a pleasure to see you today. I strive to create trusting relationships with patients to provide genuine, compassionate, and quality care. I value your feedback. If you receive a survey regarding your visit,  I greatly appreciate you taking time to fill this out.   Annitta Needs, PhD, ANP-BC [facility]

## 2017-11-25 NOTE — Progress Notes (Signed)
Primary Care Physician:  Dr. Warrick Parisian, establishing care this week Primary GI: Dr. Oneida Alar   Chief Complaint  Patient presents with  . Anemia    f/u. Doing okay  . Gastroesophageal Reflux    doing well on dexilant    HPI:   Shelly [name] is a 77 y.o. female presenting today with a history of GI bleeding, inpatient in Nov 2017. EGD and colonoscopy July 2017 by Dr. Michail Sermon with normal EGD and colonoscopy showing pancolonic diverticulosis. Presumed diverticular bleed at that time. Nov 2017 readmitted with suspected diverticular bleed in setting of oral diclofenac.Due to IDA, capsule study was completed. This showed enteritis due to aspirin use. History of parenteral iron in July 2018. IDA resolved.   No rectal bleeding. No abdominal pain, N/V. Appetite is poor sometimes. No dysphagia. She worries about lower extremity edema, specifically around her ankles. Norvasc recently adjusted at cardiology visit. Looking forward to establishing care with a new PCP on Friday.      Past Medical History:  Diagnosis Date  . Allergy   . Anemia    GI bleed  . Anginal pain ([place])   . Anxiety   . Arthritis   . Blood transfusion without reported diagnosis   . Cataract   . Chronic kidney disease   . Coronary artery disease    SEVERE  . GERD (gastroesophageal reflux disease)   . GI bleed 12/2013  . Goiter   . Hyperlipidemia   . Hypertension   . Myocardial infarction ([place]) 08/2013  . Neuralgia of right lower extremity 07/13/2017  . Pancolonic diverticulosis 12/2013    Past Surgical History:  Procedure Laterality Date  . COLONOSCOPY N/A 01/13/2014   Dr. Hung:pandiverticulosis   . COLONOSCOPY N/A 12/14/2015   Dr. Michail Sermon: pancolonic diverticulosis, internal hemorrhoids   . CORNEAL TRANSPLANT    . CORONARY ANGIOPLASTY  08/2013  . ESOPHAGOGASTRODUODENOSCOPY N/A 12/14/2015   Dr. Michail Sermon: normal   . GIVENS CAPSULE STUDY N/A 08/13/2016   Dr. Oneida Alar: enteritis due to aspirin.   Marland Kitchen HEEL SPUR  EXCISION    . LEFT HEART CATHETERIZATION WITH CORONARY ANGIOGRAM N/A 08/23/2013   Procedure: LEFT HEART CATHETERIZATION WITH CORONARY ANGIOGRAM;  Surgeon: Peter M Martinique, MD;  Location: [facility] CATH LAB;  Service: Cardiovascular;  Laterality: N/A;  . PERCUTANEOUS CORONARY STENT INTERVENTION (PCI-S) N/A 08/24/2013   Procedure: PERCUTANEOUS CORONARY STENT INTERVENTION (PCI-S);  Surgeon: Peter M Martinique, MD;  Location: [facility] CATH LAB;  Service: Cardiovascular;  Laterality: N/A;  . TONSILLECTOMY    . TUBAL LIGATION      Current Outpatient Medications  Medication Sig Dispense Refill  . amLODipine (NORVASC) 5 MG tablet Take 1 tablet (5 mg total) by mouth daily. 180 tablet 3  . atorvastatin (LIPITOR) 40 MG tablet TAKE ONE TABLET BY MOUTH DAILY AT 6 PM. 90 tablet 0  . b complex vitamins capsule Take 1 capsule by mouth daily.    . cetirizine (ZYRTEC) 10 MG tablet Take 1 tablet (10 mg total) by mouth as needed. 90 tablet 0  . clopidogrel (PLAVIX) 75 MG tablet Take 1 tablet (75 mg total) by mouth daily. 60 tablet 5  . clotrimazole-betamethasone (LOTRISONE) cream APPLY TO THE AFFECTED AREA TWICE DAILY. 45 g 1  . dexlansoprazole (DEXILANT) 60 MG capsule Take 1 capsule (60 mg total) by mouth daily. 90 capsule 3  . HYDROcodone-acetaminophen (NORCO/VICODIN) 5-325 MG tablet TAKE ONE TABLET BY MOUTH EVERY 4 TO 6 HOURS AS NEEDED FOR PAIN 30 tablet 0  . lisinopril (  PRINIVIL,ZESTRIL) 40 MG tablet Take 1 tablet (40 mg total) daily by mouth. 90 tablet 3  . metoprolol succinate (TOPROL XL) 25 MG 24 hr tablet Take 1 tablet (25 mg total) by mouth daily. 30 tablet 11  . Multiple Vitamin (MULTIVITAMIN) tablet Take 1 tablet by mouth daily.    Marland Kitchen neomycin-polymyxin-hydrocortisone (CORTISPORIN) 3.5-10000-1 OTIC suspension Place 3 drops into both ears 3 (three) times daily. (Patient taking differently: Place 3 drops into both ears as needed. ) 10 mL 6  . nitroGLYCERIN (NITROSTAT) 0.4 MG SL tablet Place 1 tablet (0.4 mg total) under the  tongue every 5 (five) minutes as needed for chest pain. 25 tablet 3  . psyllium (METAMUCIL) 58.6 % powder Take 1 packet by mouth as needed.      No current facility-administered medications for this visit.     Allergies as of 11/25/2017 - Review Complete 11/23/2017  Allergen Reaction Noted  . Nsaids Other (See Comments) 07/25/2016    Family History  Problem Relation Age of Onset  . Hypertension Brother   . Transient ischemic attack Brother   . Cancer Mother        unsure of type   . Asthma Sister   . Stroke Sister   . Stroke Sister   . Early death Brother   . Early death Brother   . Liver cancer Daughter   . Hernia Son        Umbilical  . Pancreatic disease Daughter        pancreatectomy  . Diverticulitis Daughter   . Anemia Daughter   . GER disease Son     Social History   Socioeconomic History  . Marital status: Widowed    Spouse name: Not on file  . Number of children: 8  . Years of education: 22  . Highest education level: Not on file  Occupational History  . 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 Financial resource strain: Not on file  . Food insecurity:    Worry: Not on file    Inability: Not on file  . Transportation needs:    Medical: Not on file    Non-medical: Not on file  Tobacco Use  . Smoking status: Never Smoker  . Smokeless tobacco: Never Used  Substance and Sexual Activity  . Alcohol use: No    Alcohol/week: 0.0 oz  . Drug use: No  . Sexual activity: Never  Lifestyle  . Physical activity:    Days per week: Not on file    Minutes per session: Not on file  . Stress: Not on file  Relationships  . Social connections:    Talks on phone: Not on file    Gets together: Not on file    Attends religious service: Not on file    Active member of club or organization: Not on file    Attends meetings of clubs or organizations: Not on file    Relationship status: Not on file  Other Topics Concern  . Not on file    Social History Narrative   Works in a group home   Full time   Lives with daughter   Two grand daughters    One son home most of the time    Review of Systems: Gen: Denies fever, chills, anorexia. Denies fatigue, weakness, weight loss.  CV: Denies chest pain, palpitations, syncope, peripheral edema, and claudication. Resp: Denies dyspnea at rest, cough, wheezing, coughing up blood, and pleurisy. GI:  see HPI  Derm: Denies rash, itching, dry skin Psych: Denies depression, anxiety, memory loss, confusion. No homicidal or suicidal ideation.  Heme: Denies bruising, bleeding, and enlarged lymph nodes.  Physical Exam: BP (!) 144/73   Pulse 75   Temp 97.7 F (36.5 C) (Oral)   Ht 5\' 2"  (1.575 m)   Wt 237 lb (107.5 kg)   BMI 43.35 kg/m  General:   Alert and oriented. No distress noted. Pleasant and cooperative.  Head:  Normocephalic and atraumatic. Eyes:  Conjuctiva clear without scleral icterus. Mouth:  Oral mucosa pink and moist.  Abdomen:  +BS, soft, non-tender and non-distended. No rebound or guarding. No HSM or masses noted. Msk:  Symmetrical without gross deformities. Normal posture. Extremities:  With ankle edema, non-pitting  Neurologic:  Alert and  oriented x4 Psych:  Alert and cooperative. Normal mood and affect.  Lab Results  Component Value Date   WBC 5.7 08/26/2017   HGB 12.0 08/26/2017   HCT 36.1 08/26/2017   MCV 93.0 08/26/2017   PLT 273 08/26/2017   Lab Results  Component Value Date   IRON 53 08/26/2017   TIBC 219 (L) 08/26/2017   FERRITIN 112 08/26/2017

## 2017-11-25 NOTE — Assessment & Plan Note (Signed)
Pleasant 77 year old female with history of IDA in past secondary to aspirin use, with EGD/colonoscopy, capsule on file as noted in HPI. Most recent CBC, iron studies with resolution of IDA. No overt GI bleeding. Will recheck now. She is doing well, and I have told her we could see her back in 6 months to a year, but she desires close follow-up in 3 month intervals. Return in 3 months. Continue Dexilant.

## 2017-11-25 NOTE — Progress Notes (Signed)
cc'ed to pcp °

## 2017-11-27 ENCOUNTER — Telehealth: Payer: Self-pay

## 2017-11-27 ENCOUNTER — Encounter: Payer: Self-pay | Admitting: Family Medicine

## 2017-11-27 ENCOUNTER — Ambulatory Visit (INDEPENDENT_AMBULATORY_CARE_PROVIDER_SITE_OTHER): Payer: Medicare Other | Admitting: Family Medicine

## 2017-11-27 ENCOUNTER — Other Ambulatory Visit: Payer: Self-pay

## 2017-11-27 VITALS — BP 134/83 | HR 72 | Temp 97.7°F | Ht 62.0 in | Wt 237.0 lb

## 2017-11-27 DIAGNOSIS — D509 Iron deficiency anemia, unspecified: Secondary | ICD-10-CM

## 2017-11-27 DIAGNOSIS — I442 Atrioventricular block, complete: Secondary | ICD-10-CM | POA: Diagnosis not present

## 2017-11-27 DIAGNOSIS — M7989 Other specified soft tissue disorders: Secondary | ICD-10-CM | POA: Diagnosis not present

## 2017-11-27 DIAGNOSIS — I252 Old myocardial infarction: Secondary | ICD-10-CM | POA: Diagnosis not present

## 2017-11-27 DIAGNOSIS — I10 Essential (primary) hypertension: Secondary | ICD-10-CM

## 2017-11-27 DIAGNOSIS — N183 Chronic kidney disease, stage 3 unspecified: Secondary | ICD-10-CM

## 2017-11-27 DIAGNOSIS — I2510 Atherosclerotic heart disease of native coronary artery without angina pectoris: Secondary | ICD-10-CM

## 2017-11-27 DIAGNOSIS — K219 Gastro-esophageal reflux disease without esophagitis: Secondary | ICD-10-CM

## 2017-11-27 NOTE — Progress Notes (Signed)
BP 134/83   Pulse 72   Temp 97.7 F (36.5 C) (Oral)   Ht '5\' 2"'$  (1.575 m)   Wt 237 lb (107.5 kg)   BMI 43.35 kg/m    Subjective:    Patient ID: Shelly [name], female    DOB: [birth year], 77 y.o.   MRN: [id number]  HPI: Shelly [name] is a 77 y.o. female presenting on 11/27/2017 for Establish Care   HPI Hypertension Patient is coming to establish care patient is currently on amlodipine and lisinopril and metoprolol, and their blood pressure today is 134/83. Patient denies any lightheadedness or dizziness. Patient denies headaches, blurred vision, chest pains, shortness of breath, or weakness. Denies any side effects from medication and is content with current medication.   GERD Patient is currently on [place].  She denies any major symptoms or abdominal pain or belching or burping. She denies any blood in her stool or lightheadedness or dizziness.  Patient does have a gastroenterologist that she sees Dr. Cyndi Bender.  Third-degree heart block and CAD history and leg swelling and CKD stage III Patient has known CKD stage III and CAD and a history of an STEMI and third-degree heart block with some peripheral edema.  She sees [name] a cardiologist who manages a lot of the heart stuff.  It sounds like the CKD has been stable.  Patient currently takes metoprolol and lisinopril and Lipitor and Plavix for heart maintenance and prevention.  She denies any chest pain or shortness of breath today.  She has mild peripheral edema today.  Relevant past medical, surgical, family and social history reviewed and updated as indicated. Interim medical history since our last visit reviewed. Allergies and medications reviewed and updated.  Review of Systems  Constitutional: Negative for chills and fever.  HENT: Negative for congestion, ear discharge and ear pain.   Eyes: Negative for redness and visual disturbance.  Respiratory: Negative for chest tightness, shortness of breath and wheezing.     Cardiovascular: Negative for chest pain, palpitations and leg swelling.  Genitourinary: Negative for difficulty urinating and dysuria.  Musculoskeletal: Negative for back pain and gait problem.  Skin: Negative for rash.  Neurological: Negative for light-headedness and headaches.  Psychiatric/Behavioral: Negative for agitation and behavioral problems.  All other systems reviewed and are negative.   Per HPI unless specifically indicated above  Social History   Socioeconomic History  . Marital status: Widowed    Spouse name: Not on file  . Number of children: 8  . Years of education: 39  . Highest education level: Not on file  Occupational History  . 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 Financial resource strain: Not on file  . Food insecurity:    Worry: Not on file    Inability: Not on file  . Transportation needs:    Medical: Not on file    Non-medical: Not on file  Tobacco Use  . Smoking status: Never Smoker  . Smokeless tobacco: Never Used  Substance and Sexual Activity  . Alcohol use: No    Alcohol/week: 0.0 oz  . Drug use: No  . Sexual activity: Not Currently    Comment: divorced, lives with daughter and granddaughters  Lifestyle  . Physical activity:    Days per week: Not on file    Minutes per session: Not on file  . Stress: Not on file  Relationships  . Social connections:    Talks on phone: Not on file  Gets together: Not on file    Attends religious service: Not on file    Active member of club or organization: Not on file    Attends meetings of clubs or organizations: Not on file    Relationship status: Not on file  . Intimate partner violence:    Fear of current or ex partner: Not on file    Emotionally abused: Not on file    Physically abused: Not on file    Forced sexual activity: Not on file  Other Topics Concern  . Not on file  Social History Narrative   Works in a group home   Full time   Lives with  daughter   Two grand daughters    One son home most of the time    Past Surgical History:  Procedure Laterality Date  . COLONOSCOPY N/A 01/13/2014   Dr. Hung:pandiverticulosis   . COLONOSCOPY N/A 12/14/2015   Dr. Michail Sermon: pancolonic diverticulosis, internal hemorrhoids   . CORNEAL TRANSPLANT    . CORONARY ANGIOPLASTY  08/2013  . ESOPHAGOGASTRODUODENOSCOPY N/A 12/14/2015   Dr. Michail Sermon: normal   . GIVENS CAPSULE STUDY N/A 08/13/2016   Dr. Oneida Alar: enteritis due to aspirin.   Marland Kitchen HEEL SPUR EXCISION    . LEFT HEART CATHETERIZATION WITH CORONARY ANGIOGRAM N/A 08/23/2013   Procedure: LEFT HEART CATHETERIZATION WITH CORONARY ANGIOGRAM;  Surgeon: Peter M Martinique, MD;  Location: [facility] CATH LAB;  Service: Cardiovascular;  Laterality: N/A;  . PERCUTANEOUS CORONARY STENT INTERVENTION (PCI-S) N/A 08/24/2013   Procedure: PERCUTANEOUS CORONARY STENT INTERVENTION (PCI-S);  Surgeon: Peter M Martinique, MD;  Location: [facility] CATH LAB;  Service: Cardiovascular;  Laterality: N/A;  . TONSILLECTOMY    . TUBAL LIGATION      Family History  Problem Relation Age of Onset  . Hypertension Brother   . Transient ischemic attack Brother   . Cancer Mother        unsure of type   . COPD Mother   . Asthma Sister   . Stroke Sister   . Stroke Sister   . Early death Brother   . Early death Brother   . Liver cancer Daughter   . Hernia Son        Umbilical  . Pancreatic disease Daughter        pancreatectomy  . Diverticulitis Daughter   . Anemia Daughter   . GER disease Son     Allergies as of 11/27/2017      Reactions   Nsaids Other (See Comments)   AVOID all NSAID due to history of GI bleeding      Medication List        Accurate as of 11/27/17 10:27 AM. Always use your most recent med list.          amLODipine 5 MG tablet Commonly known as:  NORVASC Take 1 tablet (5 mg total) by mouth daily.   atorvastatin 40 MG tablet Commonly known as:  LIPITOR TAKE ONE TABLET BY MOUTH DAILY AT 6 PM.   b complex  vitamins capsule Take 1 capsule by mouth daily.   cetirizine 10 MG tablet Commonly known as:  ZYRTEC Take 1 tablet (10 mg total) by mouth as needed.   clopidogrel 75 MG tablet Commonly known as:  PLAVIX Take 1 tablet (75 mg total) by mouth daily.   clotrimazole-betamethasone cream Commonly known as:  LOTRISONE APPLY TO THE AFFECTED AREA TWICE DAILY.   dexlansoprazole 60 MG capsule Commonly known as:  DEXILANT Take 1 capsule (60  mg total) by mouth daily.   HYDROcodone-acetaminophen 5-325 MG tablet Commonly known as:  NORCO/VICODIN TAKE ONE TABLET BY MOUTH EVERY 4 TO 6 HOURS AS NEEDED FOR PAIN   lisinopril 40 MG tablet Commonly known as:  PRINIVIL,ZESTRIL Take 1 tablet (40 mg total) daily by mouth.   metoprolol succinate 25 MG 24 hr tablet Commonly known as:  TOPROL XL Take 1 tablet (25 mg total) by mouth daily.   multivitamin tablet Take 1 tablet by mouth daily.   neomycin-polymyxin-hydrocortisone 3.5-10000-1 OTIC suspension Commonly known as:  CORTISPORIN Place 3 drops into both ears 3 (three) times daily.   nitroGLYCERIN 0.4 MG SL tablet Commonly known as:  NITROSTAT Place 1 tablet (0.4 mg total) under the tongue every 5 (five) minutes as needed for chest pain.   psyllium 58.6 % powder Commonly known as:  METAMUCIL Take 1 packet by mouth as needed.          Objective:    BP 134/83   Pulse 72   Temp 97.7 F (36.5 C) (Oral)   Ht '5\' 2"'$  (1.575 m)   Wt 237 lb (107.5 kg)   BMI 43.35 kg/m   Wt Readings from Last 3 Encounters:  11/27/17 237 lb (107.5 kg)  11/25/17 237 lb (107.5 kg)  11/23/17 237 lb (107.5 kg)    Physical Exam  Constitutional: She is oriented to person, place, and time. She appears well-developed and well-nourished. No distress.  Eyes: Pupils are equal, round, and reactive to light. Conjunctivae and EOM are normal. No scleral icterus.  Neck: Neck supple. No thyromegaly present.  Cardiovascular: Normal rate, regular rhythm, normal heart  sounds and intact distal pulses.  No murmur heard. Pulmonary/Chest: Effort normal and breath sounds normal. No respiratory distress. She has no wheezes.  Musculoskeletal: Normal range of motion. She exhibits no edema.  Lymphadenopathy:    She has no cervical adenopathy.  Neurological: She is alert and oriented to person, place, and time. Coordination normal.  Skin: Skin is warm and dry. No rash noted. She is not diaphoretic.  Psychiatric: She has a normal mood and affect. Her behavior is normal.  Nursing note and vitals reviewed.       Assessment & Plan:   Problem List Items Addressed This Visit      Cardiovascular and Mediastinum   Essential hypertension - Primary   Relevant Orders   Lipid panel (Completed)   CAD (coronary artery disease)   Relevant Orders   Lipid panel (Completed)   Third degree heart block (HCC)     Digestive   GERD (gastroesophageal reflux disease)   Relevant Orders   Anemia Profile B (Completed)     Genitourinary   CKD (chronic kidney disease), stage III ([place])   Relevant Orders   CMP14+EGFR (Completed)   CBC with Differential/Platelet     Other   History of non-ST elevation myocardial infarction (NSTEMI)   Morbid obesity ([place])    Other Visit Diagnoses    Leg swelling           Follow up plan: Return in about 6 months (around 05/30/2018), or if symptoms worsen or fail to improve, for htn and gerd recheck.  Caryl Pina, MD [facility] 11/27/2017, 10:27 AM

## 2017-11-27 NOTE — Telephone Encounter (Signed)
She will need a specific pain contract initial visit to establish with this.  We can go ahead and schedule her for that whenever she wants to.

## 2017-11-27 NOTE — Telephone Encounter (Signed)
T/C from [facility], requesting the labs be re-entered for [facility]. I have put the orders in and released for them.

## 2017-11-27 NOTE — Telephone Encounter (Signed)
Patient forgot to ask at her appointment about refilling her Hydrocodone.  Looks like it was last filled in April for 30 tablets.  Please advise.

## 2017-11-27 NOTE — Telephone Encounter (Signed)
lmtcb jkp 7/12

## 2017-11-28 LAB — LIPID PANEL
CHOLESTEROL TOTAL: 161 mg/dL (ref 100–199)
Chol/HDL Ratio: 2.9 ratio (ref 0.0–4.4)
HDL: 55 mg/dL (ref >39)
LDL CALC: 89 mg/dL (ref 0–99)
TRIGLYCERIDES: 85 mg/dL (ref 0–149)
VLDL Cholesterol Cal: 17 mg/dL (ref 5–40)

## 2017-11-28 LAB — CMP14+EGFR
ALK PHOS: 100 IU/L (ref 39–117)
ALT: 16 IU/L (ref 0–32)
AST: 13 IU/L (ref 0–40)
Albumin/Globulin Ratio: 1.5 (ref 1.2–2.2)
Albumin: 4.1 g/dL (ref 3.5–4.8)
BUN/Creatinine Ratio: 15 (ref 12–28)
BUN: 15 mg/dL (ref 8–27)
Bilirubin Total: 0.3 mg/dL (ref 0.0–1.2)
CALCIUM: 9.9 mg/dL (ref 8.7–10.3)
CO2: 24 mmol/L (ref 20–29)
Chloride: 108 mmol/L — ABNORMAL HIGH (ref 96–106)
Creatinine, Ser: 1.03 mg/dL — ABNORMAL HIGH (ref 0.57–1.00)
GFR calc Af Amer: 61 mL/min/{1.73_m2} (ref >59)
GFR, EST NON AFRICAN AMERICAN: 53 mL/min/{1.73_m2} — AB (ref >59)
GLOBULIN, TOTAL: 2.7 g/dL (ref 1.5–4.5)
GLUCOSE: 105 mg/dL — AB (ref 65–99)
Potassium: 4.7 mmol/L (ref 3.5–5.2)
SODIUM: 142 mmol/L (ref 134–144)
Total Protein: 6.8 g/dL (ref 6.0–8.5)

## 2017-11-28 LAB — CBC WITH DIFFERENTIAL/PLATELET
BASOS: 0 %
Basophils Absolute: 0 10*3/uL (ref 0.0–0.2)
EOS (ABSOLUTE): 0.1 10*3/uL (ref 0.0–0.4)
Eos: 2 %
HEMATOCRIT: 36 % (ref 34.0–46.6)
Hemoglobin: 11.9 g/dL (ref 11.1–15.9)
IMMATURE GRANULOCYTES: 0 %
Immature Grans (Abs): 0 10*3/uL (ref 0.0–0.1)
Lymphocytes Absolute: 1.7 10*3/uL (ref 0.7–3.1)
Lymphs: 36 %
MCH: 31.1 pg (ref 26.6–33.0)
MCHC: 33.1 g/dL (ref 31.5–35.7)
MCV: 94 fL (ref 79–97)
Monocytes Absolute: 0.3 10*3/uL (ref 0.1–0.9)
Monocytes: 7 %
NEUTROS PCT: 55 %
Neutrophils Absolute: 2.7 10*3/uL (ref 1.4–7.0)
Platelets: 293 10*3/uL (ref 150–450)
RBC: 3.83 x10E6/uL (ref 3.77–5.28)
RDW: 14.3 % (ref 12.3–15.4)
WBC: 4.8 10*3/uL (ref 3.4–10.8)

## 2017-11-28 LAB — ANEMIA PROFILE B
BASOS ABS: 0 10*3/uL (ref 0.0–0.2)
Basos: 0 %
EOS (ABSOLUTE): 0.1 10*3/uL (ref 0.0–0.4)
EOS: 2 %
Ferritin: 153 ng/mL — ABNORMAL HIGH (ref 15–150)
HEMATOCRIT: 36.6 % (ref 34.0–46.6)
Hemoglobin: 11.8 g/dL (ref 11.1–15.9)
IMMATURE GRANS (ABS): 0 10*3/uL (ref 0.0–0.1)
IMMATURE GRANULOCYTES: 0 %
Iron Saturation: 32 % (ref 15–55)
Iron: 66 ug/dL (ref 27–139)
LYMPHS: 34 %
Lymphocytes Absolute: 1.6 10*3/uL (ref 0.7–3.1)
MCH: 30.4 pg (ref 26.6–33.0)
MCHC: 32.2 g/dL (ref 31.5–35.7)
MCV: 94 fL (ref 79–97)
Monocytes Absolute: 0.3 10*3/uL (ref 0.1–0.9)
Monocytes: 7 %
NEUTROS PCT: 57 %
Neutrophils Absolute: 2.6 10*3/uL (ref 1.4–7.0)
PLATELETS: 303 10*3/uL (ref 150–450)
RBC: 3.88 x10E6/uL (ref 3.77–5.28)
RDW: 14.6 % (ref 12.3–15.4)
Retic Ct Pct: 2 % (ref 0.6–2.6)
Total Iron Binding Capacity: 208 ug/dL — ABNORMAL LOW (ref 250–450)
UIBC: 142 ug/dL (ref 118–369)
Vitamin B-12: 571 pg/mL (ref 232–1245)
WBC: 4.6 10*3/uL (ref 3.4–10.8)

## 2017-11-28 LAB — IRON,TIBC AND FERRITIN PANEL
FERRITIN: 150 ng/mL (ref 15–150)
IRON SATURATION: 33 % (ref 15–55)
IRON: 68 ug/dL (ref 27–139)
Total Iron Binding Capacity: 206 ug/dL — ABNORMAL LOW (ref 250–450)
UIBC: 138 ug/dL (ref 118–369)

## 2017-11-28 NOTE — Telephone Encounter (Signed)
Pt rc for Jan

## 2017-11-30 NOTE — Telephone Encounter (Signed)
Left message for patient to call to schedule for pain management and signing a contract, per provider request.

## 2017-12-03 ENCOUNTER — Encounter: Payer: Self-pay | Admitting: Family Medicine

## 2017-12-03 ENCOUNTER — Ambulatory Visit (INDEPENDENT_AMBULATORY_CARE_PROVIDER_SITE_OTHER): Payer: Medicare Other | Admitting: Family Medicine

## 2017-12-03 VITALS — BP 132/83 | HR 72 | Temp 98.8°F | Ht 62.0 in | Wt 235.0 lb

## 2017-12-03 DIAGNOSIS — M1712 Unilateral primary osteoarthritis, left knee: Secondary | ICD-10-CM

## 2017-12-03 MED ORDER — METHYLPREDNISOLONE ACETATE 80 MG/ML IJ SUSP
80.0000 mg | Freq: Once | INTRAMUSCULAR | Status: AC
  Administered 2017-12-03: 80 mg via INTRAMUSCULAR

## 2017-12-03 NOTE — Progress Notes (Signed)
PT is aware.

## 2017-12-03 NOTE — Progress Notes (Signed)
IDA resolved! Just continue multivitamin daily. We will check again at her next visit.

## 2017-12-03 NOTE — Progress Notes (Signed)
BP 132/83   Pulse 72   Temp 98.8 F (37.1 C) (Oral)   Ht 5\' 2"  (1.575 m)   Wt 235 lb (106.6 kg)   BMI 42.98 kg/m    Subjective:    Patient ID: Shelly [name], female    DOB: 01/14/1941, 77 y.o.   MRN: [id number]  HPI: Shelly [name] is a 77 y.o. female presenting on 12/03/2017 for Left knee pain (would like knee injection)   HPI Left knee osteoarthritis and pain   Relevant past medical, surgical, family and social history reviewed and updated as indicated. Interim medical history since our last visit reviewed. Allergies and medications reviewed and updated.  Review of Systems  Constitutional: Negative for chills and fever.  Eyes: Negative for visual disturbance.  Respiratory: Negative for chest tightness and shortness of breath.   Cardiovascular: Negative for chest pain and leg swelling.  Musculoskeletal: Positive for arthralgias. Negative for back pain and gait problem.  Skin: Negative for rash.  Neurological: Negative for light-headedness and headaches.  Psychiatric/Behavioral: Negative for agitation and behavioral problems.  All other systems reviewed and are negative.   Per HPI unless specifically indicated above   Allergies as of 12/03/2017      Reactions   Nsaids Other (See Comments)   AVOID all NSAID due to history of GI bleeding      Medication List        Accurate as of 12/03/17  2:41 PM. Always use your most recent med list.          amLODipine 5 MG tablet Commonly known as:  NORVASC Take 1 tablet (5 mg total) by mouth daily.   atorvastatin 40 MG tablet Commonly known as:  LIPITOR TAKE ONE TABLET BY MOUTH DAILY AT 6 PM.   b complex vitamins capsule Take 1 capsule by mouth daily.   cetirizine 10 MG tablet Commonly known as:  ZYRTEC Take 1 tablet (10 mg total) by mouth as needed.   clopidogrel 75 MG tablet Commonly known as:  PLAVIX Take 1 tablet (75 mg total) by mouth daily.   clotrimazole-betamethasone cream Commonly known as:   LOTRISONE APPLY TO THE AFFECTED AREA TWICE DAILY.   dexlansoprazole 60 MG capsule Commonly known as:  DEXILANT Take 1 capsule (60 mg total) by mouth daily.   HYDROcodone-acetaminophen 5-325 MG tablet Commonly known as:  NORCO/VICODIN TAKE ONE TABLET BY MOUTH EVERY 4 TO 6 HOURS AS NEEDED FOR PAIN   lisinopril 40 MG tablet Commonly known as:  PRINIVIL,ZESTRIL Take 1 tablet (40 mg total) daily by mouth.   metoprolol succinate 25 MG 24 hr tablet Commonly known as:  TOPROL XL Take 1 tablet (25 mg total) by mouth daily.   multivitamin tablet Take 1 tablet by mouth daily.   neomycin-polymyxin-hydrocortisone 3.5-10000-1 OTIC suspension Commonly known as:  CORTISPORIN Place 3 drops into both ears 3 (three) times daily.   nitroGLYCERIN 0.4 MG SL tablet Commonly known as:  NITROSTAT Place 1 tablet (0.4 mg total) under the tongue every 5 (five) minutes as needed for chest pain.   psyllium 58.6 % powder Commonly known as:  METAMUCIL Take 1 packet by mouth as needed.          Objective:    BP 132/83   Pulse 72   Temp 98.8 F (37.1 C) (Oral)   Ht 5\' 2"  (1.575 m)   Wt 235 lb (106.6 kg)   BMI 42.98 kg/m   Wt Readings from Last 3 Encounters:  12/03/17 235  lb (106.6 kg)  11/27/17 237 lb (107.5 kg)  11/25/17 237 lb (107.5 kg)    Physical Exam  Constitutional: She is oriented to person, place, and time. She appears well-developed and well-nourished. No distress.  Eyes: Pupils are equal, round, and reactive to light. Conjunctivae and EOM are normal.  Musculoskeletal: Normal range of motion. She exhibits no edema.       Left knee: She exhibits effusion. She exhibits normal range of motion, no deformity, no LCL laxity, normal patellar mobility, normal meniscus and no MCL laxity. Tenderness found. Medial joint line and lateral joint line tenderness noted.  Neurological: She is alert and oriented to person, place, and time. Coordination normal.  Skin: Skin is warm and dry. No rash  noted. She is not diaphoretic.  Psychiatric: She has a normal mood and affect. Her behavior is normal.  Nursing note and vitals reviewed.   Knee injection: Consent form signed. Risk factors of bleeding and infection discussed with patient and patient is agreeable towards injection. Patient prepped with Betadine. Lateral approach towards injection used. Injected 80 mg of Depo-Medrol and 1 mL of 2% lidocaine. Patient tolerated procedure well and no side effects from noted. Minimal to no bleeding. Simple bandage applied after.     Assessment & Plan:   Problem List Items Addressed This Visit      Musculoskeletal and Integument   Osteoarthritis left knee - Primary   Relevant Medications   methylPREDNISolone acetate (DEPO-MEDROL) injection 80 mg (Start on 12/03/2017  4:00 PM)       Follow up plan: Return if symptoms worsen or fail to improve.  Counseling provided for all of the vaccine components No orders of the defined types were placed in this encounter.   Caryl Pina, MD [facility] 12/03/2017, 2:41 PM

## 2017-12-04 ENCOUNTER — Ambulatory Visit (INDEPENDENT_AMBULATORY_CARE_PROVIDER_SITE_OTHER): Payer: Medicare Other | Admitting: Family Medicine

## 2017-12-04 ENCOUNTER — Encounter: Payer: Self-pay | Admitting: Family Medicine

## 2017-12-04 VITALS — BP 138/69 | HR 85 | Temp 97.6°F | Ht 62.0 in | Wt 236.0 lb

## 2017-12-04 DIAGNOSIS — Z79891 Long term (current) use of opiate analgesic: Secondary | ICD-10-CM | POA: Diagnosis not present

## 2017-12-04 DIAGNOSIS — R52 Pain, unspecified: Secondary | ICD-10-CM

## 2017-12-04 DIAGNOSIS — M1712 Unilateral primary osteoarthritis, left knee: Secondary | ICD-10-CM | POA: Diagnosis not present

## 2017-12-04 DIAGNOSIS — F1129 Opioid dependence with unspecified opioid-induced disorder: Secondary | ICD-10-CM | POA: Diagnosis not present

## 2017-12-04 MED ORDER — HYDROCODONE-ACETAMINOPHEN 5-325 MG PO TABS: 1.0000 | ORAL_TABLET | Freq: Every day | ORAL | 0 refills | Status: DC | PRN

## 2017-12-04 MED ORDER — HYDROCODONE-ACETAMINOPHEN 5-325 MG PO TABS: ORAL_TABLET | ORAL | 0 refills | Status: DC

## 2017-12-04 NOTE — Progress Notes (Signed)
[place] Controlled Substance Abuse database reviewed- Yes If yes- were their any concerning findings : no Depression screen [facility] 2/9 12/04/2017 12/04/2017 12/03/2017 11/27/2017 09/07/2017  Decreased Interest 0 0 0 0 0  Down, Depressed, Hopeless 0 0 0 0 0  PHQ - 2 Score 0 0 0 0 0    No flowsheet data found.  [employer] drug screen performed- Yes  SOAPP  0= never  1= seldom  2=sometimes  3= often  4= very often  How often do you have mood swings? 0 How often do you smoke a cigarette within an hour after waling up? 0 How often have you taken medication other than the way that it was prescribed?0 How often have you used illegal drugs in the past 5 years? 0 How often, in your lifetime, have you had legal problems or been arrested? 0  Score 0  Alcohol Audit - How often during the last year have found that you: 0-Never   1- Less than monthly   2- Monthly     3-Weekly     4-daily or almost daily  - found that you were not able to stop drinking once you started- 0 -failed to do what was normally expected of you because of drinking- 0 -needed a first drink in the morning- 0 -had a feeling of guilt or remorse after drinking- 0 -are/were unable to remember what happened the night before because of your drinking- 0  0- NO   2- yes but not in last year  4- yes during last year -Have you or someone else been injured because of your drinking- 0 - Has anyone been concerned about your drinking or suggested you cut down- 0        TOTAL- 0   ( 0-7- alcohol education, 8-15- simple advice, 16-19 simple advice plus counseling, 20-40 referral for evaluation and treatment 0   Designated Pharmacy- [facility]  Pain assessment: Cause of pain- chronic knee pain, OA Pain location- left knee Pain on scale of 1-10- 7 Frequency- all the time What increases pain-there all time What makes pain Better-injections and pain meds Effects on ADL - affects ability to grocery shop and  walk  Prior treatments tried and failed- injections Current treatments- norco and injection Morphine mg equivalent- 5mg   Pain management agreement reviewed and signed- Yes

## 2017-12-07 ENCOUNTER — Ambulatory Visit (INDEPENDENT_AMBULATORY_CARE_PROVIDER_SITE_OTHER): Payer: Medicare Other | Admitting: *Deleted

## 2017-12-07 DIAGNOSIS — I10 Essential (primary) hypertension: Secondary | ICD-10-CM | POA: Diagnosis not present

## 2017-12-07 NOTE — Progress Notes (Signed)
BP is fine, no changes   Zandra Abts MD

## 2017-12-07 NOTE — Progress Notes (Signed)
Pt aware.

## 2017-12-07 NOTE — Progress Notes (Signed)
Pt here for 2 week BP check - says she has been compliant with decrease of amlodipine and increase of Toprol - says swelling in her ankles and feet have been better - still has some visible swelling more in the left leg than the right leg. Denies any symptoms at this time. Says she has been under stress since daughter was admitted to hospital over the weekend. BP today is 130/72 HR 79

## 2017-12-09 LAB — TOXASSURE SELECT 13 (MW), URINE

## 2017-12-10 ENCOUNTER — Other Ambulatory Visit: Payer: Self-pay | Admitting: Gastroenterology

## 2017-12-22 ENCOUNTER — Ambulatory Visit: Payer: Medicare Other | Admitting: Family Medicine

## 2017-12-22 ENCOUNTER — Telehealth: Payer: Self-pay | Admitting: Family Medicine

## 2017-12-23 DIAGNOSIS — M25562 Pain in left knee: Secondary | ICD-10-CM | POA: Diagnosis not present

## 2017-12-23 DIAGNOSIS — Z723 Lack of physical exercise: Secondary | ICD-10-CM | POA: Diagnosis not present

## 2017-12-23 DIAGNOSIS — R262 Difficulty in walking, not elsewhere classified: Secondary | ICD-10-CM | POA: Diagnosis not present

## 2017-12-23 DIAGNOSIS — M6281 Muscle weakness (generalized): Secondary | ICD-10-CM | POA: Diagnosis not present

## 2017-12-23 DIAGNOSIS — M1712 Unilateral primary osteoarthritis, left knee: Secondary | ICD-10-CM | POA: Diagnosis not present

## 2017-12-25 DIAGNOSIS — M25562 Pain in left knee: Secondary | ICD-10-CM | POA: Diagnosis not present

## 2017-12-25 DIAGNOSIS — Z723 Lack of physical exercise: Secondary | ICD-10-CM | POA: Diagnosis not present

## 2017-12-25 DIAGNOSIS — M1712 Unilateral primary osteoarthritis, left knee: Secondary | ICD-10-CM | POA: Diagnosis not present

## 2017-12-25 DIAGNOSIS — M6281 Muscle weakness (generalized): Secondary | ICD-10-CM | POA: Diagnosis not present

## 2017-12-25 DIAGNOSIS — R262 Difficulty in walking, not elsewhere classified: Secondary | ICD-10-CM | POA: Diagnosis not present

## 2018-01-01 DIAGNOSIS — Z723 Lack of physical exercise: Secondary | ICD-10-CM | POA: Diagnosis not present

## 2018-01-01 DIAGNOSIS — M25562 Pain in left knee: Secondary | ICD-10-CM | POA: Diagnosis not present

## 2018-01-01 DIAGNOSIS — M6281 Muscle weakness (generalized): Secondary | ICD-10-CM | POA: Diagnosis not present

## 2018-01-01 DIAGNOSIS — R262 Difficulty in walking, not elsewhere classified: Secondary | ICD-10-CM | POA: Diagnosis not present

## 2018-01-01 DIAGNOSIS — M1712 Unilateral primary osteoarthritis, left knee: Secondary | ICD-10-CM | POA: Diagnosis not present

## 2018-01-02 ENCOUNTER — Other Ambulatory Visit: Payer: Self-pay | Admitting: Cardiovascular Disease

## 2018-01-06 DIAGNOSIS — M25562 Pain in left knee: Secondary | ICD-10-CM | POA: Diagnosis not present

## 2018-01-06 DIAGNOSIS — M6281 Muscle weakness (generalized): Secondary | ICD-10-CM | POA: Diagnosis not present

## 2018-01-06 DIAGNOSIS — Z723 Lack of physical exercise: Secondary | ICD-10-CM | POA: Diagnosis not present

## 2018-01-06 DIAGNOSIS — R262 Difficulty in walking, not elsewhere classified: Secondary | ICD-10-CM | POA: Diagnosis not present

## 2018-01-06 DIAGNOSIS — M1712 Unilateral primary osteoarthritis, left knee: Secondary | ICD-10-CM | POA: Diagnosis not present

## 2018-01-13 DIAGNOSIS — M1712 Unilateral primary osteoarthritis, left knee: Secondary | ICD-10-CM | POA: Diagnosis not present

## 2018-01-13 DIAGNOSIS — Z723 Lack of physical exercise: Secondary | ICD-10-CM | POA: Diagnosis not present

## 2018-01-13 DIAGNOSIS — M25562 Pain in left knee: Secondary | ICD-10-CM | POA: Diagnosis not present

## 2018-01-13 DIAGNOSIS — R262 Difficulty in walking, not elsewhere classified: Secondary | ICD-10-CM | POA: Diagnosis not present

## 2018-01-13 DIAGNOSIS — M6281 Muscle weakness (generalized): Secondary | ICD-10-CM | POA: Diagnosis not present

## 2018-01-14 ENCOUNTER — Telehealth: Payer: Self-pay | Admitting: Family Medicine

## 2018-01-14 ENCOUNTER — Ambulatory Visit: Payer: Medicare Other | Admitting: Family Medicine

## 2018-01-15 ENCOUNTER — Other Ambulatory Visit: Payer: Self-pay | Admitting: Gastroenterology

## 2018-01-19 NOTE — Telephone Encounter (Signed)
Attempted to contact patient - NA °

## 2018-01-20 ENCOUNTER — Telehealth: Payer: Self-pay | Admitting: Gastroenterology

## 2018-01-20 ENCOUNTER — Other Ambulatory Visit: Payer: Self-pay | Admitting: Family Medicine

## 2018-01-20 MED ORDER — CLOPIDOGREL BISULFATE 75 MG PO TABS: 75.0000 mg | ORAL_TABLET | Freq: Every day | ORAL | 0 refills | Status: DC

## 2018-01-20 NOTE — Telephone Encounter (Signed)
Amy said they sent refill request here for Plavix and got fax back that we do not prescribe Plavix. I noted that Dr. Oneida Alar did prescribe this so I told Amy I will forward request to Dr. Oneida Alar.

## 2018-01-20 NOTE — Telephone Encounter (Signed)
I called and informed Amy at the pharmacy and also informed the pt.

## 2018-01-20 NOTE — Telephone Encounter (Signed)
Amy at [facility] has a question about patient's prescription of Plavix 75 mg. Please call her at [phone]

## 2018-01-20 NOTE — Telephone Encounter (Signed)
Patient has a follow up appointment scheduled. 

## 2018-01-20 NOTE — Telephone Encounter (Signed)
PLEASE CALL PT'S PHARAMCY. SHE SHOULD HAVE REFILLS AUTHORIZED BY [name]. I SENT A ONE TIME REFILL FOR 30 DAYS UNTIL PAPERWORK CAN BE COMPLETE.

## 2018-01-21 ENCOUNTER — Ambulatory Visit (INDEPENDENT_AMBULATORY_CARE_PROVIDER_SITE_OTHER): Payer: Medicare Other | Admitting: Family Medicine

## 2018-01-21 ENCOUNTER — Encounter: Payer: Self-pay | Admitting: Family Medicine

## 2018-01-21 VITALS — BP 130/71 | HR 79 | Temp 97.8°F | Ht 62.0 in | Wt 238.6 lb

## 2018-01-21 DIAGNOSIS — K219 Gastro-esophageal reflux disease without esophagitis: Secondary | ICD-10-CM

## 2018-01-21 MED ORDER — ATORVASTATIN CALCIUM 40 MG PO TABS: ORAL_TABLET | ORAL | 1 refills | Status: DC

## 2018-01-21 MED ORDER — FLUTICASONE PROPIONATE 50 MCG/ACT NA SUSP: 1.0000 | Freq: Two times a day (BID) | NASAL | 6 refills | Status: DC | PRN

## 2018-01-21 MED ORDER — CLOTRIMAZOLE-BETAMETHASONE 1-0.05 % EX CREA: TOPICAL_CREAM | Freq: Two times a day (BID) | CUTANEOUS | 1 refills | Status: DC

## 2018-01-21 NOTE — Progress Notes (Signed)
BP 130/71   Pulse 79   Temp 97.8 F (36.6 C) (Oral)   Ht 5\' 2"  (1.575 m)   Wt 238 lb 9.6 oz (108.2 kg)   BMI 43.64 kg/m    Subjective:    Patient ID: Shelly [name], female    DOB: 1941/04/02, 77 y.o.   MRN: [id number]  HPI: Shelly [name] is a 77 y.o. female presenting on 01/21/2018 for Nausea (x 1 month on and off); Abdominal Pain (Patient states it is not pain just feels uncomfortable); and Nasal Congestion (x 4-5 days)   HPI Abdominal discomfort and indigestion Nausea and abdominal discomfort and abdominal pains that have been going on for 1 month off and on.  Patient says it is not a severe pain or anything severe but more of a discomfort.  She is also been having some nasal drainage that she thinks is allergies and has been taking something for it to improve it.  Patient denies nausea vomiting or diarrhea  Relevant past medical, surgical, family and social history reviewed and updated as indicated. Interim medical history since our last visit reviewed. Allergies and medications reviewed and updated.  Review of Systems  Constitutional: Negative for chills and fever.  HENT: Positive for congestion. Negative for ear discharge and ear pain.   Eyes: Negative for visual disturbance.  Respiratory: Negative for chest tightness and shortness of breath.   Cardiovascular: Negative for chest pain and leg swelling.  Gastrointestinal: Positive for abdominal pain and nausea. Negative for constipation, diarrhea and vomiting.  Musculoskeletal: Negative for back pain and gait problem.  Skin: Negative for rash.  Neurological: Negative for light-headedness and headaches.  Psychiatric/Behavioral: Negative for agitation and behavioral problems.  All other systems reviewed and are negative.   Per HPI unless specifically indicated above   Allergies as of 01/21/2018      Reactions   Nsaids Other (See Comments)   AVOID all NSAID due to history of GI bleeding      Medication List       Accurate as of 01/21/18  9:34 AM. Always use your most recent med list.          amLODipine 5 MG tablet Commonly known as:  NORVASC Take 1 tablet (5 mg total) by mouth daily.   atorvastatin 40 MG tablet Commonly known as:  LIPITOR TAKE ONE TABLET BY MOUTH DAILY AT 6 PM.   b complex vitamins capsule Take 1 capsule by mouth daily.   cetirizine 10 MG tablet Commonly known as:  ZYRTEC Take 1 tablet (10 mg total) by mouth as needed.   clopidogrel 75 MG tablet Commonly known as:  PLAVIX Take 1 tablet (75 mg total) by mouth daily.   clotrimazole-betamethasone cream Commonly known as:  LOTRISONE Apply topically 2 (two) times daily. to affected area   dexlansoprazole 60 MG capsule Commonly known as:  DEXILANT Take 1 capsule (60 mg total) by mouth daily.   fluticasone 50 MCG/ACT nasal spray Commonly known as:  FLONASE Place 1 spray into both nostrils 2 (two) times daily as needed for allergies or rhinitis.   HYDROcodone-acetaminophen 5-325 MG tablet Commonly known as:  NORCO/VICODIN TAKE ONE TABLET BY MOUTH EVERY 4 TO 6 HOURS AS NEEDED FOR PAIN   HYDROcodone-acetaminophen 5-325 MG tablet Commonly known as:  NORCO/VICODIN Take 1 tablet by mouth daily as needed for moderate pain. Do not refill for 30 days from prescription date   HYDROcodone-acetaminophen 5-325 MG tablet Commonly known as:  NORCO/VICODIN Take 1 tablet  by mouth daily as needed for moderate pain.   lisinopril 40 MG tablet Commonly known as:  PRINIVIL,ZESTRIL TAKE ONE TABLET BY MOUTH DAILY.   metoprolol succinate 25 MG 24 hr tablet Commonly known as:  TOPROL-XL Take 1 tablet (25 mg total) by mouth daily.   multivitamin tablet Take 1 tablet by mouth daily.   neomycin-polymyxin-hydrocortisone 3.5-10000-1 OTIC suspension Commonly known as:  CORTISPORIN Place 3 drops into both ears 3 (three) times daily.   nitroGLYCERIN 0.4 MG SL tablet Commonly known as:  NITROSTAT Place 1 tablet (0.4 mg total) under  the tongue every 5 (five) minutes as needed for chest pain.   PROCTO-MED HC 2.5 % rectal cream Generic drug:  hydrocortisone APPLY ONE APPLICATION RECTALLY TWICE DAILY.   psyllium 58.6 % powder Commonly known as:  METAMUCIL Take 1 packet by mouth as needed.          Objective:    BP 130/71   Pulse 79   Temp 97.8 F (36.6 C) (Oral)   Ht 5\' 2"  (1.575 m)   Wt 238 lb 9.6 oz (108.2 kg)   BMI 43.64 kg/m   Wt Readings from Last 3 Encounters:  01/21/18 238 lb 9.6 oz (108.2 kg)  12/04/17 236 lb (107 kg)  12/03/17 235 lb (106.6 kg)    Physical Exam  Constitutional: She is oriented to person, place, and time. She appears well-developed and well-nourished. No distress.  Eyes: Conjunctivae are normal.  Cardiovascular: Normal rate, regular rhythm, normal heart sounds and intact distal pulses.  No murmur heard. Pulmonary/Chest: Effort normal and breath sounds normal. No respiratory distress. She has no wheezes.  Abdominal: Soft. Normal appearance. There is no tenderness. There is no rigidity, no rebound, no guarding and no CVA tenderness.  Musculoskeletal: Normal range of motion. She exhibits no edema or tenderness.  Neurological: She is alert and oriented to person, place, and time. Coordination normal.  Skin: Skin is warm and dry. No rash noted. She is not diaphoretic.  Psychiatric: She has a normal mood and affect. Her behavior is normal.  Nursing note and vitals reviewed.       Assessment & Plan:   Problem List Items Addressed This Visit      Digestive   GERD (gastroesophageal reflux disease) - Primary   Relevant Medications   fluticasone (FLONASE) 50 MCG/ACT nasal spray      Patient is likely having GERD, recommended over-the-counter Zantac Follow up plan: Return if symptoms worsen or fail to improve.  Counseling provided for all of the vaccine components No orders of the defined types were placed in this encounter.   Caryl Pina, MD [facility] 01/21/2018, 9:34 AM

## 2018-01-25 ENCOUNTER — Encounter: Payer: Self-pay | Admitting: Cardiovascular Disease

## 2018-01-25 ENCOUNTER — Ambulatory Visit: Payer: Medicare Other | Admitting: Cardiovascular Disease

## 2018-01-25 VITALS — BP 132/60 | HR 87 | Ht 64.0 in | Wt 237.0 lb

## 2018-01-25 DIAGNOSIS — I25118 Atherosclerotic heart disease of native coronary artery with other forms of angina pectoris: Secondary | ICD-10-CM

## 2018-01-25 DIAGNOSIS — I10 Essential (primary) hypertension: Secondary | ICD-10-CM | POA: Diagnosis not present

## 2018-01-25 DIAGNOSIS — E785 Hyperlipidemia, unspecified: Secondary | ICD-10-CM

## 2018-01-25 DIAGNOSIS — Z955 Presence of coronary angioplasty implant and graft: Secondary | ICD-10-CM

## 2018-01-25 DIAGNOSIS — I252 Old myocardial infarction: Secondary | ICD-10-CM

## 2018-01-25 NOTE — Progress Notes (Signed)
SUBJECTIVE: The patient presents for routine follow-up.  She did not tolerate 10 mg of amlodipine as she developed lower extremity edema and the dose was reduced to 5 mg.  She denies exertional chest pain.  She has left knee pain and receives physical therapy.  She does not want to undergo left knee replacement surgery both due to financial reasons as well as her fear of the surgery and postoperative recovery.  She has some mild bilateral ankle swelling.  She denies orthopnea paroxysmal nocturnal dyspnea.   Review of Systems: As per "subjective", otherwise negative.  Allergies  Allergen Reactions  . Nsaids Other (See Comments)    AVOID all NSAID due to history of GI bleeding    Current Outpatient Medications  Medication Sig Dispense Refill  . amLODipine (NORVASC) 5 MG tablet Take 1 tablet (5 mg total) by mouth daily. 180 tablet 3  . atorvastatin (LIPITOR) 40 MG tablet TAKE ONE TABLET BY MOUTH DAILY AT 6 PM. 90 tablet 1  . b complex vitamins capsule Take 1 capsule by mouth daily.    . cetirizine (ZYRTEC) 10 MG tablet Take 1 tablet (10 mg total) by mouth as needed. 90 tablet 0  . clopidogrel (PLAVIX) 75 MG tablet Take 1 tablet (75 mg total) by mouth daily. 30 tablet 0  . clotrimazole-betamethasone (LOTRISONE) cream Apply topically 2 (two) times daily. to affected area 45 g 1  . dexlansoprazole (DEXILANT) 60 MG capsule Take 1 capsule (60 mg total) by mouth daily. 90 capsule 3  . fluticasone (FLONASE) 50 MCG/ACT nasal spray Place 1 spray into both nostrils 2 (two) times daily as needed for allergies or rhinitis. 16 g 6  . HYDROcodone-acetaminophen (NORCO/VICODIN) 5-325 MG tablet TAKE ONE TABLET BY MOUTH EVERY 4 TO 6 HOURS AS NEEDED FOR PAIN 30 tablet 0  . lisinopril (PRINIVIL,ZESTRIL) 40 MG tablet TAKE ONE TABLET BY MOUTH DAILY. 90 tablet 0  . metoprolol succinate (TOPROL XL) 25 MG 24 hr tablet Take 1 tablet (25 mg total) by mouth daily. 30 tablet 11  . Multiple Vitamin  (MULTIVITAMIN) tablet Take 1 tablet by mouth daily.    Marland Kitchen neomycin-polymyxin-hydrocortisone (CORTISPORIN) 3.5-10000-1 OTIC suspension Place 3 drops into both ears 3 (three) times daily. (Patient taking differently: Place 3 drops into both ears as needed. ) 10 mL 6  . nitroGLYCERIN (NITROSTAT) 0.4 MG SL tablet Place 1 tablet (0.4 mg total) under the tongue every 5 (five) minutes as needed for chest pain. 25 tablet 3  . PROCTO-MED HC 2.5 % rectal cream APPLY ONE APPLICATION RECTALLY TWICE DAILY. 30 g 1  . psyllium (METAMUCIL) 58.6 % powder Take 1 packet by mouth as needed.      No current facility-administered medications for this visit.     Past Medical History:  Diagnosis Date  . Allergy   . Anemia    GI bleed  . Anginal pain ([place])   . Anxiety   . Arthritis   . Blood transfusion without reported diagnosis   . Cataract   . Chronic kidney disease   . Coronary artery disease    SEVERE  . GERD (gastroesophageal reflux disease)   . GI bleed 12/2013  . Goiter   . Hyperlipidemia   . Hypertension   . Myocardial infarct ([place])   . Myocardial infarction ([place]) 08/2013  . Neuralgia of right lower extremity 07/13/2017  . Pancolonic diverticulosis 12/2013    Past Surgical History:  Procedure Laterality Date  . COLONOSCOPY N/A 01/13/2014  Dr. Hung:pandiverticulosis   . COLONOSCOPY N/A 12/14/2015   Dr. Michail Sermon: pancolonic diverticulosis, internal hemorrhoids   . CORNEAL TRANSPLANT    . CORONARY ANGIOPLASTY  08/2013  . ESOPHAGOGASTRODUODENOSCOPY N/A 12/14/2015   Dr. Michail Sermon: normal   . GIVENS CAPSULE STUDY N/A 08/13/2016   Dr. Oneida Alar: enteritis due to aspirin.   Marland Kitchen HEEL SPUR EXCISION    . LEFT HEART CATHETERIZATION WITH CORONARY ANGIOGRAM N/A 08/23/2013   Procedure: LEFT HEART CATHETERIZATION WITH CORONARY ANGIOGRAM;  Surgeon: Peter M Martinique, MD;  Location: [facility] CATH LAB;  Service: Cardiovascular;  Laterality: N/A;  . PERCUTANEOUS CORONARY STENT INTERVENTION (PCI-S) N/A 08/24/2013   Procedure:  PERCUTANEOUS CORONARY STENT INTERVENTION (PCI-S);  Surgeon: Peter M Martinique, MD;  Location: [facility] CATH LAB;  Service: Cardiovascular;  Laterality: N/A;  . TONSILLECTOMY    . TUBAL LIGATION      Social History   Socioeconomic History  . Marital status: Widowed    Spouse name: Not on file  . Number of children: 8  . Years of education: 21  . Highest education level: Not on file  Occupational History  . 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 Financial resource strain: Not on file  . Food insecurity:    Worry: Not on file    Inability: Not on file  . Transportation needs:    Medical: Not on file    Non-medical: Not on file  Tobacco Use  . Smoking status: Never Smoker  . Smokeless tobacco: Never Used  Substance and Sexual Activity  . Alcohol use: No    Alcohol/week: 0.0 standard drinks  . Drug use: No  . Sexual activity: Not Currently    Comment: divorced, lives with daughter and granddaughters  Lifestyle  . Physical activity:    Days per week: Not on file    Minutes per session: Not on file  . Stress: Not on file  Relationships  . Social connections:    Talks on phone: Not on file    Gets together: Not on file    Attends religious service: Not on file    Active member of club or organization: Not on file    Attends meetings of clubs or organizations: Not on file    Relationship status: Not on file  . Intimate partner violence:    Fear of current or ex partner: Not on file    Emotionally abused: Not on file    Physically abused: Not on file    Forced sexual activity: Not on file  Other Topics Concern  . Not on file  Social History Narrative   Works in a group home   Full time   Lives with daughter   Two grand daughters    One son home most of the time     Vitals:   01/25/18 0843  BP: 132/60  Pulse: 87  SpO2: 98%  Weight: 237 lb (107.5 kg)  Height: 5\' 4"  (1.626 m)    Wt Readings from Last 3 Encounters:  01/25/18 237 lb (107.5  kg)  01/21/18 238 lb 9.6 oz (108.2 kg)  12/04/17 236 lb (107 kg)     PHYSICAL EXAM General: NAD HEENT: Normal. Neck: No JVD, no thyromegaly. Lungs: Clear to auscultation bilaterally with normal respiratory effort. CV: Regular rate and rhythm, normal S1/S2, no S3/S4, no murmur.  Mild bilateral nonpitting periankle edema.  No carotid bruit.   Abdomen: Soft, nontender, no distention.  Neurologic: Alert and oriented.  Psych:  Normal affect. Skin: Normal. Musculoskeletal: No gross deformities.    ECG: Reviewed above under Subjective   Labs: Lab Results  Component Value Date/Time   K 4.7 11/27/2017 10:45 AM   BUN 15 11/27/2017 10:45 AM   CREATININE 1.03 (H) 11/27/2017 10:45 AM   CREATININE 1.23 (H) 11/20/2016 08:54 AM   ALT 16 11/27/2017 10:45 AM   TSH 0.324 (L) 12/09/2015 08:33 AM   TSH 0.292 (L) 10/19/2013 10:24 AM   HGB 11.9 11/27/2017 11:23 AM     Lipids: Lab Results  Component Value Date/Time   LDLCALC 89 11/27/2017 10:45 AM   CHOL 161 11/27/2017 10:45 AM   TRIG 85 11/27/2017 10:45 AM   HDL 55 11/27/2017 10:45 AM       ASSESSMENT AND PLAN:  1. CAD s/p NSTEMI with overlapping stents in the proximal to mid RCA on 08/24/13: Stable ischemic heart disease. She has residual moderate CAD but denies recurrent anginal symptoms. Continue ASA, lisinopril, Lipitor, and Toprol-XL.  2. Hypertension:Blood pressure is controlled.  No changes to therapy.  She did not tolerate 10 mg of amlodipine as it led to lower extremity edema.  3. Hyperlipidemia:Lipids previously reviewed. Continue Lipitor 40 mg.  4.  Obesity: I previously provided her with education on weight loss.  Ability to exercise is limited by left knee pain.   Disposition: Follow up 6 months   [name], M.D., F.A.C.C.

## 2018-01-25 NOTE — Patient Instructions (Signed)

## 2018-01-26 ENCOUNTER — Telehealth: Payer: Self-pay | Admitting: *Deleted

## 2018-01-26 NOTE — Telephone Encounter (Signed)
Noted  

## 2018-01-26 NOTE — Telephone Encounter (Signed)
That is fine 

## 2018-01-26 NOTE — Telephone Encounter (Signed)
Patient just seen 01/25/2018 - follow up to question on her medication list.  Stated that she could no longer take ASA due to GI bleeds.  ASA was stopped & patient placed back on her Plavix.  She wishes to continue with this regiment.

## 2018-01-29 DIAGNOSIS — M6281 Muscle weakness (generalized): Secondary | ICD-10-CM | POA: Diagnosis not present

## 2018-01-29 DIAGNOSIS — Z723 Lack of physical exercise: Secondary | ICD-10-CM | POA: Diagnosis not present

## 2018-01-29 DIAGNOSIS — R262 Difficulty in walking, not elsewhere classified: Secondary | ICD-10-CM | POA: Diagnosis not present

## 2018-01-29 DIAGNOSIS — R269 Unspecified abnormalities of gait and mobility: Secondary | ICD-10-CM | POA: Diagnosis not present

## 2018-01-29 DIAGNOSIS — M25562 Pain in left knee: Secondary | ICD-10-CM | POA: Diagnosis not present

## 2018-01-29 DIAGNOSIS — M1712 Unilateral primary osteoarthritis, left knee: Secondary | ICD-10-CM | POA: Diagnosis not present

## 2018-02-04 DIAGNOSIS — M6281 Muscle weakness (generalized): Secondary | ICD-10-CM | POA: Diagnosis not present

## 2018-02-04 DIAGNOSIS — M1712 Unilateral primary osteoarthritis, left knee: Secondary | ICD-10-CM | POA: Diagnosis not present

## 2018-02-04 DIAGNOSIS — Z723 Lack of physical exercise: Secondary | ICD-10-CM | POA: Diagnosis not present

## 2018-02-04 DIAGNOSIS — R269 Unspecified abnormalities of gait and mobility: Secondary | ICD-10-CM | POA: Diagnosis not present

## 2018-02-04 DIAGNOSIS — R262 Difficulty in walking, not elsewhere classified: Secondary | ICD-10-CM | POA: Diagnosis not present

## 2018-02-04 DIAGNOSIS — M25562 Pain in left knee: Secondary | ICD-10-CM | POA: Diagnosis not present

## 2018-02-10 DIAGNOSIS — M25562 Pain in left knee: Secondary | ICD-10-CM | POA: Diagnosis not present

## 2018-02-10 DIAGNOSIS — Z723 Lack of physical exercise: Secondary | ICD-10-CM | POA: Diagnosis not present

## 2018-02-10 DIAGNOSIS — R262 Difficulty in walking, not elsewhere classified: Secondary | ICD-10-CM | POA: Diagnosis not present

## 2018-02-10 DIAGNOSIS — M6281 Muscle weakness (generalized): Secondary | ICD-10-CM | POA: Diagnosis not present

## 2018-02-10 DIAGNOSIS — R269 Unspecified abnormalities of gait and mobility: Secondary | ICD-10-CM | POA: Diagnosis not present

## 2018-02-10 DIAGNOSIS — M1712 Unilateral primary osteoarthritis, left knee: Secondary | ICD-10-CM | POA: Diagnosis not present

## 2018-02-15 DIAGNOSIS — R269 Unspecified abnormalities of gait and mobility: Secondary | ICD-10-CM | POA: Diagnosis not present

## 2018-02-15 DIAGNOSIS — M25562 Pain in left knee: Secondary | ICD-10-CM | POA: Diagnosis not present

## 2018-02-15 DIAGNOSIS — M1712 Unilateral primary osteoarthritis, left knee: Secondary | ICD-10-CM | POA: Diagnosis not present

## 2018-02-15 DIAGNOSIS — Z723 Lack of physical exercise: Secondary | ICD-10-CM | POA: Diagnosis not present

## 2018-02-15 DIAGNOSIS — R262 Difficulty in walking, not elsewhere classified: Secondary | ICD-10-CM | POA: Diagnosis not present

## 2018-02-15 DIAGNOSIS — M6281 Muscle weakness (generalized): Secondary | ICD-10-CM | POA: Diagnosis not present

## 2018-02-19 DIAGNOSIS — M6281 Muscle weakness (generalized): Secondary | ICD-10-CM | POA: Diagnosis not present

## 2018-02-19 DIAGNOSIS — M1712 Unilateral primary osteoarthritis, left knee: Secondary | ICD-10-CM | POA: Diagnosis not present

## 2018-02-19 DIAGNOSIS — R262 Difficulty in walking, not elsewhere classified: Secondary | ICD-10-CM | POA: Diagnosis not present

## 2018-02-19 DIAGNOSIS — Z723 Lack of physical exercise: Secondary | ICD-10-CM | POA: Diagnosis not present

## 2018-02-19 DIAGNOSIS — M25562 Pain in left knee: Secondary | ICD-10-CM | POA: Diagnosis not present

## 2018-03-03 ENCOUNTER — Encounter: Payer: Self-pay | Admitting: Gastroenterology

## 2018-03-03 ENCOUNTER — Ambulatory Visit: Payer: Medicare Other | Admitting: Gastroenterology

## 2018-03-03 VITALS — BP 151/76 | HR 84 | Temp 97.2°F | Ht 64.0 in | Wt 239.2 lb

## 2018-03-03 DIAGNOSIS — R110 Nausea: Secondary | ICD-10-CM

## 2018-03-03 DIAGNOSIS — K219 Gastro-esophageal reflux disease without esophagitis: Secondary | ICD-10-CM

## 2018-03-03 MED ORDER — HYDROCORTISONE 2.5 % RE CREA: TOPICAL_CREAM | Freq: Two times a day (BID) | RECTAL | 1 refills | Status: DC

## 2018-03-03 NOTE — Assessment & Plan Note (Signed)
77 year old female taking Dexilant once daily. Nausea in the morning relieved after eating. Discussed avoidance of late-night eating, GERD dietary/behavior modifications. No alarm features. Return in 3 months. Will recheck labs at that time due to history of IDA.

## 2018-03-03 NOTE — Patient Instructions (Signed)
Continue Dexilant once each morning.  As we talked about, avoid eating 3 hours before laying down.  If you have any problems between now and the next time I see you, please call and ask to speak to me or the nurse.  I will be thinking of you as you walk through this season!  I enjoyed seeing you again today! As you know, I value our relationship and want to provide genuine, compassionate, and quality care. I welcome your feedback. If you receive a survey regarding your visit,  I greatly appreciate you taking time to fill this out. See you next time!  Annitta Needs, PhD, ANP-BC [facility]

## 2018-03-03 NOTE — Progress Notes (Signed)
Referring Provider: No ref. provider found Primary Care Physician:  [name], [name], MD Primary GI: Dr. Oneida Alar   Chief Complaint  Patient presents with  . Gastroesophageal Reflux    c/o nausea but no vomiting, slight discomfort    HPI:   Shelly [name] is a 77 y.o. female presenting today with a history of GI bleeding, inpatient in Nov 2017. EGD and colonoscopy July 2017 by Dr. Michail Sermon with normal EGD and colonoscopy showing pancolonic diverticulosis. Presumed diverticular bleed at that time. Nov 2017 readmitted with suspected diverticular bleed in setting of oral diclofenac.Due to IDA, capsule study was completed. This showed enteritis due to aspirin use. History of parenteral iron in July 2018. IDA resolved.   Noticed ankle edema with Norvasc. Going to see Dr. Warrick Parisian soon. Moreso in left leg than right leg. Dexilant daily. Some nausea in the mornings. Eating late at night then laying down. Feels better in the mornings after eating. No dysphagia. No significant abdominal pain. Good appetite. Under significant stress: daughter has cancer, receiving port today. Trying to work out living situation with grandchildren.   Past Medical History:  Diagnosis Date  . Allergy   . Anemia    GI bleed  . Anginal pain ([place])   . Anxiety   . Arthritis   . Blood transfusion without reported diagnosis   . Cataract   . Chronic kidney disease   . Coronary artery disease    SEVERE  . GERD (gastroesophageal reflux disease)   . GI bleed 12/2013  . Goiter   . Hyperlipidemia   . Hypertension   . Myocardial infarct ([place])   . Myocardial infarction ([place]) 08/2013  . Neuralgia of right lower extremity 07/13/2017  . Pancolonic diverticulosis 12/2013    Past Surgical History:  Procedure Laterality Date  . COLONOSCOPY N/A 01/13/2014   Dr. Hung:pandiverticulosis   . COLONOSCOPY N/A 12/14/2015   Dr. Michail Sermon: pancolonic diverticulosis, internal hemorrhoids   . CORNEAL TRANSPLANT    . CORONARY  ANGIOPLASTY  08/2013  . ESOPHAGOGASTRODUODENOSCOPY N/A 12/14/2015   Dr. Michail Sermon: normal   . GIVENS CAPSULE STUDY N/A 08/13/2016   Dr. Oneida Alar: enteritis due to aspirin.   Marland Kitchen HEEL SPUR EXCISION    . LEFT HEART CATHETERIZATION WITH CORONARY ANGIOGRAM N/A 08/23/2013   Procedure: LEFT HEART CATHETERIZATION WITH CORONARY ANGIOGRAM;  Surgeon: Peter M Martinique, MD;  Location: [facility] CATH LAB;  Service: Cardiovascular;  Laterality: N/A;  . PERCUTANEOUS CORONARY STENT INTERVENTION (PCI-S) N/A 08/24/2013   Procedure: PERCUTANEOUS CORONARY STENT INTERVENTION (PCI-S);  Surgeon: Peter M Martinique, MD;  Location: [facility] CATH LAB;  Service: Cardiovascular;  Laterality: N/A;  . TONSILLECTOMY    . TUBAL LIGATION      Current Outpatient Medications  Medication Sig Dispense Refill  . amLODipine (NORVASC) 5 MG tablet Take 1 tablet (5 mg total) by mouth daily. 180 tablet 3  . atorvastatin (LIPITOR) 40 MG tablet TAKE ONE TABLET BY MOUTH DAILY AT 6 PM. 90 tablet 1  . b complex vitamins capsule Take 1 capsule by mouth daily.    . cetirizine (ZYRTEC) 10 MG tablet Take 1 tablet (10 mg total) by mouth as needed. 90 tablet 0  . clopidogrel (PLAVIX) 75 MG tablet Take 1 tablet (75 mg total) by mouth daily. 30 tablet 0  . clotrimazole-betamethasone (LOTRISONE) cream Apply topically 2 (two) times daily. to affected area 45 g 1  . dexlansoprazole (DEXILANT) 60 MG capsule Take 1 capsule (60 mg total) by mouth daily. 90 capsule 3  .  fluticasone (FLONASE) 50 MCG/ACT nasal spray Place 1 spray into both nostrils 2 (two) times daily as needed for allergies or rhinitis. 16 g 6  . HYDROcodone-acetaminophen (NORCO/VICODIN) 5-325 MG tablet TAKE ONE TABLET BY MOUTH EVERY 4 TO 6 HOURS AS NEEDED FOR PAIN 30 tablet 0  . hydrocortisone (PROCTO-MED HC) 2.5 % rectal cream Place rectally 2 (two) times daily. As needed 30 g 1  . lisinopril (PRINIVIL,ZESTRIL) 40 MG tablet TAKE ONE TABLET BY MOUTH DAILY. 90 tablet 0  . metoprolol succinate (TOPROL XL) 25 MG 24 hr  tablet Take 1 tablet (25 mg total) by mouth daily. 30 tablet 11  . Multiple Vitamin (MULTIVITAMIN) tablet Take 1 tablet by mouth daily.    Marland Kitchen neomycin-polymyxin-hydrocortisone (CORTISPORIN) 3.5-10000-1 OTIC suspension Place 3 drops into both ears 3 (three) times daily. (Patient taking differently: Place 3 drops into both ears as needed. ) 10 mL 6  . nitroGLYCERIN (NITROSTAT) 0.4 MG SL tablet Place 1 tablet (0.4 mg total) under the tongue every 5 (five) minutes as needed for chest pain. 25 tablet 3  . psyllium (METAMUCIL) 58.6 % powder Take 1 packet by mouth as needed.      No current facility-administered medications for this visit.     Allergies as of 03/03/2018 - Review Complete 03/03/2018  Allergen Reaction Noted  . Nsaids Other (See Comments) 07/25/2016    Family History  Problem Relation Age of Onset  . Hypertension Brother   . Transient ischemic attack Brother   . Cancer Mother        unsure of type   . COPD Mother   . Asthma Sister   . Stroke Sister   . Stroke Sister   . Early death Brother   . Early death Brother   . Liver cancer Daughter   . Hernia Son        Umbilical  . Pancreatic disease Daughter        pancreatectomy  . Diverticulitis Daughter   . Anemia Daughter   . GER disease Son     Social History   Socioeconomic History  . Marital status: Widowed    Spouse name: Not on file  . Number of children: 8  . Years of education: 10  . Highest education level: Not on file  Occupational History  . 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 Financial resource strain: Not on file  . Food insecurity:    Worry: Not on file    Inability: Not on file  . Transportation needs:    Medical: Not on file    Non-medical: Not on file  Tobacco Use  . Smoking status: Never Smoker  . Smokeless tobacco: Never Used  Substance and Sexual Activity  . Alcohol use: No    Alcohol/week: 0.0 standard drinks  . Drug use: No  . Sexual activity: Not  Currently    Comment: divorced, lives with daughter and granddaughters  Lifestyle  . Physical activity:    Days per week: Not on file    Minutes per session: Not on file  . Stress: Not on file  Relationships  . Social connections:    Talks on phone: Not on file    Gets together: Not on file    Attends religious service: Not on file    Active member of club or organization: Not on file    Attends meetings of clubs or organizations: Not on file    Relationship status: Not on  file  Other Topics Concern  . 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 Denies fatigue, weakness, weight loss.  CV: Denies chest pain, palpitations, syncope, peripheral edema, and claudication. Resp: Denies dyspnea at rest, cough, wheezing, coughing up blood, and pleurisy. GI: see HPI  Derm: Denies rash, itching, dry skin Psych: see HPI.  Heme: Denies bruising, bleeding, and enlarged lymph nodes.  Physical Exam: BP (!) 151/76   Pulse 84   Temp (!) 97.2 F (36.2 C) (Oral)   Ht 5\' 4"  (1.626 m)   Wt 239 lb 3.2 oz (108.5 kg)   BMI 41.06 kg/m  General:   Alert and oriented. No distress noted. Pleasant and cooperative.  Head:  Normocephalic and atraumatic. Eyes:  Conjuctiva clear without scleral icterus. Mouth:  Oral mucosa pink and moist.  Abdomen:  +BS, soft, non-tender and non-distended. No rebound or guarding. No HSM or masses noted. Msk:  Symmetrical without gross deformities. Normal posture. Extremities: ankle/pedal edema Neurologic:  Alert and  oriented x4 Psych:  Alert and cooperative. Normal mood and affect.  Lab Results  Component Value Date   WBC 4.8 11/27/2017   HGB 11.9 11/27/2017   HCT 36.0 11/27/2017   MCV 94 11/27/2017   PLT 293 11/27/2017

## 2018-03-04 DIAGNOSIS — M6281 Muscle weakness (generalized): Secondary | ICD-10-CM | POA: Diagnosis not present

## 2018-03-04 DIAGNOSIS — M25562 Pain in left knee: Secondary | ICD-10-CM | POA: Diagnosis not present

## 2018-03-04 DIAGNOSIS — R262 Difficulty in walking, not elsewhere classified: Secondary | ICD-10-CM | POA: Diagnosis not present

## 2018-03-04 DIAGNOSIS — Z723 Lack of physical exercise: Secondary | ICD-10-CM | POA: Diagnosis not present

## 2018-03-04 DIAGNOSIS — M1712 Unilateral primary osteoarthritis, left knee: Secondary | ICD-10-CM | POA: Diagnosis not present

## 2018-03-05 ENCOUNTER — Ambulatory Visit (INDEPENDENT_AMBULATORY_CARE_PROVIDER_SITE_OTHER): Payer: Medicare Other | Admitting: Family Medicine

## 2018-03-05 ENCOUNTER — Encounter: Payer: Self-pay | Admitting: Family Medicine

## 2018-03-05 VITALS — BP 139/71 | HR 71 | Temp 97.6°F | Ht 64.0 in | Wt 239.0 lb

## 2018-03-05 DIAGNOSIS — R609 Edema, unspecified: Secondary | ICD-10-CM

## 2018-03-05 MED ORDER — HYDROCHLOROTHIAZIDE 25 MG PO TABS: 25.0000 mg | ORAL_TABLET | Freq: Every day | ORAL | 3 refills | Status: DC

## 2018-03-05 NOTE — Progress Notes (Signed)
BP 139/71   Pulse 71   Temp 97.6 F (36.4 C) (Oral)   Ht 5\' 4"  (1.626 m)   Wt 108.4 kg   BMI 41.02 kg/m    Subjective:    Patient ID: Shelly [name], female    DOB: 1941/03/11, 77 y.o.   MRN: [id number]  HPI: Shelly [name] is a 77 y.o. female with history of CAD s/p NSTEMI, CKD III, morbid obesity, HTN, and GERD presenting on 03/05/2018 for bilateral leg swelling that has been going on for a couple months. Patient regularly sees cardiology for heart management and was started on Amlodipine 10 mg in July for HTN, but was decreased to 5 last month due to swelling. However, patient reports that she is still taking the 10 mg despite the last cardiology note saying that she was instructed to stay on 5. Patient notices daily swelling in the lower legs, right worse than left. Patient is still working and feels a pressure sensation at the end of the day. Elevation helps to relieve the pressure. She has not tried compression socks. She denies numbness, tingling, painful ambulation, chest pain, sob, fever/chills, or abdominal pain.  She denies any chest pain or shortness of breath   Relevant past medical, surgical, family and social history reviewed and updated as indicated. Interim medical history since our last visit reviewed. Allergies and medications reviewed and updated.  Review of Systems  Constitutional: Negative for chills and fever.  Respiratory: Negative for cough and shortness of breath.   Cardiovascular: Positive for leg swelling (bilateral). Negative for chest pain.  Gastrointestinal: Negative for abdominal pain, diarrhea, nausea and vomiting.  Musculoskeletal: Positive for arthralgias (left knee).  Skin: Negative for color change and wound.  Neurological: Negative for weakness and numbness.    Per HPI unless specifically indicated above   Allergies as of 03/05/2018      Reactions   Nsaids Other (See Comments)   AVOID all NSAID due to history of GI bleeding        Medication List        Accurate as of 03/05/18  1:12 PM. Always use your most recent med list.          amLODipine 5 MG tablet Commonly known as:  NORVASC Take 1 tablet (5 mg total) by mouth daily.   atorvastatin 40 MG tablet Commonly known as:  LIPITOR TAKE ONE TABLET BY MOUTH DAILY AT 6 PM.   b complex vitamins capsule Take 1 capsule by mouth daily.   cetirizine 10 MG tablet Commonly known as:  ZYRTEC Take 1 tablet (10 mg total) by mouth as needed.   clopidogrel 75 MG tablet Commonly known as:  PLAVIX Take 1 tablet (75 mg total) by mouth daily.   clotrimazole-betamethasone cream Commonly known as:  LOTRISONE Apply topically 2 (two) times daily. to affected area   dexlansoprazole 60 MG capsule Commonly known as:  DEXILANT Take 1 capsule (60 mg total) by mouth daily.   fluticasone 50 MCG/ACT nasal spray Commonly known as:  FLONASE Place 1 spray into both nostrils 2 (two) times daily as needed for allergies or rhinitis.   HYDROcodone-acetaminophen 5-325 MG tablet Commonly known as:  NORCO/VICODIN TAKE ONE TABLET BY MOUTH EVERY 4 TO 6 HOURS AS NEEDED FOR PAIN   hydrocortisone 2.5 % rectal cream Commonly known as:  ANUSOL-HC Place rectally 2 (two) times daily. As needed   lisinopril 40 MG tablet Commonly known as:  PRINIVIL,ZESTRIL TAKE ONE TABLET BY MOUTH DAILY.  metoprolol succinate 25 MG 24 hr tablet Commonly known as:  TOPROL-XL Take 1 tablet (25 mg total) by mouth daily.   multivitamin tablet Take 1 tablet by mouth daily.   neomycin-polymyxin-hydrocortisone 3.5-10000-1 OTIC suspension Commonly known as:  CORTISPORIN Place 3 drops into both ears 3 (three) times daily.   nitroGLYCERIN 0.4 MG SL tablet Commonly known as:  NITROSTAT Place 1 tablet (0.4 mg total) under the tongue every 5 (five) minutes as needed for chest pain.   psyllium 58.6 % powder Commonly known as:  METAMUCIL Take 1 packet by mouth as needed.          Objective:    BP  139/71   Pulse 71   Temp 97.6 F (36.4 C) (Oral)   Ht 5\' 4"  (1.626 m)   Wt 108.4 kg   BMI 41.02 kg/m   Wt Readings from Last 3 Encounters:  03/05/18 108.4 kg  03/03/18 108.5 kg  01/25/18 107.5 kg    Physical Exam  Constitutional: She is oriented to person, place, and time. She appears well-developed and well-nourished.  HENT:  Head: Normocephalic.  Eyes: Conjunctivae are normal.  Cardiovascular: Normal rate, regular rhythm, normal heart sounds and intact distal pulses.  Pulmonary/Chest: Effort normal and breath sounds normal.  Musculoskeletal: She exhibits edema (Trace bilateral lower leg).  Neurological: She is alert and oriented to person, place, and time. No sensory deficit.  Skin: Skin is warm. No erythema.        Assessment & Plan:   Problem List Items Addressed This Visit    None    Visit Diagnoses    Peripheral edema    -  Primary     Peripheral Edema Patient comes in with bilateral leg edema since she was started on Amlodipine 10mg . Last month Cardiology decreased it to 5, but patient reports she has still been taking 10mg . I recommend patient decrease to 5mg . I will prescribe HCTZ to help with blood pressure control. Check kidney function in 2 weeks. I recommend compression stalking for leg swelling.    Follow up plan: Return in about 3 months (around 06/05/2018), or if symptoms worsen or fail to improve, for 2 weeks lab draw come back in 3 months for regular checkup.  Counseling provided for all of the vaccine components No orders of the defined types were placed in this encounter.  Patient was instructed to go back on to the 5 mg, patient seen and examined with Cadence Kathlen Mody PA student, agree with assessment and plan above Caryl Pina, MD [facility] 03/05/2018, 1:12 PM

## 2018-03-09 ENCOUNTER — Other Ambulatory Visit: Payer: Self-pay | Admitting: Family Medicine

## 2018-03-09 DIAGNOSIS — M1712 Unilateral primary osteoarthritis, left knee: Secondary | ICD-10-CM

## 2018-03-09 DIAGNOSIS — F1129 Opioid dependence with unspecified opioid-induced disorder: Secondary | ICD-10-CM

## 2018-03-09 DIAGNOSIS — R52 Pain, unspecified: Secondary | ICD-10-CM

## 2018-03-09 DIAGNOSIS — Z79891 Long term (current) use of opiate analgesic: Secondary | ICD-10-CM

## 2018-03-15 ENCOUNTER — Telehealth: Payer: Self-pay | Admitting: *Deleted

## 2018-03-15 NOTE — Telephone Encounter (Signed)
Pt requesting  

## 2018-03-16 ENCOUNTER — Other Ambulatory Visit: Payer: Self-pay | Admitting: *Deleted

## 2018-03-16 MED ORDER — CLOPIDOGREL BISULFATE 75 MG PO TABS: 75.0000 mg | ORAL_TABLET | Freq: Every day | ORAL | 0 refills | Status: DC

## 2018-03-17 ENCOUNTER — Encounter: Payer: Self-pay | Admitting: Family Medicine

## 2018-03-17 ENCOUNTER — Telehealth: Payer: Self-pay | Admitting: Family Medicine

## 2018-03-17 ENCOUNTER — Other Ambulatory Visit: Payer: Self-pay | Admitting: Gastroenterology

## 2018-03-17 ENCOUNTER — Ambulatory Visit (INDEPENDENT_AMBULATORY_CARE_PROVIDER_SITE_OTHER): Payer: Medicare Other | Admitting: Family Medicine

## 2018-03-17 DIAGNOSIS — Z23 Encounter for immunization: Secondary | ICD-10-CM | POA: Diagnosis not present

## 2018-03-17 DIAGNOSIS — M1712 Unilateral primary osteoarthritis, left knee: Secondary | ICD-10-CM

## 2018-03-17 DIAGNOSIS — Z79891 Long term (current) use of opiate analgesic: Secondary | ICD-10-CM

## 2018-03-17 DIAGNOSIS — F1129 Opioid dependence with unspecified opioid-induced disorder: Secondary | ICD-10-CM

## 2018-03-17 DIAGNOSIS — R52 Pain, unspecified: Secondary | ICD-10-CM

## 2018-03-17 MED ORDER — HYDROCODONE-ACETAMINOPHEN 5-325 MG PO TABS: 1.0000 | ORAL_TABLET | Freq: Two times a day (BID) | ORAL | 0 refills | Status: DC | PRN

## 2018-03-17 NOTE — Telephone Encounter (Signed)
Hydrocodone has two sets of directions - per pharmacy they can not take a verbal - needing a new rx sent with correct instructions. Please advise and re send.

## 2018-03-17 NOTE — Telephone Encounter (Signed)
I sent a new prescription for her

## 2018-03-17 NOTE — Progress Notes (Signed)
BP (!) 154/87   Pulse 78   Temp 98.1 F (36.7 C) (Oral)   Ht 5\' 4"  (1.626 m)   Wt 240 lb (108.9 kg)   BMI 41.20 kg/m    Subjective:    Patient ID: Shelly [name], female    DOB: 12-Apr-1941, 77 y.o.   MRN: [id number]  HPI: Shelly [name] is a 77 y.o. female presenting on 03/17/2018 for med check   HPI Chronic pain management secondary to arthritis in her knee Patient has been taking hydrocodone and takes about 1 to 2/day and says that is not quite lasting as long because she was only getting 1/day before.  She takes the pain medication for bilateral knee pain and hip pain.  She is also been using Voltaren gel as well to help with this.  She rates the pain as moderate in severity and says that it does limit her ability to get around some and especially when she has to walk through grocery stores.  Relevant past medical, surgical, family and social history reviewed and updated as indicated. Interim medical history since our last visit reviewed. Allergies and medications reviewed and updated.  Review of Systems  Constitutional: Negative for chills and fever.  Eyes: Negative for visual disturbance.  Respiratory: Negative for chest tightness and shortness of breath.   Cardiovascular: Negative for chest pain and leg swelling.  Musculoskeletal: Positive for arthralgias. Negative for back pain, gait problem and joint swelling.  Skin: Negative for color change and rash.  Neurological: Negative for light-headedness and headaches.  Psychiatric/Behavioral: Negative for agitation and behavioral problems.  All other systems reviewed and are negative.   Per HPI unless specifically indicated above   Allergies as of 03/17/2018      Reactions   Nsaids Other (See Comments)   AVOID all NSAID due to history of GI bleeding      Medication List        Accurate as of 03/17/18  9:46 AM. Always use your most recent med list.          amLODipine 5 MG tablet Commonly known as:   NORVASC Take 1 tablet (5 mg total) by mouth daily.   atorvastatin 40 MG tablet Commonly known as:  LIPITOR TAKE ONE TABLET BY MOUTH DAILY AT 6 PM.   b complex vitamins capsule Take 1 capsule by mouth daily.   cetirizine 10 MG tablet Commonly known as:  ZYRTEC Take 1 tablet (10 mg total) by mouth as needed.   clopidogrel 75 MG tablet Commonly known as:  PLAVIX Take 1 tablet (75 mg total) by mouth daily.   clotrimazole-betamethasone cream Commonly known as:  LOTRISONE Apply topically 2 (two) times daily. to affected area   dexlansoprazole 60 MG capsule Commonly known as:  DEXILANT Take 1 capsule (60 mg total) by mouth daily.   fluticasone 50 MCG/ACT nasal spray Commonly known as:  FLONASE Place 1 spray into both nostrils 2 (two) times daily as needed for allergies or rhinitis.   hydrochlorothiazide 25 MG tablet Commonly known as:  HYDRODIURIL Take 1 tablet (25 mg total) by mouth daily.   HYDROcodone-acetaminophen 5-325 MG tablet Commonly known as:  NORCO/VICODIN Take 1-2 tablets by mouth 2 (two) times daily as needed for moderate pain. TAKE ONE TABLET BY MOUTH EVERY 4 TO 6 HOURS AS NEEDED FOR PAIN   hydrocortisone 2.5 % rectal cream Commonly known as:  ANUSOL-HC Place rectally 2 (two) times daily. As needed   lisinopril 40 MG tablet Commonly  known as:  PRINIVIL,ZESTRIL TAKE ONE TABLET BY MOUTH DAILY.   metoprolol succinate 25 MG 24 hr tablet Commonly known as:  TOPROL-XL Take 1 tablet (25 mg total) by mouth daily.   multivitamin tablet Take 1 tablet by mouth daily.   neomycin-polymyxin-hydrocortisone 3.5-10000-1 OTIC suspension Commonly known as:  CORTISPORIN Place 3 drops into both ears 3 (three) times daily.   nitroGLYCERIN 0.4 MG SL tablet Commonly known as:  NITROSTAT Place 1 tablet (0.4 mg total) under the tongue every 5 (five) minutes as needed for chest pain.   psyllium 58.6 % powder Commonly known as:  METAMUCIL Take 1 packet by mouth as needed.            Objective:    BP (!) 154/87   Pulse 78   Temp 98.1 F (36.7 C) (Oral)   Ht 5\' 4"  (1.626 m)   Wt 240 lb (108.9 kg)   BMI 41.20 kg/m   Wt Readings from Last 3 Encounters:  03/17/18 240 lb (108.9 kg)  03/05/18 239 lb (108.4 kg)  03/03/18 239 lb 3.2 oz (108.5 kg)    Physical Exam  Constitutional: She is oriented to person, place, and time. She appears well-developed and well-nourished. No distress.  Eyes: Conjunctivae are normal.  Cardiovascular: Normal rate, regular rhythm, normal heart sounds and intact distal pulses.  No murmur heard. Pulmonary/Chest: Effort normal and breath sounds normal. No respiratory distress. She has no wheezes.  Musculoskeletal: Normal range of motion. She exhibits tenderness (Bilateral knee tenderness along medial and lateral joint lines). She exhibits no edema.  Neurological: She is alert and oriented to person, place, and time. Coordination normal.  Skin: Skin is warm and dry. No rash noted. She is not diaphoretic.  Psychiatric: She has a normal mood and affect. Her behavior is normal.  Nursing note and vitals reviewed.       Assessment & Plan:   Problem List Items Addressed This Visit      Musculoskeletal and Integument   Osteoarthritis left knee    Other Visit Diagnoses    Pain management       Opioid dependence with opioid-induced disorder ([place])       Chronic prescription opiate use        Patient had not started hydrochlorothiazide yet, recommended for her to go try it on the weekend first to make sure she does not have too much urination and then we will see her back in 4 weeks for a can panel.  Increase Norco to 2 times daily to see if it does are better.  Follow up plan: Return in about 4 weeks (around 04/14/2018), or if symptoms worsen or fail to improve, for Hypertension recheck.  Counseling provided for all of the vaccine components No orders of the defined types were placed in this encounter.   Caryl Pina,  MD [facility] 03/17/2018, 9:46 AM

## 2018-03-19 ENCOUNTER — Other Ambulatory Visit: Payer: Self-pay

## 2018-03-19 ENCOUNTER — Other Ambulatory Visit: Payer: Medicare Other

## 2018-03-19 MED ORDER — DICLOFENAC SODIUM 1 % TD GEL: 2.0000 g | Freq: Four times a day (QID) | TRANSDERMAL | 2 refills | Status: DC

## 2018-03-19 NOTE — Telephone Encounter (Signed)
Pharmacy sending over RX for Diclofenac 1% gel  Apply 4 gms three times daily for knee pain   Not on med list

## 2018-03-19 NOTE — Addendum Note (Signed)
Addended by: Caryl Pina on: 03/19/2018 12:26 PM   Modules accepted: Orders

## 2018-04-08 ENCOUNTER — Telehealth: Payer: Self-pay | Admitting: Family Medicine

## 2018-04-08 MED ORDER — BUSPIRONE HCL 15 MG PO TABS: 15.0000 mg | ORAL_TABLET | Freq: Three times a day (TID) | ORAL | 0 refills | Status: DC

## 2018-04-08 NOTE — Telephone Encounter (Signed)
Buspar 15 mg Prescription sent to pharmacy.

## 2018-04-08 NOTE — Telephone Encounter (Signed)
Patient aware.

## 2018-04-16 ENCOUNTER — Ambulatory Visit: Payer: Medicare Other | Admitting: Family Medicine

## 2018-04-20 ENCOUNTER — Encounter: Payer: Self-pay | Admitting: Family Medicine

## 2018-04-21 ENCOUNTER — Emergency Department (HOSPITAL_COMMUNITY)
Admission: EM | Admit: 2018-04-21 | Discharge: 2018-04-21 | Disposition: A | Discharge status: Home / Self Care | Payer: Medicare Other | Attending: Emergency Medicine | Admitting: Emergency Medicine

## 2018-04-21 ENCOUNTER — Encounter (HOSPITAL_COMMUNITY): Payer: Self-pay | Admitting: Emergency Medicine

## 2018-04-21 ENCOUNTER — Other Ambulatory Visit: Payer: Self-pay

## 2018-04-21 DIAGNOSIS — Z634 Disappearance and death of family member: Secondary | ICD-10-CM | POA: Diagnosis not present

## 2018-04-21 DIAGNOSIS — Z79899 Other long term (current) drug therapy: Secondary | ICD-10-CM | POA: Diagnosis not present

## 2018-04-21 DIAGNOSIS — N183 Chronic kidney disease, stage 3 (moderate): Secondary | ICD-10-CM | POA: Diagnosis not present

## 2018-04-21 DIAGNOSIS — K30 Functional dyspepsia: Secondary | ICD-10-CM | POA: Insufficient documentation

## 2018-04-21 DIAGNOSIS — R21 Rash and other nonspecific skin eruption: Secondary | ICD-10-CM | POA: Diagnosis present

## 2018-04-21 DIAGNOSIS — R109 Unspecified abdominal pain: Secondary | ICD-10-CM | POA: Diagnosis not present

## 2018-04-21 DIAGNOSIS — R0789 Other chest pain: Secondary | ICD-10-CM | POA: Diagnosis not present

## 2018-04-21 DIAGNOSIS — B029 Zoster without complications: Secondary | ICD-10-CM | POA: Insufficient documentation

## 2018-04-21 DIAGNOSIS — I129 Hypertensive chronic kidney disease with stage 1 through stage 4 chronic kidney disease, or unspecified chronic kidney disease: Secondary | ICD-10-CM | POA: Diagnosis not present

## 2018-04-21 DIAGNOSIS — I2510 Atherosclerotic heart disease of native coronary artery without angina pectoris: Secondary | ICD-10-CM | POA: Diagnosis not present

## 2018-04-21 DIAGNOSIS — Z7902 Long term (current) use of antithrombotics/antiplatelets: Secondary | ICD-10-CM | POA: Diagnosis not present

## 2018-04-21 LAB — COMPREHENSIVE METABOLIC PANEL
ALT: 14 U/L (ref 0–44)
AST: 16 U/L (ref 15–41)
Albumin: 3.4 g/dL — ABNORMAL LOW (ref 3.5–5.0)
Alkaline Phosphatase: 87 U/L (ref 38–126)
Anion gap: 9 (ref 5–15)
BUN: 15 mg/dL (ref 8–23)
CO2: 21 mmol/L — ABNORMAL LOW (ref 22–32)
Calcium: 9.5 mg/dL (ref 8.9–10.3)
Chloride: 107 mmol/L (ref 98–111)
Creatinine, Ser: 1.19 mg/dL — ABNORMAL HIGH (ref 0.44–1.00)
GFR, EST AFRICAN AMERICAN: 51 mL/min — AB (ref >60)
GFR, EST NON AFRICAN AMERICAN: 44 mL/min — AB (ref >60)
Glucose, Bld: 129 mg/dL — ABNORMAL HIGH (ref 70–99)
POTASSIUM: 4.1 mmol/L (ref 3.5–5.1)
SODIUM: 137 mmol/L (ref 135–145)
Total Bilirubin: 0.4 mg/dL (ref 0.3–1.2)
Total Protein: 6.9 g/dL (ref 6.5–8.1)

## 2018-04-21 LAB — URINALYSIS, ROUTINE W REFLEX MICROSCOPIC
BILIRUBIN URINE: NEGATIVE
Glucose, UA: NEGATIVE mg/dL
Hgb urine dipstick: NEGATIVE
Ketones, ur: NEGATIVE mg/dL
Leukocytes, UA: NEGATIVE
Nitrite: NEGATIVE
Protein, ur: NEGATIVE mg/dL
Specific Gravity, Urine: 1.013 (ref 1.005–1.030)
pH: 5 (ref 5.0–8.0)

## 2018-04-21 LAB — CBC WITH DIFFERENTIAL/PLATELET
ABS IMMATURE GRANULOCYTES: 0.02 10*3/uL (ref 0.00–0.07)
BASOS ABS: 0 10*3/uL (ref 0.0–0.1)
Basophils Relative: 1 %
Eosinophils Absolute: 0 10*3/uL (ref 0.0–0.5)
Eosinophils Relative: 0 %
HCT: 36.8 % (ref 36.0–46.0)
HEMOGLOBIN: 11.9 g/dL — AB (ref 12.0–15.0)
IMMATURE GRANULOCYTES: 0 %
LYMPHS PCT: 22 %
Lymphs Abs: 1.5 10*3/uL (ref 0.7–4.0)
MCH: 30 pg (ref 26.0–34.0)
MCHC: 32.3 g/dL (ref 30.0–36.0)
MCV: 92.7 fL (ref 80.0–100.0)
Monocytes Absolute: 0.7 10*3/uL (ref 0.1–1.0)
Monocytes Relative: 11 %
NEUTROS ABS: 4.4 10*3/uL (ref 1.7–7.7)
NEUTROS PCT: 66 %
NRBC: 0 % (ref 0.0–0.2)
Platelets: 273 10*3/uL (ref 150–400)
RBC: 3.97 MIL/uL (ref 3.87–5.11)
RDW: 12.8 % (ref 11.5–15.5)
WBC: 6.6 10*3/uL (ref 4.0–10.5)

## 2018-04-21 LAB — I-STAT TROPONIN, ED: Troponin i, poc: 0 ng/mL (ref 0.00–0.08)

## 2018-04-21 LAB — LIPASE, BLOOD: Lipase: 31 U/L (ref 11–51)

## 2018-04-21 MED ORDER — SUCRALFATE 1 GM/10ML PO SUSP: 1.0000 g | Freq: Three times a day (TID) | ORAL | 0 refills | Status: DC

## 2018-04-21 MED ORDER — VALACYCLOVIR HCL 1 G PO TABS: 1000.0000 mg | ORAL_TABLET | Freq: Three times a day (TID) | ORAL | 0 refills | Status: DC

## 2018-04-21 MED ORDER — VALACYCLOVIR HCL 500 MG PO TABS
1000.0000 mg | ORAL_TABLET | Freq: Once | ORAL | Status: AC
  Administered 2018-04-21: 1000 mg via ORAL
  Filled 2018-04-21: qty 2

## 2018-04-21 MED ORDER — FAMOTIDINE 20 MG PO TABS: 20.0000 mg | ORAL_TABLET | Freq: Two times a day (BID) | ORAL | 0 refills | Status: DC

## 2018-04-21 MED ORDER — LIDOCAINE VISCOUS HCL 2 % MT SOLN
15.0000 mL | Freq: Once | OROMUCOSAL | Status: AC
  Administered 2018-04-21: 15 mL via ORAL
  Filled 2018-04-21: qty 15

## 2018-04-21 MED ORDER — ALUM & MAG HYDROXIDE-SIMETH 200-200-20 MG/5ML PO SUSP
30.0000 mL | Freq: Once | ORAL | Status: AC
  Administered 2018-04-21: 30 mL via ORAL
  Filled 2018-04-21: qty 30

## 2018-04-21 NOTE — ED Provider Notes (Signed)
[facility] EMERGENCY DEPARTMENT Provider Note   CSN: [id number] Arrival date & time: 04/21/18  0222     History   Chief Complaint Chief Complaint  Patient presents with  . Rash    HPI Shelly [name] is a 77 y.o. female.  Patient presents to the emergency department for evaluation of a rash.  Patient reports that she broke out with a rash on her upper back 2 days ago.  Rash has spread now under her arm.  She reports that the rash burns mostly, however there is more of an actual pain in the area under her arm.  Patient also reports that she has been experiencing increased reflux.  She has a history of GERD, takes Dexilant.  She reports that she lost a daughter 1-1/2 weeks ago and has been under a great deal of stress.  Since then she has been experiencing increased burning sensation in her upper abdomen, similar to what she has had with her GERD in the past.     Past Medical History:  Diagnosis Date  . Allergy   . Anemia    GI bleed  . Anginal pain ([place])   . Anxiety   . Arthritis   . Blood transfusion without reported diagnosis   . Cataract   . Chronic kidney disease   . Coronary artery disease    SEVERE  . GERD (gastroesophageal reflux disease)   . GI bleed 12/2013  . Goiter   . Hyperlipidemia   . Hypertension   . Myocardial infarct ([place])   . Myocardial infarction ([place]) 08/2013  . Neuralgia of right lower extremity 07/13/2017  . Pancolonic diverticulosis 12/2013    Patient Active Problem List   Diagnosis Date Noted  . Neuralgia of right lower extremity 07/13/2017  . Iron deficiency anemia 08/27/2016  . Morbid obesity ([place]) 08/27/2016  . CKD (chronic kidney disease), stage III ([place]) 04/04/2016  . Osteoarthritis left knee 04/04/2016  . Third degree heart block ([place]) 12/10/2015  . Pancolonic diverticulosis 12/09/2015  . CAD (coronary artery disease) 01/11/2014  . History of non-ST elevation myocardial infarction (NSTEMI) 08/23/2013  . IDA  (iron deficiency anemia) 08/23/2013  . Essential hypertension 08/22/2013  . GERD (gastroesophageal reflux disease) 08/22/2013  . Anxiety 08/22/2013    Past Surgical History:  Procedure Laterality Date  . COLONOSCOPY N/A 01/13/2014   Dr. Hung:pandiverticulosis   . COLONOSCOPY N/A 12/14/2015   Dr. Michail Sermon: pancolonic diverticulosis, internal hemorrhoids   . CORNEAL TRANSPLANT    . CORONARY ANGIOPLASTY  08/2013  . ESOPHAGOGASTRODUODENOSCOPY N/A 12/14/2015   Dr. Michail Sermon: normal   . GIVENS CAPSULE STUDY N/A 08/13/2016   Dr. Oneida Alar: enteritis due to aspirin.   Marland Kitchen HEEL SPUR EXCISION    . LEFT HEART CATHETERIZATION WITH CORONARY ANGIOGRAM N/A 08/23/2013   Procedure: LEFT HEART CATHETERIZATION WITH CORONARY ANGIOGRAM;  Surgeon: Peter M Martinique, MD;  Location: [facility] CATH LAB;  Service: Cardiovascular;  Laterality: N/A;  . PERCUTANEOUS CORONARY STENT INTERVENTION (PCI-S) N/A 08/24/2013   Procedure: PERCUTANEOUS CORONARY STENT INTERVENTION (PCI-S);  Surgeon: Peter M Martinique, MD;  Location: [facility] CATH LAB;  Service: Cardiovascular;  Laterality: N/A;  . TONSILLECTOMY    . TUBAL LIGATION       OB History   None      Home Medications    Prior to Admission medications   Medication Sig Start Date End Date Taking? Authorizing Provider  amLODipine (NORVASC) 5 MG tablet Take 1 tablet (5 mg total) by mouth daily. 11/23/17 04/21/26  Yes Imogene Burn, PA-C  atorvastatin (LIPITOR) 40 MG tablet TAKE ONE TABLET BY MOUTH DAILY AT 6 PM. Patient taking differently: Take 40 mg by mouth daily at 6 PM.  01/21/18  Yes [name], [name], MD  b complex vitamins capsule Take 1 capsule by mouth daily.   Yes [provider]  busPIRone (BUSPAR) 15 MG tablet Take 1 tablet (15 mg total) by mouth 3 (three) times daily. 04/08/18  Yes Hawks, Christy A, FNP  clopidogrel (PLAVIX) 75 MG tablet Take 1 tablet (75 mg total) by mouth daily. 03/16/18  Yes Herminio Commons, MD  dexlansoprazole (DEXILANT) 60 MG capsule Take 1  capsule (60 mg total) by mouth daily. 09/30/16  Yes Annitta Needs, NP  hydrochlorothiazide (HYDRODIURIL) 25 MG tablet Take 1 tablet (25 mg total) by mouth daily. 03/05/18  Yes [name], [name], MD  lisinopril (PRINIVIL,ZESTRIL) 40 MG tablet TAKE ONE TABLET BY MOUTH DAILY. Patient taking differently: Take 40 mg by mouth daily.  01/04/18  Yes Herminio Commons, MD  metoprolol succinate (TOPROL XL) 25 MG 24 hr tablet Take 1 tablet (25 mg total) by mouth daily. 11/23/17  Yes Imogene Burn, PA-C  Multiple Vitamin (MULTIVITAMIN) tablet Take 1 tablet by mouth daily.   Yes [provider]  nitroGLYCERIN (NITROSTAT) 0.4 MG SL tablet Place 1 tablet (0.4 mg total) under the tongue every 5 (five) minutes as needed for chest pain. 04/16/15  Yes Herminio Commons, MD  psyllium (METAMUCIL) 58.6 % powder Take 1 packet by mouth as needed (for constipation).    Yes [provider]  cetirizine (ZYRTEC) 10 MG tablet Take 1 tablet (10 mg total) by mouth as needed. Patient not taking: Reported on 04/21/2018 07/13/17   Raylene Everts, MD  clotrimazole-betamethasone (LOTRISONE) cream Apply topically 2 (two) times daily. to affected area Patient not taking: Reported on 04/21/2018 01/21/18   [name], [name], MD  diclofenac sodium (VOLTAREN) 1 % GEL Apply 2 g topically 4 (four) times daily. Patient not taking: Reported on 04/21/2018 03/19/18   [name], [name], MD  famotidine (PEPCID) 20 MG tablet Take 1 tablet (20 mg total) by mouth 2 (two) times daily. 04/21/18   Orpah Greek, MD  fluticasone (FLONASE) 50 MCG/ACT nasal spray Place 1 spray into both nostrils 2 (two) times daily as needed for allergies or rhinitis. Patient not taking: Reported on 04/21/2018 01/21/18   [name], [name], MD  HYDROcodone-acetaminophen (NORCO/VICODIN) 5-325 MG tablet Take 1-2 tablets by mouth 2 (two) times daily as needed for moderate pain. Patient not taking: Reported on 04/21/2018 03/17/18   [name],  [name], MD  neomycin-polymyxin-hydrocortisone (CORTISPORIN) 3.5-10000-1 OTIC suspension Place 3 drops into both ears 3 (three) times daily. Patient not taking: Reported on 04/21/2018 01/29/17   Raylene Everts, MD  PROCTOZONE-HC 2.5 % rectal cream PLACE RECTALLY TWICE DAILY AS NEEDED Patient not taking: Reported on 04/21/2018 03/24/18   Mahala Menghini, PA-C  sucralfate (CARAFATE) 1 GM/10ML suspension Take 10 mLs (1 g total) by mouth 4 (four) times daily -  with meals and at bedtime. 04/21/18   Orpah Greek, MD  valACYclovir (VALTREX) 1000 MG tablet Take 1 tablet (1,000 mg total) by mouth 3 (three) times daily. 04/21/18   Orpah Greek, MD    Family History Family History  Problem Relation Age of Onset  . Hypertension Brother   . Transient ischemic attack Brother   . Cancer Mother  unsure of type   . COPD Mother   . Asthma Sister   . Stroke Sister   . Stroke Sister   . Early death Brother   . Early death Brother   . Liver cancer Daughter   . Hernia Son        Umbilical  . Pancreatic disease Daughter        pancreatectomy  . Diverticulitis Daughter   . Anemia Daughter   . GER disease Son     Social History Social History   Tobacco Use  . Smoking status: Never Smoker  . Smokeless tobacco: Never Used  Substance Use Topics  . Alcohol use: No    Alcohol/week: 0.0 standard drinks  . Drug use: No     Allergies   Nsaids   Review of Systems Review of Systems  Gastrointestinal: Positive for abdominal pain and nausea.  Skin: Positive for rash.  All other systems reviewed and are negative.    Physical Exam Updated Vital Signs BP (!) 148/72   Pulse 69   Temp 99.3 F (37.4 C) (Oral)   Resp 16   Ht 5\' 3"  (1.6 m)   Wt 105.2 kg   SpO2 100%   BMI 41.10 kg/m   Physical Exam  Constitutional: She is oriented to person, place, and time. She appears well-developed and well-nourished. No distress.  HENT:  Head: Normocephalic and atraumatic.    Right Ear: Hearing normal.  Left Ear: Hearing normal.  Nose: Nose normal.  Mouth/Throat: Oropharynx is clear and moist and mucous membranes are normal.  Eyes: Pupils are equal, round, and reactive to light. Conjunctivae and EOM are normal.  Neck: Normal range of motion. Neck supple.  Cardiovascular: Regular rhythm, S1 normal and S2 normal. Exam reveals no gallop and no friction rub.  No murmur heard. Pulmonary/Chest: Effort normal and breath sounds normal. No respiratory distress. She exhibits no tenderness.  Abdominal: Soft. Normal appearance and bowel sounds are normal. There is no hepatosplenomegaly. There is no tenderness. There is no rebound, no guarding, no tenderness at McBurney's point and negative Murphy's sign. No hernia.  Musculoskeletal: Normal range of motion.  Neurological: She is alert and oriented to person, place, and time. She has normal strength. No cranial nerve deficit or sensory deficit. Coordination normal. GCS eye subscore is 4. GCS verbal subscore is 5. GCS motor subscore is 6.  Skin: Skin is warm, dry and intact. Rash (Vesicular rash on erythematous base in a linear dermatomal distribution right mid back) noted. No cyanosis.  Psychiatric: Her speech is normal and behavior is normal. Thought content normal.  Tearful when talking about the daughter that just died  Nursing note and vitals reviewed.    ED Treatments / Results  Labs (all labs ordered are listed, but only abnormal results are displayed) Labs Reviewed  COMPREHENSIVE METABOLIC PANEL - Abnormal; Notable for the following components:      Result Value   CO2 21 (*)    Glucose, Bld 129 (*)    Creatinine, Ser 1.19 (*)    Albumin 3.4 (*)    GFR calc non Af Amer 44 (*)    GFR calc Af Amer 51 (*)    All other components within normal limits  CBC WITH DIFFERENTIAL/PLATELET - Abnormal; Notable for the following components:   Hemoglobin 11.9 (*)    All other components within normal limits  URINALYSIS,  ROUTINE W REFLEX MICROSCOPIC - Abnormal; Notable for the following components:   APPearance HAZY (*)  All other components within normal limits  LIPASE, BLOOD  I-STAT TROPONIN, ED    EKG EKG Interpretation  Date/Time:  Wednesday April 21 2018 04:54:51 EST Ventricular Rate:  69 PR Interval:  154 QRS Duration: 84 QT Interval:  370 QTC Calculation: 396 R Axis:   2 Text Interpretation:  Normal sinus rhythm Normal ECG Confirmed by Orpah Greek [phone]) on 04/21/2018 5:01:33 AM   Radiology No results found.  Procedures Procedures (including critical care time)  Medications Ordered in ED Medications  valACYclovir (VALTREX) tablet 1,000 mg (1,000 mg Oral Given 04/21/18 0505)  alum & mag hydroxide-simeth (MAALOX/MYLANTA) 200-200-20 MG/5ML suspension 30 mL (30 mLs Oral Given 04/21/18 0452)    And  lidocaine (XYLOCAINE) 2 % viscous mouth solution 15 mL (15 mLs Oral Given 04/21/18 0452)     Initial Impression / Assessment and Plan / ED Course  I have reviewed the triage vital signs and the nursing notes.  Pertinent labs & imaging results that were available during my care of the patient were reviewed by me and considered in my medical decision making (see chart for details).     Patient presents for evaluation of a rash in the right side of her back that is tracking along the side of her chest wall.  This is consistent with shingles.  Rash present for 2 days, will initiate Valtrex.  She has hydrocodone at home to use for pain.  No sign of any complicating features.  She also complains of increased indigestion.  She has a history of GERD.  She takes Dexilant for GERD, but has had increased symptoms over the last week.  Shingles as well as the increased GERD are likely secondary to the increased stress after her daughter died 1-1/2 weeks ago.  Will add Pepcid and Carafate, follow-up with PCP.  She does not have any chest pain, shortness of breath.  Cardiac work-up negative  today.  Lab work normal, no sign of anemia or bleeding.  Final Clinical Impressions(s) / ED Diagnoses   Final diagnoses:  Herpes zoster without complication    ED Discharge Orders         Ordered    famotidine (PEPCID) 20 MG tablet  2 times daily     04/21/18 0532    sucralfate (CARAFATE) 1 GM/10ML suspension  3 times daily with meals & bedtime     04/21/18 0532    valACYclovir (VALTREX) 1000 MG tablet  3 times daily     04/21/18 0532           Orpah Greek, MD 04/21/18 [phone]

## 2018-04-21 NOTE — ED Triage Notes (Signed)
Pt has a rash on the right side of her back, blisters. C/o nausea, fatigue. Pt now states she also has abdominal pain

## 2018-04-21 NOTE — ED Notes (Signed)
Patient verbalizes understanding of discharge instructions. Opportunity for questioning and answers were provided. Armband removed by staff, pt discharged from ED in wheelchair.  

## 2018-04-22 ENCOUNTER — Telehealth: Payer: Self-pay | Admitting: Family Medicine

## 2018-04-22 MED ORDER — TRIAMCINOLONE ACETONIDE 0.1 % EX CREA: 1.0000 "application " | TOPICAL_CREAM | Freq: Two times a day (BID) | CUTANEOUS | 0 refills | Status: DC

## 2018-04-22 NOTE — Telephone Encounter (Signed)
[facility]   Pt states that she is taking a pill for her shingles, she was seen yesterday at ER for this, pt is wanting to know if she can have something sent in for the itching.

## 2018-04-22 NOTE — Telephone Encounter (Signed)
I sent triamcinolone cream for, instructed not to use it on her face or her groin but she can use it to help with itching

## 2018-04-23 ENCOUNTER — Ambulatory Visit (INDEPENDENT_AMBULATORY_CARE_PROVIDER_SITE_OTHER): Payer: Medicare Other | Admitting: Family Medicine

## 2018-04-23 ENCOUNTER — Encounter: Payer: Self-pay | Admitting: Family Medicine

## 2018-04-23 VITALS — BP 111/63 | HR 82 | Temp 97.0°F | Ht 63.0 in | Wt 233.0 lb

## 2018-04-23 DIAGNOSIS — B029 Zoster without complications: Secondary | ICD-10-CM | POA: Diagnosis not present

## 2018-04-23 DIAGNOSIS — K219 Gastro-esophageal reflux disease without esophagitis: Secondary | ICD-10-CM | POA: Diagnosis not present

## 2018-04-23 MED ORDER — ONDANSETRON 4 MG PO TBDP: 4.0000 mg | ORAL_TABLET | Freq: Four times a day (QID) | ORAL | 0 refills | Status: DC | PRN

## 2018-04-23 MED ORDER — ONDANSETRON HCL 4 MG PO TABS: ORAL_TABLET | ORAL | 0 refills | Status: DC

## 2018-04-23 NOTE — Progress Notes (Signed)
Subjective:  Patient ID: Shelly [name], female    DOB: [birth year]  Age: 77 y.o. MRN: [id number]  CC: Discuss meds (Seen at ER and changed meds and they are upsetting her stomach)   HPI Shelly [name] presents for follow-up of her shingles as well as confusion over medications.  She has been taking the Valtrex given to her through the emergency room.  (Report reviewed).  Unfortunately this is causing her a great deal of nausea.  This is separate from her reflux.  However, she is having significant reflux and was prescribed Carafate and famotidine to take in addition to her Dexilant.  She has not started taking those because she does not understand what they are for and is worried about being overmedicated.  The shingles in the meantime are very uncomfortable although they have started drying up.  She cannot tolerate the nausea any further.  She would like to have something else for the shingles but her daughter, who accompanies her and gives history today as well, states that the medicine cost $95 and they do not know if they can afford anything else.  Depression screen [facility] 2/9 04/23/2018 03/17/2018 03/05/2018  Decreased Interest 2 3 0  Down, Depressed, Hopeless 0 0 0  PHQ - 2 Score 2 3 0  Altered sleeping 2 0 -  Tired, decreased energy 2 3 -  Change in appetite 2 3 -  Feeling bad or failure about yourself  0 0 -  Trouble concentrating 0 0 -  Moving slowly or fidgety/restless 0 0 -  Suicidal thoughts 0 0 -  PHQ-9 Score 8 9 -    History Shelly [name] has a past medical history of Allergy, Anemia, Anginal pain ([place]), Anxiety, Arthritis, Blood transfusion without reported diagnosis, Cataract, Chronic kidney disease, Coronary artery disease, GERD (gastroesophageal reflux disease), GI bleed (12/2013), Goiter, Hyperlipidemia, Hypertension, Myocardial infarct [facility]), Myocardial infarction ([place]) (08/2013), Neuralgia of right lower extremity (07/13/2017), and Pancolonic diverticulosis (12/2013).   She  has a past surgical history that includes Corneal transplant; Tonsillectomy; Tubal ligation; Excision heel spur excision; Coronary angioplasty (08/2013); Colonoscopy (N/A, 01/13/2014); left heart catheterization with coronary angiogram (N/A, 08/23/2013); percutaneous coronary stent intervention (pci-s) (N/A, 08/24/2013); Colonoscopy (N/A, 12/14/2015); Esophagogastroduodenoscopy (N/A, 12/14/2015); and Givens capsule study (N/A, 08/13/2016).   Her family history includes Anemia in her daughter; Asthma in her sister; COPD in her mother; Cancer in her mother; Diverticulitis in her daughter; Early death in her brother and brother; GER disease in her son; Hernia in her son; Hypertension in her brother; Liver cancer in her daughter; Pancreatic disease in her daughter; Stroke in her sister and sister; Transient ischemic attack in her brother.She reports that she has never smoked. She has never used smokeless tobacco. She reports that she does not drink alcohol or use drugs.    ROS Review of Systems  Constitutional: Positive for activity change.  HENT: Negative for congestion.   Eyes: Negative for visual disturbance.  Respiratory: Negative for shortness of breath.   Cardiovascular: Negative for chest pain.  Gastrointestinal: Positive for abdominal pain and nausea. Negative for constipation, diarrhea and vomiting.  Genitourinary: Negative for difficulty urinating.  Musculoskeletal: Negative for arthralgias and myalgias.  Skin: Positive for rash.  Neurological: Negative for headaches.  Psychiatric/Behavioral: Negative for sleep disturbance.    Objective:  BP 111/63   Pulse 82   Temp (!) 97 F (36.1 C) (Oral)   Ht 5\' 3"  (1.6 m)   Wt 233 lb (105.7 kg)   BMI  41.27 kg/m   BP Readings from Last 3 Encounters:  04/23/18 111/63  04/21/18 (!) 148/72  03/17/18 (!) 154/87    Wt Readings from Last 3 Encounters:  04/23/18 233 lb (105.7 kg)  04/21/18 232 lb (105.2 kg)  03/17/18 240 lb (108.9 kg)      Physical Exam  Constitutional: She is oriented to person, place, and time. She appears well-developed and well-nourished. No distress.  Cardiovascular: Normal rate and regular rhythm.  Pulmonary/Chest: Breath sounds normal.  Abdominal: Soft. There is tenderness (Mild diffuse). There is no guarding.  Neurological: She is alert and oriented to person, place, and time.  Skin: Skin is warm and dry. Rash (Typical herpetiform lesion in dermatomal form extends from the midline of the back to the right over the shoulder blade region into the axilla and around to the right breast approaching the midline sternal region.  Approximate level is T3 dermatome) noted.  Psychiatric: She has a normal mood and affect.      Assessment & Plan:   Shelly [name] was seen today for discuss meds.  Diagnoses and all orders for this visit:  Herpes zoster without complication  Gastroesophageal reflux disease, esophagitis presence not specified  Other orders -     ondansetron (ZOFRAN) 4 MG tablet; Take one TID as directed( with Valtrex pill ) -     ondansetron (ZOFRAN-ODT) 4 MG disintegrating tablet; Take 1 tablet (4 mg total) by mouth every 6 (six) hours as needed for nausea or vomiting.       I am having Shelly [name] start on ondansetron and ondansetron. I am also having her maintain her b complex vitamins, nitroGLYCERIN, psyllium, dexlansoprazole, neomycin-polymyxin-hydrocortisone, cetirizine, multivitamin, amLODipine, metoprolol succinate, lisinopril, atorvastatin, clotrimazole-betamethasone, fluticasone, hydrochlorothiazide, clopidogrel, HYDROcodone-acetaminophen, PROCTOZONE-HC, diclofenac sodium, busPIRone, famotidine, sucralfate, valACYclovir, and triamcinolone cream.  Allergies as of 04/23/2018      Reactions   Nsaids Other (See Comments)   AVOID all NSAID due to history of GI bleeding      Medication List        Accurate as of 04/23/18 11:59 PM. Always use your most recent med list.           amLODipine 5 MG tablet Commonly known as:  NORVASC Take 1 tablet (5 mg total) by mouth daily.   atorvastatin 40 MG tablet Commonly known as:  LIPITOR TAKE ONE TABLET BY MOUTH DAILY AT 6 PM.   b complex vitamins capsule Take 1 capsule by mouth daily.   busPIRone 15 MG tablet Commonly known as:  BUSPAR Take 1 tablet (15 mg total) by mouth 3 (three) times daily.   cetirizine 10 MG tablet Commonly known as:  ZYRTEC Take 1 tablet (10 mg total) by mouth as needed.   clopidogrel 75 MG tablet Commonly known as:  PLAVIX Take 1 tablet (75 mg total) by mouth daily.   clotrimazole-betamethasone cream Commonly known as:  LOTRISONE Apply topically 2 (two) times daily. to affected area   dexlansoprazole 60 MG capsule Commonly known as:  DEXILANT Take 1 capsule (60 mg total) by mouth daily.   diclofenac sodium 1 % Gel Commonly known as:  VOLTAREN Apply 2 g topically 4 (four) times daily.   famotidine 20 MG tablet Commonly known as:  PEPCID Take 1 tablet (20 mg total) by mouth 2 (two) times daily.   fluticasone 50 MCG/ACT nasal spray Commonly known as:  FLONASE Place 1 spray into both nostrils 2 (two) times daily as needed for allergies or rhinitis.   hydrochlorothiazide  25 MG tablet Commonly known as:  HYDRODIURIL Take 1 tablet (25 mg total) by mouth daily.   HYDROcodone-acetaminophen 5-325 MG tablet Commonly known as:  NORCO/VICODIN Take 1-2 tablets by mouth 2 (two) times daily as needed for moderate pain.   lisinopril 40 MG tablet Commonly known as:  PRINIVIL,ZESTRIL TAKE ONE TABLET BY MOUTH DAILY.   metoprolol succinate 25 MG 24 hr tablet Commonly known as:  TOPROL-XL Take 1 tablet (25 mg total) by mouth daily.   multivitamin tablet Take 1 tablet by mouth daily.   neomycin-polymyxin-hydrocortisone 3.5-10000-1 OTIC suspension Commonly known as:  CORTISPORIN Place 3 drops into both ears 3 (three) times daily.   nitroGLYCERIN 0.4 MG SL tablet Commonly known  as:  NITROSTAT Place 1 tablet (0.4 mg total) under the tongue every 5 (five) minutes as needed for chest pain.   ondansetron 4 MG disintegrating tablet Commonly known as:  ZOFRAN-ODT Take 1 tablet (4 mg total) by mouth every 6 (six) hours as needed for nausea or vomiting.   ondansetron 4 MG tablet Commonly known as:  ZOFRAN Take one TID as directed( with Valtrex pill )   PROCTOZONE-HC 2.5 % rectal cream Generic drug:  hydrocortisone PLACE RECTALLY TWICE DAILY AS NEEDED   psyllium 58.6 % powder Commonly known as:  METAMUCIL Take 1 packet by mouth as needed (for constipation).   sucralfate 1 GM/10ML suspension Commonly known as:  CARAFATE Take 10 mLs (1 g total) by mouth 4 (four) times daily -  with meals and at bedtime.   triamcinolone cream 0.1 % Commonly known as:  KENALOG Apply 1 application topically 2 (two) times daily.   valACYclovir 1000 MG tablet Commonly known as:  VALTREX Take 1 tablet (1,000 mg total) by mouth 3 (three) times daily.      Valtrex is likely the best medicine available for her shingles.  The other possibilities are not much better with regard to risk of nausea.  Therefore, based on expense they prefer as suggested to use an ondansetron with each Valtrex since it seems to be helping the shingles dry up.  If this does not seem to be useful will consider a switch to Famvir.  With regard to the reflux.  She has not been taking the Dexilant on an empty stomach.  She is going to try doing that.  I wrote out very carefully for her to take that medication 2 hours or more after her last oral consumption other than water and to wait another hour after eating before consuming medication or food.  The only liquid during that time is water.  This should help with the medication, Dexilant to be more effective for her.  Should it not be adequate she can at that time begin taking the Carafate before meals and the famotidine daily.  Follow-up: Return in about 1 month (around  05/24/2018), or if symptoms worsen or fail to improve.  Claretta Fraise, M.D.

## 2018-04-23 NOTE — Patient Instructions (Signed)
EMPTY STOMACH -- nothing to eat or drink for 2 hours before DEXILANT and 1 hour after.  Take the Dexilant on a ampty stomach - if that doesn't help, add the Sucralfate and Famotidine.

## 2018-04-24 ENCOUNTER — Encounter: Payer: Self-pay | Admitting: Family Medicine

## 2018-04-24 DIAGNOSIS — B029 Zoster without complications: Secondary | ICD-10-CM | POA: Insufficient documentation

## 2018-04-29 NOTE — Telephone Encounter (Signed)
Attempted to contact pt without return call in over 3 days, will close encounter.

## 2018-04-30 ENCOUNTER — Other Ambulatory Visit: Payer: Self-pay | Admitting: Family Medicine

## 2018-05-05 ENCOUNTER — Encounter: Payer: Self-pay | Admitting: Family Medicine

## 2018-05-05 ENCOUNTER — Ambulatory Visit (INDEPENDENT_AMBULATORY_CARE_PROVIDER_SITE_OTHER): Payer: Medicare Other | Admitting: Family Medicine

## 2018-05-05 VITALS — BP 158/64 | HR 84 | Temp 97.0°F | Ht 63.0 in | Wt 237.6 lb

## 2018-05-05 DIAGNOSIS — B029 Zoster without complications: Secondary | ICD-10-CM | POA: Diagnosis not present

## 2018-05-05 MED ORDER — AMITRIPTYLINE HCL 50 MG PO TABS: 50.0000 mg | ORAL_TABLET | Freq: Every day | ORAL | 0 refills | Status: DC

## 2018-05-05 MED ORDER — KETOROLAC TROMETHAMINE 60 MG/2ML IM SOLN
30.0000 mg | Freq: Once | INTRAMUSCULAR | Status: AC
  Administered 2018-05-05: 30 mg via INTRAMUSCULAR

## 2018-05-05 NOTE — Progress Notes (Signed)
BP (!) 158/64   Pulse 84   Temp (!) 97 F (36.1 C) (Oral)   Ht 5\' 3"  (1.6 m)   Wt 237 lb 9.6 oz (107.8 kg)   BMI 42.09 kg/m    Subjective:    Patient ID: Shelly [name], female    DOB: 12-23-1940, 77 y.o.   MRN: [id number]  HPI: Shelly [name] is a 77 y.o. female presenting on 05/05/2018 for Herpes Zoster (back - Patient was seen 12/6 and wants to see if it better) and Anorexia   HPI Shingles Patient is coming in for recheck on shingles.  She was seen on 04/23/2018 for this and she still having a very significant amount of pain with this.  The pain comes around her right breast under her right axilla and around to her right upper back.  She says she did have a lot of rash and redness in the front all the way around but the rash is gone on the front she still has a rash on her back.  Relevant past medical, surgical, family and social history reviewed and updated as indicated. Interim medical history since our last visit reviewed. Allergies and medications reviewed and updated.  Review of Systems  Constitutional: Negative for chills and fever.  Eyes: Negative for visual disturbance.  Respiratory: Negative for chest tightness and shortness of breath.   Cardiovascular: Negative for chest pain and leg swelling.  Gastrointestinal: Positive for nausea.  Musculoskeletal: Negative for back pain and gait problem.  Skin: Negative for rash.  Neurological: Negative for weakness, light-headedness, numbness and headaches.  Psychiatric/Behavioral: Negative for agitation and behavioral problems.  All other systems reviewed and are negative.   Per HPI unless specifically indicated above   Allergies as of 05/05/2018      Reactions   Nsaids Other (See Comments)   AVOID all NSAID due to history of GI bleeding      Medication List       Accurate as of May 05, 2018  2:20 PM. Always use your most recent med list.        amitriptyline 50 MG tablet Commonly known as:   ELAVIL Take 1 tablet (50 mg total) by mouth at bedtime.   amLODipine 5 MG tablet Commonly known as:  NORVASC Take 1 tablet (5 mg total) by mouth daily.   atorvastatin 40 MG tablet Commonly known as:  LIPITOR TAKE ONE TABLET BY MOUTH DAILY AT 6 PM.   b complex vitamins capsule Take 1 capsule by mouth daily.   busPIRone 15 MG tablet Commonly known as:  BUSPAR Take 1 tablet (15 mg total) by mouth 3 (three) times daily.   cetirizine 10 MG tablet Commonly known as:  ZYRTEC Take 1 tablet (10 mg total) by mouth as needed.   clopidogrel 75 MG tablet Commonly known as:  PLAVIX Take 1 tablet (75 mg total) by mouth daily.   clotrimazole-betamethasone cream Commonly known as:  LOTRISONE Apply topically 2 (two) times daily. to affected area   dexlansoprazole 60 MG capsule Commonly known as:  DEXILANT Take 1 capsule (60 mg total) by mouth daily.   diclofenac sodium 1 % Gel Commonly known as:  VOLTAREN Apply 2 g topically 4 (four) times daily.   famotidine 20 MG tablet Commonly known as:  PEPCID Take 1 tablet (20 mg total) by mouth 2 (two) times daily.   fluticasone 50 MCG/ACT nasal spray Commonly known as:  FLONASE Place 1 spray into both nostrils 2 (two) times daily  as needed for allergies or rhinitis.   hydrochlorothiazide 25 MG tablet Commonly known as:  HYDRODIURIL Take 1 tablet (25 mg total) by mouth daily.   HYDROcodone-acetaminophen 5-325 MG tablet Commonly known as:  NORCO/VICODIN Take 1-2 tablets by mouth 2 (two) times daily as needed for moderate pain.   lisinopril 40 MG tablet Commonly known as:  PRINIVIL,ZESTRIL TAKE ONE TABLET BY MOUTH DAILY.   metoprolol succinate 25 MG 24 hr tablet Commonly known as:  TOPROL XL Take 1 tablet (25 mg total) by mouth daily.   multivitamin tablet Take 1 tablet by mouth daily.   neomycin-polymyxin-hydrocortisone 3.5-10000-1 OTIC suspension Commonly known as:  CORTISPORIN Place 3 drops into both ears 3 (three) times  daily.   nitroGLYCERIN 0.4 MG SL tablet Commonly known as:  NITROSTAT Place 1 tablet (0.4 mg total) under the tongue every 5 (five) minutes as needed for chest pain.   ondansetron 4 MG disintegrating tablet Commonly known as:  ZOFRAN-ODT Take 1 tablet (4 mg total) by mouth every 6 (six) hours as needed for nausea or vomiting.   ondansetron 4 MG tablet Commonly known as:  ZOFRAN Take one TID as directed( with Valtrex pill )   PROCTOZONE-HC 2.5 % rectal cream Generic drug:  hydrocortisone PLACE RECTALLY TWICE DAILY AS NEEDED   psyllium 58.6 % powder Commonly known as:  METAMUCIL Take 1 packet by mouth as needed (for constipation).   sucralfate 1 GM/10ML suspension Commonly known as:  CARAFATE Take 10 mLs (1 g total) by mouth 4 (four) times daily -  with meals and at bedtime.   triamcinolone cream 0.1 % Commonly known as:  KENALOG Apply 1 application topically 2 (two) times daily.   valACYclovir 1000 MG tablet Commonly known as:  VALTREX Take 1 tablet (1,000 mg total) by mouth 3 (three) times daily.          Objective:    BP (!) 158/64   Pulse 84   Temp (!) 97 F (36.1 C) (Oral)   Ht 5\' 3"  (1.6 m)   Wt 237 lb 9.6 oz (107.8 kg)   BMI 42.09 kg/m   Wt Readings from Last 3 Encounters:  05/05/18 237 lb 9.6 oz (107.8 kg)  04/23/18 233 lb (105.7 kg)  04/21/18 232 lb (105.2 kg)    Physical Exam Vitals signs and nursing note reviewed.  Constitutional:      General: She is not in acute distress.    Appearance: She is well-developed. She is not diaphoretic.  Musculoskeletal: Normal range of motion.        General: No tenderness.  Skin:    General: Skin is warm and dry.     Findings: Rash (Vesicular papular rash on upper back, no rash around front, pain overlying skin following the nerve pathway over right breasts in the right axilla and over the right posterior upper back.) present.  Neurological:     Mental Status: She is alert and oriented to person, place, and  time.     Coordination: Coordination normal.  Psychiatric:        Behavior: Behavior normal.        Assessment & Plan:   Problem List Items Addressed This Visit      Other   Herpes zoster without complication - Primary   Relevant Medications   ketorolac (TORADOL) injection 30 mg (Completed) (Start on 05/05/2018  2:30 PM)   amitriptyline (ELAVIL) 50 MG tablet      Patient already finished course of Valtrex and said  it made her sick to her stomach which is getting better with her other medication but she wants something that can help her with the pain that is keeping her up at night from the neuropathy. Follow up plan: No follow-ups on file.  Counseling provided for all of the vaccine components No orders of the defined types were placed in this encounter.   Caryl Pina, MD [facility] 05/05/2018, 2:20 PM

## 2018-05-10 ENCOUNTER — Other Ambulatory Visit: Payer: Self-pay | Admitting: Family Medicine

## 2018-05-10 DIAGNOSIS — Z79891 Long term (current) use of opiate analgesic: Secondary | ICD-10-CM

## 2018-05-10 DIAGNOSIS — M1712 Unilateral primary osteoarthritis, left knee: Secondary | ICD-10-CM

## 2018-05-10 DIAGNOSIS — R52 Pain, unspecified: Secondary | ICD-10-CM

## 2018-05-10 DIAGNOSIS — F1129 Opioid dependence with unspecified opioid-induced disorder: Secondary | ICD-10-CM

## 2018-05-17 ENCOUNTER — Telehealth: Payer: Self-pay | Admitting: Family Medicine

## 2018-05-17 MED ORDER — GABAPENTIN 300 MG PO CAPS: 300.0000 mg | ORAL_CAPSULE | Freq: Two times a day (BID) | ORAL | 0 refills | Status: DC

## 2018-05-17 NOTE — Telephone Encounter (Signed)
Please advise 

## 2018-05-17 NOTE — Telephone Encounter (Signed)
Pt aware new rx sent in. 

## 2018-05-17 NOTE — Telephone Encounter (Signed)
What symptoms do you have? Pt has the shingles she is having pain and when she puts her shirt on it feels like it digs in to her  How long have you been sick? 3 weeks  Have you been seen for this problem? Yes, she is taking medication but will run out needs some relief  If your provider decides to give you a prescription, which pharmacy would you like for it to be sent to? [facility]    Patient informed that this information will be sent to the clinical staff for review and that they should receive a follow up call.

## 2018-05-17 NOTE — Telephone Encounter (Signed)
Please let her know that I sent in gabapentin for her that she can try for the nerve pain and then let us know if she continues to have issues

## 2018-05-24 ENCOUNTER — Encounter: Payer: Self-pay | Admitting: Family Medicine

## 2018-05-24 ENCOUNTER — Ambulatory Visit (INDEPENDENT_AMBULATORY_CARE_PROVIDER_SITE_OTHER): Payer: Medicare Other | Admitting: Family Medicine

## 2018-05-24 ENCOUNTER — Telehealth: Payer: Self-pay

## 2018-05-24 VITALS — BP 144/76 | HR 90 | Temp 97.4°F | Ht 63.0 in | Wt 235.8 lb

## 2018-05-24 DIAGNOSIS — Z79891 Long term (current) use of opiate analgesic: Secondary | ICD-10-CM

## 2018-05-24 DIAGNOSIS — I10 Essential (primary) hypertension: Secondary | ICD-10-CM | POA: Diagnosis not present

## 2018-05-24 DIAGNOSIS — M792 Neuralgia and neuritis, unspecified: Secondary | ICD-10-CM

## 2018-05-24 DIAGNOSIS — F1129 Opioid dependence with unspecified opioid-induced disorder: Secondary | ICD-10-CM | POA: Diagnosis not present

## 2018-05-24 DIAGNOSIS — R52 Pain, unspecified: Secondary | ICD-10-CM | POA: Diagnosis not present

## 2018-05-24 DIAGNOSIS — M1712 Unilateral primary osteoarthritis, left knee: Secondary | ICD-10-CM | POA: Diagnosis not present

## 2018-05-24 MED ORDER — BUSPIRONE HCL 15 MG PO TABS: 15.0000 mg | ORAL_TABLET | Freq: Three times a day (TID) | ORAL | 1 refills | Status: DC

## 2018-05-24 MED ORDER — LISINOPRIL 40 MG PO TABS: 40.0000 mg | ORAL_TABLET | Freq: Every day | ORAL | 3 refills | Status: DC

## 2018-05-24 MED ORDER — LANSOPRAZOLE 15 MG PO CPDR: 15.0000 mg | DELAYED_RELEASE_CAPSULE | Freq: Two times a day (BID) | ORAL | 3 refills | Status: DC

## 2018-05-24 MED ORDER — HYDROCHLOROTHIAZIDE 25 MG PO TABS: 25.0000 mg | ORAL_TABLET | Freq: Every day | ORAL | 3 refills | Status: DC

## 2018-05-24 MED ORDER — ATORVASTATIN CALCIUM 40 MG PO TABS: 40.0000 mg | ORAL_TABLET | Freq: Every day | ORAL | 3 refills | Status: DC

## 2018-05-24 MED ORDER — HYDROCODONE-ACETAMINOPHEN 5-325 MG PO TABS: 1.0000 | ORAL_TABLET | Freq: Two times a day (BID) | ORAL | 0 refills | Status: DC | PRN

## 2018-05-24 MED ORDER — FAMOTIDINE 20 MG PO TABS: 20.0000 mg | ORAL_TABLET | Freq: Two times a day (BID) | ORAL | 3 refills | Status: DC

## 2018-05-24 MED ORDER — AMLODIPINE BESYLATE 5 MG PO TABS: 5.0000 mg | ORAL_TABLET | Freq: Every day | ORAL | 3 refills | Status: DC

## 2018-05-24 MED ORDER — PREDNISONE 20 MG PO TABS: ORAL_TABLET | ORAL | 0 refills | Status: DC

## 2018-05-24 MED ORDER — DEXLANSOPRAZOLE 60 MG PO CPDR: 60.0000 mg | DELAYED_RELEASE_CAPSULE | Freq: Every day | ORAL | 3 refills | Status: DC

## 2018-05-24 MED ORDER — DULOXETINE HCL 30 MG PO CPEP: ORAL_CAPSULE | ORAL | 1 refills | Status: DC

## 2018-05-24 MED ORDER — CLOPIDOGREL BISULFATE 75 MG PO TABS: 75.0000 mg | ORAL_TABLET | Freq: Every day | ORAL | 3 refills | Status: DC

## 2018-05-24 NOTE — Addendum Note (Signed)
Addended by: Caryl Pina on: 05/24/2018 04:20 PM   Modules accepted: Orders

## 2018-05-24 NOTE — Progress Notes (Signed)
BP (!) 144/76   Pulse 90   Temp (!) 97.4 F (36.3 C) (Oral)   Ht 5\' 3"  (1.6 m)   Wt 235 lb 12.8 oz (107 kg)   BMI 41.77 kg/m    Subjective:    Patient ID: Shelly [name], female    DOB: 08-Mar-1941, 78 y.o.   MRN: [id number]  HPI: Shelly [name] is a 78 y.o. female presenting on 05/24/2018 for Hypertension (6 month follow up) and Herpes Zoster (Patient was seen 12/18 and states that she is not broke out anymore but still painful)   HPI Patient is coming in today for refill of her medications including her pain medication which is helping with her neuropathy and her hypertension medications.  Patient is having a lot of neuropathic pain coming across her chest under her breast around to her right scapula where her shingles was.  The shingles is resolved but she still having a lot of burning and sharp pains through that area that is not resolving.  She did get treated for the shingles twice and the shingles rash itself is gone but the pain associated with that is still there and not improving.  She denies any pain anywhere else except for that right side going right where she had shingles.  Relevant past medical, surgical, family and social history reviewed and updated as indicated. Interim medical history since our last visit reviewed. Allergies and medications reviewed and updated.  Review of Systems  Constitutional: Negative for chills and fever.  HENT: Negative for congestion, ear discharge and ear pain.   Eyes: Negative for redness and visual disturbance.  Respiratory: Negative for chest tightness and shortness of breath.   Cardiovascular: Negative for chest pain and leg swelling.  Musculoskeletal: Negative for back pain and gait problem.  Skin: Negative for rash.  Neurological: Positive for numbness. Negative for weakness, light-headedness and headaches.  Psychiatric/Behavioral: Negative for agitation and behavioral problems.  All other systems reviewed and are  negative.   Per HPI unless specifically indicated above   Allergies as of 05/24/2018      Reactions   Nsaids Other (See Comments)   AVOID all NSAID due to history of GI bleeding      Medication List       Accurate as of May 24, 2018  4:02 PM. Always use your most recent med list.        amLODipine 5 MG tablet Commonly known as:  NORVASC Take 1 tablet (5 mg total) by mouth daily.   atorvastatin 40 MG tablet Commonly known as:  LIPITOR Take 1 tablet (40 mg total) by mouth daily at 6 PM.   b complex vitamins capsule Take 1 capsule by mouth daily.   busPIRone 15 MG tablet Commonly known as:  BUSPAR Take 1 tablet (15 mg total) by mouth 3 (three) times daily.   cetirizine 10 MG tablet Commonly known as:  ZYRTEC Take 1 tablet (10 mg total) by mouth as needed.   clopidogrel 75 MG tablet Commonly known as:  PLAVIX Take 1 tablet (75 mg total) by mouth daily.   clotrimazole-betamethasone cream Commonly known as:  LOTRISONE Apply topically 2 (two) times daily. to affected area   dexlansoprazole 60 MG capsule Commonly known as:  DEXILANT Take 1 capsule (60 mg total) by mouth daily.   diclofenac sodium 1 % Gel Commonly known as:  VOLTAREN Apply 2 g topically 4 (four) times daily.   DULoxetine 30 MG capsule Commonly known as:  CYMBALTA  Take 1 capsule a day for the first 4 days and then 2 capsules/day every day.  take at bedtime   famotidine 20 MG tablet Commonly known as:  PEPCID Take 1 tablet (20 mg total) by mouth 2 (two) times daily.   fluticasone 50 MCG/ACT nasal spray Commonly known as:  FLONASE Place 1 spray into both nostrils 2 (two) times daily as needed for allergies or rhinitis.   hydrochlorothiazide 25 MG tablet Commonly known as:  HYDRODIURIL Take 1 tablet (25 mg total) by mouth daily.   HYDROcodone-acetaminophen 5-325 MG tablet Commonly known as:  NORCO/VICODIN Take 1-2 tablets by mouth 2 (two) times daily as needed for moderate pain.    lisinopril 40 MG tablet Commonly known as:  PRINIVIL,ZESTRIL Take 1 tablet (40 mg total) by mouth daily.   metoprolol succinate 25 MG 24 hr tablet Commonly known as:  TOPROL XL Take 1 tablet (25 mg total) by mouth daily.   multivitamin tablet Take 1 tablet by mouth daily.   neomycin-polymyxin-hydrocortisone 3.5-10000-1 OTIC suspension Commonly known as:  CORTISPORIN Place 3 drops into both ears 3 (three) times daily.   nitroGLYCERIN 0.4 MG SL tablet Commonly known as:  NITROSTAT Place 1 tablet (0.4 mg total) under the tongue every 5 (five) minutes as needed for chest pain.   ondansetron 4 MG disintegrating tablet Commonly known as:  ZOFRAN-ODT Take 1 tablet (4 mg total) by mouth every 6 (six) hours as needed for nausea or vomiting.   ondansetron 4 MG tablet Commonly known as:  ZOFRAN Take one TID as directed( with Valtrex pill )   predniSONE 20 MG tablet Commonly known as:  DELTASONE 2 po at same time daily for 5 days   PROCTOZONE-HC 2.5 % rectal cream Generic drug:  hydrocortisone PLACE RECTALLY TWICE DAILY AS NEEDED   psyllium 58.6 % powder Commonly known as:  METAMUCIL Take 1 packet by mouth as needed (for constipation).   sucralfate 1 GM/10ML suspension Commonly known as:  CARAFATE Take 10 mLs (1 g total) by mouth 4 (four) times daily -  with meals and at bedtime.   triamcinolone cream 0.1 % Commonly known as:  KENALOG Apply 1 application topically 2 (two) times daily.   valACYclovir 1000 MG tablet Commonly known as:  VALTREX Take 1 tablet (1,000 mg total) by mouth 3 (three) times daily.          Objective:    BP (!) 144/76   Pulse 90   Temp (!) 97.4 F (36.3 C) (Oral)   Ht 5\' 3"  (1.6 m)   Wt 235 lb 12.8 oz (107 kg)   BMI 41.77 kg/m   Wt Readings from Last 3 Encounters:  05/24/18 235 lb 12.8 oz (107 kg)  05/05/18 237 lb 9.6 oz (107.8 kg)  04/23/18 233 lb (105.7 kg)    Physical Exam Vitals signs and nursing note reviewed.  Constitutional:       Appearance: She is well-developed. She is not diaphoretic.  Eyes:     Conjunctiva/sclera: Conjunctivae normal.  Cardiovascular:     Rate and Rhythm: Normal rate and regular rhythm.     Heart sounds: Normal heart sounds. No murmur.  Pulmonary:     Effort: Pulmonary effort is normal. No respiratory distress.     Breath sounds: Normal breath sounds. No wheezing.  Musculoskeletal: Normal range of motion.        General: Tenderness (Patient has tenderness to palpation overlying the skin to light palpation under right breast and around the back  towards her scapula reaching up towards her shoulder on that side.  Only on the right side.  No rash noted except for healing spots from where ) present.  Skin:    General: Skin is warm and dry.     Findings: No rash.  Neurological:     Mental Status: She is alert and oriented to person, place, and time.     Coordination: Coordination normal.  Psychiatric:        Behavior: Behavior normal.         Assessment & Plan:   Problem List Items Addressed This Visit      Cardiovascular and Mediastinum   Essential hypertension - Primary   Relevant Medications   amLODipine (NORVASC) 5 MG tablet   atorvastatin (LIPITOR) 40 MG tablet   hydrochlorothiazide (HYDRODIURIL) 25 MG tablet   lisinopril (PRINIVIL,ZESTRIL) 40 MG tablet     Musculoskeletal and Integument   Osteoarthritis left knee   Relevant Medications   HYDROcodone-acetaminophen (NORCO/VICODIN) 5-325 MG tablet   predniSONE (DELTASONE) 20 MG tablet    Other Visit Diagnoses    Pain management       Relevant Medications   HYDROcodone-acetaminophen (NORCO/VICODIN) 5-325 MG tablet   Opioid dependence with opioid-induced disorder (HCC)       Relevant Medications   HYDROcodone-acetaminophen (NORCO/VICODIN) 5-325 MG tablet   Chronic prescription opiate use       Relevant Medications   HYDROcodone-acetaminophen (NORCO/VICODIN) 5-325 MG tablet   Neuropathic pain       Patient has  neuropathic pain from recent shingles infection   Relevant Medications   predniSONE (DELTASONE) 20 MG tablet      Did not do well with amitrypt or gabapentin Follow up plan: Return in about 1 week (around 05/31/2018), or if symptoms worsen or fail to improve, for Recheck neuropathic pain.  Counseling provided for all of the vaccine components No orders of the defined types were placed in this encounter.   Caryl Pina, MD [facility] 05/24/2018, 4:02 PM

## 2018-05-24 NOTE — Patient Instructions (Signed)
Recommended for patient to take Cymbalta once at bedtime for 4 days and then start taking it twice at bedtime. Stop amitriptyline and gabapentin  I also sent in a short course of prednisone for her

## 2018-05-24 NOTE — Telephone Encounter (Signed)
I sent Prevacid instead

## 2018-05-24 NOTE — Telephone Encounter (Signed)
Pharmacy states Pepcid is out of stock. Please send in something else since they are not sure when they will get it back in stock.

## 2018-05-31 ENCOUNTER — Encounter: Payer: Self-pay | Admitting: Family Medicine

## 2018-05-31 ENCOUNTER — Ambulatory Visit (INDEPENDENT_AMBULATORY_CARE_PROVIDER_SITE_OTHER): Payer: Medicare Other | Admitting: Family Medicine

## 2018-05-31 VITALS — BP 135/70 | HR 90 | Temp 99.1°F | Ht 63.0 in | Wt 236.2 lb

## 2018-05-31 DIAGNOSIS — M792 Neuralgia and neuritis, unspecified: Secondary | ICD-10-CM | POA: Diagnosis not present

## 2018-05-31 DIAGNOSIS — B0229 Other postherpetic nervous system involvement: Secondary | ICD-10-CM | POA: Diagnosis not present

## 2018-05-31 NOTE — Progress Notes (Signed)
BP 135/70   Pulse 90   Temp 99.1 F (37.3 C) (Oral)   Ht 5\' 3"  (1.6 m)   Wt 236 lb 3.2 oz (107.1 kg)   BMI 41.84 kg/m    Subjective:    Patient ID: Shelly [name], female    DOB: 1940/10/31, 78 y.o.   MRN: [id number]  HPI: Shelly [name] is a 78 y.o. female presenting on 05/31/2018 for 1 week re check (Right side pain. Patient states that it has improved but she still has the pain.)   HPI Post shingles nerve pain Patient is coming in with post shingles nerve pain complaints that she says has drastically improved since last time after doing a short course of the prednisone and started on the Cymbalta.  She had tried multiple other nerve medications including amitriptyline and gabapentin without success but the Cymbalta seems to be helping with the short course of prednisone that she just finished and she says that she is doing much much better.  She still has some pain under her right axilla but most of the other pain is much improved.  Relevant past medical, surgical, family and social history reviewed and updated as indicated. Interim medical history since our last visit reviewed. Allergies and medications reviewed and updated.  Review of Systems  Constitutional: Negative for chills and fever.  Eyes: Negative for visual disturbance.  Respiratory: Negative for chest tightness and shortness of breath.   Cardiovascular: Negative for chest pain and leg swelling.  Musculoskeletal: Negative for back pain and gait problem.  Skin: Negative for rash.  Neurological: Negative for light-headedness, numbness and headaches.  Psychiatric/Behavioral: Negative for agitation and behavioral problems.  All other systems reviewed and are negative.   Per HPI unless specifically indicated above        Objective:    BP 135/70   Pulse 90   Temp 99.1 F (37.3 C) (Oral)   Ht 5\' 3"  (1.6 m)   Wt 236 lb 3.2 oz (107.1 kg)   BMI 41.84 kg/m   Wt Readings from Last 3 Encounters:  05/31/18  236 lb 3.2 oz (107.1 kg)  05/24/18 235 lb 12.8 oz (107 kg)  05/05/18 237 lb 9.6 oz (107.8 kg)    Physical Exam Vitals signs and nursing note reviewed.  Constitutional:      General: She is not in acute distress.    Appearance: She is well-developed. She is not diaphoretic.  Eyes:     Conjunctiva/sclera: Conjunctivae normal.  Cardiovascular:     Rate and Rhythm: Normal rate and regular rhythm.     Heart sounds: Normal heart sounds. No murmur.  Pulmonary:     Effort: Pulmonary effort is normal. No respiratory distress.     Breath sounds: Normal breath sounds. No wheezing.  Skin:    General: Skin is warm and dry.     Findings: No rash.  Neurological:     Mental Status: She is alert and oriented to person, place, and time.     Coordination: Coordination normal.     Comments: Right axillary nerve pain, still has scarring on her back but no new lesions are noted from the shingles.  Psychiatric:        Behavior: Behavior normal.         Assessment & Plan:   Problem List Items Addressed This Visit    None    Visit Diagnoses    Postherpetic neuralgia    -  Primary   Neuropathic pain  With short course of steroid has improved, we will continue the Cymbalta for now and if it returns or worsens we can always consider another short course of it. Follow up plan: Return in about 3 months (around 08/30/2018), or if symptoms worsen or fail to improve, for Retention cholesterol recheck.  Counseling provided for all of the vaccine components No orders of the defined types were placed in this encounter.   Caryl Pina, MD [facility] 05/31/2018, 9:25 AM

## 2018-06-09 ENCOUNTER — Encounter: Payer: Self-pay | Admitting: Gastroenterology

## 2018-06-09 ENCOUNTER — Ambulatory Visit: Payer: Medicare Other | Admitting: Gastroenterology

## 2018-06-09 ENCOUNTER — Telehealth: Payer: Self-pay | Admitting: Gastroenterology

## 2018-06-09 NOTE — Telephone Encounter (Signed)
PATIENT WAS A NO SHOW AND LETTER SENT  °

## 2018-06-14 ENCOUNTER — Telehealth: Payer: Self-pay | Admitting: *Deleted

## 2018-06-14 NOTE — Telephone Encounter (Addendum)
Patient is wanting to know if there is a cream she can use where she had shingles. She states that the area feels like sand paper and it hurts and burns. Patient also states that the duloxetine is not working well and between 2-3 every morning she wakes up in misery from where the shingles were located.

## 2018-06-14 NOTE — Telephone Encounter (Signed)
Have her use moisturizing cream and hydrocortisone cream over the counter

## 2018-06-16 NOTE — Telephone Encounter (Signed)
Aware of provider's advice. 

## 2018-06-30 ENCOUNTER — Encounter: Payer: Self-pay | Admitting: Family Medicine

## 2018-06-30 ENCOUNTER — Ambulatory Visit: Payer: Medicare Other | Admitting: Family Medicine

## 2018-06-30 VITALS — BP 121/65 | HR 88 | Temp 97.2°F | Ht 63.0 in | Wt 241.0 lb

## 2018-06-30 DIAGNOSIS — F329 Major depressive disorder, single episode, unspecified: Secondary | ICD-10-CM | POA: Diagnosis not present

## 2018-06-30 DIAGNOSIS — B0229 Other postherpetic nervous system involvement: Secondary | ICD-10-CM

## 2018-06-30 DIAGNOSIS — R4589 Other symptoms and signs involving emotional state: Secondary | ICD-10-CM

## 2018-06-30 MED ORDER — GABAPENTIN 100 MG PO CAPS: ORAL_CAPSULE | ORAL | 0 refills | Status: DC

## 2018-06-30 NOTE — Progress Notes (Signed)
Subjective: CC: Postherpetic neuralgia [id number] Shelly [name] is Shelly 78 y.o. female presenting to clinic today for: PCP: [name], [name], MD   1. Postherpetic neuralgia Patient reports persistent neuralgia.  She had shingles outbreak greater than 1 month ago.  She was also treated with prednisone in efforts to improve neuralgia.  She is currently on Cymbalta but has not noticed Shelly great improvement in her neuropathic symptoms.  She notes that the pain is so severe that it causes her mood to be depressed.  She reports that recently there was an anniversary of her daughter's death.  She has lost 2 daughters already and this weighs on her.  She notes that she lives for her grandchildren whom she takes care of.  However, she has considered in the past " taking all of her pills".  She does not want to see Shelly therapist.  She notes that she would never commit suicide.  She is very "personal and does not often open up".   ROS: Per HPI  Allergies  Allergen Reactions  . Nsaids Other (See Comments)    AVOID all NSAID due to history of GI bleeding   Past Medical History:  Diagnosis Date  . Allergy   . Anemia    GI bleed  . Anginal pain ([place])   . Anxiety   . Arthritis   . Blood transfusion without reported diagnosis   . Cataract   . Chronic kidney disease   . Coronary artery disease    SEVERE  . GERD (gastroesophageal reflux disease)   . GI bleed 12/2013  . Goiter   . Hyperlipidemia   . Hypertension   . Myocardial infarct ([place])   . Myocardial infarction ([place]) 08/2013  . Neuralgia of right lower extremity 07/13/2017  . Pancolonic diverticulosis 12/2013    Current Outpatient Medications:  .  amLODipine (NORVASC) 5 MG tablet, Take 1 tablet (5 mg total) by mouth daily., Disp: 90 tablet, Rfl: 3 .  atorvastatin (LIPITOR) 40 MG tablet, Take 1 tablet (40 mg total) by mouth daily at 6 PM., Disp: 90 tablet, Rfl: 3 .  b complex vitamins capsule, Take 1 capsule by mouth daily., Disp: , Rfl:  .   busPIRone (BUSPAR) 15 MG tablet, Take 1 tablet (15 mg total) by mouth 3 (three) times daily., Disp: 90 tablet, Rfl: 1 .  cetirizine (ZYRTEC) 10 MG tablet, Take 1 tablet (10 mg total) by mouth as needed., Disp: 90 tablet, Rfl: 0 .  clopidogrel (PLAVIX) 75 MG tablet, Take 1 tablet (75 mg total) by mouth daily., Disp: 90 tablet, Rfl: 3 .  dexlansoprazole (DEXILANT) 60 MG capsule, Take 1 capsule (60 mg total) by mouth daily., Disp: 90 capsule, Rfl: 3 .  diclofenac sodium (VOLTAREN) 1 % GEL, Apply 2 g topically 4 (four) times daily., Disp: 100 g, Rfl: 2 .  DULoxetine (CYMBALTA) 30 MG capsule, Take 1 capsule Shelly day for the first 4 days and then 2 capsules/day every day.  take at bedtime, Disp: 60 capsule, Rfl: 1 .  famotidine (PEPCID) 20 MG tablet, Take 1 tablet (20 mg total) by mouth 2 (two) times daily., Disp: 180 tablet, Rfl: 3 .  fluticasone (FLONASE) 50 MCG/ACT nasal spray, Place 1 spray into both nostrils 2 (two) times daily as needed for allergies or rhinitis., Disp: 16 g, Rfl: 6 .  hydrochlorothiazide (HYDRODIURIL) 25 MG tablet, Take 1 tablet (25 mg total) by mouth daily., Disp: 90 tablet, Rfl: 3 .  HYDROcodone-acetaminophen (NORCO/VICODIN) 5-325 MG tablet, Take  1-2 tablets by mouth 2 (two) times daily as needed for moderate pain., Disp: 60 tablet, Rfl: 0 .  lansoprazole (PREVACID) 15 MG capsule, Take 1 capsule (15 mg total) by mouth 2 (two) times daily before Shelly meal., Disp: 60 capsule, Rfl: 3 .  lisinopril (PRINIVIL,ZESTRIL) 40 MG tablet, Take 1 tablet (40 mg total) by mouth daily., Disp: 90 tablet, Rfl: 3 .  metoprolol succinate (TOPROL XL) 25 MG 24 hr tablet, Take 1 tablet (25 mg total) by mouth daily., Disp: 30 tablet, Rfl: 11 .  Multiple Vitamin (MULTIVITAMIN) tablet, Take 1 tablet by mouth daily., Disp: , Rfl:  .  neomycin-polymyxin-hydrocortisone (CORTISPORIN) 3.5-10000-1 OTIC suspension, Place 3 drops into both ears 3 (three) times daily., Disp: 10 mL, Rfl: 6 .  nitroGLYCERIN (NITROSTAT) 0.4  MG SL tablet, Place 1 tablet (0.4 mg total) under the tongue every 5 (five) minutes as needed for chest pain., Disp: 25 tablet, Rfl: 3 .  ondansetron (ZOFRAN-ODT) 4 MG disintegrating tablet, Take 1 tablet (4 mg total) by mouth every 6 (six) hours as needed for nausea or vomiting., Disp: 20 tablet, Rfl: 0 .  sucralfate (CARAFATE) 1 GM/10ML suspension, Take 10 mLs (1 g total) by mouth 4 (four) times daily -  with meals and at bedtime., Disp: 420 mL, Rfl: 0 Social History   Socioeconomic History  . Marital status: Widowed    Spouse name: Not on file  . Number of children: 8  . Years of education: 19  . Highest education level: Not on file  Occupational History  . 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 Financial resource strain: Not on file  . Food insecurity:    Worry: Not on file    Inability: Not on file  . Transportation needs:    Medical: Not on file    Non-medical: Not on file  Tobacco Use  . Smoking status: Never Smoker  . Smokeless tobacco: Never Used  Substance and Sexual Activity  . Alcohol use: No    Alcohol/week: 0.0 standard drinks  . Drug use: No  . Sexual activity: Not Currently    Comment: divorced, lives with daughter and granddaughters  Lifestyle  . Physical activity:    Days per week: Not on file    Minutes per session: Not on file  . Stress: Not on file  Relationships  . Social connections:    Talks on phone: Not on file    Gets together: Not on file    Attends religious service: Not on file    Active member of club or organization: Not on file    Attends meetings of clubs or organizations: Not on file    Relationship status: Not on file  . Intimate partner violence:    Fear of current or ex partner: Not on file    Emotionally abused: Not on file    Physically abused: Not on file    Forced sexual activity: Not on file  Other Topics Concern  . Not on file  Social History Narrative   Works in [facility] group home   Full time    Lives with daughter   Two grand daughters    One son home most of the time   Family History  Problem Relation Age of Onset  . Hypertension Brother   . Transient ischemic attack Brother   . Cancer Mother        unsure of type   . COPD Mother   .  Asthma Sister   . Stroke Sister   . Stroke Sister   . Early death Brother   . Early death Brother   . Liver cancer Daughter   . Hernia Son        Umbilical  . Pancreatic disease Daughter        pancreatectomy  . Diverticulitis Daughter   . Anemia Daughter   . GER disease Son     Objective: Office vital signs reviewed. BP 121/65   Pulse 88   Temp (!) 97.2 F (36.2 C) (Oral)   Ht 5\' 3"  (1.6 m)   Wt 241 lb (109.3 kg)   BMI 42.69 kg/m   Physical Examination:  General: Awake, alert, obese Skin: Postinflammatory hyperpigmentation noted along the right upper back.  She has quite Shelly bit of skin sensitivity along these lesions extending to the anterior aspect of the right side of the chest.  She has post inflammatory hyperpigmentation along the right axilla as well. Psych: Mood depressed.  Patient is intermittently tearful.  Does not appear to be responding to internal stimuli. Depression screen [facility] 2/9 06/30/2018 05/31/2018 05/24/2018  Decreased Interest 0 0 0  Down, Depressed, Hopeless 0 0 0  PHQ - 2 Score 0 0 0  Altered sleeping 0 - -  Tired, decreased energy 0 - -  Change in appetite 0 - -  Feeling bad or failure about yourself  0 - -  Trouble concentrating 0 - -  Moving slowly or fidgety/restless 0 - -  Suicidal thoughts 0 - -  PHQ-9 Score 0 - -   Assessment/ Plan: 78 y.o. female   1. Post herpetic neuralgia Trial of gabapentin 100 mg nightly for 1 week then may increase to twice daily.  She will follow-up with her PCP in 2 to 4 weeks for recheck.  We caution sedation.  2. Depressed mood She is very stoic but intermittently tearful during exam.  I did offer counseling services with clinical social work but she declined this.   I do not think that she is Shelly danger to herself or others but she is certainly depressed and would benefit from augmentation of her medication/counseling services.  I discussed this further today with her PCP who will address this at their next visit.  For now, continue Cymbalta.   No orders of the defined types were placed in this encounter.  Meds ordered this encounter  Medications  . gabapentin (NEURONTIN) 100 MG capsule    Sig: Take 1 capsule (100 mg total) by mouth at bedtime for 7 days, THEN 1 capsule (100 mg total) 2 (two) times daily for 21 days.    Dispense:  49 capsule    Refill:  0   Total time spent with patient 25 minutes.  Greater than 50% of encounter spent in coordination of care/counseling.   Janora Norlander, DO [place] [phone]

## 2018-06-30 NOTE — Patient Instructions (Signed)
I have sent in gabapentin.  Take 1 capsule every night at bedtime for 1 week.  If you find that your symptoms continue during the daytime, you can start taking 1 capsule in the morning and 1 capsule at nighttime.  Follow-up with Dr. Warrick Parisian in the next 2 to 4 weeks for recheck.  If we need to increase your dose we can do it at that time.   Postherpetic Neuralgia Postherpetic neuralgia (PHN) is nerve pain that occurs after a shingles infection. Shingles is a painful rash that appears on one area of the body, usually on the trunk or face. Shingles is caused by the varicella-zoster virus. This is the same virus that causes chickenpox. In people who have had chickenpox, the virus can resurface years later and cause shingles. You may have PHN if you continue to have pain for 4 months after your shingles rash has gone away. PHN appears in the same area where you had the shingles rash. The pain usually goes away after the rash disappears. Getting a vaccination for shingles can prevent PHN. This vaccine is recommended for people older than 60. It may prevent shingles, and may also lower your risk of PHN if you do get shingles. What are the causes? This condition is caused by damage to your nerves from the varicella-zoster virus. The damage makes your nerves overly sensitive. What increases the risk? The following factors may make you more likely to develop this condition:  Being older than 77 years of age.  Having severe pain before your shingles rash starts.  Having a severe rash.  Having shingles in and around the eye area.  Having a disease that makes your body unable to fight infections (weak immune system). What are the signs or symptoms? The main symptom of this condition is pain. The pain may:  Often be very bad and may be described as stabbing, burning, or feeling like an electric shock.  Come and go or may be there all the time.  Be triggered by light touches on the skin or changes in  temperature. You may have itching along with the pain. How is this diagnosed? This condition may be diagnosed based on your symptoms and your history of shingles. Lab studies and other diagnostic tests are usually not needed. How is this treated? There is no cure for this condition. Treatment for PHN will focus on pain relief. Over-the-counter pain relievers do not usually relieve PHN pain. You may need to work with a pain specialist. Treatment may include:  Antidepressant medicines to help with pain and improve sleep.  Anti-seizure medicines to relieve nerve pain.  Strong pain relievers (opioids).  A numbing patch worn on the skin (lidocaine patch).  Botox (botulinum toxin) injections to block pain signals between nerves and muscles.  Injections of numbing medicine or anti-inflammatory medicines around irritated nerves. Follow these instructions at home:   It may take a long time to recover from PHN. Work closely with your health care provider and develop a good support system at home.  Take over-the-counter and prescription medicines only as told by your health care provider.  Do not drive or use heavy machinery while taking prescription pain medicine.  Wear loose, comfortable clothing.  Cover sensitive areas with a dressing to reduce friction from clothing rubbing on the area.  If directed, put ice on the painful area: ? Put ice in a plastic bag. ? Place a towel between your skin and the bag. ? Leave the ice on for  20 minutes, 2-3 times a day.  Talk to your health care provider if you feel depressed or desperate. Living with long-term pain can be depressing.  Keep all follow-up visits as told by your health care provider. This is important. Contact a health care provider if:  Your medicine is not helping.  You are struggling to manage your pain at home. Summary  Postherpetic neuralgia is a very painful disorder that can occur after an episode of shingles.  The pain  is often severe, burning, electric, or stabbing.  Prescription medicines can be helpful in managing persistent pain.  Getting a vaccination for shingles can prevent PHN. This vaccine is recommended for people older than 60. This information is not intended to replace advice given to you by your health care provider. Make sure you discuss any questions you have with your health care provider. Document Released: 07/26/2002 Document Revised: 07/22/2016 Document Reviewed: 07/22/2016 Elsevier Interactive Patient Education  2019 [employer].

## 2018-07-10 IMAGING — DX DG ABDOMEN ACUTE W/ 1V CHEST
4 series · 4 of 4 positions shown · non-contrast
Comparison: 09/01/2015

CLINICAL DATA: GI bleeding.

EXAM:
DG ABDOMEN ACUTE W/ 1V CHEST

[chest pa]
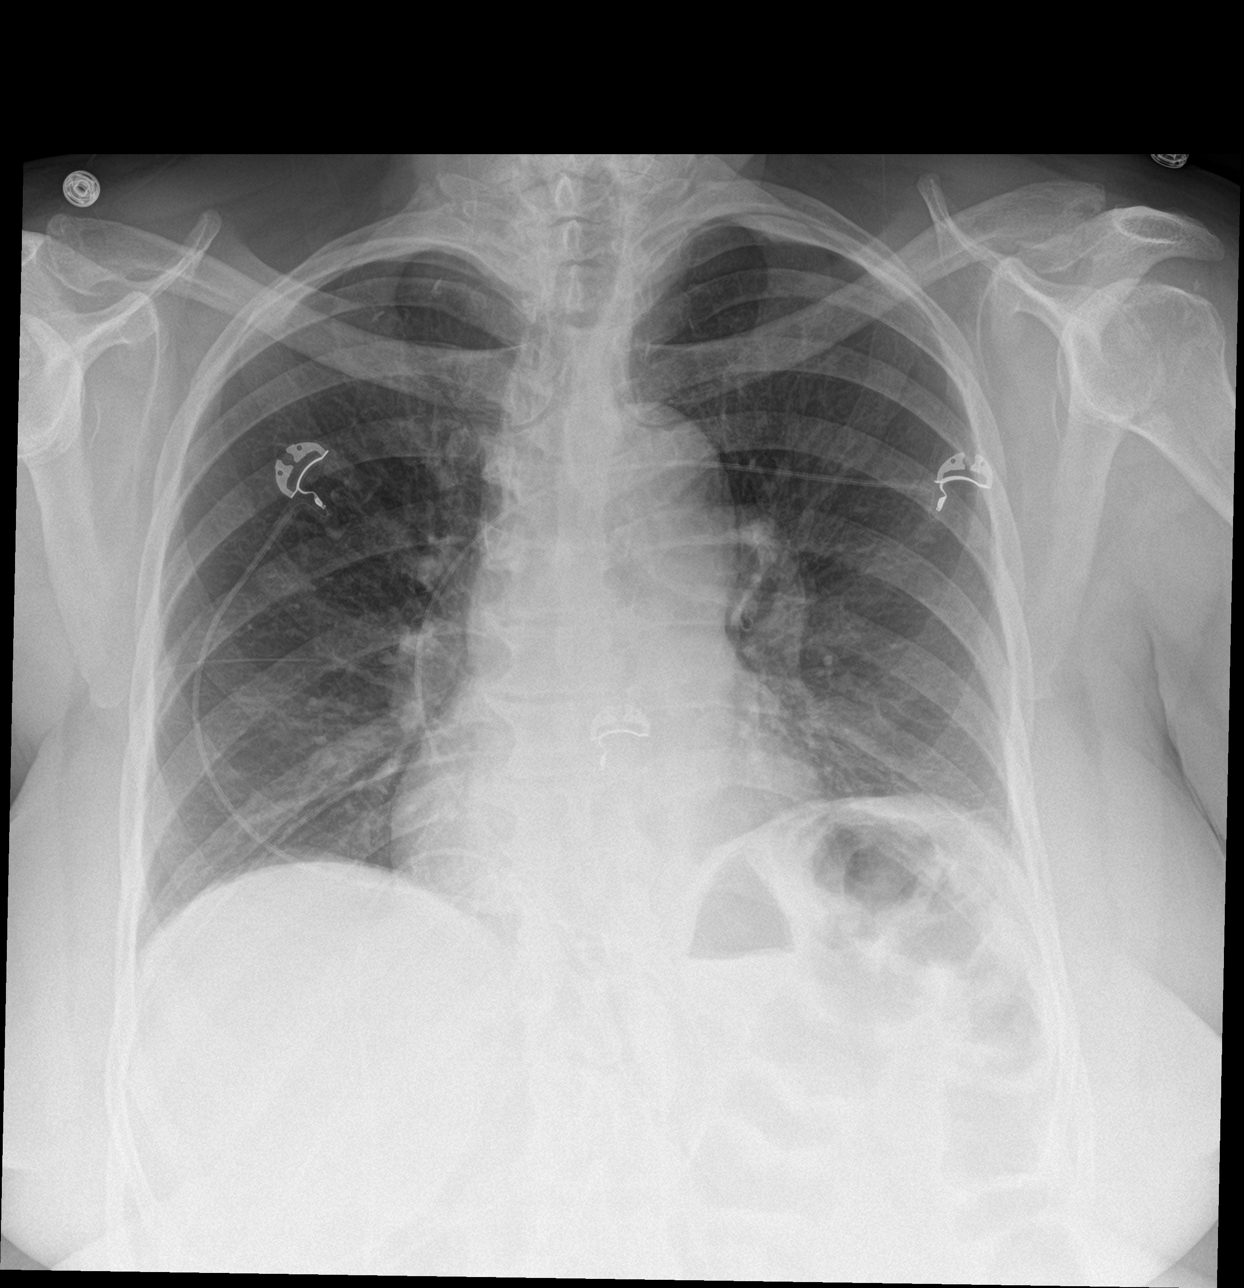

[abdomen erect]
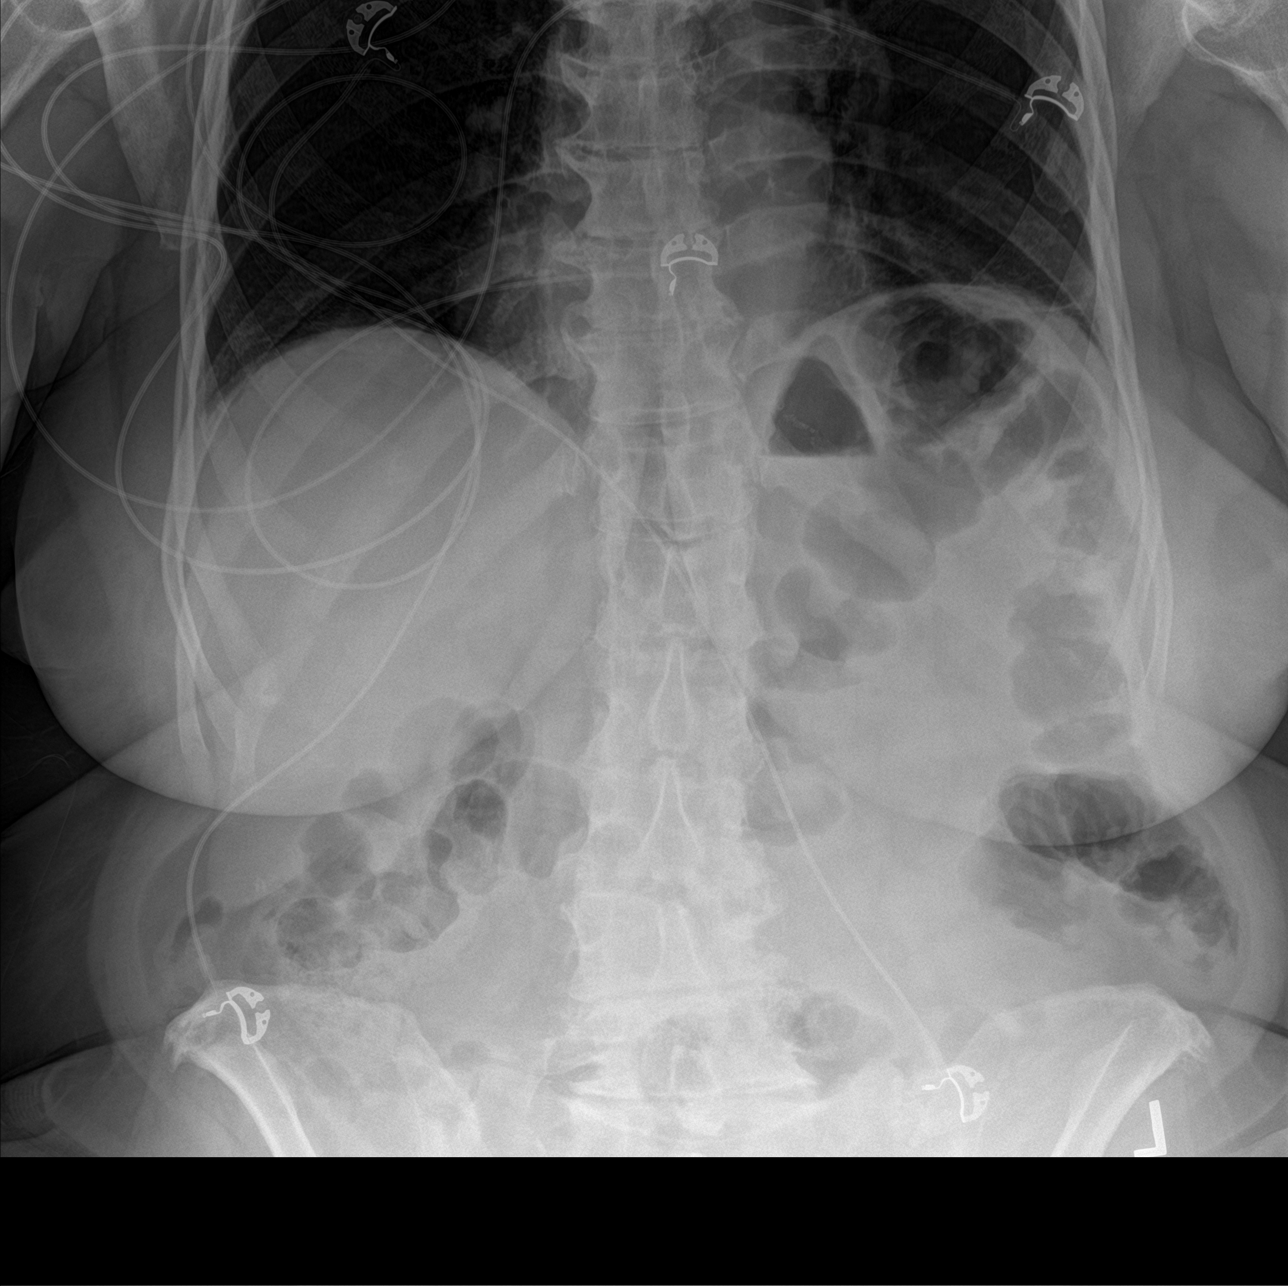

[abdomen supine (1 of 2)]
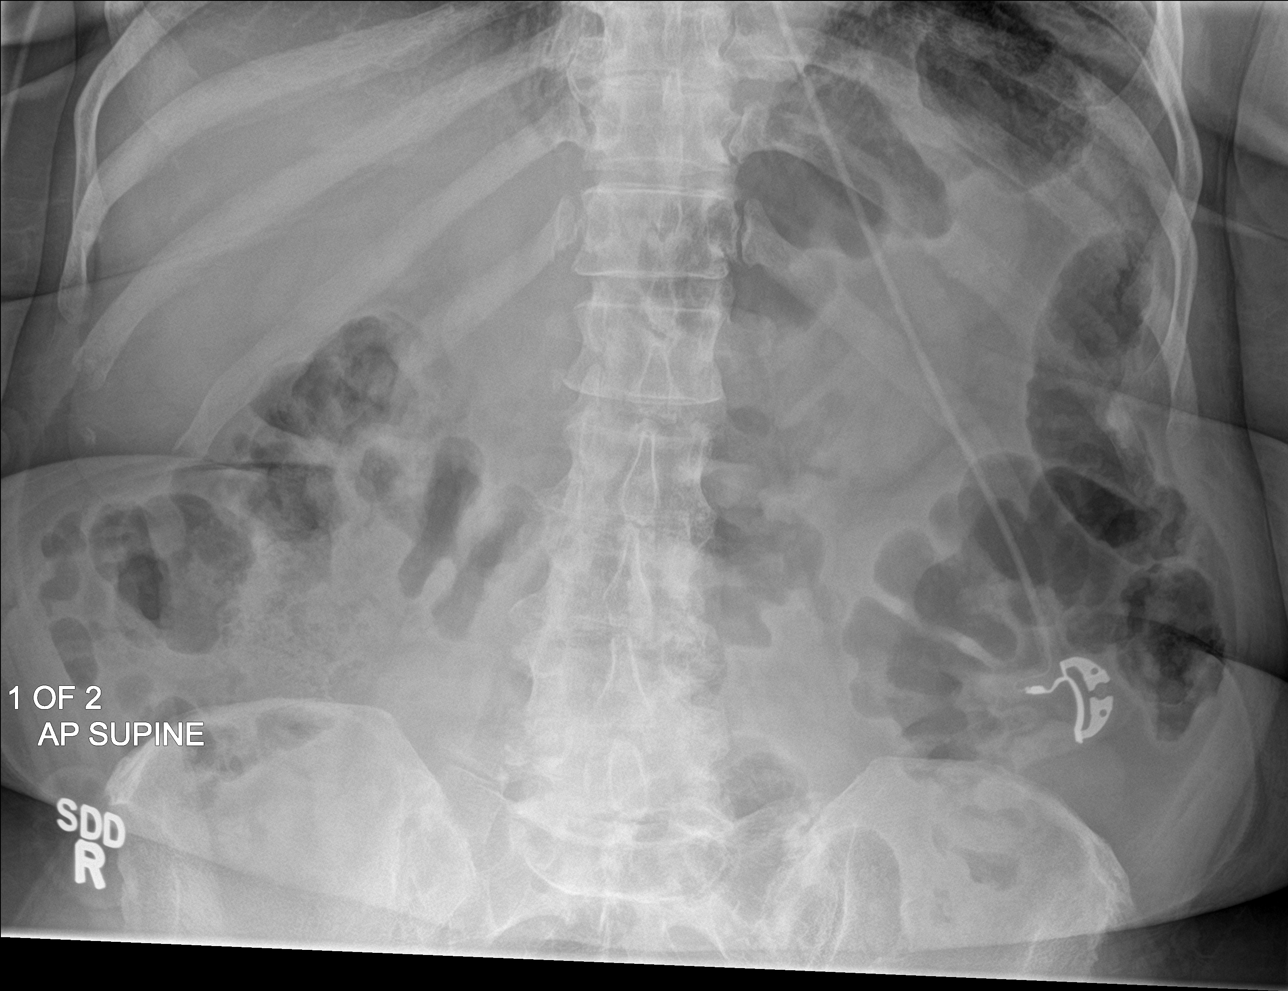

[abdomen supine (2 of 2)]
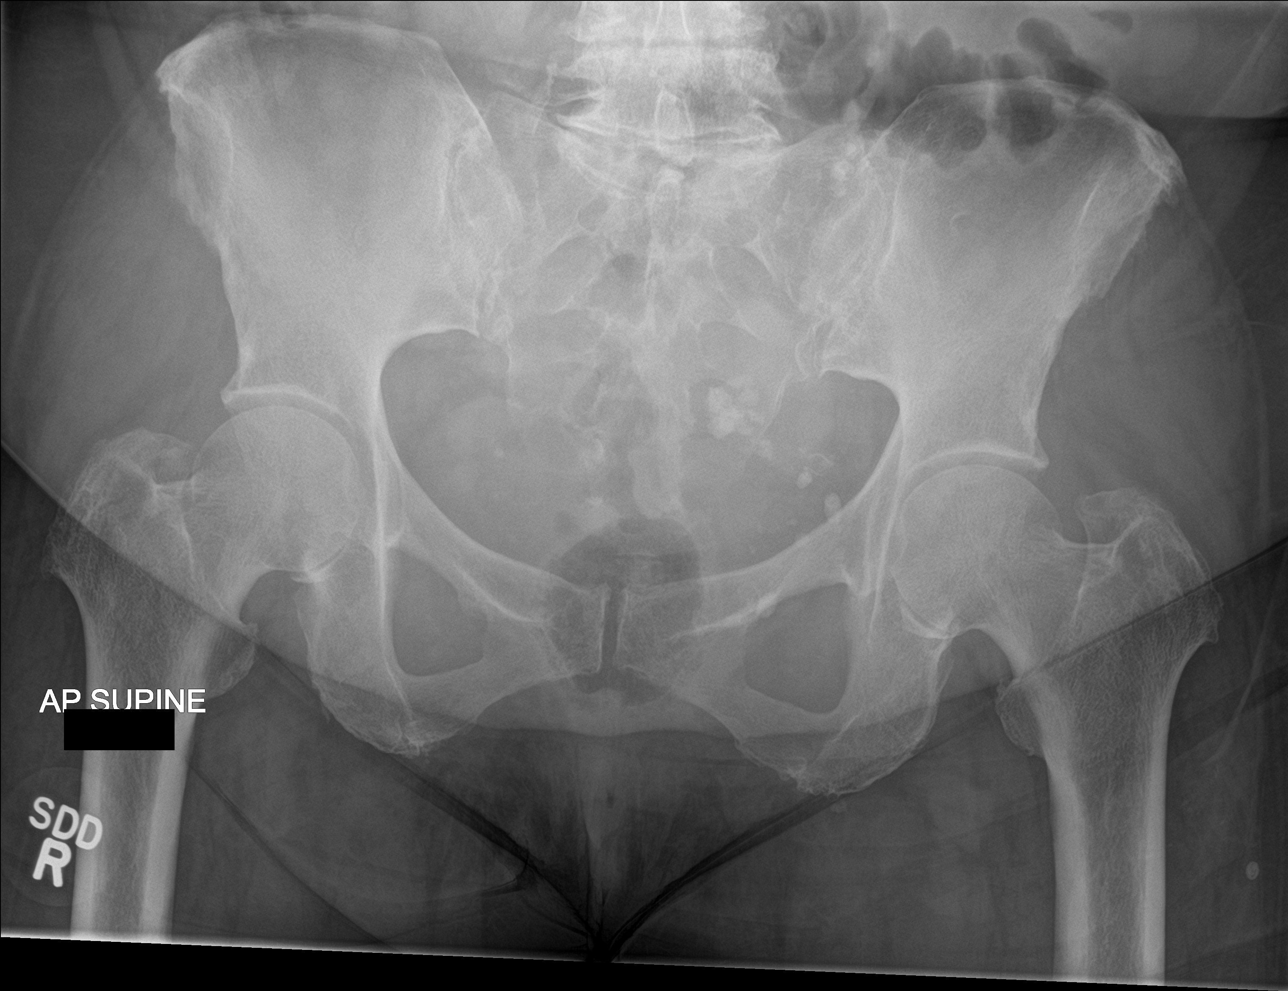

[4 of 4 positions shown; findings below may reference images not displayed]

FINDINGS: Left base atelectasis. Right lung is clear. Heart is normal size. No
effusions.

Nonobstructive bowel gas pattern. Gas throughout nondistended large
bowel. No free air organomegaly. Calcified phleboliths and vascular
calcifications in the pelvis.
IMPRESSION: No evidence of bowel obstruction or free air.

Left base atelectasis.

## 2018-07-14 ENCOUNTER — Telehealth: Payer: Self-pay | Admitting: Cardiovascular Disease

## 2018-07-14 ENCOUNTER — Telehealth: Payer: Self-pay | Admitting: Family Medicine

## 2018-07-14 DIAGNOSIS — B0223 Postherpetic polyneuropathy: Secondary | ICD-10-CM | POA: Diagnosis not present

## 2018-07-14 NOTE — Telephone Encounter (Signed)
Line busy

## 2018-07-14 NOTE — Telephone Encounter (Signed)
She can use capsaicin cream like Dr. Lajuana Ripple said or if she does have lidocaine cream she can use that as well it is very difficult to get lidocaine covered but if she has not already that she can use it.

## 2018-07-14 NOTE — Telephone Encounter (Signed)
She is supposed to see Dr [name] again to recheck pain.  She can take 1 capsule of Gabapentin during the day and 1 at night if needed.  Would recommend capsaicin cream applied to the affected areas if she is looking for a topical.

## 2018-07-14 NOTE — Telephone Encounter (Signed)
Patient called stating that she has had shingles for several months now with pain to the right side. Went to see her PCP and was put on Lidocaine cream 4%. She is wanting to know if she can use this medication with her heart medications.

## 2018-07-15 NOTE — Telephone Encounter (Signed)
That is fine 

## 2018-07-15 NOTE — Telephone Encounter (Signed)
Patient notified and verbalized understanding. 

## 2018-07-21 MED ORDER — CAPSAICIN-MENTHOL-METHYL SAL 0.025-1-12 % EX CREA: 1.0000 "application " | TOPICAL_CREAM | Freq: Four times a day (QID) | CUTANEOUS | 5 refills | Status: DC | PRN

## 2018-07-21 NOTE — Telephone Encounter (Signed)
Please let the patient know that I have sent in capsaicin cream for her and she can try that. Caryl Pina, MD [facility] 07/21/2018, 1:07 PM

## 2018-07-21 NOTE — Telephone Encounter (Signed)
Patient aware and verbalizes understanding. 

## 2018-07-21 NOTE — Telephone Encounter (Signed)
Please send in something for this shingles pain.  Lidocaine cream was not helpful.  Still suffering.  Have not had any other creams or pain medication.

## 2018-07-26 DIAGNOSIS — R208 Other disturbances of skin sensation: Secondary | ICD-10-CM | POA: Diagnosis not present

## 2018-08-02 ENCOUNTER — Other Ambulatory Visit: Payer: Self-pay

## 2018-08-02 ENCOUNTER — Ambulatory Visit (INDEPENDENT_AMBULATORY_CARE_PROVIDER_SITE_OTHER): Payer: Medicare Other

## 2018-08-02 ENCOUNTER — Telehealth: Payer: Self-pay | Admitting: *Deleted

## 2018-08-02 ENCOUNTER — Ambulatory Visit (INDEPENDENT_AMBULATORY_CARE_PROVIDER_SITE_OTHER): Payer: Medicare Other | Admitting: *Deleted

## 2018-08-02 VITALS — BP 118/72 | HR 87 | Temp 98.3°F | Ht 63.0 in | Wt 236.0 lb

## 2018-08-02 DIAGNOSIS — Z13820 Encounter for screening for osteoporosis: Secondary | ICD-10-CM

## 2018-08-02 DIAGNOSIS — Z0001 Encounter for general adult medical examination with abnormal findings: Secondary | ICD-10-CM

## 2018-08-02 DIAGNOSIS — Z780 Asymptomatic menopausal state: Secondary | ICD-10-CM | POA: Diagnosis not present

## 2018-08-02 DIAGNOSIS — Z0000 Encounter for general adult medical examination without abnormal findings: Secondary | ICD-10-CM

## 2018-08-02 MED ORDER — PREGABALIN 75 MG PO CAPS: 75.0000 mg | ORAL_CAPSULE | Freq: Two times a day (BID) | ORAL | 2 refills | Status: DC

## 2018-08-02 MED ORDER — DEXLANSOPRAZOLE 30 MG PO CPDR: 30.0000 mg | DELAYED_RELEASE_CAPSULE | Freq: Every day | ORAL | 2 refills | Status: DC

## 2018-08-02 MED ORDER — CLOTRIMAZOLE-BETAMETHASONE 1-0.05 % EX CREA: 1.0000 "application " | TOPICAL_CREAM | Freq: Two times a day (BID) | CUTANEOUS | 1 refills | Status: DC

## 2018-08-02 NOTE — Telephone Encounter (Signed)
I sent in [place] and Lyrica for her, have her stop the gabapentin now and start the Lyrica

## 2018-08-02 NOTE — Progress Notes (Signed)
Subjective:   RHANDI DESPAIN is a 78 y.o. female who presents for Medicare Annual (Subsequent) preventive examination. 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 She does not participate in any exercise and states that her diet is not the best. She is somewhat active in church. She lives with her daughter and 2 of her grandchildren. She had 8 children, 2 have since passed away. She has one outside dog and fall hazards were discussed today.   Cardiac Risk Factors include: hypertension;dyslipidemia     Objective:     Vitals: BP 118/72 (BP Location: Left Arm)   Pulse 87   Temp 98.3 F (36.8 C) (Oral)   Ht 5\' 3"  (1.6 m)   Wt 236 lb (107 kg)   BMI 41.81 kg/m   Body mass index is 41.81 kg/m.  Advanced Directives 08/02/2018 11/18/2016 10/02/2016 04/04/2016 04/03/2016 12/09/2015 12/09/2015  Does Patient Have a Medical Advance Directive? Yes Yes No Yes No;Yes Yes Yes  Type of Advance Directive Living will;Healthcare Power of Attorney Out of facility DNR (pink MOST or yellow form) Out of facility DNR (pink MOST or yellow form) [place];Living will [place];Living will [place] -  Does patient want to make changes to medical advance directive? - No - Patient declined - No - Patient declined - - -  Copy of [place] in Chart? No - copy requested - - No - copy requested No - copy requested No - copy requested -  Would patient like information on creating a medical advance directive? - - Yes (MAU/Ambulatory/Procedural Areas - Information given) - - - -  Pre-existing out of facility DNR order (yellow form or pink MOST form) - Yellow form placed in chart (order not valid for inpatient use) Yellow form placed in chart (order not valid for inpatient use) - - - -    Tobacco Social History   Tobacco Use  Smoking Status Never Smoker  Smokeless Tobacco Never Used     Counseling given:  Not Answered   Past Medical History:  Diagnosis Date  . Allergy   . Anemia    GI bleed  . Anginal pain ([place])   . Anxiety   . Arthritis   . Blood transfusion without reported diagnosis   . Cataract   . Chronic kidney disease   . Coronary artery disease    SEVERE  . GERD (gastroesophageal reflux disease)   . GI bleed 12/2013  . Goiter   . Hyperlipidemia   . Hypertension   . Myocardial infarct ([place])   . Myocardial infarction ([place]) 08/2013  . Neuralgia of right lower extremity 07/13/2017  . Pancolonic diverticulosis 12/2013   Past Surgical History:  Procedure Laterality Date  . COLONOSCOPY N/A 01/13/2014   Dr. Hung:pandiverticulosis   . COLONOSCOPY N/A 12/14/2015   Dr. Michail Sermon: pancolonic diverticulosis, internal hemorrhoids   . CORNEAL TRANSPLANT    . CORONARY ANGIOPLASTY  08/2013  . ESOPHAGOGASTRODUODENOSCOPY N/A 12/14/2015   Dr. Michail Sermon: normal   . GIVENS CAPSULE STUDY N/A 08/13/2016   Dr. Oneida Alar: enteritis due to aspirin.   Marland Kitchen HEEL SPUR EXCISION    . LEFT HEART CATHETERIZATION WITH CORONARY ANGIOGRAM N/A 08/23/2013   Procedure: LEFT HEART CATHETERIZATION WITH CORONARY ANGIOGRAM;  Surgeon: Peter M Martinique, MD;  Location: [facility] CATH LAB;  Service: Cardiovascular;  Laterality: N/A;  . PERCUTANEOUS CORONARY STENT INTERVENTION (PCI-S) N/A 08/24/2013   Procedure: PERCUTANEOUS  CORONARY STENT INTERVENTION (PCI-S);  Surgeon: Peter M Martinique, MD;  Location[facility] CATH LAB;  Service: Cardiovascular;  Laterality: N/A;  . TONSILLECTOMY    . TUBAL LIGATION     Family History  Problem Relation Age of Onset  . Hypertension Brother   . Transient ischemic attack Brother   . Cancer Mother        unsure of type   . Other Father        grangrene   . Asthma Sister   . Stroke Sister   . COPD Sister   . Stroke Sister   . Early death Brother   . Early death Brother   . Liver cancer Daughter   . Cancer Daughter   . Hernia Son        Umbilical  . GI problems Son   . Pancreatic disease Daughter         pancreatectomy  . Cancer Daughter   . Diverticulitis Daughter   . Arthritis Daughter        knee   . Arthritis Daughter        shoulder   . Anemia Daughter   . GER disease Son    Social History   Socioeconomic History  . Marital status: Widowed    Spouse name: Not on file  . Number of children: 8  . Years of education: 53  . Highest education level: Not on file  Occupational History  . 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 Financial resource strain: Not on file  . Food insecurity:    Worry: Not on file    Inability: Not on file  . Transportation needs:    Medical: Not on file    Non-medical: Not on file  Tobacco Use  . Smoking status: Never Smoker  . Smokeless tobacco: Never Used  Substance and Sexual Activity  . Alcohol use: No    Alcohol/week: 0.0 standard drinks  . Drug use: No  . Sexual activity: Not Currently    Comment: divorced, lives with daughter and granddaughters  Lifestyle  . Physical activity:    Days per week: Not on file    Minutes per session: Not on file  . Stress: Not on file  Relationships  . Social connections:    Talks on phone: Not on file    Gets together: Not on file    Attends religious service: Not on file    Active member of club or organization: Not on file    Attends meetings of clubs or organizations: Not on file    Relationship status: Not on file  Other Topics Concern  . Not on file  Social History Narrative   Works in a group home   Lives with daughter   Two grand daughters    One son home most of the time    Outpatient Encounter Medications as of 08/02/2018  Medication Sig  . amLODipine (NORVASC) 5 MG tablet Take 1 tablet (5 mg total) by mouth daily.  Marland Kitchen atorvastatin (LIPITOR) 40 MG tablet Take 1 tablet (40 mg total) by mouth daily at 6 PM.  . b complex vitamins capsule Take 1 capsule by mouth daily.  . clopidogrel (PLAVIX) 75 MG tablet Take 1 tablet (75 mg total) by mouth daily.  Marland Kitchen  gabapentin (NEURONTIN) 100 MG capsule Take 1 capsule (100 mg total) by mouth at bedtime for 7 days, THEN 1 capsule (100 mg total) 2 (two) times daily for  21 days.  Marland Kitchen HYDROcodone-acetaminophen (NORCO/VICODIN) 5-325 MG tablet Take 1-2 tablets by mouth 2 (two) times daily as needed for moderate pain.  Marland Kitchen lisinopril (PRINIVIL,ZESTRIL) 40 MG tablet Take 1 tablet (40 mg total) by mouth daily.  . metoprolol succinate (TOPROL XL) 25 MG 24 hr tablet Take 1 tablet (25 mg total) by mouth daily.  . Multiple Vitamin (MULTIVITAMIN) tablet Take 1 tablet by mouth daily.  . clotrimazole-betamethasone (LOTRISONE) cream Apply 1 application topically 2 (two) times daily.  . nitroGLYCERIN (NITROSTAT) 0.4 MG SL tablet Place 1 tablet (0.4 mg total) under the tongue every 5 (five) minutes as needed for chest pain. (Patient not taking: Reported on 08/02/2018)  . [DISCONTINUED] busPIRone (BUSPAR) 15 MG tablet Take 1 tablet (15 mg total) by mouth 3 (three) times daily.  . [DISCONTINUED] Capsaicin-Menthol-Methyl Sal (CAPSAICIN-METHYL SAL-MENTHOL) 0.025-1-12 % CREA Apply 1 application topically 4 (four) times daily as needed.  . [DISCONTINUED] cetirizine (ZYRTEC) 10 MG tablet Take 1 tablet (10 mg total) by mouth as needed.  . [DISCONTINUED] dexlansoprazole (DEXILANT) 60 MG capsule Take 1 capsule (60 mg total) by mouth daily.  . [DISCONTINUED] diclofenac sodium (VOLTAREN) 1 % GEL Apply 2 g topically 4 (four) times daily.  . [DISCONTINUED] DULoxetine (CYMBALTA) 30 MG capsule Take 1 capsule a day for the first 4 days and then 2 capsules/day every day.  take at bedtime  . [DISCONTINUED] famotidine (PEPCID) 20 MG tablet Take 1 tablet (20 mg total) by mouth 2 (two) times daily.  . [DISCONTINUED] fluticasone (FLONASE) 50 MCG/ACT nasal spray Place 1 spray into both nostrils 2 (two) times daily as needed for allergies or rhinitis.  . [DISCONTINUED] hydrochlorothiazide (HYDRODIURIL) 25 MG tablet Take 1 tablet (25 mg total) by mouth daily.   . [DISCONTINUED] lansoprazole (PREVACID) 15 MG capsule Take 1 capsule (15 mg total) by mouth 2 (two) times daily before a meal.  . [DISCONTINUED] neomycin-polymyxin-hydrocortisone (CORTISPORIN) 3.5-10000-1 OTIC suspension Place 3 drops into both ears 3 (three) times daily.  . [DISCONTINUED] ondansetron (ZOFRAN-ODT) 4 MG disintegrating tablet Take 1 tablet (4 mg total) by mouth every 6 (six) hours as needed for nausea or vomiting.  . [DISCONTINUED] sucralfate (CARAFATE) 1 GM/10ML suspension Take 10 mLs (1 g total) by mouth 4 (four) times daily -  with meals and at bedtime.   No facility-administered encounter medications on file as of 08/02/2018.     Activities of Daily Living In your present state of health, do you have any difficulty performing the following activities: 08/02/2018  Hearing? N  Vision? N  Difficulty concentrating or making decisions? N  Walking or climbing stairs? Y  Dressing or bathing? N  Doing errands, shopping? Y  Preparing Food and eating ? N  Using the Toilet? N  In the past six months, have you accidently leaked urine? N  Do you have problems with loss of bowel control? N  Managing your Medications? N  Managing your Finances? N  Housekeeping or managing your Housekeeping? N  Some recent data might be hidden    Patient Care Team: [name], [name], MD as PCP - General (Family Medicine) Herminio Commons, MD as PCP - Cardiology (Cardiology) Danie Binder, MD as Consulting Physician (Gastroenterology)    Assessment:   This is a routine wellness examination for Gerarda.  Exercise Activities and Dietary recommendations Current Exercise Habits: The patient does not participate in regular exercise at present, Exercise limited by: None identified  Goals    . Blood Pressure < 140/90    .  Exercise 3x per week (30 min per time)     Obese, recommend starting a routine exercise program by doing chair exercises at least 3 days a week for 30-45 minutes at a time  as tolerated.      . Weight (lb) < 200 lb (90.7 kg)       Fall Risk Fall Risk  08/02/2018 06/30/2018 04/23/2018 03/17/2018 03/05/2018  Falls in the past year? 0 0 0 No No  Number falls in past yr: - - - - -  Injury with Fall? - - - - -   Is the patient's home free of loose throw rugs in walkways, pet beds, electrical cords, etc?  Fall hazards and risks were discussed today.  Depression Screen PHQ 2/9 Scores 08/02/2018 06/30/2018 05/31/2018 05/24/2018  PHQ - 2 Score 1 0 0 0  PHQ- 9 Score - 0 - -     Cognitive Function MMSE - Mini Mental State Exam 08/02/2018  Orientation to time 4  Orientation to Place 5  Registration 3  Attention/ Calculation 4  Recall 3  Language- name 2 objects 2  Language- repeat 1  Language- follow 3 step command 1  Language- read & follow direction 1  Write a sentence 1  Copy design 1  Total score 26        Immunization History  Administered Date(s) Administered  . Pneumococcal Conjugate-13 10/02/2016  . Pneumococcal Polysaccharide-23 08/25/2013    Qualifies for Shingles Vaccine? Declined   Screening Tests Health Maintenance  Topic Date Due  . INFLUENZA VACCINE  09/06/2018 (Originally 12/17/2017)  . TETANUS/TDAP  10/03/2026 (Originally 06/14/1959)  . DEXA SCAN  Completed  . PNA vac Low Risk Adult  Completed    Cancer Screenings: Lung: Low Dose CT Chest recommended if Age 61-80 years, 30 pack-year currently smoking OR have quit w/in 15years. Patient does not qualify. Breast:  Up to date on Mammogram? No   Up to date of Bone Density/Dexa? Yes Colorectal: needs at next OV   Additional Screenings: declined  Hepatitis C Screening:      Plan:   she is to keep follow up with Dr [name] and other specialist.  She will stay active and try not to fall. She is still having some issues with recent shingles, and there will be a message sent to Dr Arcola Jansky for this.   I have personally reviewed and noted the following in the patient's chart:   .  Medical and social history . Use of alcohol, tobacco or illicit drugs  . Current medications and supplements . Functional ability and status . Nutritional status . Physical activity . Advanced directives . List of other physicians . Hospitalizations, surgeries, and ER visits in previous 12 months . Vitals . Screenings to include cognitive, depression, and falls . Referrals and appointments  In addition, I have reviewed and discussed with patient certain preventive protocols, quality metrics, and best practice recommendations. A written personalized care plan for preventive services as well as general preventive health recommendations were provided to patient.     Huntley Dec, [place]  1/[id number]/[id number]

## 2018-08-02 NOTE — Telephone Encounter (Signed)
Dr [name],  I done a AWV today on this pt and she wanted me to run 2 things by you:  1= She has been off dexilant for awhile now and thinks she needs it again. Heartburn is worse.  2= She had shingles recently and sores are all gone, but her pain is still bad. All on the right side upper back and near shoulder. She was on the gabapentin and has been tapering this down as directed. Currently on 100 mg BID. Not helping much at all - even the higher dose did not help much.  She also has a cream called Lidocaine Plus that she is using- not much help either... what else can she do or try?

## 2018-08-02 NOTE — Patient Instructions (Signed)
  Shelly [name] , Thank you for taking time to come for your Medicare Wellness Visit. I appreciate your ongoing commitment to your health goals. Please review the following plan we discussed and let me know if I can assist you in the future.   These are the goals we discussed: Goals    . Blood Pressure < 140/90    . Exercise 3x per week (30 min per time)     Obese, recommend starting a routine exercise program by doing chair exercises at least 3 days a week for 30-45 minutes at a time as tolerated.      . Weight (lb) < 200 lb (90.7 kg)       This is a list of the screening recommended for you and due dates:  Health Maintenance  Topic Date Due  . Flu Shot  09/06/2018*  . Tetanus Vaccine  10/03/2026*  . DEXA scan (bone density measurement)  Completed  . Pneumonia vaccines  Completed  *Topic was postponed. The date shown is not the original due date.    Keep follow up with Dr Warrick Parisian and other specialist  You are due for a DEXA (bone test) - we will do this today

## 2018-08-02 NOTE — Telephone Encounter (Signed)
Aware -jhb  

## 2018-08-05 ENCOUNTER — Telehealth: Payer: Self-pay | Admitting: Cardiovascular Disease

## 2018-08-05 NOTE — Telephone Encounter (Signed)
Patient has been rescheduled to Wednesday, 10/13/2018 at 3:00 with [name] in [place] office.

## 2018-08-05 NOTE — Telephone Encounter (Signed)
I called and spoke with the patient.  She is doing well from a cardiac perspective.  She told me she currently has shingles.  Given the [place] pandemic, she is very agreeable to rescheduling her appointment so as to avoid unnecessary exposure.

## 2018-08-06 ENCOUNTER — Telehealth: Payer: Self-pay | Admitting: Gastroenterology

## 2018-08-06 NOTE — Telephone Encounter (Signed)
Spoke with pt. We haven't received any papers from [place] pt assistance. Pt assistance forms and samples were left up front for pickup by pt.

## 2018-08-06 NOTE — Telephone Encounter (Signed)
[phone]  Patient called to see if we received the paper work for her dexilant prescription.  Also wants some samples if we have any.Marland Kitchen

## 2018-08-09 ENCOUNTER — Ambulatory Visit: Payer: Medicare Other | Admitting: Cardiovascular Disease

## 2018-08-16 ENCOUNTER — Telehealth: Payer: Self-pay | Admitting: Family Medicine

## 2018-08-16 NOTE — Telephone Encounter (Signed)
Pt states she read the side effects of the Lyrica and was scared to take it. Advised pt all medications have side effects. The Lyrica should help with the nerve pain from the shingles. Advised pt to take it as directed and to monitor for any side effects. If she develops any stop taking and call us back. Pt voiced understanding.

## 2018-08-17 NOTE — Progress Notes (Signed)
Primary Care Physician:  [name], [name], MD  Primary GI: Dr. Oneida Alar   Virtual Visit via Telephone Note Due to COVID-19, visit is conducted virtually and was requested by patient.   I connected with Shelly [name] on 08/18/18 at  8:00 AM EDT by telephone and verified that I am speaking with the correct person using two identifiers.   I discussed the limitations, risks, security and privacy concerns of performing an evaluation and management service by telephone and the availability of in person appointments. I also discussed with the patient that there may be a patient responsible charge related to this service. The patient expressed understanding and agreed to proceed.  Chief Complaint  Patient presents with  . Gastroesophageal Reflux    Dexilant 60 mg, doing ok     History of Present Illness: Shelly [name] is a 78 year old female with a history of GI bleeding, inpatient in 04-27-2016. EGD and colonoscopy July 2017 by Dr. Michail Sermon with normal EGD and colonoscopy showing pancolonic diverticulosis. Presumed diverticular bleed at that time. 04-27-16 readmitted with suspected diverticular bleed in setting of oral diclofenac.Due to IDA, capsule study was completed. This showed enteritis due to aspirin use.History of parenteral iron in July 2018. Needs updated iron studies.   She was last seen in Oct 2019 for routine follow-up. Chronic GERD has historically been managed well with Dexilant. She likes to have frequent visits to keep in touch with her health care team.   Diagnosed with shingles the end of 2023/04/28. Dealing with post-herpetic neuralgia. Feels miserable. Several medication changes worsened nausea. Ran out of Dexilant. Needs paperwork signed by me to fax to patient assistance. She feels down. No thoughts of hurting herself. Unable to work currently. Daughter passed away 04/27/18. Dexilant controls GERD symptoms. Wants to return in 3 months.   Past Medical History:   Diagnosis Date  . Allergy   . Anemia    GI bleed  . Anginal pain ([place])   . Anxiety   . Arthritis   . Blood transfusion without reported diagnosis   . Cataract   . Chronic kidney disease   . Coronary artery disease    SEVERE  . GERD (gastroesophageal reflux disease)   . GI bleed 12/2013  . Goiter   . Hyperlipidemia   . Hypertension   . Myocardial infarct ([place])   . Myocardial infarction ([place]) 08/2013  . Neuralgia of right lower extremity 07/13/2017  . Pancolonic diverticulosis 12/2013     Past Surgical History:  Procedure Laterality Date  . COLONOSCOPY N/A 01/13/2014   Dr. Hung:pandiverticulosis   . COLONOSCOPY N/A 12/14/2015   Dr. Michail Sermon: pancolonic diverticulosis, internal hemorrhoids   . CORNEAL TRANSPLANT    . CORONARY ANGIOPLASTY  08/2013  . ESOPHAGOGASTRODUODENOSCOPY N/A 12/14/2015   Dr. Michail Sermon: normal   . GIVENS CAPSULE STUDY N/A 08/13/2016   Dr. Oneida Alar: enteritis due to aspirin.   Marland Kitchen HEEL SPUR EXCISION    . LEFT HEART CATHETERIZATION WITH CORONARY ANGIOGRAM N/A 08/23/2013   Procedure: LEFT HEART CATHETERIZATION WITH CORONARY ANGIOGRAM;  Surgeon: Peter M Martinique, MD;  Location: [facility] CATH LAB;  Service: Cardiovascular;  Laterality: N/A;  . PERCUTANEOUS CORONARY STENT INTERVENTION (PCI-S) N/A 08/24/2013   Procedure: PERCUTANEOUS CORONARY STENT INTERVENTION (PCI-S);  Surgeon: Peter M Martinique, MD;  Location[facility] CATH LAB;  Service: Cardiovascular;  Laterality: N/A;  . TONSILLECTOMY    . TUBAL LIGATION       Current Meds  Medication Sig  .  amLODipine (NORVASC) 5 MG tablet Take 1 tablet (5 mg total) by mouth daily.  Marland Kitchen atorvastatin (LIPITOR) 40 MG tablet Take 1 tablet (40 mg total) by mouth daily at 6 PM.  . b complex vitamins capsule Take 1 capsule by mouth daily.  . clotrimazole-betamethasone (LOTRISONE) cream Apply 1 application topically 2 (two) times daily.  Marland Kitchen Dexlansoprazole (DEXILANT) 30 MG capsule Take 1 capsule (30 mg total) by mouth daily.  Marland Kitchen HYDROcodone-acetaminophen  (NORCO/VICODIN) 5-325 MG tablet Take 1-2 tablets by mouth 2 (two) times daily as needed for moderate pain.  Marland Kitchen lisinopril (PRINIVIL,ZESTRIL) 40 MG tablet Take 1 tablet (40 mg total) by mouth daily.  . metoprolol succinate (TOPROL XL) 25 MG 24 hr tablet Take 1 tablet (25 mg total) by mouth daily.  . Multiple Vitamin (MULTIVITAMIN) tablet Take 1 tablet by mouth daily.  . nitroGLYCERIN (NITROSTAT) 0.4 MG SL tablet Place 1 tablet (0.4 mg total) under the tongue every 5 (five) minutes as needed for chest pain.  . pregabalin (LYRICA) 75 MG capsule Take 1 capsule (75 mg total) by mouth 2 (two) times daily.       Observations/Objective: No distress. Unable to perform physical exam due to telephone encounter. No video available.   Assessment and Plan: 78 year old female with chronic GERD, doing well on Dexilant daily. Situational depression noted following death of her daughter from cancer. I recommended counseling services, but she is declining this currently. No suicidal or homicidal ideation. She identifies friends who are supportive.   Follow Up Instructions: Continue Dexilant daily Return in 3 months.    I discussed the assessment and treatment plan with the patient. The patient was provided an opportunity to ask questions and all were answered. The patient agreed with the plan and demonstrated an understanding of the instructions.   The patient was advised to call back or seek an in-person evaluation if the symptoms worsen or if the condition fails to improve as anticipated.  I provided  20 minutes of non-face-to-face time during this encounter.  Annitta Needs, PhD, ANP-BC [facility] Gastroenterology

## 2018-08-18 ENCOUNTER — Encounter: Payer: Self-pay | Admitting: Gastroenterology

## 2018-08-18 ENCOUNTER — Ambulatory Visit (INDEPENDENT_AMBULATORY_CARE_PROVIDER_SITE_OTHER): Payer: Medicare Other | Admitting: Gastroenterology

## 2018-08-18 ENCOUNTER — Telehealth: Payer: Medicare Other | Admitting: Gastroenterology

## 2018-08-18 ENCOUNTER — Other Ambulatory Visit: Payer: Self-pay

## 2018-08-18 DIAGNOSIS — K219 Gastro-esophageal reflux disease without esophagitis: Secondary | ICD-10-CM

## 2018-08-18 NOTE — Patient Instructions (Signed)
Continue Dexilant once daily.  I will see you in 3 months!  Please call if you need anything!  I will be thinking of you during this time!  I enjoyed seeing you again today! As you know, I value our relationship and want to provide genuine, compassionate, and quality care. I welcome your feedback. If you receive a survey regarding your visit,  I greatly appreciate you taking time to fill this out. See you next time!  Annitta Needs, PhD, ANP-BC [facility] Gastroenterology

## 2018-08-19 ENCOUNTER — Telehealth: Payer: Self-pay

## 2018-08-19 NOTE — Telephone Encounter (Signed)
Pt called to ask for Dexilant samples that were suppose to be given by AB after her telephone visit. Pt also said her granddaughter brought papers by today for pt assistance.   Samples of Dexilant were given to pt's granddaughter.  Manuela Schwartz- can you look to see if papers were given by pt's granddaughter?

## 2018-08-23 NOTE — Telephone Encounter (Signed)
Noted, will send off papers.

## 2018-08-23 NOTE — Telephone Encounter (Signed)
Papers are laying in [place] chair

## 2018-08-26 ENCOUNTER — Other Ambulatory Visit: Payer: Self-pay

## 2018-08-30 ENCOUNTER — Other Ambulatory Visit: Payer: Self-pay

## 2018-08-30 ENCOUNTER — Encounter: Payer: Self-pay | Admitting: Family Medicine

## 2018-08-30 ENCOUNTER — Ambulatory Visit (INDEPENDENT_AMBULATORY_CARE_PROVIDER_SITE_OTHER): Payer: Medicare Other | Admitting: Family Medicine

## 2018-08-30 VITALS — BP 131/70 | HR 77 | Temp 98.9°F | Ht 63.0 in | Wt 238.6 lb

## 2018-08-30 DIAGNOSIS — M1712 Unilateral primary osteoarthritis, left knee: Secondary | ICD-10-CM | POA: Diagnosis not present

## 2018-08-30 DIAGNOSIS — Z79891 Long term (current) use of opiate analgesic: Secondary | ICD-10-CM | POA: Diagnosis not present

## 2018-08-30 DIAGNOSIS — N183 Chronic kidney disease, stage 3 unspecified: Secondary | ICD-10-CM

## 2018-08-30 DIAGNOSIS — B0229 Other postherpetic nervous system involvement: Secondary | ICD-10-CM

## 2018-08-30 DIAGNOSIS — R52 Pain, unspecified: Secondary | ICD-10-CM

## 2018-08-30 DIAGNOSIS — I10 Essential (primary) hypertension: Secondary | ICD-10-CM | POA: Diagnosis not present

## 2018-08-30 DIAGNOSIS — F1129 Opioid dependence with unspecified opioid-induced disorder: Secondary | ICD-10-CM

## 2018-08-30 DIAGNOSIS — K219 Gastro-esophageal reflux disease without esophagitis: Secondary | ICD-10-CM

## 2018-08-30 MED ORDER — METHYLPREDNISOLONE ACETATE 80 MG/ML IJ SUSP
80.0000 mg | Freq: Once | INTRAMUSCULAR | Status: AC
  Administered 2018-08-30: 80 mg via INTRAMUSCULAR

## 2018-08-30 MED ORDER — HYDROCODONE-ACETAMINOPHEN 5-325 MG PO TABS: 1.0000 | ORAL_TABLET | Freq: Two times a day (BID) | ORAL | 0 refills | Status: DC | PRN

## 2018-08-30 MED ORDER — LIDOCAINE 5 % EX OINT: 1.0000 "application " | TOPICAL_OINTMENT | CUTANEOUS | 1 refills | Status: DC | PRN

## 2018-08-30 NOTE — Progress Notes (Signed)
BP 131/70    Pulse 77    Temp 98.9 F (37.2 C) (Oral)    Ht 5' 3" (1.6 m)    Wt 238 lb 9.6 oz (108.2 kg)    BMI 42.27 kg/m    Subjective:   Patient ID: Shelly [name], female    DOB: 17-Oct-1940, 78 y.o.   MRN: [id number]  HPI: Shelly [name] is a 78 y.o. female presenting on 08/30/2018 for Hypertension (3 month follow up) and Rash (right side - Patient states it has been there since Nov)   HPI Hypertension Patient is currently on amlodipine and lisinopril and metoprolol, and their blood pressure today is 70. Patient denies any lightheadedness or dizziness. Patient denies headaches, blurred vision, chest pains, shortness of breath, or weakness. Denies any side effects from medication and is content with current medication.   GERD Patient is currently on [place].  She denies any major symptoms or abdominal pain or belching or burping. She denies any blood in her stool or lightheadedness or dizziness.   Postherpetic neuralgia from right back under right axilla to right lower breast, not improved, rash is gone but pain is still there and it is described as a burning sensation.  Is been going on for about 4-1/2 months now.  She has been trying gabapentin first and now Lyrica and she says she can only take Lyrica in the evenings because of side effects.  She is also tried Cymbalta at one point as well.  We will try and do lidocaine gel.  Patient has CKD that is mild and is stage II-III and we will recheck today.  Patient has -0.9 which gives her an essentially normal bone scan recommended calcium and vitamin D  Relevant past medical, surgical, family and social history reviewed and updated as indicated. Interim medical history since our last visit reviewed. Allergies and medications reviewed and updated.  Review of Systems  Constitutional: Negative for chills and fever.  HENT: Negative for congestion, ear discharge and ear pain.   Eyes: Negative for redness and visual disturbance.    Respiratory: Negative for chest tightness and shortness of breath.   Cardiovascular: Negative for chest pain and leg swelling.  Musculoskeletal: Positive for arthralgias. Negative for back pain and gait problem.  Skin: Positive for rash. Negative for color change.  Neurological: Positive for numbness. Negative for weakness, light-headedness and headaches.  Psychiatric/Behavioral: Negative for agitation and behavioral problems.  All other systems reviewed and are negative.   Per HPI unless specifically indicated above   Allergies as of 08/30/2018      Reactions   Nsaids Other (See Comments)   AVOID all NSAID due to history of GI bleeding      Medication List       Accurate as of August 30, 2018  8:36 AM. Always use your most recent med list.        amLODipine 5 MG tablet Commonly known as:  NORVASC Take 1 tablet (5 mg total) by mouth daily.   atorvastatin 40 MG tablet Commonly known as:  LIPITOR Take 1 tablet (40 mg total) by mouth daily at 6 PM.   b complex vitamins capsule Take 1 capsule by mouth daily.   clotrimazole-betamethasone cream Commonly known as:  Lotrisone Apply 1 application topically 2 (two) times daily.   Dexlansoprazole 30 MG capsule Commonly known as:  Dexilant Take 1 capsule (30 mg total) by mouth daily.   HYDROcodone-acetaminophen 5-325 MG tablet Commonly known as:  NORCO/VICODIN  Take 1-2 tablets by mouth 2 (two) times daily as needed for moderate pain.   lidocaine 5 % ointment Commonly known as:  XYLOCAINE Apply 1 application topically as needed.   lisinopril 40 MG tablet Commonly known as:  PRINIVIL,ZESTRIL Take 1 tablet (40 mg total) by mouth daily.   metoprolol succinate 25 MG 24 hr tablet Commonly known as:  Toprol XL Take 1 tablet (25 mg total) by mouth daily.   multivitamin tablet Take 1 tablet by mouth daily.   nitroGLYCERIN 0.4 MG SL tablet Commonly known as:  NITROSTAT Place 1 tablet (0.4 mg total) under the tongue every 5  (five) minutes as needed for chest pain.   pregabalin 75 MG capsule Commonly known as:  Lyrica Take 1 capsule (75 mg total) by mouth 2 (two) times daily.        Objective:   BP 131/70    Pulse 77    Temp 98.9 F (37.2 C) (Oral)    Ht 5' 3" (1.6 m)    Wt 238 lb 9.6 oz (108.2 kg)    BMI 42.27 kg/m   Wt Readings from Last 3 Encounters:  08/30/18 238 lb 9.6 oz (108.2 kg)  08/02/18 236 lb (107 kg)  06/30/18 241 lb (109.3 kg)    Physical Exam Vitals signs and nursing note reviewed.  Constitutional:      General: She is not in acute distress.    Appearance: She is well-developed. She is not diaphoretic.  Eyes:     Conjunctiva/sclera: Conjunctivae normal.  Cardiovascular:     Rate and Rhythm: Normal rate and regular rhythm.     Heart sounds: Normal heart sounds. No murmur.  Pulmonary:     Effort: Pulmonary effort is normal. No respiratory distress.     Breath sounds: Normal breath sounds. No wheezing.  Musculoskeletal: Normal range of motion.     Left knee: She exhibits no swelling and no erythema. Tenderness found. Medial joint line and lateral joint line tenderness noted.  Skin:    General: Skin is warm and dry.     Findings: Rash (Rash is almost completely healed on her back in the midthoracic area but still has pain to light palpation along that dermatome on the right side.  Going to under her breast.) present.  Neurological:     Mental Status: She is alert and oriented to person, place, and time.     Coordination: Coordination normal.  Psychiatric:        Behavior: Behavior normal.     Left knee injection: Consent form signed. Risk factors of bleeding and infection discussed with patient and patient is agreeable towards injection. Patient prepped with Betadine. Lateral approach towards injection used. Injected 80 mg of Depo-Medrol and 1 mL of 2% lidocaine. Patient tolerated procedure well and no side effects from noted. Minimal to no bleeding. Simple bandage applied after.     Assessment & Plan:   Problem List Items Addressed This Visit      Cardiovascular and Mediastinum   Essential hypertension - Primary   Relevant Orders   Lipid panel (Completed)     Digestive   GERD (gastroesophageal reflux disease)   Relevant Orders   CBC with Differential/Platelet (Completed)     Musculoskeletal and Integument   Osteoarthritis left knee   Relevant Medications   HYDROcodone-acetaminophen (NORCO/VICODIN) 5-325 MG tablet   methylPREDNISolone acetate (DEPO-MEDROL) injection 80 mg (Completed)     Genitourinary   CKD (chronic kidney disease), stage III ([place])   Relevant  Orders   CBC with Differential/Platelet (Completed)   CMP14+EGFR (Completed)   Lipid panel (Completed)    Other Visit Diagnoses    Pain management       Relevant Medications   HYDROcodone-acetaminophen (NORCO/VICODIN) 5-325 MG tablet   Opioid dependence with opioid-induced disorder (HCC)       Relevant Medications   HYDROcodone-acetaminophen (NORCO/VICODIN) 5-325 MG tablet   Chronic prescription opiate use       Relevant Medications   HYDROcodone-acetaminophen (NORCO/VICODIN) 5-325 MG tablet   Postherpetic neuralgia       Relevant Medications   lidocaine (XYLOCAINE) 5 % ointment       Follow up plan: Return in about 4 months (around 12/30/2018), or if symptoms worsen or fail to improve, for Hypertension and kidney disease recheck.  Counseling provided for all of the vaccine components Orders Placed This Encounter  Procedures   CBC with Differential/Platelet   CMP14+EGFR   Lipid panel    Caryl Pina, MD [facility] 08/30/2018, 8:36 AM

## 2018-08-31 LAB — CBC WITH DIFFERENTIAL/PLATELET
Basophils Absolute: 0 10*3/uL (ref 0.0–0.2)
Basos: 1 %
EOS (ABSOLUTE): 0.1 10*3/uL (ref 0.0–0.4)
Eos: 1 %
Hematocrit: 35.4 % (ref 34.0–46.6)
Hemoglobin: 11.4 g/dL (ref 11.1–15.9)
Immature Grans (Abs): 0 10*3/uL (ref 0.0–0.1)
Immature Granulocytes: 0 %
Lymphocytes Absolute: 2.4 10*3/uL (ref 0.7–3.1)
Lymphs: 35 %
MCH: 31.2 pg (ref 26.6–33.0)
MCHC: 32.2 g/dL (ref 31.5–35.7)
MCV: 97 fL (ref 79–97)
Monocytes Absolute: 0.7 10*3/uL (ref 0.1–0.9)
Monocytes: 9 %
Neutrophils Absolute: 3.7 10*3/uL (ref 1.4–7.0)
Neutrophils: 54 %
Platelets: 281 10*3/uL (ref 150–450)
RBC: 3.65 x10E6/uL — ABNORMAL LOW (ref 3.77–5.28)
RDW: 12.5 % (ref 11.7–15.4)
WBC: 6.9 10*3/uL (ref 3.4–10.8)

## 2018-08-31 LAB — CMP14+EGFR
ALT: 14 IU/L (ref 0–32)
AST: 13 IU/L (ref 0–40)
Albumin/Globulin Ratio: 1.3 (ref 1.2–2.2)
Albumin: 3.8 g/dL (ref 3.7–4.7)
Alkaline Phosphatase: 92 IU/L (ref 39–117)
BUN/Creatinine Ratio: 17 (ref 12–28)
BUN: 22 mg/dL (ref 8–27)
Bilirubin Total: 0.3 mg/dL (ref 0.0–1.2)
CO2: 21 mmol/L (ref 20–29)
Calcium: 9.8 mg/dL (ref 8.7–10.3)
Chloride: 108 mmol/L — ABNORMAL HIGH (ref 96–106)
Creatinine, Ser: 1.3 mg/dL — ABNORMAL HIGH (ref 0.57–1.00)
GFR calc Af Amer: 46 mL/min/{1.73_m2} — ABNORMAL LOW (ref >59)
GFR calc non Af Amer: 40 mL/min/{1.73_m2} — ABNORMAL LOW (ref >59)
Globulin, Total: 2.9 g/dL (ref 1.5–4.5)
Glucose: 105 mg/dL — ABNORMAL HIGH (ref 65–99)
Potassium: 4.6 mmol/L (ref 3.5–5.2)
Sodium: 143 mmol/L (ref 134–144)
Total Protein: 6.7 g/dL (ref 6.0–8.5)

## 2018-08-31 LAB — LIPID PANEL
Chol/HDL Ratio: 3.4 ratio (ref 0.0–4.4)
Cholesterol, Total: 164 mg/dL (ref 100–199)
HDL: 48 mg/dL (ref >39)
LDL Calculated: 100 mg/dL — ABNORMAL HIGH (ref 0–99)
Triglycerides: 79 mg/dL (ref 0–149)
VLDL Cholesterol Cal: 16 mg/dL (ref 5–40)

## 2018-09-06 NOTE — Telephone Encounter (Signed)
Patient called stating help at hand has not received the paperwork back yet.

## 2018-09-06 NOTE — Telephone Encounter (Signed)
Spoke with pt. Paper were submitted and will call company 09/07/18.

## 2018-09-07 NOTE — Telephone Encounter (Signed)
Spoke with pt assistance and re faxed paper work needed. Pt is aware.

## 2018-09-17 ENCOUNTER — Ambulatory Visit (INDEPENDENT_AMBULATORY_CARE_PROVIDER_SITE_OTHER): Payer: Medicare Other | Admitting: Family

## 2018-09-17 ENCOUNTER — Encounter: Payer: Self-pay | Admitting: Family

## 2018-09-17 ENCOUNTER — Other Ambulatory Visit: Payer: Self-pay

## 2018-09-17 VITALS — BP 124/70 | HR 74 | Temp 98.0°F | Ht 63.0 in | Wt 233.6 lb

## 2018-09-17 DIAGNOSIS — B0229 Other postherpetic nervous system involvement: Secondary | ICD-10-CM

## 2018-09-17 DIAGNOSIS — Z8619 Personal history of other infectious and parasitic diseases: Secondary | ICD-10-CM

## 2018-09-17 MED ORDER — PREGABALIN 100 MG PO CAPS: 100.0000 mg | ORAL_CAPSULE | Freq: Two times a day (BID) | ORAL | 1 refills | Status: DC

## 2018-09-17 NOTE — Progress Notes (Signed)
   Subjective:    Patient ID: Shelly [name], female    DOB: 04/23/1941, 78 y.o.   MRN: [id number]  Chief Complaint  Patient presents with  . painful shingles on right shoulder    HPI PT presents to the office today with right side pain. She states she had shingles in Nov 2019 and since then she has had intermittent sharp pain of 9 out 10 under her skin. She states she can not wear a bra related to the pain.   She was given lidocaine cream with mild relief. She takes Norco 5-325 mg for knee pain, but states this does not help with the pain.   States rubbing the area makes it feel better.    Review of Systems  Musculoskeletal: Positive for arthralgias.  All other systems reviewed and are negative.      Objective:   Physical Exam Vitals signs reviewed.  Constitutional:      General: She is not in acute distress.    Appearance: She is well-developed.  Neck:     Musculoskeletal: Normal range of motion and neck supple.     Thyroid: No thyromegaly.  Cardiovascular:     Rate and Rhythm: Normal rate and regular rhythm.     Heart sounds: Normal heart sounds. No murmur.  Pulmonary:     Effort: Pulmonary effort is normal. No respiratory distress.     Breath sounds: Normal breath sounds. No wheezing.  Abdominal:     General: Bowel sounds are normal. There is no distension.     Palpations: Abdomen is soft.     Tenderness: There is no abdominal tenderness.  Musculoskeletal: Normal range of motion.        General: No tenderness.  Skin:    General: Skin is warm and dry.     Findings: No rash.     Comments: No rash noted  Neurological:     Mental Status: She is alert and oriented to person, place, and time.     Cranial Nerves: No cranial nerve deficit.     Deep Tendon Reflexes: Reflexes are normal and symmetric.  Psychiatric:        Behavior: Behavior normal.        Thought Content: Thought content normal.        Judgment: Judgment normal.     BP 124/70   Pulse 74   Temp  98 F (36.7 C) (Oral)   Ht 5\' 3"  (1.6 m)   Wt 233 lb 9.6 oz (106 kg)   BMI 41.38 kg/m        Assessment & Plan:  Shelly [name] comes in today with chief complaint of painful shingles on right shoulder   Diagnosis and orders addressed:  1. History of shingles - pregabalin (LYRICA) 100 MG capsule; Take 1 capsule (100 mg total) by mouth 2 (two) times daily.  Dispense: 60 capsule; Refill: 1  2. Postherpetic neuralgia PT has tried multiple medications with no relief  Will increase Lyrica to 100 mg from 75 mg PCP is out of office today, but when he returns I will discuss that patient wants a referral. She has follow up with him within the next few weeks Wear loose fitting clothes Keep follow up with PCP - pregabalin (LYRICA) 100 MG capsule; Take 1 capsule (100 mg total) by mouth 2 (two) times daily.  Dispense: 60 capsule; Refill: [place], FNP

## 2018-09-17 NOTE — Patient Instructions (Signed)
Postherpetic Neuralgia  Postherpetic neuralgia (PHN) is nerve pain that occurs after a shingles infection. Shingles is a painful rash that appears on one area of the body, usually on the trunk or face. Shingles is caused by the varicella-zoster virus. This is the same virus that causes chickenpox. In people who have had chickenpox, the virus can resurface years later and cause shingles.  You may have PHN if you continue to have pain for 4 months after your shingles rash has gone away. PHN appears in the same area where you had the shingles rash. The pain usually goes away after the rash disappears.  Getting a vaccination for shingles can prevent PHN. This vaccine is recommended for people older than 60. It may prevent shingles, and may also lower your risk of PHN if you do get shingles.  What are the causes?  This condition is caused by damage to your nerves from the varicella-zoster virus. The damage makes your nerves overly sensitive.  What increases the risk?  The following factors may make you more likely to develop this condition:   Being older than 77 years of age.   Having severe pain before your shingles rash starts.   Having a severe rash.   Having shingles in and around the eye area.   Having a disease that makes your body unable to fight infections (weak immune system).  What are the signs or symptoms?  The main symptom of this condition is pain. The pain may:   Often be very bad and may be described as stabbing, burning, or feeling like an electric shock.   Come and go or may be there all the time.   Be triggered by light touches on the skin or changes in temperature.  You may have itching along with the pain.  How is this diagnosed?  This condition may be diagnosed based on your symptoms and your history of shingles. Lab studies and other diagnostic tests are usually not needed.  How is this treated?  There is no cure for this condition. Treatment for PHN will focus on pain relief.  Over-the-counter pain relievers do not usually relieve PHN pain. You may need to work with a pain specialist. Treatment may include:   Antidepressant medicines to help with pain and improve sleep.   Anti-seizure medicines to relieve nerve pain.   Strong pain relievers (opioids).   A numbing patch worn on the skin (lidocaine patch).   Botox (botulinum toxin) injections to block pain signals between nerves and muscles.   Injections of numbing medicine or anti-inflammatory medicines around irritated nerves.  Follow these instructions at home:     It may take a long time to recover from PHN. Work closely with your health care provider and develop a good support system at home.   Take over-the-counter and prescription medicines only as told by your health care provider.   Do not drive or use heavy machinery while taking prescription pain medicine.   Wear loose, comfortable clothing.   Cover sensitive areas with a dressing to reduce friction from clothing rubbing on the area.   If directed, put ice on the painful area:  ? Put ice in a plastic bag.  ? Place a towel between your skin and the bag.  ? Leave the ice on for 20 minutes, 2-3 times a day.   Talk to your health care provider if you feel depressed or desperate. Living with long-term pain can be depressing.   Keep all follow-up visits   as told by your health care provider. This is important.  Contact a health care provider if:   Your medicine is not helping.   You are struggling to manage your pain at home.  Summary   Postherpetic neuralgia is a very painful disorder that can occur after an episode of shingles.   The pain is often severe, burning, electric, or stabbing.   Prescription medicines can be helpful in managing persistent pain.   Getting a vaccination for shingles can prevent PHN. This vaccine is recommended for people older than 60.  This information is not intended to replace advice given to you by your health care provider. Make sure  you discuss any questions you have with your health care provider.  Document Released: 07/26/2002 Document Revised: 07/22/2016 Document Reviewed: 07/22/2016  [employer]vier Interactive Patient Education  2019 [employer]

## 2018-09-18 ENCOUNTER — Other Ambulatory Visit: Payer: Self-pay | Admitting: Family Medicine

## 2018-09-18 DIAGNOSIS — B0229 Other postherpetic nervous system involvement: Secondary | ICD-10-CM

## 2018-09-28 ENCOUNTER — Encounter: Payer: Self-pay | Admitting: Family Medicine

## 2018-09-28 ENCOUNTER — Other Ambulatory Visit: Payer: Self-pay

## 2018-09-28 ENCOUNTER — Ambulatory Visit (INDEPENDENT_AMBULATORY_CARE_PROVIDER_SITE_OTHER): Payer: Medicare Other | Admitting: Family Medicine

## 2018-09-28 DIAGNOSIS — Z8619 Personal history of other infectious and parasitic diseases: Secondary | ICD-10-CM

## 2018-09-28 DIAGNOSIS — B0229 Other postherpetic nervous system involvement: Secondary | ICD-10-CM | POA: Diagnosis not present

## 2018-09-28 NOTE — Progress Notes (Signed)
Virtual Visit via telephone Note Due to COVID-19, visit is conducted virtually and was requested by patient. This visit type was conducted due to national recommendations for restrictions regarding the COVID-19 Pandemic (e.g. social distancing) in an effort to limit this patient's exposure and mitigate transmission in our community. All issues noted in this document were discussed and addressed.  A physical exam was not performed with this format.   I connected with Shelly [name] on 09/28/18 at 1410 by telephone and verified that I am speaking with the correct person using two identifiers. Shelly [name] is currently located at home and family is currently with them during visit. The provider, Monia Pouch, FNP is located in their office at time of visit.  I discussed the limitations, risks, security and privacy concerns of performing an evaluation and management service by telephone and the availability of in person appointments. I also discussed with the patient that there may be a patient responsible charge related to this service. The patient expressed understanding and agreed to proceed.  Subjective:  Patient ID: Shelly [name], female    DOB: [birth year], 78 y.o.   MRN: [id number]  Chief Complaint:  Herpes Zoster   HPI: Shelly [name] is a 78 y.o. female presenting on 09/28/2018 for Herpes Zoster   Pt reports ongoing pain from shingles. Pt states this started in November and is not getting any better. She has been taking Lyrica 100 mg twice daily and using lidocaine cream topically. Pt states these measures are not that beneficial. Pt states the constant pain is making her depressed. States the pain is on her right shoulder and radiates around under her right arm. States the lesions are gone, but shooting and burning pain is present and severe at times. Pt states her clothing bothers the site. No other associated symptoms.   Rash  This is a chronic problem. The current  episode started more than 1 month ago. The problem has been waxing and waning since onset. The affected locations include the back, right axilla and right shoulder. She was exposed to nothing. Associated symptoms include fatigue. Pertinent negatives include no anorexia, congestion, cough, diarrhea, eye pain, facial edema, fever, joint pain, rhinorrhea, shortness of breath, sore throat or vomiting. Past treatments include analgesics. The treatment provided no relief.     Relevant past medical, surgical, family, and social history reviewed and updated as indicated.  Allergies and medications reviewed and updated.   Past Medical History:  Diagnosis Date  . Allergy   . Anemia    GI bleed  . Anginal pain ([place])   . Anxiety   . Arthritis   . Blood transfusion without reported diagnosis   . Cataract   . Chronic kidney disease   . Coronary artery disease    SEVERE  . GERD (gastroesophageal reflux disease)   . GI bleed 12/2013  . Goiter   . Hyperlipidemia   . Hypertension   . Myocardial infarct ([place])   . Myocardial infarction ([place]) 08/2013  . Neuralgia of right lower extremity 07/13/2017  . Pancolonic diverticulosis 12/2013    Past Surgical History:  Procedure Laterality Date  . COLONOSCOPY N/A 01/13/2014   Dr. Hung:pandiverticulosis   . COLONOSCOPY N/A 12/14/2015   Dr. Michail Sermon: pancolonic diverticulosis, internal hemorrhoids   . CORNEAL TRANSPLANT    . CORONARY ANGIOPLASTY  08/2013  . ESOPHAGOGASTRODUODENOSCOPY N/A 12/14/2015   Dr. Michail Sermon: normal   . GIVENS CAPSULE STUDY N/A 08/13/2016   Dr. Oneida Alar: enteritis due to  aspirin.   Marland Kitchen HEEL SPUR EXCISION    . LEFT HEART CATHETERIZATION WITH CORONARY ANGIOGRAM N/A 08/23/2013   Procedure: LEFT HEART CATHETERIZATION WITH CORONARY ANGIOGRAM;  Surgeon: Peter M Martinique, MD;  Location: [facility] CATH LAB;  Service: Cardiovascular;  Laterality: N/A;  . PERCUTANEOUS CORONARY STENT INTERVENTION (PCI-S) N/A 08/24/2013   Procedure: PERCUTANEOUS CORONARY STENT  INTERVENTION (PCI-S);  Surgeon: Peter M Martinique, MD;  Location: [facility] CATH LAB;  Service: Cardiovascular;  Laterality: N/A;  . TONSILLECTOMY    . TUBAL LIGATION      Social History   Socioeconomic History  . Marital status: Widowed    Spouse name: Not on file  . Number of children: 8  . Years of education: 25  . Highest education level: Not on file  Occupational History  . 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 Financial resource strain: Not on file  . Food insecurity:    Worry: Not on file    Inability: Not on file  . Transportation needs:    Medical: Not on file    Non-medical: Not on file  Tobacco Use  . Smoking status: Never Smoker  . Smokeless tobacco: Never Used  Substance and Sexual Activity  . Alcohol use: No    Alcohol/week: 0.0 standard drinks  . Drug use: No  . Sexual activity: Not Currently    Comment: divorced, lives with daughter and granddaughters  Lifestyle  . Physical activity:    Days per week: Not on file    Minutes per session: Not on file  . Stress: Not on file  Relationships  . Social connections:    Talks on phone: Not on file    Gets together: Not on file    Attends religious service: Not on file    Active member of club or organization: Not on file    Attends meetings of clubs or organizations: Not on file    Relationship status: Not on file  . Intimate partner violence:    Fear of current or ex partner: Not on file    Emotionally abused: Not on file    Physically abused: Not on file    Forced sexual activity: Not on file  Other Topics Concern  . Not on file  Social History Narrative   Works in a group home   Lives with daughter   Two grand daughters    One son home most of the time    Outpatient Encounter Medications as of 09/28/2018  Medication Sig  . amLODipine (NORVASC) 5 MG tablet Take 1 tablet (5 mg total) by mouth daily.  Marland Kitchen atorvastatin (LIPITOR) 40 MG tablet Take 1 tablet (40 mg total) by mouth  daily at 6 PM.  . b complex vitamins capsule Take 1 capsule by mouth daily.  . clotrimazole-betamethasone (LOTRISONE) cream Apply 1 application topically 2 (two) times daily.  Marland Kitchen Dexlansoprazole (DEXILANT) 30 MG capsule Take 1 capsule (30 mg total) by mouth daily.  Marland Kitchen HYDROcodone-acetaminophen (NORCO/VICODIN) 5-325 MG tablet Take 1-2 tablets by mouth 2 (two) times daily as needed for moderate pain.  Marland Kitchen lidocaine (XYLOCAINE) 5 % ointment APPLY AS DIRECTED AS NEEDED.  Marland Kitchen lisinopril (PRINIVIL,ZESTRIL) 40 MG tablet Take 1 tablet (40 mg total) by mouth daily.  . metoprolol succinate (TOPROL XL) 25 MG 24 hr tablet Take 1 tablet (25 mg total) by mouth daily.  . Multiple Vitamin (MULTIVITAMIN) tablet Take 1 tablet by mouth daily.  . nitroGLYCERIN (NITROSTAT) 0.4 MG SL  tablet Place 1 tablet (0.4 mg total) under the tongue every 5 (five) minutes as needed for chest pain.  . pregabalin (LYRICA) 100 MG capsule Take 1 capsule (100 mg total) by mouth 2 (two) times daily.   No facility-administered encounter medications on file as of 09/28/2018.     Allergies  Allergen Reactions  . Nsaids Other (See Comments)    AVOID all NSAID due to history of GI bleeding    Review of Systems  Constitutional: Positive for fatigue. Negative for fever.  HENT: Negative for congestion, ear pain, hearing loss, rhinorrhea and sore throat.   Eyes: Negative for photophobia, pain and visual disturbance.  Respiratory: Negative for cough, chest tightness and shortness of breath.   Cardiovascular: Negative for chest pain, palpitations and leg swelling.  Gastrointestinal: Negative for anorexia, diarrhea and vomiting.  Musculoskeletal: Negative for joint pain.  Skin: Positive for rash.  Neurological: Negative for dizziness, tremors, syncope, weakness, light-headedness and headaches.  Psychiatric/Behavioral: Negative for confusion.  All other systems reviewed and are negative.        Observations/Objective: No vital signs or  physical exam, this was a telephone or virtual health encounter.  Pt alert and oriented, answers all questions appropriately, and able to speak in full sentences.    Assessment and Plan: Shavonte was seen today for herpes zoster.  Diagnoses and all orders for this visit:  Postherpetic neuralgia History of shingles Pt does not wish to change medications at this time. Interested in Shingrix vaccine, aware she should wait until symptoms completely resolve before getting the vaccine. Pt does not wish to add any treatments to her current regimen. Symptomatic care discussed. Continue Lyrica and Lidocaine cream. Report any new or worsening symptoms.     Follow Up Instructions: Return in about 1 week (around 10/05/2018), or if symptoms worsen or fail to improve, for shingles.    I discussed the assessment and treatment plan with the patient. The patient was provided an opportunity to ask questions and all were answered. The patient agreed with the plan and demonstrated an understanding of the instructions.   The patient was advised to call back or seek an in-person evaluation if the symptoms worsen or if the condition fails to improve as anticipated.  The above assessment and management plan was discussed with the patient. The patient verbalized understanding of and has agreed to the management plan. Patient is aware to call the clinic if symptoms persist or worsen. Patient is aware when to return to the clinic for a follow-up visit. Patient educated on when it is appropriate to go to the emergency department.    I provided 15 minutes of non-face-to-face time during this encounter. The call started at 1410. The call ended at 1425. The other time was used for coordination of care.    Monia Pouch, FNP-C [facility] [address] [place], [place] [address] [phone]

## 2018-10-07 ENCOUNTER — Other Ambulatory Visit: Payer: Self-pay

## 2018-10-08 ENCOUNTER — Ambulatory Visit (INDEPENDENT_AMBULATORY_CARE_PROVIDER_SITE_OTHER): Payer: Medicare Other | Admitting: Family Medicine

## 2018-10-08 ENCOUNTER — Encounter: Payer: Self-pay | Admitting: Family Medicine

## 2018-10-08 ENCOUNTER — Telehealth: Payer: Self-pay | Admitting: Cardiovascular Disease

## 2018-10-08 VITALS — BP 135/60 | HR 69 | Temp 98.3°F | Ht 63.0 in | Wt 239.8 lb

## 2018-10-08 DIAGNOSIS — R52 Pain, unspecified: Secondary | ICD-10-CM | POA: Diagnosis not present

## 2018-10-08 DIAGNOSIS — M1712 Unilateral primary osteoarthritis, left knee: Secondary | ICD-10-CM | POA: Diagnosis not present

## 2018-10-08 DIAGNOSIS — Z79891 Long term (current) use of opiate analgesic: Secondary | ICD-10-CM | POA: Diagnosis not present

## 2018-10-08 DIAGNOSIS — M12811 Other specific arthropathies, not elsewhere classified, right shoulder: Secondary | ICD-10-CM | POA: Diagnosis not present

## 2018-10-08 DIAGNOSIS — M75101 Unspecified rotator cuff tear or rupture of right shoulder, not specified as traumatic: Secondary | ICD-10-CM

## 2018-10-08 DIAGNOSIS — F1129 Opioid dependence with unspecified opioid-induced disorder: Secondary | ICD-10-CM

## 2018-10-08 MED ORDER — METHYLPREDNISOLONE ACETATE 80 MG/ML IJ SUSP
80.0000 mg | Freq: Once | INTRAMUSCULAR | Status: AC
  Administered 2018-10-08: 14:00:00 80 mg via INTRAMUSCULAR

## 2018-10-08 MED ORDER — HYDROCODONE-ACETAMINOPHEN 5-325 MG PO TABS: 1.0000 | ORAL_TABLET | Freq: Two times a day (BID) | ORAL | 0 refills | Status: DC | PRN

## 2018-10-08 MED ORDER — METHYLPREDNISOLONE ACETATE 80 MG/ML IJ SUSP
80.0000 mg | Freq: Once | INTRAMUSCULAR | Status: AC
  Administered 2018-10-08: 80 mg via INTRAMUSCULAR

## 2018-10-08 NOTE — Progress Notes (Signed)
BP 135/60   Pulse 69   Temp 98.3 F (36.8 C) (Oral)   Ht 5\' 3"  (1.6 m)   Wt 239 lb 12.8 oz (108.8 kg)   BMI 42.48 kg/m    Subjective:   Patient ID: Shelly [name], female    DOB: [birth year], 78 y.o.   MRN: [id number]  HPI: Shelly [name] is a 78 y.o. female presenting on 10/08/2018 for pain due to shingles (on going)   HPI Pain assessment: Cause of pain-left knee osteoarthritis and postherpetic neuralgia under right axilla and back Pain location-left knee and right axilla Pain on scale of 1-10- 5 Frequency-Daily, sometimes keeps her up at night What increases pain-movement in the knee makes it worse and her weight pressuring on it, the other just flares up on her skin randomly What makes pain Better-medications do help some Effects on ADL -limits her ability to walk and the pain on her shoulder eliminates her ability to use the arm as much as she would like and sometimes it hurts and sometimes it prevents her from sleeping Any change in general medical condition-none  Current opioids rx-hydrocodone-acetaminophen 5-3 25, 1 to 2 tablets daily as needed # meds rx-60 Effectiveness of current meds-left knee is doing okay but neuralgia is not Adverse reactions form pain meds-none Morphine equivalent- 10-20  Pill count performed-No Last drug screen -July 2019 ( high risk q34m, moderate risk q33m, low risk yearly ) Urine drug screen today- Yes Was the [place] reviewed-yes  If yes were their any concerning findings? -None  No flowsheet data found.   Pain contract signed on: 10/08/2018  Right shoulder pain and left knee pain osteoarthritis Patient is having right shoulder pain and left knee pain that has been worsening recently, she does have arthritis in both likely due to obesity but also she feels like she has some pain more in her shoulder with overhead range of motion and has been bothering her more over the past few weeks.  Arthritis in the knee is also flared up over  the past month or so.  She has had an injection before in the knee and would like to consider that again.  Patient denies any numbness or weakness in either arm or leg.  Rates the pain is moderate.  Relevant past medical, surgical, family and social history reviewed and updated as indicated. Interim medical history since our last visit reviewed. Allergies and medications reviewed and updated.  Review of Systems  Constitutional: Negative for chills and fever.  Eyes: Negative for redness and visual disturbance.  Respiratory: Negative for chest tightness and shortness of breath.   Cardiovascular: Negative for chest pain and leg swelling.  Musculoskeletal: Positive for arthralgias. Negative for back pain, gait problem and joint swelling.  Skin: Negative for rash.  Neurological: Negative for weakness, light-headedness, numbness and headaches.  Psychiatric/Behavioral: Negative for agitation and behavioral problems.  All other systems reviewed and are negative.   Per HPI unless specifically indicated above   Allergies as of 10/08/2018      Reactions   Nsaids Other (See Comments)   AVOID all NSAID due to history of GI bleeding      Medication List       Accurate as of Oct 08, 2018  2:07 PM. If you have any questions, ask your nurse or doctor.        amLODipine 5 MG tablet Commonly known as:  NORVASC Take 1 tablet (5 mg total) by mouth daily.   atorvastatin 40  MG tablet Commonly known as:  LIPITOR Take 1 tablet (40 mg total) by mouth daily at 6 PM.   b complex vitamins capsule Take 1 capsule by mouth daily.   clotrimazole-betamethasone cream Commonly known as:  Lotrisone Apply 1 application topically 2 (two) times daily.   Dexlansoprazole 30 MG capsule Commonly known as:  Dexilant Take 1 capsule (30 mg total) by mouth daily.   HYDROcodone-acetaminophen 5-325 MG tablet Commonly known as:  NORCO/VICODIN Take 1-2 tablets by mouth 2 (two) times daily as needed for moderate  pain.   lidocaine 5 % ointment Commonly known as:  XYLOCAINE APPLY AS DIRECTED AS NEEDED.   lisinopril 40 MG tablet Commonly known as:  ZESTRIL Take 1 tablet (40 mg total) by mouth daily.   metoprolol succinate 25 MG 24 hr tablet Commonly known as:  Toprol XL Take 1 tablet (25 mg total) by mouth daily.   multivitamin tablet Take 1 tablet by mouth daily.   nitroGLYCERIN 0.4 MG SL tablet Commonly known as:  NITROSTAT Place 1 tablet (0.4 mg total) under the tongue every 5 (five) minutes as needed for chest pain.   pregabalin 100 MG capsule Commonly known as:  Lyrica Take 1 capsule (100 mg total) by mouth 2 (two) times daily.        Objective:   BP 135/60   Pulse 69   Temp 98.3 F (36.8 C) (Oral)   Ht 5\' 3"  (1.6 m)   Wt 239 lb 12.8 oz (108.8 kg)   BMI 42.48 kg/m   Wt Readings from Last 3 Encounters:  10/08/18 239 lb 12.8 oz (108.8 kg)  09/17/18 233 lb 9.6 oz (106 kg)  08/30/18 238 lb 9.6 oz (108.2 kg)    Physical Exam Vitals signs and nursing note reviewed.  Constitutional:      General: She is not in acute distress.    Appearance: She is well-developed. She is not diaphoretic.  Eyes:     Conjunctiva/sclera: Conjunctivae normal.  Cardiovascular:     Rate and Rhythm: Normal rate and regular rhythm.     Heart sounds: Normal heart sounds. No murmur.  Pulmonary:     Effort: Pulmonary effort is normal. No respiratory distress.     Breath sounds: Normal breath sounds. No wheezing.  Musculoskeletal: Normal range of motion.     Right shoulder: She exhibits tenderness (Pain with overhead range of motion and positive for pain with internal rotation). She exhibits normal range of motion, no deformity and normal strength.     Left knee: She exhibits effusion. She exhibits normal range of motion, normal alignment, no LCL laxity, normal patellar mobility, normal meniscus and no MCL laxity. Tenderness found. Medial joint line and lateral joint line tenderness noted.  Skin:     General: Skin is warm and dry.     Findings: No rash.  Neurological:     Mental Status: She is alert and oriented to person, place, and time.     Coordination: Coordination normal.  Psychiatric:        Behavior: Behavior normal.     Knee injection: Consent form signed. Risk factors of bleeding and infection discussed with patient and patient is agreeable towards injection. Patient prepped with Betadine. Lateral approach towards injection used. Injected 80 mg of Depo-Medrol and 1 mL of 2% lidocaine. Patient tolerated procedure well and no side effects from noted. Minimal to no bleeding. Simple bandage applied after.   Right subacromial injection: Consent form signed. Risk factors of bleeding and infection  discussed with patient and patient is agreeable towards injection. Patient prepped with Betadine. Lateral approach towards injection used. Injected 80 mg of Depo-Medrol and 1 mL of 2% lidocaine. Patient tolerated procedure well and no side effects from noted. Minimal to no bleeding. Simple bandage applied after.   Assessment & Plan:   Problem List Items Addressed This Visit      Musculoskeletal and Integument   Osteoarthritis left knee   Relevant Medications   HYDROcodone-acetaminophen (NORCO/VICODIN) 5-325 MG tablet   methylPREDNISolone acetate (DEPO-MEDROL) injection 80 mg (Completed)   methylPREDNISolone acetate (DEPO-MEDROL) injection 80 mg (Completed)   Other Relevant Orders   ToxASSURE Select 13 (MW), Urine    Other Visit Diagnoses    Pain management    -  Primary   Relevant Medications   HYDROcodone-acetaminophen (NORCO/VICODIN) 5-325 MG tablet   Other Relevant Orders   ToxASSURE Select 13 (MW), Urine   Opioid dependence with opioid-induced disorder (HCC)       Relevant Medications   HYDROcodone-acetaminophen (NORCO/VICODIN) 5-325 MG tablet   Other Relevant Orders   ToxASSURE Select 13 (MW), Urine   Chronic prescription opiate use       Relevant Medications    HYDROcodone-acetaminophen (NORCO/VICODIN) 5-325 MG tablet   Other Relevant Orders   ToxASSURE Select 13 (MW), Urine   Right rotator cuff tear arthropathy       Relevant Medications   methylPREDNISolone acetate (DEPO-MEDROL) injection 80 mg (Completed)   methylPREDNISolone acetate (DEPO-MEDROL) injection 80 mg (Completed)       Follow up plan: Return in about 4 weeks (around 11/05/2018), or if symptoms worsen or fail to improve, for pain manag.  Counseling provided for all of the vaccine components Orders Placed This Encounter  Procedures  . ToxASSURE Select 13 (MW), Urine    Caryl Pina, MD [place] 10/08/2018, 2:07 PM

## 2018-10-08 NOTE — Telephone Encounter (Signed)
Virtual Visit Pre-Appointment Phone Call  "(Name), I am calling you today to discuss your upcoming appointment. We are currently trying to limit exposure to the virus that causes COVID-19 by seeing patients at home rather than in the office."  1. "What is the BEST phone number to call the day of the visit?" - include this in appointment notes  2. Do you have or have access to (through a family member/friend) a smartphone with video capability that we can use for your visit?" a. If yes - list this number in appt notes as cell (if different from BEST phone #) and list the appointment type as a VIDEO visit in appointment notes b. If no - list the appointment type as a PHONE visit in appointment notes  3. Confirm consent - "In the setting of the current Covid19 crisis, you are scheduled for a (phone or video) visit with your provider on (date) at (time).  Just as we do with many in-office visits, in order for you to participate in this visit, we must obtain consent.  If you'd like, I can send this to your mychart (if signed up) or email for you to review.  Otherwise, I can obtain your verbal consent now.  All virtual visits are billed to your insurance company just like a normal visit would be.  By agreeing to a virtual visit, we'd like you to understand that the technology does not allow for your provider to perform an examination, and thus may limit your provider's ability to fully assess your condition. If your provider identifies any concerns that need to be evaluated in person, we will make arrangements to do so.  Finally, though the technology is pretty good, we cannot assure that it will always work on either your or our end, and in the setting of a video visit, we may have to convert it to a phone-only visit.  In either situation, we cannot ensure that we have a secure connection.  Are you willing to proceed?" STAFF: Did the patient verbally acknowledge consent to telehealth visit? Document  YES/NO here: yes  4. Advise patient to be prepared - "Two hours prior to your appointment, go ahead and check your blood pressure, pulse, oxygen saturation, and your weight (if you have the equipment to check those) and write them all down. When your visit starts, your provider will ask you for this information. If you have an Apple Watch or Kardia device, please plan to have heart rate information ready on the day of your appointment. Please have a pen and paper handy nearby the day of the visit as well."  5. Give patient instructions for MyChart download to smartphone OR Doximity/Doxy.me as below if video visit (depending on what platform provider is using)  6. Inform patient they will receive a phone call 15 minutes prior to their appointment time (may be from unknown caller ID) so they should be prepared to answer    TELEPHONE CALL NOTE  KERISHA GOUGHNOUR has been deemed a candidate for a follow-up tele-health visit to limit community exposure during the Covid-19 pandemic. I spoke with the patient via phone to ensure availability of phone/video source, confirm preferred email & phone number, and discuss instructions and expectations.  I reminded Shelly [name] to be prepared with any vital sign and/or heart rhythm information that could potentially be obtained via home monitoring, at the time of her visit. I reminded LARRISSA STIVERS to expect a phone call prior to  her visit.  Weston Anna 10/08/2018 3:16 PM   INSTRUCTIONS FOR DOWNLOADING THE MYCHART APP TO SMARTPHONE  - The patient must first make sure to have activated MyChart and know their login information - If Apple, go to [employer] and type in MyChart in the search bar and download the app. If Android, ask patient to go to [employer] and type in [place] in the search bar and download the app. The app is free but as with any other app downloads, their phone may require them to verify saved payment information or  Apple/Android password.  - The patient will need to then log into the app with their MyChart username and password, and select  as their healthcare provider to link the account. When it is time for your visit, go to the MyChart app, find appointments, and click Begin Video Visit. Be sure to Select Allow for your device to access the Microphone and Camera for your visit. You will then be connected, and your provider will be with you shortly.  **If they have any issues connecting, or need assistance please contact MyChart service desk [phone])**  **If using a computer, in order to ensure the best quality for their visit they will need to use either of the following Internet Browsers: [employer], or Google Chrome**  IF USING DOXIMITY or DOXY.ME - The patient will receive a link just prior to their visit by text.     FULL LENGTH CONSENT FOR TELE-HEALTH VISIT   I hereby voluntarily request, consent and authorize [facility] and its employed or contracted physicians, physician assistants, nurse practitioners or other licensed health care professionals (the Practitioner), to provide me with telemedicine health care services (the Services") as deemed necessary by the treating Practitioner. I acknowledge and consent to receive the Services by the Practitioner via telemedicine. I understand that the telemedicine visit will involve communicating with the Practitioner through live audiovisual communication technology and the disclosure of certain medical information by electronic transmission. I acknowledge that I have been given the opportunity to request an in-person assessment or other available alternative prior to the telemedicine visit and am voluntarily participating in the telemedicine visit.  I understand that I have the right to withhold or withdraw my consent to the use of telemedicine in the course of my care at any time, without affecting my right to future care  or treatment, and that the Practitioner or I may terminate the telemedicine visit at any time. I understand that I have the right to inspect all information obtained and/or recorded in the course of the telemedicine visit and may receive copies of available information for a reasonable fee.  I understand that some of the potential risks of receiving the Services via telemedicine include:   Delay or interruption in medical evaluation due to technological equipment failure or disruption;  Information transmitted may not be sufficient (e.g. poor resolution of images) to allow for appropriate medical decision making by the Practitioner; and/or   In rare instances, security protocols could fail, causing a breach of personal health information.  Furthermore, I acknowledge that it is my responsibility to provide information about my medical history, conditions and care that is complete and accurate to the best of my ability. I acknowledge that Practitioner's advice, recommendations, and/or decision may be based on factors not within their control, such as incomplete or inaccurate data provided by me or distortions of diagnostic images or specimens that may result from electronic transmissions. I  understand that the practice of medicine is not an exact science and that Practitioner makes no warranties or guarantees regarding treatment outcomes. I acknowledge that I will receive a copy of this consent concurrently upon execution via email to the email address I last provided but may also request a printed copy by calling the office of [facility].    I understand that my insurance will be billed for this visit.   I have read or had this consent read to me.  I understand the contents of this consent, which adequately explains the benefits and risks of the Services being provided via telemedicine.   I have been provided ample opportunity to ask questions regarding this consent and the Services and have had  my questions answered to my satisfaction.  I give my informed consent for the services to be provided through the use of telemedicine in my medical care  By participating in this telemedicine visit I agree to the above.

## 2018-10-13 ENCOUNTER — Other Ambulatory Visit: Payer: Self-pay

## 2018-10-13 ENCOUNTER — Encounter: Payer: Self-pay | Admitting: Cardiovascular Disease

## 2018-10-13 ENCOUNTER — Telehealth (INDEPENDENT_AMBULATORY_CARE_PROVIDER_SITE_OTHER): Payer: Medicare Other | Admitting: Cardiovascular Disease

## 2018-10-13 VITALS — BP 130/58 | HR 69 | Ht 63.0 in | Wt 239.0 lb

## 2018-10-13 DIAGNOSIS — I10 Essential (primary) hypertension: Secondary | ICD-10-CM

## 2018-10-13 DIAGNOSIS — I25118 Atherosclerotic heart disease of native coronary artery with other forms of angina pectoris: Secondary | ICD-10-CM

## 2018-10-13 DIAGNOSIS — Z955 Presence of coronary angioplasty implant and graft: Secondary | ICD-10-CM

## 2018-10-13 DIAGNOSIS — E785 Hyperlipidemia, unspecified: Secondary | ICD-10-CM

## 2018-10-13 DIAGNOSIS — I252 Old myocardial infarction: Secondary | ICD-10-CM

## 2018-10-13 MED ORDER — ATORVASTATIN CALCIUM 80 MG PO TABS: 80.0000 mg | ORAL_TABLET | Freq: Every day | ORAL | 3 refills | Status: DC

## 2018-10-13 NOTE — Patient Instructions (Addendum)
Medication Instructions:   Increase Lipitor to 80mg  daily.  New prescription sent to pharmacy today.   Continue all other medications.    Labwork: none  Testing/Procedures: none  Follow-Up: Your physician wants you to follow up in: 6 months.  You will receive a reminder letter in the mail one-two months in advance.  If you don't receive a letter, please call our office to schedule the follow up appointment   Any Other Special Instructions Will Be Listed Below (If Applicable).  If you need a refill on your cardiac medications before your next appointment, please call your pharmacy.

## 2018-10-13 NOTE — Progress Notes (Signed)
Virtual Visit via Telephone Note   This visit type was conducted due to national recommendations for restrictions regarding the COVID-19 Pandemic (e.g. social distancing) in an effort to limit this patient's exposure and mitigate transmission in our community.  Due to her co-morbid illnesses, this patient is at least at moderate risk for complications without adequate follow up.  This format is felt to be most appropriate for this patient at this time.  The patient did not have access to video technology/had technical difficulties with video requiring transitioning to audio format only (telephone).  All issues noted in this document were discussed and addressed.  No physical exam could be performed with this format.  Please refer to the patient's chart for her  consent to telehealth for [facility].   Date:  10/13/2018   ID:  Shelly [name], DOB [birth year], MRN [id number]  Patient Location: Home Provider Location: Office  PCP:  [name], [name], MD  Cardiologist:  [name], MD  Electrophysiologist:  None   Evaluation Performed:  Follow-Up Visit  Chief Complaint:  CAD  History of Present Illness:    Shelly [name] is a 78 y.o. female with a history of coronary artery disease.  She sustained a non-STEMI and underwent overlapping stent placement to the proximal to mid RCA on 08/24/2013.  She had residual moderate coronary artery disease.  She still has some residual effects from shingles with neuropathy on the right shoulder and right arm.  She denies chest pain. She has chronic shortness of breath which she attributes due to deconditioning as she is limited by chronic left knee pain. She does not want to undergo left knee replacement surgery both due to financial reasons as well as her fear of the surgery and postoperative recovery.  The patient does not have symptoms concerning for COVID-19 infection (fever, chills, cough, or new shortness of breath).    Past Medical  History:  Diagnosis Date  . Allergy   . Anemia    GI bleed  . Anginal pain ([place])   . Anxiety   . Arthritis   . Blood transfusion without reported diagnosis   . Cataract   . Chronic kidney disease   . Coronary artery disease    SEVERE  . GERD (gastroesophageal reflux disease)   . GI bleed 12/2013  . Goiter   . Hyperlipidemia   . Hypertension   . Myocardial infarct ([place])   . Myocardial infarction ([place]) 08/2013  . Neuralgia of right lower extremity 07/13/2017  . Pancolonic diverticulosis 12/2013   Past Surgical History:  Procedure Laterality Date  . COLONOSCOPY N/A 01/13/2014   Dr. Hung:pandiverticulosis   . COLONOSCOPY N/A 12/14/2015   Dr. Michail Sermon: pancolonic diverticulosis, internal hemorrhoids   . CORNEAL TRANSPLANT    . CORONARY ANGIOPLASTY  08/2013  . ESOPHAGOGASTRODUODENOSCOPY N/A 12/14/2015   Dr. Michail Sermon: normal   . GIVENS CAPSULE STUDY N/A 08/13/2016   Dr. Oneida Alar: enteritis due to aspirin.   Marland Kitchen HEEL SPUR EXCISION    . LEFT HEART CATHETERIZATION WITH CORONARY ANGIOGRAM N/A 08/23/2013   Procedure: LEFT HEART CATHETERIZATION WITH CORONARY ANGIOGRAM;  Surgeon: Peter M Martinique, MD;  Location: [facility] CATH LAB;  Service: Cardiovascular;  Laterality: N/A;  . PERCUTANEOUS CORONARY STENT INTERVENTION (PCI-S) N/A 08/24/2013   Procedure: PERCUTANEOUS CORONARY STENT INTERVENTION (PCI-S);  Surgeon: Peter M Martinique, MD;  Location: [facility] CATH LAB;  Service: Cardiovascular;  Laterality: N/A;  . TONSILLECTOMY    . TUBAL LIGATION       Current  Meds  Medication Sig  . amLODipine (NORVASC) 5 MG tablet Take 1 tablet (5 mg total) by mouth daily.  Marland Kitchen atorvastatin (LIPITOR) 40 MG tablet Take 1 tablet (40 mg total) by mouth daily at 6 PM.  . b complex vitamins capsule Take 1 capsule by mouth daily.  . clotrimazole-betamethasone (LOTRISONE) cream Apply 1 application topically 2 (two) times daily.  Marland Kitchen Dexlansoprazole (DEXILANT) 30 MG capsule Take 1 capsule (30 mg total) by mouth daily.  Marland Kitchen  HYDROcodone-acetaminophen (NORCO/VICODIN) 5-325 MG tablet Take 1-2 tablets by mouth 2 (two) times daily as needed for moderate pain.  Marland Kitchen lidocaine (XYLOCAINE) 5 % ointment APPLY AS DIRECTED AS NEEDED.  Marland Kitchen lisinopril (PRINIVIL,ZESTRIL) 40 MG tablet Take 1 tablet (40 mg total) by mouth daily.  . metoprolol succinate (TOPROL XL) 25 MG 24 hr tablet Take 1 tablet (25 mg total) by mouth daily.  . Multiple Vitamin (MULTIVITAMIN) tablet Take 1 tablet by mouth daily.  . nitroGLYCERIN (NITROSTAT) 0.4 MG SL tablet Place 1 tablet (0.4 mg total) under the tongue every 5 (five) minutes as needed for chest pain.  . pregabalin (LYRICA) 100 MG capsule Take 1 capsule (100 mg total) by mouth 2 (two) times daily.     Allergies:   Nsaids   Social History   Tobacco Use  . Smoking status: Never Smoker  . Smokeless tobacco: Never Used  Substance Use Topics  . Alcohol use: No    Alcohol/week: 0.0 standard drinks  . Drug use: No     Family Hx: The patient's family history includes Anemia in her daughter; Arthritis in her daughter and daughter; Asthma in her sister; COPD in her sister; Cancer in her daughter, daughter, and mother; Diverticulitis in her daughter; Early death in her brother and brother; GER disease in her son; GI problems in her son; Hernia in her son; Hypertension in her brother; Liver cancer in her daughter; Other in her father; Pancreatic disease in her daughter; Stroke in her sister and sister; Transient ischemic attack in her brother.  ROS:   Please see the history of present illness.     All other systems reviewed and are negative.   Prior CV studies:   The following studies were reviewed today:  NA  Labs/Other Tests and Data Reviewed:    EKG:  No ECG reviewed.  Recent Labs: 08/30/2018: ALT 14; BUN 22; Creatinine, Ser 1.30; Hemoglobin 11.4; Platelets 281; Potassium 4.6; Sodium 143   Recent Lipid Panel Lab Results  Component Value Date/Time   CHOL 164 08/30/2018 09:08 AM   TRIG  79 08/30/2018 09:08 AM   HDL 48 08/30/2018 09:08 AM   CHOLHDL 3.4 08/30/2018 09:08 AM   CHOLHDL 2.6 11/20/2016 08:54 AM   LDLCALC 100 (H) 08/30/2018 09:08 AM    Wt Readings from Last 3 Encounters:  10/13/18 239 lb (108.4 kg)  10/08/18 239 lb 12.8 oz (108.8 kg)  09/17/18 233 lb 9.6 oz (106 kg)     Objective:    Vital Signs:  BP (!) 130/58   Pulse 69   Ht 5\' 3"  (1.6 m)   Wt 239 lb (108.4 kg)   BMI 42.34 kg/m    VITAL SIGNS:  reviewed  ASSESSMENT & PLAN:    1. CAD s/p NSTEMI with overlapping stents in the proximal to mid RCA on 08/24/13: Stable ischemic heart disease. She has residual moderate CAD but denies recurrent anginal symptoms. Continue ASA, lisinopril, Lipitor, and Toprol-XL.  2. Hypertension:Blood pressure is controlled.  No changes to therapy.  She did not tolerate 10 mg of amlodipine as it led to lower extremity edema.  3. Hyperlipidemia:LDL 100 on 08/30/18. I will increase Lipitor to 80 mg.  4.  Chronic kidney disease stage III: Creatinine 1.3 on 08/30/2018.    COVID-19 Education: The signs and symptoms of COVID-19 were discussed with the patient and how to seek care for testing (follow up with PCP or arrange E-visit).  The importance of social distancing was discussed today.  Time:   Today, I have spent 25 minutes with the patient with telehealth technology discussing the above problems.     Medication Adjustments/Labs and Tests Ordered: Current medicines are reviewed at length with the patient today.  Concerns regarding medicines are outlined above.   Tests Ordered: No orders of the defined types were placed in this encounter.   Medication Changes: No orders of the defined types were placed in this encounter.   Disposition:  Follow up in 6 month(s)  Signed, [name], MD  10/13/2018 2:01 PM    [facility]

## 2018-10-13 NOTE — Addendum Note (Signed)
Addended by: Laurine Blazer on: 10/13/2018 02:13 PM   Modules accepted: Orders

## 2018-10-14 LAB — TOXASSURE SELECT 13 (MW), URINE

## 2018-10-26 NOTE — Telephone Encounter (Signed)
[place] last week 10/21/2018 inquiring about pts application. I was informed that on the application, pt put 5 people live in the household and they would need the income of all paries in the household. Pt submitted her income with the files faxed and children are in the home as well. Pt has been off of Dexilant 60 mg for a while and wants to know if there is another PPI she can try that is more affordable? Pt is also aware that she can get samples of Dexilant if wanted.

## 2018-10-28 NOTE — Telephone Encounter (Signed)
There are other PPIs that would be more affordable. Would recommend samples of Dexilant for now until this can be worked out, as this has worked best in the past. Let me know if she wants a different medication called in. I would use Protonix or omeprazole.

## 2018-10-28 NOTE — Telephone Encounter (Signed)
Yes, pt would like an inexpensive medication sent to her pharamcy.

## 2018-10-29 MED ORDER — PANTOPRAZOLE SODIUM 40 MG PO TBEC: 40.0000 mg | DELAYED_RELEASE_TABLET | Freq: Every day | ORAL | 3 refills | Status: DC

## 2018-10-29 NOTE — Telephone Encounter (Signed)
Protonix sent to pharmacy

## 2018-10-29 NOTE — Telephone Encounter (Signed)
Pt notified of results and samples are ready for pickup.

## 2018-10-29 NOTE — Addendum Note (Signed)
Addended by: Annitta Needs on: 10/29/2018 09:18 AM   Modules accepted: Orders

## 2018-11-09 ENCOUNTER — Telehealth: Payer: Self-pay | Admitting: Cardiovascular Disease

## 2018-11-09 NOTE — Telephone Encounter (Signed)
Reports bilateral lower extremity swelling with left being worse than right. Denies pain in legs or feet but reports a feeling of pins in legs and feet when walking. Patient concerned that change in atorvastatin has caused her to have swelling and sensation in legs and feet. Denies adding sodium to diet or drinking extra fluids. Unable to weigh. Denies chest pain, sob or dizziness. Seeing PCP on Thursday for soreness on left side of back. Reports having similar issues in the past with higher dose of atorvastatin. Advised to decrease back to 40 mg atorvastatin to see if symptoms improve. Advised to discuss swelling in legs and feet with her PCP as well,and have them contact our office if they feel she needs to be seen by her cardiologist. Verbalized understanding of plan.

## 2018-11-09 NOTE — Telephone Encounter (Signed)
Since medication change her feet and up her legs have been swelling

## 2018-11-10 ENCOUNTER — Other Ambulatory Visit: Payer: Self-pay

## 2018-11-11 ENCOUNTER — Ambulatory Visit (INDEPENDENT_AMBULATORY_CARE_PROVIDER_SITE_OTHER): Payer: Medicare Other | Admitting: Family Medicine

## 2018-11-11 ENCOUNTER — Encounter: Payer: Self-pay | Admitting: Family Medicine

## 2018-11-11 VITALS — BP 125/64 | HR 81 | Temp 99.1°F | Ht 63.0 in | Wt 244.0 lb

## 2018-11-11 DIAGNOSIS — S29012A Strain of muscle and tendon of back wall of thorax, initial encounter: Secondary | ICD-10-CM

## 2018-11-11 DIAGNOSIS — R609 Edema, unspecified: Secondary | ICD-10-CM

## 2018-11-11 MED ORDER — DICLOFENAC SODIUM 75 MG PO TBEC: 75.0000 mg | DELAYED_RELEASE_TABLET | Freq: Two times a day (BID) | ORAL | 0 refills | Status: DC

## 2018-11-11 NOTE — Progress Notes (Signed)
BP 125/64   Pulse 81   Temp 99.1 F (37.3 C) (Oral)   Ht 5\' 3"  (1.6 m)   Wt 244 lb (110.7 kg)   BMI 43.22 kg/m    Subjective:   Patient ID: Shelly [name], female    DOB: [birth year], 78 y.o.   MRN: [id number]  HPI: Shelly [name] is a 78 y.o. female presenting on 11/11/2018 for Foot Swelling (bilateral x 1 week) and Back Pain (middle back - patient states there is a place that is sore )   HPI Bilateral foot swelling Patient has bilateral foot swelling that started a week ago when the increase her dose of atorvastatin from 40 up to 80 mg.  Now she is back taking the 40 and she feels like the swelling is starting to go down and the aches in her legs is starting to go down from the swelling.  Upper and middle back pain Patient has been having back pain in her upper middle back on the left side.  She still has upper back pain from where the shingles were on her right side but that is more of a nerve superficial pain.  On the upper left back it feels more like a muscular tightness or strain.  She does not feel like it radiates anywhere and denies any numbness or weakness in the left upper back pain.  Relevant past medical, surgical, family and social history reviewed and updated as indicated. Interim medical history since our last visit reviewed. Allergies and medications reviewed and updated.  Review of Systems  Constitutional: Negative for chills and fever.  Eyes: Negative for visual disturbance.  Respiratory: Negative for chest tightness and shortness of breath.   Cardiovascular: Positive for leg swelling. Negative for chest pain.  Musculoskeletal: Positive for back pain. Negative for gait problem.  Skin: Negative for rash.  Neurological: Negative for light-headedness and headaches.  Psychiatric/Behavioral: Negative for agitation and behavioral problems.  All other systems reviewed and are negative.   Per HPI unless specifically indicated above   Allergies as of  11/11/2018      Reactions   Nsaids Other (See Comments)   AVOID all NSAID due to history of GI bleeding      Medication List       Accurate as of November 11, 2018 11:32 AM. If you have any questions, ask your nurse or doctor.        amLODipine 5 MG tablet Commonly known as: NORVASC Take 1 tablet (5 mg total) by mouth daily.   atorvastatin 80 MG tablet Commonly known as: LIPITOR Take 1 tablet (80 mg total) by mouth daily.   b complex vitamins capsule Take 1 capsule by mouth daily.   clotrimazole-betamethasone cream Commonly known as: Lotrisone Apply 1 application topically 2 (two) times daily.   Dexlansoprazole 30 MG capsule Commonly known as: Dexilant Take 1 capsule (30 mg total) by mouth daily.   diclofenac 75 MG EC tablet Commonly known as: VOLTAREN Take 1 tablet (75 mg total) by mouth 2 (two) times daily. Started by: [name], MD   HYDROcodone-acetaminophen 5-325 MG tablet Commonly known as: NORCO/VICODIN Take 1-2 tablets by mouth 2 (two) times daily as needed for moderate pain.   lidocaine 5 % ointment Commonly known as: XYLOCAINE APPLY AS DIRECTED AS NEEDED.   lisinopril 40 MG tablet Commonly known as: ZESTRIL Take 1 tablet (40 mg total) by mouth daily.   metoprolol succinate 25 MG 24 hr tablet Commonly known as: Toprol  XL Take 1 tablet (25 mg total) by mouth daily.   multivitamin tablet Take 1 tablet by mouth daily.   nitroGLYCERIN 0.4 MG SL tablet Commonly known as: NITROSTAT Place 1 tablet (0.4 mg total) under the tongue every 5 (five) minutes as needed for chest pain.   pantoprazole 40 MG tablet Commonly known as: PROTONIX Take 1 tablet (40 mg total) by mouth daily. 30 minutes before breakfast   pregabalin 100 MG capsule Commonly known as: Lyrica Take 1 capsule (100 mg total) by mouth 2 (two) times daily.        Objective:   BP 125/64   Pulse 81   Temp 99.1 F (37.3 C) (Oral)   Ht 5\' 3"  (1.6 m)   Wt 244 lb (110.7 kg)   BMI  43.22 kg/m   Wt Readings from Last 3 Encounters:  11/11/18 244 lb (110.7 kg)  10/13/18 239 lb (108.4 kg)  10/08/18 239 lb 12.8 oz (108.8 kg)    Physical Exam Vitals signs and nursing note reviewed.  Constitutional:      General: She is not in acute distress.    Appearance: She is well-developed. She is not diaphoretic.  Eyes:     Conjunctiva/sclera: Conjunctivae normal.  Cardiovascular:     Rate and Rhythm: Normal rate and regular rhythm.     Heart sounds: Normal heart sounds. No murmur.  Pulmonary:     Effort: Pulmonary effort is normal. No respiratory distress.     Breath sounds: Normal breath sounds. No wheezing.  Musculoskeletal: Normal range of motion.        General: Swelling (Bilateral lower extremity trace edema, slightly tender from mid calf down because of swelling.) present.     Thoracic back: She exhibits tenderness. She exhibits normal range of motion.       Back:  Skin:    General: Skin is warm and dry.     Findings: No rash.  Neurological:     Mental Status: She is alert and oriented to person, place, and time.     Coordination: Coordination normal.  Psychiatric:        Behavior: Behavior normal.       Assessment & Plan:   Problem List Items Addressed This Visit    None    Visit Diagnoses    Upper back strain, initial encounter    -  Primary   Relevant Medications   diclofenac (VOLTAREN) 75 MG EC tablet   Peripheral edema          Patient says she has peripheral edema from the Lipitor when I tried to increase it to 80 mg and now that she is back taking the 40 she feels like it is improving.  Patient continues to have pain on her right upper back from the neuropathy from shingles.  Will try Voltaren gel for that and the muscular pain on her left upper back. Follow up plan: Return if symptoms worsen or fail to improve.  Counseling provided for all of the vaccine components No orders of the defined types were placed in this encounter.   Caryl Pina, MD [facility] 11/11/2018, 11:32 AM

## 2018-11-15 ENCOUNTER — Other Ambulatory Visit: Payer: Self-pay | Admitting: *Deleted

## 2018-11-15 MED ORDER — METOPROLOL SUCCINATE ER 25 MG PO TB24: 25.0000 mg | ORAL_TABLET | Freq: Every day | ORAL | 2 refills | Status: DC

## 2018-11-15 NOTE — Telephone Encounter (Signed)
VM from patient to RF Metoprolol last OV for HTN 08/30/18, Last RF 11/23/17 by Bonnell Public PA-C Please advise

## 2018-11-16 ENCOUNTER — Telehealth: Payer: Self-pay | Admitting: Family Medicine

## 2018-11-16 NOTE — Telephone Encounter (Signed)
Incoming call from pt Pt is having continued bilateral edema Pt has has wt gain Please review and advise

## 2018-11-17 NOTE — Telephone Encounter (Signed)
Patient aware.

## 2018-11-17 NOTE — Telephone Encounter (Signed)
Metoprolol does not cause this but her amlodipine that she is on could cause this, if she wants to stop her amlodipine because her blood pressures were on the lower side when we saw her last and then keep an eye on her blood pressure and get it checked regularly or if she does not have a machine and come into the office in 1 to 2 weeks and have her blood pressure checked, this can be with the nurse.  Again the metoprolol is not likely to cause this but the amlodipine might so she wants to discontinue the amlodipine for now that would be fine.

## 2018-11-17 NOTE — Telephone Encounter (Signed)
Left message for patient to call.

## 2018-11-30 ENCOUNTER — Ambulatory Visit: Payer: Medicare Other | Admitting: Gastroenterology

## 2018-11-30 ENCOUNTER — Ambulatory Visit (INDEPENDENT_AMBULATORY_CARE_PROVIDER_SITE_OTHER): Payer: Medicare Other | Admitting: Cardiovascular Disease

## 2018-11-30 ENCOUNTER — Encounter: Payer: Self-pay | Admitting: Gastroenterology

## 2018-11-30 ENCOUNTER — Other Ambulatory Visit: Payer: Self-pay

## 2018-11-30 ENCOUNTER — Encounter: Payer: Self-pay | Admitting: Cardiovascular Disease

## 2018-11-30 VITALS — BP 148/68 | HR 71 | Temp 98.8°F | Ht 66.0 in | Wt 248.0 lb

## 2018-11-30 VITALS — BP 144/77 | HR 77 | Temp 98.2°F | Ht 66.0 in | Wt 249.0 lb

## 2018-11-30 DIAGNOSIS — K219 Gastro-esophageal reflux disease without esophagitis: Secondary | ICD-10-CM

## 2018-11-30 DIAGNOSIS — E785 Hyperlipidemia, unspecified: Secondary | ICD-10-CM | POA: Diagnosis not present

## 2018-11-30 DIAGNOSIS — I252 Old myocardial infarction: Secondary | ICD-10-CM | POA: Diagnosis not present

## 2018-11-30 DIAGNOSIS — I25118 Atherosclerotic heart disease of native coronary artery with other forms of angina pectoris: Secondary | ICD-10-CM

## 2018-11-30 DIAGNOSIS — N183 Chronic kidney disease, stage 3 unspecified: Secondary | ICD-10-CM

## 2018-11-30 DIAGNOSIS — I10 Essential (primary) hypertension: Secondary | ICD-10-CM

## 2018-11-30 DIAGNOSIS — Z955 Presence of coronary angioplasty implant and graft: Secondary | ICD-10-CM

## 2018-11-30 DIAGNOSIS — R600 Localized edema: Secondary | ICD-10-CM

## 2018-11-30 MED ORDER — POTASSIUM CHLORIDE CRYS ER 20 MEQ PO TBCR: 20.0000 meq | EXTENDED_RELEASE_TABLET | Freq: Every day | ORAL | 3 refills | Status: DC

## 2018-11-30 MED ORDER — ROSUVASTATIN CALCIUM 5 MG PO TABS: 5.0000 mg | ORAL_TABLET | Freq: Every day | ORAL | 3 refills | Status: DC

## 2018-11-30 MED ORDER — CHLORTHALIDONE 25 MG PO TABS: 25.0000 mg | ORAL_TABLET | Freq: Every day | ORAL | 3 refills | Status: DC

## 2018-11-30 NOTE — Progress Notes (Signed)
SUBJECTIVE: The patient presents for premature follow-up.  I last evaluated her on 10/13/2018 at which time she was doing well.  She has a history of coronary artery disease. She sustained a non-STEMI and underwent overlapping stent placement to the proximal to mid RCA on 08/24/2013.  She had residual moderate coronary artery disease.  She was unable to tolerate the higher dose of atorvastatin 80 mg and it was recommended by her PCP that she stop it altogether.  She was having some swelling in her legs and some joint swelling and aching.  The joint pains have resolved.  She is also on amlodipine 5 mg and her PCP suggested she could consider stopping this as well.  She has been having residual neuropathic pain from shingles and is taking Lyrica.  It helps her sleep but gives her eye pain.  She plans to speak with her PCP regarding this.  She had been experiencing some back and flank pain and her PCP prescribed diclofenac and this has since resolved.  She has had difficulty sleeping with increased stress due to the passing of 2 of her daughters, most recently in November 2019.  She was in her early 70s and passed of cancer.  Social history: 2 of her daughters passed away within the past 2 years of cancer, both in their early 94s.  The patient has a strong faith.  Review of Systems: As per "subjective", otherwise negative.  Allergies  Allergen Reactions  . Nsaids Other (See Comments)    AVOID all NSAID due to history of GI bleeding    Current Outpatient Medications  Medication Sig Dispense Refill  . amLODipine (NORVASC) 5 MG tablet Take 1 tablet (5 mg total) by mouth daily. 90 tablet 3  . b complex vitamins capsule Take 1 capsule by mouth daily.    . clopidogrel (PLAVIX) 75 MG tablet Take 75 mg by mouth daily.    . clotrimazole-betamethasone (LOTRISONE) cream Apply 1 application topically 2 (two) times daily. 45 g 1  . HYDROcodone-acetaminophen (NORCO/VICODIN) 5-325 MG tablet Take 1-2  tablets by mouth 2 (two) times daily as needed for moderate pain. 60 tablet 0  . lidocaine (XYLOCAINE) 5 % ointment APPLY AS DIRECTED AS NEEDED. 35.44 g 1  . lisinopril (PRINIVIL,ZESTRIL) 40 MG tablet Take 1 tablet (40 mg total) by mouth daily. 90 tablet 3  . metoprolol succinate (TOPROL XL) 25 MG 24 hr tablet Take 1 tablet (25 mg total) by mouth daily. 30 tablet 2  . Multiple Vitamin (MULTIVITAMIN) tablet Take 1 tablet by mouth daily.    . pantoprazole (PROTONIX) 40 MG tablet Take 1 tablet (40 mg total) by mouth daily. 30 minutes before breakfast 90 tablet 3  . pregabalin (LYRICA) 100 MG capsule Take 1 capsule (100 mg total) by mouth 2 (two) times daily. 60 capsule 1   No current facility-administered medications for this visit.     Past Medical History:  Diagnosis Date  . Allergy   . Anemia    GI bleed  . Anginal pain ([place])   . Anxiety   . Arthritis   . Blood transfusion without reported diagnosis   . Cataract   . Chronic kidney disease   . Coronary artery disease    SEVERE  . GERD (gastroesophageal reflux disease)   . GI bleed 12/2013  . Goiter   . Hyperlipidemia   . Hypertension   . Myocardial infarct ([place])   . Myocardial infarction ([place]) 08/2013  . Neuralgia of  right lower extremity 07/13/2017  . Pancolonic diverticulosis 12/2013    Past Surgical History:  Procedure Laterality Date  . COLONOSCOPY N/A 01/13/2014   Dr. Hung:pandiverticulosis   . COLONOSCOPY N/A 12/14/2015   Dr. Michail Sermon: pancolonic diverticulosis, internal hemorrhoids   . CORNEAL TRANSPLANT    . CORONARY ANGIOPLASTY  08/2013  . ESOPHAGOGASTRODUODENOSCOPY N/A 12/14/2015   Dr. Michail Sermon: normal   . GIVENS CAPSULE STUDY N/A 08/13/2016   Dr. Oneida Alar: enteritis due to aspirin.   Marland Kitchen HEEL SPUR EXCISION    . LEFT HEART CATHETERIZATION WITH CORONARY ANGIOGRAM N/A 08/23/2013   Procedure: LEFT HEART CATHETERIZATION WITH CORONARY ANGIOGRAM;  Surgeon: Peter M Martinique, MD;  [facility] LAB;  Service: Cardiovascular;   Laterality: N/A;  . PERCUTANEOUS CORONARY STENT INTERVENTION (PCI-S) N/A 08/24/2013   Procedure: PERCUTANEOUS CORONARY STENT INTERVENTION (PCI-S);  Surgeon: Peter M Martinique, MD;  Location: [facility] CATH LAB;  Service: Cardiovascular;  Laterality: N/A;  . TONSILLECTOMY    . TUBAL LIGATION      Social History   Socioeconomic History  . Marital status: Widowed    Spouse name: Not on file  . Number of children: 8  . Years of education: 13  . Highest education level: Not on file  Occupational History  . 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 Financial resource strain: Not on file  . Food insecurity    Worry: Not on file    Inability: Not on file  . Transportation needs    Medical: Not on file    Non-medical: Not on file  Tobacco Use  . Smoking status: Never Smoker  . Smokeless tobacco: Never Used  Substance and Sexual Activity  . Alcohol use: No    Alcohol/week: 0.0 standard drinks  . Drug use: No  . Sexual activity: Not Currently    Comment: divorced, lives with daughter and granddaughters  Lifestyle  . Physical activity    Days per week: Not on file    Minutes per session: Not on file  . Stress: Not on file  Relationships  . Social Herbalist on phone: Not on file    Gets together: Not on file    Attends religious service: Not on file    Active member of club or organization: Not on file    Attends meetings of clubs or organizations: Not on file    Relationship status: Not on file  . Intimate partner violence    Fear of current or ex partner: Not on file    Emotionally abused: Not on file    Physically abused: Not on file    Forced sexual activity: Not on file  Other Topics Concern  . Not on file  Social History Narrative   Works in a group home   Lives with daughter   Two grand daughters    One son home most of the time     Vitals:   11/30/18 1435  BP: (!) 148/68  Pulse: 71  Temp: 98.8 F (37.1 C)  SpO2: 98%  Weight: 248  lb (112.5 kg)  Height: 5\' 6"  (1.676 m)    Wt Readings from Last 3 Encounters:  11/30/18 248 lb (112.5 kg)  11/30/18 249 lb (112.9 kg)  11/11/18 244 lb (110.7 kg)     PHYSICAL EXAM General: NAD HEENT: Normal. Neck: No JVD, no thyromegaly. Lungs: Clear to auscultation bilaterally with normal respiratory effort. CV: Regular rate and rhythm, normal S1/S2, no  S3/S4, no murmur.  Bilateral lower extremity nonpitting edema.   Abdomen: Soft, nontender, no distention.  Neurologic: Alert and oriented.  Psych: Normal affect. Skin: Normal. Musculoskeletal: No gross deformities.    ECG: Reviewed above under Subjective   Labs: Lab Results  Component Value Date/Time   K 4.6 08/30/2018 09:08 AM   BUN 22 08/30/2018 09:08 AM   CREATININE 1.30 (H) 08/30/2018 09:08 AM   CREATININE 1.23 (H) 11/20/2016 08:54 AM   ALT 14 08/30/2018 09:08 AM   TSH 0.324 (L) 12/09/2015 08:33 AM   TSH 0.292 (L) 10/19/2013 10:24 AM   HGB 11.4 08/30/2018 09:08 AM     Lipids: Lab Results  Component Value Date/Time   LDLCALC 100 (H) 08/30/2018 09:08 AM   CHOL 164 08/30/2018 09:08 AM   TRIG 79 08/30/2018 09:08 AM   HDL 48 08/30/2018 09:08 AM       ASSESSMENT AND PLAN: 1. CAD s/p NSTEMI with overlapping stents in the proximal to mid RCA on 08/24/13: Stable ischemic heart disease. She has residual moderate CAD but denies recurrent anginal symptoms. Continue ASA, lisinopril, and Toprol-XL. Unable to tolerate atorvastatin 80 mg.  I will try rosuvastatin 5 mg daily  2.Hypertension:Blood pressure is elevated today but controlled at prior visit. She did not tolerate 10 mg of amlodipine as it led to lower extremity edema.  She continues to experience leg edema.  I will stop amlodipine and start chlorthalidone 25 mg daily along with potassium 20 mEq daily.  I will check a basic metabolic panel within the next several days.  3. Hyperlipidemia:LDL 100 on 08/30/18.  Atorvastatin increased to 80 mg on 10/13/2018. Unable  to tolerate atorvastatin 80 mg.  I will try rosuvastatin 5 mg daily.  I will repeat lipids within the next several months.  4.  Chronic kidney disease stage III: Creatinine 1.3 on 08/30/2018.  As I am switching her to chlorthalidone, I will check a basic metabolic panel within the next several days.  5.  Bilateral leg edema: See discussion in #2.    Disposition: Follow up 6 months   [name], M.D., F.A.C.C.

## 2018-11-30 NOTE — Progress Notes (Signed)
Referring Provider: [name], [name], MD Primary Care Physician:  [name], [name], MD Primary GI: Dr. Oneida Alar   Chief Complaint  Patient presents with  . Follow-up    doing well    HPI:   Shelly [name] is a 78 y.o. female presenting today with a history of diverticular bleed several years ago, IDA with capsule study showing enteritis due to aspirin use, chronic GERD. Prefers frequent visits with health care team. Last visit via telemedicine in April 2020.   Has been taking Protonix just as needed. Will have flares of reflux at night. Around 1 or 2am will have mild nausea. Protonix helps relieve. Eating around 8 or 9 pm in evening at times. Having trouble with legs/feet swelling. Cardiology appt this afternoon. No abdominal pain. Good appetite. No dysphagia. No changes in bowel habits. No rectal bleeding. Wants to return in 3 months. Concerned about post-herpetic neuralgia.   Past Medical History:  Diagnosis Date  . Allergy   . Anemia    GI bleed  . Anginal pain ([place])   . Anxiety   . Arthritis   . Blood transfusion without reported diagnosis   . Cataract   . Chronic kidney disease   . Coronary artery disease    SEVERE  . GERD (gastroesophageal reflux disease)   . GI bleed 12/2013  . Goiter   . Hyperlipidemia   . Hypertension   . Myocardial infarct ([place])   . Myocardial infarction ([place]) 08/2013  . Neuralgia of right lower extremity 07/13/2017  . Pancolonic diverticulosis 12/2013    Past Surgical History:  Procedure Laterality Date  . COLONOSCOPY N/A 01/13/2014   Dr. Hung:pandiverticulosis   . COLONOSCOPY N/A 12/14/2015   Dr. Michail Sermon: pancolonic diverticulosis, internal hemorrhoids   . CORNEAL TRANSPLANT    . CORONARY ANGIOPLASTY  08/2013  . ESOPHAGOGASTRODUODENOSCOPY N/A 12/14/2015   Dr. Michail Sermon: normal   . GIVENS CAPSULE STUDY N/A 08/13/2016   Dr. Oneida Alar: enteritis due to aspirin.   Marland Kitchen HEEL SPUR EXCISION    . LEFT HEART CATHETERIZATION WITH CORONARY  ANGIOGRAM N/A 08/23/2013   Procedure: LEFT HEART CATHETERIZATION WITH CORONARY ANGIOGRAM;  Surgeon: Peter M Martinique, MD;  Location: [facility] CATH LAB;  Service: Cardiovascular;  Laterality: N/A;  . PERCUTANEOUS CORONARY STENT INTERVENTION (PCI-S) N/A 08/24/2013   Procedure: PERCUTANEOUS CORONARY STENT INTERVENTION (PCI-S);  Surgeon: Peter M Martinique, MD;  Location[facility] CATH LAB;  Service: Cardiovascular;  Laterality: N/A;  . TONSILLECTOMY    . TUBAL LIGATION      Current Outpatient Medications  Medication Sig Dispense Refill  . amLODipine (NORVASC) 5 MG tablet Take 1 tablet (5 mg total) by mouth daily. 90 tablet 3  . b complex vitamins capsule Take 1 capsule by mouth daily.    . clopidogrel (PLAVIX) 75 MG tablet Take 75 mg by mouth daily.    . clotrimazole-betamethasone (LOTRISONE) cream Apply 1 application topically 2 (two) times daily. 45 g 1  . HYDROcodone-acetaminophen (NORCO/VICODIN) 5-325 MG tablet Take 1-2 tablets by mouth 2 (two) times daily as needed for moderate pain. 60 tablet 0  . lidocaine (XYLOCAINE) 5 % ointment APPLY AS DIRECTED AS NEEDED. 35.44 g 1  . lisinopril (PRINIVIL,ZESTRIL) 40 MG tablet Take 1 tablet (40 mg total) by mouth daily. 90 tablet 3  . metoprolol succinate (TOPROL XL) 25 MG 24 hr tablet Take 1 tablet (25 mg total) by mouth daily. 30 tablet 2  . Multiple Vitamin (MULTIVITAMIN) tablet Take 1 tablet by mouth daily.    Marland Kitchen  pantoprazole (PROTONIX) 40 MG tablet Take 1 tablet (40 mg total) by mouth daily. 30 minutes before breakfast 90 tablet 3  . pregabalin (LYRICA) 100 MG capsule Take 1 capsule (100 mg total) by mouth 2 (two) times daily. 60 capsule 1  . atorvastatin (LIPITOR) 80 MG tablet Take 1 tablet (80 mg total) by mouth daily. (Patient not taking: Reported on 11/30/2018) 90 tablet 3  . Dexlansoprazole (DEXILANT) 30 MG capsule Take 1 capsule (30 mg total) by mouth daily. (Patient not taking: Reported on 11/30/2018) 30 capsule 2  . diclofenac (VOLTAREN) 75 MG EC tablet Take 1  tablet (75 mg total) by mouth 2 (two) times daily. (Patient not taking: Reported on 11/30/2018) 30 tablet 0  . nitroGLYCERIN (NITROSTAT) 0.4 MG SL tablet Place 1 tablet (0.4 mg total) under the tongue every 5 (five) minutes as needed for chest pain. (Patient not taking: Reported on 11/30/2018) 25 tablet 3   No current facility-administered medications for this visit.     Allergies as of 11/30/2018 - Review Complete 11/30/2018  Allergen Reaction Noted  . Nsaids Other (See Comments) 07/25/2016    Family History  Problem Relation Age of Onset  . Hypertension Brother   . Transient ischemic attack Brother   . Cancer Mother        unsure of type   . Other Father        grangrene   . Asthma Sister   . Stroke Sister   . COPD Sister   . Stroke Sister   . Early death Brother   . Early death Brother   . Liver cancer Daughter   . Cancer Daughter   . Hernia Son        Umbilical  . GI problems Son   . Pancreatic disease Daughter        pancreatectomy  . Cancer Daughter   . Diverticulitis Daughter   . Arthritis Daughter        knee   . Arthritis Daughter        shoulder   . Anemia Daughter   . GER disease Son     Social History   Socioeconomic History  . Marital status: Widowed    Spouse name: Not on file  . Number of children: 8  . Years of education: 85  . Highest education level: Not on file  Occupational History  . 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 Financial resource strain: Not on file  . Food insecurity    Worry: Not on file    Inability: Not on file  . Transportation needs    Medical: Not on file    Non-medical: Not on file  Tobacco Use  . Smoking status: Never Smoker  . Smokeless tobacco: Never Used  Substance and Sexual Activity  . Alcohol use: No    Alcohol/week: 0.0 standard drinks  . Drug use: No  . Sexual activity: Not Currently    Comment: divorced, lives with daughter and granddaughters  Lifestyle  . Physical  activity    Days per week: Not on file    Minutes per session: Not on file  . Stress: Not on file  Relationships  . Social Herbalist on phone: Not on file    Gets together: Not on file    Attends religious service: Not on file    Active member of club or organization: Not on file    Attends meetings of clubs  or organizations: Not on file    Relationship status: Not on file  Other Topics Concern  . Not on file  Social History Narrative   Works in a group home   Lives with daughter   Two grand daughters    One son home most of the time    Review of Systems: Gen: Denies fever, chills, anorexia. Denies fatigue, weakness, weight loss.  CV: Denies chest pain, palpitations, syncope, peripheral edema, and claudication. Resp: Denies dyspnea at rest, cough, wheezing, coughing up blood, and pleurisy. GI:see HPI Derm: Denies rash, itching, dry skin Psych: Denies depression, anxiety, memory loss, confusion. No homicidal or suicidal ideation.  Heme: Denies bruising, bleeding, and enlarged lymph nodes.  Physical Exam: BP (!) 144/77   Pulse 77   Temp 98.2 F (36.8 C) (Oral)   Ht 5\' 6"  (1.676 m)   Wt 249 lb (112.9 kg)   BMI 40.19 kg/m  General:   Alert and oriented. No distress noted. Pleasant and cooperative.  Head:  Normocephalic and atraumatic. Eyes:  Conjuctiva clear without scleral icterus. Mouth:  Oral mucosa pink and moist.  Abdomen:  +BS, soft, non-tender and non-distended. No rebound or guarding. No HSM or masses noted. Msk:  Symmetrical without gross deformities. Normal posture. Neurologic:  Alert and  oriented x4 Psych:  Alert and cooperative. Normal mood and affect.

## 2018-11-30 NOTE — Patient Instructions (Signed)
We will see you back in 3 months!  Let's try Protonix 30 minutes before dinner each evening. Make sure on an empty stomach.   Let me know if you need anything!  I enjoyed seeing you again today! As you know, I value our relationship and want to provide genuine, compassionate, and quality care. I welcome your feedback. If you receive a survey regarding your visit,  I greatly appreciate you taking time to fill this out. See you next time!  Annitta Needs, PhD, ANP-BC [facility]

## 2018-11-30 NOTE — Patient Instructions (Addendum)
Medication Instructions:   Your physician has recommended you make the following change in your medication:   Stop atorvastatin  Start crestor 5 mg by mouth daily  Stop amlodipine  Start chlorthalidone 25 mg by mouth daily  Start potassium chloride 20 meq by mouth daily  Continue all other medications the same  Labwork:  Your physician recommends that you return for lab work next week at [facility] or [facility] to check your BMET. Your lab order has been given to you today. You do not have to fast for this lab work.  Testing/Procedures:  NONE  Follow-Up:  Your physician recommends that you schedule a follow-up appointment in: 6 months. You will receive a reminder letter in the mail in about 4 months reminding you to call and schedule your appointment. If you don't receive this letter, please contact our office.  Any Other Special Instructions Will Be Listed Below (If Applicable).  If you need a refill on your cardiac medications before your next appointment, please call your pharmacy.

## 2018-11-30 NOTE — Assessment & Plan Note (Signed)
Not ideally controlled due to behavior/dietary factors. Mainly nocturnal symptoms. Discussed avoidance of late evening intake. Will try Protonix on empty stomach 30 minutes before dinner daily. Return in 3 months per patient's request. Overall doing well from our standpoint.

## 2018-12-01 NOTE — Progress Notes (Signed)
CC'D TO PCP °

## 2018-12-03 ENCOUNTER — Telehealth: Payer: Self-pay | Admitting: Cardiovascular Disease

## 2018-12-03 NOTE — Telephone Encounter (Signed)
Pt says she noticed blood when she wiped last night after bowl movement denies any bleeding in urine or stool - was concerned since she has had GI problems in the past. Pt denies any other symptoms or noticing this prior to last night - says she had several loose stools yesterday and was very sleepy. Pt sees Shelly [name] and will contact her if this problem persist - pt wanted to make sure the recent medication changes that Dr Bronson Ing would not cause this problem

## 2018-12-03 NOTE — Telephone Encounter (Signed)
None of the med changes would be involved. Likely some hemorroidal bleeding if it was only on the toilet paper which often self resolves. I agree with progressing issue to contact her GI doctor   Zandra Abts MD

## 2018-12-03 NOTE — Telephone Encounter (Signed)
Patient called stating that [name] changed her medication on 11/30/2018. States that she went to bathroom yesterday and had blood in her stool. Wants to know if the new medication caused this.   [phone])

## 2018-12-09 DIAGNOSIS — I10 Essential (primary) hypertension: Secondary | ICD-10-CM | POA: Diagnosis not present

## 2018-12-10 ENCOUNTER — Telehealth: Payer: Self-pay | Admitting: *Deleted

## 2018-12-10 DIAGNOSIS — N183 Chronic kidney disease, stage 3 unspecified: Secondary | ICD-10-CM

## 2018-12-10 DIAGNOSIS — I10 Essential (primary) hypertension: Secondary | ICD-10-CM

## 2018-12-10 LAB — BASIC METABOLIC PANEL
BUN/Creatinine Ratio: 21 (calc) (ref 6–22)
BUN: 25 mg/dL (ref 7–25)
CO2: 21 mmol/L (ref 20–32)
Calcium: 9.8 mg/dL (ref 8.6–10.4)
Chloride: 109 mmol/L (ref 98–110)
Creat: 1.19 mg/dL — ABNORMAL HIGH (ref 0.60–0.93)
Glucose, Bld: 103 mg/dL (ref 65–139)
Potassium: 5.6 mmol/L — ABNORMAL HIGH (ref 3.5–5.3)
Sodium: 139 mmol/L (ref 135–146)

## 2018-12-10 NOTE — Telephone Encounter (Signed)
-----   Message from Arnoldo Lenis, MD sent at 12/10/2018 10:42 AM EDT ----- Potassium is elevated, have her stop taking her potassium and repeat a BMET at the end of next week.   Zandra Abts MD

## 2018-12-10 NOTE — Telephone Encounter (Signed)
BP 135/65 & HR 73-home readings by patient today Advised that she should monitor her BP & HR especially when she is feeling fatigue and sleepy to make sure her numbers aren't too low. Advised that she needed to get adequate rest at night to prevent daytime fatigue and sleepiness as well as contact her PCP regarding the sleep issues and shingle treatment. Advised if symptoms get worse, to go to the ED for an evaluation. Verbalized understanding of plan.

## 2018-12-10 NOTE — Telephone Encounter (Signed)
Patient informed and has not taken potassium today. Verbalized understanding. Will have lab work repeated next Friday at [facility]. Lab order faxed. Copy sent to PCP.

## 2018-12-10 NOTE — Telephone Encounter (Signed)
Reports that on this Monday, she started feeling fatigue and sleepy and says its worse today. Reports not sleeping well at night d/t shingles. Reports soreness in left arm rated 3-4/10. Has not taken anything for the left arm soreness but says she exercises on her stationary bike twice weekly and thinks it may have caused the soreness in her left arm. Reports having some sob all the time that is unchanged. Denies chest pain or dizziness.  Has not checked BP or HR. Advised to take BP and HR and call office back with those numbers.

## 2018-12-13 NOTE — Telephone Encounter (Signed)
Vitals are fine, I agree with pcp evaluation, its not evident anything cardiac is the issue, if they find different then can have her seen in clinic   J BranchMD

## 2018-12-21 ENCOUNTER — Telehealth: Payer: Self-pay | Admitting: Cardiovascular Disease

## 2018-12-21 DIAGNOSIS — I10 Essential (primary) hypertension: Secondary | ICD-10-CM | POA: Diagnosis not present

## 2018-12-21 DIAGNOSIS — N183 Chronic kidney disease, stage 3 (moderate): Secondary | ICD-10-CM | POA: Diagnosis not present

## 2018-12-21 NOTE — Telephone Encounter (Signed)
Asking when she is suppose to have blood work done

## 2018-12-21 NOTE — Telephone Encounter (Signed)
-----   Message from Arnoldo Lenis, MD sent at 12/10/2018 10:42 AM EDT ----- Potassium is elevated, have her stop taking her potassium and repeat a BMET at the end of next week.   Zandra Abts MD  Patient informed of the above information again and informed that she could have the lab work done today. Verbalized understanding.

## 2018-12-22 LAB — BASIC METABOLIC PANEL
BUN/Creatinine Ratio: 19 (calc) (ref 6–22)
BUN: 25 mg/dL (ref 7–25)
CO2: 24 mmol/L (ref 20–32)
Calcium: 9.9 mg/dL (ref 8.6–10.4)
Chloride: 110 mmol/L (ref 98–110)
Creat: 1.31 mg/dL — ABNORMAL HIGH (ref 0.60–0.93)
Glucose, Bld: 92 mg/dL (ref 65–139)
Potassium: 4.9 mmol/L (ref 3.5–5.3)
Sodium: 141 mmol/L (ref 135–146)

## 2018-12-24 ENCOUNTER — Telehealth: Payer: Self-pay | Admitting: *Deleted

## 2018-12-24 NOTE — Telephone Encounter (Signed)
Notes recorded by Laurine Blazer, LPN on [id number] at [id number] PM EDT  Patient notified and verbalized understanding. Copy to pmd.  ------   Notes recorded by Herminio Commons, MD on 12/24/2018 at 12:03 PM EDT  ok

## 2018-12-27 ENCOUNTER — Other Ambulatory Visit: Payer: Self-pay | Admitting: Family Medicine

## 2018-12-27 ENCOUNTER — Other Ambulatory Visit: Payer: Self-pay | Admitting: Family

## 2018-12-27 DIAGNOSIS — B0229 Other postherpetic nervous system involvement: Secondary | ICD-10-CM

## 2018-12-27 DIAGNOSIS — Z8619 Personal history of other infectious and parasitic diseases: Secondary | ICD-10-CM

## 2018-12-30 ENCOUNTER — Other Ambulatory Visit: Payer: Self-pay

## 2018-12-31 ENCOUNTER — Encounter: Payer: Self-pay | Admitting: Family Medicine

## 2018-12-31 ENCOUNTER — Ambulatory Visit (INDEPENDENT_AMBULATORY_CARE_PROVIDER_SITE_OTHER): Payer: Medicare Other | Admitting: Family Medicine

## 2018-12-31 DIAGNOSIS — M1712 Unilateral primary osteoarthritis, left knee: Secondary | ICD-10-CM

## 2018-12-31 DIAGNOSIS — R52 Pain, unspecified: Secondary | ICD-10-CM

## 2018-12-31 DIAGNOSIS — F1129 Opioid dependence with unspecified opioid-induced disorder: Secondary | ICD-10-CM

## 2018-12-31 DIAGNOSIS — Z79891 Long term (current) use of opiate analgesic: Secondary | ICD-10-CM

## 2018-12-31 MED ORDER — HYDROCODONE-ACETAMINOPHEN 5-325 MG PO TABS: 1.0000 | ORAL_TABLET | Freq: Two times a day (BID) | ORAL | 0 refills | Status: DC | PRN

## 2018-12-31 NOTE — Progress Notes (Signed)
BP (!) 147/70   Pulse 76   Temp 97.8 F (36.6 C) (Temporal)   Ht 5\' 6"  (1.676 m)   Wt 249 lb (112.9 kg)   BMI 40.19 kg/m    Subjective:   Patient ID: Shelly [name], female    DOB: [birth year], 78 y.o.   MRN: [id number]  HPI: Shelly [name] is a 78 y.o. female presenting on 12/31/2018 for Hypertension (4 month followup)   HPI Patient is coming in today for recheck of pain management refill medication.  Pain assessment: Cause of pain-bilateral osteoarthritis knees Pain location-knees Pain on scale of 1-10- 5 Frequency-2-3 times per week What increases pain-trying to get up and going in the day, prolonged rest stiffness What makes pain Better-heating pad and pain medication Effects on ADL -she does walk slower and cannot walk as far because of arthritis limiting her Any change in general medical condition-none  Current opioids rx-:-Acetaminophen 5-3 25 twice daily PRN # meds rx-60 Effectiveness of current meds-works well, she does not use it every day and sometimes is heating pad but only use it every 2 to 3 days Adverse reactions form pain meds-none Morphine equivalent-20  Pill count performed-No Last drug screen -5 22 2020 ( high risk q71m, moderate risk q31m, low risk yearly ) Urine drug screen today- No Was the [place] reviewed-yes  If yes were their any concerning findings? -None  No flowsheet data found.   Pain contract signed on: 10/08/2018  Relevant past medical, surgical, family and social history reviewed and updated as indicated. Interim medical history since our last visit reviewed. Allergies and medications reviewed and updated.  Review of Systems  Constitutional: Negative for chills and fever.  Eyes: Negative for visual disturbance.  Respiratory: Negative for chest tightness and shortness of breath.   Cardiovascular: Negative for chest pain and leg swelling.  Musculoskeletal: Positive for arthralgias and joint swelling. Negative for back pain and  gait problem.  Skin: Negative for rash.  Neurological: Negative for dizziness, light-headedness and headaches.  Psychiatric/Behavioral: Negative for agitation and behavioral problems.  All other systems reviewed and are negative.   Per HPI unless specifically indicated above   Allergies as of 12/31/2018      Reactions   Nsaids Other (See Comments)   AVOID all NSAID due to history of GI bleeding   Atorvastatin Other (See Comments)   Joint & muscle pain      Medication List       Accurate as of December 31, 2018 11:25 AM. If you have any questions, ask your nurse or doctor.        b complex vitamins capsule Take 1 capsule by mouth daily.   chlorthalidone 25 MG tablet Commonly known as: HYGROTON Take 1 tablet (25 mg total) by mouth daily.   clopidogrel 75 MG tablet Commonly known as: PLAVIX Take 75 mg by mouth daily.   clotrimazole-betamethasone cream Commonly known as: Lotrisone Apply 1 application topically 2 (two) times daily.   HYDROcodone-acetaminophen 5-325 MG tablet Commonly known as: NORCO/VICODIN Take 1-2 tablets by mouth 2 (two) times daily as needed for moderate pain.   lidocaine 5 % ointment Commonly known as: XYLOCAINE APPLY AS DIRECTED AS NEEDED.   lisinopril 40 MG tablet Commonly known as: ZESTRIL Take 1 tablet (40 mg total) by mouth daily.   metoprolol succinate 25 MG 24 hr tablet Commonly known as: Toprol XL Take 1 tablet (25 mg total) by mouth daily.   multivitamin tablet Take 1 tablet by mouth  daily.   pantoprazole 40 MG tablet Commonly known as: PROTONIX Take 1 tablet (40 mg total) by mouth daily. 30 minutes before breakfast   pregabalin 100 MG capsule Commonly known as: LYRICA TAKE ONE CAPSULE BY MOUTH TWICE DAILY   rosuvastatin 5 MG tablet Commonly known as: CRESTOR Take 1 tablet (5 mg total) by mouth daily.        Objective:   BP (!) 147/70   Pulse 76   Temp 97.8 F (36.6 C) (Temporal)   Ht 5\' 6"  (1.676 m)   Wt 249 lb  (112.9 kg)   BMI 40.19 kg/m   Wt Readings from Last 3 Encounters:  12/31/18 249 lb (112.9 kg)  11/30/18 248 lb (112.5 kg)  11/30/18 249 lb (112.9 kg)    Physical Exam Vitals signs and nursing note reviewed.  Constitutional:      General: She is not in acute distress.    Appearance: She is well-developed. She is not diaphoretic.  Eyes:     Conjunctiva/sclera: Conjunctivae normal.  Neurological:     Mental Status: She is alert and oriented to person, place, and time.     Coordination: Coordination normal.  Psychiatric:        Behavior: Behavior normal.       Assessment & Plan:   Problem List Items Addressed This Visit      Musculoskeletal and Integument   Osteoarthritis left knee   Relevant Medications   HYDROcodone-acetaminophen (NORCO/VICODIN) 5-325 MG tablet    Other Visit Diagnoses    Pain management       Relevant Medications   HYDROcodone-acetaminophen (NORCO/VICODIN) 5-325 MG tablet   Opioid dependence with opioid-induced disorder (HCC)       Relevant Medications   HYDROcodone-acetaminophen (NORCO/VICODIN) 5-325 MG tablet   Chronic prescription opiate use       Relevant Medications   HYDROcodone-acetaminophen (NORCO/VICODIN) 5-325 MG tablet      Chronic bilateral knee pain from obesity and arthritis, continue hydrocodone as needed, she only uses it infrequently. Follow up plan: Return in about 3 months (around 04/02/2019), or if symptoms worsen or fail to improve, for Hypertension follow-up.  Counseling provided for all of the vaccine components No orders of the defined types were placed in this encounter.   Shelly Pina, MD [facility] 12/31/2018, 11:25 AM

## 2019-01-20 ENCOUNTER — Other Ambulatory Visit: Payer: Self-pay | Admitting: Family Medicine

## 2019-01-20 DIAGNOSIS — F1129 Opioid dependence with unspecified opioid-induced disorder: Secondary | ICD-10-CM

## 2019-01-20 DIAGNOSIS — M1712 Unilateral primary osteoarthritis, left knee: Secondary | ICD-10-CM

## 2019-01-20 DIAGNOSIS — Z79891 Long term (current) use of opiate analgesic: Secondary | ICD-10-CM

## 2019-01-20 DIAGNOSIS — R52 Pain, unspecified: Secondary | ICD-10-CM

## 2019-01-21 NOTE — Telephone Encounter (Signed)
Clotrimazole can be refilled but hydrocodone needs to be filled in a visit.

## 2019-01-25 ENCOUNTER — Telehealth: Payer: Self-pay | Admitting: *Deleted

## 2019-01-25 NOTE — Telephone Encounter (Signed)
Prior Auth for Hydrococdone-Acetaminophen 5-325-In Process  Key: [id number] -    PA Case ID: [id number]   [facility] is reviewing your PA request. Typically an electronic response will be received within 72 hours. To check for an update later, open this request from your dashboard.  You may close this dialog and return to your dashboard to perform other tasks.

## 2019-01-26 NOTE — Telephone Encounter (Signed)
This medication or product is on your plan's list of covered drugs. Prior authorization is not required at this time. If your pharmacy has questions regarding the processing of your prescription, please have them call the [facility] pharmacy help desk at [phone]. **Please note: Formulary lowering, tiering exception, cost reduction and prospective Medicare hospice reviews cannot be requested using this method of submission. Please contact us at [phone] instead.

## 2019-02-03 ENCOUNTER — Telehealth: Payer: Self-pay | Admitting: Family Medicine

## 2019-02-03 NOTE — Telephone Encounter (Signed)
Patient states that cardiologist changed her medication.  Medication list is up to date.

## 2019-02-10 ENCOUNTER — Encounter: Payer: Self-pay | Admitting: Family Medicine

## 2019-02-10 ENCOUNTER — Ambulatory Visit (INDEPENDENT_AMBULATORY_CARE_PROVIDER_SITE_OTHER): Payer: Medicare Other | Admitting: Family Medicine

## 2019-02-10 DIAGNOSIS — N183 Chronic kidney disease, stage 3 unspecified: Secondary | ICD-10-CM

## 2019-02-10 DIAGNOSIS — B0229 Other postherpetic nervous system involvement: Secondary | ICD-10-CM | POA: Diagnosis not present

## 2019-02-10 DIAGNOSIS — K219 Gastro-esophageal reflux disease without esophagitis: Secondary | ICD-10-CM

## 2019-02-10 DIAGNOSIS — I10 Essential (primary) hypertension: Secondary | ICD-10-CM | POA: Diagnosis not present

## 2019-02-10 MED ORDER — METOPROLOL SUCCINATE ER 25 MG PO TB24: 25.0000 mg | ORAL_TABLET | Freq: Every day | ORAL | 3 refills | Status: DC

## 2019-02-10 MED ORDER — LISINOPRIL 40 MG PO TABS: 40.0000 mg | ORAL_TABLET | Freq: Every day | ORAL | 3 refills | Status: DC

## 2019-02-10 NOTE — Progress Notes (Signed)
Virtual Visit via telephone Note  I connected with Shelly [name] on 02/10/19 at [place] by telephone and verified that I am speaking with the correct person using two identifiers. Shelly [name] is currently located at home and no other people are currently with her during visit. The provider, [name] [name], MD is located in their office at time of visit.  Call ended at 0945  I discussed the limitations, risks, security and privacy concerns of performing an evaluation and management service by telephone and the availability of in person appointments. I also discussed with the patient that there may be a patient responsible charge related to this service. The patient expressed understanding and agreed to proceed.   History and Present Illness: Hypertension Patient is currently on chlorthalidone and metoprolol and lisinopril, and their blood pressure today is unknown, patient is at home. Patient denies any lightheadedness or dizziness. Patient denies headaches, blurred vision, chest pains, shortness of breath, or weakness. Denies any side effects from medication and is content with current medication. She says she has no energy and feels tired all of the time  GERD Patient is currently on pantoprazole.  She denies any major symptoms or abdominal pain or belching or burping. She denies any blood in her stool or lightheadedness or dizziness.   Patient is still complaining of nerve pain from shingles and it is not improving.   No diagnosis found.  Outpatient Encounter Medications as of 02/10/2019  Medication Sig  . b complex vitamins capsule Take 1 capsule by mouth daily.  . chlorthalidone (HYGROTON) 25 MG tablet Take 1 tablet (25 mg total) by mouth daily.  . clopidogrel (PLAVIX) 75 MG tablet Take 75 mg by mouth daily.  . clotrimazole-betamethasone (LOTRISONE) cream APPLY AS DIRECTED TWICE DAILY.  Marland Kitchen HYDROcodone-acetaminophen (NORCO/VICODIN) 5-325 MG tablet Take 1-2 tablets by mouth 2  (two) times daily as needed for moderate pain.  Marland Kitchen lidocaine (XYLOCAINE) 5 % ointment APPLY AS DIRECTED AS NEEDED.  Marland Kitchen lisinopril (PRINIVIL,ZESTRIL) 40 MG tablet Take 1 tablet (40 mg total) by mouth daily.  . metoprolol succinate (TOPROL XL) 25 MG 24 hr tablet Take 1 tablet (25 mg total) by mouth daily.  . Multiple Vitamin (MULTIVITAMIN) tablet Take 1 tablet by mouth daily.  . pantoprazole (PROTONIX) 40 MG tablet Take 1 tablet (40 mg total) by mouth daily. 30 minutes before breakfast  . pregabalin (LYRICA) 100 MG capsule TAKE ONE CAPSULE BY MOUTH TWICE DAILY  . rosuvastatin (CRESTOR) 5 MG tablet Take 1 tablet (5 mg total) by mouth daily.   No facility-administered encounter medications on file as of 02/10/2019.     Review of Systems  Constitutional: Negative for chills and fever.  Eyes: Negative for visual disturbance.  Respiratory: Negative for chest tightness and shortness of breath.   Cardiovascular: Negative for chest pain and leg swelling.  Musculoskeletal: Negative for back pain and gait problem.  Skin: Negative for rash.  Neurological: Positive for numbness. Negative for dizziness, weakness, light-headedness and headaches.  Psychiatric/Behavioral: Negative for agitation, behavioral problems, sleep disturbance and suicidal ideas. The patient is not nervous/anxious.   All other systems reviewed and are negative.   Observations/Objective: Patient sounds comfortable and in no acute distress  Assessment and Plan: Problem List Items Addressed This Visit      Cardiovascular and Mediastinum   Essential hypertension   Relevant Medications   lisinopril (ZESTRIL) 40 MG tablet   metoprolol succinate (TOPROL XL) 25 MG 24 hr tablet     Digestive  GERD (gastroesophageal reflux disease) - Primary     Nervous and Auditory   Postherpetic neuralgia     Genitourinary   CKD (chronic kidney disease), stage III ([place])       Follow Up Instructions: Follow up in 3 months for htn   Cut  metoprolol in half and monitor bps closely   I discussed the assessment and treatment plan with the patient. The patient was provided an opportunity to ask questions and all were answered. The patient agreed with the plan and demonstrated an understanding of the instructions.   The patient was advised to call back or seek an in-person evaluation if the symptoms worsen or if the condition fails to improve as anticipated.  The above assessment and management plan was discussed with the patient. The patient verbalized understanding of and has agreed to the management plan. Patient is aware to call the clinic if symptoms persist or worsen. Patient is aware when to return to the clinic for a follow-up visit. Patient educated on when it is appropriate to go to the emergency department.    I provided 13 minutes of non-face-to-face time during this encounter.    [name], MD

## 2019-03-02 ENCOUNTER — Ambulatory Visit: Payer: Medicare Other | Admitting: Gastroenterology

## 2019-03-02 ENCOUNTER — Encounter: Payer: Self-pay | Admitting: Gastroenterology

## 2019-03-02 ENCOUNTER — Other Ambulatory Visit: Payer: Self-pay

## 2019-03-02 VITALS — BP 161/71 | HR 73 | Temp 97.0°F | Ht 64.0 in | Wt 250.2 lb

## 2019-03-02 DIAGNOSIS — K219 Gastro-esophageal reflux disease without esophagitis: Secondary | ICD-10-CM | POA: Diagnosis not present

## 2019-03-02 NOTE — Progress Notes (Signed)
Primary Care Physician:  [name], [name], MD Primary GI: Dr. Oneida Alar   Chief Complaint  Patient presents with  . Gastroesophageal Reflux    does fine as long as she takes her medication    HPI:   Shelly [name] is a 78 y.o. female presenting today with a history of diverticular bleed several years ago, IDA with capsule study showing enteritis due to aspirin use, chronic GERD. IDA resolved. Prefers frequent visits with health care team. Last visit July 2020.   Feels very fatigued. Lower extremity edema returned. DOE. No shortness of breath at rest. +joint pain. No abdominal pain. Protonix daily prn. No dysphagia. GERD overall controlled with prn use. Pyschosocial stressors. No constipation or diarrhea. No overt GI bleeding.   Past Medical History:  Diagnosis Date  . Allergy   . Anemia    GI bleed  . Anginal pain ([place])   . Anxiety   . Arthritis   . Blood transfusion without reported diagnosis   . Cataract   . Chronic kidney disease   . Coronary artery disease    SEVERE  . GERD (gastroesophageal reflux disease)   . GI bleed 12/2013  . Goiter   . Hyperlipidemia   . Hypertension   . Myocardial infarct ([place])   . Myocardial infarction ([place]) 08/2013  . Neuralgia of right lower extremity 07/13/2017  . Pancolonic diverticulosis 12/2013    Past Surgical History:  Procedure Laterality Date  . COLONOSCOPY N/A 01/13/2014   Dr. Hung:pandiverticulosis   . COLONOSCOPY N/A 12/14/2015   Dr. Michail Sermon: pancolonic diverticulosis, internal hemorrhoids   . CORNEAL TRANSPLANT    . CORONARY ANGIOPLASTY  08/2013  . ESOPHAGOGASTRODUODENOSCOPY N/A 12/14/2015   Dr. Michail Sermon: normal   . GIVENS CAPSULE STUDY N/A 08/13/2016   Dr. Oneida Alar: enteritis due to aspirin.   Marland Kitchen HEEL SPUR EXCISION    . LEFT HEART CATHETERIZATION WITH CORONARY ANGIOGRAM N/A 08/23/2013   Procedure: LEFT HEART CATHETERIZATION WITH CORONARY ANGIOGRAM;  Surgeon: Peter M Martinique, MD;  Location[facility] C[facility] LAB;  Service:  Cardiovascular;  Laterality: N/A;  . PERCUTANEOUS CORONARY STENT INTERVENTION (PCI-S) N/A 08/24/2013   Procedure: PERCUTANEOUS CORONARY STENT INTERVENTION (PCI-S);  Surgeon: Peter M Martinique, MD;  Location: [facility] CATH LAB;  Service: Cardiovascular;  Laterality: N/A;  . TONSILLECTOMY    . TUBAL LIGATION      Current Outpatient Medications  Medication Sig Dispense Refill  . b complex vitamins capsule Take 1 capsule by mouth daily.    . cetirizine (ZYRTEC) 10 MG tablet Take 10 mg by mouth daily.    . chlorthalidone (HYGROTON) 25 MG tablet Take 1 tablet (25 mg total) by mouth daily. 90 tablet 3  . clopidogrel (PLAVIX) 75 MG tablet Take 75 mg by mouth daily.    . clotrimazole-betamethasone (LOTRISONE) cream APPLY AS DIRECTED TWICE DAILY. 45 g 1  . HYDROcodone-acetaminophen (NORCO/VICODIN) 5-325 MG tablet Take 1-2 tablets by mouth 2 (two) times daily as needed for moderate pain. 60 tablet 0  . lidocaine (XYLOCAINE) 5 % ointment APPLY AS DIRECTED AS NEEDED. 35.44 g 1  . lisinopril (ZESTRIL) 40 MG tablet Take 1 tablet (40 mg total) by mouth daily. 90 tablet 3  . metoprolol succinate (TOPROL XL) 25 MG 24 hr tablet Take 1 tablet (25 mg total) by mouth daily. 90 tablet 3  . Multiple Vitamin (MULTIVITAMIN) tablet Take 1 tablet by mouth daily.    . pantoprazole (PROTONIX) 40 MG tablet Take 1 tablet (40 mg total) by mouth daily.  30 minutes before breakfast 90 tablet 3  . pregabalin (LYRICA) 100 MG capsule TAKE ONE CAPSULE BY MOUTH TWICE DAILY 60 capsule 1  . rosuvastatin (CRESTOR) 5 MG tablet Take 1 tablet (5 mg total) by mouth daily. 90 tablet 3   No current facility-administered medications for this visit.     Allergies as of 03/02/2019 - Review Complete 03/02/2019  Allergen Reaction Noted  . Nsaids Other (See Comments) 07/25/2016  . Atorvastatin Other (See Comments) 11/30/2018    Family History  Problem Relation Age of Onset  . Hypertension Brother   . Transient ischemic attack Brother   . Cancer  Mother        unsure of type   . Other Father        grangrene   . Asthma Sister   . Stroke Sister   . COPD Sister   . Stroke Sister   . Early death Brother   . Early death Brother   . Liver cancer Daughter   . Cancer Daughter   . Hernia Son        Umbilical  . GI problems Son   . Pancreatic disease Daughter        pancreatectomy  . Cancer Daughter   . Diverticulitis Daughter   . Arthritis Daughter        knee   . Arthritis Daughter        shoulder   . Anemia Daughter   . GER disease Son     Social History   Socioeconomic History  . Marital status: Widowed    Spouse name: Not on file  . Number of children: 8  . Years of education: 85  . Highest education level: Not on file  Occupational History  . 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 Financial resource strain: Not on file  . Food insecurity    Worry: Not on file    Inability: Not on file  . Transportation needs    Medical: Not on file    Non-medical: Not on file  Tobacco Use  . Smoking status: Never Smoker  . Smokeless tobacco: Never Used  Substance and Sexual Activity  . Alcohol use: No    Alcohol/week: 0.0 standard drinks  . Drug use: No  . Sexual activity: Not Currently    Comment: divorced, lives with daughter and granddaughters  Lifestyle  . Physical activity    Days per week: Not on file    Minutes per session: Not on file  . Stress: Not on file  Relationships  . Social Herbalist on phone: Not on file    Gets together: Not on file    Attends religious service: Not on file    Active member of club or organization: Not on file    Attends meetings of clubs or organizations: Not on file    Relationship status: Not on file  Other Topics Concern  . Not on file  Social History Narrative   Works in a group home   Lives with daughter   Two grand daughters    One son home most of the time    Review of Systems: Gen: Denies fever, chills, anorexia. Denies  fatigue, weakness, weight loss.  CV: Denies chest pain, palpitations, syncope, peripheral edema, and claudication. Resp: Denies dyspnea at rest, cough, wheezing, coughing up blood, and pleurisy. GI: see HPI Derm: Denies rash, itching, dry skin Psych: Denies depression, anxiety, memory loss,  confusion. No homicidal or suicidal ideation.  Heme: Denies bruising, bleeding, and enlarged lymph nodes.  Physical Exam: BP (!) 161/71   Pulse 73   Temp (!) 97 F (36.1 C) (Oral)   Ht 5\' 4"  (1.626 m)   Wt 250 lb 3.2 oz (113.5 kg)   BMI 42.95 kg/m  General:   Alert and oriented. No distress noted. Pleasant and cooperative.  Head:  Normocephalic and atraumatic. Eyes:  Conjuctiva clear without scleral icterus. Abdomen:  +BS, soft, non-tender and non-distended. No rebound or guarding. No HSM or masses noted. Msk:  Lower extremity edema bilaterally, non-pitting.  Neurologic:  Alert and  oriented x4 Psych:  Alert and cooperative. Normal mood and affect.

## 2019-03-02 NOTE — Patient Instructions (Signed)
Continue Protonix as needed but make sure it is taken 30 minutes before a meal on an empty stomach.  I will see you in January 2021!  Have a wonderful holiday season!  I enjoyed seeing you again today! As you know, I value our relationship and want to provide genuine, compassionate, and quality care. I welcome your feedback. If you receive a survey regarding your visit,  I greatly appreciate you taking time to fill this out. See you next time!  Annitta Needs, PhD, ANP-BC [facility]

## 2019-03-02 NOTE — Assessment & Plan Note (Signed)
Doing well with Protonix prn now. Overall, doing well from a GI standpoint. Desires to follow-up every 3 months. Return in Jan 2021.

## 2019-03-11 ENCOUNTER — Other Ambulatory Visit: Payer: Self-pay | Admitting: Family Medicine

## 2019-03-24 ENCOUNTER — Ambulatory Visit (INDEPENDENT_AMBULATORY_CARE_PROVIDER_SITE_OTHER): Payer: Medicare Other | Admitting: Family Medicine

## 2019-03-24 ENCOUNTER — Encounter: Payer: Self-pay | Admitting: Family Medicine

## 2019-03-24 ENCOUNTER — Other Ambulatory Visit: Payer: Self-pay

## 2019-03-24 DIAGNOSIS — R21 Rash and other nonspecific skin eruption: Secondary | ICD-10-CM

## 2019-03-24 MED ORDER — PREDNISONE 20 MG PO TABS: ORAL_TABLET | ORAL | 0 refills | Status: DC

## 2019-03-24 MED ORDER — FAMOTIDINE 20 MG PO TABS: 20.0000 mg | ORAL_TABLET | Freq: Two times a day (BID) | ORAL | 0 refills | Status: DC

## 2019-03-24 NOTE — Progress Notes (Signed)
Virtual Visit via telephone Note Due to COVID-19 pandemic this visit was conducted virtually. This visit type was conducted due to national recommendations for restrictions regarding the COVID-19 Pandemic (e.g. social distancing, sheltering in place) in an effort to limit this patient's exposure and mitigate transmission in our community. All issues noted in this document were discussed and addressed.  A physical exam was not performed with this format.   I connected with Shelly [name] on 03/24/2019 at 1120 by telephone and verified that I am speaking with the correct person using two identifiers. Shelly [name] is currently located at home and no one is currently with them during visit. The provider, Monia Pouch, FNP is located in their office at time of visit.  I discussed the limitations, risks, security and privacy concerns of performing an evaluation and management service by telephone and the availability of in person appointments. I also discussed with the patient that there may be a patient responsible charge related to this service. The patient expressed understanding and agreed to proceed.  Subjective:  Patient ID: Shelly [name], female    DOB: 08/17/1940, 78 y.o.   MRN: [id number]  Chief Complaint:  Rash   HPI: Shelly [name] is a 78 y.o. female presenting on 03/24/2019 for Rash   Pt reports a rough rash to face and neck. States this started a few days ago and is not improving. No angioedema, voice changes, or trouble swallowing. Does have upcoming appointment with dermatology.   Rash This is a recurrent problem. The current episode started in the past 7 days. The problem is unchanged. The affected locations include the neck and face. The rash is characterized by itchiness and redness. She was exposed to nothing. Pertinent negatives include no anorexia, congestion, cough, diarrhea, eye pain, facial edema, fatigue, fever, joint pain, nail changes, rhinorrhea, shortness  of breath, sore throat or vomiting. Past treatments include antihistamine. The treatment provided no relief.     Relevant past medical, surgical, family, and social history reviewed and updated as indicated.  Allergies and medications reviewed and updated.   Past Medical History:  Diagnosis Date   Allergy    Anemia    GI bleed   Anginal pain ([place])    Anxiety    Arthritis    Blood transfusion without reported diagnosis    Cataract    Chronic kidney disease    Coronary artery disease    SEVERE   GERD (gastroesophageal reflux disease)    GI bleed 12/2013   Goiter    Hyperlipidemia    Hypertension    Myocardial infarct [facility])    Myocardial infarction ([place]) 08/2013   Neuralgia of right lower extremity 07/13/2017   Pancolonic diverticulosis 12/2013    Past Surgical History:  Procedure Laterality Date   COLONOSCOPY N/A 01/13/2014   Dr. Hung:pandiverticulosis    COLONOSCOPY N/A 12/14/2015   Dr. Michail Sermon: pancolonic diverticulosis, internal hemorrhoids    CORNEAL TRANSPLANT     CORONARY ANGIOPLASTY  08/2013   ESOPHAGOGASTRODUODENOSCOPY N/A 12/14/2015   Dr. Michail Sermon: normal    GIVENS CAPSULE STUDY N/A 08/13/2016   Dr. Oneida Alar: enteritis due to aspirin.    HEEL SPUR EXCISION     LEFT HEART CATHETERIZATION WITH CORONARY ANGIOGRAM N/A 08/23/2013   Procedure: LEFT HEART CATHETERIZATION WITH CORONARY ANGIOGRAM;  Surgeon: Peter M Martinique, MD;  Location[facility] LAB;  Service: Cardiovascular;  Laterality: N/A;   PERCUTANEOUS CORONARY STENT INTERVENTION (PCI-S) N/A 08/24/2013   Procedure: PERCUTANEOUS CORONARY STENT INTERVENTION (  PCI-S);  Surgeon: Peter M Martinique, MD;  Location: [facility] CATH LAB;  Service: Cardiovascular;  Laterality: N/A;   TONSILLECTOMY     TUBAL LIGATION      Social History   Socioeconomic History   Marital status: Widowed    Spouse name: Not on file   Number of children: 8   Years of education: 11   Highest education level: Not on file    Occupational History   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  Social Designer, fashion/clothing strain: Not on file   Food insecurity    Worry: Not on file    Inability: Not on file   Transportation needs    Medical: Not on file    Non-medical: Not on file  Tobacco Use   Smoking status: Never Smoker   Smokeless tobacco: Never Used  Substance and Sexual Activity   Alcohol use: No    Alcohol/week: 0.0 standard drinks   Drug use: No   Sexual activity: Not Currently    Comment: divorced, lives with daughter and granddaughters  Lifestyle   Physical activity    Days per week: Not on file    Minutes per session: Not on file   Stress: Not on file  Relationships   Social connections    Talks on phone: Not on file    Gets together: Not on file    Attends religious service: Not on file    Active member of club or organization: Not on file    Attends meetings of clubs or organizations: Not on file    Relationship status: Not on file   Intimate partner violence    Fear of current or ex partner: Not on file    Emotionally abused: Not on file    Physically abused: Not on file    Forced sexual activity: Not on file  Other Topics Concern   Not on file  Social History Narrative   Works in a group home   Lives with daughter   Two grand daughters    One son home most of the time    Outpatient Encounter Medications as of 03/24/2019  Medication Sig   b complex vitamins capsule Take 1 capsule by mouth daily.   cetirizine (ZYRTEC) 10 MG tablet Take 10 mg by mouth daily.   chlorthalidone (HYGROTON) 25 MG tablet Take 1 tablet (25 mg total) by mouth daily.   clopidogrel (PLAVIX) 75 MG tablet TAKE ONE TABLET BY MOUTH DAILY   clotrimazole-betamethasone (LOTRISONE) cream APPLY AS DIRECTED TWICE DAILY.   famotidine (PEPCID) 20 MG tablet Take 1 tablet (20 mg total) by mouth 2 (two) times daily for 14 days.   HYDROcodone-acetaminophen (NORCO/VICODIN) 5-325 MG  tablet Take 1-2 tablets by mouth 2 (two) times daily as needed for moderate pain.   lidocaine (XYLOCAINE) 5 % ointment APPLY AS DIRECTED AS NEEDED.   lisinopril (ZESTRIL) 40 MG tablet Take 1 tablet (40 mg total) by mouth daily.   metoprolol succinate (TOPROL XL) 25 MG 24 hr tablet Take 1 tablet (25 mg total) by mouth daily.   Multiple Vitamin (MULTIVITAMIN) tablet Take 1 tablet by mouth daily.   pantoprazole (PROTONIX) 40 MG tablet Take 1 tablet (40 mg total) by mouth daily. 30 minutes before breakfast   predniSONE (DELTASONE) 20 MG tablet 2 po at sametime daily for 5 days   pregabalin (LYRICA) 100 MG capsule TAKE ONE CAPSULE BY MOUTH TWICE DAILY   rosuvastatin (CRESTOR) 5 MG tablet Take  1 tablet (5 mg total) by mouth daily.   No facility-administered encounter medications on file as of 03/24/2019.     Allergies  Allergen Reactions   Nsaids Other (See Comments)    AVOID all NSAID due to history of GI bleeding   Atorvastatin Other (See Comments)    Joint & muscle pain    Review of Systems  Constitutional: Negative for activity change, appetite change, chills, diaphoresis, fatigue, fever and unexpected weight change.  HENT: Negative for congestion, rhinorrhea, sore throat, trouble swallowing and voice change.   Eyes: Negative for photophobia, pain and visual disturbance.  Respiratory: Negative for apnea, cough, choking, chest tightness, shortness of breath, wheezing and stridor.   Cardiovascular: Negative for chest pain, palpitations and leg swelling.  Gastrointestinal: Negative for abdominal pain, anorexia, diarrhea and vomiting.  Musculoskeletal: Negative for joint pain.  Skin: Positive for rash. Negative for nail changes.  Neurological: Negative for dizziness, syncope, weakness, light-headedness and headaches.  Psychiatric/Behavioral: Negative for confusion.  All other systems reviewed and are negative.        Observations/Objective: No vital signs or physical exam,  this was a telephone or virtual health encounter.  Pt alert and oriented, answers all questions appropriately, and able to speak in full sentences.    Assessment and Plan: Shelly [name] was seen today for rash.  Diagnoses and all orders for this visit:  Rash and nonspecific skin eruption Rash of unknown etiology. Has follow up with dermatology next week. Will place on below along with Zyrtec do to facial involvement. No red flags concerning for anaphylaxis. Medications as prescribed. Report any new or worsening symptoms.  -     predniSONE (DELTASONE) 20 MG tablet; 2 po at sametime daily for 5 days -     famotidine (PEPCID) 20 MG tablet; Take 1 tablet (20 mg total) by mouth 2 (two) times daily for 14 days.     Follow Up Instructions: Return if symptoms worsen or fail to improve.    I discussed the assessment and treatment plan with the patient. The patient was provided an opportunity to ask questions and all were answered. The patient agreed with the plan and demonstrated an understanding of the instructions.   The patient was advised to call back or seek an in-person evaluation if the symptoms worsen or if the condition fails to improve as anticipated.  The above assessment and management plan was discussed with the patient. The patient verbalized understanding of and has agreed to the management plan. Patient is aware to call the clinic if they develop any new symptoms or if symptoms persist or worsen. Patient is aware when to return to the clinic for a follow-up visit. Patient educated on when it is appropriate to go to the emergency department.    I provided 15 minutes of non-face-to-face time during this encounter. The call started at 1120. The call ended at 1135. The other time was used for coordination of care.    Monia Pouch, FNP-C [facility] [address] [place], [place] [address] [phone] 03/24/2019

## 2019-03-29 ENCOUNTER — Other Ambulatory Visit: Payer: Self-pay

## 2019-03-29 ENCOUNTER — Ambulatory Visit: Payer: Medicare Other | Admitting: Neurology

## 2019-03-29 ENCOUNTER — Encounter: Payer: Self-pay | Admitting: Neurology

## 2019-03-29 VITALS — BP 140/68 | HR 77 | Temp 97.4°F | Ht 64.0 in | Wt 249.5 lb

## 2019-03-29 DIAGNOSIS — B0229 Other postherpetic nervous system involvement: Secondary | ICD-10-CM | POA: Diagnosis not present

## 2019-03-29 MED ORDER — DICLOFENAC SODIUM 3 % TD GEL: 4.0000 g | Freq: Four times a day (QID) | TRANSDERMAL | 11 refills | Status: DC | PRN

## 2019-03-29 MED ORDER — LIDOCAINE-PRILOCAINE 2.5-2.5 % EX CREA: 1.0000 "application " | TOPICAL_CREAM | Freq: Four times a day (QID) | CUTANEOUS | 11 refills | Status: DC | PRN

## 2019-03-29 MED ORDER — OXCARBAZEPINE 150 MG PO TABS: 150.0000 mg | ORAL_TABLET | Freq: Two times a day (BID) | ORAL | 11 refills | Status: DC

## 2019-03-29 NOTE — Progress Notes (Signed)
PATIENT: Shelly [name] DOB: Apr 06, 1941  Chief Complaint  Patient presents with  . Postherpetic Neuralgia    She is here with her daughter, [name].  Reports having shingles in November 2019.  She is still having discomfort on her upper back and under her right arm.  She is taking Lyrica 100mg  BID and lidocaine ointment.  Says the medication is not very helpful.  Marland Kitchen PCP    [name], [name], MD     HISTORICAL  Shelly [name] is a 78 year old female, accompanied by her daughter Demetra Shiner, seen in request by her primary care physician Dr. Warrick Parisian, Vonna Kotyk for evaluation of postherpetic neuralgia, initial evaluation was on March 29, 2019.  I have reviewed and summarized the referring note from the referring physician.  She had a past medical history of hypertension, hyperlipidemia, diabetes, she suffered a severe outbreak of herpes involving right T1-T6 in 2019, had rash broke out extending to right armpit, she been suffering postherpetic neuralgia since the incident, complains underneath skin crawling sensation, electronic shooting pain, present 24/7, the pain can go up to 9 out of 10, she has difficulty sleeping, was treated with gabapentin with limited help, not taking Lyrica 100 mg every night, which did help her sleep but only for few hours, she complains of drowsiness during the day while taking Lyrica.  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  ALLERGIES: Allergies  Allergen Reactions  . Nsaids Other (See Comments)    AVOID all NSAID due to history of GI bleeding  . Atorvastatin Other (See Comments)    Joint & muscle pain    HOME MEDICATIONS: Current  Outpatient Medications  Medication Sig Dispense Refill  . b complex vitamins capsule Take 1 capsule by mouth daily.    . cetirizine (ZYRTEC) 10 MG tablet Take 10 mg by mouth daily.    . clopidogrel (PLAVIX) 75 MG tablet TAKE ONE TABLET BY MOUTH DAILY 90 tablet 3  . clotrimazole-betamethasone (LOTRISONE) cream APPLY AS DIRECTED TWICE DAILY. 45 g 1  . famotidine (PEPCID) 20 MG tablet Take 1 tablet (20 mg total) by mouth 2 (two) times daily for 14 days. 28 tablet 0  . HYDROcodone-acetaminophen (NORCO/VICODIN) 5-325 MG tablet Take 1-2 tablets by mouth 2 (two) times daily as needed for moderate pain. 60 tablet 0  . lidocaine (XYLOCAINE) 5 % ointment APPLY AS DIRECTED AS NEEDED. 35.44 g 1  . lisinopril (ZESTRIL) 40 MG tablet Take 1 tablet (40 mg total) by mouth daily. 90 tablet 3  . metoprolol succinate (TOPROL XL) 25 MG 24 hr tablet Take 1 tablet (25 mg total) by mouth daily. 90 tablet 3  . Multiple Vitamin (MULTIVITAMIN) tablet Take 1 tablet by mouth daily.    . pantoprazole (PROTONIX) 40 MG tablet Take 1 tablet (40 mg total) by mouth daily. 30 minutes before breakfast 90 tablet 3  . pregabalin (LYRICA) 100 MG capsule TAKE ONE CAPSULE BY MOUTH TWICE DAILY 60 capsule 1  . chlorthalidone (HYGROTON) 25 MG tablet Take 1 tablet (25 mg total) by mouth daily. 90 tablet 3  . rosuvastatin (CRESTOR) 5 MG tablet Take 1 tablet (5 mg total) by mouth  daily. 90 tablet 3   No current facility-administered medications for this visit.     PAST MEDICAL HISTORY: Past Medical History:  Diagnosis Date  . Allergy   . Anemia    GI bleed  . Anginal pain ([place])   . Anxiety   . Arthritis   . Blood transfusion without reported diagnosis   . Cataract   . Chronic kidney disease   . Coronary artery disease    SEVERE  . GERD (gastroesophageal reflux disease)   . GI bleed 12/2013  . Goiter   . Hyperlipidemia   . Hypertension   . Myocardial infarct ([place])   . Myocardial infarction ([place]) 08/2013  . Neuralgia of right  lower extremity 07/13/2017  . Pancolonic diverticulosis 12/2013  . Post herpetic neuralgia     PAST SURGICAL HISTORY: Past Surgical History:  Procedure Laterality Date  . COLONOSCOPY N/A 01/13/2014   Dr. Hung:pandiverticulosis   . COLONOSCOPY N/A 12/14/2015   Dr. Michail Sermon: pancolonic diverticulosis, internal hemorrhoids   . CORNEAL TRANSPLANT    . CORONARY ANGIOPLASTY  08/2013  . ESOPHAGOGASTRODUODENOSCOPY N/A 12/14/2015   Dr. Michail Sermon: normal   . GIVENS CAPSULE STUDY N/A 08/13/2016   Dr. Oneida Alar: enteritis due to aspirin.   Marland Kitchen HEEL SPUR EXCISION    . LEFT HEART CATHETERIZATION WITH CORONARY ANGIOGRAM N/A 08/23/2013   Procedure: LEFT HEART CATHETERIZATION WITH CORONARY ANGIOGRAM;  Surgeon: Peter M Martinique, MD;  Location: [facility] CATH LAB;  Service: Cardiovascular;  Laterality: N/A;  . PERCUTANEOUS CORONARY STENT INTERVENTION (PCI-S) N/A 08/24/2013   Procedure: PERCUTANEOUS CORONARY STENT INTERVENTION (PCI-S);  Surgeon: Peter M Martinique, MD;  Location: [facility] CATH LAB;  Service: Cardiovascular;  Laterality: N/A;  . TONSILLECTOMY    . TUBAL LIGATION      FAMILY HISTORY: Family History  Problem Relation Age of Onset  . Hypertension Brother   . Transient ischemic attack Brother   . Cancer Mother        unsure of type   . Other Father        grangrene   . Asthma Sister   . Stroke Sister   . COPD Sister   . Stroke Sister   . Early death Brother   . Early death Brother   . Liver cancer Daughter   . Cancer Daughter   . Hernia Son        Umbilical  . GI problems Son   . Pancreatic disease Daughter        pancreatectomy  . Cancer Daughter   . Diverticulitis Daughter   . Arthritis Daughter        knee   . Arthritis Daughter        shoulder   . Anemia Daughter   . GER disease Son     SOCIAL HISTORY: Social History   Socioeconomic History  . Marital status: Widowed    Spouse name: Not on file  . Number of children: 8  . Years of education: 85  . Highest education level: Not on file   Occupational History  . 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 Financial resource strain: Not on file  . Food insecurity    Worry: Not on file    Inability: Not on file  . Transportation needs    Medical: Not on file    Non-medical: Not on file  Tobacco Use  . Smoking status: Never Smoker  . Smokeless tobacco: Never Used  Substance and Sexual Activity  .  Alcohol use: No    Alcohol/week: 0.0 standard drinks  . Drug use: No  . Sexual activity: Not Currently    Comment: divorced, lives with daughter and granddaughters  Lifestyle  . Physical activity    Days per week: Not on file    Minutes per session: Not on file  . Stress: Not on file  Relationships  . Social Herbalist on phone: Not on file    Gets together: Not on file    Attends religious service: Not on file    Active member of club or organization: Not on file    Attends meetings of clubs or organizations: Not on file    Relationship status: Not on file  . Intimate partner violence    Fear of current or ex partner: Not on file    Emotionally abused: Not on file    Physically abused: Not on file    Forced sexual activity: Not on file  Other Topics Concern  . Not on file  Social History Narrative   Works in a group home   Lives with daughter   Two grand daughters    One son home most of the time   Right-handed.   No daily caffeine use.     PHYSICAL EXAM   Vitals:   03/29/19 1501  BP: 140/68  Pulse: 77  Temp: (!) 97.4 F (36.3 C)  Weight: 249 lb 8 oz (113.2 kg)  Height: 5\' 4"  (1.626 m)    Not recorded      Body mass index is 42.83 kg/m.  PHYSICAL EXAMNIATION:  Gen: NAD, conversant, well nourised, well groomed                     Cardiovascular: Regular rate rhythm, no peripheral edema, warm, nontender. Eyes: Conjunctivae clear without exudates or hemorrhage Neck: Supple, no carotid bruits. Pulmonary: Clear to auscultation bilaterally  Skin: Right upper  thoracic dark brown discoloration rash, sensitive to touch  NEUROLOGICAL EXAM:  MENTAL STATUS: Speech:    Speech is normal; fluent and spontaneous with normal comprehension.  Cognition:     Orientation to time, place and person     Normal recent and remote memory     Normal Attention span and concentration     Normal Language, naming, repeating,spontaneous speech     Fund of knowledge   CRANIAL NERVES: CN II: Visual fields are full to confrontation.  Pupils are round equal and briskly reactive to light. CN III, IV, VI: extraocular movement are normal. No ptosis. CN V: Facial sensation is intact to pinprick in all 3 divisions bilaterally. Corneal responses are intact.  CN VII: Face is symmetric with normal eye closure and smile. CN VIII: Hearing is normal to causal conversation. CN IX, X: Palate elevates symmetrically. Phonation is normal. CN XI: Head turning and shoulder shrug are intact CN XII: Tongue is midline with normal movements and no atrophy.  MOTOR: There is no pronator drift of out-stretched arms. Muscle bulk and tone are normal. Muscle strength is normal.  REFLEXES: Reflexes are 1 and symmetric at the biceps, triceps, knees, and ankles. Plantar responses are flexor.  SENSORY: Intact to light touch, pinprick and vibratory sensation are intact in fingers and toes.  COORDINATION: Rapid alternating movements and fine finger movements are intact. There is no dysmetria on finger-to-nose and heel-knee-shin.    GAIT/STANCE: She needs push-up to get up from seated position, mildly antalgic,  DIAGNOSTIC DATA (LABS, IMAGING, TESTING) -  I reviewed patient records, labs, notes, testing and imaging myself where available.   ASSESSMENT AND PLAN  DESHUNDA DUDECK is a 78 y.o. female   Postherpetic neuralgia  Involving right T1-T6 dermatomes  Continue Lyrica 100 mg twice daily  Add on Trileptal 150 mg twice daily, may staggered with Lyrica for better pain control  Emla  cream plus diclofenac cream for right thoracic region   Marcial Pacas, M.D. Ph.D.  [facility] [address] [place], [place] [address] Ph: [phone] Fax: [phone]  CC: [name], [name], MD

## 2019-03-30 ENCOUNTER — Other Ambulatory Visit: Payer: Self-pay | Admitting: *Deleted

## 2019-03-30 MED ORDER — DICLOFENAC SODIUM 1 % EX GEL: 4.0000 g | Freq: Four times a day (QID) | CUTANEOUS | 5 refills | Status: DC | PRN

## 2019-04-05 ENCOUNTER — Other Ambulatory Visit: Payer: Self-pay

## 2019-04-06 ENCOUNTER — Ambulatory Visit (INDEPENDENT_AMBULATORY_CARE_PROVIDER_SITE_OTHER): Payer: Medicare Other | Admitting: Family Medicine

## 2019-04-06 ENCOUNTER — Encounter: Payer: Self-pay | Admitting: Family Medicine

## 2019-04-06 VITALS — BP 128/69 | HR 68 | Temp 97.3°F | Ht 64.0 in | Wt 250.2 lb

## 2019-04-06 DIAGNOSIS — I10 Essential (primary) hypertension: Secondary | ICD-10-CM

## 2019-04-06 DIAGNOSIS — B0229 Other postherpetic nervous system involvement: Secondary | ICD-10-CM | POA: Diagnosis not present

## 2019-04-06 DIAGNOSIS — K219 Gastro-esophageal reflux disease without esophagitis: Secondary | ICD-10-CM

## 2019-04-06 DIAGNOSIS — E0590 Thyrotoxicosis, unspecified without thyrotoxic crisis or storm: Secondary | ICD-10-CM

## 2019-04-06 DIAGNOSIS — N1832 Chronic kidney disease, stage 3b: Secondary | ICD-10-CM | POA: Diagnosis not present

## 2019-04-06 DIAGNOSIS — R400 Somnolence: Secondary | ICD-10-CM

## 2019-04-06 MED ORDER — PREGABALIN 100 MG PO CAPS: 100.0000 mg | ORAL_CAPSULE | Freq: Every evening | ORAL | 5 refills | Status: DC | PRN

## 2019-04-06 NOTE — Progress Notes (Signed)
BP 128/69    Pulse 68    Temp (!) 97.3 F (36.3 C) (Temporal)    Ht _0  (1.626 m)    Wt 250 lb 3.2 oz (113.5 kg)    SpO2 98%    BMI 42.95 kg/m    Subjective:   Patient ID: Shelly [name], female    DOB: [birth year], 78 y.o.   MRN: [id number]  HPI: Shelly [name] is a 78 y.o. female presenting on 04/06/2019 for Hypertension (3 month follow up)   HPI Hypertension Patient is currently on chlorthalidone and lisinopril and metoprolol, and their blood pressure today is 28/60. Patient denies any lightheadedness or dizziness. Patient denies headaches, blurred vision, chest pains, shortness of breath, or weakness. Denies any side effects from medication and is content with current medication.   Check of CKD Patient is coming in for recheck of CKD and will check blood work today.  GERD Patient is currently on pantoprazole.  She denies any major symptoms or abdominal pain or belching or burping. She denies any blood in her stool or lightheadedness or dizziness.   Postherpetic neuralgia recheck She says the Lyrica is helping at night but not as much during the day, she says the lidocaine cream is helping more at night and daytime with the pain than anything else that she is tried.  She is using the Voltaren gel on her knee with none anywhere else. Current rx-Lyrica 100 mg nightly. # meds rx-30 Effectiveness of current meds-helps with sleep but not as much during the daytime, uses lidocaine cream during the daytime Adverse reactions form meds-none  Pill count performed-No Last drug screen -10/06/2018 ( high risk q28m moderate risk q64mlow risk yearly ) Urine drug screen today- No [place]  If yes were their any concerning findings? -None  No flowsheet data found.   Controlled substance contract signed on: 10/06/2018  Relevant past medical, surgical, family and social history reviewed and updated as indicated. Interim medical history since our last visit  reviewed. Allergies and medications reviewed and updated.  Review of Systems  Constitutional: Negative for chills and fever.  Eyes: Negative for visual disturbance.  Respiratory: Negative for chest tightness and shortness of breath.   Cardiovascular: Negative for chest pain, palpitations and leg swelling.  Gastrointestinal: Negative for abdominal pain.  Genitourinary: Negative for difficulty urinating and dysuria.  Musculoskeletal: Negative for back pain and gait problem.  Skin: Negative for rash.  Neurological: Positive for numbness. Negative for weakness, light-headedness and headaches.  Psychiatric/Behavioral: Negative for agitation and behavioral problems.  All other systems reviewed and are negative.   Per HPI unless specifically indicated above   Allergies as of 04/06/2019      Reactions   Nsaids Other (See Comments)   AVOID all NSAID due to history of GI bleeding   Atorvastatin Other (See Comments)   Joint & muscle pain      Medication List       Accurate as of April 06, 2019  3:58 PM. If you have any questions, ask your nurse or doctor.        STOP taking these medications   famotidine 20 MG tablet Commonly known as: Pepcid Stopped by: JoWorthy RancherMD   HYDROcodone-acetaminophen 5-325 MG tablet Commonly known as: NORCO/VICODIN Stopped by: JoFransisca Kaufmannettinger, MD     TAKE these medications   b complex vitamins capsule Take 1 capsule by mouth daily.   cetirizine 10 MG tablet Commonly known as:  ZYRTEC Take 10 mg by mouth daily.   chlorthalidone 25 MG tablet Commonly known as: HYGROTON Take 1 tablet (25 mg total) by mouth daily.   clopidogrel 75 MG tablet Commonly known as: PLAVIX TAKE ONE TABLET BY MOUTH DAILY   clotrimazole-betamethasone cream Commonly known as: LOTRISONE APPLY AS DIRECTED TWICE DAILY.   diclofenac Sodium 1 % Gel Commonly known as: VOLTAREN Apply 4 g topically 4 (four) times daily as needed.   lidocaine 5 %  ointment Commonly known as: XYLOCAINE APPLY AS DIRECTED AS NEEDED.   lidocaine-prilocaine cream Commonly known as: EMLA Apply 1 application topically 4 (four) times daily as needed.   lisinopril 40 MG tablet Commonly known as: ZESTRIL Take 1 tablet (40 mg total) by mouth daily.   metoprolol succinate 25 MG 24 hr tablet Commonly known as: Toprol XL Take 1 tablet (25 mg total) by mouth daily.   multivitamin tablet Take 1 tablet by mouth daily.   OXcarbazepine 150 MG tablet Commonly known as: Trileptal Take 1 tablet (150 mg total) by mouth 2 (two) times daily.   pantoprazole 40 MG tablet Commonly known as: PROTONIX Take 1 tablet (40 mg total) by mouth daily. 30 minutes before breakfast   pregabalin 100 MG capsule Commonly known as: LYRICA Take 1 capsule (100 mg total) by mouth at bedtime as needed. What changed:   when to take this  reasons to take this Changed by: [name], MD   rosuvastatin 5 MG tablet Commonly known as: CRESTOR Take 1 tablet (5 mg total) by mouth daily.        Objective:   BP 128/69    Pulse 68    Temp (!) 97.3 F (36.3 C) (Temporal)    Ht _0  (1.626 m)    Wt 250 lb 3.2 oz (113.5 kg)    SpO2 98%    BMI 42.95 kg/m   Wt Readings from Last 3 Encounters:  04/06/19 250 lb 3.2 oz (113.5 kg)  03/29/19 249 lb 8 oz (113.2 kg)  03/02/19 250 lb 3.2 oz (113.5 kg)    Physical Exam Vitals signs and nursing note reviewed.  Constitutional:      General: She is not in acute distress.    Appearance: She is well-developed. She is not diaphoretic.  Eyes:     Conjunctiva/sclera: Conjunctivae normal.  Cardiovascular:     Rate and Rhythm: Normal rate and regular rhythm.     Heart sounds: Normal heart sounds. No murmur.  Pulmonary:     Effort: Pulmonary effort is normal. No respiratory distress.     Breath sounds: Normal breath sounds. No wheezing.  Musculoskeletal: Normal range of motion.        General: No tenderness.  Skin:    General:  Skin is warm and dry.     Findings: No rash.  Neurological:     Mental Status: She is alert and oriented to person, place, and time.     Coordination: Coordination normal.  Psychiatric:        Behavior: Behavior normal.       Assessment & Plan:   Problem List Items Addressed This Visit      Cardiovascular and Mediastinum   Essential hypertension   Relevant Orders   CMP14+EGFR   TSH     Digestive   GERD (gastroesophageal reflux disease) - Primary   Relevant Orders   CBC with Differential/Platelet     Nervous and Auditory   Postherpetic neuralgia   Relevant Medications  pregabalin (LYRICA) 100 MG capsule   Other Relevant Orders   Vitamin B12   TSH     Genitourinary   CKD (chronic kidney disease), stage III   Relevant Orders   CMP14+EGFR   TSH     Other   Morbid obesity ([place])   Relevant Orders   Lipid panel    Other Visit Diagnoses    Daytime somnolence       Relevant Orders   Ambulatory referral to Sleep Studies    Will do referral for sleep studies because of daytime somnolence and obesity.  Continue Lyrica at night and continue lidocaine cream to help with postherpetic neuralgia, will discontinue the pain medication because is not helping as much including the hydrocodone.  She is worried about daytime somnolence so that would be a good 1 to get rid of as well the hydrocodone.  Follow up plan: Return in about 3 months (around 07/07/2019), or if symptoms worsen or fail to improve, for Hypertension and neurology follow-up.  Counseling provided for all of the vaccine components Orders Placed This Encounter  Procedures   CBC with Differential/Platelet   CMP14+EGFR   Lipid panel   Vitamin B12   TSH   Ambulatory referral to Sleep Studies    Caryl Pina, MD [facility] 04/06/2019, 3:58 PM

## 2019-04-07 LAB — CMP14+EGFR
ALT: 8 IU/L (ref 0–32)
AST: 11 IU/L (ref 0–40)
Albumin/Globulin Ratio: 1.1 — ABNORMAL LOW (ref 1.2–2.2)
Albumin: 3.7 g/dL (ref 3.7–4.7)
Alkaline Phosphatase: 95 IU/L (ref 39–117)
BUN/Creatinine Ratio: 17 (ref 12–28)
BUN: 26 mg/dL (ref 8–27)
Bilirubin Total: <0.2 mg/dL (ref 0.0–1.2)
CO2: 21 mmol/L (ref 20–29)
Calcium: 9.9 mg/dL (ref 8.7–10.3)
Chloride: 109 mmol/L — ABNORMAL HIGH (ref 96–106)
Creatinine, Ser: 1.54 mg/dL — ABNORMAL HIGH (ref 0.57–1.00)
GFR calc Af Amer: 37 mL/min/{1.73_m2} — ABNORMAL LOW (ref >59)
GFR calc non Af Amer: 32 mL/min/{1.73_m2} — ABNORMAL LOW (ref >59)
Globulin, Total: 3.3 g/dL (ref 1.5–4.5)
Glucose: 93 mg/dL (ref 65–99)
Potassium: 5.6 mmol/L — ABNORMAL HIGH (ref 3.5–5.2)
Sodium: 143 mmol/L (ref 134–144)
Total Protein: 7 g/dL (ref 6.0–8.5)

## 2019-04-07 LAB — CBC WITH DIFFERENTIAL/PLATELET
Basophils Absolute: 0 10*3/uL (ref 0.0–0.2)
Basos: 1 %
EOS (ABSOLUTE): 0.1 10*3/uL (ref 0.0–0.4)
Eos: 1 %
Hematocrit: 32.3 % — ABNORMAL LOW (ref 34.0–46.6)
Hemoglobin: 10.6 g/dL — ABNORMAL LOW (ref 11.1–15.9)
Immature Grans (Abs): 0 10*3/uL (ref 0.0–0.1)
Immature Granulocytes: 0 %
Lymphocytes Absolute: 2.5 10*3/uL (ref 0.7–3.1)
Lymphs: 34 %
MCH: 30 pg (ref 26.6–33.0)
MCHC: 32.8 g/dL (ref 31.5–35.7)
MCV: 92 fL (ref 79–97)
Monocytes Absolute: 0.8 10*3/uL (ref 0.1–0.9)
Monocytes: 10 %
Neutrophils Absolute: 4 10*3/uL (ref 1.4–7.0)
Neutrophils: 54 %
Platelets: 276 10*3/uL (ref 150–450)
RBC: 3.53 x10E6/uL — ABNORMAL LOW (ref 3.77–5.28)
RDW: 13.4 % (ref 11.7–15.4)
WBC: 7.5 10*3/uL (ref 3.4–10.8)

## 2019-04-07 LAB — LIPID PANEL
Chol/HDL Ratio: 3.2 ratio (ref 0.0–4.4)
Cholesterol, Total: 168 mg/dL (ref 100–199)
HDL: 53 mg/dL (ref >39)
LDL Chol Calc (NIH): 101 mg/dL — ABNORMAL HIGH (ref 0–99)
Triglycerides: 72 mg/dL (ref 0–149)
VLDL Cholesterol Cal: 14 mg/dL (ref 5–40)

## 2019-04-07 LAB — TSH: TSH: 0.347 u[IU]/mL — ABNORMAL LOW (ref 0.450–4.500)

## 2019-04-07 LAB — VITAMIN B12: Vitamin B-12: 648 pg/mL (ref 232–1245)

## 2019-04-11 ENCOUNTER — Encounter: Payer: Self-pay | Admitting: Family Medicine

## 2019-04-11 NOTE — Addendum Note (Signed)
Addended by: Nigel Berthold C on: 04/11/2019 03:11 PM   Modules accepted: Orders

## 2019-04-20 ENCOUNTER — Telehealth: Payer: Self-pay | Admitting: Family Medicine

## 2019-04-20 NOTE — Chronic Care Management (AMB) (Signed)
Chronic Care Management   Note  04/20/2019 Name: Shelly [name] MRN: [id number] DOB: Feb 15, 1941  Shelly [name] is a 78 y.o. year old female who is a primary care patient of [name], [name], MD. I reached out to [name] by phone today in response to a referral sent by Ms. Jeannett Senior Baris's health plan.     Ms. Lucado was given information about Chronic Care Management services today including:  1. CCM service includes personalized support from designated clinical staff supervised by her physician, including individualized plan of care and coordination with other care providers 2. 24/7 contact phone numbers for assistance for urgent and routine care needs. 3. Service will only be billed when office clinical staff spend 20 minutes or more in a month to coordinate care. 4. Only one practitioner may furnish and bill the service in a calendar month. 5. The patient may stop CCM services at any time (effective at the end of the month) by phone call to the office staff. 6. The patient will be responsible for cost sharing (co-pay) of up to 20% of the service fee (after annual deductible is met).  Patient agreed to services and verbal consent obtained.   Follow up plan: Telephone appointment with CCM team member scheduled for: 05/16/2019  [place]  [place], [place] [address] Direct Dial: [email or web address]  Website: [email or web address]

## 2019-04-22 ENCOUNTER — Encounter: Payer: Self-pay | Admitting: Family Medicine

## 2019-04-22 ENCOUNTER — Ambulatory Visit (INDEPENDENT_AMBULATORY_CARE_PROVIDER_SITE_OTHER): Payer: Medicare Other | Admitting: Family Medicine

## 2019-04-22 DIAGNOSIS — M25561 Pain in right knee: Secondary | ICD-10-CM

## 2019-04-22 DIAGNOSIS — M25562 Pain in left knee: Secondary | ICD-10-CM | POA: Diagnosis not present

## 2019-04-22 DIAGNOSIS — G8929 Other chronic pain: Secondary | ICD-10-CM | POA: Diagnosis not present

## 2019-04-22 MED ORDER — PREDNISONE 10 MG PO TABS: ORAL_TABLET | ORAL | 0 refills | Status: DC

## 2019-04-22 NOTE — Progress Notes (Signed)
    Subjective:    Patient ID: Shelly [name], female    DOB: [birth year], 78 y.o.   MRN: [id number]   HPI: Shelly [name] is a 78 y.o. female presenting for bad pain in knee that goes to my butt. Last time I went to the hospital and they gave me a steroid shot and that helped a lot. Has pain bilaterally. R>L.    Depression screen [facility] 2/9 04/06/2019 12/31/2018 10/08/2018 09/28/2018 09/17/2018  Decreased Interest 0 0 1 0 0  Down, Depressed, Hopeless 0 0 1 1 1   PHQ - 2 Score 0 0 2 1 1   Altered sleeping - - 3 1 -  Tired, decreased energy - - 1 1 -  Change in appetite - - 1 0 -  Feeling bad or failure about yourself  - - 1 0 -  Trouble concentrating - - 0 1 -  Moving slowly or fidgety/restless - - 0 0 -  Suicidal thoughts - - 1 0 -  PHQ-9 Score - - 9 4 -  Difficult doing work/chores - - - Somewhat difficult -  Some recent data might be hidden     Relevant past medical, surgical, family and social history reviewed and updated as indicated.  Interim medical history since our last visit reviewed. Allergies and medications reviewed and updated.  ROS:  Review of Systems  Constitutional: Negative.   HENT: Negative.   Eyes: Negative for visual disturbance.  Respiratory: Negative for shortness of breath.   Cardiovascular: Negative for chest pain.  Gastrointestinal: Negative for abdominal pain.  Musculoskeletal: Positive for arthralgias.     Social History   Tobacco Use  Smoking Status Never Smoker  Smokeless Tobacco Never Used       Objective:     Wt Readings from Last 3 Encounters:  04/06/19 250 lb 3.2 oz (113.5 kg)  03/29/19 249 lb 8 oz (113.2 kg)  03/02/19 250 lb 3.2 oz (113.5 kg)     Exam deferred. Pt. Harboring due to COVID 19. Phone visit performed.   Assessment & Plan:  No diagnosis found.  Meds ordered this encounter  Medications  . predniSONE (DELTASONE) 10 MG tablet    Sig: Take 5 daily for 2 days followed by 4,3,2 and 1 for 2 days each.    Dispense:   30 tablet    Refill:  0    No orders of the defined types were placed in this encounter.     There are no diagnoses linked to this encounter.  Virtual Visit via telephone Note  I discussed the limitations, risks, security and privacy concerns of performing an evaluation and management service by telephone and the availability of in person appointments. The patient was identified with two identifiers. Pt.expressed understanding and agreed to proceed. Pt. Is at home. Dr. Livia Snellen is in his office.  Follow Up Instructions:   I discussed the assessment and treatment plan with the patient. The patient was provided an opportunity to ask questions and all were answered. The patient agreed with the plan and demonstrated an understanding of the instructions.   The patient was advised to call back or seek an in-person evaluation if the symptoms worsen or if the condition fails to improve as anticipated.   Total minutes including chart review and phone contact time: 8   Follow up plan: No follow-ups on file.  Claretta Fraise, MD [place]

## 2019-04-25 ENCOUNTER — Telehealth: Payer: Self-pay | Admitting: Neurology

## 2019-04-25 NOTE — Telephone Encounter (Signed)
LVM to r/s 1/13 due to MD being out

## 2019-04-26 ENCOUNTER — Telehealth: Payer: Self-pay | Admitting: Family Medicine

## 2019-04-26 NOTE — Telephone Encounter (Signed)
Pt has had both Prevnar and Pneumovax after the age of 50 so no need for another pneumonia vaccine and pt is aware.

## 2019-05-02 ENCOUNTER — Ambulatory Visit: Payer: Medicare Other | Admitting: "Endocrinology

## 2019-05-16 ENCOUNTER — Ambulatory Visit: Payer: Medicare Other | Admitting: *Deleted

## 2019-05-16 DIAGNOSIS — N1832 Chronic kidney disease, stage 3b: Secondary | ICD-10-CM

## 2019-05-16 DIAGNOSIS — I10 Essential (primary) hypertension: Secondary | ICD-10-CM

## 2019-05-16 NOTE — Chronic Care Management (AMB) (Signed)
  Chronic Care Management   Outreach Note  05/16/2019 Name: Shelly [name] MRN: [id number] DOB: 11/18/1940  Referred by: [name], [name], MD Reason for referral : Chronic Care Management (RN Initial Outreach)   An unsuccessful Initial CCM Telephone outreach was attempted today. The patient was referred to the case management team by for assistance with care management and care coordination.   Follow Up Plan: A HIPPA compliant phone message was left for the patient providing contact information and requesting a return call.  The care management team will reach out to the patient again over the next 30 days.   [name], BSN, RN-BC Embedded Chronic Care Manager [facility] Direct Dial: [phone]

## 2019-05-18 ENCOUNTER — Other Ambulatory Visit: Payer: Self-pay

## 2019-05-23 ENCOUNTER — Encounter: Payer: Self-pay | Admitting: Family Medicine

## 2019-05-23 ENCOUNTER — Ambulatory Visit (INDEPENDENT_AMBULATORY_CARE_PROVIDER_SITE_OTHER): Payer: Medicare Other | Admitting: Family Medicine

## 2019-05-23 DIAGNOSIS — R609 Edema, unspecified: Secondary | ICD-10-CM

## 2019-05-23 DIAGNOSIS — N1832 Chronic kidney disease, stage 3b: Secondary | ICD-10-CM | POA: Diagnosis not present

## 2019-05-23 DIAGNOSIS — I739 Peripheral vascular disease, unspecified: Secondary | ICD-10-CM

## 2019-05-23 MED ORDER — CHLORTHALIDONE 25 MG PO TABS: 25.0000 mg | ORAL_TABLET | Freq: Every day | ORAL | 1 refills | Status: DC

## 2019-05-23 NOTE — Progress Notes (Signed)
Subjective:    Patient ID: Shelly [name], female    DOB: 05/25/1940, 79 y.o.   MRN: [id number]   HPI: Shelly [name] is a 79 y.o. female presenting for increased welling in the feet and ankles over the last couple of weeks. She has ingested more salt than usual recently. She does not elevate the legs. She has not been taking a fluid pill. There is no pain associated with the swelling and she denies injury.    Depression screen [facility] 2/9 04/06/2019 12/31/2018 10/08/2018 09/28/2018 09/17/2018  Decreased Interest 0 0 1 0 0  Down, Depressed, Hopeless 0 0 1 1 1   PHQ - 2 Score 0 0 2 1 1   Altered sleeping - - 3 1 -  Tired, decreased energy - - 1 1 -  Change in appetite - - 1 0 -  Feeling bad or failure about yourself  - - 1 0 -  Trouble concentrating - - 0 1 -  Moving slowly or fidgety/restless - - 0 0 -  Suicidal thoughts - - 1 0 -  PHQ-9 Score - - 9 4 -  Difficult doing work/chores - - - Somewhat difficult -  Some recent data might be hidden     Relevant past medical, surgical, family and social history reviewed and updated as indicated.  Interim medical history since our last visit reviewed. Allergies and medications reviewed and updated.  ROS:  Review of Systems  Constitutional: Negative.   HENT: Negative for congestion.   Eyes: Negative for visual disturbance.  Respiratory: Negative for shortness of breath.   Cardiovascular: Positive for leg swelling. Negative for chest pain.  Gastrointestinal: Negative for abdominal pain, constipation, diarrhea, nausea and vomiting.  Genitourinary: Negative for difficulty urinating.  Musculoskeletal: Negative for arthralgias and myalgias.  Neurological: Negative for headaches.  Psychiatric/Behavioral: Negative for sleep disturbance.     Social History   Tobacco Use  Smoking Status Never Smoker  Smokeless Tobacco Never Used       Objective:     Wt Readings from Last 3 Encounters:  04/06/19 250 lb 3.2 oz (113.5 kg)  03/29/19  249 lb 8 oz (113.2 kg)  03/02/19 250 lb 3.2 oz (113.5 kg)     Exam deferred. Pt. Harboring due to COVID 19. Phone visit performed.   Assessment & Plan:   1. Dependent edema   2. PVD (peripheral vascular disease) ([place])   3. Stage 3b chronic kidney disease     Meds ordered this encounter  Medications  . chlorthalidone (HYGROTON) 25 MG tablet    Sig: Take 1 tablet (25 mg total) by mouth daily.    Dispense:  30 tablet    Refill:  1    Pt. Was advised to resume her fluid pill. Continue other meds as is. Elevate legs with ankles higher than her heart. Decrease sodium intake.    Diagnoses and all orders for this visit:  Dependent edema  PVD (peripheral vascular disease) ([place])  Stage 3b chronic kidney disease  Other orders -     chlorthalidone (HYGROTON) 25 MG tablet; Take 1 tablet (25 mg total) by mouth daily.    Virtual Visit via telephone Note  I discussed the limitations, risks, security and privacy concerns of performing an evaluation and management service by telephone and the availability of in person appointments. The patient was identified with two identifiers. Pt.expressed understanding and agreed to proceed. Pt. Is at home. Dr. Livia Snellen is in his office.  Follow Up Instructions:  I discussed the assessment and treatment plan with the patient. The patient was provided an opportunity to ask questions and all were answered. The patient agreed with the plan and demonstrated an understanding of the instructions.   The patient was advised to call back or seek an in-person evaluation if the symptoms worsen or if the condition fails to improve as anticipated.   Total minutes including chart review and phone contact time: 21   Follow up plan: Return in about 6 weeks (around 07/04/2019).  Claretta Fraise, MD [place]

## 2019-05-30 ENCOUNTER — Ambulatory Visit (INDEPENDENT_AMBULATORY_CARE_PROVIDER_SITE_OTHER): Payer: Medicare Other | Admitting: "Endocrinology

## 2019-05-30 ENCOUNTER — Encounter: Payer: Self-pay | Admitting: "Endocrinology

## 2019-05-30 ENCOUNTER — Other Ambulatory Visit: Payer: Self-pay

## 2019-05-30 VITALS — BP 135/74 | HR 69 | Ht 64.0 in | Wt 255.2 lb

## 2019-05-30 DIAGNOSIS — E0590 Thyrotoxicosis, unspecified without thyrotoxic crisis or storm: Secondary | ICD-10-CM

## 2019-05-30 NOTE — Progress Notes (Signed)
Endocrinology Consult Note                                            05/30/2019, 4:39 PM   Subjective:    Patient ID: Shelly [name], female    DOB: 11/25/1940, PCP [name], [name], MD   Past Medical History:  Diagnosis Date  . Allergy   . Anemia    GI bleed  . Anginal pain ([place])   . Anxiety   . Arthritis   . Blood transfusion without reported diagnosis   . Cataract   . Chronic kidney disease   . Coronary artery disease    SEVERE  . GERD (gastroesophageal reflux disease)   . GI bleed 12/2013  . Goiter   . Hyperlipidemia   . Hypertension   . Myocardial infarct ([place])   . Myocardial infarction ([place]) 08/2013  . Neuralgia of right lower extremity 07/13/2017  . Pancolonic diverticulosis 12/2013  . Post herpetic neuralgia    Past Surgical History:  Procedure Laterality Date  . COLONOSCOPY N/A 01/13/2014   Dr. Hung:pandiverticulosis   . COLONOSCOPY N/A 12/14/2015   Dr. Michail Sermon: pancolonic diverticulosis, internal hemorrhoids   . CORNEAL TRANSPLANT    . CORONARY ANGIOPLASTY  08/2013  . ESOPHAGOGASTRODUODENOSCOPY N/A 12/14/2015   Dr. Michail Sermon: normal   . GIVENS CAPSULE STUDY N/A 08/13/2016   Dr. Oneida Alar: enteritis due to aspirin.   Marland Kitchen HEEL SPUR EXCISION    . LEFT HEART CATHETERIZATION WITH CORONARY ANGIOGRAM N/A 08/23/2013   Procedure: LEFT HEART CATHETERIZATION WITH CORONARY ANGIOGRAM;  Surgeon: Peter M Martinique, MD;  Location: [facility] CATH LAB;  Service: Cardiovascular;  Laterality: N/A;  . PERCUTANEOUS CORONARY STENT INTERVENTION (PCI-S) N/A 08/24/2013   Procedure: PERCUTANEOUS CORONARY STENT INTERVENTION (PCI-S);  Surgeon: Peter M Martinique, MD;  Location: [facility] CATH LAB;  Service: Cardiovascular;  Laterality: N/A;  . TONSILLECTOMY    . TUBAL LIGATION     Social History   Socioeconomic History  . Marital status: Widowed    Spouse name: Not on file  . Number of children: 8  . Years of education: 51  . Highest education level: Not on file  Occupational History  .  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 Smoking status: Never Smoker  . Smokeless tobacco: Never Used  Substance and Sexual Activity  . Alcohol use: No    Alcohol/week: 0.0 standard drinks  . Drug use: No  . Sexual activity: Not Currently    Comment: divorced, lives with daughter and granddaughters  Other Topics Concern  . Not on file  Social History Narrative   Works in a group home   Lives with daughter   Two grand daughters    One son home most of the time   Right-handed.   No daily caffeine use.   Social Determinants of Health   Financial Resource Strain:   . Difficulty of Paying Living Expenses: Not on file  Food Insecurity:   . Worried About Charity fundraiser in the Last Year: Not on file  . Ran Out of Food in the Last Year: Not on file  Transportation Needs:   . Lack of Transportation (Medical): Not on file  . Lack of Transportation (Non-Medical): Not on file  Physical Activity:   . Days of Exercise per Week: Not on file  .  Minutes of Exercise per Session: Not on file  Stress:   . Feeling of Stress : Not on file  Social Connections:   . Frequency of Communication with Friends and Family: Not on file  . Frequency of Social Gatherings with Friends and Family: Not on file  . Attends Religious Services: Not on file  . Active Member of Clubs or Organizations: Not on file  . Attends Archivist Meetings: Not on file  . Marital Status: Not on file   Family History  Problem Relation Age of Onset  . Hypertension Brother   . Transient ischemic attack Brother   . Cancer Mother        unsure of type   . Other Father        grangrene   . Asthma Sister   . Stroke Sister   . COPD Sister   . Stroke Sister   . Early death Brother   . Early death Brother   . Liver cancer Daughter   . Cancer Daughter   . Hernia Son        Umbilical  . GI problems Son   . Pancreatic disease Daughter        pancreatectomy  . Cancer Daughter   .  Diverticulitis Daughter   . Arthritis Daughter        knee   . Arthritis Daughter        shoulder   . Anemia Daughter   . GER disease Son    Outpatient Encounter Medications as of 05/30/2019  Medication Sig  . b complex vitamins capsule Take 1 capsule by mouth daily.  . cetirizine (ZYRTEC) 10 MG tablet Take 10 mg by mouth daily.  . chlorthalidone (HYGROTON) 25 MG tablet Take 1 tablet (25 mg total) by mouth daily.  . chlorthalidone (HYGROTON) 25 MG tablet Take 1 tablet (25 mg total) by mouth daily.  . clopidogrel (PLAVIX) 75 MG tablet TAKE ONE TABLET BY MOUTH DAILY  . clotrimazole-betamethasone (LOTRISONE) cream APPLY AS DIRECTED TWICE DAILY.  Marland Kitchen diclofenac Sodium (VOLTAREN) 1 % GEL Apply 4 g topically 4 (four) times daily as needed.  . lidocaine (XYLOCAINE) 5 % ointment APPLY AS DIRECTED AS NEEDED.  Marland Kitchen lidocaine-prilocaine (EMLA) cream Apply 1 application topically 4 (four) times daily as needed.  Marland Kitchen lisinopril (ZESTRIL) 40 MG tablet Take 1 tablet (40 mg total) by mouth daily.  . metoprolol succinate (TOPROL XL) 25 MG 24 hr tablet Take 1 tablet (25 mg total) by mouth daily.  . Multiple Vitamin (MULTIVITAMIN) tablet Take 1 tablet by mouth daily.  . pantoprazole (PROTONIX) 40 MG tablet Take 1 tablet (40 mg total) by mouth daily. 30 minutes before breakfast  . rosuvastatin (CRESTOR) 5 MG tablet Take 1 tablet (5 mg total) by mouth daily.  . [DISCONTINUED] OXcarbazepine (TRILEPTAL) 150 MG tablet Take 1 tablet (150 mg total) by mouth 2 (two) times daily.  . [DISCONTINUED] pregabalin (LYRICA) 100 MG capsule Take 1 capsule (100 mg total) by mouth at bedtime as needed.   No facility-administered encounter medications on file as of 05/30/2019.   ALLERGIES: Allergies  Allergen Reactions  . Nsaids Other (See Comments)    AVOID all NSAID due to history of GI bleeding  . Atorvastatin Other (See Comments)    Joint & muscle pain    VACCINATION STATUS: Immunization History  Administered Date(s)  Administered  . Pneumococcal Conjugate-13 10/02/2016  . Pneumococcal Polysaccharide-23 08/25/2013  . Zoster Recombinat (Shingrix) 03/17/2018    HPI Shelly [name] is 79 y.o. female who presents today with a medical history as above. she is being seen in consultation for abnormal thyroid function test requested by [name], [name], MD.  History is obtained directly from the patient.  She denies any prior history of thyroid dysfunction.  Review of her medical records show suppressed TSH at least from 2015.  Most recent labs are from November 2020 showing suppressed TSH of 0.34, no free T4 or total T4 was measured simultaneously.  Patient denies weight change, palpitations, heat intolerance, tremors.  She denies any history of goiter, no family history of thyroid dysfunction or thyroid malignancy. She is not currently on antithyroid treatment nor thyroid hormone supplements.  Her medical history includes hypertension and hyperlipidemia on treatment.  Review of Systems  Constitutional:  + Progressive weight gain, + fatigue, no subjective hyperthermia, no subjective hypothermia Eyes: no blurry vision, no xerophthalmia ENT: no sore throat, no nodules palpated in throat, no dysphagia/odynophagia, no hoarseness Cardiovascular: no Chest Pain, no Shortness of Breath, no palpitations, no leg swelling Respiratory: no cough, no shortness of breath Gastrointestinal: no Nausea/Vomiting/Diarhhea Musculoskeletal:  + Arthritis,  +joint aches Skin: no rashes Neurological: no tremors, no numbness, no tingling, no dizziness Psychiatric: no depression, no anxiety  Objective:    BP 135/74   Pulse 69   Ht 5\' 4"  (1.626 m)   Wt 255 lb 3.2 oz (115.8 kg)   BMI 43.80 kg/m   Wt Readings from Last 3 Encounters:  05/30/19 255 lb 3.2 oz (115.8 kg)  04/06/19 250 lb 3.2 oz (113.5 kg)  03/29/19 249 lb 8 oz (113.2 kg)    Physical Exam  Constitutional:  Body mass index is 43.8 kg/m.,  not in acute distress,  normal state of mind Eyes: PERRLA, EOMI, no exophthalmos ENT: moist mucous membranes, no gross thyromegaly, no gross cervical lymphadenopathy Cardiovascular: normal precordial activity, Regular Rate and Rhythm, no Murmur/Rubs/Gallops Respiratory:  adequate breathing efforts, no gross chest deformity, Clear to auscultation bilaterally Gastrointestinal: abdomen soft, Non -tender, No distension, Bowel Sounds present, no gross organomegaly Musculoskeletal: no gross deformities, strength intact in all four extremities Skin: moist, warm, no rashes Neurological: no tremor with outstretched hands, Deep tendon reflexes normal in bilateral lower extremities.  CMP ( most recent) CMP     Component Value Date/Time   NA 143 04/06/2019 1602   K 5.6 (H) 04/06/2019 1602   CL 109 (H) 04/06/2019 1602   CO2 21 04/06/2019 1602   GLUCOSE 93 04/06/2019 1602   GLUCOSE 92 12/21/2018 1547   BUN 26 04/06/2019 1602   CREATININE 1.54 (H) 04/06/2019 1602   CREATININE 1.31 (H) 12/21/2018 1547   CALCIUM 9.9 04/06/2019 1602   PROT 7.0 04/06/2019 1602   ALBUMIN 3.7 04/06/2019 1602   AST 11 04/06/2019 1602   ALT 8 04/06/2019 1602   ALKPHOS 95 04/06/2019 1602   BILITOT <0.2 04/06/2019 1602   GFRNONAA 32 (L) 04/06/2019 1602   GFRAA 37 (L) 04/06/2019 1602     Diabetic Labs (most recent): Lab Results  Component Value Date   HGBA1C 6.0 (H) 12/09/2015   HGBA1C 6.0 (H) 08/23/2013     Lipid Panel ( most recent) Lipid Panel     Component Value Date/Time   CHOL 168 04/06/2019 1602   TRIG 72 04/06/2019 1602   HDL 53 04/06/2019 1602   CHOLHDL 3.2 04/06/2019 1602   CHOLHDL 2.6 11/20/2016 0854   VLDL 11 11/20/2016 0854   LDLCALC 101 (H) 04/06/2019 1602   LABVLDL  14 04/06/2019 1602      Lab Results  Component Value Date   TSH 0.347 (L) 04/06/2019   TSH 0.324 (L) 12/09/2015   TSH 0.292 (L) 10/19/2013   FREET4 1.04 12/11/2015   FREET4 1.09 10/19/2013      Assessment & Plan:   1. Subclinical  hyperthyroidism  - Shelly [name]  is being seen at a kind request of [name], [name], MD. - I have reviewed her available thyroid records and clinically evaluated the patient. - Based on these reviews, she has subclinical hyperthyroidism at least since 2015,  however,  there is not sufficient recent information to proceed with definitive treatment plan.  - she will need a repeat,  more complete thyroid function tests towards confirming the diagnosis.  She will be sent to lab today for: - TSH SOLSTAS - T4, free SOLSTAS - T3, Free - Thyroid peroxidase antibody SOLSTAS - Thyroglobulin antibody- SOLSTAS  If her  labs are suggestive of primary hyperthyroidism, she will be considered for thyroid uptake and scan to confirm the diagnosis. -she will return in 1 week to review her labs and treatment decisions if necessary.    - I did not initiate any new prescriptions today. - she is advised to maintain close follow up with [name], [name], MD for primary care needs.   - Time spent with the patient: 45 minutes, of which >50% was spent in  counseling her about her abnormal thyroid function test and the rest in obtaining information about her symptoms, reviewing her previous labs/studies ( including abstractions from other facilities),  evaluations, and treatments,  and developing a plan to confirm diagnosis and long term treatment based on the latest standards of care/guidelines; and documenting her care.  Shelly [name] participated in the discussions, expressed understanding, and voiced agreement with the above plans.  All questions were answered to her satisfaction. she is encouraged to contact clinic should she have any questions or concerns prior to her return visit.  Follow up plan: Return in about 1 week (around 06/06/2019), or phone, for Labs Today- Non-Fasting Ok.   Glade Lloyd, MD [facility] [address] [place], [place] [address] Phone: [phone]  Fax: [phone]     05/30/2019, 4:39 PM  This note was partially dictated with voice recognition software. Similar sounding words can be transcribed inadequately or may not  be corrected upon review.

## 2019-05-31 LAB — THYROGLOBULIN ANTIBODY: Thyroglobulin Ab: <1 IU/mL (ref <=1)

## 2019-05-31 LAB — T4, FREE: Free T4: 0.9 ng/dL (ref 0.8–1.8)

## 2019-05-31 LAB — TSH: TSH: 0.13 mIU/L — ABNORMAL LOW (ref 0.40–4.50)

## 2019-05-31 LAB — THYROID PEROXIDASE ANTIBODY: Thyroperoxidase Ab SerPl-aCnc: <1 IU/mL (ref <9)

## 2019-05-31 LAB — T3, FREE: T3, Free: 3.1 pg/mL (ref 2.3–4.2)

## 2019-06-01 ENCOUNTER — Telehealth: Payer: Self-pay | Admitting: Cardiovascular Disease

## 2019-06-01 ENCOUNTER — Ambulatory Visit: Payer: Medicare Other | Admitting: Neurology

## 2019-06-01 NOTE — Telephone Encounter (Signed)
Needs help with medication - has forgot what she is do to with one medication and did not know the name of it

## 2019-06-01 NOTE — Telephone Encounter (Signed)
Spoke with patient about her medications, thinks she may have lost one.  She request medication list - she will pick up at office this evening.  Advised her to compare bottles with list & let us know if needs something afterwards.  She verbalized understanding.

## 2019-06-02 ENCOUNTER — Other Ambulatory Visit: Payer: Self-pay

## 2019-06-02 ENCOUNTER — Ambulatory Visit: Payer: Medicare Other | Admitting: Gastroenterology

## 2019-06-02 ENCOUNTER — Encounter: Payer: Self-pay | Admitting: Gastroenterology

## 2019-06-02 VITALS — BP 158/73 | HR 77 | Temp 96.6°F | Ht 64.0 in | Wt 252.2 lb

## 2019-06-02 DIAGNOSIS — K219 Gastro-esophageal reflux disease without esophagitis: Secondary | ICD-10-CM | POA: Diagnosis not present

## 2019-06-02 DIAGNOSIS — D649 Anemia, unspecified: Secondary | ICD-10-CM | POA: Diagnosis not present

## 2019-06-02 NOTE — Patient Instructions (Signed)
Please have blood work completed when you are able. We will call with results!  Continue Protonix as needed.  It was good to see you again!  Happy Birthday!  I will see you in April!  I enjoyed seeing you again today! As you know, I value our relationship and want to provide genuine, compassionate, and quality care. I welcome your feedback. If you receive a survey regarding your visit,  I greatly appreciate you taking time to fill this out. See you next time!  Annitta Needs, PhD, ANP-BC [facility]

## 2019-06-02 NOTE — Progress Notes (Signed)
Primary Care Physician:  [name], [name], MD Primary GI: Dr. Oneida Alar   Chief Complaint  Patient presents with  . Gastroesophageal Reflux    doing ok    HPI:   Shelly [name] is a 79 y.o. female presenting today with a history of diverticular bleed several years ago, IDA with capsule study showing enteritis due to aspirin use, chronic GERD.Prefers frequent visits with health care team.Last seen in Oct 2020.   No abdominal pain,. Hungry  today. No dysphagia. Saw Dr. Dorris Fetch recently. Protonix prn. Dealing with postherpetic neuralgia.   Hgb in Nov 2020 was 10.6, drifting down from last year. Will recheck CBC and iron studies.     Past Medical History:  Diagnosis Date  . Allergy   . Anemia    GI bleed  . Anginal pain ([place])   . Anxiety   . Arthritis   . Blood transfusion without reported diagnosis   . Cataract   . Chronic kidney disease   . Coronary artery disease    SEVERE  . GERD (gastroesophageal reflux disease)   . GI bleed 12/2013  . Goiter   . Hyperlipidemia   . Hypertension   . Myocardial infarct ([place])   . Myocardial infarction ([place]) 08/2013  . Neuralgia of right lower extremity 07/13/2017  . Pancolonic diverticulosis 12/2013  . Post herpetic neuralgia     Past Surgical History:  Procedure Laterality Date  . COLONOSCOPY N/A 01/13/2014   Dr. Hung:pandiverticulosis   . COLONOSCOPY N/A 12/14/2015   Dr. Michail Sermon: pancolonic diverticulosis, internal hemorrhoids   . CORNEAL TRANSPLANT    . CORONARY ANGIOPLASTY  08/2013  . ESOPHAGOGASTRODUODENOSCOPY N/A 12/14/2015   Dr. Michail Sermon: normal   . GIVENS CAPSULE STUDY N/A 08/13/2016   Dr. Oneida Alar: enteritis due to aspirin.   Marland Kitchen HEEL SPUR EXCISION    . LEFT HEART CATHETERIZATION WITH CORONARY ANGIOGRAM N/A 08/23/2013   Procedure: LEFT HEART CATHETERIZATION WITH CORONARY ANGIOGRAM;  Surgeon: Peter M Martinique, MD;  Location: [facility] CATH LAB;  Service: Cardiovascular;  Laterality: N/A;  . PERCUTANEOUS CORONARY STENT INTERVENTION  (PCI-S) N/A 08/24/2013   Procedure: PERCUTANEOUS CORONARY STENT INTERVENTION (PCI-S);  Surgeon: Peter M Martinique, MD;  Location: [facility] CATH LAB;  Service: Cardiovascular;  Laterality: N/A;  . TONSILLECTOMY    . TUBAL LIGATION      Current Outpatient Medications  Medication Sig Dispense Refill  . b complex vitamins capsule Take 1 capsule by mouth daily.    . cetirizine (ZYRTEC) 10 MG tablet Take 10 mg by mouth daily.    . chlorthalidone (HYGROTON) 25 MG tablet Take 1 tablet (25 mg total) by mouth daily. 30 tablet 1  . clopidogrel (PLAVIX) 75 MG tablet TAKE ONE TABLET BY MOUTH DAILY 90 tablet 3  . clotrimazole-betamethasone (LOTRISONE) cream APPLY AS DIRECTED TWICE DAILY. 45 g 1  . diclofenac Sodium (VOLTAREN) 1 % GEL Apply 4 g topically 4 (four) times daily as needed. 100 g 5  . lisinopril (ZESTRIL) 40 MG tablet Take 1 tablet (40 mg total) by mouth daily. 90 tablet 3  . metoprolol succinate (TOPROL XL) 25 MG 24 hr tablet Take 1 tablet (25 mg total) by mouth daily. 90 tablet 3  . Multiple Vitamin (MULTIVITAMIN) tablet Take 1 tablet by mouth daily.    . pantoprazole (PROTONIX) 40 MG tablet Take 1 tablet (40 mg total) by mouth daily. 30 minutes before breakfast 90 tablet 3  . Pregabalin (LYRICA PO) Take by mouth at bedtime as needed. Patient not sure  of strength    . rosuvastatin (CRESTOR) 5 MG tablet Take 1 tablet (5 mg total) by mouth daily. 90 tablet 3  . chlorthalidone (HYGROTON) 25 MG tablet Take 1 tablet (25 mg total) by mouth daily. 90 tablet 3   No current facility-administered medications for this visit.    Allergies as of 06/02/2019 - Review Complete 06/02/2019  Allergen Reaction Noted  . Nsaids Other (See Comments) 07/25/2016  . Atorvastatin Other (See Comments) 11/30/2018    Family History  Problem Relation Age of Onset  . Hypertension Brother   . Transient ischemic attack Brother   . Cancer Mother        unsure of type   . Other Father        grangrene   . Asthma Sister   .  Stroke Sister   . COPD Sister   . Stroke Sister   . Early death Brother   . Early death Brother   . Liver cancer Daughter   . Cancer Daughter   . Hernia Son        Umbilical  . GI problems Son   . Pancreatic disease Daughter        pancreatectomy  . Cancer Daughter   . Diverticulitis Daughter   . Arthritis Daughter        knee   . Arthritis Daughter        shoulder   . Anemia Daughter   . GER disease Son     Social History   Socioeconomic History  . Marital status: Widowed    Spouse name: Not on file  . Number of children: 8  . Years of education: 47  . Highest education level: Not on file  Occupational History  . 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 Smoking status: Never Smoker  . Smokeless tobacco: Never Used  Substance and Sexual Activity  . Alcohol use: No    Alcohol/week: 0.0 standard drinks  . Drug use: No  . Sexual activity: Not Currently    Comment: divorced, lives with daughter and granddaughters  Other Topics Concern  . Not on file  Social History Narrative   Works in a group home   Lives with daughter   Two grand daughters    One son home most of the time   Right-handed.   No daily caffeine use.   Social Determinants of Health   Financial Resource Strain:   . Difficulty of Paying Living Expenses: Not on file  Food Insecurity:   . Worried About Charity fundraiser in the Last Year: Not on file  . Ran Out of Food in the Last Year: Not on file  Transportation Needs:   . Lack of Transportation (Medical): Not on file  . Lack of Transportation (Non-Medical): Not on file  Physical Activity:   . Days of Exercise per Week: Not on file  . Minutes of Exercise per Session: Not on file  Stress:   . Feeling of Stress : Not on file  Social Connections:   . Frequency of Communication with Friends and Family: Not on file  . Frequency of Social Gatherings with Friends and Family: Not on file  . Attends Religious Services: Not  on file  . Active Member of Clubs or Organizations: Not on file  . Attends Archivist Meetings: Not on file  . Marital Status: Not on file    Review of Systems: Gen: Denies fever, chills, anorexia.  Denies fatigue, weakness, weight loss.  CV: Denies chest pain, palpitations, syncope, peripheral edema, and claudication. Resp: Denies dyspnea at rest, cough, wheezing, coughing up blood, and pleurisy. GI: see HPI  Derm: Denies rash, itching, dry skin Psych: Denies depression, anxiety, memory loss, confusion. No homicidal or suicidal ideation.  Heme: Denies bruising, bleeding, and enlarged lymph nodes.  Physical Exam: BP (!) 158/73   Pulse 77   Temp (!) 96.6 F (35.9 C) (Temporal)   Ht 5\' 4"  (1.626 m)   Wt 252 lb 3.2 oz (114.4 kg)   BMI 43.29 kg/m  General:   Alert and oriented. No distress noted. Pleasant and cooperative.  Head:  Normocephalic and atraumatic. Eyes:  Conjuctiva clear without scleral icterus. Abdomen:  +BS, soft, non-tender and non-distended. No rebound or guarding. No HSM or masses noted. Msk:  Symmetrical without gross deformities. Normal posture. Extremities:  Without edema. Neurologic:  Alert and  oriented x4 Psych:  Alert and cooperative. Normal mood and affect.  ASSESSMENT: Shelly [name] is a 79 y.o. female presenting today with history of IDA, chronic GERD well managed at this time, with Hgb now drifting recently. Will update iron studies at this time. Known enteritis due to aspirin use, seen on capsule study.    PLAN:  Continue Protonix daily  Check iron studies  Return in 3 months: patient requests frequent visits although she is doing quite well   Annitta Needs, PhD, ANP-BC [facility]

## 2019-06-06 ENCOUNTER — Ambulatory Visit (INDEPENDENT_AMBULATORY_CARE_PROVIDER_SITE_OTHER): Payer: Medicare Other | Admitting: "Endocrinology

## 2019-06-06 ENCOUNTER — Encounter: Payer: Self-pay | Admitting: "Endocrinology

## 2019-06-06 DIAGNOSIS — E0590 Thyrotoxicosis, unspecified without thyrotoxic crisis or storm: Secondary | ICD-10-CM | POA: Diagnosis not present

## 2019-06-06 NOTE — Progress Notes (Signed)
06/06/2019, 11:32 AM                                Endocrinology Telehealth Visit Follow up Note -During COVID -19 Pandemic  I connected with Shelly [name] on 06/06/2019   by telephone and verified that I am speaking with the correct person using two identifiers. Shelly [name], 18-Jun-1940. she has verbally consented to this visit. All issues noted in this document were discussed and addressed. The format was not optimal for physical exam.   Subjective:    Patient ID: Shelly [name], female    DOB: [birth year], PCP [name], [name], MD   Past Medical History:  Diagnosis Date  . Allergy   . Anemia    GI bleed  . Anginal pain ([place])   . Anxiety   . Arthritis   . Blood transfusion without reported diagnosis   . Cataract   . Chronic kidney disease   . Coronary artery disease    SEVERE  . GERD (gastroesophageal reflux disease)   . GI bleed 12/2013  . Goiter   . Hyperlipidemia   . Hypertension   . Myocardial infarct ([place])   . Myocardial infarction ([place]) 08/2013  . Neuralgia of right lower extremity 07/13/2017  . Pancolonic diverticulosis 12/2013  . Post herpetic neuralgia    Past Surgical History:  Procedure Laterality Date  . COLONOSCOPY N/A 01/13/2014   Dr. Hung:pandiverticulosis   . COLONOSCOPY N/A 12/14/2015   Dr. Michail Sermon: pancolonic diverticulosis, internal hemorrhoids   . CORNEAL TRANSPLANT    . CORONARY ANGIOPLASTY  08/2013  . ESOPHAGOGASTRODUODENOSCOPY N/A 12/14/2015   Dr. Michail Sermon: normal   . GIVENS CAPSULE STUDY N/A 08/13/2016   Dr. Oneida Alar: enteritis due to aspirin.   Marland Kitchen HEEL SPUR EXCISION    . LEFT HEART CATHETERIZATION WITH CORONARY ANGIOGRAM N/A 08/23/2013   Procedure: LEFT HEART CATHETERIZATION WITH CORONARY ANGIOGRAM;  Surgeon: Peter M Martinique, MD;  Location: [facility] CATH LAB;  Service: Cardiovascular;  Laterality: N/A;  . PERCUTANEOUS CORONARY STENT INTERVENTION (PCI-S) N/A 08/24/2013   Procedure: PERCUTANEOUS  CORONARY STENT INTERVENTION (PCI-S);  Surgeon: Peter M Martinique, MD;  Location: [facility] CATH LAB;  Service: Cardiovascular;  Laterality: N/A;  . TONSILLECTOMY    . TUBAL LIGATION     Social History   Socioeconomic History  . Marital status: Widowed    Spouse name: Not on file  . Number of children: 8  . Years of education: 75  . Highest education level: Not on file  Occupational History  . 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 Smoking status: Never Smoker  . Smokeless tobacco: Never Used  Substance and Sexual Activity  . Alcohol use: No    Alcohol/week: 0.0 standard drinks  . Drug use: No  . Sexual activity: Not Currently    Comment: divorced, lives with daughter and granddaughters  Other Topics Concern  . Not on file  Social History Narrative   Works in a group home   Lives with daughter   Two grand daughters    One son home most of the time   Right-handed.   No daily caffeine use.  Social Determinants of Health   Financial Resource Strain:   . Difficulty of Paying Living Expenses: Not on file  Food Insecurity:   . Worried About Charity fundraiser in the Last Year: Not on file  . Ran Out of Food in the Last Year: Not on file  Transportation Needs:   . Lack of Transportation (Medical): Not on file  . Lack of Transportation (Non-Medical): Not on file  Physical Activity:   . Days of Exercise per Week: Not on file  . Minutes of Exercise per Session: Not on file  Stress:   . Feeling of Stress : Not on file  Social Connections:   . Frequency of Communication with Friends and Family: Not on file  . Frequency of Social Gatherings with Friends and Family: Not on file  . Attends Religious Services: Not on file  . Active Member of Clubs or Organizations: Not on file  . Attends Archivist Meetings: Not on file  . Marital Status: Not on file   Family History  Problem Relation Age of Onset  . Hypertension Brother   . Transient ischemic  attack Brother   . Cancer Mother        unsure of type   . Other Father        grangrene   . Asthma Sister   . Stroke Sister   . COPD Sister   . Stroke Sister   . Early death Brother   . Early death Brother   . Liver cancer Daughter   . Cancer Daughter   . Hernia Son        Umbilical  . GI problems Son   . Pancreatic disease Daughter        pancreatectomy  . Cancer Daughter   . Diverticulitis Daughter   . Arthritis Daughter        knee   . Arthritis Daughter        shoulder   . Anemia Daughter   . GER disease Son    Outpatient Encounter Medications as of 06/06/2019  Medication Sig  . b complex vitamins capsule Take 1 capsule by mouth daily.  . cetirizine (ZYRTEC) 10 MG tablet Take 10 mg by mouth daily.  . chlorthalidone (HYGROTON) 25 MG tablet Take 1 tablet (25 mg total) by mouth daily.  . chlorthalidone (HYGROTON) 25 MG tablet Take 1 tablet (25 mg total) by mouth daily.  . clopidogrel (PLAVIX) 75 MG tablet TAKE ONE TABLET BY MOUTH DAILY  . clotrimazole-betamethasone (LOTRISONE) cream APPLY AS DIRECTED TWICE DAILY.  Marland Kitchen diclofenac Sodium (VOLTAREN) 1 % GEL Apply 4 g topically 4 (four) times daily as needed.  Marland Kitchen lisinopril (ZESTRIL) 40 MG tablet Take 1 tablet (40 mg total) by mouth daily.  . metoprolol succinate (TOPROL XL) 25 MG 24 hr tablet Take 1 tablet (25 mg total) by mouth daily.  . Multiple Vitamin (MULTIVITAMIN) tablet Take 1 tablet by mouth daily.  . pantoprazole (PROTONIX) 40 MG tablet Take 1 tablet (40 mg total) by mouth daily. 30 minutes before breakfast  . Pregabalin (LYRICA PO) Take by mouth at bedtime as needed. Patient not sure of strength  . rosuvastatin (CRESTOR) 5 MG tablet Take 1 tablet (5 mg total) by mouth daily.   No facility-administered encounter medications on file as of 06/06/2019.   ALLERGIES: Allergies  Allergen Reactions  . Nsaids Other (See Comments)    AVOID all NSAID due to history of GI bleeding  . Atorvastatin Other (See Comments)  Joint & muscle pain    VACCINATION STATUS: Immunization History  Administered Date(s) Administered  . Pneumococcal Conjugate-13 10/02/2016  . Pneumococcal Polysaccharide-23 08/25/2013  . Zoster Recombinat (Shingrix) 03/17/2018    HPI PARRIE CRANGLE is 79 y.o. female who is being engaged in telehealth via telephone in follow-up after she was seen in consultation for  abnormal thyroid function test requested by [name], [name], MD.  History is obtained directly from the patient.  She denies any prior history of thyroid dysfunction.  Review of her medical records show suppressed TSH at least from 2015.  Most recent labs are from November 2020 showing suppressed TSH of 0.34, no free T4 or total T4 was measured simultaneously.   Her most recent, more complete thyroid function test confirms subclinical hyperthyroidism.  Patient still denies any recent weight change, palpitations, heat intolerance, tremors.  She denies any history of goiter, no family history of thyroid dysfunction or thyroid malignancy. She is not currently on antithyroid treatment nor thyroid hormone supplements.  Her medical history includes hypertension and hyperlipidemia on treatment.  Review of Systems Limited as above.  Objective:    There were no vitals taken for this visit.  Wt Readings from Last 3 Encounters:  06/02/19 252 lb 3.2 oz (114.4 kg)  05/30/19 255 lb 3.2 oz (115.8 kg)  04/06/19 250 lb 3.2 oz (113.5 kg)    Physical Exam   CMP     Component Value Date/Time   NA 143 04/06/2019 1602   K 5.6 (H) 04/06/2019 1602   CL 109 (H) 04/06/2019 1602   CO2 21 04/06/2019 1602   GLUCOSE 93 04/06/2019 1602   GLUCOSE 92 12/21/2018 1547   BUN 26 04/06/2019 1602   CREATININE 1.54 (H) 04/06/2019 1602   CREATININE 1.31 (H) 12/21/2018 1547   CALCIUM 9.9 04/06/2019 1602   PROT 7.0 04/06/2019 1602   ALBUMIN 3.7 04/06/2019 1602   AST 11 04/06/2019 1602   ALT 8 04/06/2019 1602   ALKPHOS 95 04/06/2019 1602    BILITOT <0.2 04/06/2019 1602   GFRNONAA 32 (L) 04/06/2019 1602   GFRAA 37 (L) 04/06/2019 1602     Diabetic Labs (most recent): Lab Results  Component Value Date   HGBA1C 6.0 (H) 12/09/2015   HGBA1C 6.0 (H) 08/23/2013     Lipid Panel ( most recent) Lipid Panel     Component Value Date/Time   CHOL 168 04/06/2019 1602   TRIG 72 04/06/2019 1602   HDL 53 04/06/2019 1602   CHOLHDL 3.2 04/06/2019 1602   CHOLHDL 2.6 11/20/2016 0854   VLDL 11 11/20/2016 0854   LDLCALC 101 (H) 04/06/2019 1602   LABVLDL 14 04/06/2019 1602      Lab Results  Component Value Date   TSH 0.13 (L) 05/30/2019   TSH 0.347 (L) 04/06/2019   TSH 0.324 (L) 12/09/2015   TSH 0.292 (L) 10/19/2013   FREET4 0.9 05/30/2019   FREET4 1.04 12/11/2015   FREET4 1.09 10/19/2013      Assessment & Plan:   1. Subclinical hyperthyroidism  -Her repeat, more complete thyroid function tests still confirm subclinical hyperthyroidism.  She will need thyroid uptake and scan to characterize the status of her thyroid function. I discussed this test with her and will be ordered to be done as soon as possible in [facility].  -she will return in 15 days to discuss her results and decisions for treatment if necessary.   -If she has significant uptake, will be considered for I-131 thyroid  ablation.  - I did not initiate any new prescriptions today.   - she is advised to maintain close follow up with [name], [name], MD for primary care needs.     - Time spent on this patient care encounter:  20 minutes of which 50% was spent in  counseling and the rest reviewing  her current and  previous labs / studies and medications  doses and developing a plan for long term care. Shelly [name]  participated in the discussions, expressed understanding, and voiced agreement with the above plans.  All questions were answered to her satisfaction. she is encouraged to contact clinic should she have any questions or concerns  prior to her return visit.   Follow up plan: Return in about 2 weeks (around 06/20/2019) for Follow up with Thyroid Uptake and Scan.   Glade Lloyd, MD [facility] [address] [place], [place] [address] Phone: [phone]  Fax: [phone]     06/06/2019, 11:32 AM  This note was partially dictated with voice recognition software. Similar sounding words can be transcribed inadequately or may not  be corrected upon review.

## 2019-06-06 NOTE — Progress Notes (Signed)
Cc'ed to pcp °

## 2019-06-08 DIAGNOSIS — D649 Anemia, unspecified: Secondary | ICD-10-CM | POA: Diagnosis not present

## 2019-06-09 LAB — CBC WITH DIFFERENTIAL/PLATELET
Absolute Monocytes: 682 cells/uL (ref 200–950)
Basophils Absolute: 37 cells/uL (ref 0–200)
Basophils Relative: 0.6 %
Eosinophils Absolute: 99 cells/uL (ref 15–500)
Eosinophils Relative: 1.6 %
HCT: 31.5 % — ABNORMAL LOW (ref 35.0–45.0)
Hemoglobin: 10.2 g/dL — ABNORMAL LOW (ref 11.7–15.5)
Lymphs Abs: 2251 cells/uL (ref 850–3900)
MCH: 30.3 pg (ref 27.0–33.0)
MCHC: 32.4 g/dL (ref 32.0–36.0)
MCV: 93.5 fL (ref 80.0–100.0)
MPV: 11.2 fL (ref 7.5–12.5)
Monocytes Relative: 11 %
Neutro Abs: 3131 cells/uL (ref 1500–7800)
Neutrophils Relative %: 50.5 %
Platelets: 242 10*3/uL (ref 140–400)
RBC: 3.37 10*6/uL — ABNORMAL LOW (ref 3.80–5.10)
RDW: 13.1 % (ref 11.0–15.0)
Total Lymphocyte: 36.3 %
WBC: 6.2 10*3/uL (ref 3.8–10.8)

## 2019-06-09 LAB — IRON,TIBC AND FERRITIN PANEL
%SAT: 25 % (calc) (ref 16–45)
Ferritin: 120 ng/mL (ref 16–288)
Iron: 56 ug/dL (ref 45–160)
TIBC: 227 mcg/dL (calc) — ABNORMAL LOW (ref 250–450)

## 2019-06-10 ENCOUNTER — Other Ambulatory Visit: Payer: Self-pay

## 2019-06-10 DIAGNOSIS — D649 Anemia, unspecified: Secondary | ICD-10-CM

## 2019-06-13 ENCOUNTER — Other Ambulatory Visit: Payer: Self-pay

## 2019-06-13 ENCOUNTER — Encounter (HOSPITAL_COMMUNITY): Payer: Self-pay

## 2019-06-13 ENCOUNTER — Encounter (HOSPITAL_COMMUNITY)
Admission: RE | Admit: 2019-06-13 | Discharge: 2019-06-13 | Disposition: A | Discharge status: Home / Self Care | Payer: Medicare Other | Source: Ambulatory Visit | Attending: "Endocrinology | Admitting: "Endocrinology

## 2019-06-13 DIAGNOSIS — E0590 Thyrotoxicosis, unspecified without thyrotoxic crisis or storm: Secondary | ICD-10-CM | POA: Diagnosis not present

## 2019-06-13 MED ORDER — SODIUM IODIDE I-123 7.4 MBQ CAPS
317.0000 | ORAL_CAPSULE | Freq: Once | ORAL | Status: AC
  Administered 2019-06-13: 317 via ORAL

## 2019-06-14 ENCOUNTER — Encounter (HOSPITAL_COMMUNITY)
Admission: RE | Admit: 2019-06-14 | Discharge: 2019-06-14 | Disposition: A | Discharge status: Home / Self Care | Payer: Medicare Other | Source: Ambulatory Visit | Attending: "Endocrinology | Admitting: "Endocrinology

## 2019-06-14 ENCOUNTER — Ambulatory Visit: Payer: Medicare Other | Admitting: *Deleted

## 2019-06-14 DIAGNOSIS — I10 Essential (primary) hypertension: Secondary | ICD-10-CM

## 2019-06-14 DIAGNOSIS — R221 Localized swelling, mass and lump, neck: Secondary | ICD-10-CM | POA: Diagnosis not present

## 2019-06-14 DIAGNOSIS — E0590 Thyrotoxicosis, unspecified without thyrotoxic crisis or storm: Secondary | ICD-10-CM | POA: Diagnosis not present

## 2019-06-14 DIAGNOSIS — N1832 Chronic kidney disease, stage 3b: Secondary | ICD-10-CM

## 2019-06-14 NOTE — Chronic Care Management (AMB) (Signed)
  Chronic Care Management   Outreach Note  06/14/2019 Name: Shelly [name] MRN: [id number] DOB: 04/10/1941  Referred by: [name], [name], MD Reason for referral : Chronic Care Management   A 2nd unsuccessful Initial Telephone outreach was attempted today. The patient was referred to the case management team by for assistance with care management and care coordination.   Follow Up Plan: A HIPPA compliant phone message was left for the patient providing contact information and requesting a return call.  The care management team will reach out to the patient again over the next 30 days.   [name], BSN, RN-BC Embedded Chronic Care Manager [facility] [place] [facility] Direct Dial: [phone]

## 2019-06-15 ENCOUNTER — Ambulatory Visit (INDEPENDENT_AMBULATORY_CARE_PROVIDER_SITE_OTHER): Payer: Medicare Other | Admitting: Family Medicine

## 2019-06-15 ENCOUNTER — Other Ambulatory Visit: Payer: Self-pay

## 2019-06-15 ENCOUNTER — Encounter: Payer: Self-pay | Admitting: Family Medicine

## 2019-06-15 VITALS — BP 115/62 | HR 98 | Temp 97.1°F | Ht 64.0 in | Wt 256.4 lb

## 2019-06-15 DIAGNOSIS — R400 Somnolence: Secondary | ICD-10-CM

## 2019-06-15 DIAGNOSIS — R609 Edema, unspecified: Secondary | ICD-10-CM | POA: Diagnosis not present

## 2019-06-15 DIAGNOSIS — B0229 Other postherpetic nervous system involvement: Secondary | ICD-10-CM | POA: Diagnosis not present

## 2019-06-15 NOTE — Progress Notes (Signed)
Assessment & Plan:  1. Dependent edema - Education provided on edema.  Discussed high salt concentration in canned foods, frozen dinners, processed meats, etc.  Encourage patient to decrease salt content in her diet.  She will continue chlorthalidone 25 mg once daily and compression socks.  Encouraged her to elevate her feet to the level of her heart is much as possible when she is sitting.  Also discussed sleep apnea can contribute to her edema as well.  We will assist in getting her sleep study rescheduled.  2. Postherpetic neuralgia - Continue current plan as patient is unable to tolerate increased frequency of Lyrica due to excessive daytime sleepiness.  Discussed we need to get her in for her sleep study so that we can help correct this.  We will assist in getting this rescheduled.  3. Daytime somnolence - Patient was previously referred for a sleep study by her PCP which has not yet been completed.  We will assist in getting this rescheduled.   Follow up plan: Return as scheduled with PCP next month.  Hendricks Limes, MSN, APRN, FNP-C [facility]  Subjective:   Patient ID: Shelly [name], female    DOB: Apr 23, 1941, 79 y.o.   MRN: [id number]  HPI: Shelly [name] is a 79 y.o. female presenting on 06/15/2019 for Leg Swelling (bilateral leg swelling. Been going on for a week. Patient has been using comprassion socks. )  Patient comes in today with complaints of bilateral lower extremity edema that she reports has been ongoing for the past week.  Upon chart review it appears this has been going on longer than that because she was seen by Dr. Livia Snellen on 05/23/2019 with the same complaint.  Patient states that she wears knee-high compression socks, although she does not have them on today.  Patient reports she does not elevate her feet when she is sitting.  Reports she is taking the chlorthalidone 25 mg once daily.  She does not add salt to any of her food but does not  necessarily buy low sodium.   Patient also has concerns regarding nerve pain at her right shoulder from a previous outbreak of shingles over a year ago.  Describes this as a feeling of briars under the skin.  She reports she saw a neurologist but does not desire to return.  She reports she is taking Lyrica once daily.  States she was previously taking it twice daily but because it makes her so sleepy she was advised to only take it once daily.  Patient states medications are very hard for her because she falls asleep so easily during the day.  It appears she was previously referred for a sleep study but this has not been completed.  She states it was canceled.  She is agreeable to rescheduling this.  She states she tried to go down to [facility] and find the sleep center to reschedule this herself but was not successful.   ROS: Negative unless specifically indicated above in HPI.   Relevant past medical history reviewed and updated as indicated.   Allergies and medications reviewed and updated.   Current Outpatient Medications:  .  b complex vitamins capsule, Take 1 capsule by mouth daily., Disp: , Rfl:  .  cetirizine (ZYRTEC) 10 MG tablet, Take 10 mg by mouth daily., Disp: , Rfl:  .  chlorthalidone (HYGROTON) 25 MG tablet, Take 1 tablet (25 mg total) by mouth daily., Disp: 30 tablet, Rfl: 1 .  clopidogrel (  PLAVIX) 75 MG tablet, TAKE ONE TABLET BY MOUTH DAILY, Disp: 90 tablet, Rfl: 3 .  clotrimazole-betamethasone (LOTRISONE) cream, APPLY AS DIRECTED TWICE DAILY., Disp: 45 g, Rfl: 1 .  diclofenac Sodium (VOLTAREN) 1 % GEL, Apply 4 g topically 4 (four) times daily as needed., Disp: 100 g, Rfl: 5 .  lisinopril (ZESTRIL) 40 MG tablet, Take 1 tablet (40 mg total) by mouth daily., Disp: 90 tablet, Rfl: 3 .  metoprolol succinate (TOPROL XL) 25 MG 24 hr tablet, Take 1 tablet (25 mg total) by mouth daily., Disp: 90 tablet, Rfl: 3 .  pantoprazole (PROTONIX) 40 MG tablet, Take 1 tablet (40 mg total) by  mouth daily. 30 minutes before breakfast, Disp: 90 tablet, Rfl: 3 .  Pregabalin (LYRICA PO), Take by mouth at bedtime as needed. Patient not sure of strength, Disp: , Rfl:  .  Multiple Vitamin (MULTIVITAMIN) tablet, Take 1 tablet by mouth daily., Disp: , Rfl:  .  rosuvastatin (CRESTOR) 5 MG tablet, Take 1 tablet (5 mg total) by mouth daily., Disp: 90 tablet, Rfl: 3  Allergies  Allergen Reactions  . Nsaids Other (See Comments)    AVOID all NSAID due to history of GI bleeding  . Atorvastatin Other (See Comments)    Joint & muscle pain    Objective:   BP 115/62   Pulse 98   Temp (!) 97.1 F (36.2 C) (Temporal)   Ht 5\' 4"  (1.626 m)   Wt 256 lb 6.4 oz (116.3 kg)   SpO2 97%   BMI 44.01 kg/m    Physical Exam Vitals reviewed.  Constitutional:      General: She is not in acute distress.    Appearance: Normal appearance. She is not ill-appearing, toxic-appearing or diaphoretic.  HENT:     Head: Normocephalic and atraumatic.  Eyes:     General: No scleral icterus.       Right eye: No discharge.        Left eye: No discharge.     Conjunctiva/sclera: Conjunctivae normal.  Cardiovascular:     Rate and Rhythm: Normal rate and regular rhythm.     Heart sounds: Normal heart sounds. No murmur. No friction rub. No gallop.   Pulmonary:     Effort: Pulmonary effort is normal. No respiratory distress.     Breath sounds: Normal breath sounds. No stridor. No wheezing, rhonchi or rales.  Musculoskeletal:        General: Normal range of motion.     Cervical back: Normal range of motion.     Right lower leg: 2+ Pitting Edema present.     Left lower leg: 2+ Pitting Edema present.  Skin:    General: Skin is warm and dry.     Capillary Refill: Capillary refill takes less than 2 seconds.  Neurological:     General: No focal deficit present.     Mental Status: She is alert and oriented to person, place, and time. Mental status is at baseline.  Psychiatric:        Mood and Affect: Mood normal.         Behavior: Behavior normal.        Thought Content: Thought content normal.        Judgment: Judgment normal.

## 2019-06-15 NOTE — Patient Instructions (Signed)
Edema  Edema is when you have too much fluid in your body or under your skin. Edema may make your legs, feet, and ankles swell up. Swelling is also common in looser tissues, like around your eyes. This is a common condition. It gets more common as you get older. There are many possible causes of edema. Eating too much salt (sodium) and being on your feet or sitting for a long time can cause edema in your legs, feet, and ankles. Hot weather may make edema worse. Edema is usually painless. Your skin may look swollen or shiny. Follow these instructions at home:  Keep the swollen body part raised (elevated) above the level of your heart when you are sitting or lying down.  Do not sit still or stand for a long time.  Do not wear tight clothes. Do not wear garters on your upper legs.  Exercise your legs. This can help the swelling go down.  Wear elastic bandages or support stockings as told by your doctor.  Eat a low-salt (low-sodium) diet to reduce fluid as told by your doctor.  Depending on the cause of your swelling, you may need to limit how much fluid you drink (fluid restriction).  Take over-the-counter and prescription medicines only as told by your doctor. Contact a doctor if:  Treatment is not working.  You have heart, liver, or kidney disease and have symptoms of edema.  You have sudden and unexplained weight gain. Get help right away if:  You have shortness of breath or chest pain.  You cannot breathe when you lie down.  You have pain, redness, or warmth in the swollen areas.  You have heart, liver, or kidney disease and get edema all of a sudden.  You have a fever and your symptoms get worse all of a sudden. Summary  Edema is when you have too much fluid in your body or under your skin.  Edema may make your legs, feet, and ankles swell up. Swelling is also common in looser tissues, like around your eyes.  Raise (elevate) the swollen body part above the level of your  heart when you are sitting or lying down.  Follow your doctor's instructions about diet and how much fluid you can drink (fluid restriction). This information is not intended to replace advice given to you by your health care provider. Make sure you discuss any questions you have with your health care provider. Document Revised: 05/08/2017 Document Reviewed: 05/23/2016 Elsevier Patient Education  2020 [employer]  

## 2019-06-17 ENCOUNTER — Encounter: Payer: Self-pay | Admitting: Family Medicine

## 2019-06-23 ENCOUNTER — Ambulatory Visit (INDEPENDENT_AMBULATORY_CARE_PROVIDER_SITE_OTHER): Payer: Medicare Other | Admitting: "Endocrinology

## 2019-06-23 ENCOUNTER — Encounter: Payer: Self-pay | Admitting: "Endocrinology

## 2019-06-23 DIAGNOSIS — E0590 Thyrotoxicosis, unspecified without thyrotoxic crisis or storm: Secondary | ICD-10-CM

## 2019-06-23 MED ORDER — METHIMAZOLE 5 MG PO TABS: 5.0000 mg | ORAL_TABLET | Freq: Every day | ORAL | 3 refills | Status: DC

## 2019-06-23 NOTE — Progress Notes (Signed)
06/23/2019, 12:57 PM                                                      Endocrinology Telehealth Visit Follow up Note -During COVID -19 Pandemic  I connected with Shelly [name] on 06/23/2019   by telephone and verified that I am speaking with the correct person using two identifiers. Shelly [name], Mar 24, 1941. she has verbally consented to this visit. All issues noted in this document were discussed and addressed. The format was not optimal for physical exam.   Subjective:    Patient ID: Shelly [name], female    DOB: [birth year], PCP [name], [name], MD   Past Medical History:  Diagnosis Date  . Allergy   . Anemia    GI bleed  . Anginal pain ([place])   . Anxiety   . Arthritis   . Blood transfusion without reported diagnosis   . Cataract   . Chronic kidney disease   . Coronary artery disease    SEVERE  . GERD (gastroesophageal reflux disease)   . GI bleed 12/2013  . Goiter   . Hyperlipidemia   . Hypertension   . Myocardial infarct ([place])   . Myocardial infarction ([place]) 08/2013  . Neuralgia of right lower extremity 07/13/2017  . Pancolonic diverticulosis 12/2013  . Post herpetic neuralgia    Past Surgical History:  Procedure Laterality Date  . COLONOSCOPY N/A 01/13/2014   Dr. Hung:pandiverticulosis   . COLONOSCOPY N/A 12/14/2015   Dr. Michail Sermon: pancolonic diverticulosis, internal hemorrhoids   . CORNEAL TRANSPLANT    . CORONARY ANGIOPLASTY  08/2013  . ESOPHAGOGASTRODUODENOSCOPY N/A 12/14/2015   Dr. Michail Sermon: normal   . GIVENS CAPSULE STUDY N/A 08/13/2016   Dr. Oneida Alar: enteritis due to aspirin.   Marland Kitchen HEEL SPUR EXCISION    . LEFT HEART CATHETERIZATION WITH CORONARY ANGIOGRAM N/A 08/23/2013   Procedure: LEFT HEART CATHETERIZATION WITH CORONARY ANGIOGRAM;  Surgeon: Peter M Martinique, MD;  Location: [facility] CATH LAB;  Service: Cardiovascular;  Laterality: N/A;  . PERCUTANEOUS CORONARY STENT INTERVENTION (PCI-S) N/A 08/24/2013    Procedure: PERCUTANEOUS CORONARY STENT INTERVENTION (PCI-S);  Surgeon: Peter M Martinique, MD;  Location: [facility] CATH LAB;  Service: Cardiovascular;  Laterality: N/A;  . TONSILLECTOMY    . TUBAL LIGATION     Social History   Socioeconomic History  . Marital status: Widowed    Spouse name: Not on file  . Number of children: 8  . Years of education: 68  . Highest education level: Not on file  Occupational History  . 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 Smoking status: Never Smoker  . Smokeless tobacco: Never Used  Substance and Sexual Activity  . Alcohol use: No    Alcohol/week: 0.0 standard drinks  . Drug use: No  . Sexual activity: Not Currently    Comment: divorced, lives with daughter and granddaughters  Other Topics Concern  . Not on file  Social History Narrative   Works in a group home   Lives with daughter  Two grand daughters    One son home most of the time   Right-handed.   No daily caffeine use.   Social Determinants of Health   Financial Resource Strain:   . Difficulty of Paying Living Expenses: Not on file  Food Insecurity:   . Worried About Charity fundraiser in the Last Year: Not on file  . Ran Out of Food in the Last Year: Not on file  Transportation Needs:   . Lack of Transportation (Medical): Not on file  . Lack of Transportation (Non-Medical): Not on file  Physical Activity:   . Days of Exercise per Week: Not on file  . Minutes of Exercise per Session: Not on file  Stress:   . Feeling of Stress : Not on file  Social Connections:   . Frequency of Communication with Friends and Family: Not on file  . Frequency of Social Gatherings with Friends and Family: Not on file  . Attends Religious Services: Not on file  . Active Member of Clubs or Organizations: Not on file  . Attends Archivist Meetings: Not on file  . Marital Status: Not on file   Family History  Problem Relation Age of Onset  . Hypertension Brother    . Transient ischemic attack Brother   . Cancer Mother        unsure of type   . Other Father        grangrene   . Asthma Sister   . Stroke Sister   . COPD Sister   . Stroke Sister   . Early death Brother   . Early death Brother   . Liver cancer Daughter   . Cancer Daughter   . Hernia Son        Umbilical  . GI problems Son   . Pancreatic disease Daughter        pancreatectomy  . Cancer Daughter   . Diverticulitis Daughter   . Arthritis Daughter        knee   . Arthritis Daughter        shoulder   . Anemia Daughter   . GER disease Son    Outpatient Encounter Medications as of 06/23/2019  Medication Sig  . b complex vitamins capsule Take 1 capsule by mouth daily.  . cetirizine (ZYRTEC) 10 MG tablet Take 10 mg by mouth daily.  . chlorthalidone (HYGROTON) 25 MG tablet Take 1 tablet (25 mg total) by mouth daily.  . clopidogrel (PLAVIX) 75 MG tablet TAKE ONE TABLET BY MOUTH DAILY  . clotrimazole-betamethasone (LOTRISONE) cream APPLY AS DIRECTED TWICE DAILY.  Marland Kitchen diclofenac Sodium (VOLTAREN) 1 % GEL Apply 4 g topically 4 (four) times daily as needed.  Marland Kitchen lisinopril (ZESTRIL) 40 MG tablet Take 1 tablet (40 mg total) by mouth daily.  . methimazole (TAPAZOLE) 5 MG tablet Take 1 tablet (5 mg total) by mouth daily.  . metoprolol succinate (TOPROL XL) 25 MG 24 hr tablet Take 1 tablet (25 mg total) by mouth daily.  . Multiple Vitamin (MULTIVITAMIN) tablet Take 1 tablet by mouth daily.  . pantoprazole (PROTONIX) 40 MG tablet Take 1 tablet (40 mg total) by mouth daily. 30 minutes before breakfast  . Pregabalin (LYRICA PO) Take by mouth at bedtime as needed. Patient not sure of strength  . rosuvastatin (CRESTOR) 5 MG tablet Take 1 tablet (5 mg total) by mouth daily.   No facility-administered encounter medications on file as of 06/23/2019.   ALLERGIES: Allergies  Allergen Reactions  .  Nsaids Other (See Comments)    AVOID all NSAID due to history of GI bleeding  . Atorvastatin Other (See  Comments)    Joint & muscle pain    VACCINATION STATUS: Immunization History  Administered Date(s) Administered  . Pneumococcal Conjugate-13 10/02/2016  . Pneumococcal Polysaccharide-23 08/25/2013  . Zoster Recombinat (Shingrix) 03/17/2018    HPI TRINITY KILLELEA is 79 y.o. female who is being engaged in telehealth via telephone in follow-up after she was seen in consultation for  abnormal thyroid function test requested by [name], [name], MD.  History is obtained directly from the patient.  She denies any prior history of thyroid dysfunction.  Review of her medical records show suppressed TSH at least from 2015.  Most recent labs are from November 2020 showing suppressed TSH of 0.34, no free T4 or total T4 was measured simultaneously.   Her most recent, more complete thyroid function test confirms subclinical hyperthyroidism.  Patient still denies any recent weight change, palpitations, heat intolerance, tremors.  She denies any history of goiter, no family history of thyroid dysfunction or thyroid malignancy.  Her thyroid uptake and scan on June 14, 2019 showed 24-hour uptake of 10.3% which is normal.  There was asymmetric thyroid activity with generally increased uptake in the right lobe.  No focal hot or cold nodules identified. She is not currently on antithyroid treatment nor thyroid hormone supplements.  Her medical history includes hypertension and hyperlipidemia on treatment.  Review of Systems Limited as above.  Objective:    There were no vitals taken for this visit.  Wt Readings from Last 3 Encounters:  06/15/19 256 lb 6.4 oz (116.3 kg)  06/02/19 252 lb 3.2 oz (114.4 kg)  05/30/19 255 lb 3.2 oz (115.8 kg)    Physical Exam   CMP     Component Value Date/Time   NA 143 04/06/2019 1602   K 5.6 (H) 04/06/2019 1602   CL 109 (H) 04/06/2019 1602   CO2 21 04/06/2019 1602   GLUCOSE 93 04/06/2019 1602   GLUCOSE 92 12/21/2018 1547   BUN 26 04/06/2019 1602    CREATININE 1.54 (H) 04/06/2019 1602   CREATININE 1.31 (H) 12/21/2018 1547   CALCIUM 9.9 04/06/2019 1602   PROT 7.0 04/06/2019 1602   ALBUMIN 3.7 04/06/2019 1602   AST 11 04/06/2019 1602   ALT 8 04/06/2019 1602   ALKPHOS 95 04/06/2019 1602   BILITOT <0.2 04/06/2019 1602   GFRNONAA 32 (L) 04/06/2019 1602   GFRAA 37 (L) 04/06/2019 1602     Diabetic Labs (most recent): Lab Results  Component Value Date   HGBA1C 6.0 (H) 12/09/2015   HGBA1C 6.0 (H) 08/23/2013     Lipid Panel ( most recent) Lipid Panel     Component Value Date/Time   CHOL 168 04/06/2019 1602   TRIG 72 04/06/2019 1602   HDL 53 04/06/2019 1602   CHOLHDL 3.2 04/06/2019 1602   CHOLHDL 2.6 11/20/2016 0854   VLDL 11 11/20/2016 0854   LDLCALC 101 (H) 04/06/2019 1602   LABVLDL 14 04/06/2019 1602      Lab Results  Component Value Date   TSH 0.13 (L) 05/30/2019   TSH 0.347 (L) 04/06/2019   TSH 0.324 (L) 12/09/2015   TSH 0.292 (L) 10/19/2013   FREET4 0.9 05/30/2019   FREET4 1.04 12/11/2015   FREET4 1.09 10/19/2013      Assessment & Plan:   1. Subclinical hyperthyroidism -I discussed her labs and recent thyroid uptake imaging studies with her.  Her uptake and scan is not significant enough to warrant ablative treatment. She will benefit from low-dose methimazole intervention.  I discussed and initiated methimazole 5 mg p.o. daily at breakfast with plan to repeat thyroid function test in 3 months.  -If she has significant uptake, will be considered for I-131 thyroid ablation.  - she is advised to maintain close follow up with [name], [name], MD for primary care needs.     - Time spent on this patient care encounter:  20 minutes of which 50% was spent in  counseling and the rest reviewing  her current and  previous labs / studies and medications  doses and developing a plan for long term care. Shelly [name]  participated in the discussions, expressed understanding, and voiced agreement with the above  plans.  All questions were answered to her satisfaction. she is encouraged to contact clinic should she have any questions or concerns prior to her return visit.    Follow up plan: Return in about 3 months (around 09/20/2019) for Follow up with Pre-visit Labs.   Glade Lloyd, MD [facility] [address] [place], [place] [address] Phone: [phone]  Fax: [phone]     06/23/2019, 12:57 PM  This note was partially dictated with voice recognition software. Similar sounding words can be transcribed inadequately or may not  be corrected upon review.

## 2019-06-27 ENCOUNTER — Encounter: Payer: Self-pay | Admitting: Family Medicine

## 2019-06-27 ENCOUNTER — Ambulatory Visit (INDEPENDENT_AMBULATORY_CARE_PROVIDER_SITE_OTHER): Payer: Medicare Other | Admitting: Family Medicine

## 2019-06-27 DIAGNOSIS — H9202 Otalgia, left ear: Secondary | ICD-10-CM | POA: Diagnosis not present

## 2019-06-27 DIAGNOSIS — H60312 Diffuse otitis externa, left ear: Secondary | ICD-10-CM | POA: Diagnosis not present

## 2019-06-27 MED ORDER — NEOMYCIN-POLYMYXIN-HC 3.5-10000-1 OT SOLN: 3.0000 [drp] | Freq: Four times a day (QID) | OTIC | 0 refills | Status: AC

## 2019-06-27 NOTE — Progress Notes (Signed)
Virtual Visit via telephone Note Due to COVID-19 pandemic this visit was conducted virtually. This visit type was conducted due to national recommendations for restrictions regarding the COVID-19 Pandemic (e.g. social distancing, sheltering in place) in an effort to limit this patient's exposure and mitigate transmission in our community. All issues noted in this document were discussed and addressed.  A physical exam was not performed with this format.   I connected with Shelly [name] on 06/27/2019 at 1135 by telephone and verified that I am speaking with the correct person using two identifiers. Shelly [name] is currently located at home and family is currently with them during visit. The provider, Monia Pouch, FNP is located in their office at time of visit.  I discussed the limitations, risks, security and privacy concerns of performing an evaluation and management service by telephone and the availability of in person appointments. I also discussed with the patient that there may be a patient responsible charge related to this service. The patient expressed understanding and agreed to proceed.  Subjective:  Patient ID: Shelly [name], female    DOB: 1941/04/01, 79 y.o.   MRN: [id number]  Chief Complaint:  Otalgia (left)   HPI: Shelly [name] is a 79 y.o. female presenting on 06/27/2019 for Otalgia (left)   Otalgia  There is pain in the left ear. This is a recurrent problem. The current episode started in the past 7 days. The problem occurs constantly. The problem has been waxing and waning. There has been no fever. The pain is at a severity of 3/10. The pain is mild. Associated symptoms include ear discharge. Pertinent negatives include no abdominal pain, coughing, diarrhea, headaches, hearing loss, neck pain, rash, rhinorrhea, sore throat or vomiting. She has tried acetaminophen for the symptoms. The treatment provided no relief.     Relevant past medical, surgical, family,  and social history reviewed and updated as indicated.  Allergies and medications reviewed and updated.   Past Medical History:  Diagnosis Date  . Allergy   . Anemia    GI bleed  . Anginal pain ([place])   . Anxiety   . Arthritis   . Blood transfusion without reported diagnosis   . Cataract   . Chronic kidney disease   . Coronary artery disease    SEVERE  . GERD (gastroesophageal reflux disease)   . GI bleed 12/2013  . Goiter   . Hyperlipidemia   . Hypertension   . Myocardial infarct ([place])   . Myocardial infarction ([place]) 08/2013  . Neuralgia of right lower extremity 07/13/2017  . Pancolonic diverticulosis 12/2013  . Post herpetic neuralgia     Past Surgical History:  Procedure Laterality Date  . COLONOSCOPY N/A 01/13/2014   Dr. Hung:pandiverticulosis   . COLONOSCOPY N/A 12/14/2015   Dr. Michail Sermon: pancolonic diverticulosis, internal hemorrhoids   . CORNEAL TRANSPLANT    . CORONARY ANGIOPLASTY  08/2013  . ESOPHAGOGASTRODUODENOSCOPY N/A 12/14/2015   Dr. Michail Sermon: normal   . GIVENS CAPSULE STUDY N/A 08/13/2016   Dr. Oneida Alar: enteritis due to aspirin.   Marland Kitchen HEEL SPUR EXCISION    . LEFT HEART CATHETERIZATION WITH CORONARY ANGIOGRAM N/A 08/23/2013   Procedure: LEFT HEART CATHETERIZATION WITH CORONARY ANGIOGRAM;  Surgeon: Peter M Martinique, MD;  Location: [facility] CATH LAB;  Service: Cardiovascular;  Laterality: N/A;  . PERCUTANEOUS CORONARY STENT INTERVENTION (PCI-S) N/A 08/24/2013   Procedure: PERCUTANEOUS CORONARY STENT INTERVENTION (PCI-S);  Surgeon: Peter M Martinique, MD;  Location: [facility] CATH LAB;  Service: Cardiovascular;  Laterality: N/A;  . TONSILLECTOMY    . TUBAL LIGATION      Social History   Socioeconomic History  . Marital status: Widowed    Spouse name: Not on file  . Number of children: 8  . Years of education: 56  . Highest education level: Not on file  Occupational History  . 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 Smoking status: Never Smoker  .  Smokeless tobacco: Never Used  Substance and Sexual Activity  . Alcohol use: No    Alcohol/week: 0.0 standard drinks  . Drug use: No  . Sexual activity: Not Currently    Comment: divorced, lives with daughter and granddaughters  Other Topics Concern  . Not on file  Social History Narrative   Works in a group home   Lives with daughter   Two grand daughters    One son home most of the time   Right-handed.   No daily caffeine use.   Social Determinants of Health   Financial Resource Strain:   . Difficulty of Paying Living Expenses: Not on file  Food Insecurity:   . Worried About Charity fundraiser in the Last Year: Not on file  . Ran Out of Food in the Last Year: Not on file  Transportation Needs:   . Lack of Transportation (Medical): Not on file  . Lack of Transportation (Non-Medical): Not on file  Physical Activity:   . Days of Exercise per Week: Not on file  . Minutes of Exercise per Session: Not on file  Stress:   . Feeling of Stress : Not on file  Social Connections:   . Frequency of Communication with Friends and Family: Not on file  . Frequency of Social Gatherings with Friends and Family: Not on file  . Attends Religious Services: Not on file  . Active Member of Clubs or Organizations: Not on file  . Attends Archivist Meetings: Not on file  . Marital Status: Not on file  Intimate Partner Violence:   . Fear of Current or Ex-Partner: Not on file  . Emotionally Abused: Not on file  . Physically Abused: Not on file  . Sexually Abused: Not on file    Outpatient Encounter Medications as of 06/27/2019  Medication Sig  . b complex vitamins capsule Take 1 capsule by mouth daily.  . cetirizine (ZYRTEC) 10 MG tablet Take 10 mg by mouth daily.  . chlorthalidone (HYGROTON) 25 MG tablet Take 1 tablet (25 mg total) by mouth daily.  . clopidogrel (PLAVIX) 75 MG tablet TAKE ONE TABLET BY MOUTH DAILY  . clotrimazole-betamethasone (LOTRISONE) cream APPLY AS DIRECTED  TWICE DAILY.  Marland Kitchen diclofenac Sodium (VOLTAREN) 1 % GEL Apply 4 g topically 4 (four) times daily as needed.  Marland Kitchen lisinopril (ZESTRIL) 40 MG tablet Take 1 tablet (40 mg total) by mouth daily.  . methimazole (TAPAZOLE) 5 MG tablet Take 1 tablet (5 mg total) by mouth daily.  . metoprolol succinate (TOPROL XL) 25 MG 24 hr tablet Take 1 tablet (25 mg total) by mouth daily.  . Multiple Vitamin (MULTIVITAMIN) tablet Take 1 tablet by mouth daily.  Marland Kitchen neomycin-polymyxin-hydrocortisone (CORTISPORIN) OTIC solution Place 3 drops into the left ear 4 (four) times daily for 5 days.  . pantoprazole (PROTONIX) 40 MG tablet Take 1 tablet (40 mg total) by mouth daily. 30 minutes before breakfast  . Pregabalin (LYRICA PO) Take by mouth at bedtime as needed. Patient not sure of  strength  . rosuvastatin (CRESTOR) 5 MG tablet Take 1 tablet (5 mg total) by mouth daily.   No facility-administered encounter medications on file as of 06/27/2019.    Allergies  Allergen Reactions  . Nsaids Other (See Comments)    AVOID all NSAID due to history of GI bleeding  . Atorvastatin Other (See Comments)    Joint & muscle pain    Review of Systems  Constitutional: Negative for activity change, appetite change, chills, diaphoresis, fatigue, fever and unexpected weight change.  HENT: Positive for ear discharge and ear pain. Negative for congestion, facial swelling, hearing loss, postnasal drip, rhinorrhea, sinus pressure, sinus pain and sore throat.   Eyes: Negative.  Negative for photophobia, pain, discharge, redness, itching and visual disturbance.  Respiratory: Negative for cough, chest tightness and shortness of breath.   Cardiovascular: Negative for chest pain, palpitations and leg swelling.  Gastrointestinal: Negative for abdominal pain, blood in stool, constipation, diarrhea, nausea and vomiting.  Endocrine: Negative.   Genitourinary: Negative for decreased urine volume, dysuria, frequency and urgency.  Musculoskeletal:  Negative for arthralgias, myalgias and neck pain.  Skin: Negative.  Negative for rash.  Allergic/Immunologic: Negative.   Neurological: Negative for dizziness, weakness and headaches.  Hematological: Negative.   Psychiatric/Behavioral: Negative for confusion, hallucinations, sleep disturbance and suicidal ideas.  All other systems reviewed and are negative.        Observations/Objective: No vital signs or physical exam, this was a telephone or virtual health encounter.  Pt alert and oriented, answers all questions appropriately, and able to speak in full sentences.    Assessment and Plan: Sejla was seen today for otalgia.  Diagnoses and all orders for this visit:  Left ear pain Acute diffuse otitis externa of left ear Reported symptoms consistent with acute otitis externa. Symptomatic care discussed in detail. Medications as prescribed. Report any new, worsening, or persistent symptoms.  -     neomycin-polymyxin-hydrocortisone (CORTISPORIN) OTIC solution; Place 3 drops into the left ear 4 (four) times daily for 5 days.     Follow Up Instructions: Return if symptoms worsen or fail to improve.    I discussed the assessment and treatment plan with the patient. The patient was provided an opportunity to ask questions and all were answered. The patient agreed with the plan and demonstrated an understanding of the instructions.   The patient was advised to call back or seek an in-person evaluation if the symptoms worsen or if the condition fails to improve as anticipated.  The above assessment and management plan was discussed with the patient. The patient verbalized understanding of and has agreed to the management plan. Patient is aware to call the clinic if they develop any new symptoms or if symptoms persist or worsen. Patient is aware when to return to the clinic for a follow-up visit. Patient educated on when it is appropriate to go to the emergency department.    I provided  15 minutes of non-face-to-face time during this encounter. The call started at 1135. The call ended at 1150. The other time was used for coordination of care.    Monia Pouch, FNP-C [facility] [address] [place], [place] [address] [phone] 06/27/2019

## 2019-07-04 NOTE — Addendum Note (Signed)
Addended by: Loman Brooklyn on: 07/04/2019 10:47 AM   Modules accepted: Orders

## 2019-07-05 ENCOUNTER — Ambulatory Visit: Payer: Medicare Other | Admitting: Neurology

## 2019-07-06 ENCOUNTER — Other Ambulatory Visit: Payer: Self-pay

## 2019-07-07 ENCOUNTER — Ambulatory Visit (INDEPENDENT_AMBULATORY_CARE_PROVIDER_SITE_OTHER): Payer: Medicare Other | Admitting: Family Medicine

## 2019-07-07 ENCOUNTER — Encounter: Payer: Self-pay | Admitting: Family Medicine

## 2019-07-07 DIAGNOSIS — E0590 Thyrotoxicosis, unspecified without thyrotoxic crisis or storm: Secondary | ICD-10-CM

## 2019-07-07 DIAGNOSIS — I10 Essential (primary) hypertension: Secondary | ICD-10-CM

## 2019-07-07 DIAGNOSIS — N1832 Chronic kidney disease, stage 3b: Secondary | ICD-10-CM

## 2019-07-07 DIAGNOSIS — E782 Mixed hyperlipidemia: Secondary | ICD-10-CM | POA: Diagnosis not present

## 2019-07-07 DIAGNOSIS — B0229 Other postherpetic nervous system involvement: Secondary | ICD-10-CM

## 2019-07-07 DIAGNOSIS — K219 Gastro-esophageal reflux disease without esophagitis: Secondary | ICD-10-CM

## 2019-07-07 MED ORDER — CAPSAICIN 0.1 % EX CREA: 1.0000 "application " | TOPICAL_CREAM | Freq: Four times a day (QID) | CUTANEOUS | 5 refills | Status: DC | PRN

## 2019-07-07 MED ORDER — PREGABALIN 100 MG PO CAPS: 100.0000 mg | ORAL_CAPSULE | Freq: Every evening | ORAL | 5 refills | Status: DC | PRN

## 2019-07-07 NOTE — Progress Notes (Signed)
Virtual Visit via telephone Note  I connected with Shelly [name] on 07/07/19 at 1450 by telephone and verified that I am speaking with the correct person using two identifiers. Shelly [name] is currently located at home and no other people are currently with her during visit. The provider, [name] Kassondra Geil, MD is located in their office at time of visit.  Call ended at 1522  I discussed the limitations, risks, security and privacy concerns of performing an evaluation and management service by telephone and the availability of in person appointments. I also discussed with the patient that there may be a patient responsible charge related to this service. The patient expressed understanding and agreed to proceed.   History and Present Illness: Hypertension Patient is currently on chlorthalidone and lisinopril and metoprolol, and their blood pressure today is unknown. Patient denies any lightheadedness or dizziness. Patient denies headaches, blurred vision, chest pains, shortness of breath, or weakness. Denies any side effects from medication and is content with current medication.   GERD Patient is currently on pantaprazol.  She denies any major symptoms or abdominal pain or belching or burping. She denies any blood in her stool or lightheadedness or dizziness.   Hyperlipidemia Patient is coming in for recheck of his hyperlipidemia. The patient is currently taking crestor. They deny any issues with myalgias or history of liver damage from it. They deny any focal numbness or weakness or chest pain.   Subclinical hyperthyroidism recheck Patient is coming in for thyroid recheck today as well. They deny any issues with hair changes or heat or cold problems or diarrhea or constipation. They deny any chest pain or palpitations. They are currently on methimazole  Patient is still having pain in upper back that feels like electricity  In upper back and has not improved. She used tubes of  lidocaine and it did not help.    No diagnosis found.  Outpatient Encounter Medications as of 07/07/2019  Medication Sig  . b complex vitamins capsule Take 1 capsule by mouth daily.  . cetirizine (ZYRTEC) 10 MG tablet Take 10 mg by mouth daily.  . chlorthalidone (HYGROTON) 25 MG tablet Take 1 tablet (25 mg total) by mouth daily.  . clopidogrel (PLAVIX) 75 MG tablet TAKE ONE TABLET BY MOUTH DAILY  . clotrimazole-betamethasone (LOTRISONE) cream APPLY AS DIRECTED TWICE DAILY.  Marland Kitchen diclofenac Sodium (VOLTAREN) 1 % GEL Apply 4 g topically 4 (four) times daily as needed.  Marland Kitchen lisinopril (ZESTRIL) 40 MG tablet Take 1 tablet (40 mg total) by mouth daily.  . methimazole (TAPAZOLE) 5 MG tablet Take 1 tablet (5 mg total) by mouth daily.  . metoprolol succinate (TOPROL XL) 25 MG 24 hr tablet Take 1 tablet (25 mg total) by mouth daily.  . Multiple Vitamin (MULTIVITAMIN) tablet Take 1 tablet by mouth daily.  . pantoprazole (PROTONIX) 40 MG tablet Take 1 tablet (40 mg total) by mouth daily. 30 minutes before breakfast  . Pregabalin (LYRICA PO) Take by mouth at bedtime as needed. Patient not sure of strength  . rosuvastatin (CRESTOR) 5 MG tablet Take 1 tablet (5 mg total) by mouth daily.   No facility-administered encounter medications on file as of 07/07/2019.    Review of Systems  Constitutional: Negative for chills and fever.  Eyes: Negative for visual disturbance.  Respiratory: Negative for chest tightness and shortness of breath.   Cardiovascular: Negative for chest pain and leg swelling.  Musculoskeletal: Negative for arthralgias, back pain and gait problem.  Skin:  Negative for color change, rash and wound.  Neurological: Positive for numbness. Negative for weakness, light-headedness and headaches.  Psychiatric/Behavioral: Negative for agitation and behavioral problems.  All other systems reviewed and are negative.   Observations/Objective: Patient sounds comfortable and in no acute  distress  Assessment and Plan: Problem List Items Addressed This Visit      Cardiovascular and Mediastinum   Essential hypertension - Primary   Relevant Orders   CMP14+EGFR     Digestive   GERD (gastroesophageal reflux disease)   Relevant Orders   CBC with Differential/Platelet     Endocrine   Subclinical hyperthyroidism     Nervous and Auditory   Postherpetic neuralgia   Relevant Medications   Capsaicin 0.1 % CREA   pregabalin (LYRICA) 100 MG capsule   Other Relevant Orders   Ambulatory referral to Neurology     Genitourinary   CKD (chronic kidney disease), stage III   Relevant Orders   CMP14+EGFR    Other Visit Diagnoses    Mixed hyperlipidemia       Relevant Orders   Lipid panel      Continue current medication and try capsaicin and refill lyrica. Follow up plan: Return in about 3 months (around 10/04/2019), or if symptoms worsen or fail to improve, for htn and gerd.     I discussed the assessment and treatment plan with the patient. The patient was provided an opportunity to ask questions and all were answered. The patient agreed with the plan and demonstrated an understanding of the instructions.   The patient was advised to call back or seek an in-person evaluation if the symptoms worsen or if the condition fails to improve as anticipated.  The above assessment and management plan was discussed with the patient. The patient verbalized understanding of and has agreed to the management plan. Patient is aware to call the clinic if symptoms persist or worsen. Patient is aware when to return to the clinic for a follow-up visit. Patient educated on when it is appropriate to go to the emergency department.    I provided 31 minutes of non-face-to-face time during this encounter.    [name], MD

## 2019-07-11 ENCOUNTER — Other Ambulatory Visit (HOSPITAL_BASED_OUTPATIENT_CLINIC_OR_DEPARTMENT_OTHER): Payer: Self-pay

## 2019-07-11 DIAGNOSIS — G4710 Hypersomnia, unspecified: Secondary | ICD-10-CM

## 2019-07-12 ENCOUNTER — Telehealth: Payer: Medicare Other

## 2019-07-14 ENCOUNTER — Other Ambulatory Visit: Payer: Self-pay | Admitting: Family Medicine

## 2019-07-14 DIAGNOSIS — B0229 Other postherpetic nervous system involvement: Secondary | ICD-10-CM

## 2019-07-14 NOTE — Progress Notes (Signed)
Placed new referral to [facility] for the patient

## 2019-07-15 ENCOUNTER — Telehealth: Payer: Self-pay | Admitting: Family Medicine

## 2019-07-15 NOTE — Telephone Encounter (Signed)
I do not know anything about intervention[facility], I did a referral to neurology?

## 2019-07-15 NOTE — Telephone Encounter (Signed)
Left detailed message.   

## 2019-07-15 NOTE — Telephone Encounter (Signed)
Spoke with Brad from [facility] who said that he spoke with pt about [place] sending her to them for shingles pain but says they don't have an order for pt and have never seen pt before. Can fax order to them @ [phone] (for Dr Steffanie Dunn)

## 2019-07-15 NOTE — Telephone Encounter (Signed)
This has been finished by our referral personnel.

## 2019-07-15 NOTE — Telephone Encounter (Signed)
Yes she is fine to go ahead and take the Covid shot, there is no concerns without blood thinners, they are setting it but have not found any concerns with it.

## 2019-07-19 DIAGNOSIS — B0229 Other postherpetic nervous system involvement: Secondary | ICD-10-CM | POA: Diagnosis not present

## 2019-07-22 ENCOUNTER — Other Ambulatory Visit: Payer: Self-pay

## 2019-07-22 ENCOUNTER — Other Ambulatory Visit (HOSPITAL_COMMUNITY)
Admission: RE | Admit: 2019-07-22 | Discharge: 2019-07-22 | Disposition: A | Discharge status: Home / Self Care | Payer: Medicare Other | Source: Ambulatory Visit | Attending: Family Medicine | Admitting: Family Medicine

## 2019-07-22 ENCOUNTER — Encounter: Payer: Self-pay | Admitting: Cardiovascular Disease

## 2019-07-22 ENCOUNTER — Ambulatory Visit (INDEPENDENT_AMBULATORY_CARE_PROVIDER_SITE_OTHER): Payer: Medicare Other | Admitting: Cardiovascular Disease

## 2019-07-22 VITALS — BP 162/64 | HR 68 | Ht 64.0 in | Wt 255.0 lb

## 2019-07-22 DIAGNOSIS — I10 Essential (primary) hypertension: Secondary | ICD-10-CM | POA: Diagnosis not present

## 2019-07-22 DIAGNOSIS — I25118 Atherosclerotic heart disease of native coronary artery with other forms of angina pectoris: Secondary | ICD-10-CM

## 2019-07-22 DIAGNOSIS — N1832 Chronic kidney disease, stage 3b: Secondary | ICD-10-CM

## 2019-07-22 DIAGNOSIS — Z955 Presence of coronary angioplasty implant and graft: Secondary | ICD-10-CM

## 2019-07-22 DIAGNOSIS — E785 Hyperlipidemia, unspecified: Secondary | ICD-10-CM

## 2019-07-22 DIAGNOSIS — I252 Old myocardial infarction: Secondary | ICD-10-CM | POA: Diagnosis not present

## 2019-07-22 DIAGNOSIS — Z20822 Contact with and (suspected) exposure to covid-19: Secondary | ICD-10-CM | POA: Insufficient documentation

## 2019-07-22 DIAGNOSIS — Z01812 Encounter for preprocedural laboratory examination: Secondary | ICD-10-CM | POA: Insufficient documentation

## 2019-07-22 DIAGNOSIS — R600 Localized edema: Secondary | ICD-10-CM

## 2019-07-22 MED ORDER — FUROSEMIDE 40 MG PO TABS: 40.0000 mg | ORAL_TABLET | ORAL | 6 refills | Status: DC | PRN

## 2019-07-22 NOTE — Patient Instructions (Addendum)
Medication Instructions:   Lasix 40mg  - as needed for leg swelling or weight gain of 3 pounds in 1 day or 5 pounds in 1 week.   Continue all other medications.     Labwork: none  Testing/Procedures:  Your physician has requested that you have an echocardiogram. Echocardiography is a painless test that uses sound waves to create images of your heart. It provides your doctor with information about the size and shape of your heart and how well your heart's chambers and valves are working. This procedure takes approximately one hour. There are no restrictions for this procedure.  Office will contact with results via phone or letter.    Follow-Up: 6 months   Any Other Special Instructions Will Be Listed Below (If Applicable).  If you need a refill on your cardiac medications before your next appointment, please call your pharmacy.

## 2019-07-22 NOTE — Progress Notes (Signed)
SUBJECTIVE: Patient presents for follow-up of coronary artery disease. She sustained a non-STEMI and underwent overlapping stent placement to the proximal to mid RCA on 08/24/2013. She had residual moderate coronary artery disease.  She struggles with postherpetic neuralgia.  It has affected the T1-T6 dermatomes.  She was evaluated by neurology and they prescribed Trileptal and EMLA cream plus diclofenac cream.  Many of the therapies made her feel drowsy.  From a cardiac perspective, she denies chest pain, palpitations, and shortness of breath.  She does not have much of an appetite.  She continues to have bilateral leg edema.  ECG performed in the office today which I ordered and personally interpreted demonstrates normal sinus rhythm with no ischemic ST segment or T-wave abnormalities, nor any arrhythmias.    Social history: 2 of her daughters passed away within the past few years of cancer, both in their early 85s.  The patient has a strong faith.  Review of Systems: As per "subjective", otherwise negative.  Allergies  Allergen Reactions  . Nsaids Other (See Comments)    AVOID all NSAID due to history of GI bleeding  . Atorvastatin Other (See Comments)    Joint & muscle pain    Current Outpatient Medications  Medication Sig Dispense Refill  . b complex vitamins capsule Take 1 capsule by mouth daily.    . Capsaicin 0.1 % CREA Apply 1 application topically 4 (four) times daily as needed. 60 g 5  . cetirizine (ZYRTEC) 10 MG tablet Take 10 mg by mouth daily.    . chlorthalidone (HYGROTON) 25 MG tablet Take 1 tablet (25 mg total) by mouth daily. 30 tablet 1  . clopidogrel (PLAVIX) 75 MG tablet TAKE ONE TABLET BY MOUTH DAILY 90 tablet 3  . clotrimazole-betamethasone (LOTRISONE) cream APPLY AS DIRECTED TWICE DAILY. 45 g 1  . diclofenac Sodium (VOLTAREN) 1 % GEL Apply 4 g topically 4 (four) times daily as needed. 100 g 5  . lisinopril (ZESTRIL) 40 MG tablet Take 1 tablet (40 mg  total) by mouth daily. 90 tablet 3  . methimazole (TAPAZOLE) 5 MG tablet Take 1 tablet (5 mg total) by mouth daily. 30 tablet 3  . metoprolol succinate (TOPROL XL) 25 MG 24 hr tablet Take 1 tablet (25 mg total) by mouth daily. 90 tablet 3  . Multiple Vitamin (MULTIVITAMIN) tablet Take 1 tablet by mouth daily.    . pantoprazole (PROTONIX) 40 MG tablet Take 1 tablet (40 mg total) by mouth daily. 30 minutes before breakfast 90 tablet 3  . pregabalin (LYRICA) 100 MG capsule Take 1 capsule (100 mg total) by mouth at bedtime as needed. Patient not sure of strength 30 capsule 5  . rosuvastatin (CRESTOR) 5 MG tablet Take 1 tablet (5 mg total) by mouth daily. 90 tablet 3   No current facility-administered medications for this visit.    Past Medical History:  Diagnosis Date  . Allergy   . Anemia    GI bleed  . Anginal pain ([place])   . Anxiety   . Arthritis   . Blood transfusion without reported diagnosis   . Cataract   . Chronic kidney disease   . Coronary artery disease    SEVERE  . GERD (gastroesophageal reflux disease)   . GI bleed 12/2013  . Goiter   . Hyperlipidemia   . Hypertension   . Myocardial infarct ([place])   . Myocardial infarction ([place]) 08/2013  . Neuralgia of right lower extremity 07/13/2017  . Pancolonic  diverticulosis 12/2013  . Post herpetic neuralgia     Past Surgical History:  Procedure Laterality Date  . COLONOSCOPY N/A 01/13/2014   Dr. Hung:pandiverticulosis   . COLONOSCOPY N/A 12/14/2015   Dr. Michail Sermon: pancolonic diverticulosis, internal hemorrhoids   . CORNEAL TRANSPLANT    . CORONARY ANGIOPLASTY  08/2013  . ESOPHAGOGASTRODUODENOSCOPY N/A 12/14/2015   Dr. Michail Sermon: normal   . GIVENS CAPSULE STUDY N/A 08/13/2016   Dr. Oneida Alar: enteritis due to aspirin.   Marland Kitchen HEEL SPUR EXCISION    . LEFT HEART CATHETERIZATION WITH CORONARY ANGIOGRAM N/A 08/23/2013   Procedure: LEFT HEART CATHETERIZATION WITH CORONARY ANGIOGRAM;  Surgeon: Peter M Martinique, MD;  [facility] C[facility] LAB;   Service: Cardiovascular;  Laterality: N/A;  . PERCUTANEOUS CORONARY STENT INTERVENTION (PCI-S) N/A 08/24/2013   Procedure: PERCUTANEOUS CORONARY STENT INTERVENTION (PCI-S);  Surgeon: Peter M Martinique, MD;  Location: [facility] CATH LAB;  Service: Cardiovascular;  Laterality: N/A;  . TONSILLECTOMY    . TUBAL LIGATION      Social History   Socioeconomic History  . Marital status: Widowed    Spouse name: Not on file  . Number of children: 8  . Years of education: 39  . Highest education level: Not on file  Occupational History  . 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 Smoking status: Never Smoker  . Smokeless tobacco: Never Used  Substance and Sexual Activity  . Alcohol use: No    Alcohol/week: 0.0 standard drinks  . Drug use: No  . Sexual activity: Not Currently    Comment: divorced, lives with daughter and granddaughters  Other Topics Concern  . Not on file  Social History Narrative   Works in a group home   Lives with daughter   Two grand daughters    One son home most of the time   Right-handed.   No daily caffeine use.   Social Determinants of Health   Financial Resource Strain:   . Difficulty of Paying Living Expenses: Not on file  Food Insecurity:   . Worried About Charity fundraiser in the Last Year: Not on file  . Ran Out of Food in the Last Year: Not on file  Transportation Needs:   . Lack of Transportation (Medical): Not on file  . Lack of Transportation (Non-Medical): Not on file  Physical Activity:   . Days of Exercise per Week: Not on file  . Minutes of Exercise per Session: Not on file  Stress:   . Feeling of Stress : Not on file  Social Connections:   . Frequency of Communication with Friends and Family: Not on file  . Frequency of Social Gatherings with Friends and Family: Not on file  . Attends Religious Services: Not on file  . Active Member of Clubs or Organizations: Not on file  . Attends Archivist Meetings: Not on  file  . Marital Status: Not on file  Intimate Partner Violence:   . Fear of Current or Ex-Partner: Not on file  . Emotionally Abused: Not on file  . Physically Abused: Not on file  . Sexually Abused: Not on file    Orson Slick, LPN was present throughout the entirety of the encounter.  Vitals:   07/22/19 1514  BP: (!) 162/64  Pulse: 68  SpO2: 94%  Weight: 255 lb (115.7 kg)  Height: 5\' 4"  (1.626 m)    Wt Readings from Last 3 Encounters:  07/22/19 255 lb (115.7 kg)  06/15/19 256 lb 6.4 oz (116.3 kg)  06/02/19 252 lb 3.2 oz (114.4 kg)     PHYSICAL EXAM General: NAD HEENT: Normal. Neck: No JVD, no thyromegaly. Lungs: Clear to auscultation bilaterally with normal respiratory effort. CV: Regular rate and rhythm, normal S1/S2, no S3/S4, no murmur. Bilateral lower extremity nonpitting edema. No carotid bruit.   Abdomen: Soft, nontender, no distention.  Neurologic: Alert and oriented.  Psych: Normal affect. Skin: Normal. Musculoskeletal: No gross deformities.      Labs: Lab Results  Component Value Date/Time   K 5.6 (H) 04/06/2019 04:02 PM   BUN 26 04/06/2019 04:02 PM   CREATININE 1.54 (H) 04/06/2019 04:02 PM   CREATININE 1.31 (H) 12/21/2018 03:47 PM   ALT 8 04/06/2019 04:02 PM   TSH 0.13 (L) 05/30/2019 04:37 PM   HGB 10.2 (L) 06/08/2019 03:55 PM   HGB 10.6 (L) 04/06/2019 04:02 PM     Lipids: Lab Results  Component Value Date/Time   LDLCALC 101 (H) 04/06/2019 04:02 PM   CHOL 168 04/06/2019 04:02 PM   TRIG 72 04/06/2019 04:02 PM   HDL 53 04/06/2019 04:02 PM       ASSESSMENT AND PLAN:  1. CAD s/p NSTEMI with overlapping stents in the proximal to mid RCA on 08/24/13: Stable ischemic heart disease. She has residual moderate CAD but denies recurrent anginal symptoms.  Aspirin led to bleeding but she is tolerating clopidogrel.  Continue beta-blocker and rosuvastatin.  2.Hypertension:Blood pressure is elevated today.  Amlodipine previously led to lower extremity  edema.  She is having pain from postherpetic neuralgia and also describes some stress in her life.  This will need further monitoring.  3. Hyperlipidemia:LDL 100 on 08/30/18.    She did not tolerate atorvastatin 80 mg.  She is tolerating rosuvastatin 5 mg.  Goal LDL less than 70.  4. Chronic kidney disease stage III: Labs reviewed above.  5.  Bilateral leg edema: I will obtain an echocardiogram to evaluate cardiac structure and function.  I will prescribe Lasix 40 mg to be used as needed.  I told her to wear herself daily and should weight up by 3 pounds in 24 hours she can take a dose of Lasix.  She also uses compression stockings.  I have recommended leg elevation while at home.    Disposition: Follow up 6 months virtual visit   [name], M.D., F.A.C.C.

## 2019-07-23 LAB — SARS CORONAVIRUS 2 (TAT 6-24 HRS): SARS Coronavirus 2: NEGATIVE

## 2019-07-24 ENCOUNTER — Ambulatory Visit: Payer: Medicare Other | Attending: Family Medicine | Admitting: Neurology

## 2019-07-24 ENCOUNTER — Emergency Department (HOSPITAL_COMMUNITY)
Admission: EM | Admit: 2019-07-24 | Discharge: 2019-07-24 | Discharge status: Absent without leave | Payer: Medicare Other

## 2019-07-24 DIAGNOSIS — G4710 Hypersomnia, unspecified: Secondary | ICD-10-CM

## 2019-07-24 DIAGNOSIS — R0683 Snoring: Secondary | ICD-10-CM | POA: Insufficient documentation

## 2019-07-24 DIAGNOSIS — G4733 Obstructive sleep apnea (adult) (pediatric): Secondary | ICD-10-CM | POA: Diagnosis not present

## 2019-08-01 ENCOUNTER — Encounter: Payer: Self-pay | Admitting: *Deleted

## 2019-08-01 ENCOUNTER — Ambulatory Visit: Payer: Medicare Other | Admitting: *Deleted

## 2019-08-01 NOTE — Procedures (Signed)
Daisy A. Merlene Laughter, MD     [email or web address]             NOCTURNAL POLYSOMNOGRAPHY   LOCATION: [facility]  Patient Name: Shelly [name], Shelly [name] Date: 07/24/2019 Gender: Female D.O.B: [birth year] Age (years): 79 Referring Provider: Hendricks Limes Height (inches): 64 Interpreting Physician: Phillips Odor MD, ABSM Weight (lbs): 255 RPSGT: Rosebud Poles BMI: 44 MRN: [id number] Neck Size: 16.00 CLINICAL INFORMATION Sleep Study Type: NPSG     Indication for sleep study: N/A     Epworth Sleepiness Score: 14     SLEEP STUDY TECHNIQUE As per the AASM Manual for the Scoring of Sleep and Associated Events v2.3 (April 2016) with a hypopnea requiring 4% desaturations.  The channels recorded and monitored were frontal, central and occipital EEG, electrooculogram (EOG), submentalis EMG (chin), nasal and oral airflow, thoracic and abdominal wall motion, anterior tibialis EMG, snore microphone, electrocardiogram, and pulse oximetry.  MEDICATIONS Medications self-administered by patient taken the night of the study : N/A  Current Outpatient Medications:  .  b complex vitamins capsule, Take 1 capsule by mouth daily., Disp: , Rfl:  .  Capsaicin 0.1 % CREA, Apply 1 application topically 4 (four) times daily as needed., Disp: 60 g, Rfl: 5 .  cetirizine (ZYRTEC) 10 MG tablet, Take 10 mg by mouth daily., Disp: , Rfl:  .  chlorthalidone (HYGROTON) 25 MG tablet, Take 1 tablet (25 mg total) by mouth daily., Disp: 30 tablet, Rfl: 1 .  clopidogrel (PLAVIX) 75 MG tablet, TAKE ONE TABLET BY MOUTH DAILY, Disp: 90 tablet, Rfl: 3 .  clotrimazole-betamethasone (LOTRISONE) cream, APPLY AS DIRECTED TWICE DAILY., Disp: 45 g, Rfl: 1 .  diclofenac Sodium (VOLTAREN) 1 % GEL, Apply 4 g topically 4 (four) times daily as needed., Disp: 100 g, Rfl: 5 .  furosemide (LASIX) 40 MG tablet, Take 1 tablet (40 mg total) by mouth as needed for edema (weight gain)., Disp: 30 tablet, Rfl:  6 .  lisinopril (ZESTRIL) 40 MG tablet, Take 1 tablet (40 mg total) by mouth daily., Disp: 90 tablet, Rfl: 3 .  methimazole (TAPAZOLE) 5 MG tablet, Take 1 tablet (5 mg total) by mouth daily., Disp: 30 tablet, Rfl: 3 .  metoprolol succinate (TOPROL XL) 25 MG 24 hr tablet, Take 1 tablet (25 mg total) by mouth daily., Disp: 90 tablet, Rfl: 3 .  Multiple Vitamin (MULTIVITAMIN) tablet, Take 1 tablet by mouth daily., Disp: , Rfl:  .  pantoprazole (PROTONIX) 40 MG tablet, Take 1 tablet (40 mg total) by mouth daily. 30 minutes before breakfast, Disp: 90 tablet, Rfl: 3 .  pregabalin (LYRICA) 100 MG capsule, Take 1 capsule (100 mg total) by mouth at bedtime as needed. Patient not sure of strength, Disp: 30 capsule, Rfl: 5 .  rosuvastatin (CRESTOR) 5 MG tablet, Take 1 tablet (5 mg total) by mouth daily., Disp: 90 tablet, Rfl: 3     SLEEP ARCHITECTURE The study was initiated at 10:55:55 PM and ended at 5:55:19 AM.  Sleep onset time was 96.8 minutes and the sleep efficiency was 67.4%%. The total sleep time was 282.6 minutes.  Stage REM latency was 56.5 minutes.  The patient spent 2.1%% of the night in stage N1 sleep, 54.5%% in stage N2 sleep, 26.7%% in stage N3 and 16.7% in REM.  Alpha intrusion was absent.  Supine sleep was 25.83%.  RESPIRATORY PARAMETERS The overall apnea/hypopnea index (AHI) was 4.0 per hour. There were 2 total apneas, including 1 obstructive, 1 central and 0 mixed apneas.  There were 17 hypopneas and 0 RERAs.  The AHI during Stage REM sleep was 17.8 per hour.  AHI while supine was 1.6 per hour.  The mean oxygen saturation was 96.3%. The minimum SpO2 during sleep was 91.0%.  moderate snoring was noted during this study.  CARDIAC DATA The 2 lead EKG demonstrated sinus rhythm. The mean heart rate was 71.4 beats per minute. Other EKG findings include: None.  LEG MOVEMENT DATA The total PLMS were 0 with a resulting PLMS index of 0.0. Associated arousal with leg movement index  was 0.0.  IMPRESSIONS No significant obstructive sleep apnea occurred during this study. No significant central sleep apnea occurred during this study.   Delano Metz, MD Diplomate, American Board of Sleep Medicine.  ELECTRONICALLY SIGNED ON:  08/01/2019, 12:21 PM [place] PH: [phone]   FX: [phone] [place]

## 2019-08-01 NOTE — Chronic Care Management (AMB) (Signed)
  Chronic Care Management   Outreach Note  08/01/2019 Name: Shelly [name] MRN: [id number] DOB: 23-Sep-1940  Referred by: [name], [name], MD Reason for referral : Chronic Care Management (RN Initial Visit (2nd attempt))   A second unsuccessful outreach was attempted today for an initial telephone visit. The patient was referred to the case management team for assistance with care management and care coordination by her healthplan.   Follow Up Plan: The care management team will reach out to the patient again over the next 45 days.   [name], BSN, RN-BC Embedded Chronic Care Manager [facility] [place] [facility] Direct Dial: [phone]

## 2019-08-03 ENCOUNTER — Ambulatory Visit (INDEPENDENT_AMBULATORY_CARE_PROVIDER_SITE_OTHER): Payer: Medicare Other | Admitting: *Deleted

## 2019-08-03 DIAGNOSIS — Z0000 Encounter for general adult medical examination without abnormal findings: Secondary | ICD-10-CM

## 2019-08-03 NOTE — Progress Notes (Signed)
MEDICARE ANNUAL WELLNESS VISIT  08/03/2019  Telephone Visit Disclaimer This Medicare AWV was conducted by telephone due to national recommendations for restrictions regarding the COVID-19 Pandemic (e.g. social distancing).  I verified, using two identifiers, that I am speaking with Shelly [name] or their authorized healthcare agent. I discussed the limitations, risks, security, and privacy concerns of performing an evaluation and management service by telephone and the potential availability of an in-person appointment in the future. The patient expressed understanding and agreed to proceed.   Subjective:  Shelly [name] is a 79 y.o. female patient of [name], [name], MD who had a Medicare Annual Wellness Visit today via telephone. Shelly [name] is Retired and lives with their family. she has 6 living children and 2 have passed away. she reports that she is socially active and does interact with friends/family regularly. she is minimally physically active and enjoys coloring and doing word search puzzles.  Patient Care Team: [name], [name], MD as PCP - General (Family Medicine) Herminio Commons, MD as PCP - Cardiology (Cardiology) Danie Binder, MD as Consulting Physician (Gastroenterology) Ilean China, RN as Registered Nurse  Advanced Directives 08/03/2019 08/02/2018 11/18/2016 10/02/2016 04/04/2016 04/03/2016 12/09/2015  Does Patient Have a Medical Advance Directive? Yes Yes Yes No Yes No;Yes Yes  Type of Paramedic of [place];Living will Living will;Healthcare Power of Attorney Out of facility DNR (pink MOST or yellow form) Out of facility DNR (pink MOST or yellow form) [place];Living will [place];Living will [place]  Does patient want to make changes to medical advance directive? No - Patient declined - No - Patient declined - No - Patient declined - -  Copy of [place] in Chart? Yes - validated most recent copy scanned in chart (See row information) No - copy requested - - No - copy requested No - copy requested No - copy requested  Would patient like information on creating a medical advance directive? - - - Yes (MAU/Ambulatory/Procedural Areas - Information given) - - -  Pre-existing out of facility DNR order (yellow form or pink MOST form) - - Yellow form placed in chart (order not valid for inpatient use) Yellow form placed in chart (order not valid for inpatient use) - - [facility] Utilization Over the Past 12 Months: # of hospitalizations or ER visits: 0 # of surgeries: 0  Review of Systems    Patient reports that her overall health is worse compared to last year.  History obtained from chart review  Patient Reported Readings (BP, Pulse, CBG, Weight, etc) none  Pain Assessment Pain : 0-10 Pain Score: 8  Pain Type: Chronic pain Pain Location: Knee Pain Orientation: Left Pain Descriptors / Indicators: Constant Pain Onset: More than a month ago Effect of Pain on Daily Activities: moderate-has to take frequent breaks     Current Medications & Allergies (verified) Allergies as of 08/03/2019      Reactions   Nsaids Other (See Comments)   AVOID all NSAID due to history of GI bleeding   Atorvastatin Other (See Comments)   Joint & muscle pain      Medication List       Accurate as of August 03, 2019  9:14 AM. If you have any questions, ask your nurse or doctor.        STOP taking these medications   diclofenac Sodium 1 % Gel Commonly known  as: VOLTAREN     TAKE these medications   b complex vitamins capsule Take 1 capsule by mouth daily.   Capsaicin 0.1 % Crea Apply 1 application topically 4 (four) times daily as needed.   cetirizine 10 MG tablet Commonly known as: ZYRTEC Take 10 mg by mouth daily.   chlorthalidone 25 MG tablet Commonly known as: HYGROTON Take 1 tablet (25 mg total) by mouth daily.     clopidogrel 75 MG tablet Commonly known as: PLAVIX TAKE ONE TABLET BY MOUTH DAILY   clotrimazole-betamethasone cream Commonly known as: LOTRISONE APPLY AS DIRECTED TWICE DAILY.   furosemide 40 MG tablet Commonly known as: Lasix Take 1 tablet (40 mg total) by mouth as needed for edema (weight gain).   lisinopril 40 MG tablet Commonly known as: ZESTRIL Take 1 tablet (40 mg total) by mouth daily.   methimazole 5 MG tablet Commonly known as: TAPAZOLE Take 1 tablet (5 mg total) by mouth daily.   metoprolol succinate 25 MG 24 hr tablet Commonly known as: Toprol XL Take 1 tablet (25 mg total) by mouth daily.   multivitamin tablet Take 1 tablet by mouth daily.   pantoprazole 40 MG tablet Commonly known as: PROTONIX Take 1 tablet (40 mg total) by mouth daily. 30 minutes before breakfast   pregabalin 100 MG capsule Commonly known as: Lyrica Take 1 capsule (100 mg total) by mouth at bedtime as needed. Patient not sure of strength   rosuvastatin 5 MG tablet Commonly known as: CRESTOR Take 1 tablet (5 mg total) by mouth daily.       History (reviewed): Past Medical History:  Diagnosis Date  . Allergy   . Anemia    GI bleed  . Anginal pain ([place])   . Anxiety   . Arthritis   . Blood transfusion without reported diagnosis   . Cataract   . Chronic kidney disease   . Coronary artery disease    SEVERE  . GERD (gastroesophageal reflux disease)   . GI bleed 12/2013  . Goiter   . Hyperlipidemia   . Hypertension   . Myocardial infarct ([place])   . Myocardial infarction ([place]) 08/2013  . Neuralgia of right lower extremity 07/13/2017  . Pancolonic diverticulosis 12/2013  . Post herpetic neuralgia    Past Surgical History:  Procedure Laterality Date  . CARPAL TUNNEL RELEASE Bilateral   . COLONOSCOPY N/A 01/13/2014   Dr. Hung:pandiverticulosis   . COLONOSCOPY N/A 12/14/2015   Dr. Michail Sermon: pancolonic diverticulosis, internal hemorrhoids   . CORNEAL TRANSPLANT    . CORONARY  ANGIOPLASTY  08/2013  . ESOPHAGOGASTRODUODENOSCOPY N/A 12/14/2015   Dr. Michail Sermon: normal   . GIVENS CAPSULE STUDY N/A 08/13/2016   Dr. Oneida Alar: enteritis due to aspirin.   Marland Kitchen HEEL SPUR EXCISION    . LEFT HEART CATHETERIZATION WITH CORONARY ANGIOGRAM N/A 08/23/2013   Procedure: LEFT HEART CATHETERIZATION WITH CORONARY ANGIOGRAM;  Surgeon: Peter M Martinique, MD;  Location[facility] CATH LAB;  Service: Cardiovascular;  Laterality: N/A;  . PERCUTANEOUS CORONARY STENT INTERVENTION (PCI-S) N/A 08/24/2013   Procedure: PERCUTANEOUS CORONARY STENT INTERVENTION (PCI-S);  Surgeon: Peter M Martinique, MD;  Location: [facility] CATH LAB;  Service: Cardiovascular;  Laterality: N/A;  . TONSILLECTOMY    . TUBAL LIGATION     Family History  Problem Relation Age of Onset  . Hypertension Brother   . Transient ischemic attack Brother   . Cancer Mother        unsure of type   . Other Father  grangrene   . Asthma Sister   . Stroke Sister   . COPD Sister   . Stroke Sister   . Early death Brother   . Early death Brother   . Liver cancer Daughter   . Cancer Daughter   . Hernia Son        Umbilical  . GI problems Son   . Pancreatic disease Daughter        pancreatectomy  . Cancer Daughter   . Diverticulitis Daughter   . Arthritis Daughter        knee   . Arthritis Daughter        shoulder   . Anemia Daughter   . GER disease Son    Social History   Socioeconomic History  . Marital status: Divorced    Spouse name: Not on file  . Number of children: 8  . Years of education: 21  . Highest education level: 11th grade  Occupational History  . 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 Smoking status: Never Smoker  . Smokeless tobacco: Never Used  Substance and Sexual Activity  . Alcohol use: No    Alcohol/week: 0.0 standard drinks  . Drug use: No  . Sexual activity: Not Currently    Birth control/protection: Surgical    Comment: divorced, lives with daughter and granddaughters   Other Topics Concern  . 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   No daily caffeine use.   Social Determinants of Health   Financial Resource Strain: Low Risk   . Difficulty of Paying Living Expenses: Not very hard  Food Insecurity: No Food Insecurity  . Worried About Charity fundraiser in the Last Year: Never true  . Ran Out of Food in the Last Year: Never true  Transportation Needs: No Transportation Needs  . Lack of Transportation (Medical): No  . Lack of Transportation (Non-Medical): No  Physical Activity: Inactive  . Days of Exercise per Week: 0 days  . Minutes of Exercise per Session: 0 min  Stress: No Stress Concern Present  . Feeling of Stress : Not at all  Social Connections: Slightly Isolated  . Frequency of Communication with Friends and Family: More than three times a week  . Frequency of Social Gatherings with Friends and Family: More than three times a week  . Attends Religious Services: More than 4 times per year  . Active Member of Clubs or Organizations: Yes  . Attends Archivist Meetings: More than 4 times per year  . Marital Status: Divorced    Activities of Daily Living In your present state of health, do you have any difficulty performing the following activities: 08/03/2019  Hearing? N  Vision? Y  Comment blurred vision at times-due for eye exam-will schedule  Difficulty concentrating or making decisions? N  Walking or climbing stairs? Y  Comment due to her left knee pain  Dressing or bathing? N  Doing errands, shopping? N  Preparing Food and eating ? N  Using the Toilet? N  In the past six months, have you accidently leaked urine? N  Do you have problems with loss of bowel control? N  Managing your Medications? Y  Comment her daughter has to go through her medications to help her so she doesn't  get them mixed up    Managing your Finances? N  Housekeeping or managing your Housekeeping? Y  Comment family does most of the daily chores  Some recent data might be hidden    Patient Education/ Literacy How often do you need to have someone help you when you read instructions, pamphlets, or other written materials from your doctor or pharmacy?: 1 - Never What is the last grade level you completed in school?: 11th grade  Exercise Current Exercise Habits: The patient does not participate in regular exercise at present, Exercise limited by: orthopedic condition(s)  Diet Patient reports consuming 2 meals a day and 2 snack(s) a day Patient reports that her primary diet is: Regular Patient reports that she does have regular access to food.   Depression Screen PHQ 2/9 Scores 08/03/2019 06/15/2019 04/06/2019 12/31/2018 10/08/2018 09/28/2018 09/17/2018  PHQ - 2 Score 1 0 0 0 2 1 1   PHQ- 9 Score - - - - 9 4 -     Fall Risk Fall Risk  08/03/2019 06/15/2019 04/06/2019 12/31/2018 09/17/2018  Falls in the past year? 1 0 0 0 0  Number falls in past yr: 1 - - - -  Injury with Fall? 0 - - - -     Objective:  Shelly [name] seemed alert and oriented and she participated appropriately during our telephone visit.  Blood Pressure Weight BMI  BP Readings from Last 3 Encounters:  07/22/19 (!) 162/64  06/15/19 115/62  06/02/19 (!) 158/73   Wt Readings from Last 3 Encounters:  07/22/19 255 lb (115.7 kg)  06/15/19 256 lb 6.4 oz (116.3 kg)  06/02/19 252 lb 3.2 oz (114.4 kg)   BMI Readings from Last 1 Encounters:  07/22/19 43.77 kg/m    *Unable to obtain current vital signs, weight, and BMI due to telephone visit type  Hearing/Vision  . Elka did not seem to have difficulty with hearing/understanding during the telephone conversation . Reports that she has not had a formal eye exam by an eye care professional within the past year . Reports that she has not had a formal hearing evaluation within the past year *Unable  to fully assess hearing and vision during telephone visit type  Cognitive Function: 6CIT Screen 08/03/2019  What Year? 0 points  What month? 0 points  What time? 0 points  Count back from 20 0 points  Months in reverse 4 points  Repeat phrase 0 points  Total Score 4   (Normal:0-7, Significant for Dysfunction: >8)  Normal Cognitive Function Screening: Yes   Immunization & Health Maintenance Record Immunization History  Administered Date(s) Administered  . Pneumococcal Conjugate-13 10/02/2016  . Pneumococcal Polysaccharide-23 08/25/2013  . Zoster Recombinat (Shingrix) 03/17/2018    Health Maintenance  Topic Date Due  . INFLUENZA VACCINE  08/17/2019 (Originally 12/18/2018)  . TETANUS/TDAP  10/03/2026 (Originally 06/14/1959)  . DEXA SCAN  Completed  . PNA vac Low Risk Adult  Completed       Assessment  This is a routine wellness examination for [employer].  Health Maintenance: Due or Overdue There are no preventive care reminders to display for this patient.  Shelly [name] does not need a referral for Community Assistance: Care Management:   no Social Work:    no Prescription Assistance:  no Nutrition/Diabetes Education:  no   Plan:  Personalized Goals Goals Addressed            This Visit's Progress   . DIET - INCREASE WATER INTAKE  Try to drink 6-8 glasses of water daily      Personalized Health Maintenance & Screening Recommendations  Td vaccine Shingles vaccine  Lung Cancer Screening Recommended: no (Low Dose CT Chest recommended if Age 75-80 years, 30 pack-year currently smoking OR have quit w/in past 15 years) Hepatitis C Screening recommended: no HIV Screening recommended: no  Advanced Directives: Written information was not prepared per patient's request.  Referrals & Orders No orders of the defined types were placed in this encounter.   Follow-up Plan . Follow-up with [name], [name], MD as planned . Consider TDAP and  Shingles vaccines at your next visit with your PCP   I have personally reviewed and noted the following in the patient's chart:   . Medical and social history . Use of alcohol, tobacco or illicit drugs  . Current medications and supplements . Functional ability and status . Nutritional status . Physical activity . Advanced directives . List of other physicians . Hospitalizations, surgeries, and ER visits in previous 12 months . Vitals . Screenings to include cognitive, depression, and falls . Referrals and appointments  In addition, I have reviewed and discussed with Shelly [name] certain preventive protocols, quality metrics, and best practice recommendations. A written personalized care plan for preventive services as well as general preventive health recommendations is available and can be mailed to the patient at her request.      Milas Hock, LPN  02/02/9149

## 2019-08-03 NOTE — Patient Instructions (Signed)
Preventive Care 78 Years and Older, Female Preventive care refers to lifestyle choices and visits with your health care provider that can promote health and wellness. This includes:  A yearly physical exam. This is also called an annual well check.  Regular dental and eye exams.  Immunizations.  Screening for certain conditions.  Healthy lifestyle choices, such as diet and exercise. What can I expect for my preventive care visit? Physical exam Your health care provider will check:  Height and weight. These may be used to calculate body mass index (BMI), which is a measurement that tells if you are at a healthy weight.  Heart rate and blood pressure.  Your skin for abnormal spots. Counseling Your health care provider may ask you questions about:  Alcohol, tobacco, and drug use.  Emotional well-being.  Home and relationship well-being.  Sexual activity.  Eating habits.  History of falls.  Memory and ability to understand (cognition).  Work and work Statistician.  Pregnancy and menstrual history. What immunizations do I need?  Influenza (flu) vaccine  This is recommended every year. Tetanus, diphtheria, and pertussis (Tdap) vaccine  You may need a Td booster every 10 years. Varicella (chickenpox) vaccine  You may need this vaccine if you have not already been vaccinated. Zoster (shingles) vaccine  You may need this after age 33. Pneumococcal conjugate (PCV13) vaccine  One dose is recommended after age 33. Pneumococcal polysaccharide (PPSV23) vaccine  One dose is recommended after age 72. Measles, mumps, and rubella (MMR) vaccine  You may need at least one dose of MMR if you were born in 1957 or later. You may also need a second dose. Meningococcal conjugate (MenACWY) vaccine  You may need this if you have certain conditions. Hepatitis A vaccine  You may need this if you have certain conditions or if you travel or work in places where you may be exposed  to hepatitis A. Hepatitis B vaccine  You may need this if you have certain conditions or if you travel or work in places where you may be exposed to hepatitis B. Haemophilus influenzae type b (Hib) vaccine  You may need this if you have certain conditions. You may receive vaccines as individual doses or as more than one vaccine together in one shot (combination vaccines). Talk with your health care provider about the risks and benefits of combination vaccines. What tests do I need? Blood tests  Lipid and cholesterol levels. These may be checked every 5 years, or more frequently depending on your overall health.  Hepatitis C test.  Hepatitis B test. Screening  Lung cancer screening. You may have this screening every year starting at age 39 if you have a 30-pack-year history of smoking and currently smoke or have quit within the past 15 years.  Colorectal cancer screening. All adults should have this screening starting at age 36 and continuing until age 15. Your health care provider may recommend screening at age 23 if you are at increased risk. You will have tests every 1-10 years, depending on your results and the type of screening test.  Diabetes screening. This is done by checking your blood sugar (glucose) after you have not eaten for a while (fasting). You may have this done every 1-3 years.  Mammogram. This may be done every 1-2 years. Talk with your health care provider about how often you should have regular mammograms.  BRCA-related cancer screening. This may be done if you have a family history of breast, ovarian, tubal, or peritoneal cancers.  Other tests  Sexually transmitted disease (STD) testing.  Bone density scan. This is done to screen for osteoporosis. You may have this done starting at age 44. Follow these instructions at home: Eating and drinking  Eat a diet that includes fresh fruits and vegetables, whole grains, lean protein, and low-fat dairy products. Limit  your intake of foods with high amounts of sugar, saturated fats, and salt.  Take vitamin and mineral supplements as recommended by your health care provider.  Do not drink alcohol if your health care provider tells you not to drink.  If you drink alcohol: ? Limit how much you have to 0-1 drink a day. ? Be aware of how much alcohol is in your drink. In the [place] one drink equals one 12 oz bottle of beer (355 mL), one 5 oz glass of wine (148 mL), or one 1 oz glass of hard liquor (44 mL). Lifestyle  Take daily care of your teeth and gums.  Stay active. Exercise for at least 30 minutes on 5 or more days each week.  Do not use any products that contain nicotine or tobacco, such as cigarettes, e-cigarettes, and chewing tobacco. If you need help quitting, ask your health care provider.  If you are sexually active, practice safe sex. Use a condom or other form of protection in order to prevent STIs (sexually transmitted infections).  Talk with your health care provider about taking a low-dose aspirin or statin. What's next?  Go to your health care provider once a year for a well check visit.  Ask your health care provider how often you should have your eyes and teeth checked.  Stay up to date on all vaccines. This information is not intended to replace advice given to you by your health care provider. Make sure you discuss any questions you have with your health care provider. Document Revised: 04/29/2018 Document Reviewed: 04/29/2018 Elsevier Patient Education  2020 [employer].

## 2019-08-15 DIAGNOSIS — B0229 Other postherpetic nervous system involvement: Secondary | ICD-10-CM | POA: Diagnosis not present

## 2019-08-16 ENCOUNTER — Ambulatory Visit: Payer: Medicare Other | Admitting: Neurology

## 2019-08-22 ENCOUNTER — Ambulatory Visit (INDEPENDENT_AMBULATORY_CARE_PROVIDER_SITE_OTHER): Payer: Medicare Other | Admitting: *Deleted

## 2019-08-22 DIAGNOSIS — I252 Old myocardial infarction: Secondary | ICD-10-CM

## 2019-08-22 DIAGNOSIS — I10 Essential (primary) hypertension: Secondary | ICD-10-CM

## 2019-08-22 DIAGNOSIS — B0229 Other postherpetic nervous system involvement: Secondary | ICD-10-CM

## 2019-08-22 NOTE — Patient Instructions (Signed)
Visit Information  Goals Addressed            This Visit's Progress   . Chronic Disease Management Needs       CARE PLAN ENTRY (see longtitudinal plan of care for additional care plan information)  Current Barriers:  . Chronic Disease Management support, education, and care coordination needs related to HTN, CAD, MI, arthritis, CKD, diverticulosis, hyperlipidemia, anemia, anxiety  Clinical Goal(s) related to HTN, CAD, MI, arthritis, CKD, diverticulosis, hyperlipidemia, anemia, anxiety:  Over the next 45 days, patient will:  . Work with the care management team to address educational, disease management, and care coordination needs  . Call provider office for new or worsened signs and symptoms of chronic medical conditions . Call care management team with questions or concerns . Verbalize basic understanding of patient centered plan of care established today  Interventions related to HTN, CAD, MI, arthritis, CKD, diverticulosis, hyperlipidemia, anemia, anxiety:  . Evaluation of current treatment plans and patient's adherence to plan as established by provider . Assessed patient understanding of disease states . Assessed patient's education and care coordination needs . Provided disease specific education to patient  . Collaborated with appropriate clinical care team members regarding patient needs . Reviewed and discussed medications and their roles in her health . Encouraged patient to reach out to [place] as needed  Patient Self Care Activities related to HTN, CAD, MI, arthritis, CKD, diverticulosis, hyperlipidemia, anemia, anxiety:  . Patient is unable to independently self-manage chronic health conditions  Initial goal documentation      . Feelings of sadness/hopelessness related to chronic pain       CARE PLAN ENTRY (see longtitudinal plan of care for additional care plan information)  Feelings of hopelessness/sadness in patient with chronic pain related to post  herpetic neuralgia  Current Barriers:  Marland Kitchen Knowledge Deficits related to psychosocial support options . Chronic pain . Transportation . Doesn't want to be a burden to her family  Nurse Case Manager Clinical Goal(s):  Marland Kitchen Over the next 30 days, patient will talk with LCSW about psychosocial issues related to chronic pain  Interventions:  . Chart reviewed . Talked with patient by telephone . Completed PHQ-9 . Discussed referral to embedded LCSW for counseling . Spent time talking with patient about her feelings, concerns, and the impact it has on her daily activities  . Discussed family/community support . Encouraged patient to reach out to [facility] as needed . Encouraged patient to seek emergency medical care as needed  Patient Self Care Activities:  . Performs ADL's independently . Does not drive and depends on family for transportation  Initial goal documentation        Ms. Gethers was given information about Chronic Care Management services today including:  1. CCM service includes personalized support from designated clinical staff supervised by her physician, including individualized plan of care and coordination with other care providers 2. 24/7 contact phone numbers for assistance for urgent and routine care needs. 3. Service will only be billed when office clinical staff spend 20 minutes or more in a month to coordinate care. 4. Only one practitioner may furnish and bill the service in a calendar month. 5. The patient may stop CCM services at any time (effective at the end of the month) by phone call to the office staff. 6. The patient will be responsible for cost sharing (co-pay) of up to 20% of the service fee (after annual deductible is met).  Patient agreed to services  and verbal consent obtained.   The patient verbalized understanding of instructions provided today and declined a print copy of patient instruction materials.   The care management team will  reach out to the patient again over the next 30 days.   [name], BSN, RN-BC Embedded Chronic Care Manager [facility] Direct Dial: [phone]

## 2019-08-22 NOTE — Chronic Care Management (AMB) (Signed)
Chronic Care Management   Initial Visit Note  08/22/2019 Name: Shelly [name] MRN: [id number] DOB: [birth year]  Referred by: [name], [name], MD Reason for referral : Chronic Care Management (RN Initial Visit (3rd attempt))   Shelly [name] is a 79 y.o. year old female who is a primary care patient of [name], [name], MD. The CCM team was consulted for assistance with chronic disease management and care coordination needs related to HTN, CAD, MI, arthritis, CKD, diverticulosis, hyperlipidemia, anemia, anxiety  Review of patient status, including review of consultants reports, relevant laboratory and other test results, and collaboration with appropriate care team members and the patient's provider was performed as part of comprehensive patient evaluation and provision of chronic care management services.    SDOH (Social Determinants of Health) assessments performed: Yes See Care Plan activities for detailed interventions related to SDOH  SDOH Interventions     Most Recent Value  SDOH Interventions  Depression Interventions/Treatment   Counseling: [Referral to embedded LCSW]       Medications: Outpatient Encounter Medications as of 08/22/2019  Medication Sig  . b complex vitamins capsule Take 1 capsule by mouth daily.  . Capsaicin 0.1 % CREA Apply 1 application topically 4 (four) times daily as needed.  . cetirizine (ZYRTEC) 10 MG tablet Take 10 mg by mouth daily.  . chlorthalidone (HYGROTON) 25 MG tablet Take 1 tablet (25 mg total) by mouth daily.  . clopidogrel (PLAVIX) 75 MG tablet TAKE ONE TABLET BY MOUTH DAILY  . clotrimazole-betamethasone (LOTRISONE) cream APPLY AS DIRECTED TWICE DAILY.  . furosemide (LASIX) 40 MG tablet Take 1 tablet (40 mg total) by mouth as needed for edema (weight gain).  Marland Kitchen lisinopril (ZESTRIL) 40 MG tablet Take 1 tablet (40 mg total) by mouth daily.  . methimazole (TAPAZOLE) 5 MG tablet Take 1 tablet (5 mg total) by mouth daily.  . metoprolol  succinate (TOPROL XL) 25 MG 24 hr tablet Take 1 tablet (25 mg total) by mouth daily.  . Multiple Vitamin (MULTIVITAMIN) tablet Take 1 tablet by mouth daily.  . pantoprazole (PROTONIX) 40 MG tablet Take 1 tablet (40 mg total) by mouth daily. 30 minutes before breakfast  . pregabalin (LYRICA) 100 MG capsule Take 1 capsule (100 mg total) by mouth at bedtime as needed. Patient not sure of strength  . rosuvastatin (CRESTOR) 5 MG tablet Take 1 tablet (5 mg total) by mouth daily.   No facility-administered encounter medications on file as of 08/22/2019.     RN Care Plan   . Chronic Disease Management Needs       CARE PLAN ENTRY (see longtitudinal plan of care for additional care plan information)  Current Barriers:  . Chronic Disease Management support, education, and care coordination needs related to HTN, CAD, MI, arthritis, CKD, diverticulosis, hyperlipidemia, anemia, anxiety  Clinical Goal(s) related to HTN, CAD, MI, arthritis, CKD, diverticulosis, hyperlipidemia, anemia, anxiety:  Over the next 45 days, patient will:  . Work with the care management team to address educational, disease management, and care coordination needs  . Call provider office for new or worsened signs and symptoms of chronic medical conditions . Call care management team with questions or concerns . Verbalize basic understanding of patient centered plan of care established today  Interventions related to HTN, CAD, MI, arthritis, CKD, diverticulosis, hyperlipidemia, anemia, anxiety:  . Evaluation of current treatment plans and patient's adherence to plan as established by provider . Assessed patient understanding of disease states . Assessed patient's education and  care coordination needs . Provided disease specific education to patient  . Collaborated with appropriate clinical care team members regarding patient needs . Reviewed and discussed medications and their roles in her health . Encouraged patient to reach out  to [facility] as needed  Patient Self Care Activities related to HTN, CAD, MI, arthritis, CKD, diverticulosis, hyperlipidemia, anemia, anxiety:  . Patient is unable to independently self-manage chronic health conditions  Initial goal documentation      . Feelings of sadness/hopelessness related to chronic pain       CARE PLAN ENTRY (see longtitudinal plan of care for additional care plan information)  Feelings of hopelessness/sadness in patient with chronic pain related to post herpetic neuralgia  Current Barriers:  Marland Kitchen Knowledge Deficits related to psychosocial support options . Chronic pain . Transportation . Doesn't want to be a burden to her family  Nurse Case Manager Clinical Goal(s):  Marland Kitchen Over the next 30 days, patient will talk with LCSW about psychosocial issues related to chronic pain  Interventions:  . Chart reviewed . Talked with patient by telephone . Completed PHQ-9 . Discussed referral to embedded LCSW for counseling . Spent time talking with patient about her feelings, concerns, and the impact it has on her daily activities  . Discussed family/community support . Encouraged patient to reach out to [facility] as needed . Encouraged patient to seek emergency medical care as needed  Patient Self Care Activities:  . Performs ADL's independently . Does not drive and depends on family for transportation  Initial goal documentation         Follow-up Plan:   The care management team will reach out to the patient again over the next 30 days.   [name], BSN, RN-BC Embedded Chronic Care Manager [facility] Direct Dial: [phone]

## 2019-08-26 ENCOUNTER — Ambulatory Visit: Payer: Self-pay | Admitting: Licensed Clinical Social Worker

## 2019-08-26 DIAGNOSIS — N1832 Chronic kidney disease, stage 3b: Secondary | ICD-10-CM

## 2019-08-26 DIAGNOSIS — I252 Old myocardial infarction: Secondary | ICD-10-CM

## 2019-08-26 DIAGNOSIS — I10 Essential (primary) hypertension: Secondary | ICD-10-CM

## 2019-08-26 DIAGNOSIS — I2510 Atherosclerotic heart disease of native coronary artery without angina pectoris: Secondary | ICD-10-CM

## 2019-08-26 DIAGNOSIS — E782 Mixed hyperlipidemia: Secondary | ICD-10-CM

## 2019-08-26 DIAGNOSIS — F419 Anxiety disorder, unspecified: Secondary | ICD-10-CM

## 2019-08-26 NOTE — Patient Instructions (Addendum)
Licensed Clinical Social Worker Visit Information   I reached out to [employer] by phone today. LCSW made several phone call attempts to speak with client today but was unable to reach client via phone. LCSW did leave phone message for client asking her to please return call to LCSW at [phone].  Materials Provided: No  Follow Up Plan: LCSW to call client in next 4 weeks to talk with client about CCM program services  LCSW was not able to speak via phone with client today; thus, client was not able to verbalize understanding of instructions provided today and was not able to accept or decline a print copy of patient instruction materials.   Norva Riffle.Taji Barretto MSW, LCSW Licensed Clinical Social Worker [facility] Care [facility] [phone]

## 2019-08-26 NOTE — Chronic Care Management (AMB) (Signed)
  Care Management Note   Shelly [name] is a 79 y.o. year old female who is a primary care patient of Shelly [name], Shelly Kaufmann, MD. The CM team was consulted for assistance with chronic disease management and care coordination.   I reached out to Shelly [name] by phone today. LCSW made several phone call attempts to speak with client today but was unable to reach client via phone. LCSW did leave phone message for client asking her to please return call to LCSW at [phone].  Review of patient status, including review of consultants reports, relevant laboratory and other test results, and collaboration with appropriate care team members and the patient's provider was performed as part of comprehensive patient evaluation and provision of chronic care management services.   Medications   (very important)  New medications from outside sources are available for reconciliation  b complex vitamins capsule Capsaicin 0.1 % CREA cetirizine (ZYRTEC) 10 MG tablet chlorthalidone (HYGROTON) 25 MG tablet clopidogrel (PLAVIX) 75 MG tablet clotrimazole-betamethasone (LOTRISONE) cream furosemide (LASIX) 40 MG tablet lisinopril (ZESTRIL) 40 MG tablet methimazole (TAPAZOLE) 5 MG tablet metoprolol succinate (TOPROL XL) 25 MG 24 hr tablet Multiple Vitamin (MULTIVITAMIN) tablet pantoprazole (PROTONIX) 40 MG tablet pregabalin (LYRICA) 100 MG capsule rosuvastatin (CRESTOR) 5 MG tablet(Expired)  Follow Up Plan: LCSW to call client in next 4 weeks to talk with client about CCM program services  Shelly [name].Shelly [name] MSW, LCSW Licensed Clinical Social Worker [facility] [phone]

## 2019-08-29 ENCOUNTER — Ambulatory Visit: Payer: Self-pay | Admitting: Licensed Clinical Social Worker

## 2019-08-29 ENCOUNTER — Other Ambulatory Visit: Payer: Self-pay | Admitting: Cardiovascular Disease

## 2019-08-29 DIAGNOSIS — I2510 Atherosclerotic heart disease of native coronary artery without angina pectoris: Secondary | ICD-10-CM

## 2019-08-29 DIAGNOSIS — M1712 Unilateral primary osteoarthritis, left knee: Secondary | ICD-10-CM

## 2019-08-29 DIAGNOSIS — E782 Mixed hyperlipidemia: Secondary | ICD-10-CM

## 2019-08-29 DIAGNOSIS — N1832 Chronic kidney disease, stage 3b: Secondary | ICD-10-CM

## 2019-08-29 DIAGNOSIS — K219 Gastro-esophageal reflux disease without esophagitis: Secondary | ICD-10-CM

## 2019-08-29 DIAGNOSIS — I252 Old myocardial infarction: Secondary | ICD-10-CM

## 2019-08-29 DIAGNOSIS — I10 Essential (primary) hypertension: Secondary | ICD-10-CM

## 2019-08-29 DIAGNOSIS — F419 Anxiety disorder, unspecified: Secondary | ICD-10-CM

## 2019-08-29 NOTE — Chronic Care Management (AMB) (Signed)
  Care Management Note   Shelly [name] is a 79 y.o. year old female who is a primary care patient of [name], [name], MD. The CM team was consulted for assistance with chronic disease management and care coordination.   I reached out to [name] by phone today.   Review of patient status, including review of consultants reports, relevant laboratory and other test results, and collaboration with appropriate care team members and the patient's provider was performed as part of comprehensive patient evaluation and provision of chronic care management services.   Social determinants of health: risk of social isolation; risk of depression risk of physical inactivity    Chronic Care Management from 08/29/2019 in [place]  PHQ-9 Total Score  8     GAD 7 : Generalized Anxiety Score 08/29/2019  Nervous, Anxious, on Edge 1  Control/stop worrying 1  Worry too much - different things 1  Trouble relaxing 0  Restless 0  Easily annoyed or irritable 0  Afraid - awful might happen 0  Total GAD 7 Score 3  Anxiety Difficulty Somewhat difficult   Medications   (very important)  New medications from outside sources are available for reconciliation  b complex vitamins capsule Capsaicin 0.1 % CREA cetirizine (ZYRTEC) 10 MG tablet chlorthalidone (HYGROTON) 25 MG tablet clopidogrel (PLAVIX) 75 MG tablet clotrimazole-betamethasone (LOTRISONE) cream furosemide (LASIX) 40 MG tablet lisinopril (ZESTRIL) 40 MG tablet methimazole (TAPAZOLE) 5 MG tablet metoprolol succinate (TOPROL XL) 25 MG 24 hr tablet Multiple Vitamin (MULTIVITAMIN) tablet pantoprazole (PROTONIX) 40 MG tablet pregabalin (LYRICA) 100 MG capsule rosuvastatin (CRESTOR) 5 MG tablet(Expired)  Goals Addressed            This Visit's Progress   . Client will talk with LCSW in next 30 days about depression sympotms related to health conditions faced (pt-stated)       CARE PLAN ENTRY (see  longtitudinal plan of care for additional care plan information)  Current Barriers:  . Decreased appetite . Mobility issues . Grief issues faced in client with Chronic Diagnoses of OA, CKD, CAD, Anxiety, GERD, HTN  Clinical Social Work Clinical Goal(s):  Marland Kitchen LCSW will talk with client in next 30 days to discuss depression symptoms of client related to her health conditions  Interventions: . Talked with client about CCM services . Talked with client about her upcoming medical appointments . Provided counseling support for client (she experienced the death of one daughter one year ago and experienced death of another daughter 4 years ago) . Encouraged client to talk with RNCM as needed to address nursing issues she faced . Talked with client about client decreased energy . Talked with client about grief support resources through [facility] . Client and LCSW talked about client's pain issues related to shingles issues she faces  Patient Self Care Activities:   Takes medications as prescribed Attends scheduled medical appointments  . Patient self care deficits: Marland Kitchen Mobility issues  Initial goal documentation       Follow Up Plan:  LCSW to call client in next 4 weeks to talk with client about depression symptoms faced by client  Norva Riffle.Rikia Sukhu MSW, LCSW Licensed Clinical Social Worker [facility] [phone]

## 2019-08-29 NOTE — Patient Instructions (Addendum)
Licensed Clinical Social Worker Visit Information  Goals we discussed today:  Goals Addressed            This Visit's Progress   . Client will talk with LCSW in next 30 days about depression sympotms related to health conditions faced (pt-stated)       CARE PLAN ENTRY (see longtitudinal plan of care for additional care plan information)  Current Barriers:  . Decreased appetite . Mobility issues . Grief issues faced  Clinical Social Work Clinical Goal(s):  Marland Kitchen LCSW will talk with client in next 30 days to discuss depression symptoms of client related to her health conditions  Interventions: . Talked with client about CCM services . Talked with client about her upcoming medical appointments . Provided counseling support for client (she experienced the death of one daughter one year ago and experienced death of another daughter 4 years ago) . Encouraged client to talk with RNCM as needed to address nursing issues she faced . Talked with client about client decreased energy . Talked with client about grief support resources through [facility] . Client and LCSW talked about client's pain issues related to shingles issues she faces  Patient Self Care Activities:   Takes medications as prescribed Attends scheduled medical appointments  . Patient self care deficits: Marland Kitchen Mobility issues   Initial goal documentation        Materials Provided: No  Follow Up Plan:  LCSW to call client in next 4 weeks to talk with client about depression symptoms faced by client  The patient verbalized understanding of instructions provided today and declined a print copy of patient instruction materials.   Norva Riffle.Liliya Fullenwider MSW, LCSW Licensed Clinical Social Worker [facility] [phone]

## 2019-08-30 ENCOUNTER — Telehealth: Payer: Self-pay | Admitting: Family Medicine

## 2019-08-30 NOTE — Telephone Encounter (Signed)
  Prescription Request  08/30/2019  What is the name of the medication or equipment? Walker  Have you contacted your pharmacy to request a refill? (if applicable) No  Which pharmacy would you like this sent to? [facility]   Patient notified that their request is being sent to the clinical staff for review and that they should receive a response within 2 business days.   [name]'s pt  Pt wants a RX for a Walker.

## 2019-08-31 ENCOUNTER — Ambulatory Visit: Payer: Medicare Other | Admitting: Gastroenterology

## 2019-08-31 ENCOUNTER — Ambulatory Visit (INDEPENDENT_AMBULATORY_CARE_PROVIDER_SITE_OTHER): Payer: Medicare Other

## 2019-08-31 ENCOUNTER — Other Ambulatory Visit: Payer: Self-pay

## 2019-08-31 ENCOUNTER — Encounter: Payer: Self-pay | Admitting: Gastroenterology

## 2019-08-31 VITALS — BP 174/73 | HR 76 | Temp 97.0°F | Ht 64.0 in | Wt 256.0 lb

## 2019-08-31 DIAGNOSIS — R600 Localized edema: Secondary | ICD-10-CM

## 2019-08-31 DIAGNOSIS — K219 Gastro-esophageal reflux disease without esophagitis: Secondary | ICD-10-CM

## 2019-08-31 NOTE — Progress Notes (Signed)
Referring Provider: [name], [name], MD Primary Care Physician:  [name], [name], MD Primary GI: Dr. Oneida Alar    Chief Complaint  Patient presents with  . Gastroesophageal Reflux    f/u. Okay sometimes and other times will have a problem but not bad  . Anemia    f/u    HPI:   Shelly [name] is a 79 y.o. female presenting today with a history of diverticular bleed several years ago, IDA with capsule study showing enteritis due to aspirin use, chronic GERD.Prefers frequent visits with health care team. Hgb in Jan 2021 10.2, similar to prior. Ferritin normal. Will recheck in July 2021.    Chronic GERD, taking Protonix just as needed. Still with some flares. Not taking daily. Not the best appetite. Denies nausea, abdominal pain, rectal bleeding. No iron daily. Denying constipation, stating cheerios help. Went to [facility] due to postherpetic neuralgia. Left knee pain chronic. No other GI concerns. Desires to be seen every 3 months.   Past Medical History:  Diagnosis Date  . Allergy   . Anemia    GI bleed  . Anginal pain ([place])   . Anxiety   . Arthritis   . Blood transfusion without reported diagnosis   . Cataract   . Chronic kidney disease   . Coronary artery disease    SEVERE  . GERD (gastroesophageal reflux disease)   . GI bleed 12/2013  . Goiter   . Hyperlipidemia   . Hypertension   . Myocardial infarct ([place])   . Myocardial infarction ([place]) 08/2013  . Neuralgia of right lower extremity 07/13/2017  . Pancolonic diverticulosis 12/2013  . Post herpetic neuralgia     Past Surgical History:  Procedure Laterality Date  . CARPAL TUNNEL RELEASE Bilateral   . COLONOSCOPY N/A 01/13/2014   Dr. Hung:pandiverticulosis   . COLONOSCOPY N/A 12/14/2015   Dr. Michail Sermon: pancolonic diverticulosis, internal hemorrhoids   . CORNEAL TRANSPLANT    . CORONARY ANGIOPLASTY  08/2013  . ESOPHAGOGASTRODUODENOSCOPY N/A 12/14/2015   Dr. Michail Sermon: normal   . GIVENS CAPSULE STUDY N/A  08/13/2016   Dr. Oneida Alar: enteritis due to aspirin.   Marland Kitchen HEEL SPUR EXCISION    . LEFT HEART CATHETERIZATION WITH CORONARY ANGIOGRAM N/A 08/23/2013   Procedure: LEFT HEART CATHETERIZATION WITH CORONARY ANGIOGRAM;  Surgeon: Peter M Martinique, MD;  Location: [facility] CATH LAB;  Service: Cardiovascular;  Laterality: N/A;  . PERCUTANEOUS CORONARY STENT INTERVENTION (PCI-S) N/A 08/24/2013   Procedure: PERCUTANEOUS CORONARY STENT INTERVENTION (PCI-S);  Surgeon: Peter M Martinique, MD;  Location: [facility] CATH LAB;  Service: Cardiovascular;  Laterality: N/A;  . TONSILLECTOMY    . TUBAL LIGATION      Current Outpatient Medications  Medication Sig Dispense Refill  . b complex vitamins capsule Take 1 capsule by mouth daily.    . Capsaicin 0.1 % CREA Apply 1 application topically 4 (four) times daily as needed. 60 g 5  . cetirizine (ZYRTEC) 10 MG tablet Take 10 mg by mouth daily.    . chlorthalidone (HYGROTON) 25 MG tablet Take 1 tablet (25 mg total) by mouth daily. 30 tablet 1  . clopidogrel (PLAVIX) 75 MG tablet TAKE ONE TABLET BY MOUTH DAILY 90 tablet 3  . clotrimazole-betamethasone (LOTRISONE) cream APPLY AS DIRECTED TWICE DAILY. 45 g 1  . furosemide (LASIX) 40 MG tablet Take 1 tablet (40 mg total) by mouth as needed for edema (weight gain). 30 tablet 6  . lisinopril (ZESTRIL) 40 MG tablet Take 1 tablet (  40 mg total) by mouth daily. 90 tablet 3  . methimazole (TAPAZOLE) 5 MG tablet Take 1 tablet (5 mg total) by mouth daily. 30 tablet 3  . metoprolol succinate (TOPROL XL) 25 MG 24 hr tablet Take 1 tablet (25 mg total) by mouth daily. 90 tablet 3  . Multiple Vitamin (MULTIVITAMIN) tablet Take 1 tablet by mouth daily.    . pantoprazole (PROTONIX) 40 MG tablet Take 1 tablet (40 mg total) by mouth daily. 30 minutes before breakfast 90 tablet 3  . pregabalin (LYRICA) 100 MG capsule Take 1 capsule (100 mg total) by mouth at bedtime as needed. Patient not sure of strength 30 capsule 5  . rosuvastatin (CRESTOR) 5 MG tablet TAKE ONE  TABLET BY MOUTH DAILY. 90 tablet 3   No current facility-administered medications for this visit.    Allergies as of 08/31/2019 - Review Complete 08/31/2019  Allergen Reaction Noted  . Nsaids Other (See Comments) 07/25/2016  . Atorvastatin Other (See Comments) 11/30/2018    Family History  Problem Relation Age of Onset  . Hypertension Brother   . Transient ischemic attack Brother   . Cancer Mother        unsure of type   . Other Father        grangrene   . Asthma Sister   . Stroke Sister   . COPD Sister   . Stroke Sister   . Early death Brother   . Early death Brother   . Liver cancer Daughter   . Cancer Daughter   . Hernia Son        Umbilical  . GI problems Son   . Pancreatic disease Daughter        pancreatectomy  . Cancer Daughter   . Diverticulitis Daughter   . Arthritis Daughter        knee   . Arthritis Daughter        shoulder   . Anemia Daughter   . GER disease Son     Social History   Socioeconomic History  . Marital status: Divorced    Spouse name: Not on file  . Number of children: 8  . Years of education: 48  . Highest education level: 11th grade  Occupational History  . 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 Smoking status: Never Smoker  . Smokeless tobacco: Never Used  Substance and Sexual Activity  . Alcohol use: No    Alcohol/week: 0.0 standard drinks  . Drug use: No  . Sexual activity: Not Currently    Birth control/protection: Surgical    Comment: divorced, lives with daughter and granddaughters  Other Topics Concern  . 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   No daily caffeine use.   Social Determinants of Health   Financial Resource Strain: Low Risk   . Difficulty of Paying Living Expenses: Not very hard  Food Insecurity: No Food Insecurity  . Worried About  Charity fundraiser in the Last Year: Never true  . Ran Out of Food in the Last Year: Never true  Transportation Needs: No Transportation Needs  . Lack of Transportation (Medical): No  . Lack of Transportation (Non-Medical): No  Physical Activity: Inactive  . Days of Exercise per Week: 0 days  . Minutes of  Exercise per Session: 0 min  Stress: No Stress Concern Present  . Feeling of Stress : Not at all  Social Connections: Slightly Isolated  . Frequency of Communication with Friends and Family: More than three times a week  . Frequency of Social Gatherings with Friends and Family: More than three times a week  . Attends Religious Services: More than 4 times per year  . Active Member of Clubs or Organizations: Yes  . Attends Archivist Meetings: More than 4 times per year  . Marital Status: Divorced    Review of Systems: Gen: Denies fever, chills, anorexia. Denies fatigue, weakness, weight loss.  CV: Denies chest pain, palpitations, syncope, peripheral edema, and claudication. Resp: Denies dyspnea at rest, cough, wheezing, coughing up blood, and pleurisy. GI: see HPI Derm: Denies rash, itching, dry skin Psych: Denies depression, anxiety, memory loss, confusion. No homicidal or suicidal ideation.  Heme: Denies bruising, bleeding, and enlarged lymph nodes.  Physical Exam: BP (!) 174/73   Pulse 76   Temp (!) 97 F (36.1 C) (Oral)   Ht 5\' 4"  (1.626 m)   Wt 256 lb (116.1 kg)   BMI 43.94 kg/m  General:   Alert and oriented. No distress noted. Pleasant and cooperative.  Head:  Normocephalic and atraumatic. Eyes:  Conjuctiva clear without scleral icterus. Mouth:  Mask in place Abdomen:  +BS, soft, non-tender and non-distended. No rebound or guarding. No HSM or masses noted. Msk:  Symmetrical without gross deformities. Normal posture. Extremities:  Without edema. Neurologic:  Alert and  oriented x4 Psych:  Alert and cooperative. Normal mood and  affect.  ASSESSMENT: TAYVIA FAUGHNAN is a 79 y.o. female presenting today with history of diverticular bleed several years ago, IDA with capsule study showing enteritis due to aspirin use, chronic GERD. Desiring 3 month follow-ups here and doing well currently.  IDA: resolved. No supplemental iron. Chronic normocytic anemia in setting of CKD. Serially following CBC, iron studies, with next due in July 2021.  GERD: do to flares, take once daily to every other day scheduled.    PLAN:  Protonix daily to every other day, 30 minutes before eating  CBC, iron studies in July 2021  Return in 3 months (at patient's request)  Annitta Needs, PhD, ANP-BC [facility]

## 2019-08-31 NOTE — Patient Instructions (Signed)
You can try Protonix every other day to daily, 30 minutes before breakfast.  We will see you in 3 months!   I enjoyed seeing you again today! As you know, I value our relationship and want to provide genuine, compassionate, and quality care. I welcome your feedback. If you receive a survey regarding your visit,  I greatly appreciate you taking time to fill this out. See you next time!  Annitta Needs, PhD, ANP-BC [facility]

## 2019-09-01 NOTE — Telephone Encounter (Signed)
appt made for video visit on 09/07/19

## 2019-09-05 ENCOUNTER — Telehealth: Payer: Self-pay | Admitting: Family Medicine

## 2019-09-05 ENCOUNTER — Telehealth: Payer: Self-pay | Admitting: *Deleted

## 2019-09-05 NOTE — Telephone Encounter (Signed)
Appt kept, alternative number was given

## 2019-09-05 NOTE — Telephone Encounter (Signed)
-----   Message from Herminio Commons, MD sent at 09/02/2019 10:02 AM EDT ----- Normal pumping function.

## 2019-09-05 NOTE — Telephone Encounter (Signed)
Shelly [name], [place]  5/68/6168 3:72 PM EDT    Patient notified. Copy to pcp.

## 2019-09-06 ENCOUNTER — Other Ambulatory Visit: Payer: Self-pay | Admitting: Family Medicine

## 2019-09-07 ENCOUNTER — Telehealth (INDEPENDENT_AMBULATORY_CARE_PROVIDER_SITE_OTHER): Payer: Medicare Other | Admitting: Family Medicine

## 2019-09-07 ENCOUNTER — Encounter: Payer: Self-pay | Admitting: Family Medicine

## 2019-09-07 DIAGNOSIS — M170 Bilateral primary osteoarthritis of knee: Secondary | ICD-10-CM | POA: Diagnosis not present

## 2019-09-07 DIAGNOSIS — R2689 Other abnormalities of gait and mobility: Secondary | ICD-10-CM | POA: Diagnosis not present

## 2019-09-07 DIAGNOSIS — W19XXXA Unspecified fall, initial encounter: Secondary | ICD-10-CM | POA: Diagnosis not present

## 2019-09-07 NOTE — Progress Notes (Signed)
Virtual Visit via My chart Video Note  I connected with Shelly [name] on 09/07/19 at 1415 by video and verified that I am speaking with the correct person using two identifiers. Shelly [name] is currently located at home and no other people are currently with her during visit. The provider, [name] Versie Soave, MD is located in their office at time of visit.  Call ended at 1430  I discussed the limitations, risks, security and privacy concerns of performing an evaluation and management service by video and the availability of in person appointments. I also discussed with the patient that there may be a patient responsible charge related to this service. The patient expressed understanding and agreed to proceed.   History and Present Illness: Patient is having more issues with her knees and osteoarthritis.  Her arthritis in her knees is worsening and she is more wobbly and fell once last week.  She hurt the bottom part of her knee. She is still able to walk on the knee but has gradually been more unstable. She denies any redness or warmth or swelling in her knees   No diagnosis found.  Outpatient Encounter Medications as of 09/07/2019  Medication Sig  . b complex vitamins capsule Take 1 capsule by mouth daily.  . Capsaicin 0.1 % CREA Apply 1 application topically 4 (four) times daily as needed.  . cetirizine (ZYRTEC) 10 MG tablet Take 10 mg by mouth daily.  . chlorthalidone (HYGROTON) 25 MG tablet Take 1 tablet (25 mg total) by mouth daily.  . clopidogrel (PLAVIX) 75 MG tablet TAKE ONE TABLET BY MOUTH DAILY  . clotrimazole-betamethasone (LOTRISONE) cream APPLY AS DIRECTED TWICE DAILY.  . furosemide (LASIX) 40 MG tablet Take 1 tablet (40 mg total) by mouth as needed for edema (weight gain).  Marland Kitchen lisinopril (ZESTRIL) 40 MG tablet Take 1 tablet (40 mg total) by mouth daily.  . methimazole (TAPAZOLE) 5 MG tablet Take 1 tablet (5 mg total) by mouth daily.  . metoprolol succinate (TOPROL  XL) 25 MG 24 hr tablet Take 1 tablet (25 mg total) by mouth daily.  . Multiple Vitamin (MULTIVITAMIN) tablet Take 1 tablet by mouth daily.  . pantoprazole (PROTONIX) 40 MG tablet Take 1 tablet (40 mg total) by mouth daily. 30 minutes before breakfast  . pregabalin (LYRICA) 100 MG capsule Take 1 capsule (100 mg total) by mouth at bedtime as needed. Patient not sure of strength  . rosuvastatin (CRESTOR) 5 MG tablet TAKE ONE TABLET BY MOUTH DAILY.   No facility-administered encounter medications on file as of 09/07/2019.    Review of Systems  Constitutional: Negative for chills and fever.  Eyes: Negative for visual disturbance.  Respiratory: Negative for chest tightness and shortness of breath.   Cardiovascular: Negative for chest pain and leg swelling.  Musculoskeletal: Positive for arthralgias and gait problem. Negative for back pain.  Skin: Negative for rash.  Neurological: Negative for light-headedness and headaches.  Psychiatric/Behavioral: Negative for agitation and behavioral problems.  All other systems reviewed and are negative.   Observations/Objective: Patient sounds comfortable and in no acute distress  Assessment and Plan: Problem List Items Addressed This Visit    None    Visit Diagnoses    Bilateral primary osteoarthritis of knee    -  Primary   Relevant Orders   For home use only DME 4 wheeled rolling walker with seat ([id number])   Fall, initial encounter       Relevant Orders   For home  use only DME 4 wheeled rolling walker with seat ([id number])   Balance disorder       Relevant Orders   For home use only DME 4 wheeled rolling walker with seat ([id number])    patient needs seated walker to help when she gets tired after walking short distances even in the grocery store   Follow up plan: Return in about 4 weeks (around 10/05/2019), or if symptoms worsen or fail to improve, for chronic medical issues.     I discussed the assessment and treatment plan with the  patient. The patient was provided an opportunity to ask questions and all were answered. The patient agreed with the plan and demonstrated an understanding of the instructions.   The patient was advised to call back or seek an in-person evaluation if the symptoms worsen or if the condition fails to improve as anticipated.  The above assessment and management plan was discussed with the patient. The patient verbalized understanding of and has agreed to the management plan. Patient is aware to call the clinic if symptoms persist or worsen. Patient is aware when to return to the clinic for a follow-up visit. Patient educated on when it is appropriate to go to the emergency department.    I provided 15 minutes of non-face-to-face time during this encounter.    [name], MD

## 2019-09-08 DIAGNOSIS — Z029 Encounter for administrative examinations, unspecified: Secondary | ICD-10-CM

## 2019-09-14 DIAGNOSIS — R2681 Unsteadiness on feet: Secondary | ICD-10-CM | POA: Diagnosis not present

## 2019-09-15 DIAGNOSIS — B0229 Other postherpetic nervous system involvement: Secondary | ICD-10-CM | POA: Diagnosis not present

## 2019-09-15 DIAGNOSIS — H9203 Otalgia, bilateral: Secondary | ICD-10-CM | POA: Diagnosis not present

## 2019-09-15 DIAGNOSIS — H9193 Unspecified hearing loss, bilateral: Secondary | ICD-10-CM | POA: Diagnosis not present

## 2019-09-19 ENCOUNTER — Ambulatory Visit (INDEPENDENT_AMBULATORY_CARE_PROVIDER_SITE_OTHER): Payer: Medicare Other | Admitting: Licensed Clinical Social Worker

## 2019-09-19 DIAGNOSIS — N1832 Chronic kidney disease, stage 3b: Secondary | ICD-10-CM

## 2019-09-19 DIAGNOSIS — I2510 Atherosclerotic heart disease of native coronary artery without angina pectoris: Secondary | ICD-10-CM

## 2019-09-19 DIAGNOSIS — M1712 Unilateral primary osteoarthritis, left knee: Secondary | ICD-10-CM

## 2019-09-19 DIAGNOSIS — F419 Anxiety disorder, unspecified: Secondary | ICD-10-CM

## 2019-09-19 DIAGNOSIS — I10 Essential (primary) hypertension: Secondary | ICD-10-CM

## 2019-09-19 DIAGNOSIS — K219 Gastro-esophageal reflux disease without esophagitis: Secondary | ICD-10-CM

## 2019-09-19 NOTE — Chronic Care Management (AMB) (Signed)
Chronic Care Management    Clinical Social Work Follow Up Note  09/19/2019 Name: Shelly [name] MRN: [id number] DOB: 1940/10/30  Shelly [name] is a 79 y.o. year old female who is a primary care patient of [name], [name], MD. The CCM team was consulted for assistance with [employer] .   Review of patient status, including review of consultants reports, other relevant assessments, and collaboration with appropriate care team members and the patient's provider was performed as part of comprehensive patient evaluation and provision of chronic care management services.    SDOH (Social Determinants of Health) assessments performed: Yes;risk of social isolation; risk of depression; risk of physical inactivity     Chronic Care Management from 08/29/2019 in [facility]  PHQ-9 Total Score  8     GAD 7 : Generalized Anxiety Score 08/29/2019  Nervous, Anxious, on Edge 1  Control/stop worrying 1  Worry too much - different things 1  Trouble relaxing 0  Restless 0  Easily annoyed or irritable 0  Afraid - awful might happen 0  Total GAD 7 Score 3  Anxiety Difficulty Somewhat difficult    Outpatient Encounter Medications as of 09/19/2019  Medication Sig  . b complex vitamins capsule Take 1 capsule by mouth daily.  . Capsaicin 0.1 % CREA Apply 1 application topically 4 (four) times daily as needed.  . cetirizine (ZYRTEC) 10 MG tablet Take 10 mg by mouth daily.  . chlorthalidone (HYGROTON) 25 MG tablet Take 1 tablet (25 mg total) by mouth daily.  . clopidogrel (PLAVIX) 75 MG tablet TAKE ONE TABLET BY MOUTH DAILY  . clotrimazole-betamethasone (LOTRISONE) cream APPLY AS DIRECTED TWICE DAILY.  . furosemide (LASIX) 40 MG tablet Take 1 tablet (40 mg total) by mouth as needed for edema (weight gain).  Marland Kitchen lisinopril (ZESTRIL) 40 MG tablet Take 1 tablet (40 mg total) by mouth daily.  . methimazole (TAPAZOLE) 5 MG tablet Take 1 tablet (5 mg total) by mouth daily.  .  metoprolol succinate (TOPROL XL) 25 MG 24 hr tablet Take 1 tablet (25 mg total) by mouth daily.  . Multiple Vitamin (MULTIVITAMIN) tablet Take 1 tablet by mouth daily.  . pantoprazole (PROTONIX) 40 MG tablet Take 1 tablet (40 mg total) by mouth daily. 30 minutes before breakfast  . pregabalin (LYRICA) 100 MG capsule Take 1 capsule (100 mg total) by mouth at bedtime as needed. Patient not sure of strength  . rosuvastatin (CRESTOR) 5 MG tablet TAKE ONE TABLET BY MOUTH DAILY.   No facility-administered encounter medications on file as of 09/19/2019.    Goals Addressed            This Visit's Progress   . Client will talk with LCSW in next 30 days about depression sympotms related to health conditions faced (pt-stated)       CARE PLAN ENTRY (see longtitudinal plan of care for additional care plan information)  Current Barriers:  . Decreased appetite . Mobility issues in client with Chronic diagnoses of HTN, GERD,Anxiety,CAD, CKD,OA . Grief issues faced  Clinical Social Work Clinical Goal(s):  Marland Kitchen LCSW will talk with client in next 30 days to discuss depression symptoms of client related to her health conditions  Interventions: . Talked with client about her upcoming medical appointments . Provided counseling support for client (she experienced the death of one daughter one year ago and experienced death of another daughter 4 years ago) . Encouraged client to talk with RNCM as needed to address nursing  issues she faced . Talked with client about grief support resources through [facility] . Client and LCSW talked about client's pain issues related to shingles issues she faces . Talked with client about her recent appointemnt with medical provider related to management of Shingles of client . Talked with client about mobility issues (she uses a rollator walker as needed) . Talked with client about social support network  (she has 2 granddaughters who reside with her and  are supportive) . Talked with client about meal provision for client  Patient Self Care Activities:   Takes medications as prescribed Attends scheduled medical appointments  . Patient self care deficits: Marland Kitchen Mobility issues  Initial goal documentation       Follow Up Plan:  LCSW to call client in next 4 weeks to talk with client about depression symptoms faced by client  Norva Riffle.Talaysia Pinheiro MSW, LCSW Licensed Clinical Social Worker [facility] [phone]

## 2019-09-19 NOTE — Patient Instructions (Addendum)
Licensed Clinical Social Worker Visit Information  Goals we discussed today:  Goals Addressed            This Visit's Progress   . Client will talk with LCSW in next 30 days about depression sympotms related to health conditions faced (pt-stated)       CARE PLAN ENTRY   Current Barriers:  . Decreased appetite . Mobility issues in client with Chronic diagnoses of HTN, GERD,Anxiety,CAD, CKD,OA . Grief issues faced  Clinical Social Work Clinical Goal(s):  Marland Kitchen LCSW will talk with client in next 30 days to discuss depression symptoms of client related to her health conditions  Interventions:  Talked with client about her upcoming medical appointments  Provided counseling support for client (she experienced the death of one daughter one year ago and experienced death of another daughter 4 years ago)  Encouraged client to talk with RNCM as needed to address nursing issues she faced  Talked with client about grief support resources through [facility]  Client and LCSW talked about client's pain issues related to shingles issues she faces  Talked with client about her recent appointemnt with medical provider related to management of Shingles of client  Talked with client about mobility issues (she uses a rollator walker as needed)  Talked with client about social support network  (she has 2 granddaughters who reside with her and are supportive)  Talked with client about meal provision for client  Patient Self Care Activities:   Takes medications as prescribed Attends scheduled medical appointments  . Patient self care deficits: Marland Kitchen Mobility issues  Initial goal documentation       Materials Provided: No  Follow Up Plan:LCSW to call client in next 4 weeks to talk with client about depression symptoms faced by client  The patient verbalized understanding of instructions provided today and declined a print copy of patient instruction materials.    Norva Riffle.Jahaan Vanwagner MSW, LCSW Licensed Clinical Social Worker [facility] [phone]

## 2019-09-20 DIAGNOSIS — R069 Unspecified abnormalities of breathing: Secondary | ICD-10-CM | POA: Diagnosis not present

## 2019-09-23 ENCOUNTER — Ambulatory Visit: Payer: Medicare Other | Admitting: "Endocrinology

## 2019-09-23 DIAGNOSIS — E0590 Thyrotoxicosis, unspecified without thyrotoxic crisis or storm: Secondary | ICD-10-CM | POA: Diagnosis not present

## 2019-09-24 LAB — T3, FREE: T3, Free: 2.7 pg/mL (ref 2.3–4.2)

## 2019-09-24 LAB — TSH: TSH: 0.28 mIU/L — ABNORMAL LOW (ref 0.40–4.50)

## 2019-09-24 LAB — T4, FREE: Free T4: 0.9 ng/dL (ref 0.8–1.8)

## 2019-09-27 ENCOUNTER — Encounter: Payer: Self-pay | Admitting: Neurology

## 2019-09-27 ENCOUNTER — Ambulatory Visit: Payer: Medicare Other | Admitting: Neurology

## 2019-09-28 ENCOUNTER — Encounter: Payer: Self-pay | Admitting: Family Medicine

## 2019-09-28 ENCOUNTER — Ambulatory Visit (INDEPENDENT_AMBULATORY_CARE_PROVIDER_SITE_OTHER): Payer: Medicare Other | Admitting: Family Medicine

## 2019-09-28 ENCOUNTER — Other Ambulatory Visit: Payer: Self-pay

## 2019-09-28 VITALS — BP 146/82 | HR 69 | Temp 97.7°F | Ht 64.0 in | Wt 254.0 lb

## 2019-09-28 DIAGNOSIS — F419 Anxiety disorder, unspecified: Secondary | ICD-10-CM

## 2019-09-28 DIAGNOSIS — F339 Major depressive disorder, recurrent, unspecified: Secondary | ICD-10-CM | POA: Diagnosis not present

## 2019-09-28 DIAGNOSIS — R55 Syncope and collapse: Secondary | ICD-10-CM

## 2019-09-28 MED ORDER — DULOXETINE HCL 30 MG PO CPEP: 30.0000 mg | ORAL_CAPSULE | Freq: Every day | ORAL | 1 refills | Status: DC

## 2019-09-28 NOTE — Progress Notes (Signed)
BP (!) 146/82   Pulse 69   Temp 97.7 F (36.5 C)   Ht 5\' 4"  (1.626 m)   Wt 254 lb (115.2 kg)   SpO2 97%   BMI 43.60 kg/m    Subjective:   Patient ID: Shelly [name], female    DOB: [birth year], 79 y.o.   MRN: [id number]  HPI: Shelly [name] is a 79 y.o. female presenting on 09/28/2019 for Medical Management of Chronic Issues, Hypertension, and Loss of Consciousness (Last Tues. ? if coming from new med Lyrica)   HPI Patient is coming in today describing a syncopal episode where she passed out, it was right after she increase the Lyrica to 3 a day and now she is gone back down to and she has not had that episode. She says it happened 2 nights ago and she felt nauseated and then she passed out on the couch and her granddaughter was there and called EMS for her and EMS came and checked her out and then sent follow-up with her neurologist.  Patient also complains of a lot of neuralgia pains from her previous shingles and she was started on Lyrica for this so she is doing fairly down and depressed recently as well and she has no motivation and no energy and she feels like she sleeps all the time and when she tries to get up to do something she does not have energy to do it. She says is been worsening over the past few months. . She had thoughts of being better off dead but no suicidal ideations. Depression screen [facility] 2/9 08/29/2019 08/22/2019 08/03/2019 06/15/2019 04/06/2019  Decreased Interest 1 2 0 0 0  Down, Depressed, Hopeless 1 2 1  0 0  PHQ - 2 Score 2 4 1  0 0  Altered sleeping 1 3 - - -  Tired, decreased energy 2 3 - - -  Change in appetite 1 1 - - -  Feeling bad or failure about yourself  0 1 - - -  Trouble concentrating 1 0 - - -  Moving slowly or fidgety/restless 1 0 - - -  Suicidal thoughts 0 1 - - -  PHQ-9 Score 8 13 - - -  Difficult doing work/chores Somewhat difficult Not difficult at all - - -  Some recent data might be hidden     Relevant past medical, surgical, family  and social history reviewed and updated as indicated. Interim medical history since our last visit reviewed. Allergies and medications reviewed and updated.  Review of Systems  Constitutional: Negative for chills and fever.  Eyes: Negative for visual disturbance.  Respiratory: Negative for chest tightness and shortness of breath.   Cardiovascular: Negative for chest pain and leg swelling.  Musculoskeletal: Negative for back pain and gait problem.  Skin: Negative for rash.  Neurological: Positive for numbness. Negative for weakness, light-headedness and headaches.  Psychiatric/Behavioral: Positive for dysphoric mood and sleep disturbance. Negative for agitation, behavioral problems, self-injury and suicidal ideas. The patient is nervous/anxious.   All other systems reviewed and are negative.   Per HPI unless specifically indicated above   Allergies as of 09/28/2019      Reactions   Nsaids Other (See Comments)   AVOID all NSAID due to history of GI bleeding   Atorvastatin Other (See Comments)   Joint & muscle pain      Medication List       Accurate as of Sep 28, 2019  3:07 PM. If you have any  questions, ask your nurse or doctor.        b complex vitamins capsule Take 1 capsule by mouth daily.   Capsaicin 0.1 % Crea Apply 1 application topically 4 (four) times daily as needed.   cetirizine 10 MG tablet Commonly known as: ZYRTEC Take 10 mg by mouth daily.   chlorthalidone 25 MG tablet Commonly known as: HYGROTON Take 1 tablet (25 mg total) by mouth daily.   clopidogrel 75 MG tablet Commonly known as: PLAVIX TAKE ONE TABLET BY MOUTH DAILY   clotrimazole-betamethasone cream Commonly known as: LOTRISONE APPLY AS DIRECTED TWICE DAILY.   DULoxetine 30 MG capsule Commonly known as: Cymbalta Take 1 capsule (30 mg total) by mouth daily. Started by: [name], MD   furosemide 40 MG tablet Commonly known as: Lasix Take 1 tablet (40 mg total) by mouth as needed  for edema (weight gain).   lisinopril 40 MG tablet Commonly known as: ZESTRIL Take 1 tablet (40 mg total) by mouth daily.   methimazole 5 MG tablet Commonly known as: TAPAZOLE Take 1 tablet (5 mg total) by mouth daily.   metoprolol succinate 25 MG 24 hr tablet Commonly known as: Toprol XL Take 1 tablet (25 mg total) by mouth daily.   multivitamin tablet Take 1 tablet by mouth daily.   pantoprazole 40 MG tablet Commonly known as: PROTONIX Take 1 tablet (40 mg total) by mouth daily. 30 minutes before breakfast   pregabalin 100 MG capsule Commonly known as: Lyrica Take 1 capsule (100 mg total) by mouth at bedtime as needed. Patient not sure of strength   rosuvastatin 5 MG tablet Commonly known as: CRESTOR TAKE ONE TABLET BY MOUTH DAILY.        Objective:   BP (!) 146/82   Pulse 69   Temp 97.7 F (36.5 C)   Ht 5\' 4"  (1.626 m)   Wt 254 lb (115.2 kg)   SpO2 97%   BMI 43.60 kg/m   Wt Readings from Last 3 Encounters:  09/28/19 254 lb (115.2 kg)  08/31/19 256 lb (116.1 kg)  07/22/19 255 lb (115.7 kg)    Physical Exam Vitals and nursing note reviewed.  Constitutional:      General: She is not in acute distress.    Appearance: She is well-developed. She is not diaphoretic.  Eyes:     Conjunctiva/sclera: Conjunctivae normal.  Cardiovascular:     Rate and Rhythm: Normal rate and regular rhythm.     Heart sounds: Normal heart sounds. No murmur.  Pulmonary:     Effort: Pulmonary effort is normal. No respiratory distress.     Breath sounds: Normal breath sounds. No wheezing.  Abdominal:     General: Abdomen is flat. Bowel sounds are normal. There is no distension.     Tenderness: There is no abdominal tenderness. There is no guarding or rebound.  Musculoskeletal:        General: No tenderness. Normal range of motion.  Skin:    General: Skin is warm and dry.     Findings: No rash.  Neurological:     Mental Status: She is alert and oriented to person, place, and  time.     Coordination: Coordination normal.  Psychiatric:        Behavior: Behavior normal.       Assessment & Plan:   Problem List Items Addressed This Visit      Other   Anxiety   Relevant Medications   DULoxetine (CYMBALTA) 30 MG capsule  Other Visit Diagnoses    Syncope, unspecified syncope type    -  Primary   Depression, recurrent ([place])       Relevant Medications   DULoxetine (CYMBALTA) 30 MG capsule      Sounds like a lot of her symptoms are coming from depression except may be the syncope, she follow-up with her neurologist for that. Recommended for her to start Cymbalta to help with depression Follow up plan: Return in about 4 weeks (around 10/26/2019), or if symptoms worsen or fail to improve, for Anxiety and depression recheck, starting Cymbalta.  Counseling provided for all of the vaccine components No orders of the defined types were placed in this encounter.   Caryl Pina, MD [facility] 09/28/2019, 3:07 PM

## 2019-10-03 ENCOUNTER — Ambulatory Visit (INDEPENDENT_AMBULATORY_CARE_PROVIDER_SITE_OTHER): Payer: Medicare Other | Admitting: "Endocrinology

## 2019-10-03 ENCOUNTER — Other Ambulatory Visit: Payer: Self-pay

## 2019-10-03 ENCOUNTER — Encounter: Payer: Self-pay | Admitting: "Endocrinology

## 2019-10-03 VITALS — BP 148/79 | HR 69 | Ht 64.0 in | Wt 255.4 lb

## 2019-10-03 DIAGNOSIS — E0590 Thyrotoxicosis, unspecified without thyrotoxic crisis or storm: Secondary | ICD-10-CM | POA: Diagnosis not present

## 2019-10-03 MED ORDER — METHIMAZOLE 5 MG PO TABS: 2.5000 mg | ORAL_TABLET | Freq: Every day | ORAL | 3 refills | Status: DC

## 2019-10-03 NOTE — Progress Notes (Signed)
10/03/2019, 4:37 PM                                     Endocrinology follow-up note    Subjective:    Patient ID: Shelly [name], female    DOB: October 28, 1940, PCP [name], [name], MD   Past Medical History:  Diagnosis Date  . Allergy   . Anemia    GI bleed  . Anginal pain ([place])   . Anxiety   . Arthritis   . Blood transfusion without reported diagnosis   . Cataract   . Chronic kidney disease   . Coronary artery disease    SEVERE  . GERD (gastroesophageal reflux disease)   . GI bleed 12/2013  . Goiter   . Hyperlipidemia   . Hypertension   . Myocardial infarct ([place])   . Myocardial infarction ([place]) 08/2013  . Neuralgia of right lower extremity 07/13/2017  . Pancolonic diverticulosis 12/2013  . Post herpetic neuralgia    Past Surgical History:  Procedure Laterality Date  . CARPAL TUNNEL RELEASE Bilateral   . COLONOSCOPY N/A 01/13/2014   Dr. Hung:pandiverticulosis   . COLONOSCOPY N/A 12/14/2015   Dr. Michail Sermon: pancolonic diverticulosis, internal hemorrhoids   . CORNEAL TRANSPLANT    . CORONARY ANGIOPLASTY  08/2013  . ESOPHAGOGASTRODUODENOSCOPY N/A 12/14/2015   Dr. Michail Sermon: normal   . GIVENS CAPSULE STUDY N/A 08/13/2016   Dr. Oneida Alar: enteritis due to aspirin.   Marland Kitchen HEEL SPUR EXCISION    . LEFT HEART CATHETERIZATION WITH CORONARY ANGIOGRAM N/A 08/23/2013   Procedure: LEFT HEART CATHETERIZATION WITH CORONARY ANGIOGRAM;  Surgeon: Peter M Martinique, MD;  Location: [facility] CATH LAB;  Service: Cardiovascular;  Laterality: N/A;  . PERCUTANEOUS CORONARY STENT INTERVENTION (PCI-S) N/A 08/24/2013   Procedure: PERCUTANEOUS CORONARY STENT INTERVENTION (PCI-S);  Surgeon: Peter M Martinique, MD;  Location: [facility] CATH LAB;  Service: Cardiovascular;  Laterality: N/A;  . TONSILLECTOMY    . TUBAL LIGATION     Social History   Socioeconomic History  . Marital status: Divorced    Spouse name: Not on file  . Number of children: 8  . Years of  education: 33  . Highest education level: 11th grade  Occupational History  . 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 Smoking status: Never Smoker  . Smokeless tobacco: Never Used  Substance and Sexual Activity  . Alcohol use: No    Alcohol/week: 0.0 standard drinks  . Drug use: No  . Sexual activity: Not Currently    Birth control/protection: Surgical    Comment: divorced, lives with daughter and granddaughters  Other Topics Concern  . 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   No daily caffeine use.   Social Determinants of Health   Financial Resource Strain: Low Risk   . Difficulty of Paying Living Expenses: Not very hard  Food Insecurity: No Food Insecurity  . Worried About Charity fundraiser in the Last Year: Never  true  . Ran Out of Food in the Last Year: Never true  Transportation Needs: No Transportation Needs  . Lack of Transportation (Medical): No  . Lack of Transportation (Non-Medical): No  Physical Activity: Inactive  . Days of Exercise per Week: 0 days  . Minutes of Exercise per Session: 0 min  Stress: No Stress Concern Present  . Feeling of Stress : Not at all  Social Connections: Slightly Isolated  . Frequency of Communication with Friends and Family: More than three times a week  . Frequency of Social Gatherings with Friends and Family: More than three times a week  . Attends Religious Services: More than 4 times per year  . Active Member of Clubs or Organizations: Yes  . Attends Archivist Meetings: More than 4 times per year  . Marital Status: Divorced   Family History  Problem Relation Age of Onset  . Hypertension Brother   . Transient ischemic attack Brother   . Cancer Mother        unsure of type   . Other Father        grangrene   . Asthma Sister   . Stroke Sister   .  COPD Sister   . Stroke Sister   . Early death Brother   . Early death Brother   . Liver cancer Daughter   . Cancer Daughter   . Hernia Son        Umbilical  . GI problems Son   . Pancreatic disease Daughter        pancreatectomy  . Cancer Daughter   . Diverticulitis Daughter   . Arthritis Daughter        knee   . Arthritis Daughter        shoulder   . Anemia Daughter   . GER disease Son    Outpatient Encounter Medications as of 10/03/2019  Medication Sig  . b complex vitamins capsule Take 1 capsule by mouth daily.  . Capsaicin 0.1 % CREA Apply 1 application topically 4 (four) times daily as needed.  . cetirizine (ZYRTEC) 10 MG tablet Take 10 mg by mouth daily.  . chlorthalidone (HYGROTON) 25 MG tablet Take 1 tablet (25 mg total) by mouth daily.  . clopidogrel (PLAVIX) 75 MG tablet TAKE ONE TABLET BY MOUTH DAILY  . clotrimazole-betamethasone (LOTRISONE) cream APPLY AS DIRECTED TWICE DAILY.  . DULoxetine (CYMBALTA) 30 MG capsule Take 1 capsule (30 mg total) by mouth daily.  . furosemide (LASIX) 40 MG tablet Take 1 tablet (40 mg total) by mouth as needed for edema (weight gain).  Marland Kitchen lisinopril (ZESTRIL) 40 MG tablet Take 1 tablet (40 mg total) by mouth daily.  . methimazole (TAPAZOLE) 5 MG tablet Take 0.5 tablets (2.5 mg total) by mouth daily.  . metoprolol succinate (TOPROL XL) 25 MG 24 hr tablet Take 1 tablet (25 mg total) by mouth daily.  . Multiple Vitamin (MULTIVITAMIN) tablet Take 1 tablet by mouth daily.  . pantoprazole (PROTONIX) 40 MG tablet Take 1 tablet (40 mg total) by mouth daily. 30 minutes before breakfast  . pregabalin (LYRICA) 100 MG capsule Take 1 capsule (100 mg total) by mouth at bedtime as needed. Patient not sure of strength  . rosuvastatin (CRESTOR) 5 MG tablet TAKE ONE TABLET BY MOUTH DAILY.  . [DISCONTINUED] methimazole (TAPAZOLE) 5 MG tablet Take 1 tablet (5 mg total) by mouth daily.   No facility-administered encounter medications on file as of 10/03/2019.    ALLERGIES: Allergies  Allergen  Reactions  . Nsaids Other (See Comments)    AVOID all NSAID due to history of GI bleeding  . Atorvastatin Other (See Comments)    Joint & muscle pain    VACCINATION STATUS: Immunization History  Administered Date(s) Administered  . Pneumococcal Conjugate-13 10/02/2016  . Pneumococcal Polysaccharide-23 08/25/2013  . Zoster Recombinat (Shingrix) 03/17/2018    HPI RIAN KOON is 79 y.o. female who is seen in follow-up after she was seen in consultation for subclinical hyperthyroidism.    PMD: [name], [name], MD.  Due to septic symptoms of thyrotoxicosis, she was started on low-dose methimazole during her last visit.  She continued to respond to treatment with improved thyroid function test before this visit.  See below.    -She has steady weight, denies palpitations, tremors, nor heat intolerance.     She denies any history of goiter, no family history of thyroid dysfunction or thyroid malignancy.  Her thyroid uptake and scan on June 14, 2019 showed 24-hour uptake of 10.3% which is normal.  There was asymmetric thyroid activity with generally increased uptake in the right lobe.  No focal hot or cold nodules identified.  Her medical history includes hypertension and hyperlipidemia on treatment.  Review of Systems Limited as above.  Objective:    BP (!) 148/79   Pulse 69   Ht 5\' 4"  (1.626 m)   Wt 255 lb 6.4 oz (115.8 kg)   BMI 43.84 kg/m   Wt Readings from Last 3 Encounters:  10/03/19 255 lb 6.4 oz (115.8 kg)  09/28/19 254 lb (115.2 kg)  08/31/19 256 lb (116.1 kg)    Physical Exam   CMP     Component Value Date/Time   NA 143 04/06/2019 1602   K 5.6 (H) 04/06/2019 1602   CL 109 (H) 04/06/2019 1602   CO2 21 04/06/2019 1602   GLUCOSE 93 04/06/2019 1602   GLUCOSE 92 12/21/2018 1547   BUN 26 04/06/2019 1602   CREATININE 1.54 (H) 04/06/2019 1602   CREATININE 1.31 (H) 12/21/2018 1547   CALCIUM 9.9 04/06/2019 1602    PROT 7.0 04/06/2019 1602   ALBUMIN 3.7 04/06/2019 1602   AST 11 04/06/2019 1602   ALT 8 04/06/2019 1602   ALKPHOS 95 04/06/2019 1602   BILITOT <0.2 04/06/2019 1602   GFRNONAA 32 (L) 04/06/2019 1602   GFRAA 37 (L) 04/06/2019 1602     Diabetic Labs (most recent): Lab Results  Component Value Date   HGBA1C 6.0 (H) 12/09/2015   HGBA1C 6.0 (H) 08/23/2013     Lipid Panel ( most recent) Lipid Panel     Component Value Date/Time   CHOL 168 04/06/2019 1602   TRIG 72 04/06/2019 1602   HDL 53 04/06/2019 1602   CHOLHDL 3.2 04/06/2019 1602   CHOLHDL 2.6 11/20/2016 0854   VLDL 11 11/20/2016 0854   LDLCALC 101 (H) 04/06/2019 1602   LABVLDL 14 04/06/2019 1602      Lab Results  Component Value Date   TSH 0.28 (L) 09/23/2019   TSH 0.13 (L) 05/30/2019   TSH 0.347 (L) 04/06/2019   TSH 0.324 (L) 12/09/2015   TSH 0.292 (L) 10/19/2013   FREET4 0.9 09/23/2019   FREET4 0.9 05/30/2019   FREET4 1.04 12/11/2015   FREET4 1.09 10/19/2013      Assessment & Plan:   1. Subclinical hyperthyroidism -Her previsit labs are consistent with improved thyroid profile.  Complications of untreated thyrotoxicosis were discussed in detail with the patient.  She has benefited from  low-dose methimazole intervention.  I discussed and lowered her dose to a level of 2.5 mg p.o. daily at breakfast.  We will have repeat thyroid function test and office visit in 3 months.   -Her pulse rate is controlled at 69, did not require beta-blockers.  - she is advised to maintain close follow up with [name], [name], MD for primary care needs.     - Time spent on this patient care encounter:  20 minutes of which 50% was spent in  counseling and the rest reviewing  her current and  previous labs / studies and medications  doses and developing a plan for long term care. Shelly [name]  participated in the discussions, expressed understanding, and voiced agreement with the above plans.  All questions were answered to  her satisfaction. she is encouraged to contact clinic should she have any questions or concerns prior to her return visit.  Follow up plan: Return in about 3 months (around 01/03/2020) for F/U with Pre-visit Labs.   Shelly Lloyd, MD [facility] [address] [place],  [address] Phone: [phone]  Fax: [phone]     10/03/2019, 4:37 PM  This note was partially dictated with voice recognition software. Similar sounding words can be transcribed inadequately or may not  be corrected upon review.

## 2019-10-13 ENCOUNTER — Ambulatory Visit: Payer: Medicare Other | Admitting: *Deleted

## 2019-10-13 ENCOUNTER — Telehealth: Payer: Self-pay | Admitting: *Deleted

## 2019-10-13 DIAGNOSIS — N1832 Chronic kidney disease, stage 3b: Secondary | ICD-10-CM

## 2019-10-13 DIAGNOSIS — I10 Essential (primary) hypertension: Secondary | ICD-10-CM | POA: Diagnosis not present

## 2019-10-13 DIAGNOSIS — M1712 Unilateral primary osteoarthritis, left knee: Secondary | ICD-10-CM

## 2019-10-13 DIAGNOSIS — I2510 Atherosclerotic heart disease of native coronary artery without angina pectoris: Secondary | ICD-10-CM

## 2019-10-13 NOTE — Telephone Encounter (Signed)
10/13/2019  Patient complains of bilateral lower extremity edema. She takes lasix 40mg  daily PRN for edema and has been taking it for at least the past 3 days but she can't remember exactly how long. She is urinating more than usual with the lasix and says that it has helped some with the swelling but there is no significant or lasting improvement. States that edema is about the same in the morning when she wakes up as it is in the evening. She is not weighing daily but does feel that she has gained weight because of the swelling. She had a normal echo on 08/31/19.   Advised patient to weigh and record weight each morning after urinating. I will forward to PCP for review and recommendation.   [name], BSN, RN-BC Embedded Chronic Care Manager [facility] [facility] Direct Dial: [phone]

## 2019-10-13 NOTE — Chronic Care Management (AMB) (Signed)
Chronic Care Management   Follow Up Note   10/13/2019 Name: CHARNIKA HERBST MRN: [id number] DOB: 07/24/1940  Referred by: [name], [name], MD Reason for referral : Chronic Care Management (RN follow up)   ASHAKI FROSCH is a 79 y.o. year old female who is a primary care patient of [name], [name], MD. The CCM team was consulted for assistance with chronic disease management and care coordination needs.    Review of patient status, including review of consultants reports, relevant laboratory and other test results, and collaboration with appropriate care team members and the patient's provider was performed as part of comprehensive patient evaluation and provision of chronic care management services.    I spoke with Ms Hefty by telephone today regarding management of her chronic medical conditions.   SDOH (Social Determinants of Health) assessments performed: No See Care Plan activities for detailed interventions related to [facility])     Outpatient Encounter Medications as of 10/13/2019  Medication Sig  . b complex vitamins capsule Take 1 capsule by mouth daily.  . Capsaicin 0.1 % CREA Apply 1 application topically 4 (four) times daily as needed.  . cetirizine (ZYRTEC) 10 MG tablet Take 10 mg by mouth daily.  . chlorthalidone (HYGROTON) 25 MG tablet Take 1 tablet (25 mg total) by mouth daily.  . clopidogrel (PLAVIX) 75 MG tablet TAKE ONE TABLET BY MOUTH DAILY  . clotrimazole-betamethasone (LOTRISONE) cream APPLY AS DIRECTED TWICE DAILY.  . DULoxetine (CYMBALTA) 30 MG capsule Take 1 capsule (30 mg total) by mouth daily.  . furosemide (LASIX) 40 MG tablet Take 1 tablet (40 mg total) by mouth as needed for edema (weight gain).  Marland Kitchen lisinopril (ZESTRIL) 40 MG tablet Take 1 tablet (40 mg total) by mouth daily.  . methimazole (TAPAZOLE) 5 MG tablet Take 0.5 tablets (2.5 mg total) by mouth daily.  . metoprolol succinate (TOPROL XL) 25 MG 24 hr tablet Take 1 tablet (25 mg total) by  mouth daily.  . Multiple Vitamin (MULTIVITAMIN) tablet Take 1 tablet by mouth daily.  . pantoprazole (PROTONIX) 40 MG tablet Take 1 tablet (40 mg total) by mouth daily. 30 minutes before breakfast  . pregabalin (LYRICA) 100 MG capsule Take 1 capsule (100 mg total) by mouth at bedtime as needed. Patient not sure of strength  . rosuvastatin (CRESTOR) 5 MG tablet TAKE ONE TABLET BY MOUTH DAILY.   No facility-administered encounter medications on file as of 10/13/2019.      RN Care Plan   . "I want to manage the fluid in my legs" (pt-stated)       CARE PLAN ENTRY (see longitudinal plan of care for additional care plan information)  Current Barriers:  . Care Coordination needs related to bilateral lower extremity edema in a patient with HTN, CAD, and CKD (disease states)  Nurse Case Manager Clinical Goal(s):  Marland Kitchen Over the next 30 days, patient will work with PCP to address needs related to bilateral lower extremity edema  Interventions:  . Inter-disciplinary care team collaboration (see longitudinal plan of care) . Chart reviewed including recent PCP and cardiology office notes, lab results, and imaging studies o Normal echo on 08/31/19 . Reviewed and discussed medications: Lasix 40mg - taking daily PRN . Discussed lower extremity edema o Lasix does help with some with swelling and she does urinate more when taking it  o She has been taking it for at least the past few days but isn't sure exactly how long o She is not weighing her  self daily but feels that she has gained weight. She has noticeable swelling in her lower legs . Assessed patient's access to scales and encouraged her to weight and record weight each morning after urinating . Call PCP or cardiologist if she gains > 3 lbs in 24 hours or >5 in one week . Encouraged patient to continue elevating legs when sitting . RN will collaborate with PCP since patient has been taking lasix daily for more than 3 days and is still experiencing  lower extremity edema  Patient Self Care Activities:  . Performs ADL's independently . Performs IADL's independently . Unable to independently drive  Initial goal documentation     . Feelings of sadness/hopelessness related to chronic pain       CARE PLAN ENTRY (see longtitudinal plan of care for additional care plan information)  Feelings of hopelessness/sadness in patient with chronic pain related to post herpetic neuralgia and grief   Current Barriers:  Marland Kitchen Knowledge Deficits related to psychosocial support options . Chronic pain . Transportation . Doesn't want to be a burden to her family  Nurse Case Manager Clinical Goal(s):  Marland Kitchen Over the next 30 days, patient will talk with LCSW about psychosocial issues related to chronic pain  Interventions:  . Chart reviewed including recent office notes and lab reports . Talked with patient by telephone . Reviewed and discussed medications: Cymbalta-patient is not taking. She feels sad often but doesn't think she needs medication to treat depression at this time o I also explained that Cymbalta may help with some types of chronic pain . Encouraged patient to talk with LCSW psychosocial concerns . Reviewed upcoming appointments: LCSW on 10/21/19 . RN will update PCP on patient's decision to not start Cymbalta . Encouraged patient to reach out to [facility] as needed . Encouraged patient to seek emergency medical care as needed  Patient Self Care Activities:  . Performs ADL's independently . Does not drive and depends on family for transportation  Please see past updates related to this goal by clicking on the "Past Updates" button in the selected goal          Plan:  The care management team will reach out to the patient again over the next 30 days.    [name], BSN, RN-BC Embedded Chronic Care Manager [facility] [place] [facility] Direct Dial: [phone]

## 2019-10-13 NOTE — Patient Instructions (Signed)
Visit Information  Goals Addressed            This Visit's Progress     Patient Stated   . "I want to manage the fluid in my legs" (pt-stated)       CARE PLAN ENTRY (see longitudinal plan of care for additional care plan information)  Current Barriers:  . Care Coordination needs related to bilateral lower extremity edema in a patient with HTN, CAD, and CKD (disease states)  Nurse Case Manager Clinical Goal(s):  Marland Kitchen Over the next 30 days, patient will work with PCP to address needs related to bilateral lower extremity edema  Interventions:  . Inter-disciplinary care team collaboration (see longitudinal plan of care) . Chart reviewed including recent PCP and cardiology office notes, lab results, and imaging studies o Normal echo on 08/31/19 . Reviewed and discussed medications: Lasix 40mg - taking daily PRN . Discussed lower extremity edema o Lasix does help with some with swelling and she does urinate more when taking it  o She has been taking it for at least the past few days but isn't sure exactly how long o She is not weighing her self daily but feels that she has gained weight. She has noticeable swelling in her lower legs . Assessed patient's access to scales and encouraged her to weight and record weight each morning after urinating . Call PCP or cardiologist if she gains > 3 lbs in 24 hours or >5 in one week . Encouraged patient to continue elevating legs when sitting . RN will collaborate with PCP since patient has been taking lasix daily for more than 3 days and is still experiencing lower extremity edema  Patient Self Care Activities:  . Performs ADL's independently . Performs IADL's independently . Unable to independently drive  Initial goal documentation       Other   . Feelings of sadness/hopelessness related to chronic pain       CARE PLAN ENTRY (see longtitudinal plan of care for additional care plan information)  Feelings of hopelessness/sadness in patient  with chronic pain related to post herpetic neuralgia and grief   Current Barriers:  Marland Kitchen Knowledge Deficits related to psychosocial support options . Chronic pain . Transportation . Doesn't want to be a burden to her family  Nurse Case Manager Clinical Goal(s):  Marland Kitchen Over the next 30 days, patient will talk with LCSW about psychosocial issues related to chronic pain  Interventions:  . Chart reviewed including recent office notes and lab reports . Talked with patient by telephone . Reviewed and discussed medications: Cymbalta-patient is not taking. She feels sad often but doesn't think she needs medication to treat depression at this time o I also explained that Cymbalta may help with some types of chronic pain . Encouraged patient to talk with LCSW psychosocial concerns . Reviewed upcoming appointments: LCSW on 10/21/19 . RN will update PCP on patient's decision to not start Cymbalta . Encouraged patient to reach out to [facility] as needed . Encouraged patient to seek emergency medical care as needed  Patient Self Care Activities:  . Performs ADL's independently . Does not drive and depends on family for transportation  Please see past updates related to this goal by clicking on the "Past Updates" button in the selected goal         The patient verbalized understanding of instructions provided today and declined a print copy of patient instruction materials.   Follow-up Plan The care management team will reach out to  the patient again over the next 30 days.   [name], BSN, RN-BC Embedded Chronic Care Manager [facility] [place] [facility] Direct Dial: [phone]

## 2019-10-13 NOTE — Telephone Encounter (Signed)
I recommend compression stockings bilateral for her, we can send these in if she would like or she can go buy them at the local pharmacy but I do recommend compression stockings that she wear them every day.

## 2019-10-14 NOTE — Telephone Encounter (Signed)
Patient has compression stockings and will start wearing them.

## 2019-10-18 ENCOUNTER — Other Ambulatory Visit: Payer: Self-pay | Admitting: *Deleted

## 2019-10-18 ENCOUNTER — Encounter: Payer: Self-pay | Admitting: *Deleted

## 2019-10-18 DIAGNOSIS — D649 Anemia, unspecified: Secondary | ICD-10-CM

## 2019-10-21 ENCOUNTER — Ambulatory Visit (INDEPENDENT_AMBULATORY_CARE_PROVIDER_SITE_OTHER): Payer: Medicare Other | Admitting: Licensed Clinical Social Worker

## 2019-10-21 ENCOUNTER — Telehealth: Payer: Self-pay | Admitting: Family Medicine

## 2019-10-21 DIAGNOSIS — N1832 Chronic kidney disease, stage 3b: Secondary | ICD-10-CM

## 2019-10-21 DIAGNOSIS — M1712 Unilateral primary osteoarthritis, left knee: Secondary | ICD-10-CM | POA: Diagnosis not present

## 2019-10-21 DIAGNOSIS — I10 Essential (primary) hypertension: Secondary | ICD-10-CM | POA: Diagnosis not present

## 2019-10-21 DIAGNOSIS — K219 Gastro-esophageal reflux disease without esophagitis: Secondary | ICD-10-CM

## 2019-10-21 DIAGNOSIS — I2510 Atherosclerotic heart disease of native coronary artery without angina pectoris: Secondary | ICD-10-CM | POA: Diagnosis not present

## 2019-10-21 DIAGNOSIS — F419 Anxiety disorder, unspecified: Secondary | ICD-10-CM

## 2019-10-21 NOTE — Patient Instructions (Addendum)
Licensed Clinical Social Worker Visit Information  Goals we discussed today:  Goals    . Client will talk with LCSW in next 30 days about depression sympotms related to health conditions faced (pt-stated)     CARE PLAN ENTRY  Current Barriers:  . Decreased appetite . Mobility issues in client with Chronic diagnoses of HTN, GERD,Anxiety,CAD, CKD,OA . Grief issues faced  Clinical Social Work Clinical Goal(s):  Marland Kitchen LCSW will talk with client in next 30 days to discuss depression symptoms of client related to her health conditions  Interventions:  Talked with client about CCM services  Talked with client about her upcoming medical appointments  Provided counseling support for client (she experienced the death of one daughter one year ago and experienced death of another daughter 4 years ago)  Encouraged client to talk with RNCM as needed to address nursing issues she faced  Talked with client about client decreased energy  Talked with client about grief support resources through [facility]  Client and LCSW talked about client's pain issues related to shingles issues she faces  Talked with client about relaxation techniques (likes to do puzzles, art drawing)  Talked with client about social support network (has support from her children and her grandchildren)  Talked with Abeer about mood of client  Patient Self Care Activities:   Takes medications as prescribed Attends scheduled medical appointments  . Patient self care deficits: Marland Kitchen Mobility issues   Initial goal documentation    Materials Provided: No  Follow Up Plan: LCSW to call client in next 4 weeks to talk with client about depression symptoms faced by client  The patient verbalized understanding of instructions provided today and declined a print copy of patient instruction materials.   Norva Riffle.Malaiah Viramontes MSW, LCSW Licensed Clinical Social Worker [facility]  [facility] [phone]

## 2019-10-21 NOTE — Chronic Care Management (AMB) (Signed)
Chronic Care Management    Clinical Social Work Follow Up Note  10/21/2019 Name: Shelly [name] MRN: [id number] DOB: August 18, 1940  Shelly [name] is a 79 y.o. year old female who is a primary care patient of [name], [name], MD. The CCM team was consulted for assistance with [employer] .   Review of patient status, including review of consultants reports, other relevant assessments, and collaboration with appropriate care team members and the patient's provider was performed as part of comprehensive patient evaluation and provision of chronic care management services.    SDOH (Social Determinants of Health) assessments performed: Yes;risk for social isolation; risk for depression; risk for physical inactivity    Office Visit from 09/28/2019 in [place]  PHQ-9 Total Score  10       GAD 7 : Generalized Anxiety Score 09/28/2019 08/29/2019  Nervous, Anxious, on Edge 1 1  Control/stop worrying 2 1  Worry too much - different things 2 1  Trouble relaxing 0 0  Restless 0 0  Easily annoyed or irritable 0 0  Afraid - awful might happen 0 0  Total GAD 7 Score 5 3  Anxiety Difficulty - Somewhat difficult    Outpatient Encounter Medications as of 10/21/2019  Medication Sig  . b complex vitamins capsule Take 1 capsule by mouth daily.  . Capsaicin 0.1 % CREA Apply 1 application topically 4 (four) times daily as needed.  . cetirizine (ZYRTEC) 10 MG tablet Take 10 mg by mouth daily.  . chlorthalidone (HYGROTON) 25 MG tablet Take 1 tablet (25 mg total) by mouth daily.  . clopidogrel (PLAVIX) 75 MG tablet TAKE ONE TABLET BY MOUTH DAILY  . clotrimazole-betamethasone (LOTRISONE) cream APPLY AS DIRECTED TWICE DAILY.  . DULoxetine (CYMBALTA) 30 MG capsule Take 1 capsule (30 mg total) by mouth daily.  . furosemide (LASIX) 40 MG tablet Take 1 tablet (40 mg total) by mouth as needed for edema (weight gain).  Marland Kitchen lisinopril (ZESTRIL) 40 MG tablet Take 1 tablet (40 mg  total) by mouth daily.  . methimazole (TAPAZOLE) 5 MG tablet Take 0.5 tablets (2.5 mg total) by mouth daily.  . metoprolol succinate (TOPROL XL) 25 MG 24 hr tablet Take 1 tablet (25 mg total) by mouth daily.  . Multiple Vitamin (MULTIVITAMIN) tablet Take 1 tablet by mouth daily.  . pantoprazole (PROTONIX) 40 MG tablet Take 1 tablet (40 mg total) by mouth daily. 30 minutes before breakfast  . pregabalin (LYRICA) 100 MG capsule Take 1 capsule (100 mg total) by mouth at bedtime as needed. Patient not sure of strength  . rosuvastatin (CRESTOR) 5 MG tablet TAKE ONE TABLET BY MOUTH DAILY.   No facility-administered encounter medications on file as of 10/21/2019.    Goals    . Client will talk with LCSW in next 30 days about depression sympotms related to health conditions faced (pt-stated)     CARE PLAN ENTRY   Current Barriers:  . Decreased appetite . Mobility issues in client with Chronic diagnoses of HTN, GERD,Anxiety,CAD, CKD,OA . Grief issues faced  Clinical Social Work Clinical Goal(s):  Marland Kitchen LCSW will talk with client in next 30 days to discuss depression symptoms of client related to her health conditions  Interventions: . Talked with client about CCM services . Talked with client about her upcoming medical appointments . Provided counseling support for client (she experienced the death of one daughter one year ago and experienced death of another daughter 4 years ago) . Encouraged client to  talk with RNCM as needed to address nursing issues she faced . Talked with client about client decreased energy . Talked with client about grief support resources through [facility] . Client and LCSW talked about client's pain issues related to shingles issues she faces . Talked with client about relaxation techniques (likes to do puzzles, art drawing) . Talked with client about social support network (has support from her children and her grandchildren) . Talked with Charlett Nose  about mood of client  Patient Self Care Activities:   Takes medications as prescribed Attends scheduled medical appointments  . Patient self care deficits: Marland Kitchen Mobility issues   Initial goal documentation     Follow Up Plan:LCSW to call client in next 4 weeks to talk with client about depression symptoms faced by client  Norva Riffle.Varonica Siharath MSW, LCSW Licensed Clinical Social Worker [facility] [phone]

## 2019-10-21 NOTE — Telephone Encounter (Signed)
Okay sounds good, we just started the Cymbalta, I was hoping that would help her might she is supposed to have a follow-up with me this coming week, please make sure she still has that follow-up.

## 2019-10-21 NOTE — Telephone Encounter (Signed)
Returned Well[employer] geologist call about patient - states he was concerned about patient since she seemed more depressed than normal.  States sometimes when he talks to patient she is depressed but today was more than normal.  Patient lost 2 daughters in a 4 year time and was depressed about it.  Also states that patient has been having edema in bilateral feet and ankles.  He talked with patient about doing hospice grief counseling but patient declined. States that patent has been taking all her medication and was having no suicidal or homicidal thoughts. Scott wanted to give Dr. Warrick Parisian this information and recommended possibly increasing medication?    I called patient to check in.  I explained that we were concerned about her and wanted to make sure she was okay.  Patient states she was fine that she just was having a bad day since she kept thinking about her daughters that she lost.  States that she has good and bad days since the passing of her children and today she was thinking about them more.  Patient kept saying "I will be okay".  I asked patient if she had any thoughts of hurting herself or anyone else.  Patient states no she would never do that, that she was fine just having a bad day.  Patient states she is above every hurting herself and stated how much she appreciated [place] checking in with her.  Patient also states that she feels that if her swelling in her bilateral feet and ankles went down she would feel better. There is no pain with the swelling but sometimes they are so swollen that she can not put her shoes on. States that she thinks she is "down" because of that.  She had shingles x 1 yr ago and states that she is still having "after effects".  She is on lyrica and the side effect is swelling.  She is concerned about that.  Also states that she has an apt with Dr. Brigitte Pulse on 02/07/20.  I did mention to patient if she thinks she needs a increase in her depression medication and she states no that  she does not want it increased.  She would just like help with the swelling.  Please review and advise

## 2019-10-24 NOTE — Telephone Encounter (Signed)
Patient has follow up appointment scheduled for 10/31/2019

## 2019-10-28 ENCOUNTER — Ambulatory Visit: Payer: Medicare Other | Admitting: *Deleted

## 2019-10-28 DIAGNOSIS — I10 Essential (primary) hypertension: Secondary | ICD-10-CM

## 2019-10-31 ENCOUNTER — Other Ambulatory Visit: Payer: Self-pay

## 2019-10-31 ENCOUNTER — Ambulatory Visit (INDEPENDENT_AMBULATORY_CARE_PROVIDER_SITE_OTHER): Payer: Medicare Other | Admitting: Family Medicine

## 2019-10-31 ENCOUNTER — Encounter: Payer: Self-pay | Admitting: Family Medicine

## 2019-10-31 VITALS — BP 142/76 | HR 73 | Temp 98.5°F | Ht 64.0 in | Wt 255.0 lb

## 2019-10-31 DIAGNOSIS — R7989 Other specified abnormal findings of blood chemistry: Secondary | ICD-10-CM | POA: Diagnosis not present

## 2019-10-31 DIAGNOSIS — F419 Anxiety disorder, unspecified: Secondary | ICD-10-CM

## 2019-10-31 DIAGNOSIS — I10 Essential (primary) hypertension: Secondary | ICD-10-CM | POA: Diagnosis not present

## 2019-10-31 DIAGNOSIS — N1832 Chronic kidney disease, stage 3b: Secondary | ICD-10-CM

## 2019-10-31 DIAGNOSIS — E0590 Thyrotoxicosis, unspecified without thyrotoxic crisis or storm: Secondary | ICD-10-CM

## 2019-10-31 DIAGNOSIS — B0229 Other postherpetic nervous system involvement: Secondary | ICD-10-CM | POA: Diagnosis not present

## 2019-10-31 DIAGNOSIS — F339 Major depressive disorder, recurrent, unspecified: Secondary | ICD-10-CM

## 2019-10-31 MED ORDER — DULOXETINE HCL 30 MG PO CPEP: 30.0000 mg | ORAL_CAPSULE | Freq: Every day | ORAL | 2 refills | Status: DC

## 2019-10-31 NOTE — Progress Notes (Signed)
BP (!) 142/76   Pulse 73   Temp 98.5 F (36.9 C)   Ht '5\' 4"'$  (1.626 m)   Wt 255 lb (115.7 kg)   BMI 43.77 kg/m    Subjective:   Patient ID: Shelly [name], female    DOB: 10/15/1940, 79 y.o.   MRN: [id number]  HPI: Shelly [name] is a 79 y.o. female presenting on 10/31/2019 for Medical Management of Chronic Issues and Loss of Consciousness (Due to too much Lyrica)   HPI Hypothyroidism recheck Patient is coming in for thyroid recheck today as well. They deny any issues with hair changes or heat or cold problems or diarrhea or constipation. They deny any chest pain or palpitations. They are currently on no medication and has been subclinical.  Hypertension Patient is currently on chlorthalidone and furosemide and lisinopril, and their blood pressure today is 142/76. Patient denies any lightheadedness or dizziness. Patient denies headaches, blurred vision, chest pains, shortness of breath, or weakness. Denies any side effects from medication and is content with current medication.   CKD stage III Patient is coming in for recheck on her kidneys, given slightly elevated previously.  Patient is coming in for recheck of anxiety and postherpetic neuralgia.  She also sees Dr. Brigitte Pulse for this and he has her on Lyrica although she has had some issues with higher doses and them knocking her out and have lowered her dose more recently and she is also on duloxetine 30 mg but she is taking in the morning as she says she is groggy all day we will have her switch to taking in the evenings.  Relevant past medical, surgical, family and social history reviewed and updated as indicated. Interim medical history since our last visit reviewed. Allergies and medications reviewed and updated.  Review of Systems  Constitutional: Negative for chills and fever.  Eyes: Negative for redness and visual disturbance.  Respiratory: Negative for chest tightness and shortness of breath.   Cardiovascular: Negative  for chest pain and leg swelling.  Musculoskeletal: Negative for back pain and gait problem.  Skin: Negative for rash.  Neurological: Positive for numbness. Negative for weakness, light-headedness and headaches.  Psychiatric/Behavioral: Positive for dysphoric mood. Negative for agitation, behavioral problems, self-injury, sleep disturbance and suicidal ideas. The patient is nervous/anxious.   All other systems reviewed and are negative.   Per HPI unless specifically indicated above   Allergies as of 10/31/2019      Reactions   Nsaids Other (See Comments)   AVOID all NSAID due to history of GI bleeding   Atorvastatin Other (See Comments)   Joint & muscle pain      Medication List       Accurate as of October 31, 2019  2:51 PM. If you have any questions, ask your nurse or doctor.        b complex vitamins capsule Take 1 capsule by mouth daily.   Capsaicin 0.1 % Crea Apply 1 application topically 4 (four) times daily as needed.   cetirizine 10 MG tablet Commonly known as: ZYRTEC Take 10 mg by mouth daily.   chlorthalidone 25 MG tablet Commonly known as: HYGROTON Take 1 tablet (25 mg total) by mouth daily.   clopidogrel 75 MG tablet Commonly known as: PLAVIX TAKE ONE TABLET BY MOUTH DAILY   clotrimazole-betamethasone cream Commonly known as: LOTRISONE APPLY AS DIRECTED TWICE DAILY.   DULoxetine 30 MG capsule Commonly known as: Cymbalta Take 1 capsule (30 mg total) by mouth at bedtime.  What changed: when to take this Changed by: [name], MD   furosemide 40 MG tablet Commonly known as: Lasix Take 1 tablet (40 mg total) by mouth as needed for edema (weight gain).   lisinopril 40 MG tablet Commonly known as: ZESTRIL Take 1 tablet (40 mg total) by mouth daily.   methimazole 5 MG tablet Commonly known as: TAPAZOLE Take 0.5 tablets (2.5 mg total) by mouth daily.   metoprolol succinate 25 MG 24 hr tablet Commonly known as: Toprol XL Take 1 tablet (25 mg  total) by mouth daily.   multivitamin tablet Take 1 tablet by mouth daily.   pantoprazole 40 MG tablet Commonly known as: PROTONIX Take 1 tablet (40 mg total) by mouth daily. 30 minutes before breakfast   pregabalin 100 MG capsule Commonly known as: Lyrica Take 1 capsule (100 mg total) by mouth at bedtime as needed. Patient not sure of strength   rosuvastatin 5 MG tablet Commonly known as: CRESTOR TAKE ONE TABLET BY MOUTH DAILY.        Objective:   BP (!) 142/76   Pulse 73   Temp 98.5 F (36.9 C)   Ht '5\' 4"'$  (1.626 m)   Wt 255 lb (115.7 kg)   BMI 43.77 kg/m   Wt Readings from Last 3 Encounters:  10/31/19 255 lb (115.7 kg)  10/03/19 255 lb 6.4 oz (115.8 kg)  09/28/19 254 lb (115.2 kg)    Physical Exam Vitals and nursing note reviewed.  Constitutional:      General: She is not in acute distress.    Appearance: She is well-developed. She is not diaphoretic.  Eyes:     Conjunctiva/sclera: Conjunctivae normal.  Cardiovascular:     Rate and Rhythm: Normal rate and regular rhythm.     Heart sounds: Normal heart sounds. No murmur heard.   Pulmonary:     Effort: Pulmonary effort is normal. No respiratory distress.     Breath sounds: Normal breath sounds. No wheezing.  Musculoskeletal:        General: No tenderness. Normal range of motion.  Skin:    General: Skin is warm and dry.     Findings: No rash.  Neurological:     Mental Status: She is alert and oriented to person, place, and time.     Coordination: Coordination normal.  Psychiatric:        Behavior: Behavior normal.       Assessment & Plan:   Problem List Items Addressed This Visit      Cardiovascular and Mediastinum   Essential hypertension - Primary   Relevant Orders   CMP14+EGFR     Endocrine   Subclinical hyperthyroidism     Nervous and Auditory   Postherpetic neuralgia     Genitourinary   CKD (chronic kidney disease), stage III   Relevant Orders   CMP14+EGFR     Other   Anxiety     Relevant Medications   DULoxetine (CYMBALTA) 30 MG capsule   Low TSH level   Relevant Orders   Thyroid Panel With TSH    Other Visit Diagnoses    Depression, recurrent (HCC)       Relevant Medications   DULoxetine (CYMBALTA) 30 MG capsule      Patient has drug in the daytime, switch the Cymbalta to the evening, she is seen by Dr. Brigitte Pulse help with neuropathic pain as well.  She had a syncopal episode that was possibly related to medication to the emergency department.  Continue  to follow-up with Dr. Brigitte Pulse as well. Follow up plan: Return in about 3 months (around 01/31/2020), or if symptoms worsen or fail to improve, for Recheck neuralgia.  Counseling provided for all of the vaccine components Orders Placed This Encounter  Procedures  . CMP14+EGFR  . Thyroid Panel With TSH    Caryl Pina, MD [facility] 10/31/2019, 2:51 PM

## 2019-11-01 LAB — CMP14+EGFR
ALT: 9 IU/L (ref 0–32)
AST: 11 IU/L (ref 0–40)
Albumin/Globulin Ratio: 1.1 — ABNORMAL LOW (ref 1.2–2.2)
Albumin: 3.7 g/dL (ref 3.7–4.7)
Alkaline Phosphatase: 87 IU/L (ref 48–121)
BUN/Creatinine Ratio: 20 (ref 12–28)
BUN: 33 mg/dL — ABNORMAL HIGH (ref 8–27)
Bilirubin Total: 0.2 mg/dL (ref 0.0–1.2)
CO2: 23 mmol/L (ref 20–29)
Calcium: 9.8 mg/dL (ref 8.7–10.3)
Chloride: 109 mmol/L — ABNORMAL HIGH (ref 96–106)
Creatinine, Ser: 1.62 mg/dL — ABNORMAL HIGH (ref 0.57–1.00)
GFR calc Af Amer: 35 mL/min/{1.73_m2} — ABNORMAL LOW (ref >59)
GFR calc non Af Amer: 30 mL/min/{1.73_m2} — ABNORMAL LOW (ref >59)
Globulin, Total: 3.3 g/dL (ref 1.5–4.5)
Glucose: 105 mg/dL — ABNORMAL HIGH (ref 65–99)
Potassium: 5 mmol/L (ref 3.5–5.2)
Sodium: 143 mmol/L (ref 134–144)
Total Protein: 7 g/dL (ref 6.0–8.5)

## 2019-11-01 LAB — THYROID PANEL WITH TSH
Free Thyroxine Index: 1.4 (ref 1.2–4.9)
T3 Uptake Ratio: 25 % (ref 24–39)
T4, Total: 5.5 ug/dL (ref 4.5–12.0)
TSH: 0.511 u[IU]/mL (ref 0.450–4.500)

## 2019-11-04 ENCOUNTER — Other Ambulatory Visit: Payer: Self-pay | Admitting: Family Medicine

## 2019-11-04 DIAGNOSIS — N1832 Chronic kidney disease, stage 3b: Secondary | ICD-10-CM

## 2019-11-14 NOTE — Chronic Care Management (AMB) (Signed)
  Chronic Care Management   Follow-up Outreach  10/28/2019 Name: Shelly [name] MRN: [id number] DOB: 03/24/1941  Referred by: [name], [name], MD Reason for referral : Chronic Care Management (RN follow up)   An unsuccessful telephone follow-up was attempted today. The patient was referred to the case management team for assistance with care management and care coordination.   Follow Up Plan: A HIPPA compliant phone message was left for the patient providing contact information and requesting a return call.  The care management team will reach out to the patient again over the next 45 days.   [name], BSN, RN-BC Embedded Chronic Care Manager [facility] Direct Dial: [phone]

## 2019-11-24 ENCOUNTER — Ambulatory Visit (INDEPENDENT_AMBULATORY_CARE_PROVIDER_SITE_OTHER): Payer: Medicare Other | Admitting: Licensed Clinical Social Worker

## 2019-11-24 DIAGNOSIS — N1832 Chronic kidney disease, stage 3b: Secondary | ICD-10-CM

## 2019-11-24 DIAGNOSIS — K219 Gastro-esophageal reflux disease without esophagitis: Secondary | ICD-10-CM

## 2019-11-24 DIAGNOSIS — M1712 Unilateral primary osteoarthritis, left knee: Secondary | ICD-10-CM

## 2019-11-24 DIAGNOSIS — F419 Anxiety disorder, unspecified: Secondary | ICD-10-CM

## 2019-11-24 DIAGNOSIS — I10 Essential (primary) hypertension: Secondary | ICD-10-CM

## 2019-11-24 DIAGNOSIS — I2510 Atherosclerotic heart disease of native coronary artery without angina pectoris: Secondary | ICD-10-CM

## 2019-11-24 NOTE — Chronic Care Management (AMB) (Signed)
Chronic Care Management    Clinical Social Work Follow Up Note  11/24/2019 Name: ONEY TATLOCK MRN: [id number] DOB: [birth year]  CORTNIE RINGEL is a 79 y.o. year old female who is a primary care patient of [name], [name], MD. The CCM team was consulted for assistance with [employer] .   Review of patient status, including review of consultants reports, other relevant assessments, and collaboration with appropriate care team members and the patient's provider was performed as part of comprehensive patient evaluation and provision of chronic care management services.    SDOH (Social Determinants of Health) assessments performed: No;risk for tobacco use; risk for depression; risk for physical inactivity; risk for social isolation    Office Visit from 10/31/2019 in [place]  PHQ-9 Total Score 10     GAD 7 : Generalized Anxiety Score 10/31/2019 09/28/2019 08/29/2019  Nervous, Anxious, on Edge 1 1 1   Control/stop worrying 2 2 1   Worry too much - different things 2 2 1   Trouble relaxing 0 0 0  Restless 0 0 0  Easily annoyed or irritable 0 0 0  Afraid - awful might happen 0 0 0  Total GAD 7 Score 5 5 3   Anxiety Difficulty - - Somewhat difficult    Outpatient Encounter Medications as of 11/24/2019  Medication Sig  . b complex vitamins capsule Take 1 capsule by mouth daily.  . Capsaicin 0.1 % CREA Apply 1 application topically 4 (four) times daily as needed.  . cetirizine (ZYRTEC) 10 MG tablet Take 10 mg by mouth daily.  . chlorthalidone (HYGROTON) 25 MG tablet TAKE ONE TABLET BY MOUTH DAILY.  Marland Kitchen clopidogrel (PLAVIX) 75 MG tablet TAKE ONE TABLET BY MOUTH DAILY  . clotrimazole-betamethasone (LOTRISONE) cream APPLY AS DIRECTED TWICE DAILY.  . DULoxetine (CYMBALTA) 30 MG capsule Take 1 capsule (30 mg total) by mouth at bedtime.  . furosemide (LASIX) 40 MG tablet Take 1 tablet (40 mg total) by mouth as needed for edema (weight gain).  Marland Kitchen lisinopril  (ZESTRIL) 40 MG tablet Take 1 tablet (40 mg total) by mouth daily.  . methimazole (TAPAZOLE) 5 MG tablet Take 0.5 tablets (2.5 mg total) by mouth daily.  . metoprolol succinate (TOPROL XL) 25 MG 24 hr tablet Take 1 tablet (25 mg total) by mouth daily.  . Multiple Vitamin (MULTIVITAMIN) tablet Take 1 tablet by mouth daily.  . pantoprazole (PROTONIX) 40 MG tablet Take 1 tablet (40 mg total) by mouth daily. 30 minutes before breakfast  . pregabalin (LYRICA) 100 MG capsule Take 1 capsule (100 mg total) by mouth at bedtime as needed. Patient not sure of strength  . rosuvastatin (CRESTOR) 5 MG tablet TAKE ONE TABLET BY MOUTH DAILY.   No facility-administered encounter medications on file as of 11/24/2019.    Goals    .  Client will talk with LCSW in next 30 days about depression sympotms related to health conditions faced (pt-stated)      CARE PLAN ENTRY   Current Barriers:  . Decreased appetite . Mobility issues in client with Chronic diagnoses of HTN, GERD,Anxiety,CAD, CKD,OA . Grief issues faced  Clinical Social Work Clinical Goal(s):  Marland Kitchen LCSW will talk with client in next 30 days to discuss depression symptoms of client related to her health conditions  Interventions: . Talked with client about CCM services . Talked with client about her upcoming medical appointments . Provided counseling support for client. . Encouraged client to talk with RNCM as needed to address nursing  issues she faced . Talked with client about client decreased energy . Client and LCSW talked about client's pain issues related to shingles issues she faces . Client said she is trying to manage shingles. LCSW talked with client about management of medical issues. . Talked with client about her support from neurologist (Dr Brigitte Pulse) . Talked with client about fall risk for client . Talked with client about ambulation issues of client . Talked with client about sleeping challenges of client . Talked with client about  weakness in her right arm (hard for her to bath due to weakness in right arm) . Talked with client about shakiness in her right hand . Talked with client about decreased appetite of client . Talked with client about family support (has support from her daughter and granddaughter) . Talked with client about relaxation techniques (watches TV, does word puzzles) . Talked with Keaja about her daily in home care needs. . Talked with Brithany about mobility challenges with her left knee  Patient Self Care Activities:   Takes medications as prescribed Attends scheduled medical appointments  . Patient self care deficits: Marland Kitchen Mobility issues  Initial goal documentation    Follow Up Plan:LCSW to call client in next 4 weeks to talk with client about depression symptoms faced by client  Norva Riffle.Waynetta Metheny MSW, LCSW Licensed Clinical Social Worker [facility] [phone]

## 2019-11-24 NOTE — Patient Instructions (Addendum)
Licensed Clinical Education officer, museum Visit Information  Goals we discussed today:     Client will talk with LCSW in next 30 days about depression sympotms related to health conditions faced (pt-stated)         CARE PLAN ENTRY   Current Barriers:   Decreased appetite  Mobility issues in client with Chronic diagnoses of HTN, GERD,Anxiety,CAD, CKD,OA  Grief issues faced  Clinical Social Work Clinical Goal(s):   LCSW will talk with client in next 30 days to discuss depression symptoms of client related to her health conditions  Interventions:  Talked with client about CCM services  Talked with client about her upcoming medical appointments  Provided counseling support for client.  Encouraged client to talk with RNCM as needed to address nursing issues she faced  Talked with client about client decreased energy  Client and LCSW talked about client's pain issues related to shingles issues she faces  Client said she is trying to manage shingles. LCSW talked with client about management of medical issues.  Talked with client about her support from neurologist (Dr Brigitte Pulse)  Talked with client about fall risk for client  Talked with client about ambulation issues of client  Talked with client about sleeping challenges of client  Talked with client about weakness in her right arm (hard for her to bath due to weakness in right arm)  Talked with client about shakiness in her right hand  Talked with client about decreased appetite of client  Talked with client about family support (has support from her daughter and granddaughter)  Talked with client about relaxation techniques (watches TV, does word puzzles)  Talked with Ed about her daily in home care needs.  Talked with Niko about mobility challenges with her left knee  Patient Self Care Activities:   Takes medications as prescribed Attends scheduled medical appointments   Patient self care  deficits:  Mobility issues  Initial goal documentation    Follow Up Plan:LCSW to call client in next 4 weeks to talk with client about depression symptoms faced by client  Materials Provided: No  The patient verbalized understanding of instructions provided today and declined a print copy of patient instruction materials.   Norva Riffle.Casady Voshell MSW, LCSW Licensed Clinical Social Worker [facility] [phone]

## 2019-11-25 ENCOUNTER — Ambulatory Visit: Payer: Medicare Other | Admitting: *Deleted

## 2019-11-25 ENCOUNTER — Other Ambulatory Visit: Payer: Self-pay | Admitting: Family Medicine

## 2019-11-25 DIAGNOSIS — F339 Major depressive disorder, recurrent, unspecified: Secondary | ICD-10-CM

## 2019-11-25 DIAGNOSIS — M1712 Unilateral primary osteoarthritis, left knee: Secondary | ICD-10-CM | POA: Diagnosis not present

## 2019-11-25 DIAGNOSIS — I2510 Atherosclerotic heart disease of native coronary artery without angina pectoris: Secondary | ICD-10-CM

## 2019-11-25 DIAGNOSIS — I10 Essential (primary) hypertension: Secondary | ICD-10-CM

## 2019-11-25 DIAGNOSIS — B0229 Other postherpetic nervous system involvement: Secondary | ICD-10-CM

## 2019-11-25 NOTE — Patient Instructions (Signed)
Visit Information  Goals Addressed            This Visit's Progress   . "I want to talk to someone about chronic pain"       [place] (see longtitudinal plan of care for additional care plan information)  Feelings of hopelessness/sadness in patient with chronic pain related to post herpetic neuralgia and grief   Current Barriers:  Marland Kitchen Knowledge Deficits related to psychosocial support options . Chronic pain . Transportation . Doesn't want to be a burden to her family  Nurse Case Manager Clinical Goal(s):  Marland Kitchen Over the next 30 days, patient will talk with LCSW about psychosocial issues related to chronic pain . Over the next 30 days, patient will talk with RN Care Manager about chronic pain  Interventions:  . Chart reviewed including recent office notes and lab reports . Consulted by LCSW who spoke with patient yesterday . Talked with patient by telephone . Reviewed Medications: Cymbalta, Lyrica, capsaicin cream . Reviewed upcoming appointments: LCSW on 12/27/19 . Rescheduled appt with RNCM from 8/12 to 12/14/19 . Discussed upcoming neurology appt with Dr Manuella Ghazi . Verified that patient does not have a current shingles outbreak and that pain is chronic in nature from postherpetic neuralgia s/p shingles . Encouraged patient to reach out to [facility] as needed . Encouraged patient to seek emergency medical care as needed  Patient Self Care Activities:  . Performs ADL's independently . Does not drive and depends on family for transportation  Please see past updates related to this goal by clicking on the "Past Updates" button in the selected goal         Patient verbalizes understanding of instructions provided today.   Follow-up Plan Telephone follow up appointment with care management team member scheduled for:12/14/19  [name], BSN, RN-BC [place] [facility] [place] [facility] Direct Dial: [phone]

## 2019-11-25 NOTE — Chronic Care Management (AMB) (Signed)
Chronic Care Management   Follow Up Note   11/25/2019 Name: Shelly [name] MRN: [id number] DOB: 07-Apr-1941  Referred by: [name], [name], MD Reason for referral : Chronic Care Management (RN follow-up per LCSW request)   Shelly [name] is a 79 y.o. year old female who is a primary care patient of [name], [name], MD. The CCM team was consulted for assistance with chronic disease management and care coordination needs.    Review of patient status, including review of consultants reports, relevant laboratory and other test results, and collaboration with appropriate care team members and the patient's provider was performed as part of comprehensive patient evaluation and provision of chronic care management services.    I spoke with patient by telephone today at the request of Theadore Nan, LCSW who spoke with patient yesterday. We briefly discussed her chronic pain due to postherpetic neuralgia and scheduled a follow-up telephone call for 12/14/19.   SDOH (Social Determinants of Health) assessments performed: No See Care Plan activities for detailed interventions related to [facility])     Outpatient Encounter Medications as of 11/25/2019  Medication Sig  . b complex vitamins capsule Take 1 capsule by mouth daily.  . Capsaicin 0.1 % CREA Apply 1 application topically 4 (four) times daily as needed.  . cetirizine (ZYRTEC) 10 MG tablet Take 10 mg by mouth daily.  . chlorthalidone (HYGROTON) 25 MG tablet TAKE ONE TABLET BY MOUTH DAILY.  Marland Kitchen clopidogrel (PLAVIX) 75 MG tablet TAKE ONE TABLET BY MOUTH DAILY  . clotrimazole-betamethasone (LOTRISONE) cream APPLY AS DIRECTED TWICE DAILY.  . DULoxetine (CYMBALTA) 30 MG capsule Take 1 capsule (30 mg total) by mouth at bedtime.  . furosemide (LASIX) 40 MG tablet Take 1 tablet (40 mg total) by mouth as needed for edema (weight gain).  Marland Kitchen lisinopril (ZESTRIL) 40 MG tablet Take 1 tablet (40 mg total) by mouth daily.  . methimazole (TAPAZOLE) 5 MG  tablet Take 0.5 tablets (2.5 mg total) by mouth daily.  . metoprolol succinate (TOPROL XL) 25 MG 24 hr tablet Take 1 tablet (25 mg total) by mouth daily.  . Multiple Vitamin (MULTIVITAMIN) tablet Take 1 tablet by mouth daily.  . pantoprazole (PROTONIX) 40 MG tablet Take 1 tablet (40 mg total) by mouth daily. 30 minutes before breakfast  . pregabalin (LYRICA) 100 MG capsule Take 1 capsule (100 mg total) by mouth at bedtime as needed. Patient not sure of strength  . rosuvastatin (CRESTOR) 5 MG tablet TAKE ONE TABLET BY MOUTH DAILY.   No facility-administered encounter medications on file as of 11/25/2019.     RN Care Plan           This Visit's Progress   . "I want to talk to someone about chronic pain"       [place] (see longtitudinal plan of care for additional care plan information)  Feelings of hopelessness/sadness in patient with chronic pain related to post herpetic neuralgia and grief   Current Barriers:  Marland Kitchen Knowledge Deficits related to psychosocial support options . Chronic pain . Transportation . Doesn't want to be a burden to her family  Nurse Case Manager Clinical Goal(s):  Marland Kitchen Over the next 30 days, patient will talk with LCSW about psychosocial issues related to chronic pain . Over the next 30 days, patient will talk with RN Care Manager about chronic pain  Interventions:  . Chart reviewed including recent office notes and lab reports . Consulted by LCSW who spoke with patient yesterday .  Talked with patient by telephone . Reviewed Medications: Cymbalta, Lyrica, capsaicin cream . Reviewed upcoming appointments: LCSW on 12/27/19 . Rescheduled appt with RNCM from 8/12 to 12/14/19 . Discussed upcoming neurology appt with Dr Manuella Ghazi . Verified that patient does not have a current shingles outbreak and that pain is chronic in nature from postherpetic neuralgia s/p shingles . Encouraged patient to reach out to [facility] as needed . Encouraged patient to seek  emergency medical care as needed  Patient Self Care Activities:  . Performs ADL's independently . Does not drive and depends on family for transportation  Please see past updates related to this goal by clicking on the "Past Updates" button in the selected goal          Plan:   Telephone follow up appointment with care management team member scheduled for:  [name], BSN, RN-BC [place] [facility] [place] Management Direct Dial: [phone]

## 2019-11-28 DIAGNOSIS — D649 Anemia, unspecified: Secondary | ICD-10-CM | POA: Diagnosis not present

## 2019-11-29 LAB — CBC WITH DIFFERENTIAL/PLATELET
Absolute Monocytes: 538 cells/uL (ref 200–950)
Basophils Absolute: 28 cells/uL (ref 0–200)
Basophils Relative: 0.5 %
Eosinophils Absolute: 67 cells/uL (ref 15–500)
Eosinophils Relative: 1.2 %
HCT: 32.8 % — ABNORMAL LOW (ref 35.0–45.0)
Hemoglobin: 10.3 g/dL — ABNORMAL LOW (ref 11.7–15.5)
Lymphs Abs: 1999 cells/uL (ref 850–3900)
MCH: 30.1 pg (ref 27.0–33.0)
MCHC: 31.4 g/dL — ABNORMAL LOW (ref 32.0–36.0)
MCV: 95.9 fL (ref 80.0–100.0)
MPV: 10.6 fL (ref 7.5–12.5)
Monocytes Relative: 9.6 %
Neutro Abs: 2968 cells/uL (ref 1500–7800)
Neutrophils Relative %: 53 %
Platelets: 271 10*3/uL (ref 140–400)
RBC: 3.42 10*6/uL — ABNORMAL LOW (ref 3.80–5.10)
RDW: 13.5 % (ref 11.0–15.0)
Total Lymphocyte: 35.7 %
WBC: 5.6 10*3/uL (ref 3.8–10.8)

## 2019-11-29 LAB — IRON,TIBC AND FERRITIN PANEL
%SAT: 26 % (calc) (ref 16–45)
Ferritin: 94 ng/mL (ref 16–288)
Iron: 58 ug/dL (ref 45–160)
TIBC: 221 mcg/dL (calc) — ABNORMAL LOW (ref 250–450)

## 2019-11-30 ENCOUNTER — Ambulatory Visit: Payer: Medicare Other | Admitting: Gastroenterology

## 2019-11-30 ENCOUNTER — Other Ambulatory Visit: Payer: Self-pay

## 2019-11-30 ENCOUNTER — Encounter: Payer: Self-pay | Admitting: Gastroenterology

## 2019-11-30 VITALS — BP 185/77 | HR 74 | Temp 97.4°F | Ht 64.0 in | Wt 256.4 lb

## 2019-11-30 DIAGNOSIS — K219 Gastro-esophageal reflux disease without esophagitis: Secondary | ICD-10-CM | POA: Diagnosis not present

## 2019-11-30 MED ORDER — PANTOPRAZOLE SODIUM 40 MG PO TBEC: 40.0000 mg | DELAYED_RELEASE_TABLET | Freq: Every day | ORAL | 3 refills | Status: DC

## 2019-11-30 NOTE — Progress Notes (Signed)
Referring Provider: [name], [name], MD Primary Care Physician:  [name], [name], MD Primary GI: previously Dr. Oneida Alar , now Dr. Eulas Post   Chief Complaint  Patient presents with  . Gastroesophageal Reflux    HPI:   Shelly [name] is a 79 y.o. female presenting today with a history of diverticular bleed several years ago, IDA with capsule study showing enteritis due to aspirin use, chronic GERD.Prefers frequent visits with health care team. Hgb in Jan 2021 10.2, similar to prior. Ferritin normal. Repeat Hgb 10.3, similar to prior. Ferritin 94 and sats 26. Iron 58.   Chronic normocytic anemia in setting of CKD. Postherpeutic neuralgia still bothering her. Appetite is not good. Weight is stable. GERD controlled. No abdominal pain. Cheerios in the morning with BM in evening. Protonix every other day.   Past Medical History:  Diagnosis Date  . Allergy   . Anemia    GI bleed  . Anginal pain ([place])   . Anxiety   . Arthritis   . Blood transfusion without reported diagnosis   . Cataract   . Chronic kidney disease   . Coronary artery disease    SEVERE  . GERD (gastroesophageal reflux disease)   . GI bleed 12/2013  . Goiter   . Hyperlipidemia   . Hypertension   . Myocardial infarct ([place])   . Myocardial infarction ([place]) 08/2013  . Neuralgia of right lower extremity 07/13/2017  . Pancolonic diverticulosis 12/2013  . Post herpetic neuralgia     Past Surgical History:  Procedure Laterality Date  . CARPAL TUNNEL RELEASE Bilateral   . COLONOSCOPY N/A 01/13/2014   Dr. Hung:pandiverticulosis   . COLONOSCOPY N/A 12/14/2015   Dr. Michail Sermon: pancolonic diverticulosis, internal hemorrhoids   . CORNEAL TRANSPLANT    . CORONARY ANGIOPLASTY  08/2013  . ESOPHAGOGASTRODUODENOSCOPY N/A 12/14/2015   Dr. Michail Sermon: normal   . GIVENS CAPSULE STUDY N/A 08/13/2016   Dr. Oneida Alar: enteritis due to aspirin.   Marland Kitchen HEEL SPUR EXCISION    . LEFT HEART CATHETERIZATION WITH CORONARY ANGIOGRAM N/A  08/23/2013   Procedure: LEFT HEART CATHETERIZATION WITH CORONARY ANGIOGRAM;  Surgeon: Peter M Martinique, MD;  Location[facility] LAB;  Service: Cardiovascular;  Laterality: N/A;  . PERCUTANEOUS CORONARY STENT INTERVENTION (PCI-S) N/A 08/24/2013   Procedure: PERCUTANEOUS CORONARY STENT INTERVENTION (PCI-S);  Surgeon: Peter M Martinique, MD;  Location: [facility] CATH LAB;  Service: Cardiovascular;  Laterality: N/A;  . TONSILLECTOMY    . TUBAL LIGATION      Current Outpatient Medications  Medication Sig Dispense Refill  . Capsaicin 0.1 % CREA Apply 1 application topically 4 (four) times daily as needed. 60 g 5  . cetirizine (ZYRTEC) 10 MG tablet Take 10 mg by mouth daily.    . chlorthalidone (HYGROTON) 25 MG tablet TAKE ONE TABLET BY MOUTH DAILY. 30 tablet 2  . clopidogrel (PLAVIX) 75 MG tablet TAKE ONE TABLET BY MOUTH DAILY 90 tablet 3  . clotrimazole-betamethasone (LOTRISONE) cream APPLY AS DIRECTED TWICE DAILY. 45 g 2  . DULoxetine (CYMBALTA) 30 MG capsule Take 1 capsule (30 mg total) by mouth at bedtime. 30 capsule 2  . furosemide (LASIX) 40 MG tablet Take 1 tablet (40 mg total) by mouth as needed for edema (weight gain). 30 tablet 6  . lisinopril (ZESTRIL) 40 MG tablet Take 1 tablet (40 mg total) by mouth daily. 90 tablet 3  . methimazole (TAPAZOLE) 5 MG tablet Take 0.5 tablets (2.5 mg total) by mouth daily. 15 tablet  3  . metoprolol succinate (TOPROL XL) 25 MG 24 hr tablet Take 1 tablet (25 mg total) by mouth daily. 90 tablet 3  . Multiple Vitamin (MULTIVITAMIN) tablet Take 1 tablet by mouth daily.    . pantoprazole (PROTONIX) 40 MG tablet Take 1 tablet (40 mg total) by mouth daily. 30 minutes before breakfast 90 tablet 3  . pregabalin (LYRICA) 100 MG capsule Take 1 capsule (100 mg total) by mouth at bedtime as needed. Patient not sure of strength 30 capsule 5  . rosuvastatin (CRESTOR) 5 MG tablet TAKE ONE TABLET BY MOUTH DAILY. 90 tablet 3  . b complex vitamins capsule Take 1 capsule by mouth daily.  (Patient not taking: Reported on 11/30/2019)     No current facility-administered medications for this visit.    Allergies as of 11/30/2019 - Review Complete 11/30/2019  Allergen Reaction Noted  . Nsaids Other (See Comments) 07/25/2016  . Atorvastatin Other (See Comments) 11/30/2018    Family History  Problem Relation Age of Onset  . Hypertension Brother   . Transient ischemic attack Brother   . Cancer Mother        unsure of type   . Other Father        grangrene   . Asthma Sister   . Stroke Sister   . COPD Sister   . Stroke Sister   . Early death Brother   . Early death Brother   . Liver cancer Daughter   . Cancer Daughter   . Hernia Son        Umbilical  . GI problems Son   . Pancreatic disease Daughter        pancreatectomy  . Cancer Daughter   . Diverticulitis Daughter   . Arthritis Daughter        knee   . Arthritis Daughter        shoulder   . Anemia Daughter   . GER disease Son     Social History   Socioeconomic History  . Marital status: Divorced    Spouse name: Not on file  . Number of children: 8  . Years of education: 76  . Highest education level: 11th grade  Occupational History  . 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 Smoking status: Never Smoker  . Smokeless tobacco: Never Used  Vaping Use  . Vaping Use: Never used  Substance and Sexual Activity  . Alcohol use: No    Alcohol/week: 0.0 standard drinks  . Drug use: No  . Sexual activity: Not Currently    Birth control/protection: Surgical    Comment: divorced, lives with daughter and granddaughters  Other Topics Concern  . 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   No daily caffeine use.   Social Determinants of Health   Financial Resource Strain: Low Risk   . Difficulty of Paying Living Expenses: Not very hard  Food  Insecurity: No Food Insecurity  . Worried About Charity fundraiser in the Last Year: Never true  . Ran Out of Food in the Last Year: Never true  Transportation Needs: No Transportation Needs  . Lack of Transportation (Medical): No  . Lack of Transportation (Non-Medical): No  Physical Activity: Inactive  . Days of Exercise per Week: 0 days  .  Minutes of Exercise per Session: 0 min  Stress: No Stress Concern Present  . Feeling of Stress : Not at all  Social Connections: Moderately Integrated  . Frequency of Communication with Friends and Family: More than three times a week  . Frequency of Social Gatherings with Friends and Family: More than three times a week  . Attends Religious Services: More than 4 times per year  . Active Member of Clubs or Organizations: Yes  . Attends Archivist Meetings: More than 4 times per year  . Marital Status: Divorced    Review of Systems: Gen: Denies fever, chills, anorexia. Denies fatigue, weakness, weight loss.  CV: Denies chest pain, palpitations, syncope, peripheral edema, and claudication. Resp: Denies dyspnea at rest, cough, wheezing, coughing up blood, and pleurisy. GI:see HPI Derm: Denies rash, itching, dry skin Psych: Denies depression, anxiety, memory loss, confusion. No homicidal or suicidal ideation.  Heme: Denies bruising, bleeding, and enlarged lymph nodes.  Physical Exam: BP (!) 185/77   Pulse 74   Temp (!) 97.4 F (36.3 C) (Oral)   Ht 5\' 4"  (1.626 m)   Wt 256 lb 6.4 oz (116.3 kg)   BMI 44.01 kg/m  General:   Alert and oriented. No distress noted. Pleasant and cooperative.  Head:  Normocephalic and atraumatic. Eyes:  Conjuctiva clear without scleral icterus. Mouth:  Mask in place Cardiac: S1 S2 present Abdomen:  +BS, soft, non-tender and non-distended. No rebound or guarding. Sitting in chair Extremities:  Chronic lower extremity edema  Neurologic:  Alert and  oriented x4 Psych:  Alert and cooperative. Normal mood  and affect.  ASSESSMENT: RESHA FILIPPONE is a 79 y.o. female presenting today with history of diverticular bleed several years ago, IDA with capsule study showing enteritis due to aspirin use, chronic GERD, desiring close follow-up with [place].   Doing well from a GI standpoint. Taking Protonix every other day.   Anemia in setting of CKD. Reviewed recent CBC with Hgb stable. Ferritin 94.    PLAN:  Protonix every other day  Return in 3 months  Call if any concerns   Annitta Needs, PhD, [facility] Gastroenterology

## 2019-11-30 NOTE — Patient Instructions (Signed)
Continue Protonix every other day.  We will see you in 3 months!   Please call if you need anything!   I enjoyed seeing you again today! As you know, I value our relationship and want to provide genuine, compassionate, and quality care. I welcome your feedback. If you receive a survey regarding your visit,  I greatly appreciate you taking time to fill this out. See you next time!  Annitta Needs, PhD, ANP-BC [facility] Gastroenterology

## 2019-12-14 ENCOUNTER — Ambulatory Visit: Payer: Medicare Other | Admitting: *Deleted

## 2019-12-14 DIAGNOSIS — F339 Major depressive disorder, recurrent, unspecified: Secondary | ICD-10-CM

## 2019-12-14 DIAGNOSIS — B0229 Other postherpetic nervous system involvement: Secondary | ICD-10-CM

## 2019-12-14 DIAGNOSIS — I10 Essential (primary) hypertension: Secondary | ICD-10-CM

## 2019-12-14 NOTE — Chronic Care Management (AMB) (Signed)
  Chronic Care Management   Follow-up Outreach Note  12/14/2019 Name: Shelly [name] MRN: [id number] DOB: 1940-07-08  Referred by: [name], [name], MD Reason for referral : Chronic Care Management   An unsuccessful telephone follow-up was attempted today. The patient was referred to the case management team for assistance with care management and care coordination.   Follow Up Plan: A HIPPA compliant phone message was left for the patient providing contact information and requesting a return call.  The care management team will reach out to the patient again over the next 30 days.   [name], BSN, RN-BC Embedded Chronic Care Manager [facility] [place] Management Direct Dial: [phone]

## 2019-12-27 ENCOUNTER — Telehealth: Payer: Medicare Other

## 2019-12-29 ENCOUNTER — Telehealth: Payer: Medicare Other

## 2019-12-29 ENCOUNTER — Other Ambulatory Visit: Payer: Self-pay | Admitting: Family Medicine

## 2019-12-30 ENCOUNTER — Other Ambulatory Visit: Payer: Self-pay | Admitting: Nephrology

## 2019-12-30 ENCOUNTER — Other Ambulatory Visit (HOSPITAL_COMMUNITY): Payer: Self-pay | Admitting: Nephrology

## 2019-12-30 DIAGNOSIS — Z79899 Other long term (current) drug therapy: Secondary | ICD-10-CM

## 2019-12-30 DIAGNOSIS — I129 Hypertensive chronic kidney disease with stage 1 through stage 4 chronic kidney disease, or unspecified chronic kidney disease: Secondary | ICD-10-CM | POA: Diagnosis not present

## 2019-12-30 DIAGNOSIS — N1832 Chronic kidney disease, stage 3b: Secondary | ICD-10-CM

## 2019-12-30 DIAGNOSIS — D638 Anemia in other chronic diseases classified elsewhere: Secondary | ICD-10-CM

## 2019-12-30 DIAGNOSIS — I5032 Chronic diastolic (congestive) heart failure: Secondary | ICD-10-CM | POA: Diagnosis not present

## 2019-12-30 DIAGNOSIS — I10 Essential (primary) hypertension: Secondary | ICD-10-CM

## 2020-01-02 DIAGNOSIS — E0590 Thyrotoxicosis, unspecified without thyrotoxic crisis or storm: Secondary | ICD-10-CM | POA: Diagnosis not present

## 2020-01-03 ENCOUNTER — Other Ambulatory Visit: Payer: Self-pay

## 2020-01-03 ENCOUNTER — Other Ambulatory Visit: Payer: Medicare Other

## 2020-01-03 DIAGNOSIS — D638 Anemia in other chronic diseases classified elsewhere: Secondary | ICD-10-CM | POA: Diagnosis not present

## 2020-01-03 DIAGNOSIS — N1832 Chronic kidney disease, stage 3b: Secondary | ICD-10-CM | POA: Diagnosis not present

## 2020-01-03 DIAGNOSIS — D519 Vitamin B12 deficiency anemia, unspecified: Secondary | ICD-10-CM | POA: Diagnosis not present

## 2020-01-03 DIAGNOSIS — Z79899 Other long term (current) drug therapy: Secondary | ICD-10-CM | POA: Diagnosis not present

## 2020-01-03 DIAGNOSIS — I129 Hypertensive chronic kidney disease with stage 1 through stage 4 chronic kidney disease, or unspecified chronic kidney disease: Secondary | ICD-10-CM | POA: Diagnosis not present

## 2020-01-03 DIAGNOSIS — I5032 Chronic diastolic (congestive) heart failure: Secondary | ICD-10-CM | POA: Diagnosis not present

## 2020-01-03 LAB — T3, FREE: T3, Free: 3.1 pg/mL (ref 2.3–4.2)

## 2020-01-03 LAB — T4, FREE: Free T4: 1 ng/dL (ref 0.8–1.8)

## 2020-01-03 LAB — TSH: TSH: 0.55 mIU/L (ref 0.40–4.50)

## 2020-01-05 ENCOUNTER — Encounter: Payer: Self-pay | Admitting: "Endocrinology

## 2020-01-05 ENCOUNTER — Other Ambulatory Visit: Payer: Self-pay

## 2020-01-05 ENCOUNTER — Ambulatory Visit: Payer: Medicare Other | Admitting: "Endocrinology

## 2020-01-05 VITALS — BP 146/64 | HR 80 | Ht 64.0 in | Wt 255.0 lb

## 2020-01-05 DIAGNOSIS — E0590 Thyrotoxicosis, unspecified without thyrotoxic crisis or storm: Secondary | ICD-10-CM | POA: Diagnosis not present

## 2020-01-05 NOTE — Progress Notes (Signed)
01/05/2020, 10:30 PM                                     Endocrinology follow-up note    Subjective:    Patient ID: Shelly [name], female    DOB: 16-Jan-1941, PCP Shelly [name], Shelly Kaufmann, Shelly [name]   Past Medical History:  Diagnosis Date  . Allergy   . Anemia    GI bleed  . Anginal pain ([place])   . Anxiety   . Arthritis   . Blood transfusion without reported diagnosis   . Cataract   . Chronic kidney disease   . Coronary artery disease    SEVERE  . GERD (gastroesophageal reflux disease)   . GI bleed 12/2013  . Goiter   . Hyperlipidemia   . Hypertension   . Myocardial infarct ([place])   . Myocardial infarction ([place]) 08/2013  . Neuralgia of right lower extremity 07/13/2017  . Pancolonic diverticulosis 12/2013  . Post herpetic neuralgia    Past Surgical History:  Procedure Laterality Date  . CARPAL TUNNEL RELEASE Bilateral   . COLONOSCOPY N/A 01/13/2014   Dr. Hung:pandiverticulosis   . COLONOSCOPY N/A 12/14/2015   Dr. Michail Sermon: pancolonic diverticulosis, internal hemorrhoids   . CORNEAL TRANSPLANT    . CORONARY ANGIOPLASTY  08/2013  . ESOPHAGOGASTRODUODENOSCOPY N/A 12/14/2015   Dr. Michail Sermon: normal   . GIVENS CAPSULE STUDY N/A 08/13/2016   Dr. Oneida Alar: enteritis due to aspirin.   Marland Kitchen HEEL SPUR EXCISION    . LEFT HEART CATHETERIZATION WITH CORONARY ANGIOGRAM N/A 08/23/2013   Procedure: LEFT HEART CATHETERIZATION WITH CORONARY ANGIOGRAM;  Surgeon: Peter M Martinique, Shelly [name];  Location[facility] CATH LAB;  Service: Cardiovascular;  Laterality: N/A;  . PERCUTANEOUS CORONARY STENT INTERVENTION (PCI-S) N/A 08/24/2013   Procedure: PERCUTANEOUS CORONARY STENT INTERVENTION (PCI-S);  Surgeon: Peter M Martinique, Shelly [name];  Location: [facility] CATH LAB;  Service: Cardiovascular;  Laterality: N/A;  . TONSILLECTOMY    . TUBAL LIGATION     Social History   Socioeconomic History  . Marital status: Divorced    Spouse name: Not on file  . Number of children: 8  . Years of  education: 60  . Highest education level: 11th grade  Occupational History  . 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 Smoking status: Never Smoker  . Smokeless tobacco: Never Used  Vaping Use  . Vaping Use: Never used  Substance and Sexual Activity  . Alcohol use: No    Alcohol/week: 0.0 standard drinks  . Drug use: No  . Sexual activity: Not Currently    Birth control/protection: Surgical    Comment: divorced, lives with daughter and granddaughters  Other Topics Concern  . 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   No daily caffeine use.   Social Determinants of Health   Financial Resource Strain: Low Risk   . Difficulty of Paying Living Expenses: Not very hard  Food Insecurity: No Food Insecurity  . Worried About  Running Out of Food in the Last Year: Never true  . Ran Out of Food in the Last Year: Never true  Transportation Needs: No Transportation Needs  . Lack of Transportation (Medical): No  . Lack of Transportation (Non-Medical): No  Physical Activity: Inactive  . Days of Exercise per Week: 0 days  . Minutes of Exercise per Session: 0 min  Stress: No Stress Concern Present  . Feeling of Stress : Not at all  Social Connections: Moderately Integrated  . Frequency of Communication with Friends and Family: More than three times a week  . Frequency of Social Gatherings with Friends and Family: More than three times a week  . Attends Religious Services: More than 4 times per year  . Active Member of Clubs or Organizations: Yes  . Attends Archivist Meetings: More than 4 times per year  . Marital Status: Divorced   Family History  Problem Relation Age of Onset  . Hypertension Brother   . Transient ischemic attack Brother   . Cancer Mother        unsure of type   . Other Father        grangrene    . Asthma Sister   . Stroke Sister   . COPD Sister   . Stroke Sister   . Early death Brother   . Early death Brother   . Liver cancer Daughter   . Cancer Daughter   . Hernia Son        Umbilical  . GI problems Son   . Pancreatic disease Daughter        pancreatectomy  . Cancer Daughter   . Diverticulitis Daughter   . Arthritis Daughter        knee   . Arthritis Daughter        shoulder   . Anemia Daughter   . GER disease Son    Outpatient Encounter Medications as of 01/05/2020  Medication Sig  . Capsaicin 0.1 % CREA Apply 1 application topically 4 (four) times daily as needed.  . cetirizine (ZYRTEC) 10 MG tablet Take 10 mg by mouth daily.  . chlorthalidone (HYGROTON) 25 MG tablet TAKE ONE TABLET BY MOUTH DAILY.  Marland Kitchen clopidogrel (PLAVIX) 75 MG tablet TAKE ONE TABLET BY MOUTH DAILY  . clotrimazole-betamethasone (LOTRISONE) cream APPLY AS DIRECTED TWICE DAILY.  . DULoxetine (CYMBALTA) 30 MG capsule Take 1 capsule (30 mg total) by mouth at bedtime.  . furosemide (LASIX) 40 MG tablet Take 1 tablet (40 mg total) by mouth as needed for edema (weight gain).  Marland Kitchen lisinopril (ZESTRIL) 40 MG tablet TAKE ONE TABLET BY MOUTH DAILY.  . methimazole (TAPAZOLE) 5 MG tablet Take 0.5 tablets (2.5 mg total) by mouth daily.  . metoprolol succinate (TOPROL XL) 25 MG 24 hr tablet Take 1 tablet (25 mg total) by mouth daily.  . Multiple Vitamin (MULTIVITAMIN) tablet Take 1 tablet by mouth daily.  . pantoprazole (PROTONIX) 40 MG tablet Take 1 tablet (40 mg total) by mouth daily. 30 minutes before breakfast  . pregabalin (LYRICA) 100 MG capsule Take 1 capsule (100 mg total) by mouth at bedtime as needed. Patient not sure of strength  . rosuvastatin (CRESTOR) 5 MG tablet TAKE ONE TABLET BY MOUTH DAILY.   No facility-administered encounter medications on file as of 01/05/2020.   ALLERGIES: Allergies  Allergen Reactions  . Nsaids Other (See Comments)    AVOID all NSAID due to history of GI bleeding  .  Atorvastatin Other (See Comments)  Joint & muscle pain    VACCINATION STATUS: Immunization History  Administered Date(s) Administered  . Pneumococcal Conjugate-13 10/02/2016  . Pneumococcal Polysaccharide-23 08/25/2013  . Zoster Recombinat (Shingrix) 03/17/2018    HPI Shelly [name] is 79 y.o. female who is seen in follow-up after she was seen in consultation for subclinical hyperthyroidism.    PMD: Shelly [name], Shelly Kaufmann, Shelly [name].  Due to septic symptoms of thyrotoxicosis, she was started on low-dose methimazole during her last visit.  She continued to respond to treatment, presents with thyroid function test within target range .  She has no new complaints today. -She has steady weight, denies palpitations, tremors, nor heat intolerance.     She denies any history of goiter, no family history of thyroid dysfunction or thyroid malignancy.  Her thyroid uptake and scan on June 14, 2019 showed 24-hour uptake of 10.3% which is normal.  There was asymmetric thyroid activity with generally increased uptake in the right lobe.  No focal hot or cold nodules identified.  Her medical history includes hypertension and hyperlipidemia on treatment.  Review of Systems Limited as above.  Objective:    BP (!) 146/64   Pulse 80   Ht 5\' 4"  (1.626 m)   Wt 255 lb (115.7 kg)   BMI 43.77 kg/m   Wt Readings from Last 3 Encounters:  01/05/20 255 lb (115.7 kg)  11/30/19 256 lb 6.4 oz (116.3 kg)  10/31/19 255 lb (115.7 kg)    Physical Exam   CMP     Component Value Date/Time   NA 143 10/31/2019 1505   K 5.0 10/31/2019 1505   CL 109 (H) 10/31/2019 1505   CO2 23 10/31/2019 1505   GLUCOSE 105 (H) 10/31/2019 1505   GLUCOSE 92 12/21/2018 1547   BUN 33 (H) 10/31/2019 1505   CREATININE 1.62 (H) 10/31/2019 1505   CREATININE 1.31 (H) 12/21/2018 1547   CALCIUM 9.8 10/31/2019 1505   PROT 7.0 10/31/2019 1505   ALBUMIN 3.7 10/31/2019 1505   AST 11 10/31/2019 1505   ALT 9 10/31/2019 1505    ALKPHOS 87 10/31/2019 1505   BILITOT 0.2 10/31/2019 1505   GFRNONAA 30 (L) 10/31/2019 1505   GFRAA 35 (L) 10/31/2019 1505     Diabetic Labs (most recent): Lab Results  Component Value Date   HGBA1C 6.0 (H) 12/09/2015   HGBA1C 6.0 (H) 08/23/2013     Lipid Panel ( most recent) Lipid Panel     Component Value Date/Time   CHOL 168 04/06/2019 1602   TRIG 72 04/06/2019 1602   HDL 53 04/06/2019 1602   CHOLHDL 3.2 04/06/2019 1602   CHOLHDL 2.6 11/20/2016 0854   VLDL 11 11/20/2016 0854   LDLCALC 101 (H) 04/06/2019 1602   LABVLDL 14 04/06/2019 1602      Lab Results  Component Value Date   TSH 0.55 01/02/2020   TSH 0.511 10/31/2019   TSH 0.28 (L) 09/23/2019   TSH 0.13 (L) 05/30/2019   TSH 0.347 (L) 04/06/2019   TSH 0.324 (L) 12/09/2015   TSH 0.292 (L) 10/19/2013   FREET4 1.0 01/02/2020   FREET4 0.9 09/23/2019   FREET4 0.9 05/30/2019   FREET4 1.04 12/11/2015   FREET4 1.09 10/19/2013      Assessment & Plan:   1. Subclinical hyperthyroidism -Her previsit labs are consistent with improved thyroid profile.  Complications of untreated thyrotoxicosis were discussed in detail with the patient.  She has benefited from low-dose methimazole intervention.  I discussed and lowered her dose to a  level of 2.5 mg p.o. every other day for the next 30 days and discontinue.  She will have repeat thyroid function test and office visit in 4 months.    -Her pulse rate is controlled at 69, did not require beta-blockers.  - she is advised to maintain close follow up with Shelly [name], Shelly Kaufmann, Shelly [name] for primary care needs.       - Time spent on this patient care encounter:  20 minutes of which 50% was spent in  counseling and the rest reviewing  her current and  previous labs / studies and medications  doses and developing a plan for long term care. Shelly [name]  participated in the discussions, expressed understanding, and voiced agreement with the above plans.  All questions were answered to  her satisfaction. she is encouraged to contact clinic should she have any questions or concerns prior to her return visit.   Follow up plan: Return in about 4 months (around 05/06/2020) for F/U with Pre-visit Labs, [place] with Shelly [name].   Glade Lloyd, Shelly [name] [facility] [address] [place], [place] [address] Phone: [phone]  Fax: [phone]     01/05/2020, 10:30 PM  This note was partially dictated with voice recognition software. Similar sounding words can be transcribed inadequately or may not  be corrected upon review.

## 2020-01-06 ENCOUNTER — Ambulatory Visit (HOSPITAL_COMMUNITY)
Admission: RE | Admit: 2020-01-06 | Discharge: 2020-01-06 | Disposition: A | Discharge status: Home / Self Care | Payer: Medicare Other | Source: Ambulatory Visit | Attending: Nephrology | Admitting: Nephrology

## 2020-01-06 DIAGNOSIS — D638 Anemia in other chronic diseases classified elsewhere: Secondary | ICD-10-CM | POA: Insufficient documentation

## 2020-01-06 DIAGNOSIS — Z79899 Other long term (current) drug therapy: Secondary | ICD-10-CM

## 2020-01-06 DIAGNOSIS — N1832 Chronic kidney disease, stage 3b: Secondary | ICD-10-CM | POA: Diagnosis not present

## 2020-01-06 DIAGNOSIS — I10 Essential (primary) hypertension: Secondary | ICD-10-CM

## 2020-01-06 DIAGNOSIS — N189 Chronic kidney disease, unspecified: Secondary | ICD-10-CM | POA: Diagnosis not present

## 2020-01-12 DIAGNOSIS — M79676 Pain in unspecified toe(s): Secondary | ICD-10-CM | POA: Diagnosis not present

## 2020-01-12 DIAGNOSIS — L84 Corns and callosities: Secondary | ICD-10-CM | POA: Diagnosis not present

## 2020-01-12 DIAGNOSIS — I70203 Unspecified atherosclerosis of native arteries of extremities, bilateral legs: Secondary | ICD-10-CM | POA: Diagnosis not present

## 2020-01-12 DIAGNOSIS — B351 Tinea unguium: Secondary | ICD-10-CM | POA: Diagnosis not present

## 2020-01-23 NOTE — Progress Notes (Signed)
Cardiology Office Note  Date: 01/24/2020   ID: Shelly [name], DOB 10-Jan-1941, MRN [id number]  PCP:  [name], [name], MD  Cardiologist:  No primary care provider on file. Electrophysiologist:  None   Chief Complaint: Bilateral lower extremity edema, CAD,  History of Present Illness: Shelly [name] is a 79 y.o. female with a history of CAD status post NSTEMI stenting to proximal to mid RCA 2015.  Moderate residual CAD, LE edema, HLD, CKD stage III, HTN, HLD.  Last saw [name] 07/22/2019.  Her CAD was stable.  No recurrent anginal symptoms since stenting.  Tolerating Plavix.  Continuing beta-blocker and rosuvastatin.   She did not tolerate high intensity statin medication.  She was tolerating Crestor 5 mg.  Goal of LDL less than 70.  She was having continuing bilateral lower extremity edema.  A follow-up echocardiogram was ordered to evaluate cardiac structure and function.  Lasix 40 mg as needed was ordered.  She was advised to weigh herself daily and if weight increased by 3 pounds in 24 hours she could take a dose of Lasix.  She was using compression stockings.  She was advised to elevate her legs at home.       Past Medical History:  Diagnosis Date   Allergy    Anemia    GI bleed   Anginal pain ([place])    Anxiety    Arthritis    Blood transfusion without reported diagnosis    Cataract    Chronic kidney disease    Coronary artery disease    SEVERE   GERD (gastroesophageal reflux disease)    GI bleed 12/2013   Goiter    Hyperlipidemia    Hypertension    Myocardial infarct [facility])    Myocardial infarction ([place]) 08/2013   Neuralgia of right lower extremity 07/13/2017   Pancolonic diverticulosis 12/2013   Post herpetic neuralgia     Past Surgical History:  Procedure Laterality Date   CARPAL TUNNEL RELEASE Bilateral    COLONOSCOPY N/A 01/13/2014   Dr. Hung:pandiverticulosis    COLONOSCOPY N/A 12/14/2015   Dr. Michail Sermon: pancolonic  diverticulosis, internal hemorrhoids    CORNEAL TRANSPLANT     CORONARY ANGIOPLASTY  08/2013   ESOPHAGOGASTRODUODENOSCOPY N/A 12/14/2015   Dr. Michail Sermon: normal    GIVENS CAPSULE STUDY N/A 08/13/2016   Dr. Oneida Alar: enteritis due to aspirin.    HEEL SPUR EXCISION     LEFT HEART CATHETERIZATION WITH CORONARY ANGIOGRAM N/A 08/23/2013   Procedure: LEFT HEART CATHETERIZATION WITH CORONARY ANGIOGRAM;  Surgeon: Peter M Martinique, MD;  Location: [facility] CATH LAB;  Service: Cardiovascular;  Laterality: N/A;   PERCUTANEOUS CORONARY STENT INTERVENTION (PCI-S) N/A 08/24/2013   Procedure: PERCUTANEOUS CORONARY STENT INTERVENTION (PCI-S);  Surgeon: Peter M Martinique, MD;  Location: [facility] CATH LAB;  Service: Cardiovascular;  Laterality: N/A;   TONSILLECTOMY     TUBAL LIGATION      Current Outpatient Medications  Medication Sig Dispense Refill   Capsaicin 0.1 % CREA Apply 1 application topically 4 (four) times daily as needed. 60 g 5   cetirizine (ZYRTEC) 10 MG tablet Take 10 mg by mouth daily.     chlorthalidone (HYGROTON) 25 MG tablet TAKE ONE TABLET BY MOUTH DAILY. 30 tablet 2   clopidogrel (PLAVIX) 75 MG tablet TAKE ONE TABLET BY MOUTH DAILY 90 tablet 3   clotrimazole-betamethasone (LOTRISONE) cream APPLY AS DIRECTED TWICE DAILY. 45 g 2   furosemide (LASIX) 40 MG tablet Take 1 tablet (40 mg total) by mouth  as needed for edema (weight gain). 30 tablet 6   lisinopril (ZESTRIL) 40 MG tablet TAKE ONE TABLET BY MOUTH DAILY. 90 tablet 0   methimazole (TAPAZOLE) 5 MG tablet Take 0.5 tablets (2.5 mg total) by mouth daily. 15 tablet 3   Multiple Vitamin (MULTIVITAMIN) tablet Take 1 tablet by mouth daily.     pantoprazole (PROTONIX) 40 MG tablet Take 1 tablet (40 mg total) by mouth daily. 30 minutes before breakfast 90 tablet 3   pregabalin (LYRICA) 100 MG capsule Take 1 capsule (100 mg total) by mouth at bedtime as needed. Patient not sure of strength 30 capsule 5   rosuvastatin (CRESTOR) 5 MG tablet TAKE ONE  TABLET BY MOUTH DAILY. 90 tablet 3   metoprolol succinate (TOPROL XL) 50 MG 24 hr tablet Take 1 tablet (50 mg total) by mouth daily. Take with or immediately following a meal. 90 tablet 2   No current facility-administered medications for this visit.   Allergies:  Nsaids and Atorvastatin   Social History: The patient  reports that she has never smoked. She has never used smokeless tobacco. She reports that she does not drink alcohol and does not use drugs.   Family History: The patient's family history includes Anemia in her daughter; Arthritis in her daughter and daughter; Asthma in her sister; COPD in her sister; Cancer in her daughter, daughter, and mother; Diverticulitis in her daughter; Early death in her brother and brother; GER disease in her son; GI problems in her son; Hernia in her son; Hypertension in her brother; Liver cancer in her daughter; Other in her father; Pancreatic disease in her daughter; Stroke in her sister and sister; Transient ischemic attack in her brother.   ROS:  Please see the history of present illness. Otherwise, complete review of systems is positive for none.  All other systems are reviewed and negative.   Physical Exam: VS:  BP 140/70    Pulse 76    Ht 5\' 4"  (1.626 m)    Wt 255 lb 12.8 oz (116 kg)    SpO2 98%    BMI 43.91 kg/m , BMI Body mass index is 43.91 kg/m.  Wt Readings from Last 3 Encounters:  01/24/20 255 lb 12.8 oz (116 kg)  01/05/20 255 lb (115.7 kg)  11/30/19 256 lb 6.4 oz (116.3 kg)    General: Morbidly obese patient appears comfortable at rest. Neck: Supple, no elevated JVP or carotid bruits, no thyromegaly. Lungs: Clear to auscultation, nonlabored breathing at rest. Cardiac: Regular rate and rhythm, no S3 or significant systolic murmur, no pericardial rub. Extremities: No pitting edema, distal pulses 2+. Skin: Warm and dry. Musculoskeletal: No kyphosis. Neuropsychiatric: Alert and oriented x3, affect grossly appropriate.  ECG:     Recent Labwork: 10/31/2019: ALT 9; AST 11; BUN 33; Creatinine, Ser 1.62; Potassium 5.0; Sodium 143 11/28/2019: Hemoglobin 10.3; Platelets 271 01/02/2020: TSH 0.55     Component Value Date/Time   CHOL 168 04/06/2019 1602   TRIG 72 04/06/2019 1602   HDL 53 04/06/2019 1602   CHOLHDL 3.2 04/06/2019 1602   CHOLHDL 2.6 11/20/2016 0854   VLDL 11 11/20/2016 0854   LDLCALC 101 (H) 04/06/2019 1602    Other Studies Reviewed Today:  Echocardiogram 08/31/2019  1. Left ventricular ejection fraction, by estimation, is 65 to 70%. The left ventricle has hyperdynamic function. The left ventricle has no regional wall motion abnormalities. Left ventricular diastolic parameters are consistent with Grade I diastolic dysfunction (impaired relaxation). 2. Right ventricular systolic function is  normal. The right ventricular size is normal. There is moderately elevated pulmonary artery systolic pressure. 3. The mitral valve is normal in structure. Mild mitral valve regurgitation. No evidence of mitral stenosis. 4. The aortic valve is tricuspid. Aortic valve regurgitation is not visualized. No aortic stenosis is present. 5. The inferior vena cava is normal in size with greater than 50% respiratory variability, suggesting right atrial pressure of 3 mmHg. Comparison(s): Echocardiogram done 12/10/15 showed an EF of 60%.  Assessment and Plan:  1. CAD in native artery   2. Essential hypertension   3. Hyperlipidemia LDL goal <70   4. Bilateral leg edema    1. CAD in native artery No anginal or exertional symptoms.  Continue Plavix 75 mg daily.  Cannot take aspirin due to bleeding.  2. Essential hypertension Blood pressure elevated on arrival today.  2 previous blood pressures on 11/30/2019 and 01/05/2020 were both elevated as well.  Increase Toprol to 50 mg daily.  Take blood pressures for 2 weeks after starting increased dose.  Call in 2 weeks with blood pressure results.  Continue lisinopril 40 mg daily.   Continue chlorthalidone 25 mg daily.   3. Hyperlipidemia LDL goal <70 Last lipid panel 04/06/2019: TC 168, TG 72, HDL 53, LDL 101.  Continue Crestor 5 mg daily.  4. Bilateral leg edema No edema noted today on exam.  Continue Lasix 40 mg as needed for lower extremity swelling.  5. CKD III  PCP recently ordered lab work 2 months ago which demonstrated worsening renal function was creatinine of 1.62 and GFR of 35.  She was referred to nephrology  6.  Syncopal episodes Patient states she had some recent syncopal episodes which were attributed to Lyrica.  She states she had passed out on higher doses of Lyrica.  She states her most recent syncopal episode was on August 5.  She denied any palpitations.  She stated she did feel some mild dizziness prior.  No further syncopal episodes.  She denied any palpitations or arrhythmias prior to the episode.  She has decreased her Lyrica to 100 mg at bedtime as needed.  She has an empty bottle with 50 mg as the dosage.  Medication Adjustments/Labs and Tests Ordered: Current medicines are reviewed at length with the patient today.  Concerns regarding medicines are outlined above.   Disposition: Follow-up with Dr. Harl Bowie or APP 3 months  Signed, Levell July, NP 01/24/2020 4:53 PM    [facility] [place], [place], [place] [address] Phone: [phone]; Fax: [phone]

## 2020-01-24 ENCOUNTER — Encounter: Payer: Self-pay | Admitting: Family Medicine

## 2020-01-24 ENCOUNTER — Ambulatory Visit: Payer: Medicare Other | Admitting: Family Medicine

## 2020-01-24 VITALS — BP 140/70 | HR 76 | Ht 64.0 in | Wt 255.8 lb

## 2020-01-24 DIAGNOSIS — I2510 Atherosclerotic heart disease of native coronary artery without angina pectoris: Secondary | ICD-10-CM

## 2020-01-24 DIAGNOSIS — I10 Essential (primary) hypertension: Secondary | ICD-10-CM

## 2020-01-24 DIAGNOSIS — R600 Localized edema: Secondary | ICD-10-CM | POA: Diagnosis not present

## 2020-01-24 DIAGNOSIS — E785 Hyperlipidemia, unspecified: Secondary | ICD-10-CM | POA: Diagnosis not present

## 2020-01-24 MED ORDER — METOPROLOL SUCCINATE ER 50 MG PO TB24: 50.0000 mg | ORAL_TABLET | Freq: Every day | ORAL | 2 refills | Status: DC

## 2020-01-24 NOTE — Patient Instructions (Addendum)
Medication Instructions:   Your physician has recommended you make the following change in your medication:   Increase metoprolol succinate to 50 mg daily. You may take (2) of your 25 mg tablets daily until finished  Continue other medications the same  Labwork:  None  Testing/Procedures:  None  Follow-Up:  Your physician recommends that you schedule a follow-up appointment in: 3 months.  Any Other Special Instructions Will Be Listed Below (If Applicable).  Please call our office in 2 weeks with your blood pressure readings Your physician has requested that you regularly monitor and record your blood pressure readings at home daily. Please use the same machine at the same time of day to check your readings and record them.  If you need a refill on your cardiac medications before your next appointment, please call your pharmacy.

## 2020-01-26 ENCOUNTER — Ambulatory Visit: Payer: Medicare Other | Admitting: Cardiovascular Disease

## 2020-01-26 DIAGNOSIS — I5032 Chronic diastolic (congestive) heart failure: Secondary | ICD-10-CM | POA: Diagnosis not present

## 2020-01-26 DIAGNOSIS — D638 Anemia in other chronic diseases classified elsewhere: Secondary | ICD-10-CM | POA: Diagnosis not present

## 2020-01-26 DIAGNOSIS — I129 Hypertensive chronic kidney disease with stage 1 through stage 4 chronic kidney disease, or unspecified chronic kidney disease: Secondary | ICD-10-CM | POA: Diagnosis not present

## 2020-01-26 DIAGNOSIS — N1832 Chronic kidney disease, stage 3b: Secondary | ICD-10-CM | POA: Diagnosis not present

## 2020-01-31 ENCOUNTER — Encounter: Payer: Self-pay | Admitting: Family Medicine

## 2020-01-31 ENCOUNTER — Ambulatory Visit (INDEPENDENT_AMBULATORY_CARE_PROVIDER_SITE_OTHER): Payer: Medicare Other | Admitting: Family Medicine

## 2020-01-31 ENCOUNTER — Other Ambulatory Visit: Payer: Self-pay

## 2020-01-31 VITALS — BP 142/68 | HR 72 | Temp 98.6°F | Ht 64.0 in | Wt 250.0 lb

## 2020-01-31 DIAGNOSIS — E0590 Thyrotoxicosis, unspecified without thyrotoxic crisis or storm: Secondary | ICD-10-CM

## 2020-01-31 DIAGNOSIS — I10 Essential (primary) hypertension: Secondary | ICD-10-CM | POA: Diagnosis not present

## 2020-01-31 DIAGNOSIS — K219 Gastro-esophageal reflux disease without esophagitis: Secondary | ICD-10-CM

## 2020-01-31 DIAGNOSIS — S39012D Strain of muscle, fascia and tendon of lower back, subsequent encounter: Secondary | ICD-10-CM

## 2020-01-31 DIAGNOSIS — N1832 Chronic kidney disease, stage 3b: Secondary | ICD-10-CM

## 2020-01-31 DIAGNOSIS — S39012A Strain of muscle, fascia and tendon of lower back, initial encounter: Secondary | ICD-10-CM

## 2020-01-31 MED ORDER — METHYLPREDNISOLONE ACETATE 80 MG/ML IJ SUSP
80.0000 mg | Freq: Once | INTRAMUSCULAR | Status: AC
  Administered 2020-02-02: 80 mg via INTRAMUSCULAR

## 2020-01-31 NOTE — Progress Notes (Signed)
BP (!) 142/68    Pulse 72    Temp 98.6 F (37 C)    Ht $R'5\' 4"'iH$  (1.626 m)    Wt 250 lb (113.4 kg)    SpO2 98%    BMI 42.91 kg/m    Subjective:   Patient ID: Shelly [name], female    DOB: 02/25/1941, 79 y.o.   MRN: [id number]  HPI: Shelly [name] is a 79 y.o. female presenting on 01/31/2020 for Medical Management of Chronic Issues   HPI Hypertension Patient is currently on chlorthalidone and furosemide and lisinopril metoprolol, and their blood pressure today is 142/68. Patient denies any lightheadedness or dizziness. Patient denies headaches, blurred vision, chest pains, shortness of breath, or weakness. Denies any side effects from medication and is content with current medication.   GERD Patient is currently on pantoprazole.  She denies any major symptoms or abdominal pain or belching or burping. She denies any blood in her stool or lightheadedness or dizziness.   Hyperthyroidism recheck Patient is coming in for thyroid recheck today as well. They deny any issues with hair changes or heat or cold problems or diarrhea or constipation. They deny any chest pain or palpitations. They are currently on methimazole and sees endocrinology for this.  Recheck of CKD stage III, patient is making urine denies any major issues with that.   Relevant past medical, surgical, family and social history reviewed and updated as indicated. Interim medical history since our last visit reviewed. Allergies and medications reviewed and updated.  Review of Systems  Constitutional: Negative for chills and fever.  Eyes: Negative for visual disturbance.  Respiratory: Negative for chest tightness and shortness of breath.   Cardiovascular: Negative for chest pain and leg swelling.  Musculoskeletal: Positive for arthralgias and back pain. Negative for gait problem.  Skin: Negative for rash.  Neurological: Negative for light-headedness and headaches.  Psychiatric/Behavioral: Negative for agitation and  behavioral problems.  All other systems reviewed and are negative.   Per HPI unless specifically indicated above   Allergies as of 01/31/2020      Reactions   Nsaids Other (See Comments)   AVOID all NSAID due to history of GI bleeding   Atorvastatin Other (See Comments)   Joint & muscle pain      Medication List       Accurate as of January 31, 2020  2:48 PM. If you have any questions, ask your nurse or doctor.        Capsaicin 0.1 % Crea Apply 1 application topically 4 (four) times daily as needed.   cetirizine 10 MG tablet Commonly known as: ZYRTEC Take 10 mg by mouth daily.   chlorthalidone 25 MG tablet Commonly known as: HYGROTON TAKE ONE TABLET BY MOUTH DAILY.   clopidogrel 75 MG tablet Commonly known as: PLAVIX TAKE ONE TABLET BY MOUTH DAILY   clotrimazole-betamethasone cream Commonly known as: LOTRISONE APPLY AS DIRECTED TWICE DAILY.   furosemide 40 MG tablet Commonly known as: Lasix Take 1 tablet (40 mg total) by mouth as needed for edema (weight gain).   lisinopril 40 MG tablet Commonly known as: ZESTRIL TAKE ONE TABLET BY MOUTH DAILY.   methimazole 5 MG tablet Commonly known as: TAPAZOLE Take 0.5 tablets (2.5 mg total) by mouth daily.   metoprolol succinate 50 MG 24 hr tablet Commonly known as: Toprol XL Take 1 tablet (50 mg total) by mouth daily. Take with or immediately following a meal.   multivitamin tablet Take 1 tablet by mouth  daily.   pantoprazole 40 MG tablet Commonly known as: PROTONIX Take 1 tablet (40 mg total) by mouth daily. 30 minutes before breakfast   pregabalin 100 MG capsule Commonly known as: Lyrica Take 1 capsule (100 mg total) by mouth at bedtime as needed. Patient not sure of strength   rosuvastatin 5 MG tablet Commonly known as: CRESTOR TAKE ONE TABLET BY MOUTH DAILY.        Objective:   BP (!) 142/68    Pulse 72    Temp 98.6 F (37 C)    Ht $R'5\' 4"'TC$  (1.626 m)    Wt 250 lb (113.4 kg)    SpO2 98%    BMI  42.91 kg/m   Wt Readings from Last 3 Encounters:  01/31/20 250 lb (113.4 kg)  01/24/20 255 lb 12.8 oz (116 kg)  01/05/20 255 lb (115.7 kg)    Physical Exam Vitals and nursing note reviewed.  Constitutional:      General: She is not in acute distress.    Appearance: She is well-developed. She is not diaphoretic.  Eyes:     Conjunctiva/sclera: Conjunctivae normal.  Cardiovascular:     Rate and Rhythm: Normal rate and regular rhythm.     Heart sounds: Normal heart sounds. No murmur heard.   Pulmonary:     Effort: Pulmonary effort is normal. No respiratory distress.     Breath sounds: Normal breath sounds. No wheezing.  Musculoskeletal:        General: No tenderness. Normal range of motion.  Skin:    General: Skin is warm and dry.     Findings: No rash.  Neurological:     Mental Status: She is alert and oriented to person, place, and time.     Coordination: Coordination normal.  Psychiatric:        Behavior: Behavior normal.       Assessment & Plan:   Problem List Items Addressed This Visit      Cardiovascular and Mediastinum   Essential hypertension   Relevant Orders   CMP14+EGFR     Digestive   GERD (gastroesophageal reflux disease)   Relevant Orders   CBC with Differential/Platelet     Endocrine   Subclinical hyperthyroidism - Primary   Relevant Orders   TSH     Genitourinary   CKD (chronic kidney disease), stage III     Other   Morbid obesity ([place])   Relevant Orders   Lipid panel    Other Visit Diagnoses    Lumbar strain, initial encounter       Relevant Medications   methylPREDNISolone acetate (DEPO-MEDROL) injection 80 mg (Start on 01/31/2020  3:00 PM)      Patient's still having a lot of issues with arthralgias and neuropathy, continue the Lyrica with Dr. Brigitte Pulse  We will do Depo-Medrol to help with her back. Follow up plan: Return if symptoms worsen or fail to improve, for Hypertension and hyperthyroidism.  Counseling provided for all of the  vaccine components Orders Placed This Encounter  Procedures   CBC with Differential/Platelet   CMP14+EGFR   Lipid panel   TSH    Shelly Pina, MD [facility] 01/31/2020, 2:48 PM

## 2020-02-01 ENCOUNTER — Telehealth: Payer: Medicare Other

## 2020-02-01 LAB — CBC WITH DIFFERENTIAL/PLATELET
Basophils Absolute: 0 10*3/uL (ref 0.0–0.2)
Basos: 1 %
EOS (ABSOLUTE): 0.1 10*3/uL (ref 0.0–0.4)
Eos: 1 %
Hematocrit: 30.5 % — ABNORMAL LOW (ref 34.0–46.6)
Hemoglobin: 10.3 g/dL — ABNORMAL LOW (ref 11.1–15.9)
Immature Grans (Abs): 0 10*3/uL (ref 0.0–0.1)
Immature Granulocytes: 0 %
Lymphocytes Absolute: 2.4 10*3/uL (ref 0.7–3.1)
Lymphs: 36 %
MCH: 30.6 pg (ref 26.6–33.0)
MCHC: 33.8 g/dL (ref 31.5–35.7)
MCV: 91 fL (ref 79–97)
Monocytes Absolute: 0.7 10*3/uL (ref 0.1–0.9)
Monocytes: 11 %
Neutrophils Absolute: 3.4 10*3/uL (ref 1.4–7.0)
Neutrophils: 51 %
Platelets: 279 10*3/uL (ref 150–450)
RBC: 3.37 x10E6/uL — ABNORMAL LOW (ref 3.77–5.28)
RDW: 13.2 % (ref 11.7–15.4)
WBC: 6.7 10*3/uL (ref 3.4–10.8)

## 2020-02-01 LAB — CMP14+EGFR
ALT: 7 IU/L (ref 0–32)
AST: 11 IU/L (ref 0–40)
Albumin/Globulin Ratio: 1.2 (ref 1.2–2.2)
Albumin: 3.7 g/dL (ref 3.7–4.7)
Alkaline Phosphatase: 93 IU/L (ref 44–121)
BUN/Creatinine Ratio: 14 (ref 12–28)
BUN: 21 mg/dL (ref 8–27)
Bilirubin Total: <0.2 mg/dL (ref 0.0–1.2)
CO2: 21 mmol/L (ref 20–29)
Calcium: 9.4 mg/dL (ref 8.7–10.3)
Chloride: 107 mmol/L — ABNORMAL HIGH (ref 96–106)
Creatinine, Ser: 1.48 mg/dL — ABNORMAL HIGH (ref 0.57–1.00)
GFR calc Af Amer: 39 mL/min/{1.73_m2} — ABNORMAL LOW (ref >59)
GFR calc non Af Amer: 34 mL/min/{1.73_m2} — ABNORMAL LOW (ref >59)
Globulin, Total: 3.1 g/dL (ref 1.5–4.5)
Glucose: 105 mg/dL — ABNORMAL HIGH (ref 65–99)
Potassium: 4.9 mmol/L (ref 3.5–5.2)
Sodium: 141 mmol/L (ref 134–144)
Total Protein: 6.8 g/dL (ref 6.0–8.5)

## 2020-02-01 LAB — LIPID PANEL
Chol/HDL Ratio: 3.9 ratio (ref 0.0–4.4)
Cholesterol, Total: 163 mg/dL (ref 100–199)
HDL: 42 mg/dL (ref >39)
LDL Chol Calc (NIH): 96 mg/dL (ref 0–99)
Triglycerides: 143 mg/dL (ref 0–149)
VLDL Cholesterol Cal: 25 mg/dL (ref 5–40)

## 2020-02-01 LAB — TSH: TSH: 0.384 u[IU]/mL — ABNORMAL LOW (ref 0.450–4.500)

## 2020-02-02 DIAGNOSIS — E0590 Thyrotoxicosis, unspecified without thyrotoxic crisis or storm: Secondary | ICD-10-CM | POA: Diagnosis not present

## 2020-02-02 DIAGNOSIS — S39012D Strain of muscle, fascia and tendon of lower back, subsequent encounter: Secondary | ICD-10-CM | POA: Diagnosis not present

## 2020-02-02 DIAGNOSIS — I10 Essential (primary) hypertension: Secondary | ICD-10-CM | POA: Diagnosis not present

## 2020-02-07 DIAGNOSIS — Z7189 Other specified counseling: Secondary | ICD-10-CM | POA: Diagnosis not present

## 2020-02-07 DIAGNOSIS — B0229 Other postherpetic nervous system involvement: Secondary | ICD-10-CM | POA: Diagnosis not present

## 2020-02-08 ENCOUNTER — Other Ambulatory Visit: Payer: Self-pay | Admitting: Family Medicine

## 2020-02-08 DIAGNOSIS — B0229 Other postherpetic nervous system involvement: Secondary | ICD-10-CM

## 2020-02-13 ENCOUNTER — Ambulatory Visit (INDEPENDENT_AMBULATORY_CARE_PROVIDER_SITE_OTHER): Payer: Medicare Other | Admitting: Family Medicine

## 2020-02-13 ENCOUNTER — Other Ambulatory Visit: Payer: Self-pay

## 2020-02-13 ENCOUNTER — Ambulatory Visit (INDEPENDENT_AMBULATORY_CARE_PROVIDER_SITE_OTHER): Payer: Medicare Other

## 2020-02-13 ENCOUNTER — Encounter: Payer: Self-pay | Admitting: Family Medicine

## 2020-02-13 VITALS — BP 154/73 | HR 70 | Temp 97.8°F | Ht 64.0 in | Wt 248.5 lb

## 2020-02-13 DIAGNOSIS — M1712 Unilateral primary osteoarthritis, left knee: Secondary | ICD-10-CM | POA: Diagnosis not present

## 2020-02-13 DIAGNOSIS — B0229 Other postherpetic nervous system involvement: Secondary | ICD-10-CM

## 2020-02-13 MED ORDER — DICLOFENAC SODIUM 75 MG PO TBEC: 75.0000 mg | DELAYED_RELEASE_TABLET | Freq: Two times a day (BID) | ORAL | 0 refills | Status: DC

## 2020-02-13 MED ORDER — PREGABALIN 100 MG PO CAPS: 100.0000 mg | ORAL_CAPSULE | Freq: Every evening | ORAL | 5 refills | Status: DC | PRN

## 2020-02-13 MED ORDER — METHYLPREDNISOLONE ACETATE 80 MG/ML IJ SUSP
80.0000 mg | Freq: Once | INTRAMUSCULAR | Status: AC
  Administered 2020-02-13: 80 mg via INTRA_ARTICULAR

## 2020-02-13 NOTE — Addendum Note (Signed)
Addended by: Gwenlyn Perking on: 02/13/2020 05:09 PM   Modules accepted: Orders

## 2020-02-13 NOTE — Progress Notes (Addendum)
Subjective: CC: left knee pain PCP: [name], [name], MD  Shelly [name] is a 79 y.o. female presenting to clinic today for:  1. Left knee pain Shelly [name] has a Hx of OA in her left knee that has been getting worse. The knee feels stiff in the morning. The pain worsens with activity. She denies recent injury or fall.  Denies fever, erythema or warmth. She denies fall or injury. She denies numbness or tingling in her feet. She previously take Voltaren gel that did help. She was previously taking hydrocodone, but stopped because it was no longer helping. She has had steroid injections in the past that help for a short period, but she has not had in over 3 months. She has tried PT in the past without improvement.  2. Postherpetic neuralgia Shelly [name] has been taking lyric for shingles. She reports that it is the only thing that has taken the edge off. She would like a refill today. She denies side effects.   Relevant past medical, surgical, family, and social history reviewed and updated as indicated.  Allergies and medications reviewed and updated.  Allergies  Allergen Reactions  . Nsaids Other (See Comments)    AVOID all NSAID due to history of GI bleeding  . Atorvastatin Other (See Comments)    Joint & muscle pain   Past Medical History:  Diagnosis Date  . Allergy   . Anemia    GI bleed  . Anginal pain ([place])   . Anxiety   . Arthritis   . Blood transfusion without reported diagnosis   . Cataract   . Chronic kidney disease   . Coronary artery disease    SEVERE  . GERD (gastroesophageal reflux disease)   . GI bleed 12/2013  . Goiter   . Hyperlipidemia   . Hypertension   . Myocardial infarct ([place])   . Myocardial infarction ([place]) 08/2013  . Neuralgia of right lower extremity 07/13/2017  . Pancolonic diverticulosis 12/2013  . Post herpetic neuralgia     Current Outpatient Medications:  .  Capsaicin 0.1 % CREA, Apply 1 application topically 4 (four) times daily as  needed., Disp: 60 g, Rfl: 5 .  cetirizine (ZYRTEC) 10 MG tablet, Take 10 mg by mouth daily., Disp: , Rfl:  .  chlorthalidone (HYGROTON) 25 MG tablet, TAKE ONE TABLET BY MOUTH DAILY., Disp: 30 tablet, Rfl: 2 .  clopidogrel (PLAVIX) 75 MG tablet, TAKE ONE TABLET BY MOUTH DAILY, Disp: 90 tablet, Rfl: 3 .  clotrimazole-betamethasone (LOTRISONE) cream, APPLY AS DIRECTED TWICE DAILY., Disp: 45 g, Rfl: 2 .  furosemide (LASIX) 40 MG tablet, Take 1 tablet (40 mg total) by mouth as needed for edema (weight gain)., Disp: 30 tablet, Rfl: 6 .  lisinopril (ZESTRIL) 40 MG tablet, TAKE ONE TABLET BY MOUTH DAILY., Disp: 90 tablet, Rfl: 0 .  methimazole (TAPAZOLE) 5 MG tablet, Take 0.5 tablets (2.5 mg total) by mouth daily., Disp: 15 tablet, Rfl: 3 .  metoprolol succinate (TOPROL XL) 50 MG 24 hr tablet, Take 1 tablet (50 mg total) by mouth daily. Take with or immediately following a meal., Disp: 90 tablet, Rfl: 2 .  Multiple Vitamin (MULTIVITAMIN) tablet, Take 1 tablet by mouth daily., Disp: , Rfl:  .  pantoprazole (PROTONIX) 40 MG tablet, Take 1 tablet (40 mg total) by mouth daily. 30 minutes before breakfast, Disp: 90 tablet, Rfl: 3 .  pregabalin (LYRICA) 100 MG capsule, Take 1 capsule (100 mg total) by mouth at bedtime as needed. Patient not  sure of strength, Disp: 30 capsule, Rfl: 5 .  rosuvastatin (CRESTOR) 5 MG tablet, TAKE ONE TABLET BY MOUTH DAILY., Disp: 90 tablet, Rfl: 3 Social History   Socioeconomic History  . Marital status: Divorced    Spouse name: Not on file  . Number of children: 8  . Years of education: 33  . Highest education level: 11th grade  Occupational History  . 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 Smoking status: Never Smoker  . Smokeless tobacco: Never Used  Vaping Use  . Vaping Use: Never used  Substance and Sexual Activity  . Alcohol use: No    Alcohol/week: 0.0 standard drinks  . Drug use: No  . Sexual activity: Not Currently     Birth control/protection: Surgical    Comment: divorced, lives with daughter and granddaughters  Other Topics Concern  . 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   No daily caffeine use.   Social Determinants of Health   Financial Resource Strain: Low Risk   . Difficulty of Paying Living Expenses: Not very hard  Food Insecurity: No Food Insecurity  . Worried About Charity fundraiser in the Last Year: Never true  . Ran Out of Food in the Last Year: Never true  Transportation Needs: No Transportation Needs  . Lack of Transportation (Medical): No  . Lack of Transportation (Non-Medical): No  Physical Activity: Inactive  . Days of Exercise per Week: 0 days  . Minutes of Exercise per Session: 0 min  Stress: No Stress Concern Present  . Feeling of Stress : Not at all  Social Connections: Moderately Integrated  . Frequency of Communication with Friends and Family: More than three times a week  . Frequency of Social Gatherings with Friends and Family: More than three times a week  . Attends Religious Services: More than 4 times per year  . Active Member of Clubs or Organizations: Yes  . Attends Archivist Meetings: More than 4 times per year  . Marital Status: Divorced  Human resources officer Violence: Not At Risk  . Fear of Current or Ex-Partner: No  . Emotionally Abused: No  . Physically Abused: No  . Sexually Abused: No   Family History  Problem Relation Age of Onset  . Hypertension Brother   . Transient ischemic attack Brother   . Cancer Mother        unsure of type   . Other Father        grangrene   . Asthma Sister   . Stroke Sister   . COPD Sister   . Stroke Sister   . Early death Brother   . Early death Brother   . Liver cancer Daughter   . Cancer Daughter   . Hernia Son        Umbilical  . GI problems Son   . Pancreatic disease  Daughter        pancreatectomy  . Cancer Daughter   . Diverticulitis Daughter   . Arthritis Daughter        knee   . Arthritis Daughter        shoulder   . Anemia Daughter   . GER disease Son     Review of Systems  Constitutional: Negative for fatigue and fever.  Respiratory: Negative for shortness of breath.   Cardiovascular: Negative for chest pain.  Musculoskeletal: Positive for arthralgias (left knee).  Skin: Negative for color change and rash.    Objective: Office vital signs reviewed. BP (!) 154/73   Pulse 70   Temp 97.8 F (36.6 C) (Temporal)   Ht _0  (1.626 m)   Wt 248 lb 8 oz (112.7 kg)   BMI 42.65 kg/m    Physical Examination:  Physical Exam Vitals and nursing note reviewed.  Constitutional:      Appearance: Normal appearance.  Cardiovascular:     Rate and Rhythm: Normal rate and regular rhythm.     Heart sounds: Normal heart sounds. No murmur heard.   Pulmonary:     Effort: Pulmonary effort is normal.     Breath sounds: Normal breath sounds.  Musculoskeletal:        General: Normal range of motion.     Right lower leg: No edema.     Left lower leg: Normal. No swelling, deformity, lacerations, tenderness or bony tenderness. No edema.  Skin:    General: Skin is warm and dry.  Neurological:     Mental Status: She is alert.     Joint Injection/Arthrocentesis  Date/Time: 02/13/2020 4:59 PM Performed by: Gwenlyn Perking, FNP Authorized by: Gwenlyn Perking, FNP  Indications: pain  Body area: knee Joint: left knee Local anesthesia used: yes  Anesthesia: Local anesthesia used: yes Local Anesthetic: lidocaine 2% without epinephrine  Sedation: Patient sedated: no  Needle size: 18 G Ultrasound guidance: no Approach: lateral Methylprednisolone amount: 80 mg Patient tolerance: patient tolerated the procedure well with no immediate complications    Results for orders placed or performed in visit on 01/31/20  CBC with Differential/Platelet   Result Value Ref Range   WBC 6.7 3.4 - 10.8 x10E3/uL   RBC 3.37 (L) 3.77 - 5.28 x10E6/uL   Hemoglobin 10.3 (L) 11.1 - 15.9 g/dL   Hematocrit 30.5 (L) 34.0 - 46.6 %   MCV 91 79 - 97 fL   MCH 30.6 26.6 - 33.0 pg   MCHC 33.8 31 - 35 g/dL   RDW 13.2 11.7 - 15.4 %   Platelets 279 150 - 450 x10E3/uL   Neutrophils 51 Not Estab. %   Lymphs 36 Not Estab. %   Monocytes 11 Not Estab. %   Eos 1 Not Estab. %   Basos 1 Not Estab. %   Neutrophils Absolute 3.4 1 - 7 x10E3/uL   Lymphocytes Absolute 2.4 0 - 3 x10E3/uL   Monocytes Absolute 0.7 0 - 0 x10E3/uL   EOS (ABSOLUTE) 0.1 0.0 - 0.4 x10E3/uL   Basophils Absolute 0.0 0 - 0 x10E3/uL   Immature Granulocytes 0 Not Estab. %   Immature Grans (Abs) 0.0 0.0 - 0.1 x10E3/uL  CMP14+EGFR  Result Value Ref Range   Glucose 105 (H) 65 - 99 mg/dL   BUN 21 8 - 27 mg/dL   Creatinine, Ser 1.48 (H) 0.57 - 1.00 mg/dL   GFR calc non Af Amer 34 (L) >59 mL/min/1.73   GFR calc Af Amer 39 (L) >59 mL/min/1.73   BUN/Creatinine Ratio 14 12 - 28   Sodium 141 134 - 144 mmol/L   Potassium 4.9 3.5 - 5.2 mmol/L   Chloride 107 (H) 96 - 106 mmol/L   CO2 21 20 - 29 mmol/L   Calcium 9.4 8.7 - 10.3 mg/dL   Total Protein 6.8 6.0 - 8.5 g/dL   Albumin 3.7 3.7 - 4.7  g/dL   Globulin, Total 3.1 1.5 - 4.5 g/dL   Albumin/Globulin Ratio 1.2 1.2 - 2.2   Bilirubin Total <0.2 0.0 - 1.2 mg/dL   Alkaline Phosphatase 93 44 - 121 IU/L   AST 11 0 - 40 IU/L   ALT 7 0 - 32 IU/L  Lipid panel  Result Value Ref Range   Cholesterol, Total 163 100 - 199 mg/dL   Triglycerides 143 0 - 149 mg/dL   HDL 42 >39 mg/dL   VLDL Cholesterol Cal 25 5 - 40 mg/dL   LDL Chol Calc (NIH) 96 0 - 99 mg/dL   Chol/HDL Ratio 3.9 0.0 - 4.4 ratio  TSH  Result Value Ref Range   TSH 0.384 (L) 0.450 - 4.500 uIU/mL     Assessment/ Plan: Shelly [name] was seen today for knee pain.  Diagnoses and all orders for this visit:  Primary osteoarthritis of left knee Voltaren gel OTC, tylenol. Patient is not interested in  a PT or ortho referral at this time. Xray significant for OA today. Joint injection today in office.  -     DG Knee 1-2 Views Left; Future -     methylPREDNISolone acetate (DEPO-MEDROL) injection 80 mg       -     Joint Injection/Arthrocentesis  Postherpetic neuralgia Discussed with Dr. Warrick Parisian who agreed that refill was acceptable at this time.  -     pregabalin (LYRICA) 100 MG capsule; Take 1 capsule (100 mg total) by mouth at bedtime as needed. Patient not sure of strength  Follow up for new or worsening symptoms.   The above assessment and management plan was discussed with the patient. The patient verbalized understanding of and has agreed to the management plan. Patient is aware to call the clinic if symptoms persist or worsen. Patient is aware when to return to the clinic for a follow-up visit. Patient educated on when it is appropriate to go to the emergency department.   Marjorie Smolder, FNP-C [facility] [address] [place], [place] [address] [phone]

## 2020-02-13 NOTE — Patient Instructions (Signed)
Arthritis °Arthritis is a term that is commonly used to refer to joint pain or joint disease. There are more than 100 types of arthritis. °What are the causes? °The most common cause of this condition is wear and tear of a joint. Other causes include: °· Gout. °· Inflammation of a joint. °· An infection of a joint. °· Sprains and other injuries near the joint. °· A reaction to medicines or drugs, or an allergic reaction. °In some cases, the cause may not be known. °What are the signs or symptoms? °The main symptom of this condition is pain in the joint during movement. Other symptoms include: °· Redness, swelling, or stiffness at a joint. °· Warmth coming from the joint. °· Fever. °· Overall feeling of illness. °How is this diagnosed? °This condition may be diagnosed with a physical exam and tests, including: °· Blood tests. °· Urine tests. °· Imaging tests, such as X-rays, an MRI, or a CT scan. °Sometimes, fluid is removed from a joint for testing. °How is this treated? °This condition may be treated with: °· Treatment of the cause, if it is known. °· Rest. °· Raising (elevating) the joint. °· Applying cold or hot packs to the joint. °· Medicines to improve symptoms and reduce inflammation. °· Injections of a steroid such as cortisone into the joint to help reduce pain and inflammation. °Depending on the cause of your arthritis, you may need to make lifestyle changes to reduce stress on your joint. Changes may include: °· Exercising more. °· Losing weight. °Follow these instructions at home: °Medicines °· Take over-the-counter and prescription medicines only as told by your health care provider. °· Do not take aspirin to relieve pain if your health care provider thinks that gout may be causing your pain. °Activity °· Rest your joint if told by your health care provider. Rest is important when your disease is active and your joint feels painful, swollen, or stiff. °· Avoid activities that make the pain worse. It is  important to balance activity with rest. °· Exercise your joint regularly with range-of-motion exercises as told by your health care provider. Try doing low-impact exercise, such as: °? Swimming. °? Water aerobics. °? Biking. °? Walking. °Managing pain, stiffness, and swelling ° °  ° °· If directed, put ice on the joint. °? Put ice in a plastic bag. °? Place a towel between your skin and the bag. °? Leave the ice on for 20 minutes, 2-3 times per day. °· If your joint is swollen, raise (elevate) it above the level of your heart if directed by your health care provider. °· If your joint feels stiff in the morning, try taking a warm shower. °· If directed, apply heat to the affected area as often as told by your health care provider. Use the heat source that your health care provider recommends, such as a moist heat pack or a heating pad. If you have diabetes, do not apply heat without permission from your health care provider. To apply heat: °? Place a towel between your skin and the heat source. °? Leave the heat on for 20-30 minutes. °? Remove the heat if your skin turns bright red. This is especially important if you are unable to feel pain, heat, or cold. You may have a greater risk of getting burned. °General instructions °· Do not use any products that contain nicotine or tobacco, such as cigarettes, e-cigarettes, and chewing tobacco. If you need help quitting, ask your health care provider. °· Keep   all follow-up visits as told by your health care provider. This is important. °Contact a health care provider if: °· The pain gets worse. °· You have a fever. °Get help right away if: °· You develop severe joint pain, swelling, or redness. °· Many joints become painful and swollen. °· You develop severe back pain. °· You develop severe weakness in your leg. °· You cannot control your bladder or bowels. °Summary °· Arthritis is a term that is commonly used to refer to joint pain or joint disease. There are more than  100 types of arthritis. °· The most common cause of this condition is wear and tear of a joint. Other causes include gout, inflammation or infection of the joint, sprains, or allergies. °· Symptoms of this condition include redness, swelling, or stiffness of the joint. Other symptoms include warmth, fever, or feeling ill. °· This condition is treated with rest, elevation, medicines, and applying cold or hot packs. °· Follow your health care provider's instructions about medicines, activity, exercises, and other home care treatments. °This information is not intended to replace advice given to you by your health care provider. Make sure you discuss any questions you have with your health care provider. °Document Revised: 04/12/2018 Document Reviewed: 04/12/2018 °Elsevier Patient Education © 2020 [employer] ° °

## 2020-02-14 MED ORDER — METHYLPREDNISOLONE ACETATE 40 MG/ML IJ SUSP: 40.0000 mg | Freq: Once | INTRAMUSCULAR | Status: DC

## 2020-02-14 MED ORDER — BUPIVACAINE HCL 0.25 % IJ SOLN: 1.0000 mL | Freq: Once | INTRAMUSCULAR | Status: DC

## 2020-02-14 NOTE — Addendum Note (Signed)
Addended by: Gwenlyn Perking on: 02/14/2020 12:01 PM   Modules accepted: Orders

## 2020-02-15 NOTE — Progress Notes (Signed)
This encounter was created in error - please disregard.

## 2020-02-25 ENCOUNTER — Other Ambulatory Visit: Payer: Self-pay | Admitting: Family Medicine

## 2020-03-01 ENCOUNTER — Ambulatory Visit (INDEPENDENT_AMBULATORY_CARE_PROVIDER_SITE_OTHER): Payer: Medicare Other | Admitting: Licensed Clinical Social Worker

## 2020-03-01 DIAGNOSIS — I2510 Atherosclerotic heart disease of native coronary artery without angina pectoris: Secondary | ICD-10-CM

## 2020-03-01 DIAGNOSIS — F419 Anxiety disorder, unspecified: Secondary | ICD-10-CM

## 2020-03-01 DIAGNOSIS — I10 Essential (primary) hypertension: Secondary | ICD-10-CM

## 2020-03-01 DIAGNOSIS — N1832 Chronic kidney disease, stage 3b: Secondary | ICD-10-CM

## 2020-03-01 DIAGNOSIS — K219 Gastro-esophageal reflux disease without esophagitis: Secondary | ICD-10-CM

## 2020-03-01 DIAGNOSIS — M1712 Unilateral primary osteoarthritis, left knee: Secondary | ICD-10-CM | POA: Diagnosis not present

## 2020-03-01 NOTE — Chronic Care Management (AMB) (Signed)
Chronic Care Management    Clinical Social Work Follow Up Note  03/01/2020 Name: Shelly [name] MRN: [id number] DOB: 1940/07/19  Shelly [name] is a 79 y.o. year old female who is a primary care patient of [name], [name], MD. The CCM team was consulted for assistance with [employer] .   Review of patient status, including review of consultants reports, other relevant assessments, and collaboration with appropriate care team members and the patient's provider was performed as part of comprehensive patient evaluation and provision of chronic care management services.    SDOH (Social Determinants of Health) assessments performed: No;risk for physical inactivity; risk for stress; risk for depression; risk for financial strain; risk for social isolation    Office Visit from 10/31/2019 in [place]  PHQ-9 Total Score 10     GAD 7 : Generalized Anxiety Score 10/31/2019 09/28/2019 08/29/2019  Nervous, Anxious, on Edge 1 1 1   Control/stop worrying 2 2 1   Worry too much - different things 2 2 1   Trouble relaxing 0 0 0  Restless 0 0 0  Easily annoyed or irritable 0 0 0  Afraid - awful might happen 0 0 0  Total GAD 7 Score 5 5 3   Anxiety Difficulty - - Somewhat difficult    Outpatient Encounter Medications as of 03/01/2020  Medication Sig  . Capsaicin 0.1 % CREA Apply 1 application topically 4 (four) times daily as needed.  . cetirizine (ZYRTEC) 10 MG tablet Take 10 mg by mouth daily.  . chlorthalidone (HYGROTON) 25 MG tablet TAKE ONE TABLET BY MOUTH DAILY  . clopidogrel (PLAVIX) 75 MG tablet TAKE ONE TABLET BY MOUTH DAILY  . clotrimazole-betamethasone (LOTRISONE) cream APPLY AS DIRECTED TWICE DAILY.  . furosemide (LASIX) 40 MG tablet Take 1 tablet (40 mg total) by mouth as needed for edema (weight gain).  Marland Kitchen lisinopril (ZESTRIL) 40 MG tablet TAKE ONE TABLET BY MOUTH DAILY.  . methimazole (TAPAZOLE) 5 MG tablet Take 0.5 tablets (2.5 mg total) by mouth  daily.  . metoprolol succinate (TOPROL XL) 50 MG 24 hr tablet Take 1 tablet (50 mg total) by mouth daily. Take with or immediately following a meal.  . Multiple Vitamin (MULTIVITAMIN) tablet Take 1 tablet by mouth daily.  . pantoprazole (PROTONIX) 40 MG tablet Take 1 tablet (40 mg total) by mouth daily. 30 minutes before breakfast  . pregabalin (LYRICA) 100 MG capsule Take 1 capsule (100 mg total) by mouth at bedtime as needed. Patient not sure of strength  . rosuvastatin (CRESTOR) 5 MG tablet TAKE ONE TABLET BY MOUTH DAILY.   No facility-administered encounter medications on file as of 03/01/2020.    Goals    .  Client will talk with LCSW in next 30 days about depression sympotms related to health conditions faced (pt-stated)      CARE PLAN ENTRY   Current Barriers:  . Decreased appetite . Mobility issues in client with Chronic diagnoses of HTN, GERD,Anxiety,CAD, CKD,OA . Grief issues faced  Clinical Social Work Clinical Goal(s):  Marland Kitchen LCSW will talk with client in next 30 days to discuss depression symptoms of client related to her health conditions  Interventions: . Talked with client about CCM services . Talked with client about her upcoming medical appointments . Provided counseling support for client (she experienced the death of one daughter one year ago and experienced death of another daughter 4 years ago) . Encouraged client to talk with RNCM as needed to address nursing issues she faced .  Talked with client about client decreased energy . Talked with client about grief support resources through [facility] . Client and LCSW talked about client's pain issues related to shingles issues she faces . Talked with Anwar about her ambulation (uses a walker to help her walk) . Talked with client about mood of client . Talked with client about support from her two granddaughters . Talked with client about meal provision of client . Talked with client about  sleeping issues of client . Talked with Veyda about her decreased appetite . Talked with client about client completion of ADLs . Talked with client about edema in her legs and ankles . Collaborated with RNCM regarding nursing needs of client  Patient Self Care Activities:   Takes medications as prescribed Attends scheduled medical appointments  . Patient self care deficits: Marland Kitchen Mobility issues   Initial goal documentation    Follow Up Plan: LCSW to call client in next 4 weeks to talk with client about depression symptoms faced by client  Norva Riffle.Vernie Vinciguerra MSW, LCSW Licensed Clinical Social Worker [facility] [phone]

## 2020-03-01 NOTE — Patient Instructions (Addendum)
Licensed Clinical Education officer, museum Visit Information  Goals we discussed today:    Client will talk with LCSW in next 30 days about depression sympotms related to health conditions faced (pt-stated)         CARE PLAN ENTRY   Current Barriers:   Decreased appetite  Mobility issues in client with Chronic diagnoses of HTN, GERD,Anxiety,CAD, CKD,OA  Grief issues faced  Clinical Social Work Clinical Goal(s):   LCSW will talk with client in next 30 days to discuss depression symptoms of client related to her health conditions  Interventions:  Talked with client about CCM services  Talked with client about her upcoming medical appointments  Provided counseling support for client (she experienced the death of one daughter one year ago and experienced death of another daughter 4 years ago)  Encouraged client to talk with RNCM as needed to address nursing issues she faced  Talked with client about client decreased energy  Talked with client about grief support resources through [facility]  Client and LCSW talked about client's pain issues related to shingles issues she faces  Talked with Ticia about her ambulation (uses a walker to help her walk)  Talked with client about mood of client  Talked with client about support from her two granddaughters  Talked with client about meal provision of client  Talked with client about sleeping issues of client  Talked with Tekila about her decreased appetite  Talked with client about client completion of ADLs  Talked with client about edema in her legs and ankles  Collaborated with RNCM regarding nursing needs of client  Patient Self Care Activities:   Takes medications as prescribed Attends scheduled medical appointments   Patient self care deficits:  Mobility issues   Initial goal documentation    Follow Up Plan: LCSW to call client in next 4 weeks to talk with client about depression  symptoms faced by client  Materials Provided: no  The patient verbalized understanding of instructions provided today and declined a print copy of patient instruction materials.   Norva Riffle.Azarie Coriz MSW, LCSW Licensed Clinical Social Worker [facility] [phone]

## 2020-03-06 ENCOUNTER — Encounter: Payer: Self-pay | Admitting: Gastroenterology

## 2020-03-06 ENCOUNTER — Other Ambulatory Visit: Payer: Self-pay

## 2020-03-06 ENCOUNTER — Ambulatory Visit: Payer: Medicare Other | Admitting: Gastroenterology

## 2020-03-06 VITALS — BP 159/74 | HR 79 | Temp 97.1°F | Ht 66.0 in | Wt 251.4 lb

## 2020-03-06 DIAGNOSIS — K219 Gastro-esophageal reflux disease without esophagitis: Secondary | ICD-10-CM | POA: Diagnosis not present

## 2020-03-06 MED ORDER — HYDROCORTISONE (PERIANAL) 2.5 % EX CREA: 1.0000 "application " | TOPICAL_CREAM | Freq: Two times a day (BID) | CUTANEOUS | 1 refills | Status: DC

## 2020-03-06 NOTE — Progress Notes (Signed)
Referring Provider: [name], [name], MD Primary Care Physician:  [name], [name], MD Primary GI: Dr. Eulas Post   Chief Complaint  Patient presents with  . Follow-up    wants medication for hemorrhoids,fu acid reflux, does fine as long as she takes medicine    HPI:   Shelly [name] is a 79 y.o. female presenting today with a history of diverticular bleed several years ago, IDA with capsule study showing enteritis due to aspirin use, chronic GERD.Prefers frequent visits with health care team. Chronic normocytic anemia with chronic diease component.   Protonix every other day. Sometimes will take another dose at bedtime but not often. No dysphagia. No abdominal pain. Feels just a touch of nausea if reflux acts up. No vomiting. If doesn't eat cheerios, bowels don't move as well. Appetite is "raggedy".   Still dealing with postherpetic neuralgia.   Past Medical History:  Diagnosis Date  . Allergy   . Anemia    GI bleed  . Anginal pain ([place])   . Anxiety   . Arthritis   . Blood transfusion without reported diagnosis   . Cataract   . Chronic kidney disease   . Coronary artery disease    SEVERE  . GERD (gastroesophageal reflux disease)   . GI bleed 12/2013  . Goiter   . Hyperlipidemia   . Hypertension   . Myocardial infarct ([place])   . Myocardial infarction ([place]) 08/2013  . Neuralgia of right lower extremity 07/13/2017  . Pancolonic diverticulosis 12/2013  . Post herpetic neuralgia     Past Surgical History:  Procedure Laterality Date  . CARPAL TUNNEL RELEASE Bilateral   . COLONOSCOPY N/A 01/13/2014   Dr. Hung:pandiverticulosis   . COLONOSCOPY N/A 12/14/2015   Dr. Michail Sermon: pancolonic diverticulosis, internal hemorrhoids   . CORNEAL TRANSPLANT    . CORONARY ANGIOPLASTY  08/2013  . ESOPHAGOGASTRODUODENOSCOPY N/A 12/14/2015   Dr. Michail Sermon: normal   . GIVENS CAPSULE STUDY N/A 08/13/2016   Dr. Oneida Alar: enteritis due to aspirin.   Marland Kitchen HEEL SPUR EXCISION    . LEFT HEART  CATHETERIZATION WITH CORONARY ANGIOGRAM N/A 08/23/2013   Procedure: LEFT HEART CATHETERIZATION WITH CORONARY ANGIOGRAM;  Surgeon: Peter M Martinique, MD;  Location: [facility] CATH LAB;  Service: Cardiovascular;  Laterality: N/A;  . PERCUTANEOUS CORONARY STENT INTERVENTION (PCI-S) N/A 08/24/2013   Procedure: PERCUTANEOUS CORONARY STENT INTERVENTION (PCI-S);  Surgeon: Peter M Martinique, MD;  Location: [facility] CATH LAB;  Service: Cardiovascular;  Laterality: N/A;  . TONSILLECTOMY    . TUBAL LIGATION      Current Outpatient Medications  Medication Sig Dispense Refill  . Capsaicin 0.1 % CREA Apply 1 application topically 4 (four) times daily as needed. 60 g 5  . cetirizine (ZYRTEC) 10 MG tablet Take 10 mg by mouth daily.    . chlorthalidone (HYGROTON) 25 MG tablet TAKE ONE TABLET BY MOUTH DAILY 30 tablet 4  . clopidogrel (PLAVIX) 75 MG tablet TAKE ONE TABLET BY MOUTH DAILY 90 tablet 3  . clotrimazole-betamethasone (LOTRISONE) cream APPLY AS DIRECTED TWICE DAILY. 45 g 2  . furosemide (LASIX) 40 MG tablet Take 1 tablet (40 mg total) by mouth as needed for edema (weight gain). 30 tablet 6  . lisinopril (ZESTRIL) 40 MG tablet TAKE ONE TABLET BY MOUTH DAILY. 90 tablet 0  . methimazole (TAPAZOLE) 5 MG tablet Take 0.5 tablets (2.5 mg total) by mouth daily. 15 tablet 3  . metoprolol succinate (TOPROL XL) 50 MG 24 hr tablet Take 1 tablet (50 mg  total) by mouth daily. Take with or immediately following a meal. 90 tablet 2  . Multiple Vitamin (MULTIVITAMIN) tablet Take 1 tablet by mouth daily.    . pantoprazole (PROTONIX) 40 MG tablet Take 1 tablet (40 mg total) by mouth daily. 30 minutes before breakfast 90 tablet 3  . pregabalin (LYRICA) 100 MG capsule Take 1 capsule (100 mg total) by mouth at bedtime as needed. Patient not sure of strength 30 capsule 5  . rosuvastatin (CRESTOR) 5 MG tablet TAKE ONE TABLET BY MOUTH DAILY. 90 tablet 3   No current facility-administered medications for this visit.    Allergies as of 03/06/2020  - Review Complete 03/06/2020  Allergen Reaction Noted  . Nsaids Other (See Comments) 07/25/2016  . Atorvastatin Other (See Comments) 11/30/2018    Family History  Problem Relation Age of Onset  . Hypertension Brother   . Transient ischemic attack Brother   . Cancer Mother        unsure of type   . Other Father        grangrene   . Asthma Sister   . Stroke Sister   . COPD Sister   . Stroke Sister   . Early death Brother   . Early death Brother   . Liver cancer Daughter   . Cancer Daughter   . Hernia Son        Umbilical  . GI problems Son   . Pancreatic disease Daughter        pancreatectomy  . Cancer Daughter   . Diverticulitis Daughter   . Arthritis Daughter        knee   . Arthritis Daughter        shoulder   . Anemia Daughter   . GER disease Son     Social History   Socioeconomic History  . Marital status: Divorced    Spouse name: Not on file  . Number of children: 8  . Years of education: 57  . Highest education level: 11th grade  Occupational History  . 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 Smoking status: Never Smoker  . Smokeless tobacco: Never Used  Vaping Use  . Vaping Use: Never used  Substance and Sexual Activity  . Alcohol use: No    Alcohol/week: 0.0 standard drinks  . Drug use: No  . Sexual activity: Not Currently    Birth control/protection: Surgical    Comment: divorced, lives with daughter and granddaughters  Other Topics Concern  . 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   No daily caffeine use.   Social Determinants of Health   Financial Resource Strain: Low Risk   . Difficulty of Paying Living Expenses: Not very hard  Food Insecurity: No Food Insecurity  . Worried About Charity fundraiser in the Last Year: Never true  . Ran Out of Food in the Last Year: Never  true  Transportation Needs: No Transportation Needs  . Lack of Transportation (Medical): No  . Lack of Transportation (Non-Medical): No  Physical Activity: Inactive  . Days of Exercise per Week: 0 days  . Minutes of Exercise per Session: 0 min  Stress: No Stress Concern Present  . Feeling of Stress : Not at all  Social Connections: Moderately Integrated  . Frequency  of Communication with Friends and Family: More than three times a week  . Frequency of Social Gatherings with Friends and Family: More than three times a week  . Attends Religious Services: More than 4 times per year  . Active Member of Clubs or Organizations: Yes  . Attends Archivist Meetings: More than 4 times per year  . Marital Status: Divorced    Review of Systems: Gen: Denies fever, chills, anorexia. Denies fatigue, weakness, weight loss.  CV: Denies chest pain, palpitations, syncope, peripheral edema, and claudication. Resp: Denies dyspnea at rest, cough, wheezing, coughing up blood, and pleurisy. GI: see HPI Derm: Denies rash, itching, dry skin Psych: Denies depression, anxiety, memory loss, confusion. No homicidal or suicidal ideation.  Heme: Denies bruising, bleeding, and enlarged lymph nodes.  Physical Exam: BP (!) 159/74   Pulse 79   Temp (!) 97.1 F (36.2 C) (Temporal)   Ht 5\' 6"  (1.676 m)   Wt 251 lb 6.4 oz (114 kg)   BMI 40.58 kg/m  General:   Alert and oriented. No distress noted. Pleasant and cooperative.  Head:  Normocephalic and atraumatic. Eyes:  Conjuctiva clear without scleral icterus. Mouth:  Mask in place Abdomen:  +BS, soft, non-tender and non-distended. No rebound or guarding. No HSM or masses noted. Msk:  Symmetrical without gross deformities. Normal posture. Extremities:  Without edema. Neurologic:  Alert and  oriented x4 Psych:  Alert and cooperative. Normal mood and affect.  ASSESSMENT/PLAN: Shelly [name] is a 79 y.o. female presenting today with history of  chronic GERD, IDA in past with enteritis due to aspirin use now resolved and moreso anemia of chronic disease, presenting in routine follow-up.  Taking Protonix for GERD without alarm signs/symptoms. She desires frequent visit with healthcare team, so we will see her in 3 months. Stable at this time without any concerning GI signs/symptoms. She is requesting anusol cream to have on hand but denies any rectal issues at this time. I have sent this to have on hand at pharmacy; she is to call with any concerns.    Annitta Needs, PhD, ANP-BC [facility]

## 2020-03-06 NOTE — Patient Instructions (Signed)
I sent in the rectal cream to have on hand if needed. Try not to use it for more than 7 days in a row.  I will see you in 3 months!  Please call if you have any concerns or questions in the meantime.  I am so sorry for your recent losses. Will be thinking of you!  Annitta Needs, PhD, ANP-BC [facility]

## 2020-03-13 NOTE — Progress Notes (Signed)
Cc'ed to pcp °

## 2020-03-16 ENCOUNTER — Telehealth: Payer: Medicare Other | Admitting: *Deleted

## 2020-03-20 ENCOUNTER — Ambulatory Visit: Payer: Medicare Other | Admitting: *Deleted

## 2020-03-20 ENCOUNTER — Telehealth: Payer: Self-pay | Admitting: *Deleted

## 2020-03-20 DIAGNOSIS — I10 Essential (primary) hypertension: Secondary | ICD-10-CM

## 2020-03-20 DIAGNOSIS — N1832 Chronic kidney disease, stage 3b: Secondary | ICD-10-CM

## 2020-03-20 DIAGNOSIS — B0229 Other postherpetic nervous system involvement: Secondary | ICD-10-CM

## 2020-03-20 NOTE — Telephone Encounter (Signed)
  Creams patient could potentially benefit from, however a bit pricey.  Cash only, not covered under insurance.  I love the last one with the gaba,diclofenac, baclofen,lido etc  [employer]

## 2020-03-20 NOTE — Telephone Encounter (Signed)
Thank you Shelly [name]! I'll talk with the patient and then with Dr [name] if she wants to proceed with an Rx.  Cyril Mourning

## 2020-03-20 NOTE — Patient Instructions (Signed)
Visit Information  Goals Addressed            This Visit's Progress   . "I want to talk to someone about chronic pain"       [place] (see longtitudinal plan of care for additional care plan information)  Feelings of hopelessness/sadness in patient with chronic pain related to post herpetic neuralgia and grief in a patient with CKD, HTN, and hx of GI bleed.  Current Barriers:  Marland Kitchen Knowledge Deficits related to psychosocial support options . Chronic pain . Transportation . Doesn't want to be a burden to her family  Nurse Case Manager Clinical Goal(s):  Marland Kitchen Over the next 90 days, patient will continue to talk with LCSW about psychosocial issues related to chronic pain . Over the next 90 days, patient will continue to talk with RN Care Manager about management of chronic pain  Interventions:  . Chart reviewed including recent office notes and lab reports . Collaborated with LCSW . Talked with patient by telephone about postherpetic neuralgia . Reviewed Medications and discussed Lyrica o Doesn't feel that it has helped pain significantly no matter the dosage tried. Just knocks the edge off slightly. Unable to take during the day because per patient, it made her "pass out" . Reviewed and discussed upcoming appointments . Discussed visits with neurologist, Dr Manuella Ghazi . RN will talk with PharmD regarding possibility of prescribing a compounded topical pain relieving cream o Patient can have granddaughters help with application . Discussed diet and decreased appetite since shingles outbreak and development of postherpetic neuralgia o Skips breakfast, snack for lunch, and regular dinner o Recommended Ensure or Boost o Patient considering yogurt for breakfast. Encouraged greek yogurt with no added sugars and fresh fruit . Encouraged patient to continue talking with LCSW regarding psychosocial issues o Effect of pain on mental health o Recent death of brother and cousin within the past  week . Encouraged patient to reach out to [facility] as needed . Encouraged patient to seek emergency medical care as needed  Patient Self Care Activities:  . Performs ADL's independently . Does not drive and depends on family for transportation  Please see past updates related to this goal by clicking on the "Past Updates" button in the selected goal         Patient verbalizes understanding of instructions provided today.   Follow-up Plan The care management team will reach out to the patient again over the next 30 days.  The patient has been provided with contact information for the care management team and has been advised to call with any health related questions or concerns.   [name], BSN, RN-BC Embedded Chronic Care Manager [facility] / [place] [facility] Direct Dial: [phone]

## 2020-03-20 NOTE — Chronic Care Management (AMB) (Signed)
Chronic Care Management   Follow Up Note   03/20/2020 Name: Shelly [name] MRN: [id number] DOB: May 10, 1941  Referred by: [name], [name], MD Reason for referral : Chronic Care Management (RN follow up)   Shelly [name] is a 79 y.o. year old female who is a primary care patient of [name], [name], MD. The CCM team was consulted for assistance with chronic disease management and care coordination needs.    Review of patient status, including review of consultants reports, relevant laboratory and other test results, and collaboration with appropriate care team members and the patient's provider was performed as part of comprehensive patient evaluation and provision of chronic care management services.    SDOH (Social Determinants of Health) assessments performed: No See Care Plan activities for detailed interventions related to [facility])     Outpatient Encounter Medications as of 03/20/2020  Medication Sig  . Capsaicin 0.1 % CREA Apply 1 application topically 4 (four) times daily as needed.  . cetirizine (ZYRTEC) 10 MG tablet Take 10 mg by mouth daily.  . chlorthalidone (HYGROTON) 25 MG tablet TAKE ONE TABLET BY MOUTH DAILY  . clopidogrel (PLAVIX) 75 MG tablet TAKE ONE TABLET BY MOUTH DAILY  . clotrimazole-betamethasone (LOTRISONE) cream APPLY AS DIRECTED TWICE DAILY.  . furosemide (LASIX) 40 MG tablet Take 1 tablet (40 mg total) by mouth as needed for edema (weight gain).  . hydrocortisone (ANUSOL-HC) 2.5 % rectal cream Place 1 application rectally 2 (two) times daily. As needed for rectal discomfort.  Shelly [name] lisinopril (ZESTRIL) 40 MG tablet TAKE ONE TABLET BY MOUTH DAILY.  . methimazole (TAPAZOLE) 5 MG tablet Take 0.5 tablets (2.5 mg total) by mouth daily.  . metoprolol succinate (TOPROL XL) 50 MG 24 hr tablet Take 1 tablet (50 mg total) by mouth daily. Take with or immediately following a meal.  . Multiple Vitamin (MULTIVITAMIN) tablet Take 1 tablet by mouth daily.  .  pantoprazole (PROTONIX) 40 MG tablet Take 1 tablet (40 mg total) by mouth daily. 30 minutes before breakfast  . pregabalin (LYRICA) 100 MG capsule Take 1 capsule (100 mg total) by mouth at bedtime as needed. Patient not sure of strength  . rosuvastatin (CRESTOR) 5 MG tablet TAKE ONE TABLET BY MOUTH DAILY.   No facility-administered encounter medications on file as of 03/20/2020.     Goals Addressed            This Visit's Progress   . "I want to talk to someone about chronic pain"       Shelly [name] (see longtitudinal plan of care for additional care plan information)  Feelings of hopelessness/sadness in patient with chronic pain related to post herpetic neuralgia and grief in a patient with CKD, HTN, and hx of GI bleed.  Current Barriers:  Shelly [name] Knowledge Deficits related to psychosocial support options . Chronic pain . Transportation . Doesn't want to be a burden to her family  Nurse Case Manager Clinical Goal(s):  Shelly [name] Over the next 90 days, patient will continue to talk with LCSW about psychosocial issues related to chronic pain . Over the next 90 days, patient will continue to talk with RN Care Manager about management of chronic pain  Interventions:  . Chart reviewed including recent office notes and lab reports . Collaborated with LCSW . Talked with patient by telephone about postherpetic neuralgia . Reviewed Medications and discussed Lyrica o Doesn't feel that it has helped pain significantly no matter the dosage tried. Just knocks the edge off slightly.  Unable to take during the day because per patient, it made her "pass out" . Reviewed and discussed upcoming appointments . Discussed visits with neurologist, Dr Manuella Ghazi . RN will talk with PharmD regarding possibility of prescribing a compounded topical pain relieving cream o Patient can have granddaughters help with application . Discussed diet and decreased appetite since shingles outbreak and development of postherpetic  neuralgia o Skips breakfast, snack for lunch, and regular dinner o Recommended Ensure or Boost o Patient considering yogurt for breakfast. Encouraged greek yogurt with no added sugars and fresh fruit . Encouraged patient to continue talking with LCSW regarding psychosocial issues o Effect of pain on mental health o Recent death of brother and cousin within the past week . Encouraged patient to reach out to [facility] as needed . Encouraged patient to seek emergency medical care as needed  Patient Self Care Activities:  . Performs ADL's independently . Does not drive and depends on family for transportation  Please see past updates related to this goal by clicking on the "Past Updates" button in the selected goal          Plan:  The care management team will reach out to the patient again over the next 30 days.  The patient has been provided with contact information for the care management team and has been advised to call with any health related questions or concerns.    [name], BSN, RN-BC Embedded Chronic Care Manager [facility] / [place] Management Direct Dial: [phone]

## 2020-03-20 NOTE — Telephone Encounter (Signed)
03/20/2020  Patient has poorly controlled pain due to postherpetic neuralgia. She is taking Lyrica 100mg  at night and is followed by neurology at [facility]. The pain has never been well controlled and doesn't feel that she is making any progress with current treatment.  Discussed that compounded gabapentin cream may be beneficial. Forwarding to Lottie Dawson, PharmD for formula recommendation and then will discuss with PCP.   [name], BSN, RN-BC Embedded Chronic Care Manager [facility] / [place] Management Direct Dial: [phone]

## 2020-03-21 NOTE — Telephone Encounter (Signed)
  Chronic Care Management   Note  03/21/2020 Name: Shelly [name] MRN: [id number] DOB: Jul 30, 1940  Reached out to patient regarding the creams that Lottie Dawson, PharmD suggested might be beneficial, but I was unable to speak with her. I left a HIPAA compliant voicemail requesting that she return my call to discuss options and cost.    Follow up plan: The care management team will reach out to the patient again over the next 30 days.   [name], BSN, RN-BC Embedded Chronic Care Manager [facility] / [place] Management Direct Dial: [phone]

## 2020-03-22 DIAGNOSIS — D638 Anemia in other chronic diseases classified elsewhere: Secondary | ICD-10-CM | POA: Diagnosis not present

## 2020-03-22 DIAGNOSIS — N1832 Chronic kidney disease, stage 3b: Secondary | ICD-10-CM | POA: Diagnosis not present

## 2020-03-22 DIAGNOSIS — I129 Hypertensive chronic kidney disease with stage 1 through stage 4 chronic kidney disease, or unspecified chronic kidney disease: Secondary | ICD-10-CM | POA: Diagnosis not present

## 2020-03-22 DIAGNOSIS — I5032 Chronic diastolic (congestive) heart failure: Secondary | ICD-10-CM | POA: Diagnosis not present

## 2020-03-28 ENCOUNTER — Ambulatory Visit: Payer: Medicare Other | Admitting: *Deleted

## 2020-03-28 ENCOUNTER — Telehealth: Payer: Self-pay | Admitting: *Deleted

## 2020-03-28 DIAGNOSIS — B0229 Other postherpetic nervous system involvement: Secondary | ICD-10-CM

## 2020-03-28 DIAGNOSIS — I10 Essential (primary) hypertension: Secondary | ICD-10-CM

## 2020-03-28 DIAGNOSIS — N1832 Chronic kidney disease, stage 3b: Secondary | ICD-10-CM

## 2020-03-28 NOTE — Chronic Care Management (AMB) (Signed)
Chronic Care Management   Follow Up Note   03/28/2020 Name: Shelly [name] MRN: [id number] DOB: 1941/01/26  Referred by: [name], [name], MD Reason for referral : Chronic Care Management (RN follow up)   Shelly [name] is a 79 y.o. year old female who is a primary care patient of [name], [name], MD. The CCM team was consulted for assistance with chronic disease management and care coordination needs.    Review of patient status, including review of consultants reports, relevant laboratory and other test results, and collaboration with appropriate care team members and the patient's provider was performed as part of comprehensive patient evaluation and provision of chronic care management services.    SDOH (Social Determinants of Health) assessments performed: No See Care Plan activities for detailed interventions related to [facility])     Outpatient Encounter Medications as of 03/28/2020  Medication Sig  . Capsaicin 0.1 % CREA Apply 1 application topically 4 (four) times daily as needed.  . cetirizine (ZYRTEC) 10 MG tablet Take 10 mg by mouth daily.  . chlorthalidone (HYGROTON) 25 MG tablet TAKE ONE TABLET BY MOUTH DAILY  . clopidogrel (PLAVIX) 75 MG tablet TAKE ONE TABLET BY MOUTH DAILY  . clotrimazole-betamethasone (LOTRISONE) cream APPLY AS DIRECTED TWICE DAILY.  . furosemide (LASIX) 40 MG tablet Take 1 tablet (40 mg total) by mouth as needed for edema (weight gain).  . hydrocortisone (ANUSOL-HC) 2.5 % rectal cream Place 1 application rectally 2 (two) times daily. As needed for rectal discomfort.  Marland Kitchen lisinopril (ZESTRIL) 40 MG tablet TAKE ONE TABLET BY MOUTH DAILY.  . methimazole (TAPAZOLE) 5 MG tablet Take 0.5 tablets (2.5 mg total) by mouth daily.  . metoprolol succinate (TOPROL XL) 50 MG 24 hr tablet Take 1 tablet (50 mg total) by mouth daily. Take with or immediately following a meal.  . Multiple Vitamin (MULTIVITAMIN) tablet Take 1 tablet by mouth daily.  .  pantoprazole (PROTONIX) 40 MG tablet Take 1 tablet (40 mg total) by mouth daily. 30 minutes before breakfast  . pregabalin (LYRICA) 100 MG capsule Take 1 capsule (100 mg total) by mouth at bedtime as needed. Patient not sure of strength  . rosuvastatin (CRESTOR) 5 MG tablet TAKE ONE TABLET BY MOUTH DAILY.   No facility-administered encounter medications on file as of 03/28/2020.      Goals Addressed            This Visit's Progress   . "I want to talk to someone about chronic pain"       [place] (see longtitudinal plan of care for additional care plan information)  Feelings of hopelessness/sadness in patient with chronic pain related to post herpetic neuralgia and grief in a patient with CKD, HTN, and hx of GI bleed.  Current Barriers:  Marland Kitchen Knowledge Deficits related to psychosocial support options . Chronic pain . Transportation . Doesn't want to be a burden to her family  Nurse Case Manager Clinical Goal(s):  Marland Kitchen Over the next 90 days, patient will continue to talk with LCSW about psychosocial issues related to chronic pain . Over the next 90 days, patient will continue to talk with RN Care Manager about management of chronic pain . Over the next 14 days, patient will use prescribed medications as directed  Interventions:  . Chart reviewed including recent office notes and lab reports . Talked with patient by telephone about postherpetic neuralgia . Previously reviewed Medications and discussed Lyrica o Doesn't feel that it has helped pain significantly  no matter the dosage tried. Just knocks the edge off slightly. Unable to take during the day because per patient, it made her "pass out" . Collaborated with PharmD regarding compounded pain creams o She recommended a mixture of gabapentin, lidocaine, baclofen, menthol, and diclofenac that can be compounded by [employer] . Message sent to Dr [name] with the recommendation and advised that patient would like to  try the 60gram tube and can pick it up on Friday while she is in [place] . Encouraged patient to continue talking with LCSW regarding psychosocial issues o Effect of pain on mental health o Recent death of brother and cousin within the past week o Death of close friend last night . Encouraged patient to reach out to [facility] as needed . Encouraged patient to seek emergency medical care as needed  Patient Self Care Activities:  . Performs ADL's independently . Does not drive and depends on family for transportation  Please see past updates related to this goal by clicking on the "Past Updates" button in the selected goal          Plan:   The care management team will reach out to the patient again over the next 14 days.    [name], BSN, RN-BC Embedded Chronic Care Manager [facility] / [place] Management Direct Dial: [phone]

## 2020-03-28 NOTE — Telephone Encounter (Signed)
03/28/2020  Patient has chronic uncontrolled pain due to postherpetic neuralgia. She has a neurologist but hasn't found anything that greatly improves the pain. I talked with Lottie Dawson, PharmD about compounded pain cream and she recommended what is listed in the picture below. It's compounded by [facility] and the patient would like the 60 gram tube if you think the Rx is appropriate. She can pick it up on Friday while she's in [place] for an appointment.     Forwarding to Dr [name] for review and action.   [name], BSN, RN-BC Embedded Chronic Care Manager [facility] / [place] Management Direct Dial: [phone]

## 2020-03-28 NOTE — Patient Instructions (Signed)
Visit Information  Goals Addressed            This Visit's Progress   . "I want to talk to someone about chronic pain"       Shelly [name] (see longtitudinal plan of care for additional care plan information)  Feelings of hopelessness/sadness in patient with chronic pain related to post herpetic neuralgia and grief in a patient with CKD, HTN, and hx of GI bleed.  Current Barriers:  Marland Kitchen Knowledge Deficits related to psychosocial support options . Chronic pain . Transportation . Doesn't want to be a burden to her family  Nurse Case Manager Clinical Goal(s):  Marland Kitchen Over the next 90 days, patient will continue to talk with LCSW about psychosocial issues related to chronic pain . Over the next 90 days, patient will continue to talk with RN Care Manager about management of chronic pain . Over the next 14 days, patient will use prescribed medications as directed  Interventions:  . Chart reviewed including recent office notes and lab reports . Talked with patient by telephone about postherpetic neuralgia . Previously reviewed Medications and discussed Lyrica o Doesn't feel that it has helped pain significantly no matter the dosage tried. Just knocks the edge off slightly. Unable to take during the day because per patient, it made her "pass out" . Collaborated with PharmD regarding compounded pain creams o She recommended a mixture of gabapentin, lidocaine, baclofen, menthol, and diclofenac that can be compounded by [employer] . Message sent to Dr [name] with the recommendation and advised that patient would like to try the 60gram tube and can pick it up on Friday while she is in [place] . Encouraged patient to continue talking with LCSW regarding psychosocial issues o Effect of pain on mental health o Recent death of brother and cousin within the past week o Death of close friend last night . Encouraged patient to reach out to [facility] as needed . Encouraged patient to  seek emergency medical care as needed  Patient Self Care Activities:  . Performs ADL's independently . Does not drive and depends on family for transportation  Please see past updates related to this goal by clicking on the "Past Updates" button in the selected goal         Patient verbalizes understanding of instructions provided today.   Follow-up Plan The care management team will reach out to the patient again over the next 14 days.   [name], BSN, RN-BC Embedded Chronic Care Manager [facility] / [place] Management Direct Dial: [phone]

## 2020-03-28 NOTE — Telephone Encounter (Signed)
Left detailed message stating Rx has been sent to pharmacy and to call back with any questions or concerns

## 2020-03-28 NOTE — Telephone Encounter (Signed)
Placed order with Almyra Free

## 2020-03-30 DIAGNOSIS — I129 Hypertensive chronic kidney disease with stage 1 through stage 4 chronic kidney disease, or unspecified chronic kidney disease: Secondary | ICD-10-CM | POA: Diagnosis not present

## 2020-03-30 DIAGNOSIS — N1832 Chronic kidney disease, stage 3b: Secondary | ICD-10-CM | POA: Diagnosis not present

## 2020-03-30 DIAGNOSIS — E872 Acidosis: Secondary | ICD-10-CM | POA: Diagnosis not present

## 2020-03-30 DIAGNOSIS — D638 Anemia in other chronic diseases classified elsewhere: Secondary | ICD-10-CM | POA: Diagnosis not present

## 2020-03-30 DIAGNOSIS — I5032 Chronic diastolic (congestive) heart failure: Secondary | ICD-10-CM | POA: Diagnosis not present

## 2020-04-05 ENCOUNTER — Telehealth: Payer: Medicare Other

## 2020-04-06 ENCOUNTER — Ambulatory Visit: Payer: Medicare Other | Admitting: Licensed Clinical Social Worker

## 2020-04-06 DIAGNOSIS — K219 Gastro-esophageal reflux disease without esophagitis: Secondary | ICD-10-CM

## 2020-04-06 DIAGNOSIS — M1712 Unilateral primary osteoarthritis, left knee: Secondary | ICD-10-CM

## 2020-04-06 DIAGNOSIS — I10 Essential (primary) hypertension: Secondary | ICD-10-CM

## 2020-04-06 DIAGNOSIS — I2510 Atherosclerotic heart disease of native coronary artery without angina pectoris: Secondary | ICD-10-CM

## 2020-04-06 DIAGNOSIS — F419 Anxiety disorder, unspecified: Secondary | ICD-10-CM

## 2020-04-06 DIAGNOSIS — N1832 Chronic kidney disease, stage 3b: Secondary | ICD-10-CM

## 2020-04-06 NOTE — Patient Instructions (Addendum)
Licensed Clinical Social Worker Visit Information  Goals we discussed today:   .  Client will talk with LCSW in next 30 days about depression sympotms related to health conditions faced (pt-stated)       CARE PLAN ENTRY (see longtitudinal plan of care for additional care plan information)  Current Barriers:   Decreased appetite  Mobility issues in client with Chronic diagnoses of HTN, GERD,Anxiety,CAD, CKD,OA  Grief issues faced  Clinical Social Work Clinical Goal(s):   LCSW will talk with client in next 30 days to discuss depression symptoms of client related to her health conditions  Interventions:  Talked with client about CCM services  Talked with client about her upcoming medical appointments  Provided counseling support for client (she experienced the death of one daughter one year ago and experienced death of another daughter 4 years ago)  Encouraged client to talk with RNCM as needed to address nursing issues she faced  Talked with client about client decreased energy  Talked with client about grief support resources through [facility]  Client and LCSW talked about client's pain issues related to shingles issues she faces (she said she is going today to pick up prescription to help with Shingles issues)  Talked with client about sleeping issues of client  Talked with client about appetite of client  Talked with client about ambulation of client (she said she bought a knee brace and is using knee brace as needed)  Talked with client about vision of client  Talked with client about her mood. She said dealing with grief at [place] time is difficult  Talked with client about family support (two grandchildren stay with client currently)  Talked with client about food procurement   Patient Self Care Activities:   Takes medications as prescribed Attends scheduled medical appointments   Patient self care deficits:  Mobility  issues   Initial goal documentation     Follow Up Plan: LCSW to call client in next 4 weeks to talk with client about depression symptoms faced by client  Materials Provided: No  The patient verbalized understanding of instructions provided today and declined a print copy of patient instruction materials.   Norva Riffle.Ellwood Steidle MSW, LCSW Licensed Clinical Social Worker [facility] Care Management [phone]

## 2020-04-06 NOTE — Chronic Care Management (AMB) (Signed)
Chronic Care Management    Clinical Social Work Follow Up Note  04/06/2020 Name: Shelly [name] MRN: [id number] DOB: [birth year]  Shelly [name] is a 79 y.o. year old female who is a primary care patient of [name], [name], MD. The CCM team was consulted for assistance with [employer] .   Review of patient status, including review of consultants reports, other relevant assessments, and collaboration with appropriate care team members and the patient's provider was performed as part of comprehensive patient evaluation and provision of chronic care management services.    SDOH (Social Determinants of Health) assessments performed: No; risk for depression; risk for physical inactivity; risk for stress; risk for social isolation; risk for tobacco use    Office Visit from 10/31/2019 in [place]  PHQ-9 Total Score 10       GAD 7 : Generalized Anxiety Score 10/31/2019 09/28/2019 08/29/2019  Nervous, Anxious, on Edge 1 1 1   Control/stop worrying 2 2 1   Worry too much - different things 2 2 1   Trouble relaxing 0 0 0  Restless 0 0 0  Easily annoyed or irritable 0 0 0  Afraid - awful might happen 0 0 0  Total GAD 7 Score 5 5 3   Anxiety Difficulty - - Somewhat difficult    Outpatient Encounter Medications as of 04/06/2020  Medication Sig  . Capsaicin 0.1 % CREA Apply 1 application topically 4 (four) times daily as needed.  . cetirizine (ZYRTEC) 10 MG tablet Take 10 mg by mouth daily.  . chlorthalidone (HYGROTON) 25 MG tablet TAKE ONE TABLET BY MOUTH DAILY  . clopidogrel (PLAVIX) 75 MG tablet TAKE ONE TABLET BY MOUTH DAILY  . clotrimazole-betamethasone (LOTRISONE) cream APPLY AS DIRECTED TWICE DAILY.  . furosemide (LASIX) 40 MG tablet Take 1 tablet (40 mg total) by mouth as needed for edema (weight gain).  . hydrocortisone (ANUSOL-HC) 2.5 % rectal cream Place 1 application rectally 2 (two) times daily. As needed for rectal discomfort.  Marland Kitchen lisinopril  (ZESTRIL) 40 MG tablet TAKE ONE TABLET BY MOUTH DAILY.  . methimazole (TAPAZOLE) 5 MG tablet Take 0.5 tablets (2.5 mg total) by mouth daily.  . metoprolol succinate (TOPROL XL) 50 MG 24 hr tablet Take 1 tablet (50 mg total) by mouth daily. Take with or immediately following a meal.  . Multiple Vitamin (MULTIVITAMIN) tablet Take 1 tablet by mouth daily.  . pantoprazole (PROTONIX) 40 MG tablet Take 1 tablet (40 mg total) by mouth daily. 30 minutes before breakfast  . pregabalin (LYRICA) 100 MG capsule Take 1 capsule (100 mg total) by mouth at bedtime as needed. Patient not sure of strength  . rosuvastatin (CRESTOR) 5 MG tablet TAKE ONE TABLET BY MOUTH DAILY.   No facility-administered encounter medications on file as of 04/06/2020.    Goals    .  Client will talk with LCSW in next 30 days about depression sympotms related to health conditions faced (pt-stated)      CARE PLAN ENTRY (see longtitudinal plan of care for additional care plan information)  Current Barriers:  . Decreased appetite . Mobility issues in client with Chronic diagnoses of HTN, GERD,Anxiety,CAD, CKD,OA . Grief issues faced  Clinical Social Work Clinical Goal(s):  Marland Kitchen LCSW will talk with client in next 30 days to discuss depression symptoms of client related to her health conditions  Interventions: . Talked with client about CCM services . Talked with client about her upcoming medical appointments . Provided counseling support for client (she  experienced the death of one daughter one year ago and experienced death of another daughter 4 years ago) . Encouraged client to talk with RNCM as needed to address nursing issues she faced . Talked with client about client decreased energy . Talked with client about grief support resources through [facility] . Client and LCSW talked about client's pain issues related to shingles issues she faces (she said she is going today to pick up prescription to help with  Shingles issues) . Talked with client about sleeping issues of client . Talked with client about appetite of client . Talked with client about ambulation of client (she said she bought a knee brace and is using knee brace as needed) . Talked with client about vision of client . Talked with client about her mood. She said dealing with grief at [facility] time is difficult . Talked with client about family support (two grandchildren stay with client currently) . Talked with client about food procurement   Patient Self Care Activities:   Takes medications as prescribed Attends scheduled medical appointments  . Patient self care deficits: Marland Kitchen Mobility issues   Initial goal documentation     Follow Up Plan:  LCSW to call client in next 4 weeks to talk with client about depression symptoms faced by client  Norva Riffle.Peytan Andringa MSW, LCSW Licensed Clinical Social Worker [facility] Care Management [phone]

## 2020-04-09 ENCOUNTER — Telehealth: Payer: Medicare Other | Admitting: *Deleted

## 2020-04-23 NOTE — Progress Notes (Signed)
Cardiology Office Note  Date: 04/24/2020   ID: Shelly [name], DOB [birth year], MRN [id number]  PCP:  [name], Shelly Kaufmann, MD  Cardiologist:  No primary care provider on file. Electrophysiologist:  None   Chief Complaint: Bilateral lower extremity edema, CAD,  History of Present Illness: Shelly [name] is a 79 y.o. female with a history of CAD status post NSTEMI stenting to proximal to mid RCA 2015.  Moderate residual CAD, LE edema, HLD, CKD stage III, HTN, HLD.  Last saw [name] 07/22/2019.  Her CAD was stable.  No recurrent anginal symptoms since stenting.  Tolerating Plavix.  Continuing beta-blocker and rosuvastatin.   She did not tolerate high intensity statin medication.  She was tolerating Crestor 5 mg.  Goal of LDL less than 70.  She was having continuing bilateral lower extremity edema.  A follow-up echocardiogram was ordered to evaluate cardiac structure and function.  Lasix 40 mg as needed was ordered.  She was advised to weigh herself daily and if weight increased by 3 pounds in 24 hours she could take a dose of Lasix.  She was using compression stockings.  She was advised to elevate her legs at home.  Last saw me on 01/24/2020: She was having no anginal or exertional symptoms.  She was continuing Plavix.  Toprol was increased to 50 mg daily.  She was continuing lisinopril 40 mg daily, chlorthalidone 25 mg daily.  She was continuing Crestor 5 mg daily with last LDL 101 in November 2020.  She was continuing as needed Lasix 40 mg for lower extremity swelling. She had no edema on that day.  She was referred to nephrology due to worsening renal function.  History of syncopal episodes attributed to Lyrica.  Had a prior syncopal episode in August but had no subsequent syncope.  She was taking Lyrica 100 mg at night as needed  She is here today for 92-month follow-up.  She states she has been taking her fluid medicine like she should.  She states she has had a couple of incidents  where she involuntarily urinated and it was embarrassing.  She denies as any other issues in the interim since last visit.  States she recently saw her nephrologist.  She states nephrologist informed her kidney function had improved. Her renal function on 03/22/2020 at nephrology office showed creatinine of 1.76 with GFR of 39.  She denies any anginal or exertional symptoms, palpitations or arrhythmias, orthostatic symptoms, CVA or TIA-like symptoms, PND, orthopnea, lower extremity edema.  Blood pressure today is 164/70.  Past Medical History:  Diagnosis Date  . Allergy   . Anemia    GI bleed  . Anginal pain ([place])   . Anxiety   . Arthritis   . Blood transfusion without reported diagnosis   . Cataract   . Chronic kidney disease   . Coronary artery disease    SEVERE  . GERD (gastroesophageal reflux disease)   . GI bleed 12/2013  . Goiter   . Hyperlipidemia   . Hypertension   . Myocardial infarct ([place])   . Myocardial infarction ([place]) 08/2013  . Neuralgia of right lower extremity 07/13/2017  . Pancolonic diverticulosis 12/2013  . Post herpetic neuralgia     Past Surgical History:  Procedure Laterality Date  . CARPAL TUNNEL RELEASE Bilateral   . COLONOSCOPY N/A 01/13/2014   Dr. Hung:pandiverticulosis   . COLONOSCOPY N/A 12/14/2015   Dr. Michail Sermon: pancolonic diverticulosis, internal hemorrhoids   . CORNEAL TRANSPLANT    .  CORONARY ANGIOPLASTY  08/2013  . ESOPHAGOGASTRODUODENOSCOPY N/A 12/14/2015   Dr. Michail Sermon: normal   . GIVENS CAPSULE STUDY N/A 08/13/2016   Dr. Oneida Alar: enteritis due to aspirin.   Marland Kitchen HEEL SPUR EXCISION    . LEFT HEART CATHETERIZATION WITH CORONARY ANGIOGRAM N/A 08/23/2013   Procedure: LEFT HEART CATHETERIZATION WITH CORONARY ANGIOGRAM;  Surgeon: Peter M Martinique, MD;  Location: [facility] CATH LAB;  Service: Cardiovascular;  Laterality: N/A;  . PERCUTANEOUS CORONARY STENT INTERVENTION (PCI-S) N/A 08/24/2013   Procedure: PERCUTANEOUS CORONARY STENT INTERVENTION (PCI-S);  Surgeon:  Peter M Martinique, MD;  Location: [facility] CATH LAB;  Service: Cardiovascular;  Laterality: N/A;  . TONSILLECTOMY    . TUBAL LIGATION      Current Outpatient Medications  Medication Sig Dispense Refill  . Capsaicin 0.1 % CREA Apply 1 application topically 4 (four) times daily as needed. 60 g 5  . cetirizine (ZYRTEC) 10 MG tablet Take 10 mg by mouth daily.    . chlorthalidone (HYGROTON) 25 MG tablet TAKE ONE TABLET BY MOUTH DAILY 30 tablet 4  . clopidogrel (PLAVIX) 75 MG tablet TAKE ONE TABLET BY MOUTH DAILY 90 tablet 3  . clotrimazole-betamethasone (LOTRISONE) cream APPLY AS DIRECTED TWICE DAILY. 45 g 2  . furosemide (LASIX) 40 MG tablet Take 1 tablet (40 mg total) by mouth as needed for edema (weight gain). 30 tablet 6  . hydrocortisone (ANUSOL-HC) 2.5 % rectal cream Place 1 application rectally 2 (two) times daily. As needed for rectal discomfort. 30 g 1  . lisinopril (ZESTRIL) 40 MG tablet TAKE ONE TABLET BY MOUTH DAILY. 90 tablet 0  . methimazole (TAPAZOLE) 5 MG tablet Take 0.5 tablets (2.5 mg total) by mouth daily. 15 tablet 3  . metoprolol succinate (TOPROL XL) 50 MG 24 hr tablet Take 1 tablet (50 mg total) by mouth daily. Take with or immediately following a meal. 90 tablet 2  . Multiple Vitamin (MULTIVITAMIN) tablet Take 1 tablet by mouth daily.    . pantoprazole (PROTONIX) 40 MG tablet Take 1 tablet (40 mg total) by mouth daily. 30 minutes before breakfast 90 tablet 3  . pregabalin (LYRICA) 100 MG capsule Take 1 capsule (100 mg total) by mouth at bedtime as needed. Patient not sure of strength 30 capsule 5  . rosuvastatin (CRESTOR) 5 MG tablet TAKE ONE TABLET BY MOUTH DAILY. 90 tablet 3  . amLODipine (NORVASC) 5 MG tablet Take 1 tablet (5 mg total) by mouth daily. 90 tablet 3   No current facility-administered medications for this visit.   Allergies:  Nsaids and Atorvastatin   Social History: The patient  reports that she has never smoked. She has never used smokeless tobacco. She reports  that she does not drink alcohol and does not use drugs.   Family History: The patient's family history includes Anemia in her daughter; Arthritis in her daughter and daughter; Asthma in her sister; COPD in her sister; Cancer in her daughter, daughter, and mother; Diverticulitis in her daughter; Early death in her brother and brother; GER disease in her son; GI problems in her son; Hernia in her son; Hypertension in her brother; Liver cancer in her daughter; Other in her father; Pancreatic disease in her daughter; Stroke in her sister and sister; Transient ischemic attack in her brother.   ROS:  Please see the history of present illness. Otherwise, complete review of systems is positive for none.  All other systems are reviewed and negative.   Physical Exam: VS:  BP (!) 164/70   Pulse  72   Ht 5\' 4"  (1.626 m)   Wt 255 lb (115.7 kg)   SpO2 95%   BMI 43.77 kg/m , BMI Body mass index is 43.77 kg/m.  Wt Readings from Last 3 Encounters:  04/24/20 255 lb (115.7 kg)  03/06/20 251 lb 6.4 oz (114 kg)  02/13/20 248 lb 8 oz (112.7 kg)    General: Morbidly obese patient appears comfortable at rest. Neck: Supple, no elevated JVP or carotid bruits, no thyromegaly. Lungs: Clear to auscultation, nonlabored breathing at rest. Cardiac: Regular rate and rhythm, no S3 or significant systolic murmur, no pericardial rub. Extremities: No pitting edema, distal pulses 2+. Skin: Warm and dry. Musculoskeletal: No kyphosis. Neuropsychiatric: Alert and oriented x3, affect grossly appropriate.  ECG:    Recent Labwork: 01/31/2020: ALT 7; AST 11; BUN 21; Creatinine, Ser 1.48; Hemoglobin 10.3; Platelets 279; Potassium 4.9; Sodium 141; TSH 0.384     Component Value Date/Time   CHOL 163 01/31/2020 1454   TRIG 143 01/31/2020 1454   HDL 42 01/31/2020 1454   CHOLHDL 3.9 01/31/2020 1454   CHOLHDL 2.6 11/20/2016 0854   VLDL 11 11/20/2016 0854   LDLCALC 96 01/31/2020 1454    Other Studies Reviewed  Today:  Echocardiogram 08/31/2019  1. Left ventricular ejection fraction, by estimation, is 65 to 70%. The left ventricle has hyperdynamic function. The left ventricle has no regional wall motion abnormalities. Left ventricular diastolic parameters are consistent with Grade I diastolic dysfunction (impaired relaxation). 2. Right ventricular systolic function is normal. The right ventricular size is normal. There is moderately elevated pulmonary artery systolic pressure. 3. The mitral valve is normal in structure. Mild mitral valve regurgitation. No evidence of mitral stenosis. 4. The aortic valve is tricuspid. Aortic valve regurgitation is not visualized. No aortic stenosis is present. 5. The inferior vena cava is normal in size with greater than 50% respiratory variability, suggesting right atrial pressure of 3 mmHg. Comparison(s): Echocardiogram done 12/10/15 showed an EF of 60%.  Assessment and Plan:   1. CAD in native artery No anginal or exertional symptoms.  Continue Plavix 75 mg daily.  Cannot take aspirin due to bleeding.  2. Essential hypertension Blood pressure elevated on arrival today at 164/70.  Continue Toprol to 50 mg daily.  Continue lisinopril 40 mg daily.  Continue chlorthalidone 25 mg daily.  Add amlodipine 5 mg p.o. daily to current regimen.  Advised patient goal of blood 130/80.  She has an upcoming appointment with her PCP on December 13.  Advised her to inform PCP of addition of new blood pressure medication.  Advised her to call if sustained blood pressures above 130/80.  3. Hyperlipidemia LDL goal <70 Last lipid panel 04/06/2019: TC 168, TG 72, HDL 53, LDL 101.  Continue Crestor 5 mg daily.  4. Bilateral leg edema No edema noted today on exam.  Continue Lasix 40 mg as needed for lower extremity swelling.  5. CKD III  PCP recently ordered lab work 2 months ago which demonstrated worsening renal function was creatinine of 1.62 and GFR of 35.  She states she  recently saw Dr. Theador Hawthorne nephrology.  Recent creatinine on 03/22/2020 was 1.76 and GFR of 31.  6.  Syncopal episodes Patient states she had some recent syncopal episodes which were attributed to Lyrica.  She states she had passed out on higher doses of Lyrica.  She states her most recent syncopal episode was on August 5.  She denied any palpitations.  She stated she did feel some mild  dizziness prior.  No further syncopal episodes.  She denied any palpitations or arrhythmias prior to the episode.  She has decreased her Lyrica to 100 mg at bedtime as needed.   Medication Adjustments/Labs and Tests Ordered: Current medicines are reviewed at length with the patient today.  Concerns regarding medicines are outlined above.   Disposition: Follow-up with Dr. Harl Bowie or APP 6 months Signed, Levell July, NP 04/24/2020 2:08 PM    [facility] [place], [place], [place] [address] Phone: [phone]; Fax: [phone]

## 2020-04-24 ENCOUNTER — Encounter: Payer: Self-pay | Admitting: Family Medicine

## 2020-04-24 ENCOUNTER — Ambulatory Visit: Payer: Medicare Other | Admitting: Family Medicine

## 2020-04-24 VITALS — BP 164/70 | HR 72 | Ht 64.0 in | Wt 255.0 lb

## 2020-04-24 DIAGNOSIS — Z87898 Personal history of other specified conditions: Secondary | ICD-10-CM

## 2020-04-24 DIAGNOSIS — R600 Localized edema: Secondary | ICD-10-CM

## 2020-04-24 DIAGNOSIS — I2510 Atherosclerotic heart disease of native coronary artery without angina pectoris: Secondary | ICD-10-CM | POA: Diagnosis not present

## 2020-04-24 DIAGNOSIS — E785 Hyperlipidemia, unspecified: Secondary | ICD-10-CM | POA: Diagnosis not present

## 2020-04-24 DIAGNOSIS — I10 Essential (primary) hypertension: Secondary | ICD-10-CM

## 2020-04-24 DIAGNOSIS — I129 Hypertensive chronic kidney disease with stage 1 through stage 4 chronic kidney disease, or unspecified chronic kidney disease: Secondary | ICD-10-CM

## 2020-04-24 DIAGNOSIS — N183 Chronic kidney disease, stage 3 unspecified: Secondary | ICD-10-CM

## 2020-04-24 MED ORDER — AMLODIPINE BESYLATE 5 MG PO TABS: 5.0000 mg | ORAL_TABLET | Freq: Every day | ORAL | 3 refills | Status: DC

## 2020-04-24 NOTE — Patient Instructions (Addendum)
Medication Instructions:   Your physician has recommended you make the following change in your medication:   Start amlodipine 5 mg by mouth daily  Continue other medications the same  Labwork:  None  Testing/Procedures:  None  Follow-Up:  Your physician recommends that you schedule a follow-up appointment in: 6 months.  Any Other Special Instructions Will Be Listed Below (If Applicable). Your physician has requested that you regularly monitor and record your blood pressure readings at home. Please use the same machine at the same time of day to check your readings and record them.   If you need a refill on your cardiac medications before your next appointment, please call your pharmacy.

## 2020-04-30 ENCOUNTER — Other Ambulatory Visit: Payer: Self-pay

## 2020-04-30 ENCOUNTER — Ambulatory Visit (INDEPENDENT_AMBULATORY_CARE_PROVIDER_SITE_OTHER): Payer: Medicare Other | Admitting: Family Medicine

## 2020-04-30 ENCOUNTER — Encounter: Payer: Self-pay | Admitting: Family Medicine

## 2020-04-30 VITALS — BP 118/54 | HR 77 | Temp 98.0°F | Ht 64.0 in | Wt 238.0 lb

## 2020-04-30 DIAGNOSIS — K219 Gastro-esophageal reflux disease without esophagitis: Secondary | ICD-10-CM

## 2020-04-30 DIAGNOSIS — N1832 Chronic kidney disease, stage 3b: Secondary | ICD-10-CM | POA: Diagnosis not present

## 2020-04-30 DIAGNOSIS — E0590 Thyrotoxicosis, unspecified without thyrotoxic crisis or storm: Secondary | ICD-10-CM

## 2020-04-30 DIAGNOSIS — I10 Essential (primary) hypertension: Secondary | ICD-10-CM | POA: Diagnosis not present

## 2020-04-30 MED ORDER — CHLORTHALIDONE 25 MG PO TABS: 25.0000 mg | ORAL_TABLET | Freq: Every day | ORAL | 3 refills | Status: DC

## 2020-04-30 MED ORDER — LISINOPRIL 40 MG PO TABS: 40.0000 mg | ORAL_TABLET | Freq: Every day | ORAL | 3 refills | Status: DC

## 2020-04-30 MED ORDER — AMLODIPINE BESYLATE 5 MG PO TABS: 2.5000 mg | ORAL_TABLET | Freq: Every day | ORAL | 3 refills | Status: DC

## 2020-04-30 NOTE — Progress Notes (Signed)
BP (!) 118/54   Pulse 77   Temp 98 F (36.7 C)   Ht 5' 4" (1.626 m)   Wt 238 lb (108 kg)   SpO2 100%   BMI 40.85 kg/m    Subjective:   Patient ID: Shelly [name], female    DOB: 1940/11/25, 79 y.o.   MRN: [id number]  HPI: Shelly [name] is a 79 y.o. female presenting on 04/30/2020 for Medical Management of Chronic Issues, Hypertension, Gastroesophageal Reflux, and Hyperthyroidism   HPI Hypertension Patient is currently on metoprolol and amlodipine and chlorthalidone and lisinopril, and their blood pressure today is 118/54. Patient denies any lightheadedness or dizziness. Patient denies headaches, blurred vision, chest pains, shortness of breath, or weakness. Denies any side effects from medication and is content with current medication.   Hyperlipidemia Patient is coming in for recheck of his hyperlipidemia. The patient is currently taking crestor. They deny any issues with myalgias or history of liver damage from it. They deny any focal numbness or weakness or chest pain.   Hypothyroidism recheck Patient is coming in for thyroid recheck today as well. They deny any issues with hair changes or heat or cold problems or diarrhea or constipation. They deny any chest pain or palpitations. They are currently on no medication currently    GERD Patient is currently on pantaproazole.  She denies any major symptoms or abdominal pain or belching or burping. She denies any blood in her stool or lightheadedness or dizziness.   Relevant past medical, surgical, family and social history reviewed and updated as indicated. Interim medical history since our last visit reviewed. Allergies and medications reviewed and updated.  Review of Systems  Constitutional: Negative for chills and fever.  Eyes: Negative for visual disturbance.  Respiratory: Negative for chest tightness and shortness of breath.   Cardiovascular: Negative for chest pain and leg swelling.  Musculoskeletal: Negative for  back pain and gait problem.  Skin: Negative for rash.  Neurological: Negative for light-headedness and headaches.  Psychiatric/Behavioral: Negative for agitation and behavioral problems.  All other systems reviewed and are negative.   Per HPI unless specifically indicated above   Allergies as of 04/30/2020      Reactions   Nsaids Other (See Comments)   AVOID all NSAID due to history of GI bleeding   Atorvastatin Other (See Comments)   Joint & muscle pain      Medication List       Accurate as of April 30, 2020  2:53 PM. If you have any questions, ask your nurse or doctor.        STOP taking these medications   Capsaicin 0.1 % Crea Stopped by: [name] , MD   methimazole 5 MG tablet Commonly known as: TAPAZOLE Stopped by: [name] , MD     TAKE these medications   amLODipine 5 MG tablet Commonly known as: NORVASC Take 0.5 tablets (2.5 mg total) by mouth daily. What changed: how much to take Changed by: [name], MD   cetirizine 10 MG tablet Commonly known as: ZYRTEC Take 10 mg by mouth daily.   chlorthalidone 25 MG tablet Commonly known as: HYGROTON Take 1 tablet (25 mg total) by mouth daily.   clopidogrel 75 MG tablet Commonly known as: PLAVIX TAKE ONE TABLET BY MOUTH DAILY   clotrimazole-betamethasone cream Commonly known as: LOTRISONE APPLY AS DIRECTED TWICE DAILY.   furosemide 40 MG tablet Commonly known as: Lasix Take 1 tablet (40 mg total) by mouth as  needed for edema (weight gain).   hydrocortisone 2.5 % rectal cream Commonly known as: ANUSOL-HC Place 1 application rectally 2 (two) times daily. As needed for rectal discomfort.   lisinopril 40 MG tablet Commonly known as: ZESTRIL Take 1 tablet (40 mg total) by mouth daily.   metoprolol succinate 50 MG 24 hr tablet Commonly known as: Toprol XL Take 1 tablet (50 mg total) by mouth daily. Take with or immediately following a meal.   multivitamin tablet Take 1  tablet by mouth daily.   pantoprazole 40 MG tablet Commonly known as: PROTONIX Take 1 tablet (40 mg total) by mouth daily. 30 minutes before breakfast   pregabalin 100 MG capsule Commonly known as: Lyrica Take 1 capsule (100 mg total) by mouth at bedtime as needed. Patient not sure of strength   rosuvastatin 5 MG tablet Commonly known as: CRESTOR TAKE ONE TABLET BY MOUTH DAILY.        Objective:   BP (!) 118/54   Pulse 77   Temp 98 F (36.7 C)   Ht 5' 4" (1.626 m)   Wt 238 lb (108 kg)   SpO2 100%   BMI 40.85 kg/m   Wt Readings from Last 3 Encounters:  04/30/20 238 lb (108 kg)  04/24/20 255 lb (115.7 kg)  03/06/20 251 lb 6.4 oz (114 kg)    Physical Exam Vitals and nursing note reviewed.  Constitutional:      General: She is not in acute distress.    Appearance: She is well-developed and well-nourished. She is not diaphoretic.  Eyes:     Extraocular Movements: EOM normal.     Conjunctiva/sclera: Conjunctivae normal.  Cardiovascular:     Rate and Rhythm: Normal rate and regular rhythm.     Pulses: Intact distal pulses.     Heart sounds: Normal heart sounds. No murmur heard.   Pulmonary:     Effort: Pulmonary effort is normal. No respiratory distress.     Breath sounds: Normal breath sounds. No wheezing.  Musculoskeletal:        General: No tenderness or edema. Normal range of motion.  Skin:    General: Skin is warm and dry.     Findings: No rash.  Neurological:     Mental Status: She is alert and oriented to person, place, and time.     Coordination: Coordination normal.  Psychiatric:        Mood and Affect: Mood and affect normal.        Behavior: Behavior normal.       Assessment & Plan:   Problem List Items Addressed This Visit      Cardiovascular and Mediastinum   Essential hypertension   Relevant Medications   lisinopril (ZESTRIL) 40 MG tablet   chlorthalidone (HYGROTON) 25 MG tablet   amLODipine (NORVASC) 5 MG tablet   Other Relevant  Orders   CMP14+EGFR     Digestive   GERD (gastroesophageal reflux disease)   Relevant Orders   CBC with Differential/Platelet     Endocrine   Subclinical hyperthyroidism - Primary   Relevant Orders   TSH     Genitourinary   CKD (chronic kidney disease), stage III (HCC)   Relevant Medications   lisinopril (ZESTRIL) 40 MG tablet   chlorthalidone (HYGROTON) 25 MG tablet   Other Relevant Orders   CMP14+EGFR    Blood pressure is on the lower side, recommended that she cut the amlodipine in half and take just 2.5 mg of the amlodipine.  I  do not want her getting lightheaded or dizzy or passing out or breaking something for  Follow up plan: Return in about 3 months (around 07/29/2020), or if symptoms worsen or fail to improve, for Thyroid and hypertension recheck.  Counseling provided for all of the vaccine components Orders Placed This Encounter  Procedures  . CBC with Differential/Platelet  . CMP14+EGFR  . TSH    Caryl Pina, MD [facility] 04/30/2020, 2:53 PM

## 2020-05-01 LAB — CMP14+EGFR
ALT: 7 IU/L (ref 0–32)
AST: 11 IU/L (ref 0–40)
Albumin/Globulin Ratio: 1.2 (ref 1.2–2.2)
Albumin: 3.6 g/dL — ABNORMAL LOW (ref 3.7–4.7)
Alkaline Phosphatase: 89 IU/L (ref 44–121)
BUN/Creatinine Ratio: 19 (ref 12–28)
BUN: 31 mg/dL — ABNORMAL HIGH (ref 8–27)
Bilirubin Total: <0.2 mg/dL (ref 0.0–1.2)
CO2: 21 mmol/L (ref 20–29)
Calcium: 9.5 mg/dL (ref 8.7–10.3)
Chloride: 109 mmol/L — ABNORMAL HIGH (ref 96–106)
Creatinine, Ser: 1.67 mg/dL — ABNORMAL HIGH (ref 0.57–1.00)
GFR calc Af Amer: 34 mL/min/{1.73_m2} — ABNORMAL LOW (ref >59)
GFR calc non Af Amer: 29 mL/min/{1.73_m2} — ABNORMAL LOW (ref >59)
Globulin, Total: 3.1 g/dL (ref 1.5–4.5)
Glucose: 105 mg/dL — ABNORMAL HIGH (ref 65–99)
Potassium: 4.8 mmol/L (ref 3.5–5.2)
Sodium: 141 mmol/L (ref 134–144)
Total Protein: 6.7 g/dL (ref 6.0–8.5)

## 2020-05-01 LAB — CBC WITH DIFFERENTIAL/PLATELET
Basophils Absolute: 0 10*3/uL (ref 0.0–0.2)
Basos: 1 %
EOS (ABSOLUTE): 0.1 10*3/uL (ref 0.0–0.4)
Eos: 1 %
Hematocrit: 31.3 % — ABNORMAL LOW (ref 34.0–46.6)
Hemoglobin: 10.2 g/dL — ABNORMAL LOW (ref 11.1–15.9)
Immature Grans (Abs): 0 10*3/uL (ref 0.0–0.1)
Immature Granulocytes: 0 %
Lymphocytes Absolute: 2.2 10*3/uL (ref 0.7–3.1)
Lymphs: 31 %
MCH: 30.7 pg (ref 26.6–33.0)
MCHC: 32.6 g/dL (ref 31.5–35.7)
MCV: 94 fL (ref 79–97)
Monocytes Absolute: 0.8 10*3/uL (ref 0.1–0.9)
Monocytes: 11 %
Neutrophils Absolute: 4 10*3/uL (ref 1.4–7.0)
Neutrophils: 56 %
Platelets: 270 10*3/uL (ref 150–450)
RBC: 3.32 x10E6/uL — ABNORMAL LOW (ref 3.77–5.28)
RDW: 13.1 % (ref 11.7–15.4)
WBC: 7.1 10*3/uL (ref 3.4–10.8)

## 2020-05-01 LAB — TSH: TSH: 0.218 u[IU]/mL — ABNORMAL LOW (ref 0.450–4.500)

## 2020-05-03 ENCOUNTER — Other Ambulatory Visit: Payer: Self-pay | Admitting: "Endocrinology

## 2020-05-03 DIAGNOSIS — E0590 Thyrotoxicosis, unspecified without thyrotoxic crisis or storm: Secondary | ICD-10-CM | POA: Diagnosis not present

## 2020-05-04 LAB — T4, FREE: Free T4: 0.97 ng/dL (ref 0.82–1.77)

## 2020-05-04 LAB — T3, FREE: T3, Free: 2.5 pg/mL (ref 2.0–4.4)

## 2020-05-04 LAB — TSH: TSH: 0.227 u[IU]/mL — ABNORMAL LOW (ref 0.450–4.500)

## 2020-05-08 ENCOUNTER — Ambulatory Visit: Payer: Medicare Other | Admitting: Nurse Practitioner

## 2020-05-10 ENCOUNTER — Encounter: Payer: Self-pay | Admitting: *Deleted

## 2020-05-10 ENCOUNTER — Other Ambulatory Visit: Payer: Self-pay | Admitting: *Deleted

## 2020-05-10 DIAGNOSIS — E0590 Thyrotoxicosis, unspecified without thyrotoxic crisis or storm: Secondary | ICD-10-CM

## 2020-05-15 ENCOUNTER — Ambulatory Visit: Payer: Medicare Other | Admitting: Licensed Clinical Social Worker

## 2020-05-15 DIAGNOSIS — I10 Essential (primary) hypertension: Secondary | ICD-10-CM

## 2020-05-15 DIAGNOSIS — F419 Anxiety disorder, unspecified: Secondary | ICD-10-CM

## 2020-05-15 DIAGNOSIS — I2510 Atherosclerotic heart disease of native coronary artery without angina pectoris: Secondary | ICD-10-CM

## 2020-05-15 DIAGNOSIS — M1712 Unilateral primary osteoarthritis, left knee: Secondary | ICD-10-CM

## 2020-05-15 DIAGNOSIS — N1832 Chronic kidney disease, stage 3b: Secondary | ICD-10-CM

## 2020-05-15 DIAGNOSIS — K219 Gastro-esophageal reflux disease without esophagitis: Secondary | ICD-10-CM

## 2020-05-15 NOTE — Patient Instructions (Addendum)
Licensed Clinical Social Worker Visit Information  Goals we discussed today:  .  Client will talk with LCSW in next 30 days about depression sympotms related to health conditions faced (pt-stated)        CARE PLAN ENTRY (see longtitudinal plan of care for additional care plan information)  Current Barriers:   Decreased appetite  Mobility issues in client with Chronic diagnoses of HTN, GERD,Anxiety,CAD, CKD,OA  Grief issues faced  Clinical Social Work Clinical Goal(s):   LCSW will talk with client in next 30 days to discuss depression symptoms of client related to her health conditions  Interventions:  Talked with client about CCM services  Talked with client about her upcoming medical appointments  Provided counseling support for client (she experienced the death of one daughter one year ago and experienced death of another daughter 4 years ago)  Encouraged client to talk with RNCM as needed to address nursing issues she faced  Talked with client about client decreased energy  Talked with client about grief support resources through [facility]  Talked with client about transport needs of client  Talked with client about medication procurement for client  Talked with client about pain issues in her left knee  Talked with client about difficulty in standing  Talked with client about client completion of ADLs  Talked with client about appetite of client  Talked with client about her management of Shingles (she said she uses a cream each day to help with managing Shingles)  Talked with client about family support  Talked with client about [facility] support  Patient Self Care Activities:   Takes medications as prescribed Attends scheduled medical appointments   Patient self care deficits:  Mobility issues  Initial goal documentation    Follow Up Plan:LCSW to call client in next 4 weeks to talk with client about depression symptoms  faced by client  Materials Provided: No  he patient verbalized understanding of instructions provided today and declined a print copy of patient instruction materials.   Norva Riffle.Shelly [name] MSW, LCSW Licensed Clinical Social Worker [facility] [phone]

## 2020-05-15 NOTE — Chronic Care Management (AMB) (Signed)
Chronic Care Management    Clinical Social Work Follow Up Note  05/15/2020 Name: Shelly [name] MRN: [id number] DOB: [birth year]  Shelly [name] is a 79 y.o. year old female who is a primary care patient of [name], [name], MD. The CCM team was consulted for assistance with [employer] .   Review of patient status, including review of consultants reports, other relevant assessments, and collaboration with appropriate care team members and the patient's provider was performed as part of comprehensive patient evaluation and provision of chronic care management services.    SDOH (Social Determinants of Health) assessments performed: No; risk for depression; risk for stress; risk for physical inactivity ; risk for tobacco use; risk for financial strain   [employer] Visit from 10/31/2019 in [place]  PHQ-9 Total Score 10      GAD 7 : Generalized Anxiety Score 10/31/2019 09/28/2019 08/29/2019  Nervous, Anxious, on Edge 1 1 1   Control/stop worrying 2 2 1   Worry too much - different things 2 2 1   Trouble relaxing 0 0 0  Restless 0 0 0  Easily annoyed or irritable 0 0 0  Afraid - awful might happen 0 0 0  Total GAD 7 Score 5 5 3   Anxiety Difficulty - - Somewhat difficult    Outpatient Encounter Medications as of 05/15/2020  Medication Sig  . amLODipine (NORVASC) 5 MG tablet Take 0.5 tablets (2.5 mg total) by mouth daily.  . cetirizine (ZYRTEC) 10 MG tablet Take 10 mg by mouth daily.  . chlorthalidone (HYGROTON) 25 MG tablet Take 1 tablet (25 mg total) by mouth daily.  . clopidogrel (PLAVIX) 75 MG tablet TAKE ONE TABLET BY MOUTH DAILY  . clotrimazole-betamethasone (LOTRISONE) cream APPLY AS DIRECTED TWICE DAILY.  . furosemide (LASIX) 40 MG tablet Take 1 tablet (40 mg total) by mouth as needed for edema (weight gain).  . hydrocortisone (ANUSOL-HC) 2.5 % rectal cream Place 1 application rectally 2 (two) times daily. As needed for rectal  discomfort.  Marland Kitchen lisinopril (ZESTRIL) 40 MG tablet Take 1 tablet (40 mg total) by mouth daily.  . metoprolol succinate (TOPROL XL) 50 MG 24 hr tablet Take 1 tablet (50 mg total) by mouth daily. Take with or immediately following a meal.  . Multiple Vitamin (MULTIVITAMIN) tablet Take 1 tablet by mouth daily.  . pantoprazole (PROTONIX) 40 MG tablet Take 1 tablet (40 mg total) by mouth daily. 30 minutes before breakfast  . pregabalin (LYRICA) 100 MG capsule Take 1 capsule (100 mg total) by mouth at bedtime as needed. Patient not sure of strength  . rosuvastatin (CRESTOR) 5 MG tablet TAKE ONE TABLET BY MOUTH DAILY.   No facility-administered encounter medications on file as of 05/15/2020.    Goals    .  Client will talk with LCSW in next 30 days about depression sympotms related to health conditions faced (pt-stated)      CARE PLAN ENTRY (see longtitudinal plan of care for additional care plan information)  Current Barriers:  . Decreased appetite . Mobility issues in client with Chronic diagnoses of HTN, GERD,Anxiety,CAD, CKD,OA . Grief issues faced  Clinical Social Work Clinical Goal(s):  Marland Kitchen LCSW will talk with client in next 30 days to discuss depression symptoms of client related to her health conditions  Interventions: . Talked with client about CCM services . Talked with client about her upcoming medical appointments . Provided counseling support for client (she experienced the death of one daughter one year ago  and experienced death of another daughter 4 years ago) . Encouraged client to talk with RNCM as needed to address nursing issues she faced . Talked with client about client decreased energy . Talked with client about grief support resources through [facility] . Talked with client about transport needs of client . Talked with client about medication procurement for client . Talked with client about pain issues in her left knee . Talked with client about  difficulty in standing . Talked with client about client completion of ADLs . Talked with client about appetite of client . Talked with client about her management of Shingles (she said she uses a cream each day to help with managing Shingles) . Talked with client about family support  Talked with client about [facility] support  Patient Self Care Activities:   Takes medications as prescribed Attends scheduled medical appointments  . Patient self care deficits: Marland Kitchen Mobility issues  Initial goal documentation    Follow Up Plan: LCSW to call client in next 4 weeks to talk with client about depression symptoms faced by client  Norva Riffle.[employer] Wartman MSW, LCSW Licensed Clinical Social Worker [facility] Care Management [phone]

## 2020-05-16 ENCOUNTER — Other Ambulatory Visit: Payer: Self-pay

## 2020-05-16 ENCOUNTER — Encounter: Payer: Self-pay | Admitting: Nurse Practitioner

## 2020-05-16 ENCOUNTER — Ambulatory Visit (INDEPENDENT_AMBULATORY_CARE_PROVIDER_SITE_OTHER): Payer: Medicare Other | Admitting: Nurse Practitioner

## 2020-05-16 VITALS — BP 160/74 | HR 79 | Ht 64.0 in | Wt 254.4 lb

## 2020-05-16 DIAGNOSIS — E0590 Thyrotoxicosis, unspecified without thyrotoxic crisis or storm: Secondary | ICD-10-CM

## 2020-05-16 NOTE — Progress Notes (Signed)
05/16/2020, 3:22 PM                                     Endocrinology follow-up note    Subjective:    Patient ID: Shelly [name], female    DOB: 27-Feb-1941, PCP [name], [name], MD   Past Medical History:  Diagnosis Date  . Allergy   . Anemia    GI bleed  . Anginal pain ([place])   . Anxiety   . Arthritis   . Blood transfusion without reported diagnosis   . Cataract   . Chronic kidney disease   . Coronary artery disease    SEVERE  . GERD (gastroesophageal reflux disease)   . GI bleed 12/2013  . Goiter   . Hyperlipidemia   . Hypertension   . Myocardial infarct ([place])   . Myocardial infarction ([place]) 08/2013  . Neuralgia of right lower extremity 07/13/2017  . Pancolonic diverticulosis 12/2013  . Post herpetic neuralgia    Past Surgical History:  Procedure Laterality Date  . CARPAL TUNNEL RELEASE Bilateral   . COLONOSCOPY N/A 01/13/2014   Dr. Hung:pandiverticulosis   . COLONOSCOPY N/A 12/14/2015   Dr. Michail Sermon: pancolonic diverticulosis, internal hemorrhoids   . CORNEAL TRANSPLANT    . CORONARY ANGIOPLASTY  08/2013  . ESOPHAGOGASTRODUODENOSCOPY N/A 12/14/2015   Dr. Michail Sermon: normal   . GIVENS CAPSULE STUDY N/A 08/13/2016   Dr. Oneida Alar: enteritis due to aspirin.   Marland Kitchen HEEL SPUR EXCISION    . LEFT HEART CATHETERIZATION WITH CORONARY ANGIOGRAM N/A 08/23/2013   Procedure: LEFT HEART CATHETERIZATION WITH CORONARY ANGIOGRAM;  Surgeon: Peter M Martinique, MD;  Location: [facility] CATH LAB;  Service: Cardiovascular;  Laterality: N/A;  . PERCUTANEOUS CORONARY STENT INTERVENTION (PCI-S) N/A 08/24/2013   Procedure: PERCUTANEOUS CORONARY STENT INTERVENTION (PCI-S);  Surgeon: Peter M Martinique, MD;  Location: [facility] CATH LAB;  Service: Cardiovascular;  Laterality: N/A;  . TONSILLECTOMY    . TUBAL LIGATION     Social History   Socioeconomic History  . Marital status: Divorced    Spouse name: Not on file  . Number of children: 8  . Years of  education: 28  . Highest education level: 11th grade  Occupational History  . 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 Smoking status: Never Smoker  . Smokeless tobacco: Never Used  Vaping Use  . Vaping Use: Never used  Substance and Sexual Activity  . Alcohol use: No    Alcohol/week: 0.0 standard drinks  . Drug use: No  . Sexual activity: Not Currently    Birth control/protection: Surgical    Comment: divorced, lives with daughter and granddaughters  Other Topics Concern  . 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   No daily caffeine use.   Social Determinants of Health   Financial Resource Strain: Low Risk   . Difficulty of Paying Living Expenses: Not very hard  Food Insecurity: No Food Insecurity  . Worried About  Running Out of Food in the Last Year: Never true  . Ran Out of Food in the Last Year: Never true  Transportation Needs: No Transportation Needs  . Lack of Transportation (Medical): No  . Lack of Transportation (Non-Medical): No  Physical Activity: Inactive  . Days of Exercise per Week: 0 days  . Minutes of Exercise per Session: 0 min  Stress: No Stress Concern Present  . Feeling of Stress : Not at all  Social Connections: Moderately Integrated  . Frequency of Communication with Friends and Family: More than three times a week  . Frequency of Social Gatherings with Friends and Family: More than three times a week  . Attends Religious Services: More than 4 times per year  . Active Member of Clubs or Organizations: Yes  . Attends Archivist Meetings: More than 4 times per year  . Marital Status: Divorced   Family History  Problem Relation Age of Onset  . Hypertension Brother   . Transient ischemic attack Brother   . Cancer Mother        unsure of type   . Other Father        grangrene    . Asthma Sister   . Stroke Sister   . COPD Sister   . Stroke Sister   . Early death Brother   . Early death Brother   . Liver cancer Daughter   . Cancer Daughter   . Hernia Son        Umbilical  . GI problems Son   . Pancreatic disease Daughter        pancreatectomy  . Cancer Daughter   . Diverticulitis Daughter   . Arthritis Daughter        knee   . Arthritis Daughter        shoulder   . Anemia Daughter   . GER disease Son    Outpatient Encounter Medications as of 05/16/2020  Medication Sig  . amLODipine (NORVASC) 5 MG tablet Take 0.5 tablets (2.5 mg total) by mouth daily.  . cetirizine (ZYRTEC) 10 MG tablet Take 10 mg by mouth daily.  . chlorthalidone (HYGROTON) 25 MG tablet Take 1 tablet (25 mg total) by mouth daily.  . clopidogrel (PLAVIX) 75 MG tablet TAKE ONE TABLET BY MOUTH DAILY  . clotrimazole-betamethasone (LOTRISONE) cream APPLY AS DIRECTED TWICE DAILY.  . furosemide (LASIX) 40 MG tablet Take 1 tablet (40 mg total) by mouth as needed for edema (weight gain).  . hydrocortisone (ANUSOL-HC) 2.5 % rectal cream Place 1 application rectally 2 (two) times daily. As needed for rectal discomfort.  Marland Kitchen lisinopril (ZESTRIL) 40 MG tablet Take 1 tablet (40 mg total) by mouth daily.  . metoprolol succinate (TOPROL XL) 50 MG 24 hr tablet Take 1 tablet (50 mg total) by mouth daily. Take with or immediately following a meal.  . Multiple Vitamin (MULTIVITAMIN) tablet Take 1 tablet by mouth daily.  . pantoprazole (PROTONIX) 40 MG tablet Take 1 tablet (40 mg total) by mouth daily. 30 minutes before breakfast  . pregabalin (LYRICA) 100 MG capsule Take 1 capsule (100 mg total) by mouth at bedtime as needed. Patient not sure of strength  . rosuvastatin (CRESTOR) 5 MG tablet TAKE ONE TABLET BY MOUTH DAILY.   No facility-administered encounter medications on file as of 05/16/2020.   ALLERGIES: Allergies  Allergen Reactions  . Nsaids Other (See Comments)    AVOID all NSAID due to history  of GI bleeding  . Atorvastatin Other (See  Comments)    Joint & muscle pain    VACCINATION STATUS: Immunization History  Administered Date(s) Administered  . Pneumococcal Conjugate-13 10/02/2016  . Pneumococcal Polysaccharide-23 08/25/2013  . Zoster Recombinat (Shingrix) 03/17/2018    Thyroid Problem Presents for follow-up visit. Symptoms include fatigue. Patient reports no anxiety, cold intolerance, constipation, depressed mood, heat intolerance, palpitations, tremors, weight gain or weight loss. The symptoms have been stable.   Shelly [name] is 78 y.o. female who is seen in follow-up after she was seen in consultation for subclinical hyperthyroidism.    PMD: [name], [name], MD.  Due to septic symptoms of thyrotoxicosis, she was started on low-dose methimazole, which was since tapered down.  She has been off the Methimazole for some time now.   She denies any history of goiter, no family history of thyroid dysfunction or thyroid malignancy.  Her thyroid uptake and scan on June 14, 2019 showed 24-hour uptake of 10.3% which is normal.  There was asymmetric thyroid activity with generally increased uptake in the right lobe.  No focal hot or cold nodules identified.  Her medical history includes hypertension and hyperlipidemia on treatment.  Review of systems  Constitutional: + Minimally fluctuating body weight,  current Body mass index is 43.67 kg/m. , +fatigue, no subjective hyperthermia, no subjective hypothermia Eyes: no blurry vision, no xerophthalmia ENT: no sore throat, no nodules palpated in throat, no dysphagia/odynophagia, no hoarseness Cardiovascular: no chest pain, no shortness of breath, no palpitations, no leg swelling Respiratory: no cough, no shortness of breath Gastrointestinal: no nausea/vomiting/diarrhea Musculoskeletal: no muscle/joint aches Skin: + rash (has shingles to right chest- on Lyrica for nerve pain), no hyperemia Neurological: no tremors, no  numbness, no tingling, no dizziness Psychiatric: no depression, no anxiety  Objective:    BP (!) 160/74 (BP Location: Right Arm, Patient Position: Sitting)   Pulse 79   Ht 5\' 4"  (1.626 m)   Wt 254 lb 6.4 oz (115.4 kg)   BMI 43.67 kg/m   Wt Readings from Last 3 Encounters:  05/16/20 254 lb 6.4 oz (115.4 kg)  04/30/20 238 lb (108 kg)  04/24/20 255 lb (115.7 kg)    BP Readings from Last 3 Encounters:  05/16/20 (!) 160/74  04/30/20 (!) 118/54  04/24/20 (!) 164/70     Physical Exam- Limited  Constitutional:  Body mass index is 43.67 kg/m. , not in acute distress, normal state of mind Eyes:  EOMI, no exophthalmos Neck: Supple Cardiovascular: RRR, no murmers, rubs, or gallops, no edema Respiratory: Adequate breathing efforts, no crackles, rales, rhonchi, or wheezing Musculoskeletal: no gross deformities, strength intact in all four extremities, no gross restriction of joint movements Skin:  no hyperemia Neurological: no tremor with outstretched hands  CMP     Component Value Date/Time   NA 141 04/30/2020 1510   K 4.8 04/30/2020 1510   CL 109 (H) 04/30/2020 1510   CO2 21 04/30/2020 1510   GLUCOSE 105 (H) 04/30/2020 1510   GLUCOSE 92 12/21/2018 1547   BUN 31 (H) 04/30/2020 1510   CREATININE 1.67 (H) 04/30/2020 1510   CREATININE 1.31 (H) 12/21/2018 1547   CALCIUM 9.5 04/30/2020 1510   PROT 6.7 04/30/2020 1510   ALBUMIN 3.6 (L) 04/30/2020 1510   AST 11 04/30/2020 1510   ALT 7 04/30/2020 1510   ALKPHOS 89 04/30/2020 1510   BILITOT <0.2 04/30/2020 1510   GFRNONAA 29 (L) 04/30/2020 1510   GFRAA 34 (L) 04/30/2020 1510     Diabetic Labs (most recent): Lab  Results  Component Value Date   HGBA1C 6.0 (H) 12/09/2015   HGBA1C 6.0 (H) 08/23/2013     Lipid Panel ( most recent) Lipid Panel     Component Value Date/Time   CHOL 163 01/31/2020 1454   TRIG 143 01/31/2020 1454   HDL 42 01/31/2020 1454   CHOLHDL 3.9 01/31/2020 1454   CHOLHDL 2.6 11/20/2016 0854   VLDL 11  11/20/2016 0854   LDLCALC 96 01/31/2020 1454   LABVLDL 25 01/31/2020 1454      Lab Results  Component Value Date   TSH 0.227 (L) 05/03/2020   TSH 0.218 (L) 04/30/2020   TSH 0.384 (L) 01/31/2020   TSH 0.55 01/02/2020   TSH 0.511 10/31/2019   TSH 0.28 (L) 09/23/2019   TSH 0.13 (L) 05/30/2019   TSH 0.347 (L) 04/06/2019   TSH 0.324 (L) 12/09/2015   TSH 0.292 (L) 10/19/2013   FREET4 0.97 05/03/2020   FREET4 1.0 01/02/2020   FREET4 0.9 09/23/2019   FREET4 0.9 05/30/2019   FREET4 1.04 12/11/2015   FREET4 1.09 10/19/2013      Assessment & Plan:   1. Subclinical hyperthyroidism -Her previsit labs are consistent with stable thyroid profile off of the Methimazole.  TSH is minimally suppressed yet Free T4 and Free T3 are normal.  There is no need for her to restart Methimazole at this time.  Will monitor TFTs in 3 months.  - she is advised to maintain close follow up with [name], [name], MD for primary care needs.      - Time spent on this patient care encounter:  20 minutes of which 50% was spent in  counseling and the rest reviewing  her current and  previous labs / studies and medications  doses and developing a plan for long term care. Shelly [name]  participated in the discussions, expressed understanding, and voiced agreement with the above plans.  All questions were answered to her satisfaction. she is encouraged to contact clinic should she have any questions or concerns prior to her return visit.   Follow up plan: Return in about 3 months (around 08/14/2020) for Thyroid follow up, Previsit labs.   Rayetta Pigg, [facility] [address] [place], [place] [address] Phone: [phone] Fax: [phone]   05/16/2020, 3:22 PM

## 2020-06-05 ENCOUNTER — Telehealth: Payer: Medicare Other

## 2020-06-06 ENCOUNTER — Ambulatory Visit: Payer: Medicare Other | Admitting: Gastroenterology

## 2020-07-04 DIAGNOSIS — N1832 Chronic kidney disease, stage 3b: Secondary | ICD-10-CM | POA: Diagnosis not present

## 2020-07-04 DIAGNOSIS — D638 Anemia in other chronic diseases classified elsewhere: Secondary | ICD-10-CM | POA: Diagnosis not present

## 2020-07-04 DIAGNOSIS — I5032 Chronic diastolic (congestive) heart failure: Secondary | ICD-10-CM | POA: Diagnosis not present

## 2020-07-04 DIAGNOSIS — E0590 Thyrotoxicosis, unspecified without thyrotoxic crisis or storm: Secondary | ICD-10-CM | POA: Diagnosis not present

## 2020-07-04 DIAGNOSIS — E872 Acidosis: Secondary | ICD-10-CM | POA: Diagnosis not present

## 2020-07-04 DIAGNOSIS — I129 Hypertensive chronic kidney disease with stage 1 through stage 4 chronic kidney disease, or unspecified chronic kidney disease: Secondary | ICD-10-CM | POA: Diagnosis not present

## 2020-07-05 LAB — T3, FREE: T3, Free: 2.9 pg/mL (ref 2.0–4.4)

## 2020-07-05 LAB — TSH: TSH: 0.227 u[IU]/mL — ABNORMAL LOW (ref 0.450–4.500)

## 2020-07-05 LAB — T4, FREE: Free T4: 1 ng/dL (ref 0.82–1.77)

## 2020-07-09 ENCOUNTER — Ambulatory Visit (INDEPENDENT_AMBULATORY_CARE_PROVIDER_SITE_OTHER): Payer: Medicare Other | Admitting: Licensed Clinical Social Worker

## 2020-07-09 DIAGNOSIS — K219 Gastro-esophageal reflux disease without esophagitis: Secondary | ICD-10-CM

## 2020-07-09 DIAGNOSIS — M1712 Unilateral primary osteoarthritis, left knee: Secondary | ICD-10-CM | POA: Diagnosis not present

## 2020-07-09 DIAGNOSIS — F419 Anxiety disorder, unspecified: Secondary | ICD-10-CM

## 2020-07-09 DIAGNOSIS — I10 Essential (primary) hypertension: Secondary | ICD-10-CM | POA: Diagnosis not present

## 2020-07-09 DIAGNOSIS — I2510 Atherosclerotic heart disease of native coronary artery without angina pectoris: Secondary | ICD-10-CM

## 2020-07-09 DIAGNOSIS — N1832 Chronic kidney disease, stage 3b: Secondary | ICD-10-CM

## 2020-07-09 NOTE — Chronic Care Management (AMB) (Signed)
Chronic Care Management    Clinical Social Work Follow Up Note  07/09/2020 Name: Shelly [name] MRN: [id number] DOB: 07/24/1940  Shelly [name] is a 80 y.o. year old female who is a primary care patient of [name], [name], MD. The CCM team was consulted for assistance with [employer] .   Review of patient status, including review of consultants reports, other relevant assessments, and collaboration with appropriate care team members and the patient's provider was performed as part of comprehensive patient evaluation and provision of chronic care management services.    SDOH (Social Determinants of Health) assessments performed: No; risk for depression; risk for tobacco use; risk for financial strain; risk for stress; risk for physical inactivity  [place] Office Visit from 10/31/2019 in [place]  PHQ-9 Total Score 10      GAD 7 : Generalized Anxiety Score 10/31/2019 09/28/2019 08/29/2019  Nervous, Anxious, on Edge '1 1 1  '$ Control/stop worrying '2 2 1  '$ Worry too much - different things '2 2 1  '$ Trouble relaxing 0 0 0  Restless 0 0 0  Easily annoyed or irritable 0 0 0  Afraid - awful might happen 0 0 0  Total GAD 7 Score '5 5 3  '$ Anxiety Difficulty - - Somewhat difficult    Outpatient Encounter Medications as of 07/09/2020  Medication Sig  . amLODipine (NORVASC) 5 MG tablet Take 0.5 tablets (2.5 mg total) by mouth daily.  . cetirizine (ZYRTEC) 10 MG tablet Take 10 mg by mouth daily.  . chlorthalidone (HYGROTON) 25 MG tablet Take 1 tablet (25 mg total) by mouth daily.  . clopidogrel (PLAVIX) 75 MG tablet TAKE ONE TABLET BY MOUTH DAILY  . clotrimazole-betamethasone (LOTRISONE) cream APPLY AS DIRECTED TWICE DAILY.  . furosemide (LASIX) 40 MG tablet Take 1 tablet (40 mg total) by mouth as needed for edema (weight gain).  . hydrocortisone (ANUSOL-HC) 2.5 % rectal cream Place 1 application rectally 2 (two) times daily. As needed for rectal  discomfort.  Marland Kitchen lisinopril (ZESTRIL) 40 MG tablet Take 1 tablet (40 mg total) by mouth daily.  . metoprolol succinate (TOPROL XL) 50 MG 24 hr tablet Take 1 tablet (50 mg total) by mouth daily. Take with or immediately following a meal.  . Multiple Vitamin (MULTIVITAMIN) tablet Take 1 tablet by mouth daily.  . pantoprazole (PROTONIX) 40 MG tablet Take 1 tablet (40 mg total) by mouth daily. 30 minutes before breakfast  . pregabalin (LYRICA) 100 MG capsule Take 1 capsule (100 mg total) by mouth at bedtime as needed. Patient not sure of strength  . rosuvastatin (CRESTOR) 5 MG tablet TAKE ONE TABLET BY MOUTH DAILY.   No facility-administered encounter medications on file as of 07/09/2020.    Goals    .  Client will talk with LCSW in next 30 days about depression sympotms related to health conditions faced (pt-stated)      CARE PLAN ENTRY (see longtitudinal plan of care for additional care plan information)  Current Barriers:  . Decreased appetite . Mobility issues in client with Chronic diagnoses of HTN, GERD,Anxiety,CAD, CKD,OA . Grief issues faced  Clinical Social Work Clinical Goal(s):  Marland Kitchen LCSW will talk with client in next 30 days to discuss depression symptoms of client related to her health conditions  Interventions: Talked with client about CCM services Talked with client about her upcoming medical appointments Provided counseling support for client (she experienced the death of one daughter one year ago and experienced death of another  daughter 4 years ago) Encouraged client to talk with RNCM as needed to address nursing issues she faced Talked with client about client decreased energy Talked with client about grief support resources through [facility]  Client and LCSW talked about client's pain issues related to shingles issues she faces Talked with client about sleeping challenges of client Talked with client about transport needs of client Talked with client  about family support Talked with client about her support with Gastroenterologist  Talked with client about her upcoming appointment with podiatrist   Patient Self Care Activities:   Takes medications as prescribed Attends scheduled medical appointments  . Patient self care deficits: Marland Kitchen Mobility issues   Initial goal documentation     Follow Up Plan: LCSW to call client in next 4 weeks to talk with client about depression symptoms faced by client  Norva Riffle.Kellyn Mansfield MSW, LCSW Licensed Clinical Social Worker [facility] Care Management [phone]

## 2020-07-09 NOTE — Patient Instructions (Addendum)
Licensed Clinical Social Worker Visit Information  Goals we discussed today:   .  Client will talk with LCSW in next 30 days about depression sympotms related to health conditions faced (pt-stated)        CARE PLAN ENTRY (see longtitudinal plan of care for additional care plan information)  Current Barriers:   Decreased appetite  Mobility issues in client with Chronic diagnoses of HTN, GERD,Anxiety,CAD, CKD,OA  Grief issues faced  Clinical Social Work Clinical Goal(s):   LCSW will talk with client in next 30 days to discuss depression symptoms of client related to her health conditions  Interventions: Talked with client about CCM services Talked with client about her upcoming medical appointments Provided counseling support for client (she experienced the death of one daughter one year ago and experienced death of another daughter 4 years ago) Encouraged client to talk with RNCM as needed to address nursing issues she faced Talked with client about client decreased energy Talked with client about grief support resources through [facility]  Client and LCSW talked about client's pain issues related to shingles issues she faces Talked with client about sleeping challenges of client Talked with client about transport needs of client Talked with client about family support Talked with client about her support with Gastroenterologist  Talked with client about her upcoming appointment with podiatrist   Patient Self Care Activities:   Takes medications as prescribed Attends scheduled medical appointments   Patient self care deficits:  Mobility issues   Initial goal documentation     Follow Up Plan: LCSW to call client in next 4 weeks to talk with client about depression symptoms faced by client  Materials Provided: No  The patient verbalized understanding of instructions provided today and declined a print copy of patient instruction  materials.   Norva Riffle.Aniko Finnigan MSW, LCSW Licensed Clinical Social Worker [facility] Care Management [phone]

## 2020-07-12 ENCOUNTER — Other Ambulatory Visit: Payer: Self-pay | Admitting: Family Medicine

## 2020-07-13 ENCOUNTER — Ambulatory Visit: Payer: Medicare Other | Admitting: Gastroenterology

## 2020-07-13 ENCOUNTER — Other Ambulatory Visit: Payer: Self-pay

## 2020-07-13 ENCOUNTER — Encounter: Payer: Self-pay | Admitting: Gastroenterology

## 2020-07-13 VITALS — BP 189/79 | HR 97 | Temp 96.8°F | Ht 64.0 in | Wt 251.4 lb

## 2020-07-13 DIAGNOSIS — K219 Gastro-esophageal reflux disease without esophagitis: Secondary | ICD-10-CM | POA: Diagnosis not present

## 2020-07-13 NOTE — Patient Instructions (Signed)
Continue Protonix once daily.  It was good to see you!  Please call if any concerns!  We will see you in 3 months!  I enjoyed seeing you again today! As you know, I value our relationship and want to provide genuine, compassionate, and quality care. I welcome your feedback. If you receive a survey regarding your visit,  I greatly appreciate you taking time to fill this out. See you next time!  Annitta Needs, PhD, ANP-BC [facility]

## 2020-07-13 NOTE — Progress Notes (Signed)
Referring Provider: [name], [name], MD Primary Care Physician:  [name], [name], MD Primary GI: Dr. Abbey Chatters   Chief Complaint  Patient presents with  . Gastroesophageal Reflux    ok    HPI:   Shelly [name] is a 80 y.o. female presenting today with a history of GERD, diverticular bleed several years ago, IDA with capsule study showing enteritis due to aspirin use, chronic GERD.Prefers frequent visits with health care team. Chronic normocytic anemia with chronic diease component.   No abdominal pain, N/V, overt GI bleeding. Good appetite. Taking Protonix daily. If does not take, she will have GERD flare. Still dealing with shingles but improving. Cheerios keeps constipation at [place].   Past Medical History:  Diagnosis Date  . Allergy   . Anemia    GI bleed  . Anginal pain ([place])   . Anxiety   . Arthritis   . Blood transfusion without reported diagnosis   . Cataract   . Chronic kidney disease   . Coronary artery disease    SEVERE  . GERD (gastroesophageal reflux disease)   . GI bleed 12/2013  . Goiter   . Hyperlipidemia   . Hypertension   . Myocardial infarct ([place])   . Myocardial infarction ([place]) 08/2013  . Neuralgia of right lower extremity 07/13/2017  . Pancolonic diverticulosis 12/2013  . Post herpetic neuralgia     Past Surgical History:  Procedure Laterality Date  . CARPAL TUNNEL RELEASE Bilateral   . COLONOSCOPY N/A 01/13/2014   Dr. Hung:pandiverticulosis   . COLONOSCOPY N/A 12/14/2015   Dr. Michail Sermon: pancolonic diverticulosis, internal hemorrhoids   . CORNEAL TRANSPLANT    . CORONARY ANGIOPLASTY  08/2013  . ESOPHAGOGASTRODUODENOSCOPY N/A 12/14/2015   Dr. Michail Sermon: normal   . GIVENS CAPSULE STUDY N/A 08/13/2016   Dr. Oneida Alar: enteritis due to aspirin.   Marland Kitchen HEEL SPUR EXCISION    . LEFT HEART CATHETERIZATION WITH CORONARY ANGIOGRAM N/A 08/23/2013   Procedure: LEFT HEART CATHETERIZATION WITH CORONARY ANGIOGRAM;  Surgeon: Peter M Martinique, MD;  Location[facility]  CATH LAB;  Service: Cardiovascular;  Laterality: N/A;  . PERCUTANEOUS CORONARY STENT INTERVENTION (PCI-S) N/A 08/24/2013   Procedure: PERCUTANEOUS CORONARY STENT INTERVENTION (PCI-S);  Surgeon: Peter M Martinique, MD;  Location: [facility] CATH LAB;  Service: Cardiovascular;  Laterality: N/A;  . TONSILLECTOMY    . TUBAL LIGATION      Current Outpatient Medications  Medication Sig Dispense Refill  . amLODipine (NORVASC) 5 MG tablet Take 0.5 tablets (2.5 mg total) by mouth daily. 45 tablet 3  . cetirizine (ZYRTEC) 10 MG tablet Take 10 mg by mouth daily.    . chlorthalidone (HYGROTON) 25 MG tablet Take 1 tablet (25 mg total) by mouth daily. 90 tablet 3  . clopidogrel (PLAVIX) 75 MG tablet TAKE ONE TABLET BY MOUTH DAILY 90 tablet 3  . clotrimazole-betamethasone (LOTRISONE) cream APPLY AS DIRECTED TWICE DAILY. 45 g 2  . furosemide (LASIX) 40 MG tablet Take 1 tablet (40 mg total) by mouth as needed for edema (weight gain). 30 tablet 6  . hydrocortisone (ANUSOL-HC) 2.5 % rectal cream Place 1 application rectally 2 (two) times daily. As needed for rectal discomfort. 30 g 1  . lisinopril (ZESTRIL) 40 MG tablet Take 1 tablet (40 mg total) by mouth daily. 90 tablet 3  . metoprolol succinate (TOPROL XL) 50 MG 24 hr tablet Take 1 tablet (50 mg total) by mouth daily. Take with or immediately following a meal. 90 tablet 2  . Multiple Vitamin (  MULTIVITAMIN) tablet Take 1 tablet by mouth daily.    . pantoprazole (PROTONIX) 40 MG tablet Take 1 tablet (40 mg total) by mouth daily. 30 minutes before breakfast (Patient taking differently: Take 40 mg by mouth as needed. 30 minutes before breakfast) 90 tablet 3  . pregabalin (LYRICA) 100 MG capsule Take 1 capsule (100 mg total) by mouth at bedtime as needed. Patient not sure of strength (Patient taking differently: Take 100 mg by mouth at bedtime. Patient not sure of strength) 30 capsule 5  . rosuvastatin (CRESTOR) 5 MG tablet TAKE ONE TABLET BY MOUTH DAILY. 90 tablet 3   No  current facility-administered medications for this visit.    Allergies as of 07/13/2020 - Review Complete 07/13/2020  Allergen Reaction Noted  . Nsaids Other (See Comments) 07/25/2016  . Atorvastatin Other (See Comments) 11/30/2018    Family History  Problem Relation Age of Onset  . Hypertension Brother   . Transient ischemic attack Brother   . Cancer Mother        unsure of type   . Other Father        grangrene   . Asthma Sister   . Stroke Sister   . COPD Sister   . Stroke Sister   . Early death Brother   . Early death Brother   . Liver cancer Daughter   . Cancer Daughter   . Hernia Son        Umbilical  . GI problems Son   . Pancreatic disease Daughter        pancreatectomy  . Cancer Daughter   . Diverticulitis Daughter   . Arthritis Daughter        knee   . Arthritis Daughter        shoulder   . Anemia Daughter   . GER disease Son     Social History   Socioeconomic History  . Marital status: Divorced    Spouse name: Not on file  . Number of children: 8  . Years of education: 85  . Highest education level: 11th grade  Occupational History  . 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 Smoking status: Never Smoker  . Smokeless tobacco: Never Used  Vaping Use  . Vaping Use: Never used  Substance and Sexual Activity  . Alcohol use: No    Alcohol/week: 0.0 standard drinks  . Drug use: No  . Sexual activity: Not Currently    Birth control/protection: Surgical    Comment: divorced, lives with daughter and granddaughters  Other Topics Concern  . 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   No daily caffeine use.   Social Determinants of Health   Financial Resource Strain: Low Risk   . Difficulty of Paying Living Expenses: Not very hard  Food Insecurity: No Food Insecurity  . Worried About  Charity fundraiser in the Last Year: Never true  . Ran Out of Food in the Last Year: Never true  Transportation Needs: No Transportation Needs  . Lack of Transportation (Medical): No  . Lack of Transportation (Non-Medical): No  Physical Activity: Inactive  . Days of Exercise per Week: 0 days  . Minutes of Exercise per Session: 0 min  Stress: No Stress Concern Present  . Feeling of Stress :  Not at all  Social Connections: Moderately Integrated  . Frequency of Communication with Friends and Family: More than three times a week  . Frequency of Social Gatherings with Friends and Family: More than three times a week  . Attends Religious Services: More than 4 times per year  . Active Member of Clubs or Organizations: Yes  . Attends Archivist Meetings: More than 4 times per year  . Marital Status: Divorced    Review of Systems: Gen: Denies fever, chills, anorexia. Denies fatigue, weakness, weight loss.  CV: Denies chest pain, palpitations, syncope, peripheral edema, and claudication. Resp: Denies dyspnea at rest, cough, wheezing, coughing up blood, and pleurisy. GI: see HPI Derm: Denies rash, itching, dry skin Psych: Denies depression, anxiety, memory loss, confusion. No homicidal or suicidal ideation.  Heme: Denies bruising, bleeding, and enlarged lymph nodes.  Physical Exam: BP (!) 189/79   Pulse 97   Temp (!) 96.8 F (36 C) (Temporal)   Ht '5\' 4"'$  (1.626 m)   Wt 251 lb 6.4 oz (114 kg)   BMI 43.15 kg/m  General:   Alert and oriented. No distress noted. Pleasant and cooperative.  Head:  Normocephalic and atraumatic. Eyes:  Conjuctiva clear without scleral icterus. Mouth:  Mask in place Abdomen:  +BS, soft, non-tender and non-distended. Sitting in chair Msk:  Symmetrical without gross deformities. Normal posture. Extremities:  Chronic lower extremity edema Neurologic:  Alert and  oriented x4 Psych:  Alert and cooperative. Normal mood and  affect.  ASSESSMENT/PLAN: Shelly [name] is a 80 y.o. female presenting today with history of chronic GERD for routine follow-up. Anemia of chronic disease noted. No overt GI bleeding. IDA in past with enteritis due to aspirin use but resolved.  Continues on Protonix daily without alarm signs/symptoms. She desires every 3 month visits. Doing very well from a GI standpoint. Will see her back in 3 months or sooner if needed.   Annitta Needs, PhD, ANP-BC [facility]

## 2020-07-17 DIAGNOSIS — M25579 Pain in unspecified ankle and joints of unspecified foot: Secondary | ICD-10-CM | POA: Diagnosis not present

## 2020-07-17 DIAGNOSIS — I739 Peripheral vascular disease, unspecified: Secondary | ICD-10-CM | POA: Diagnosis not present

## 2020-07-17 DIAGNOSIS — M2011 Hallux valgus (acquired), right foot: Secondary | ICD-10-CM | POA: Diagnosis not present

## 2020-07-17 DIAGNOSIS — M2012 Hallux valgus (acquired), left foot: Secondary | ICD-10-CM | POA: Diagnosis not present

## 2020-07-19 DIAGNOSIS — I129 Hypertensive chronic kidney disease with stage 1 through stage 4 chronic kidney disease, or unspecified chronic kidney disease: Secondary | ICD-10-CM | POA: Diagnosis not present

## 2020-07-19 DIAGNOSIS — R809 Proteinuria, unspecified: Secondary | ICD-10-CM | POA: Diagnosis not present

## 2020-07-19 DIAGNOSIS — E872 Acidosis: Secondary | ICD-10-CM | POA: Diagnosis not present

## 2020-07-19 DIAGNOSIS — N184 Chronic kidney disease, stage 4 (severe): Secondary | ICD-10-CM | POA: Diagnosis not present

## 2020-07-19 DIAGNOSIS — D638 Anemia in other chronic diseases classified elsewhere: Secondary | ICD-10-CM | POA: Diagnosis not present

## 2020-07-19 DIAGNOSIS — I5032 Chronic diastolic (congestive) heart failure: Secondary | ICD-10-CM | POA: Diagnosis not present

## 2020-07-27 ENCOUNTER — Other Ambulatory Visit: Payer: Self-pay | Admitting: Family Medicine

## 2020-07-27 DIAGNOSIS — B0229 Other postherpetic nervous system involvement: Secondary | ICD-10-CM

## 2020-07-30 ENCOUNTER — Ambulatory Visit (INDEPENDENT_AMBULATORY_CARE_PROVIDER_SITE_OTHER): Payer: Medicare Other | Admitting: Family Medicine

## 2020-07-30 ENCOUNTER — Telehealth: Payer: Self-pay | Admitting: Family Medicine

## 2020-07-30 ENCOUNTER — Other Ambulatory Visit: Payer: Self-pay

## 2020-07-30 ENCOUNTER — Encounter: Payer: Self-pay | Admitting: Family Medicine

## 2020-07-30 VITALS — BP 132/61 | HR 78 | Ht 64.0 in | Wt 250.0 lb

## 2020-07-30 DIAGNOSIS — N1832 Chronic kidney disease, stage 3b: Secondary | ICD-10-CM

## 2020-07-30 DIAGNOSIS — I10 Essential (primary) hypertension: Secondary | ICD-10-CM

## 2020-07-30 DIAGNOSIS — E0590 Thyrotoxicosis, unspecified without thyrotoxic crisis or storm: Secondary | ICD-10-CM | POA: Diagnosis not present

## 2020-07-30 DIAGNOSIS — B0229 Other postherpetic nervous system involvement: Secondary | ICD-10-CM

## 2020-07-30 DIAGNOSIS — M1712 Unilateral primary osteoarthritis, left knee: Secondary | ICD-10-CM | POA: Diagnosis not present

## 2020-07-30 MED ORDER — PREGABALIN 100 MG PO CAPS: 100.0000 mg | ORAL_CAPSULE | Freq: Every evening | ORAL | 5 refills | Status: DC | PRN

## 2020-07-30 MED ORDER — DICLOFENAC SODIUM 1 % EX GEL: 2.0000 g | Freq: Four times a day (QID) | CUTANEOUS | 2 refills | Status: DC

## 2020-07-30 NOTE — Progress Notes (Signed)
BP 132/61   Pulse 78   Ht $R'5\' 4"'ON$  (1.626 m)   Wt 250 lb (113.4 kg)   SpO2 98%   BMI 42.91 kg/m    Subjective:   Patient ID: Shelly [name], female    DOB: 10-21-1940, 80 y.o.   MRN: [id number]  HPI: Shelly [name] is a 80 y.o. female presenting on 07/30/2020 for Medical Management of Chronic Issues, Hypothyroidism, Gastroesophageal Reflux, and Hypertension   HPI Subclinical hyperthyroidism recheck Patient is coming in for thyroid recheck today as well. They deny any issues with hair changes or heat or cold problems or diarrhea or constipation. They deny any chest pain or palpitations. They are currently on no medication and sees endocrinology.  Hypertension Patient is currently on chlorthalidone and lisinopril metoprolol and amlodipine and furosemide as needed, and their blood pressure today is 132/61. Patient denies any lightheadedness or dizziness. Patient denies headaches, blurred vision, chest pains, shortness of breath, or weakness. Denies any side effects from medication and is content with current medication.   ckd3 Patient is CKD stage III and sees nephrology.  Patient has postherpetic neuralgia Followed for postherpetic neuralgia.  Patient takes Lyrica and says it helps some  Left knee osteoarthritis Patient has chronic left knee osteoarthritis, she was taking hydrocodone for it but said it was not helping any does have side effects and says it was a gel or cream that they have previously given her for the orthopedic and she would like to try that.  It sounds like diclofenac gel but she will call back.  Relevant past medical, surgical, family and social history reviewed and updated as indicated. Interim medical history since our last visit reviewed. Allergies and medications reviewed and updated.  Review of Systems  Constitutional: Negative for chills and fever.  Eyes: Negative for redness and visual disturbance.  Respiratory: Negative for chest tightness and  shortness of breath.   Cardiovascular: Negative for chest pain and leg swelling.  Musculoskeletal: Positive for arthralgias. Negative for back pain and gait problem.  Skin: Negative for rash.  Neurological: Positive for numbness. Negative for weakness, light-headedness and headaches.  Psychiatric/Behavioral: Negative for agitation and behavioral problems.  All other systems reviewed and are negative.   Per HPI unless specifically indicated above   Allergies as of 07/30/2020      Reactions   Nsaids Other (See Comments)   AVOID all NSAID due to history of GI bleeding   Atorvastatin Other (See Comments)   Joint & muscle pain      Medication List       Accurate as of July 30, 2020  1:54 PM. If you have any questions, ask your nurse or doctor.        amLODipine 5 MG tablet Commonly known as: NORVASC Take 0.5 tablets (2.5 mg total) by mouth daily.   cetirizine 10 MG tablet Commonly known as: ZYRTEC Take 10 mg by mouth daily.   chlorthalidone 25 MG tablet Commonly known as: HYGROTON Take 1 tablet (25 mg total) by mouth daily.   clopidogrel 75 MG tablet Commonly known as: PLAVIX TAKE ONE TABLET BY MOUTH DAILY   clotrimazole-betamethasone cream Commonly known as: LOTRISONE APPLY AS DIRECTED TWICE DAILY.   furosemide 40 MG tablet Commonly known as: Lasix Take 1 tablet (40 mg total) by mouth as needed for edema (weight gain).   hydrocortisone 2.5 % rectal cream Commonly known as: ANUSOL-HC Place 1 application rectally 2 (two) times daily. As needed for rectal discomfort.  lisinopril 40 MG tablet Commonly known as: ZESTRIL Take 1 tablet (40 mg total) by mouth daily.   metoprolol succinate 50 MG 24 hr tablet Commonly known as: Toprol XL Take 1 tablet (50 mg total) by mouth daily. Take with or immediately following a meal.   multivitamin tablet Take 1 tablet by mouth daily.   pantoprazole 40 MG tablet Commonly known as: PROTONIX Take 1 tablet (40 mg total) by  mouth daily. 30 minutes before breakfast What changed:   when to take this  reasons to take this   pregabalin 100 MG capsule Commonly known as: Lyrica Take 1 capsule (100 mg total) by mouth at bedtime as needed. Patient not sure of strength What changed: when to take this   rosuvastatin 5 MG tablet Commonly known as: CRESTOR TAKE ONE TABLET BY MOUTH DAILY.        Objective:   BP 132/61   Pulse 78   Ht $R'5\' 4"'nA$  (1.626 m)   Wt 250 lb (113.4 kg)   SpO2 98%   BMI 42.91 kg/m   Wt Readings from Last 3 Encounters:  07/30/20 250 lb (113.4 kg)  07/13/20 251 lb 6.4 oz (114 kg)  05/16/20 254 lb 6.4 oz (115.4 kg)    Physical Exam Vitals and nursing note reviewed.  Constitutional:      General: She is not in acute distress.    Appearance: She is well-developed. She is not diaphoretic.  Eyes:     Conjunctiva/sclera: Conjunctivae normal.  Cardiovascular:     Rate and Rhythm: Normal rate and regular rhythm.     Heart sounds: Normal heart sounds. No murmur heard.   Pulmonary:     Effort: Pulmonary effort is normal. No respiratory distress.     Breath sounds: Normal breath sounds. No wheezing.  Musculoskeletal:        General: No tenderness. Normal range of motion.  Skin:    General: Skin is warm and dry.     Findings: No rash.  Neurological:     Mental Status: She is alert and oriented to person, place, and time.     Coordination: Coordination normal.  Psychiatric:        Behavior: Behavior normal.       Assessment & Plan:   Problem List Items Addressed This Visit      Cardiovascular and Mediastinum   Essential hypertension   Relevant Orders   CBC with Differential/Platelet   CMP14+EGFR   Lipid panel     Endocrine   Subclinical hyperthyroidism - Primary   Relevant Orders   CBC with Differential/Platelet   CMP14+EGFR     Nervous and Auditory   Postherpetic neuralgia   Relevant Medications   pregabalin (LYRICA) 100 MG capsule     Musculoskeletal and  Integument   Osteoarthritis left knee     Genitourinary   CKD (chronic kidney disease), stage III ([place])   Relevant Orders   CMP14+EGFR      Patient is to follow-up with her endocrinologist and her nephrologist and continue with them, we will do some blood work today but defer the thyroid to the endocrinology.  Continue Lyrica. Follow up plan: Return in about 6 months (around 01/30/2021), or if symptoms worsen or fail to improve, for Subclinical hyperthyroidism and osteoarthritis and hypertension.  Counseling provided for all of the vaccine components No orders of the defined types were placed in this encounter.   Caryl Pina, MD [facility] 07/30/2020, 1:54 PM

## 2020-07-30 NOTE — Telephone Encounter (Signed)
Pt was calling to let [name] know - Diclofenac (1% gel) is what they had discussed

## 2020-07-30 NOTE — Telephone Encounter (Signed)
Sent Voltaren gel for the patient

## 2020-07-31 DIAGNOSIS — M25579 Pain in unspecified ankle and joints of unspecified foot: Secondary | ICD-10-CM | POA: Diagnosis not present

## 2020-07-31 DIAGNOSIS — M2011 Hallux valgus (acquired), right foot: Secondary | ICD-10-CM | POA: Diagnosis not present

## 2020-07-31 DIAGNOSIS — I739 Peripheral vascular disease, unspecified: Secondary | ICD-10-CM | POA: Diagnosis not present

## 2020-07-31 DIAGNOSIS — M2012 Hallux valgus (acquired), left foot: Secondary | ICD-10-CM | POA: Diagnosis not present

## 2020-07-31 LAB — CMP14+EGFR
ALT: 9 IU/L (ref 0–32)
AST: 14 IU/L (ref 0–40)
Albumin/Globulin Ratio: 1.2 (ref 1.2–2.2)
Albumin: 4.1 g/dL (ref 3.7–4.7)
Alkaline Phosphatase: 92 IU/L (ref 44–121)
BUN/Creatinine Ratio: 23 (ref 12–28)
BUN: 39 mg/dL — ABNORMAL HIGH (ref 8–27)
Bilirubin Total: <0.2 mg/dL (ref 0.0–1.2)
CO2: 18 mmol/L — ABNORMAL LOW (ref 20–29)
Calcium: 9.7 mg/dL (ref 8.7–10.3)
Chloride: 108 mmol/L — ABNORMAL HIGH (ref 96–106)
Creatinine, Ser: 1.73 mg/dL — ABNORMAL HIGH (ref 0.57–1.00)
Globulin, Total: 3.3 g/dL (ref 1.5–4.5)
Glucose: 102 mg/dL — ABNORMAL HIGH (ref 65–99)
Potassium: 4.9 mmol/L (ref 3.5–5.2)
Sodium: 140 mmol/L (ref 134–144)
Total Protein: 7.4 g/dL (ref 6.0–8.5)
eGFR: 30 mL/min/{1.73_m2} — ABNORMAL LOW (ref >59)

## 2020-07-31 LAB — LIPID PANEL
Chol/HDL Ratio: 3.9 ratio (ref 0.0–4.4)
Cholesterol, Total: 186 mg/dL (ref 100–199)
HDL: 48 mg/dL (ref >39)
LDL Chol Calc (NIH): 118 mg/dL — ABNORMAL HIGH (ref 0–99)
Triglycerides: 113 mg/dL (ref 0–149)
VLDL Cholesterol Cal: 20 mg/dL (ref 5–40)

## 2020-07-31 LAB — CBC WITH DIFFERENTIAL/PLATELET
Basophils Absolute: 0 10*3/uL (ref 0.0–0.2)
Basos: 1 %
EOS (ABSOLUTE): 0.1 10*3/uL (ref 0.0–0.4)
Eos: 1 %
Hematocrit: 33.6 % — ABNORMAL LOW (ref 34.0–46.6)
Hemoglobin: 10.8 g/dL — ABNORMAL LOW (ref 11.1–15.9)
Immature Grans (Abs): 0 10*3/uL (ref 0.0–0.1)
Immature Granulocytes: 0 %
Lymphocytes Absolute: 2.3 10*3/uL (ref 0.7–3.1)
Lymphs: 36 %
MCH: 30.2 pg (ref 26.6–33.0)
MCHC: 32.1 g/dL (ref 31.5–35.7)
MCV: 94 fL (ref 79–97)
Monocytes Absolute: 0.5 10*3/uL (ref 0.1–0.9)
Monocytes: 8 %
Neutrophils Absolute: 3.6 10*3/uL (ref 1.4–7.0)
Neutrophils: 54 %
Platelets: 278 10*3/uL (ref 150–450)
RBC: 3.58 x10E6/uL — ABNORMAL LOW (ref 3.77–5.28)
RDW: 13.6 % (ref 11.7–15.4)
WBC: 6.5 10*3/uL (ref 3.4–10.8)

## 2020-08-07 ENCOUNTER — Ambulatory Visit (INDEPENDENT_AMBULATORY_CARE_PROVIDER_SITE_OTHER): Payer: Medicare Other | Admitting: *Deleted

## 2020-08-07 DIAGNOSIS — Z0000 Encounter for general adult medical examination without abnormal findings: Secondary | ICD-10-CM

## 2020-08-07 NOTE — Progress Notes (Signed)
MEDICARE ANNUAL WELLNESS VISIT  08/07/2020  Telephone Visit Disclaimer This Medicare AWV was conducted by telephone due to national recommendations for restrictions regarding the COVID-19 Pandemic (e.g. social distancing).  I verified, using two identifiers, that I am speaking with Shelly [name] or their authorized healthcare agent. I discussed the limitations, risks, security, and privacy concerns of performing an evaluation and management service by telephone and the potential availability of an in-person appointment in the future. The patient expressed understanding and agreed to proceed.  Location of Patient: home   Location of Provider (nurse): office  Subjective:    Shelly [name] is a 80 y.o. female patient of [name], [name], MD who had a Medicare Annual Wellness Visit today via telephone. Shelly [name] is Retired and lives with their family. she has 6 children. she reports that she is not socially active and does interact with friends/family regularly. she is not physically active and enjoys sitting on porch and having visitors.  Patient Care Team: [name], [name], MD as PCP - General (Family Medicine) Danie Binder, MD (Inactive) as Consulting Physician (Gastroenterology) Ilean China, RN as Case Manager Shea Evans Norva Riffle, LCSW as Social Worker (Licensed Clinical Social Worker)  Advanced Directives 08/07/2020 08/03/2019 08/02/2018 11/18/2016 10/02/2016 04/04/2016 04/03/2016  Does Patient Have a Medical Advance Directive? Yes Yes Yes Yes No Yes No;Yes  Type of Paramedic of [place];Living will [place];Living will Living will;Healthcare Power of Attorney Out of facility DNR (pink MOST or yellow form) Out of facility DNR (pink MOST or yellow form) [place];Living will [place];Living will  Does patient want to make changes to medical advance directive? No - Patient declined No - Patient  declined - No - Patient declined - No - Patient declined -  Copy of [place] in Chart? No - copy requested Yes - validated most recent copy scanned in chart (See row information) No - copy requested - - No - copy requested No - copy requested  Would patient like information on creating a medical advance directive? - - - - Yes (MAU/Ambulatory/Procedural Areas - Information given) - -  Pre-existing out of facility DNR order (yellow form or pink MOST form) - - - Yellow form placed in chart (order not valid for inpatient use) Yellow form placed in chart (order not valid for inpatient use) - [facility] Utilization Over the Past 12 Months: # of hospitalizations or ER visits: 0 # of surgeries: 0  Review of Systems    Patient reports that her overall health is worse compared to last year.  History obtained from chart review and the patient  Patient Reported Readings (BP, Pulse, CBG, Weight, etc) none  Pain Assessment Pain : 0-10 Pain Score: 7  Pain Type: Neuropathic pain (shingles) Pain Location: Shoulder Pain Orientation: Right Pain Descriptors / Indicators: Burning,Pins and needles Pain Onset: Other (comment) Pain Frequency: Constant Pain Relieving Factors: lyrica some help Effect of Pain on Daily Activities: moderate  Pain Relieving Factors: lyrica some help  Current Medications & Allergies (verified) Allergies as of 08/07/2020      Reactions   Nsaids Other (See Comments)   AVOID all NSAID due to history of GI bleeding   Atorvastatin Other (See Comments)   Joint & muscle pain      Medication List       Accurate as of August 07, 2020 10:25 AM. If you have any  questions, ask your nurse or doctor.        amLODipine 5 MG tablet Commonly known as: NORVASC Take 0.5 tablets (2.5 mg total) by mouth daily.   cetirizine 10 MG tablet Commonly known as: ZYRTEC Take 10 mg by mouth daily.   chlorthalidone 25 MG tablet Commonly known as: HYGROTON Take 1  tablet (25 mg total) by mouth daily.   clopidogrel 75 MG tablet Commonly known as: PLAVIX TAKE ONE TABLET BY MOUTH DAILY   clotrimazole-betamethasone cream Commonly known as: LOTRISONE APPLY AS DIRECTED TWICE DAILY.   diclofenac Sodium 1 % Gel Commonly known as: VOLTAREN Apply 2 g topically 4 (four) times daily.   furosemide 40 MG tablet Commonly known as: Lasix Take 1 tablet (40 mg total) by mouth as needed for edema (weight gain).   hydrocortisone 2.5 % rectal cream Commonly known as: ANUSOL-HC Place 1 application rectally 2 (two) times daily. As needed for rectal discomfort.   lisinopril 40 MG tablet Commonly known as: ZESTRIL Take 1 tablet (40 mg total) by mouth daily.   metoprolol succinate 50 MG 24 hr tablet Commonly known as: Toprol XL Take 1 tablet (50 mg total) by mouth daily. Take with or immediately following a meal.   multivitamin tablet Take 1 tablet by mouth daily.   pantoprazole 40 MG tablet Commonly known as: PROTONIX Take 1 tablet (40 mg total) by mouth daily. 30 minutes before breakfast What changed:   when to take this  reasons to take this   pregabalin 100 MG capsule Commonly known as: Lyrica Take 1 capsule (100 mg total) by mouth at bedtime as needed. Patient not sure of strength   rosuvastatin 5 MG tablet Commonly known as: CRESTOR TAKE ONE TABLET BY MOUTH DAILY.       History (reviewed): Past Medical History:  Diagnosis Date  . Allergy   . Anemia    GI bleed  . Anginal pain ([place])   . Anxiety   . Arthritis   . Blood transfusion without reported diagnosis   . Cataract   . Chronic kidney disease   . Coronary artery disease    SEVERE  . GERD (gastroesophageal reflux disease)   . GI bleed 12/2013  . Goiter   . Hyperlipidemia   . Hypertension   . Myocardial infarct ([place])   . Myocardial infarction ([place]) 08/2013  . Neuralgia of right lower extremity 07/13/2017  . Pancolonic diverticulosis 12/2013  . Post herpetic neuralgia     Past Surgical History:  Procedure Laterality Date  . CARPAL TUNNEL RELEASE Bilateral   . COLONOSCOPY N/A 01/13/2014   Dr. Hung:pandiverticulosis   . COLONOSCOPY N/A 12/14/2015   Dr. Michail Sermon: pancolonic diverticulosis, internal hemorrhoids   . CORNEAL TRANSPLANT    . CORONARY ANGIOPLASTY  08/2013  . ESOPHAGOGASTRODUODENOSCOPY N/A 12/14/2015   Dr. Michail Sermon: normal   . GIVENS CAPSULE STUDY N/A 08/13/2016   Dr. Oneida Alar: enteritis due to aspirin.   Marland Kitchen HEEL SPUR EXCISION    . LEFT HEART CATHETERIZATION WITH CORONARY ANGIOGRAM N/A 08/23/2013   Procedure: LEFT HEART CATHETERIZATION WITH CORONARY ANGIOGRAM;  Surgeon: Peter M Martinique, MD;  Location: [facility] CATH LAB;  Service: Cardiovascular;  Laterality: N/A;  . PERCUTANEOUS CORONARY STENT INTERVENTION (PCI-S) N/A 08/24/2013   Procedure: PERCUTANEOUS CORONARY STENT INTERVENTION (PCI-S);  Surgeon: Peter M Martinique, MD;  Location: [facility] CATH LAB;  Service: Cardiovascular;  Laterality: N/A;  . TONSILLECTOMY    . TUBAL LIGATION     Family History  Problem Relation Age of Onset  .  Hypertension Brother   . Transient ischemic attack Brother   . Cancer Mother        unsure of type   . Other Father        grangrene   . Asthma Sister   . Stroke Sister   . COPD Sister   . Stroke Sister   . Early death Brother   . Early death Brother   . Liver cancer Daughter   . Cancer Daughter   . Hernia Son        Umbilical  . GI problems Son   . Pancreatic disease Daughter        pancreatectomy  . Cancer Daughter   . Diverticulitis Daughter   . Arthritis Daughter        knee   . Arthritis Daughter        shoulder   . Anemia Daughter   . GER disease Son   . Colon cancer Neg Hx   . Colon polyps Neg Hx    Social History   Socioeconomic History  . Marital status: Divorced    Spouse name: Not on file  . Number of children: 8  . Years of education: 24  . Highest education level: 11th grade  Occupational History  . 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 Smoking status: Never Smoker  . Smokeless tobacco: Never Used  Vaping Use  . Vaping Use: Never used  Substance and Sexual Activity  . Alcohol use: No    Alcohol/week: 0.0 standard drinks  . Drug use: No  . Sexual activity: Not Currently    Birth control/protection: Surgical    Comment: divorced, lives with daughter and granddaughters  Other Topics Concern  . 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   No daily caffeine use.   Social Determinants of Health   Financial Resource Strain: Low Risk   . Difficulty of Paying Living Expenses: Not very hard  Food Insecurity: No Food Insecurity  . Worried About Charity fundraiser in the Last Year: Never true  . Ran Out of Food in the Last Year: Never true  Transportation Needs: No Transportation Needs  . Lack of Transportation (Medical): No  . Lack of Transportation (Non-Medical): No  Physical Activity: Inactive  . Days of Exercise per Week: 0 days  . Minutes of Exercise per Session: 0 min  Stress: Stress Concern Present  . Feeling of Stress : To some extent  Social Connections: Socially Isolated  . Frequency of Communication with Friends and Family: More than three times a week  . Frequency of Social Gatherings with Friends and Family: More than three times a week  . Attends Religious Services: Never  . Active Member of Clubs or Organizations: No  . Attends Archivist Meetings: Never  . Marital Status: Widowed    Activities of Daily Living In your present state of health, do you have any difficulty performing the following activities: 08/07/2020  Hearing? N  Vision? N  Difficulty concentrating or making decisions? N  Walking or climbing stairs? Y  Comment r knee pain  Dressing or bathing? N  Doing errands, shopping? Y  Comment doesn't Physiological scientist  and eating ? N  Using the Toilet? N  In the past  six months, have you accidently leaked urine? N  Do you have problems with loss of bowel control? N  Managing your Medications? N  Managing your Finances? N  Housekeeping or managing your Housekeeping? Y  Comment grandchildren clean  Some recent data might be hidden    Patient Education/ Literacy How often do you need to have someone help you when you read instructions, pamphlets, or other written materials from your doctor or pharmacy?: 1 - Never What is the last grade level you completed in school?: 11  Exercise Current Exercise Habits: The patient does not participate in regular exercise at present, Exercise limited by: orthopedic condition(s);neurologic condition(s)  Diet Patient reports consuming 2 meals a day and 1 snack(s) a day Patient reports that her primary diet is: Regular Patient reports that she does have regular access to food.   Depression Screen PHQ 2/9 Scores 08/07/2020 07/30/2020 04/30/2020 02/13/2020 01/31/2020 10/31/2019 09/28/2019  PHQ - 2 Score 0 0 0 - 0 5 5  PHQ- 9 Score - - - - - 10 10  Exception Documentation - - - Patient refusal - - -     Fall Risk Fall Risk  08/07/2020 07/30/2020 04/30/2020 02/13/2020 01/31/2020  Falls in the past year? 1 0 0 0 0  Number falls in past yr: 1 - - - -  Injury with Fall? 0 - - - -  Risk for fall due to : History of fall(s);Impaired balance/gait - - - -  Follow up - - - - -     Objective:  Shelly [name] seemed alert and oriented and she participated appropriately during our telephone visit.  Blood Pressure Weight BMI  BP Readings from Last 3 Encounters:  07/30/20 132/61  07/13/20 (!) 189/79  05/16/20 (!) 160/74   Wt Readings from Last 3 Encounters:  07/30/20 250 lb (113.4 kg)  07/13/20 251 lb 6.4 oz (114 kg)  05/16/20 254 lb 6.4 oz (115.4 kg)   BMI Readings from Last 1 Encounters:  07/30/20 42.91 kg/m    *Unable to obtain current vital signs, weight, and BMI due  to telephone visit type  Hearing/Vision  . Aubren did not seem to have difficulty with hearing/understanding during the telephone conversation . Reports that she has not had a formal eye exam by an eye care professional within the past year . Reports that she has not had a formal hearing evaluation within the past year *Unable to fully assess hearing and vision during telephone visit type  Cognitive Function: 6CIT Screen 08/07/2020 08/03/2019  What Year? 0 points 0 points  What month? 0 points 0 points  What time? 0 points 0 points  Count back from 20 0 points 0 points  Months in reverse 0 points 4 points  Repeat phrase 0 points 0 points  Total Score 0 4   (Normal:0-7, Significant for Dysfunction: >8)  Normal Cognitive Function Screening: Yes   Immunization & Health Maintenance Record Immunization History  Administered Date(s) Administered  . Pneumococcal Conjugate-13 10/02/2016  . Pneumococcal Polysaccharide-23 08/25/2013  . Zoster Recombinat (Shingrix) 03/17/2018    Health Maintenance  Topic Date Due  . INFLUENZA VACCINE  08/16/2020 (Originally 12/18/2019)  . COVID-19 Vaccine (1) 04/29/2021 (Originally 06/14/1945)  . TETANUS/TDAP  10/03/2026 (Originally 06/14/1959)  . DEXA SCAN  Completed  . PNA vac Low Risk Adult  Completed  . HPV VACCINES  Aged Out  . Hepatitis C Screening  Discontinued       Assessment  This is a routine wellness  examination for Shelly [name].  Health Maintenance: Due or Overdue There are no preventive care reminders to display for this patient.  Shelly [name] does not need a referral for Community Assistance: Care Management:   no Social Work:    no Prescription Assistance:  no Nutrition/Diabetes Education:  no   Plan:  Personalized Goals Goals Addressed            This Visit's Progress   . Prevent falls        Personalized Health Maintenance & Screening Recommendations  2nd shingles vaccine  Lung Cancer Screening  Recommended: no (Low Dose CT Chest recommended if Age 73-80 years, 30 pack-year currently smoking OR have quit w/in past 15 years) Hepatitis C Screening recommended: no HIV Screening recommended: no  Advanced Directives: Written information was not prepared per patient's request.  Referrals & Orders No orders of the defined types were placed in this encounter.   Follow-up Plan . Follow-up with [name], [name], MD as planned on 11/01/20. . Pt has not had the 2nd shingles vaccine . Pt is currently dealing with post neuralgia of right shoulder. Pt is on Lyrica for this. . Pt is mostly independent of ADL's in the home, she has granddaughters who clean for her and she needs transportation because she has never driven. . Pt has POA and living will, copies requested for our records.  . Pt wears reading glasses only and denies difficulty with hearing. . AVS printed and mailed to pt.    I have personally reviewed and noted the following in the patient's chart:   . Medical and social history . Use of alcohol, tobacco or illicit drugs  . Current medications and supplements . Functional ability and status . Nutritional status . Physical activity . Advanced directives . List of other physicians . Hospitalizations, surgeries, and ER visits in previous 12 months . Vitals . Screenings to include cognitive, depression, and falls . Referrals and appointments  In addition, I have reviewed and discussed with Shelly [name] certain preventive protocols, quality metrics, and best practice recommendations. A written personalized care plan for preventive services as well as general preventive health recommendations is available and can be mailed to the patient at her request.      Rana Snare, LPN  [id number]

## 2020-08-08 ENCOUNTER — Ambulatory Visit (INDEPENDENT_AMBULATORY_CARE_PROVIDER_SITE_OTHER): Payer: Medicare Other | Admitting: Licensed Clinical Social Worker

## 2020-08-08 DIAGNOSIS — I2510 Atherosclerotic heart disease of native coronary artery without angina pectoris: Secondary | ICD-10-CM

## 2020-08-08 DIAGNOSIS — I10 Essential (primary) hypertension: Secondary | ICD-10-CM | POA: Diagnosis not present

## 2020-08-08 DIAGNOSIS — M1712 Unilateral primary osteoarthritis, left knee: Secondary | ICD-10-CM

## 2020-08-08 DIAGNOSIS — D508 Other iron deficiency anemias: Secondary | ICD-10-CM | POA: Diagnosis not present

## 2020-08-08 DIAGNOSIS — I252 Old myocardial infarction: Secondary | ICD-10-CM

## 2020-08-08 DIAGNOSIS — N1832 Chronic kidney disease, stage 3b: Secondary | ICD-10-CM

## 2020-08-08 DIAGNOSIS — E782 Mixed hyperlipidemia: Secondary | ICD-10-CM

## 2020-08-08 DIAGNOSIS — F419 Anxiety disorder, unspecified: Secondary | ICD-10-CM

## 2020-08-08 DIAGNOSIS — K219 Gastro-esophageal reflux disease without esophagitis: Secondary | ICD-10-CM

## 2020-08-08 NOTE — Patient Instructions (Signed)
Visit Information  PATIENT GOALS: Goals Addressed              This Visit's Progress   .  Manage Grief issues faced; manage depression issues faced (pt-stated)        Timeframe:  Short-Term Goal Priority:  Medium Start Date:  08/08/20                           Expected End Date: 11/08/20                       Follow Up Date  09/07/20    Manage My Emotions (Patient) Manage Grief issues faced    Why is this important?    When you are stressed, down or upset, your body reacts too.   For example, your blood pressure may get higher; you may have a headache or stomachache.   When your emotions get the best of you, your body's ability to fight off cold and flu gets weak.   These steps will help you manage your emotions.      Patient Self Care Activities:  . Eats meals independently . Takes medications as prescribed . Attends scheduled medical appointments  Patient Coping Strengths:  . Completes ADLs . Completes IADLS . Makes transport arrangements as needed . Attends medical appointments . Communicates with LCSW or RNCM as needed for CCM support  Patient Self Care Deficits:  Marland Kitchen Grief issues . Depression issues  Patient Goals:  - spend time or talk with others every day - practice relaxation or meditation daily - keep a calendar with appointment dates Talk with RNCM or LCSW as needed for support  Follow Up Plan: LCSW to call client on 09/07/2020 to assess client needs       Norva Riffle.Ogden Handlin MSW, LCSW Licensed Clinical Social Worker [facility] Care Management [phone]

## 2020-08-08 NOTE — Chronic Care Management (AMB) (Signed)
Chronic Care Management    Clinical Social Work Note  08/08/2020 Name: Shelly [name] MRN: [id number] DOB: 15-Jun-1940  Shelly [name] is a 80 y.o. year old female who is a primary care patient of [name], [name], MD. The CCM team was consulted to assist the patient with chronic disease management and/or care coordination needs related to: [employer] .   Engaged with patient by telephone for follow up visit in response to provider referral for social work chronic care management and care coordination services.   Consent to Services:  The patient was given information about Chronic Care Management services, agreed to services, and gave verbal consent prior to initiation of services.  Please see initial visit note for detailed documentation.   Patient agreed to services and consent obtained.   Assessment: Review of patient past medical history, allergies, medications, and health status, including review of relevant consultants reports was performed today as part of a comprehensive evaluation and provision of chronic care management and care coordination services.     SDOH (Social Determinants of Health) assessments and interventions performed:    Advanced Directives Status: See Vynca application for related entries.  CCM Care Plan  Allergies  Allergen Reactions  . Nsaids Other (See Comments)    AVOID all NSAID due to history of GI bleeding  . Atorvastatin Other (See Comments)    Joint & muscle pain    Outpatient Encounter Medications as of 08/08/2020  Medication Sig  . amLODipine (NORVASC) 5 MG tablet Take 0.5 tablets (2.5 mg total) by mouth daily.  . cetirizine (ZYRTEC) 10 MG tablet Take 10 mg by mouth daily.  . chlorthalidone (HYGROTON) 25 MG tablet Take 1 tablet (25 mg total) by mouth daily.  . clopidogrel (PLAVIX) 75 MG tablet TAKE ONE TABLET BY MOUTH DAILY  . clotrimazole-betamethasone (LOTRISONE) cream APPLY AS DIRECTED TWICE DAILY.  Marland Kitchen diclofenac Sodium  (VOLTAREN) 1 % GEL Apply 2 g topically 4 (four) times daily.  . furosemide (LASIX) 40 MG tablet Take 1 tablet (40 mg total) by mouth as needed for edema (weight gain).  . hydrocortisone (ANUSOL-HC) 2.5 % rectal cream Place 1 application rectally 2 (two) times daily. As needed for rectal discomfort.  Marland Kitchen lisinopril (ZESTRIL) 40 MG tablet Take 1 tablet (40 mg total) by mouth daily.  . metoprolol succinate (TOPROL XL) 50 MG 24 hr tablet Take 1 tablet (50 mg total) by mouth daily. Take with or immediately following a meal.  . Multiple Vitamin (MULTIVITAMIN) tablet Take 1 tablet by mouth daily.  . pantoprazole (PROTONIX) 40 MG tablet Take 1 tablet (40 mg total) by mouth daily. 30 minutes before breakfast (Patient taking differently: Take 40 mg by mouth as needed. 30 minutes before breakfast)  . pregabalin (LYRICA) 100 MG capsule Take 1 capsule (100 mg total) by mouth at bedtime as needed. Patient not sure of strength  . rosuvastatin (CRESTOR) 5 MG tablet TAKE ONE TABLET BY MOUTH DAILY.   No facility-administered encounter medications on file as of 08/08/2020.    Patient Active Problem List   Diagnosis Date Noted  . Subclinical hyperthyroidism 06/06/2019  . Normocytic anemia 06/02/2019  . Postherpetic neuralgia 09/28/2018  . Herpes zoster without complication [id number]  . Iron deficiency anemia 08/27/2016  . Morbid obesity ([place]) 08/27/2016  . CKD (chronic kidney disease), stage III ([place]) 04/04/2016  . Osteoarthritis left knee 04/04/2016  . Third degree heart block ([place]) 12/10/2015  . Pancolonic diverticulosis 12/09/2015  . CAD (coronary artery disease) 01/11/2014  .  History of non-ST elevation myocardial infarction (NSTEMI) 08/23/2013  . IDA (iron deficiency anemia) 08/23/2013  . Essential hypertension 08/22/2013  . GERD (gastroesophageal reflux disease) 08/22/2013  . Anxiety 08/22/2013    Conditions to be addressed/monitored: ; Monitor grief management for client  Care Plan : LCSW Care  Plan  Updates made by Katha Cabal, LCSW since 08/08/2020 12:00 AM    Problem: Depression Identification (Depression)     Goal: Depressive Symptoms Identified   Start Date: 08/08/2020  Expected End Date: 11/08/2020  This Visit's Progress: On track  Priority: Medium  Note:   Current Barriers:  . Chronic Mental Health needs related to anxiey issues and grief issues . Suicidal Ideation/Homicidal Ideation: No . Depression issues  Clinical Social Work Goal(s):  . patient will work with SW monthly by telephone or in person to reduce or manage symptoms related to anxiety and grief issues of client . patient will attend all scheduled medical appointments as evidenced by patient report and care team review of appointment completion in EMR:   . Manage depression and grief issues faced  Interventions:  1:1 collaboration with Dr. [name] [name] regarding development and update of comprehensive plan of care as evidenced by provider attestation and co-signature  . Provided patient with information about managing depressive issues faced . Talked with client about [facility], [place] and bereavement/grief  support through that agency . Talked with client about self care, (getting adequate rest, eating meals as scheduled, engaging in social activities of choice) . Talked with client about pain issues . Talked with client about ambulation of client  . Talked with client  about upcoming medical appointments . Talked with client about energy level of client . Talked with client about ADLs completion of client . Talked with client about family support (has daughter who is supportive and has two granddaughters that are supportive) . Encouraged client to call RNCM as needed for nursing support  Patient Self Care Activities:  . Eats meals independently . Takes medications as prescribed . Attends scheduled medical appointments  Patient Coping Strengths:  . Completes  ADLs . Completes IADLS . Makes transport arrangements as needed . Attends medical appointments . Communicates with LCSW or RNCM as needed for CCM support  Patient Self Care Deficits:  Marland Kitchen Grief issues . Depression issues  Patient Goals:  - spend time or talk with others every day - practice relaxation or meditation daily - keep a calendar with appointment dates Talk with RNCM or LCSW as needed for support  Follow Up Plan: LCSW to call client on 09/07/2020 to assess client needs     Norva Riffle.Gaylin Bulthuis MSW, LCSW Licensed Clinical Social Worker [facility] Care Management [phone]

## 2020-08-14 ENCOUNTER — Ambulatory Visit: Payer: Medicare Other | Admitting: Nurse Practitioner

## 2020-08-21 ENCOUNTER — Ambulatory Visit: Payer: Medicare Other | Admitting: Nurse Practitioner

## 2020-08-21 ENCOUNTER — Other Ambulatory Visit: Payer: Self-pay

## 2020-08-21 ENCOUNTER — Encounter: Payer: Self-pay | Admitting: Nurse Practitioner

## 2020-08-21 VITALS — BP 144/74 | HR 76 | Ht 64.0 in | Wt 249.8 lb

## 2020-08-21 DIAGNOSIS — E0590 Thyrotoxicosis, unspecified without thyrotoxic crisis or storm: Secondary | ICD-10-CM | POA: Diagnosis not present

## 2020-08-21 NOTE — Progress Notes (Signed)
08/21/2020, 2:14 PM                                     Endocrinology follow-up note    Subjective:    Patient ID: Shelly [name], female    DOB: June 01, 1940, PCP [name], [name], MD   Past Medical History:  Diagnosis Date  . Allergy   . Anemia    GI bleed  . Anginal pain ([place])   . Anxiety   . Arthritis   . Blood transfusion without reported diagnosis   . Cataract   . Chronic kidney disease   . Coronary artery disease    SEVERE  . GERD (gastroesophageal reflux disease)   . GI bleed 12/2013  . Goiter   . Hyperlipidemia   . Hypertension   . Myocardial infarct ([place])   . Myocardial infarction ([place]) 08/2013  . Neuralgia of right lower extremity 07/13/2017  . Pancolonic diverticulosis 12/2013  . Post herpetic neuralgia    Past Surgical History:  Procedure Laterality Date  . CARPAL TUNNEL RELEASE Bilateral   . COLONOSCOPY N/A 01/13/2014   Dr. Hung:pandiverticulosis   . COLONOSCOPY N/A 12/14/2015   Dr. Michail Sermon: pancolonic diverticulosis, internal hemorrhoids   . CORNEAL TRANSPLANT    . CORONARY ANGIOPLASTY  08/2013  . ESOPHAGOGASTRODUODENOSCOPY N/A 12/14/2015   Dr. Michail Sermon: normal   . GIVENS CAPSULE STUDY N/A 08/13/2016   Dr. Oneida Alar: enteritis due to aspirin.   Marland Kitchen HEEL SPUR EXCISION    . LEFT HEART CATHETERIZATION WITH CORONARY ANGIOGRAM N/A 08/23/2013   Procedure: LEFT HEART CATHETERIZATION WITH CORONARY ANGIOGRAM;  Surgeon: Peter M Martinique, MD;  Location: [facility] CATH LAB;  Service: Cardiovascular;  Laterality: N/A;  . PERCUTANEOUS CORONARY STENT INTERVENTION (PCI-S) N/A 08/24/2013   Procedure: PERCUTANEOUS CORONARY STENT INTERVENTION (PCI-S);  Surgeon: Peter M Martinique, MD;  Location: [facility] CATH LAB;  Service: Cardiovascular;  Laterality: N/A;  . TONSILLECTOMY    . TUBAL LIGATION     Social History   Socioeconomic History  . Marital status: Divorced    Spouse name: Not on file  . Number of children: 8  . Years of education:  6  . Highest education level: 11th grade  Occupational History  . 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 Smoking status: Never Smoker  . Smokeless tobacco: Never Used  Vaping Use  . Vaping Use: Never used  Substance and Sexual Activity  . Alcohol use: No    Alcohol/week: 0.0 standard drinks  . Drug use: No  . Sexual activity: Not Currently    Birth control/protection: Surgical    Comment: divorced, lives with daughter and granddaughters  Other Topics Concern  . 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   No daily caffeine use.   Social Determinants of Health   Financial Resource Strain: Low Risk   . Difficulty of Paying Living Expenses: Not very hard  Food Insecurity: No Food Insecurity  . Worried About  Running Out of Food in the Last Year: Never true  . Ran Out of Food in the Last Year: Never true  Transportation Needs: No Transportation Needs  . Lack of Transportation (Medical): No  . Lack of Transportation (Non-Medical): No  Physical Activity: Inactive  . Days of Exercise per Week: 0 days  . Minutes of Exercise per Session: 0 min  Stress: Stress Concern Present  . Feeling of Stress : To some extent  Social Connections: Socially Isolated  . Frequency of Communication with Friends and Family: More than three times a week  . Frequency of Social Gatherings with Friends and Family: More than three times a week  . Attends Religious Services: Never  . Active Member of Clubs or Organizations: No  . Attends Archivist Meetings: Never  . Marital Status: Widowed   Family History  Problem Relation Age of Onset  . Hypertension Brother   . Transient ischemic attack Brother   . Cancer Mother        unsure of type   . Other Father        grangrene   . Asthma Sister   . Stroke Sister   . COPD Sister   .  Stroke Sister   . Early death Brother   . Early death Brother   . Liver cancer Daughter   . Cancer Daughter   . Hernia Son        Umbilical  . GI problems Son   . Pancreatic disease Daughter        pancreatectomy  . Cancer Daughter   . Diverticulitis Daughter   . Arthritis Daughter        knee   . Arthritis Daughter        shoulder   . Anemia Daughter   . GER disease Son   . Colon cancer Neg Hx   . Colon polyps Neg Hx    Outpatient Encounter Medications as of 08/21/2020  Medication Sig  . amLODipine (NORVASC) 5 MG tablet Take 0.5 tablets (2.5 mg total) by mouth daily.  . cetirizine (ZYRTEC) 10 MG tablet Take 10 mg by mouth daily.  . chlorthalidone (HYGROTON) 25 MG tablet Take 1 tablet (25 mg total) by mouth daily.  . clopidogrel (PLAVIX) 75 MG tablet TAKE ONE TABLET BY MOUTH DAILY  . clotrimazole-betamethasone (LOTRISONE) cream APPLY AS DIRECTED TWICE DAILY.  Marland Kitchen diclofenac Sodium (VOLTAREN) 1 % GEL Apply 2 g topically 4 (four) times daily.  . hydrocortisone (ANUSOL-HC) 2.5 % rectal cream Place 1 application rectally 2 (two) times daily. As needed for rectal discomfort.  Marland Kitchen lisinopril (ZESTRIL) 40 MG tablet Take 1 tablet (40 mg total) by mouth daily.  . metoprolol succinate (TOPROL XL) 50 MG 24 hr tablet Take 1 tablet (50 mg total) by mouth daily. Take with or immediately following a meal.  . Multiple Vitamin (MULTIVITAMIN) tablet Take 1 tablet by mouth daily.  . pantoprazole (PROTONIX) 40 MG tablet Take 1 tablet (40 mg total) by mouth daily. 30 minutes before breakfast (Patient taking differently: Take 40 mg by mouth as needed. 30 minutes before breakfast)  . pregabalin (LYRICA) 100 MG capsule Take 1 capsule (100 mg total) by mouth at bedtime as needed. Patient not sure of strength  . rosuvastatin (CRESTOR) 5 MG tablet TAKE ONE TABLET BY MOUTH DAILY.  . furosemide (LASIX) 40 MG tablet Take 1 tablet (40 mg total) by mouth as needed for edema (weight gain).   No facility-administered  encounter medications on file  as of 08/21/2020.   ALLERGIES: Allergies  Allergen Reactions  . Nsaids Other (See Comments)    AVOID all NSAID due to history of GI bleeding  . Atorvastatin Other (See Comments)    Joint & muscle pain    VACCINATION STATUS: Immunization History  Administered Date(s) Administered  . Pneumococcal Conjugate-13 10/02/2016  . Pneumococcal Polysaccharide-23 08/25/2013  . Zoster Recombinat (Shingrix) 03/17/2018    Thyroid Problem Presents for follow-up visit. Symptoms include fatigue. Patient reports no anxiety, cold intolerance, constipation, depressed mood, heat intolerance, palpitations, tremors, weight gain or weight loss. The symptoms have been stable.   LIBBEY STICHT is 80 y.o. female who is seen in follow-up after she was seen in consultation for subclinical hyperthyroidism.    PMD: [name], [name], MD.  Due to septic symptoms of thyrotoxicosis, she was started on low-dose methimazole, which was since tapered down.  She has been off the Methimazole for some time now with stable thyroid response.  She denies any history of goiter, no family history of thyroid dysfunction or thyroid malignancy.  Her thyroid uptake and scan on June 14, 2019 showed 24-hour uptake of 10.3% which is normal.  There was asymmetric thyroid activity with generally increased uptake in the right lobe.  No focal hot or cold nodules identified.  Her medical history includes hypertension and hyperlipidemia on treatment.  Review of systems  Constitutional: + Minimally fluctuating body weight,  current Body mass index is 42.88 kg/m. , +fatigue, no subjective hyperthermia, no subjective hypothermia Eyes: no blurry vision, no xerophthalmia ENT: no sore throat, no nodules palpated in throat, no dysphagia/odynophagia, no hoarseness Cardiovascular: no chest pain, no shortness of breath, no palpitations, no leg swelling Respiratory: no cough, no shortness of  breath Gastrointestinal: no nausea/vomiting/diarrhea Musculoskeletal: no muscle/joint aches Skin: + rash (has shingles to right chest and shoulder- on Lyrica for nerve pain), no hyperemia Neurological: no tremors, no numbness, no tingling, no dizziness Psychiatric: no depression, no anxiety  Objective:    BP (!) 144/74 (BP Location: Right Arm, Patient Position: Sitting)   Pulse 76   Ht '5\' 4"'$  (1.626 m)   Wt 249 lb 12.8 oz (113.3 kg)   BMI 42.88 kg/m   Wt Readings from Last 3 Encounters:  08/21/20 249 lb 12.8 oz (113.3 kg)  07/30/20 250 lb (113.4 kg)  07/13/20 251 lb 6.4 oz (114 kg)    BP Readings from Last 3 Encounters:  08/21/20 (!) 144/74  07/30/20 132/61  07/13/20 (!) 189/79    Physical Exam- Limited  Constitutional:  Body mass index is 42.88 kg/m. , not in acute distress, normal state of mind Eyes:  EOMI, no exophthalmos Neck: Supple Cardiovascular: RRR, no murmers, rubs, or gallops, no edema Respiratory: Adequate breathing efforts, no crackles, rales, rhonchi, or wheezing Musculoskeletal: no gross deformities, strength intact in all four extremities, no gross restriction of joint movements Skin:  no visible rashes, no hyperemia Neurological: no tremor with outstretched hands  CMP     Component Value Date/Time   NA 140 07/30/2020 1444   K 4.9 07/30/2020 1444   CL 108 (H) 07/30/2020 1444   CO2 18 (L) 07/30/2020 1444   GLUCOSE 102 (H) 07/30/2020 1444   GLUCOSE 92 12/21/2018 1547   BUN 39 (H) 07/30/2020 1444   CREATININE 1.73 (H) 07/30/2020 1444   CREATININE 1.31 (H) 12/21/2018 1547   CALCIUM 9.7 07/30/2020 1444   PROT 7.4 07/30/2020 1444   ALBUMIN 4.1 07/30/2020 1444   AST 14 07/30/2020 1444  ALT 9 07/30/2020 1444   ALKPHOS 92 07/30/2020 1444   BILITOT <0.2 07/30/2020 1444   GFRNONAA 29 (L) 04/30/2020 1510   GFRAA 34 (L) 04/30/2020 1510     Diabetic Labs (most recent): Lab Results  Component Value Date   HGBA1C 6.0 (H) 12/09/2015   HGBA1C 6.0 (H)  08/23/2013     Lipid Panel ( most recent) Lipid Panel     Component Value Date/Time   CHOL 186 07/30/2020 1444   TRIG 113 07/30/2020 1444   HDL 48 07/30/2020 1444   CHOLHDL 3.9 07/30/2020 1444   CHOLHDL 2.6 11/20/2016 0854   VLDL 11 11/20/2016 0854   LDLCALC 118 (H) 07/30/2020 1444   LABVLDL 20 07/30/2020 1444      Lab Results  Component Value Date   TSH 0.227 (L) 07/04/2020   TSH 0.227 (L) 05/03/2020   TSH 0.218 (L) 04/30/2020   TSH 0.384 (L) 01/31/2020   TSH 0.55 01/02/2020   TSH 0.511 10/31/2019   TSH 0.28 (L) 09/23/2019   TSH 0.13 (L) 05/30/2019   TSH 0.347 (L) 04/06/2019   TSH 0.324 (L) 12/09/2015   FREET4 1.00 07/04/2020   FREET4 0.97 05/03/2020   FREET4 1.0 01/02/2020   FREET4 0.9 09/23/2019   FREET4 0.9 05/30/2019   FREET4 1.04 12/11/2015   FREET4 1.09 10/19/2013      Assessment & Plan:   1. Subclinical hyperthyroidism -Her previsit thyroid function tests remain consistent with stable after discontinuation of Methimazole.  TSH is minimally suppressed (stable) yet Free T4 and Free T3 are normal.  There is no need for her to restart Methimazole at this time.  Will monitor TFTs in 4 months.  - she is advised to maintain close follow up with [name], [name], MD for primary care needs.       - Time spent on this patient care encounter:  30 minutes of which 50% was spent in  counseling and the rest reviewing  her current and  previous labs / studies and medications  doses and developing a plan for long term care, and documenting this care. Shelly [name]  participated in the discussions, expressed understanding, and voiced agreement with the above plans.  All questions were answered to her satisfaction. she is encouraged to contact clinic should she have any questions or concerns prior to her return visit.   Follow up plan: Return in about 4 months (around 12/21/2020) for Thyroid follow up, Previsit labs, Virtual visit ok.   Rayetta Pigg,  [facility] [address] [place], [place] [address] Phone: [phone] Fax: [phone]   08/21/2020, 2:14 PM

## 2020-08-28 ENCOUNTER — Other Ambulatory Visit: Payer: Self-pay | Admitting: *Deleted

## 2020-08-28 MED ORDER — ROSUVASTATIN CALCIUM 5 MG PO TABS
5.0000 mg | ORAL_TABLET | Freq: Every day | ORAL | 1 refills | Status: DC
Start: 2020-08-28 — End: 2020-08-29

## 2020-08-29 ENCOUNTER — Ambulatory Visit (INDEPENDENT_AMBULATORY_CARE_PROVIDER_SITE_OTHER): Payer: Medicare Other | Admitting: Cardiology

## 2020-08-29 ENCOUNTER — Encounter: Payer: Self-pay | Admitting: Cardiology

## 2020-08-29 ENCOUNTER — Other Ambulatory Visit: Payer: Self-pay

## 2020-08-29 VITALS — BP 130/60 | HR 70 | Ht 64.0 in | Wt 251.8 lb

## 2020-08-29 DIAGNOSIS — I10 Essential (primary) hypertension: Secondary | ICD-10-CM | POA: Diagnosis not present

## 2020-08-29 DIAGNOSIS — I2510 Atherosclerotic heart disease of native coronary artery without angina pectoris: Secondary | ICD-10-CM

## 2020-08-29 DIAGNOSIS — E782 Mixed hyperlipidemia: Secondary | ICD-10-CM | POA: Diagnosis not present

## 2020-08-29 MED ORDER — ROSUVASTATIN CALCIUM 10 MG PO TABS
10.0000 mg | ORAL_TABLET | Freq: Every day | ORAL | 6 refills | Status: DC
Start: 2020-08-29 — End: 2021-02-11

## 2020-08-29 NOTE — Patient Instructions (Signed)
Medication Instructions:   Increase Crestor to '10mg'$  daily.   Continue all other medications.    Labwork: none  Testing/Procedures: none  Follow-Up: 6 months   Any Other Special Instructions Will Be Listed Below (If Applicable).  If you need a refill on your cardiac medications before your next appointment, please call your pharmacy.

## 2020-08-29 NOTE — Progress Notes (Signed)
Clinical Summary Shelly [name] is a 80 y.o.female former patient of Dr Bronson Ing, this is our first visit together.  1. CAD - history of NSTEMI, DES x 2 to rCA 08/2013 - she is on plavix for secondary prevention due to bleeding on ASA per Dr Court Joy notes  - no recent chest pain. No SOB/DOE - compliant with meds   2. Hyperlipidemia [name] aches on atorvastatin, has tolerated crestor '5mg'$   07/2020 TC 186 TG 113 HDL 48 LDL 118  3. HTN - norvasc caused LE edema, though retried during 04/24/20 appt with Shelly [name] - occasional leg swelling though infrequent  4. Postherpetic neuralgia  5. CKD 3 - follwed by Dr Theador Hawthorne   6. Bilatearl LE edema - 08/2019 echo LVEF 65-70%, grade I dd, normal RV function, moderate pulm HTN PASP 40 - infrequent swelling. 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 Allergy   . Anemia    GI bleed  . Anginal pain ([place])   . Anxiety   . Arthritis   . Blood transfusion without reported diagnosis   . Cataract   . Chronic kidney disease   . Coronary artery disease    SEVERE  . GERD (gastroesophageal reflux disease)   . GI bleed 12/2013  . Goiter   . Hyperlipidemia   . Hypertension   . Myocardial infarct ([place])   . Myocardial infarction ([place]) 08/2013  . Neuralgia of right lower extremity 07/13/2017  . Pancolonic diverticulosis 12/2013  . Post herpetic neuralgia      Allergies  Allergen Reactions  . Nsaids Other (See Comments)    AVOID all NSAID due to history of GI bleeding  . Atorvastatin Other (See Comments)    Joint & muscle pain     Current Outpatient Medications  Medication Sig Dispense Refill  . amLODipine (NORVASC) 5 MG tablet Take 0.5 tablets (2.5 mg total) by mouth daily. 45 tablet 3  . cetirizine (ZYRTEC) 10 MG tablet Take 10 mg by mouth daily.    . chlorthalidone (HYGROTON) 25 MG tablet Take 1 tablet (25 mg total) by mouth  daily. 90 tablet 3  . clopidogrel (PLAVIX) 75 MG tablet TAKE ONE TABLET BY MOUTH DAILY 90 tablet 3  . clotrimazole-betamethasone (LOTRISONE) cream APPLY AS DIRECTED TWICE DAILY. 45 g 2  . diclofenac Sodium (VOLTAREN) 1 % GEL Apply 2 g topically 4 (four) times daily. 100 g 2  . furosemide (LASIX) 40 MG tablet Take 1 tablet (40 mg total) by mouth as needed for edema (weight gain). 30 tablet 6  . hydrocortisone (ANUSOL-HC) 2.5 % rectal cream Place 1 application rectally 2 (two) times daily. As needed for rectal discomfort. 30 g 1  . lisinopril (ZESTRIL) 40 MG tablet Take 1 tablet (40 mg total) by mouth daily. 90 tablet 3  . metoprolol succinate (TOPROL XL) 50 MG 24 hr tablet Take 1 tablet (50 mg total) by mouth daily. Take with or immediately following a meal. 90 tablet 2  . Multiple Vitamin (MULTIVITAMIN) tablet Take 1 tablet by mouth daily.    . pantoprazole (PROTONIX) 40 MG tablet Take 1 tablet (40 mg total) by mouth daily. 30 minutes before breakfast (Patient taking differently: Take 40 mg by mouth as needed. 30 minutes before breakfast) 90 tablet 3  . pregabalin (LYRICA) 100 MG capsule Take 1 capsule (100 mg total) by mouth at  bedtime as needed. Patient not sure of strength 30 capsule 5  . rosuvastatin (CRESTOR) 5 MG tablet Take 1 tablet (5 mg total) by mouth daily. 90 tablet 1   No current facility-administered medications for this visit.     Past Surgical History:  Procedure Laterality Date  . CARPAL TUNNEL RELEASE Bilateral   . COLONOSCOPY N/A 01/13/2014   Dr. Hung:pandiverticulosis   . COLONOSCOPY N/A 12/14/2015   Dr. Michail Sermon: pancolonic diverticulosis, internal hemorrhoids   . CORNEAL TRANSPLANT    . CORONARY ANGIOPLASTY  08/2013  . ESOPHAGOGASTRODUODENOSCOPY N/A 12/14/2015   Dr. Michail Sermon: normal   . GIVENS CAPSULE STUDY N/A 08/13/2016   Dr. Oneida Alar: enteritis due to aspirin.   Marland Kitchen HEEL SPUR EXCISION    . LEFT HEART CATHETERIZATION WITH CORONARY ANGIOGRAM N/A 08/23/2013   Procedure:  LEFT HEART CATHETERIZATION WITH CORONARY ANGIOGRAM;  Surgeon: Peter M Martinique, MD;  Location[facility] CATH LAB;  Service: Cardiovascular;  Laterality: N/A;  . PERCUTANEOUS CORONARY STENT INTERVENTION (PCI-S) N/A 08/24/2013   Procedure: PERCUTANEOUS CORONARY STENT INTERVENTION (PCI-S);  Surgeon: Peter M Martinique, MD;  Location: [facility] CATH LAB;  Service: Cardiovascular;  Laterality: N/A;  . TONSILLECTOMY    . TUBAL LIGATION       Allergies  Allergen Reactions  . Nsaids Other (See Comments)    AVOID all NSAID due to history of GI bleeding  . Atorvastatin Other (See Comments)    Joint & muscle pain      Family History  Problem Relation Age of Onset  . Hypertension Brother   . Transient ischemic attack Brother   . Cancer Mother        unsure of type   . Other Father        grangrene   . Asthma Sister   . Stroke Sister   . COPD Sister   . Stroke Sister   . Early death Brother   . Early death Brother   . Liver cancer Daughter   . Cancer Daughter   . Hernia Son        Umbilical  . GI problems Son   . Pancreatic disease Daughter        pancreatectomy  . Cancer Daughter   . Diverticulitis Daughter   . Arthritis Daughter        knee   . Arthritis Daughter        shoulder   . Anemia Daughter   . GER disease Son   . Colon cancer Neg Hx   . Colon polyps Neg Hx      Social History Shelly [name] reports that she has never smoked. She has never used smokeless tobacco. Shelly [name] reports no history of alcohol use.   Review of Systems CONSTITUTIONAL: No weight loss, fever, chills, weakness or fatigue.  HEENT: Eyes: No visual loss, blurred vision, double vision or yellow sclerae.No hearing loss, sneezing, congestion, runny nose or sore throat.  SKIN: No rash or itching.  CARDIOVASCULAR: per hpi RESPIRATORY: No shortness of breath, cough or sputum.  GASTROINTESTINAL: No anorexia, nausea, vomiting or diarrhea. No abdominal pain or blood.  GENITOURINARY: No burning on urination, no  polyuria NEUROLOGICAL: No headache, dizziness, syncope, paralysis, ataxia, numbness or tingling in the extremities. No change in bowel or bladder control.  MUSCULOSKELETAL: No muscle, back pain, joint pain or stiffness.  LYMPHATICS: No enlarged nodes. No history of splenectomy.  PSYCHIATRIC: No history of depression or anxiety.  ENDOCRINOLOGIC: No reports of sweating, cold or heat intolerance. No polyuria or  polydipsia.  Marland Kitchen   Physical Examination Today's Vitals   08/29/20 1414  BP: 130/60  Pulse: 70  SpO2: 97%  Weight: 251 lb 12.8 oz (114.2 kg)  Height: '5\' 4"'$  (1.626 m)   Body mass index is 43.22 kg/m.  Gen: resting comfortably, no acute distress HEENT: no scleral icterus, pupils equal round and reactive, no palptable cervical adenopathy,  CV: RRR, no m/r/g, no jvd Resp: Clear to auscultation bilaterally GI: abdomen is soft, non-tender, non-distended, normal bowel sounds, no hepatosplenomegaly MSK: extremities are warm, 1+ bilateral LE edema Skin: warm, no rash Neuro:  no focal deficits Psych: appropriate affect     Assessment and Plan  1. CAD - no recent symptoms, continue current med  2. Hyperlipidemia - LDL not at goal of <70, increase crestor to '10mg'$  daily  3. HTN - at goal. Prior swelling on norvasc but tolerating current dose, continue      Arnoldo Lenis, M.D.

## 2020-09-07 ENCOUNTER — Ambulatory Visit (INDEPENDENT_AMBULATORY_CARE_PROVIDER_SITE_OTHER): Payer: Medicare Other | Admitting: Licensed Clinical Social Worker

## 2020-09-07 DIAGNOSIS — M1712 Unilateral primary osteoarthritis, left knee: Secondary | ICD-10-CM

## 2020-09-07 DIAGNOSIS — F419 Anxiety disorder, unspecified: Secondary | ICD-10-CM

## 2020-09-07 DIAGNOSIS — D508 Other iron deficiency anemias: Secondary | ICD-10-CM | POA: Diagnosis not present

## 2020-09-07 DIAGNOSIS — I2510 Atherosclerotic heart disease of native coronary artery without angina pectoris: Secondary | ICD-10-CM

## 2020-09-07 DIAGNOSIS — I10 Essential (primary) hypertension: Secondary | ICD-10-CM | POA: Diagnosis not present

## 2020-09-07 DIAGNOSIS — N1832 Chronic kidney disease, stage 3b: Secondary | ICD-10-CM

## 2020-09-07 DIAGNOSIS — K219 Gastro-esophageal reflux disease without esophagitis: Secondary | ICD-10-CM

## 2020-09-07 DIAGNOSIS — E782 Mixed hyperlipidemia: Secondary | ICD-10-CM | POA: Diagnosis not present

## 2020-09-07 DIAGNOSIS — K5730 Diverticulosis of large intestine without perforation or abscess without bleeding: Secondary | ICD-10-CM

## 2020-09-07 NOTE — Chronic Care Management (AMB) (Signed)
Chronic Care Management    Clinical Social Work Note  09/07/2020 Name: Shelly [name] MRN: [id number] DOB: 10/15/1940  Shelly [name] is a 80 y.o. year old female who is a primary care patient of [name], [name], MD. The CCM team was consulted to assist the patient with chronic disease management and/or care coordination needs related to: [employer] .   Engaged with patient by telephone for follow up visit in response to provider referral for social work chronic care management and care coordination services.   Consent to Services:  The patient was given information about Chronic Care Management services, agreed to services, and gave verbal consent prior to initiation of services.  Please see initial visit note for detailed documentation.   Patient agreed to services and consent obtained.   Assessment: Review of patient past medical history, allergies, medications, and health status, including review of relevant consultants reports was performed today as part of a comprehensive evaluation and provision of chronic care management and care coordination services.     SDOH (Social Determinants of Health) assessments and interventions performed:  SDOH Interventions   Flowsheet Row Most Recent Value  SDOH Interventions   Depression Interventions/Treatment  --  [informed client of LCSW support and of RNCM support]       Advanced Directives Status: See Vynca application for related entries.  CCM Care Plan  Allergies  Allergen Reactions  . Nsaids Other (See Comments)    AVOID all NSAID due to history of GI bleeding  . Atorvastatin Other (See Comments)    Joint & muscle pain    Outpatient Encounter Medications as of 09/07/2020  Medication Sig  . amLODipine (NORVASC) 5 MG tablet Take 2.5 mg by mouth daily.  . cetirizine (ZYRTEC) 10 MG tablet Take 10 mg by mouth daily.  . chlorthalidone (HYGROTON) 25 MG tablet Take 1 tablet (25 mg total) by mouth daily.  . clopidogrel  (PLAVIX) 75 MG tablet TAKE ONE TABLET BY MOUTH DAILY  . clotrimazole-betamethasone (LOTRISONE) cream APPLY AS DIRECTED TWICE DAILY.  Marland Kitchen diclofenac Sodium (VOLTAREN) 1 % GEL Apply 2 g topically 4 (four) times daily.  . furosemide (LASIX) 40 MG tablet Take 40 mg by mouth daily as needed (weight gain).  . hydrocortisone (ANUSOL-HC) 2.5 % rectal cream Place 1 application rectally 2 (two) times daily. As needed for rectal discomfort.  Marland Kitchen lisinopril (ZESTRIL) 40 MG tablet Take 1 tablet (40 mg total) by mouth daily.  . metoprolol succinate (TOPROL XL) 50 MG 24 hr tablet Take 1 tablet (50 mg total) by mouth daily. Take with or immediately following a meal.  . Multiple Vitamin (MULTIVITAMIN) tablet Take 1 tablet by mouth daily.  . pantoprazole (PROTONIX) 40 MG tablet Take 1 tablet (40 mg total) by mouth daily. 30 minutes before breakfast (Patient taking differently: Take 40 mg by mouth as needed. 30 minutes before breakfast)  . pregabalin (LYRICA) 100 MG capsule Take 1 capsule (100 mg total) by mouth at bedtime as needed. Patient not sure of strength  . rosuvastatin (CRESTOR) 10 MG tablet Take 1 tablet (10 mg total) by mouth daily.   No facility-administered encounter medications on file as of 09/07/2020.    Patient Active Problem List   Diagnosis Date Noted  . Subclinical hyperthyroidism 06/06/2019  . Normocytic anemia 06/02/2019  . Postherpetic neuralgia 09/28/2018  . Herpes zoster without complication 123XX123  . Iron deficiency anemia 08/27/2016  . Morbid obesity ([place]) 08/27/2016  . CKD (chronic kidney disease), stage III ([place]) 04/04/2016  .  Osteoarthritis left knee 04/04/2016  . Third degree heart block ([place]) 12/10/2015  . Pancolonic diverticulosis 12/09/2015  . CAD (coronary artery disease) 01/11/2014  . History of non-ST elevation myocardial infarction (NSTEMI) 08/23/2013  . IDA (iron deficiency anemia) 08/23/2013  . Essential hypertension 08/22/2013  . GERD (gastroesophageal reflux  disease) 08/22/2013  . Anxiety 08/22/2013    Conditions to be addressed/monitored: Monitor client management of grief issues; monitor client management of depression issues   Care Plan : LCSW Care Plan  Updates made by Katha Cabal, LCSW since 09/07/2020 12:00 AM    Problem: Depression Identification (Depression)     Goal: Depressive Symptoms Identified;Manage Depression symptoms and manage grief symptoms   Start Date: 08/08/2020  Expected End Date: 11/08/2020  This Visit's Progress: On track  Recent Progress: On track  Priority: Medium  Note:   Current Barriers:  . Chronic Mental Health needs related to anxiey issues and grief issues . Suicidal Ideation/Homicidal Ideation: No . Depression issues  Clinical Social Work Goal(s):  . patient will work with SW monthly by telephone or in person to reduce or manage symptoms related to anxiety and grief issues of client . patient will attend all scheduled medical appointments as evidenced by patient report and care team review of appointment completion in EMR:   . Client will manage depression and grief issues faced within the next 30 days  Interventions:  1:1 collaboration with Dr. [name] [name] regarding development and update of comprehensive plan of care as evidenced by provider attestation and co-signature . Talked with client  about pain issues of client . Talked with client about self care, (getting adequate rest, eating meals as scheduled, engaging in social activities of choice) . Talked with client about ambulation of client (client has a walker to use as needed) . Talked with client  about upcoming medical appointments . Talked with client about energy level of client . Talked with client about ADLs completion of client . Talked with client about family support (has daughter who is supportive and has two granddaughters that are supportive) . Encouraged client to call RNCM as needed for nursing support  Patient Self  Care Activities:  . Eats meals independently . Takes medications as prescribed . Attends scheduled medical appointments  Patient Coping Strengths:  . Completes ADLs . Completes IADLS . Makes transport arrangements as needed . Attends medical appointments . Communicates with LCSW or RNCM as needed for CCM support  Patient Self Care Deficits:  Marland Kitchen Grief issues . Depression issues  Patient Goals:  - spend time or talk with others every day - practice relaxation or meditation daily - keep a calendar with appointment dates Talk with RNCM or LCSW as needed for support  Follow Up Plan: LCSW to call client on 10/08/20 to assess client needs     Norva Riffle.Dajuana Palen MSW, LCSW Licensed Clinical Social Worker [facility] Care Management [phone]

## 2020-09-07 NOTE — Patient Instructions (Signed)
Visit Information  PATIENT GOALS: Goals Addressed              This Visit's Progress   .  Manage Grief issues faced; manage depression issues faced (pt-stated)        Timeframe:  Short-Term Goal Priority:  Medium Start Date:  08/08/20                           Expected End Date: 11/08/20                       Follow Up Date  10/08/20   Manage My Emotions (Patient) Manage Grief issues faced ; Manage depression issues faced   Why is this important?    When you are stressed, down or upset, your body reacts too.   For example, your blood pressure may get higher; you may have a headache or stomachache.   When your emotions get the best of you, your body's ability to fight off cold and flu gets weak.   These steps will help you manage your emotions.     Patient Self Care Activities:  . Eats meals independently . Takes medications as prescribed . Attends scheduled medical appointments  Patient Coping Strengths:  . Completes ADLs . Completes IADLS . Makes transport arrangements as needed . Attends medical appointments . Communicates with LCSW or RNCM as needed for CCM support  Patient Self Care Deficits:  Marland Kitchen Grief issues . Depression issues  Patient Goals:  - spend time or talk with others every day - practice relaxation or meditation daily - keep a calendar with appointment dates Talk with RNCM or LCSW as needed for support  Follow Up Plan: LCSW to call client on 10/08/20 to assess client needs       Norva Riffle.Lavette Yankovich MSW, LCSW Licensed Clinical Social Worker [facility] Care Management [phone]

## 2020-10-08 ENCOUNTER — Ambulatory Visit (INDEPENDENT_AMBULATORY_CARE_PROVIDER_SITE_OTHER): Payer: Medicare Other | Admitting: Licensed Clinical Social Worker

## 2020-10-08 DIAGNOSIS — M1712 Unilateral primary osteoarthritis, left knee: Secondary | ICD-10-CM | POA: Diagnosis not present

## 2020-10-08 DIAGNOSIS — N1832 Chronic kidney disease, stage 3b: Secondary | ICD-10-CM

## 2020-10-08 DIAGNOSIS — K219 Gastro-esophageal reflux disease without esophagitis: Secondary | ICD-10-CM

## 2020-10-08 DIAGNOSIS — E782 Mixed hyperlipidemia: Secondary | ICD-10-CM | POA: Diagnosis not present

## 2020-10-08 DIAGNOSIS — D508 Other iron deficiency anemias: Secondary | ICD-10-CM | POA: Diagnosis not present

## 2020-10-08 DIAGNOSIS — I10 Essential (primary) hypertension: Secondary | ICD-10-CM | POA: Diagnosis not present

## 2020-10-08 DIAGNOSIS — F419 Anxiety disorder, unspecified: Secondary | ICD-10-CM

## 2020-10-08 DIAGNOSIS — I2510 Atherosclerotic heart disease of native coronary artery without angina pectoris: Secondary | ICD-10-CM | POA: Diagnosis not present

## 2020-10-08 NOTE — Chronic Care Management (AMB) (Signed)
Chronic Care Management    Clinical Social Work Note  10/08/2020 Name: Shelly [name] MRN: [id number] DOB: 06/16/1940  Shelly [name] is a 80 y.o. year old female who is a primary care patient of [name], Shelly Kaufmann, MD. The CCM team was consulted to assist the patient with chronic disease management and/or care coordination needs related to: [employer] .   Engaged with patient by telephone for follow up visit in response to provider referral for social work chronic care management and care coordination services.   Consent to Services:  The patient was given information about Chronic Care Management services, agreed to services, and gave verbal consent prior to initiation of services.  Please see initial visit note for detailed documentation.   Patient agreed to services and consent obtained.   Assessment: Review of patient past medical history, allergies, medications, and health status, including review of relevant consultants reports was performed today as part of a comprehensive evaluation and provision of chronic care management and care coordination services.     SDOH (Social Determinants of Health) assessments and interventions performed:  SDOH Interventions   Flowsheet Row Most Recent Value  SDOH Interventions   Depression Interventions/Treatment  --  [informed client of LCSW support and of RNCM support]       Advanced Directives Status: See Vynca application for related entries.  CCM Care Plan  Allergies  Allergen Reactions  . Nsaids Other (See Comments)    AVOID all NSAID due to history of GI bleeding  . Atorvastatin Other (See Comments)    Joint & muscle pain    Outpatient Encounter Medications as of 10/08/2020  Medication Sig  . amLODipine (NORVASC) 5 MG tablet Take 2.5 mg by mouth daily.  . cetirizine (ZYRTEC) 10 MG tablet Take 10 mg by mouth daily.  . chlorthalidone (HYGROTON) 25 MG tablet Take 1 tablet (25 mg total) by mouth daily.  . clopidogrel  (PLAVIX) 75 MG tablet TAKE ONE TABLET BY MOUTH DAILY  . clotrimazole-betamethasone (LOTRISONE) cream APPLY AS DIRECTED TWICE DAILY.  Marland Kitchen diclofenac Sodium (VOLTAREN) 1 % GEL Apply 2 g topically 4 (four) times daily.  . furosemide (LASIX) 40 MG tablet Take 40 mg by mouth daily as needed (weight gain).  . hydrocortisone (ANUSOL-HC) 2.5 % rectal cream Place 1 application rectally 2 (two) times daily. As needed for rectal discomfort.  Marland Kitchen lisinopril (ZESTRIL) 40 MG tablet Take 1 tablet (40 mg total) by mouth daily.  . metoprolol succinate (TOPROL XL) 50 MG 24 hr tablet Take 1 tablet (50 mg total) by mouth daily. Take with or immediately following a meal.  . Multiple Vitamin (MULTIVITAMIN) tablet Take 1 tablet by mouth daily.  . pantoprazole (PROTONIX) 40 MG tablet Take 1 tablet (40 mg total) by mouth daily. 30 minutes before breakfast (Patient taking differently: Take 40 mg by mouth as needed. 30 minutes before breakfast)  . pregabalin (LYRICA) 100 MG capsule Take 1 capsule (100 mg total) by mouth at bedtime as needed. Patient not sure of strength  . rosuvastatin (CRESTOR) 10 MG tablet Take 1 tablet (10 mg total) by mouth daily.   No facility-administered encounter medications on file as of 10/08/2020.    Patient Active Problem List   Diagnosis Date Noted  . Subclinical hyperthyroidism 06/06/2019  . Normocytic anemia 06/02/2019  . Postherpetic neuralgia 09/28/2018  . Herpes zoster without complication 123XX123  . Iron deficiency anemia 08/27/2016  . Morbid obesity ([place]) 08/27/2016  . CKD (chronic kidney disease), stage III ([place]) 04/04/2016  .  Osteoarthritis left knee 04/04/2016  . Third degree heart block ([place]) 12/10/2015  . Pancolonic diverticulosis 12/09/2015  . CAD (coronary artery disease) 01/11/2014  . History of non-ST elevation myocardial infarction (NSTEMI) 08/23/2013  . IDA (iron deficiency anemia) 08/23/2013  . Essential hypertension 08/22/2013  . GERD (gastroesophageal reflux  disease) 08/22/2013  . Anxiety 08/22/2013    Conditions to be addressed/monitored: Monitor client management of anxiety issues and grief issues   Care Plan : LCSW Care Plan  Updates made by Katha Cabal, LCSW since 10/08/2020 12:00 AM    Problem: Depression Identification (Depression)     Goal: Depressive Symptoms Identified;Manage Depression symptoms and manage grief symptoms   Start Date: 10/08/2020  Expected End Date: 01/08/2021  This Visit's Progress: On track  Recent Progress: On track  Priority: Medium  Note:   Current Barriers:  . Chronic Mental Health needs related to anxiey issues and grief issues . Suicidal Ideation/Homicidal Ideation: No . Depression issues  Clinical Social Work Goal(s):  . patient will work with SW monthly by telephone or in person to reduce or manage symptoms related to anxiety and grief issues of client . patient will attend all scheduled medical appointments as evidenced by patient report and care team review of appointment completion in EMR:    Interventions:  1:1 collaboration with Dr. Fransisca [name] [name] regarding development and update of comprehensive plan of care as evidenced by provider attestation and co-signature . Talked with client  about pain issues of client . Talked with client about self care, (getting adequate rest, engaging in social activities of choice) . Talked with client about ambulation of client (client has a walker to use as needed) . Talked with client  about upcoming medical appointments . Talked with client about energy level of client . Talked with client about ADLs completion of client . Talked with client about family support (has daughter who is supportive and has two granddaughters that are supportive) . Encouraged client to call RNCM as needed for nursing support . Talked with client about mood of client (client said her mood can go up and down; but sometimes it may be related to pain from Shingles) . Talked  with client about relaxation techniques (enjoys spending time with her great granddaughter, receives help regularly from her granddaughters,watches TV)  Patient Self Care Activities:  . Eats meals independently . Takes medications as prescribed . Attends scheduled medical appointments  Patient Coping Strengths:  . Completes ADLs . Completes IADLS . Makes transport arrangements as needed . Attends medical appointments . Communicates with LCSW or RNCM as needed for CCM support  Patient Self Care Deficits:  Marland Kitchen Grief issues . Depression issues  Patient Goals:  - spend time or talk with others every day - practice relaxation or meditation daily - keep a calendar with appointment dates Talk with RNCM or LCSW as needed for support  Follow Up Plan: LCSW to call client on 11/23/20 tto assess client needs      Norva Riffle.Finian Helvey MSW, LCSW Licensed Clinical Social Worker [facility] Care Management [phone]

## 2020-10-08 NOTE — Patient Instructions (Signed)
Visit Information  PATIENT GOALS: Goals Addressed              This Visit's Progress   .  Manage Grief issues faced; manage depression issues faced (pt-stated)        Timeframe:  Short-Term Goal Priority:  Medium Progress: On Track Start Date:  10/08/20                        Expected End Date: 01/08/21                       Follow Up Date  11/23/20   Manage My Emotions (Patient) Manage Grief issues faced ; Manage depression issues faced   Why is this important?    When you are stressed, down or upset, your body reacts too.   For example, your blood pressure may get higher; you may have a headache or stomachache.   When your emotions get the best of you, your body's ability to fight off cold and flu gets weak.   These steps will help you manage your emotions.     Patient Self Care Activities:  . Eats meals independently . Takes medications as prescribed . Attends scheduled medical appointments  Patient Coping Strengths:  . Completes ADLs . Completes IADLS . Makes transport arrangements as needed . Attends medical appointments . Communicates with LCSW or RNCM as needed for CCM support  Patient Self Care Deficits:  Marland Kitchen Grief issues . Depression issues  Patient Goals:  - spend time or talk with others every day - practice relaxation or meditation daily - keep a calendar with appointment dates Talk with RNCM or LCSW as needed for support  Follow Up Plan: LCSW to call client on 11/23/20 to assess client needs     .          Shelly [name].Loretto Belinsky MSW, LCSW Licensed Clinical Social Worker [facility] Care Management [phone]

## 2020-10-10 ENCOUNTER — Ambulatory Visit: Payer: Medicare Other | Admitting: Gastroenterology

## 2020-10-10 ENCOUNTER — Encounter: Payer: Self-pay | Admitting: Gastroenterology

## 2020-10-10 VITALS — BP 155/71 | HR 69 | Temp 97.1°F | Ht 64.0 in | Wt 251.8 lb

## 2020-10-10 DIAGNOSIS — K219 Gastro-esophageal reflux disease without esophagitis: Secondary | ICD-10-CM

## 2020-10-10 NOTE — Progress Notes (Signed)
Referring Provider: [name], [name], MD Primary Care Physician:  [name], [name], MD Primary GI: Dr. Gala Romney   Chief Complaint  Patient presents with  . Gastroesophageal Reflux    Doing ok    HPI:   Shelly [name] is a 80 y.o. female presenting today with a history of GERD, diverticular bleed several years ago, IDA with capsule study showing enteritis due to aspirin use, chronic GERD.Prefers frequent visits with health care team.Chronic normocytic anemia with chronic diease component.   Eats cheerios for constipation. Protonix usually every other day. No abdominal pain. No N/V. No dysphagia. No overt GI bleeding. Chronic post-herpetic neuralgia continues to be an issue for her.   Past Medical History:  Diagnosis Date  . Allergy   . Anemia    GI bleed  . Anginal pain ([place])   . Anxiety   . Arthritis   . Blood transfusion without reported diagnosis   . Cataract   . Chronic kidney disease   . Coronary artery disease    SEVERE  . GERD (gastroesophageal reflux disease)   . GI bleed 12/2013  . Goiter   . Hyperlipidemia   . Hypertension   . Myocardial infarct ([place])   . Myocardial infarction ([place]) 08/2013  . Neuralgia of right lower extremity 07/13/2017  . Pancolonic diverticulosis 12/2013  . Post herpetic neuralgia     Past Surgical History:  Procedure Laterality Date  . CARPAL TUNNEL RELEASE Bilateral   . COLONOSCOPY N/A 01/13/2014   Dr. Hung:pandiverticulosis   . COLONOSCOPY N/A 12/14/2015   Dr. Michail Sermon: pancolonic diverticulosis, internal hemorrhoids   . CORNEAL TRANSPLANT    . CORONARY ANGIOPLASTY  08/2013  . ESOPHAGOGASTRODUODENOSCOPY N/A 12/14/2015   Dr. Michail Sermon: normal   . GIVENS CAPSULE STUDY N/A 08/13/2016   Dr. Oneida Alar: enteritis due to aspirin.   Marland Kitchen HEEL SPUR EXCISION    . LEFT HEART CATHETERIZATION WITH CORONARY ANGIOGRAM N/A 08/23/2013   Procedure: LEFT HEART CATHETERIZATION WITH CORONARY ANGIOGRAM;  Surgeon: Peter M Martinique, MD;  Location: [facility] CATH  LAB;  Service: Cardiovascular;  Laterality: N/A;  . PERCUTANEOUS CORONARY STENT INTERVENTION (PCI-S) N/A 08/24/2013   Procedure: PERCUTANEOUS CORONARY STENT INTERVENTION (PCI-S);  Surgeon: Peter M Martinique, MD;  Location: [facility] CATH LAB;  Service: Cardiovascular;  Laterality: N/A;  . TONSILLECTOMY    . TUBAL LIGATION      Current Outpatient Medications  Medication Sig Dispense Refill  . amLODipine (NORVASC) 5 MG tablet Take 2.5 mg by mouth daily.    . cetirizine (ZYRTEC) 10 MG tablet Take 10 mg by mouth daily.    . chlorthalidone (HYGROTON) 25 MG tablet Take 1 tablet (25 mg total) by mouth daily. 90 tablet 3  . clopidogrel (PLAVIX) 75 MG tablet TAKE ONE TABLET BY MOUTH DAILY 90 tablet 3  . clotrimazole-betamethasone (LOTRISONE) cream APPLY AS DIRECTED TWICE DAILY. 45 g 2  . furosemide (LASIX) 40 MG tablet Take 40 mg by mouth daily as needed (weight gain).    . hydrocortisone (ANUSOL-HC) 2.5 % rectal cream Place 1 application rectally 2 (two) times daily. As needed for rectal discomfort. 30 g 1  . lisinopril (ZESTRIL) 40 MG tablet Take 1 tablet (40 mg total) by mouth daily. 90 tablet 3  . metoprolol succinate (TOPROL XL) 50 MG 24 hr tablet Take 1 tablet (50 mg total) by mouth daily. Take with or immediately following a meal. 90 tablet 2  . Multiple Vitamin (MULTIVITAMIN) tablet Take 1 tablet by mouth daily.    Marland Kitchen  pantoprazole (PROTONIX) 40 MG tablet Take 1 tablet (40 mg total) by mouth daily. 30 minutes before breakfast (Patient taking differently: Take 40 mg by mouth as needed. 30 minutes before breakfast) 90 tablet 3  . pregabalin (LYRICA) 100 MG capsule Take 1 capsule (100 mg total) by mouth at bedtime as needed. Patient not sure of strength 30 capsule 5  . rosuvastatin (CRESTOR) 10 MG tablet Take 1 tablet (10 mg total) by mouth daily. 30 tablet 6  . diclofenac Sodium (VOLTAREN) 1 % GEL Apply 2 g topically 4 (four) times daily. (Patient not taking: Reported on 10/10/2020) 100 g 2   No current  facility-administered medications for this visit.    Allergies as of 10/10/2020 - Review Complete 10/10/2020  Allergen Reaction Noted  . Nsaids Other (See Comments) 07/25/2016  . Atorvastatin Other (See Comments) 11/30/2018    Family History  Problem Relation Age of Onset  . Hypertension Brother   . Transient ischemic attack Brother   . Cancer Mother        unsure of type   . Other Father        grangrene   . Asthma Sister   . Stroke Sister   . COPD Sister   . Stroke Sister   . Early death Brother   . Early death Brother   . Liver cancer Daughter   . Cancer Daughter   . Hernia Son        Umbilical  . GI problems Son   . Pancreatic disease Daughter        pancreatectomy  . Cancer Daughter   . Diverticulitis Daughter   . Arthritis Daughter        knee   . Arthritis Daughter        shoulder   . Anemia Daughter   . GER disease Son   . Colon cancer Neg Hx   . Colon polyps Neg Hx     Social History   Socioeconomic History  . Marital status: Divorced    Spouse name: Not on file  . Number of children: 8  . Years of education: 68  . Highest education level: 11th grade  Occupational History  . 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 Smoking status: Never Smoker  . Smokeless tobacco: Never Used  Vaping Use  . Vaping Use: Never used  Substance and Sexual Activity  . Alcohol use: No    Alcohol/week: 0.0 standard drinks  . Drug use: No  . Sexual activity: Not Currently    Birth control/protection: Surgical    Comment: divorced, lives with daughter and granddaughters  Other Topics Concern  . 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   No daily caffeine use.   Social Determinants of Health   Financial Resource Strain: Low Risk   . Difficulty of Paying Living Expenses: Not very hard  Food Insecurity:  No Food Insecurity  . Worried About Charity fundraiser in the Last Year: Never true  . Ran Out of Food in the Last Year: Never true  Transportation Needs: No Transportation Needs  . Lack of Transportation (Medical): No  . Lack of Transportation (Non-Medical): No  Physical Activity: Inactive  . Days of Exercise per Week: 0 days  . Minutes of Exercise per  Session: 0 min  Stress: Stress Concern Present  . Feeling of Stress : To some extent  Social Connections: Socially Isolated  . Frequency of Communication with Friends and Family: More than three times a week  . Frequency of Social Gatherings with Friends and Family: More than three times a week  . Attends Religious Services: Never  . Active Member of Clubs or Organizations: No  . Attends Archivist Meetings: Never  . Marital Status: Widowed    Review of Systems: Gen: Denies fever, chills, anorexia. Denies fatigue, weakness, weight loss.  CV: Denies chest pain, palpitations, syncope, peripheral edema, and claudication. Resp: Denies dyspnea at rest, cough, wheezing, coughing up blood, and pleurisy. GI: see HPI Derm: Denies rash, itching, dry skin Psych: Denies depression, anxiety, memory loss, confusion. No homicidal or suicidal ideation.  Heme: Denies bruising, bleeding, and enlarged lymph nodes.  Physical Exam: BP (!) 155/71   Pulse 69   Temp (!) 97.1 F (36.2 C) (Temporal)   Ht '5\' 4"'$  (1.626 m)   Wt 251 lb 12.8 oz (114.2 kg)   BMI 43.22 kg/m  General:   Alert and oriented. No distress noted. Pleasant and cooperative.  Head:  Normocephalic and atraumatic. Eyes:  Conjuctiva clear without scleral icterus. Mouth:  Mask in place Abdomen:  +BS, soft, non-tender and non-distended. Obese. Limited exam as patient sitting in chair.  Extremities:  1-2+ lower extremity edema, pedal edema  Neurologic:  Alert and  oriented x4 Psych:  Alert and cooperative. Normal mood and affect.  ASSESSMENT: Shelly [name] is a 80 y.o.  female presenting today with a history of GERD, diverticular bleed several years ago, IDA with capsule study showing enteritis due to aspirin use, chronic GERD,chronic normocytic anemia with chronic diease component.  Constipation: well-managed with dietary measures. GERD is overall controlled and actually taking Protonix once daily prn. She is doing well from a GI standpoint. Prefers to continue frequent visits with her healthcare providers.    PLAN:  Continue Protonix prn Continue dietary measures for constipation Return in 3 months   Annitta Needs, PhD, [facility]

## 2020-10-10 NOTE — Patient Instructions (Signed)
I am glad you are doing well!  We will see you in 3 months!  Please call with any concerns!  I enjoyed seeing you again today! As you know, I value our relationship and want to provide genuine, compassionate, and quality care. I welcome your feedback. If you receive a survey regarding your visit,  I greatly appreciate you taking time to fill this out. See you next time!  Annitta Needs, PhD, ANP-BC [facility]

## 2020-10-16 DIAGNOSIS — D638 Anemia in other chronic diseases classified elsewhere: Secondary | ICD-10-CM | POA: Diagnosis not present

## 2020-10-16 DIAGNOSIS — E0590 Thyrotoxicosis, unspecified without thyrotoxic crisis or storm: Secondary | ICD-10-CM | POA: Diagnosis not present

## 2020-10-16 DIAGNOSIS — I129 Hypertensive chronic kidney disease with stage 1 through stage 4 chronic kidney disease, or unspecified chronic kidney disease: Secondary | ICD-10-CM | POA: Diagnosis not present

## 2020-10-16 DIAGNOSIS — I5032 Chronic diastolic (congestive) heart failure: Secondary | ICD-10-CM | POA: Diagnosis not present

## 2020-10-16 DIAGNOSIS — N184 Chronic kidney disease, stage 4 (severe): Secondary | ICD-10-CM | POA: Diagnosis not present

## 2020-10-16 DIAGNOSIS — E872 Acidosis: Secondary | ICD-10-CM | POA: Diagnosis not present

## 2020-10-17 DIAGNOSIS — I5032 Chronic diastolic (congestive) heart failure: Secondary | ICD-10-CM | POA: Diagnosis not present

## 2020-10-17 DIAGNOSIS — D638 Anemia in other chronic diseases classified elsewhere: Secondary | ICD-10-CM | POA: Diagnosis not present

## 2020-10-17 DIAGNOSIS — R809 Proteinuria, unspecified: Secondary | ICD-10-CM | POA: Diagnosis not present

## 2020-10-17 DIAGNOSIS — I129 Hypertensive chronic kidney disease with stage 1 through stage 4 chronic kidney disease, or unspecified chronic kidney disease: Secondary | ICD-10-CM | POA: Diagnosis not present

## 2020-10-17 DIAGNOSIS — E872 Acidosis: Secondary | ICD-10-CM | POA: Diagnosis not present

## 2020-10-17 DIAGNOSIS — N184 Chronic kidney disease, stage 4 (severe): Secondary | ICD-10-CM | POA: Diagnosis not present

## 2020-10-17 LAB — TSH: TSH: 0.161 u[IU]/mL — ABNORMAL LOW (ref 0.450–4.500)

## 2020-10-17 LAB — T4, FREE: Free T4: 1.02 ng/dL (ref 0.82–1.77)

## 2020-10-17 LAB — T3, FREE: T3, Free: 2.7 pg/mL (ref 2.0–4.4)

## 2020-11-01 ENCOUNTER — Ambulatory Visit (INDEPENDENT_AMBULATORY_CARE_PROVIDER_SITE_OTHER): Payer: Medicare Other | Admitting: Family Medicine

## 2020-11-01 ENCOUNTER — Encounter: Payer: Self-pay | Admitting: Family Medicine

## 2020-11-01 ENCOUNTER — Other Ambulatory Visit: Payer: Self-pay

## 2020-11-01 VITALS — BP 132/67 | HR 73 | Ht 64.0 in | Wt 249.0 lb

## 2020-11-01 DIAGNOSIS — N1832 Chronic kidney disease, stage 3b: Secondary | ICD-10-CM

## 2020-11-01 DIAGNOSIS — B0229 Other postherpetic nervous system involvement: Secondary | ICD-10-CM

## 2020-11-01 DIAGNOSIS — M1712 Unilateral primary osteoarthritis, left knee: Secondary | ICD-10-CM

## 2020-11-01 DIAGNOSIS — I10 Essential (primary) hypertension: Secondary | ICD-10-CM | POA: Diagnosis not present

## 2020-11-01 DIAGNOSIS — E0590 Thyrotoxicosis, unspecified without thyrotoxic crisis or storm: Secondary | ICD-10-CM | POA: Diagnosis not present

## 2020-11-01 MED ORDER — DICLOFENAC SODIUM 1 % EX GEL: 2.0000 g | Freq: Four times a day (QID) | CUTANEOUS | 3 refills | Status: DC

## 2020-11-01 MED ORDER — GABAPENTIN 100 MG PO CAPS: 100.0000 mg | ORAL_CAPSULE | Freq: Three times a day (TID) | ORAL | 3 refills | Status: DC

## 2020-11-01 NOTE — Progress Notes (Signed)
BP 132/67   Pulse 73   Ht _0  (1.626 m)   Wt 249 lb (112.9 kg)   SpO2 98%   BMI 42.74 kg/m    Subjective:   Patient ID: Shelly [name], female    DOB: July 01, 1940, 80 y.o.   MRN: [id number]  HPI: Shelly [name] is a 80 y.o. female presenting on 11/01/2020 for Medical Management of Chronic Issues, Hypothyroidism, and Hypertension   HPI Hypertension Patient is currently on amlodipine and chlorthalidone and furosemide and lisinopril and metoprolol, and their blood pressure today is 132/67. Patient denies any lightheadedness or dizziness. Patient denies headaches, blurred vision, chest pains, shortness of breath, or weakness. Denies any side effects from medication and is content with current medication.   Subclinical hyperthyroidism recheck Patient is coming in for thyroid recheck today as well. They deny any issues with hair changes or heat or cold problems or diarrhea or constipation. They deny any chest pain or palpitations. They are currently on no medication, is seeing endocrinology and they stopped the methimazole and she is doing fine.  She has follow-up with them.  Patient has postherpetic neuralgia and says that the Lyrica is not helping and she would like to try something different.  Looks like she did try gabapentin in the past and thinks it may have been better and would like to try it again.  She cannot recall if no because it was quite a time ago and it was before she was seeing [place].  Patient also has left knee osteoarthritis and wants something to help with that, she does recall the Voltaren gel and thinks it did help and is good with trying that again.  CKD stage III Patient has CKD 3 and follows with Dr. Nita Sickle from nephrology.  She has been stable.  Relevant past medical, surgical, family and social history reviewed and updated as indicated. Interim medical history since our last visit reviewed. Allergies and medications reviewed and updated.  Review of Systems   Constitutional:  Negative for chills and fever.  Eyes:  Negative for visual disturbance.  Respiratory:  Negative for chest tightness and shortness of breath.   Cardiovascular:  Negative for chest pain and leg swelling.  Musculoskeletal:  Positive for arthralgias. Negative for back pain and gait problem.  Skin:  Negative for color change and rash.  Neurological:  Negative for light-headedness and headaches.  Psychiatric/Behavioral:  Negative for agitation and behavioral problems.   All other systems reviewed and are negative.  Per HPI unless specifically indicated above   Allergies as of 11/01/2020       Reactions   Nsaids Other (See Comments)   AVOID all NSAID due to history of GI bleeding   Atorvastatin Other (See Comments)   Joint & muscle pain        Medication List        Accurate as of November 01, 2020  2:09 PM. If you have any questions, ask your nurse or doctor.          STOP taking these medications    pregabalin 100 MG capsule Commonly known as: Lyrica Stopped by: [name] Devanshi Califf, MD       TAKE these medications    amLODipine 5 MG tablet Commonly known as: NORVASC Take 2.5 mg by mouth daily.   cetirizine 10 MG tablet Commonly known as: ZYRTEC Take 10 mg by mouth daily.   chlorthalidone 25 MG tablet Commonly known as: HYGROTON Take 1 tablet (25 mg total)  by mouth daily.   clopidogrel 75 MG tablet Commonly known as: PLAVIX TAKE ONE TABLET BY MOUTH DAILY   clotrimazole-betamethasone cream Commonly known as: LOTRISONE APPLY AS DIRECTED TWICE DAILY.   diclofenac Sodium 1 % Gel Commonly known as: Voltaren Apply 2 g topically 4 (four) times daily. Started by: [name], MD   furosemide 40 MG tablet Commonly known as: LASIX Take 40 mg by mouth daily as needed (weight gain).   gabapentin 100 MG capsule Commonly known as: NEURONTIN Take 1 capsule (100 mg total) by mouth 3 (three) times daily. Started by: [name] Janila Arrazola, MD    hydrocortisone 2.5 % rectal cream Commonly known as: ANUSOL-HC Place 1 application rectally 2 (two) times daily. As needed for rectal discomfort.   lisinopril 40 MG tablet Commonly known as: ZESTRIL Take 1 tablet (40 mg total) by mouth daily.   metoprolol succinate 50 MG 24 hr tablet Commonly known as: Toprol XL Take 1 tablet (50 mg total) by mouth daily. Take with or immediately following a meal.   multivitamin tablet Take 1 tablet by mouth daily.   pantoprazole 40 MG tablet Commonly known as: PROTONIX Take 1 tablet (40 mg total) by mouth daily. 30 minutes before breakfast What changed:  when to take this reasons to take this   rosuvastatin 10 MG tablet Commonly known as: CRESTOR Take 1 tablet (10 mg total) by mouth daily.         Objective:   BP 132/67   Pulse 73   Ht _0  (1.626 m)   Wt 249 lb (112.9 kg)   SpO2 98%   BMI 42.74 kg/m   Wt Readings from Last 3 Encounters:  11/01/20 249 lb (112.9 kg)  10/10/20 251 lb 12.8 oz (114.2 kg)  08/29/20 251 lb 12.8 oz (114.2 kg)    Physical Exam Vitals and nursing note reviewed.  Constitutional:      General: She is not in acute distress.    Appearance: She is well-developed. She is not diaphoretic.  Eyes:     Conjunctiva/sclera: Conjunctivae normal.  Cardiovascular:     Rate and Rhythm: Normal rate and regular rhythm.     Heart sounds: Normal heart sounds. No murmur heard. Pulmonary:     Effort: Pulmonary effort is normal. No respiratory distress.     Breath sounds: Normal breath sounds. No wheezing.  Musculoskeletal:        General: Tenderness (Left knee pain with range of motion, no tenderness to palpation) present. No swelling.  Skin:    General: Skin is warm and dry.     Findings: No bruising, erythema, lesion or rash.  Neurological:     Mental Status: She is alert and oriented to person, place, and time.     Coordination: Coordination normal.  Psychiatric:        Behavior: Behavior normal.       Assessment & Plan:   Problem List Items Addressed This Visit       Cardiovascular and Mediastinum   Essential hypertension - Primary   Relevant Orders   CMP14+EGFR   Lipid panel     Endocrine   Subclinical hyperthyroidism     Nervous and Auditory   Postherpetic neuralgia   Relevant Medications   gabapentin (NEURONTIN) 100 MG capsule     Musculoskeletal and Integument   Osteoarthritis left knee   Relevant Medications   diclofenac Sodium (VOLTAREN) 1 % GEL     Genitourinary   CKD (chronic kidney disease), stage  III ([place])    Will switch from Lyrica to gabapentin and see how she does, gave her a tapering where she will taper up on the gabapentin.  Will give Voltaren gel for osteoarthritis in her left knee.  She will continue to follow with nephrology and endocrinology as well for her thyroid and kidney disease. Follow up plan: Return in about 6 months (around 05/03/2021), or if symptoms worsen or fail to improve, for Hypertension and hypothyroidism.  Counseling provided for all of the vaccine components Orders Placed This Encounter  Procedures   CMP14+EGFR   Lipid panel    Caryl Pina, MD [facility] 11/01/2020, 2:09 PM

## 2020-11-09 ENCOUNTER — Ambulatory Visit: Payer: Medicare Other | Admitting: Cardiology

## 2020-11-23 ENCOUNTER — Telehealth: Payer: Medicare Other

## 2020-11-27 ENCOUNTER — Other Ambulatory Visit: Payer: Self-pay | Admitting: Family Medicine

## 2020-12-11 ENCOUNTER — Other Ambulatory Visit: Payer: Self-pay | Admitting: Gastroenterology

## 2020-12-24 ENCOUNTER — Encounter: Payer: Self-pay | Admitting: Nurse Practitioner

## 2020-12-24 ENCOUNTER — Other Ambulatory Visit: Payer: Self-pay

## 2020-12-24 ENCOUNTER — Ambulatory Visit: Payer: Medicare Other | Admitting: Nurse Practitioner

## 2020-12-24 VITALS — BP 143/80 | HR 64 | Ht 64.0 in | Wt 244.0 lb

## 2020-12-24 DIAGNOSIS — E872 Acidosis: Secondary | ICD-10-CM | POA: Diagnosis not present

## 2020-12-24 DIAGNOSIS — N184 Chronic kidney disease, stage 4 (severe): Secondary | ICD-10-CM | POA: Diagnosis not present

## 2020-12-24 DIAGNOSIS — D638 Anemia in other chronic diseases classified elsewhere: Secondary | ICD-10-CM | POA: Diagnosis not present

## 2020-12-24 DIAGNOSIS — E0590 Thyrotoxicosis, unspecified without thyrotoxic crisis or storm: Secondary | ICD-10-CM

## 2020-12-24 DIAGNOSIS — I5032 Chronic diastolic (congestive) heart failure: Secondary | ICD-10-CM | POA: Diagnosis not present

## 2020-12-24 DIAGNOSIS — I129 Hypertensive chronic kidney disease with stage 1 through stage 4 chronic kidney disease, or unspecified chronic kidney disease: Secondary | ICD-10-CM | POA: Diagnosis not present

## 2020-12-24 NOTE — Progress Notes (Signed)
12/24/2020, 1:45 PM                                     Endocrinology follow-up note    Subjective:    Patient ID: Shelly [name], female    DOB: Apr 10, 1941, PCP [name], [name], MD   Past Medical History:  Diagnosis Date   Allergy    Anemia    GI bleed   Anginal pain ([place])    Anxiety    Arthritis    Blood transfusion without reported diagnosis    Cataract    Chronic kidney disease    Coronary artery disease    SEVERE   GERD (gastroesophageal reflux disease)    GI bleed 12/2013   Goiter    Hyperlipidemia    Hypertension    Myocardial infarct [facility])    Myocardial infarction ([place]) 08/2013   Neuralgia of right lower extremity 07/13/2017   Pancolonic diverticulosis 12/2013   Post herpetic neuralgia    Past Surgical History:  Procedure Laterality Date   CARPAL TUNNEL RELEASE Bilateral    COLONOSCOPY N/A 01/13/2014   Dr. Hung:pandiverticulosis    COLONOSCOPY N/A 12/14/2015   Dr. Michail Sermon: pancolonic diverticulosis, internal hemorrhoids    CORNEAL TRANSPLANT     CORONARY ANGIOPLASTY  08/2013   ESOPHAGOGASTRODUODENOSCOPY N/A 12/14/2015   Dr. Michail Sermon: normal    GIVENS CAPSULE STUDY N/A 08/13/2016   Dr. Oneida Alar: enteritis due to aspirin.    HEEL SPUR EXCISION     LEFT HEART CATHETERIZATION WITH CORONARY ANGIOGRAM N/A 08/23/2013   Procedure: LEFT HEART CATHETERIZATION WITH CORONARY ANGIOGRAM;  Surgeon: Peter M Martinique, MD;  [facility];  Service: Cardiovascular;  Laterality: N/A;   PERCUTANEOUS CORONARY STENT INTERVENTION (PCI-S) N/A 08/24/2013   Procedure: PERCUTANEOUS CORONARY STENT INTERVENTION (PCI-S);  Surgeon: Peter M Martinique, MD;  Location: [facility] CATH LAB;  Service: Cardiovascular;  Laterality: N/A;   TONSILLECTOMY     TUBAL LIGATION     Social History   Socioeconomic History   Marital status: Divorced    Spouse name: Not on file   Number of children: 8   Years of education: 11   Highest education level:  11th grade  Occupational History   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   No daily caffeine use.   Social Determinants of Health   Financial Resource Strain: Low Risk    Difficulty of Paying Living Expenses: Not very hard  Food Insecurity: No Food Insecurity   Worried About Running Out  of Food in the Last Year: Never true   [place] in the Last Year: Never true  Transportation Needs: No Transportation Needs   Lack of Transportation (Medical): No   Lack of Transportation (Non-Medical): No  Physical Activity: Inactive   Days of Exercise per Week: 0 days   Minutes of Exercise per Session: 0 min  Stress: Stress Concern Present   Feeling of Stress : To some extent  Social Connections: Socially Isolated   Frequency of Communication with Friends and Family: More than three times a week   Frequency of Social Gatherings with Friends and Family: More than three times a week   Attends Religious Services: Never   Marine scientist or Organizations: No   Attends Archivist Meetings: Never   Marital Status: Widowed   Family History  Problem Relation Age of Onset   Hypertension Brother    Transient ischemic attack Brother    Cancer Mother        unsure of type    Other Father        grangrene    Asthma Sister    Stroke Sister    COPD Sister    Stroke Sister    Early death Brother    Early death Brother    Liver  cancer Daughter    Cancer Daughter    Hernia Son        Umbilical   GI problems Son    Pancreatic disease Daughter        pancreatectomy   Cancer Daughter    Diverticulitis Daughter    Arthritis Daughter        knee    Arthritis Daughter        shoulder    Anemia Daughter    GER disease Son    Colon cancer Neg Hx    Colon polyps Neg Hx    Outpatient Encounter Medications as of 12/24/2020  Medication Sig   amLODipine (NORVASC) 5 MG tablet Take 2.5 mg by mouth daily.   cetirizine (ZYRTEC) 10 MG tablet Take 10 mg by mouth daily.   chlorthalidone (HYGROTON) 25 MG tablet Take 1 tablet (25 mg total) by mouth daily.   clopidogrel (PLAVIX) 75 MG tablet TAKE ONE TABLET BY MOUTH DAILY   clotrimazole-betamethasone (LOTRISONE) cream APPLY AS DIRECTED TWICE DAILY.   diclofenac Sodium (VOLTAREN) 1 % GEL Apply 2 g topically 4 (four) times daily.   furosemide (LASIX) 40 MG tablet Take 40 mg by mouth daily as needed (weight gain).   gabapentin (NEURONTIN) 100 MG capsule Take 1 capsule (100 mg total) by mouth 3 (three) times daily.   hydrocortisone (ANUSOL-HC) 2.5 % rectal cream Place 1 application rectally 2 (two) times daily. As needed for rectal discomfort.   lisinopril (ZESTRIL) 40 MG tablet Take 1 tablet (40 mg total) by mouth daily.   metoprolol succinate (TOPROL XL) 50 MG 24 hr tablet Take 1 tablet (50 mg total) by mouth daily. Take with or immediately following a meal.   Multiple Vitamin (MULTIVITAMIN) tablet Take 1 tablet by mouth daily.   pantoprazole (PROTONIX) 40 MG tablet Take 1 tablet (40 mg total) by mouth as needed. 30 minutes before breakfast   rosuvastatin (CRESTOR) 10 MG tablet Take 1 tablet (10 mg total) by mouth daily.   No facility-administered encounter medications on file as of 12/24/2020.   ALLERGIES: Allergies  Allergen Reactions   Nsaids Other (See Comments)    AVOID all NSAID due to history  of GI bleeding   Atorvastatin Other (See Comments)    Joint & muscle pain     VACCINATION STATUS: Immunization History  Administered Date(s) Administered   Pneumococcal Conjugate-13 10/02/2016   Pneumococcal Polysaccharide-23 08/25/2013   Zoster Recombinat (Shingrix) 03/17/2018    Thyroid Problem Presents for follow-up visit. Patient reports no anxiety, cold intolerance, constipation, depressed mood, fatigue, heat intolerance, leg swelling, palpitations, tremors, weight gain or weight loss. The symptoms have been stable.   SIMONE HINDI is 80 y.o. female who is seen in follow-up after she was seen in consultation for subclinical hyperthyroidism.    PMD: [name], [name], MD.  Due to septic symptoms of thyrotoxicosis, she was started on low-dose methimazole, which was since tapered down.  She has been off the Methimazole for some time now with stable thyroid response.  She denies any history of goiter, no family history of thyroid dysfunction or thyroid malignancy.  Her thyroid uptake and scan on June 14, 2019 showed 24-hour uptake of 10.3% which is normal.  There was asymmetric thyroid activity with generally increased uptake in the right lobe.  No focal hot or cold nodules identified.  Her medical history includes hypertension and hyperlipidemia on treatment.  Review of systems  Constitutional: + Minimally fluctuating body weight,  current Body mass index is 41.88 kg/m. , no fatigue, no subjective hyperthermia, no subjective hypothermia Eyes: no blurry vision, no xerophthalmia ENT: no sore throat, no nodules palpated in throat, no dysphagia/odynophagia, no hoarseness Cardiovascular: no chest pain, no shortness of breath, no palpitations, no leg swelling Respiratory: no cough, no shortness of breath Gastrointestinal: no nausea/vomiting/diarrhea Musculoskeletal: no muscle/joint aches Skin: no rashes, no hyperemia Neurological: no tremors, no numbness, no tingling, no dizziness Psychiatric: no depression, no anxiety  Objective:    BP (!)  143/80   Pulse 64   Ht '5\' 4"'$  (1.626 m)   Wt 244 lb (110.7 kg)   BMI 41.88 kg/m   Wt Readings from Last 3 Encounters:  12/24/20 244 lb (110.7 kg)  11/01/20 249 lb (112.9 kg)  10/10/20 251 lb 12.8 oz (114.2 kg)    BP Readings from Last 3 Encounters:  12/24/20 (!) 143/80  11/01/20 132/67  10/10/20 (!) 155/71     Physical Exam- Limited  Constitutional:  Body mass index is 41.88 kg/m. , not in acute distress, normal state of mind Eyes:  EOMI, no exophthalmos Neck: Supple Cardiovascular: RRR, no murmurs, rubs, or gallops, no edema Respiratory: Adequate breathing efforts, no crackles, rales, rhonchi, or wheezing Musculoskeletal: no gross deformities, strength intact in all four extremities, no gross restriction of joint movements Skin:  no rashes, no hyperemia Neurological: no tremor with outstretched hands    CMP     Component Value Date/Time   NA 140 07/30/2020 1444   K 4.9 07/30/2020 1444   CL 108 (H) 07/30/2020 1444   CO2 18 (L) 07/30/2020 1444   GLUCOSE 102 (H) 07/30/2020 1444   GLUCOSE 92 12/21/2018 1547   BUN 39 (H) 07/30/2020 1444   CREATININE 1.73 (H) 07/30/2020 1444   CREATININE 1.31 (H) 12/21/2018 1547   CALCIUM 9.7 07/30/2020 1444   PROT 7.4 07/30/2020 1444   ALBUMIN 4.1 07/30/2020 1444   AST 14 07/30/2020 1444   ALT 9 07/30/2020 1444   ALKPHOS 92 07/30/2020 1444   BILITOT <0.2 07/30/2020 1444   GFRNONAA 29 (L) 04/30/2020 1510   GFRAA 34 (L) 04/30/2020 1510     Diabetic Labs (most recent): Lab Results  Component  Value Date   HGBA1C 6.0 (H) 12/09/2015   HGBA1C 6.0 (H) 08/23/2013     Lipid Panel ( most recent) Lipid Panel     Component Value Date/Time   CHOL 186 07/30/2020 1444   TRIG 113 07/30/2020 1444   HDL 48 07/30/2020 1444   CHOLHDL 3.9 07/30/2020 1444   CHOLHDL 2.6 11/20/2016 0854   VLDL 11 11/20/2016 0854   LDLCALC 118 (H) 07/30/2020 1444   LABVLDL 20 07/30/2020 1444      Lab Results  Component Value Date   TSH 0.161 (L)  10/16/2020   TSH 0.227 (L) 07/04/2020   TSH 0.227 (L) 05/03/2020   TSH 0.218 (L) 04/30/2020   TSH 0.384 (L) 01/31/2020   TSH 0.55 01/02/2020   TSH 0.511 10/31/2019   TSH 0.28 (L) 09/23/2019   TSH 0.13 (L) 05/30/2019   TSH 0.347 (L) 04/06/2019   FREET4 1.02 10/16/2020   FREET4 1.00 07/04/2020   FREET4 0.97 05/03/2020   FREET4 1.0 01/02/2020   FREET4 0.9 09/23/2019   FREET4 0.9 05/30/2019   FREET4 1.04 12/11/2015   FREET4 1.09 10/19/2013      Assessment & Plan:   1. Subclinical hyperthyroidism -Her most recent thyroid function tests are stable, TSH still minimally suppressed but FT3 and FT4 are normal.  There is no need for her to restart Methimazole at this time.  Will monitor TFTs again in 4 months, if stable we can then push it out to 6 months.    - she is advised to maintain close follow up with [name], [name], MD for primary care needs.     I spent 20 minutes in the care of the patient today including review of labs from Thyroid Function, CMP, and other relevant labs ; imaging/biopsy records (current and previous including abstractions from other facilities); face-to-face time discussing  her lab results and symptoms, medications doses, her options of short and long term treatment based on the latest standards of care / guidelines;   and documenting the encounter.  Shelly [name]  participated in the discussions, expressed understanding, and voiced agreement with the above plans.  All questions were answered to her satisfaction. she is encouraged to contact clinic should she have any questions or concerns prior to her return visit.   Follow up plan: Return in about 4 months (around 04/25/2021) for Thyroid follow up, Previsit labs.   Rayetta Pigg, [facility] [address] [place], [place] [address] Phone: [phone] Fax: [phone]   12/24/2020, 1:45 PM

## 2020-12-28 DIAGNOSIS — I129 Hypertensive chronic kidney disease with stage 1 through stage 4 chronic kidney disease, or unspecified chronic kidney disease: Secondary | ICD-10-CM | POA: Diagnosis not present

## 2020-12-28 DIAGNOSIS — I5032 Chronic diastolic (congestive) heart failure: Secondary | ICD-10-CM | POA: Diagnosis not present

## 2020-12-28 DIAGNOSIS — R809 Proteinuria, unspecified: Secondary | ICD-10-CM | POA: Diagnosis not present

## 2020-12-28 DIAGNOSIS — N184 Chronic kidney disease, stage 4 (severe): Secondary | ICD-10-CM | POA: Diagnosis not present

## 2020-12-28 DIAGNOSIS — D638 Anemia in other chronic diseases classified elsewhere: Secondary | ICD-10-CM | POA: Diagnosis not present

## 2021-01-01 ENCOUNTER — Ambulatory Visit (INDEPENDENT_AMBULATORY_CARE_PROVIDER_SITE_OTHER): Payer: Medicare Other | Admitting: Licensed Clinical Social Worker

## 2021-01-01 DIAGNOSIS — M1712 Unilateral primary osteoarthritis, left knee: Secondary | ICD-10-CM

## 2021-01-01 DIAGNOSIS — I252 Old myocardial infarction: Secondary | ICD-10-CM

## 2021-01-01 DIAGNOSIS — E782 Mixed hyperlipidemia: Secondary | ICD-10-CM

## 2021-01-01 DIAGNOSIS — D508 Other iron deficiency anemias: Secondary | ICD-10-CM | POA: Diagnosis not present

## 2021-01-01 DIAGNOSIS — N1832 Chronic kidney disease, stage 3b: Secondary | ICD-10-CM

## 2021-01-01 DIAGNOSIS — I2510 Atherosclerotic heart disease of native coronary artery without angina pectoris: Secondary | ICD-10-CM | POA: Diagnosis not present

## 2021-01-01 DIAGNOSIS — I10 Essential (primary) hypertension: Secondary | ICD-10-CM

## 2021-01-01 DIAGNOSIS — F419 Anxiety disorder, unspecified: Secondary | ICD-10-CM

## 2021-01-01 DIAGNOSIS — K219 Gastro-esophageal reflux disease without esophagitis: Secondary | ICD-10-CM

## 2021-01-01 NOTE — Chronic Care Management (AMB) (Signed)
Chronic Care Management    Clinical Social Work Note  01/01/2021 Name: Shelly [name] MRN: [id number] DOB: 11/08/1940  Shelly [name] is a 80 y.o. year old female who is a primary care patient of [name], [name], MD. The CCM team was consulted to assist the patient with chronic disease management and/or care coordination needs related to: [employer] .   Engaged with patient by telephone for follow up visit in response to provider referral for social work chronic care management and care coordination services.   Consent to Services:  The patient was given information about Chronic Care Management services, agreed to services, and gave verbal consent prior to initiation of services.  Please see initial visit note for detailed documentation.   Patient agreed to services and consent obtained.   Assessment: Review of patient past medical history, allergies, medications, and health status, including review of relevant consultants reports was performed today as part of a comprehensive evaluation and provision of chronic care management and care coordination services.     SDOH (Social Determinants of Health) assessments and interventions performed:  SDOH Interventions    Flowsheet Row Most Recent Value  SDOH Interventions   Physical Activity Interventions Other (Comments)  [has pain in her knee which may affect walking or exercise. She has pain ongoing related to Shingles]  Depression Interventions/Treatment  --  [informed client of LCSW support and of RNCM support]        Advanced Directives Status: See Vynca application for related entries.  CCM Care Plan  Allergies  Allergen Reactions   Nsaids Other (See Comments)    AVOID all NSAID due to history of GI bleeding   Atorvastatin Other (See Comments)    Joint & muscle pain    Outpatient Encounter Medications as of 01/01/2021  Medication Sig   amLODipine (NORVASC) 5 MG tablet Take 2.5 mg by mouth daily.    cetirizine (ZYRTEC) 10 MG tablet Take 10 mg by mouth daily.   chlorthalidone (HYGROTON) 25 MG tablet Take 1 tablet (25 mg total) by mouth daily.   clopidogrel (PLAVIX) 75 MG tablet TAKE ONE TABLET BY MOUTH DAILY   clotrimazole-betamethasone (LOTRISONE) cream APPLY AS DIRECTED TWICE DAILY.   diclofenac Sodium (VOLTAREN) 1 % GEL Apply 2 g topically 4 (four) times daily.   furosemide (LASIX) 40 MG tablet Take 40 mg by mouth daily as needed (weight gain).   gabapentin (NEURONTIN) 100 MG capsule Take 1 capsule (100 mg total) by mouth 3 (three) times daily.   hydrocortisone (ANUSOL-HC) 2.5 % rectal cream Place 1 application rectally 2 (two) times daily. As needed for rectal discomfort.   lisinopril (ZESTRIL) 40 MG tablet Take 1 tablet (40 mg total) by mouth daily.   metoprolol succinate (TOPROL XL) 50 MG 24 hr tablet Take 1 tablet (50 mg total) by mouth daily. Take with or immediately following a meal.   Multiple Vitamin (MULTIVITAMIN) tablet Take 1 tablet by mouth daily.   pantoprazole (PROTONIX) 40 MG tablet Take 1 tablet (40 mg total) by mouth as needed. 30 minutes before breakfast   rosuvastatin (CRESTOR) 10 MG tablet Take 1 tablet (10 mg total) by mouth daily.   No facility-administered encounter medications on file as of 01/01/2021.    Patient Active Problem List   Diagnosis Date Noted   Subclinical hyperthyroidism 06/06/2019   Normocytic anemia 06/02/2019   Postherpetic neuralgia 09/28/2018   Herpes zoster without complication 96/28/3662   Iron deficiency anemia 08/27/2016   Morbid obesity ([place]) 08/27/2016  CKD (chronic kidney disease), stage III ([place]) 04/04/2016   Osteoarthritis left knee 04/04/2016   Third degree heart block ([place]) 12/10/2015   Pancolonic diverticulosis 12/09/2015   CAD (coronary artery disease) 01/11/2014   History of non-ST elevation myocardial infarction (NSTEMI) 08/23/2013   IDA (iron deficiency anemia) 08/23/2013   Essential hypertension 08/22/2013   GERD  (gastroesophageal reflux disease) 08/22/2013   Anxiety 08/22/2013    Conditions to be addressed/monitored: Monitor client management of depression issues and of grief issues   Care Plan : LCSW Care Plan  Updates made by Katha Cabal, LCSW since 01/01/2021 12:00 AM     Problem: Depression Identification (Depression)      Goal: Depressive Symptoms Identified;Manage Depression symptoms and manage grief symptoms   Start Date: 01/01/2021  Expected End Date: 03/28/2021  This Visit's Progress: On track  Recent Progress: On track  Priority: High  Note:   Current Barriers:  Chronic Mental Health needs related to anxiey issues and grief issues Suicidal Ideation/Homicidal Ideation: No Depression issues  Clinical Social Work Goal(s):  patient will work with SW monthly by telephone or in person to reduce or manage symptoms related to anxiety and grief issues of client patient will attend all scheduled medical appointments as evidenced by patient report and care team review of appointment completion in EMR:   Patient will talk with Dr. Lottie Dawson, [facility] Pharmacist in next 30 days to discuss medication use of client and options for client related to managing Shingles  Interventions:  1:1 collaboration with Dr. [name] [name] regarding development and update of comprehensive plan of care as evidenced by provider attestation and co-signature Talked with client  about pain issues of client . She said she has knee pain occasionally. She said she has ongoing pain related to Shingles Talked with client about self care, (getting adequate rest, engaging in social activities of choice) Talked with client about ambulation of client (client has a walker to use as needed) Talked with client  about upcoming medical appointments Talked with client about energy level of client. She said she has decreased energy Talked with client about family support (has daughter who is supportive and has two  granddaughters that are supportive) Encouraged client to call RNCM as needed for nursing support Talked with client about mood of client (client said her mood can go up and down; but sometimes it may be related to pain from Shingles. She said she feels that "When she wakes up, her Shingles affect her related to pain ; during day affect her, and until she goes to sleep, pain is present. She said sometimes she wakes up from her sleep because of pain related to Shingles" Talked with client about relaxation techniques . She said she does not have many hobbies or activities to help her relax.  Provided counseling support for client LCSW met with Dr. Blanca Friend, Pharmacist , today at [facility]. LCSW, Dr. Blanca Friend, and client spoke via phone call today about Shingles pain issues of client. Dr. Blanca Friend said she would consult with Dr. Warrick Parisian related to options for Shingles management for client. Dr. Blanca Friend also scheduled to call client in 2 weeks to discuss her needs. Client agreed to this plan Talked with client about transport needs of client (she said her family helps transport her to appointments as needed. Talked with client about appetite. She said she has a decreased appetite. Encouraged client to call LCSW as needed in next 30 days for CCM program support  Patient Self Care Activities:  Eats meals independently Takes medications as prescribed Attends scheduled medical appointments  Patient Coping Strengths:  Completes ADLs Completes IADLS Makes transport arrangements as needed Attends medical appointments Communicates with LCSW or RNCM as needed for CCM support  Patient Self Care Deficits:  Grief issues Depression issues  Patient Goals:  - spend time or talk with others every day - practice relaxation or meditation daily - keep a calendar with appointment dates Talk with RNCM or LCSW as needed for support  Follow Up Plan: LCSW to call client on 02/08/21 to assess client needs      Norva Riffle.Mercedes Fort MSW, LCSW Licensed Clinical Social Worker [facility] Care Management [phone]

## 2021-01-01 NOTE — Patient Instructions (Signed)
Visit Information  PATIENT GOALS:  Goals Addressed               This Visit's Progress     Manage Grief issues faced; manage depression issues faced (pt-stated)        Timeframe:  Short-Term Goal Priority:  Medium Progress: On Track Start Date:  01/01/21                        Expected End Date: 03/28/21                     Follow Up Date  02/08/21   Manage My Emotions (Patient) Manage Grief issues faced ; Manage depression issues faced   Why is this important?   When you are stressed, down or upset, your body reacts too.  For example, your blood pressure may get higher; you may have a headache or stomachache.  When your emotions get the best of you, your body's ability to fight off cold and flu gets weak.  These steps will help you manage your emotions.     Patient Self Care Activities:  Eats meals independently Takes medications as prescribed Attends scheduled medical appointments  Patient Coping Strengths:  Completes ADLs Completes IADLS Makes transport arrangements as needed Attends medical appointments Communicates with LCSW or RNCM as needed for CCM support  Patient Self Care Deficits:  Grief issues Depression issues  Patient Goals:  - spend time or talk with others every day - practice relaxation or meditation daily - keep a calendar with appointment dates Talk with RNCM or LCSW as needed for support  Follow Up Plan: LCSW to call client on 02/08/21 to assess client needs     Norva Riffle.Jakari Sada MSW, LCSW Licensed Clinical Social Worker [facility] Care Management [phone]

## 2021-01-03 ENCOUNTER — Encounter (HOSPITAL_COMMUNITY): Payer: Self-pay | Admitting: *Deleted

## 2021-01-03 ENCOUNTER — Other Ambulatory Visit: Payer: Self-pay

## 2021-01-03 ENCOUNTER — Emergency Department (HOSPITAL_COMMUNITY): Payer: Medicare Other

## 2021-01-03 ENCOUNTER — Emergency Department (HOSPITAL_COMMUNITY)
Admission: EM | Admit: 2021-01-03 | Discharge: 2021-01-03 | Disposition: A | Discharge status: Home / Self Care | Payer: Medicare Other | Attending: Emergency Medicine | Admitting: Emergency Medicine

## 2021-01-03 DIAGNOSIS — Z79899 Other long term (current) drug therapy: Secondary | ICD-10-CM | POA: Insufficient documentation

## 2021-01-03 DIAGNOSIS — M25532 Pain in left wrist: Secondary | ICD-10-CM | POA: Diagnosis not present

## 2021-01-03 DIAGNOSIS — I2510 Atherosclerotic heart disease of native coronary artery without angina pectoris: Secondary | ICD-10-CM | POA: Insufficient documentation

## 2021-01-03 DIAGNOSIS — Y92017 Garden or yard in single-family (private) house as the place of occurrence of the external cause: Secondary | ICD-10-CM | POA: Diagnosis not present

## 2021-01-03 DIAGNOSIS — I10 Essential (primary) hypertension: Secondary | ICD-10-CM | POA: Diagnosis not present

## 2021-01-03 DIAGNOSIS — W010XXA Fall on same level from slipping, tripping and stumbling without subsequent striking against object, initial encounter: Secondary | ICD-10-CM | POA: Insufficient documentation

## 2021-01-03 DIAGNOSIS — W19XXXA Unspecified fall, initial encounter: Secondary | ICD-10-CM

## 2021-01-03 DIAGNOSIS — R519 Headache, unspecified: Secondary | ICD-10-CM | POA: Diagnosis not present

## 2021-01-03 DIAGNOSIS — Z7902 Long term (current) use of antithrombotics/antiplatelets: Secondary | ICD-10-CM | POA: Insufficient documentation

## 2021-01-03 DIAGNOSIS — S0990XA Unspecified injury of head, initial encounter: Secondary | ICD-10-CM | POA: Diagnosis not present

## 2021-01-03 DIAGNOSIS — N183 Chronic kidney disease, stage 3 unspecified: Secondary | ICD-10-CM | POA: Diagnosis not present

## 2021-01-03 DIAGNOSIS — I129 Hypertensive chronic kidney disease with stage 1 through stage 4 chronic kidney disease, or unspecified chronic kidney disease: Secondary | ICD-10-CM | POA: Insufficient documentation

## 2021-01-03 DIAGNOSIS — M7989 Other specified soft tissue disorders: Secondary | ICD-10-CM | POA: Diagnosis not present

## 2021-01-03 LAB — BASIC METABOLIC PANEL
Anion gap: 4 — ABNORMAL LOW (ref 5–15)
BUN: 30 mg/dL — ABNORMAL HIGH (ref 8–23)
CO2: 24 mmol/L (ref 22–32)
Calcium: 9.2 mg/dL (ref 8.9–10.3)
Chloride: 112 mmol/L — ABNORMAL HIGH (ref 98–111)
Creatinine, Ser: 1.6 mg/dL — ABNORMAL HIGH (ref 0.44–1.00)
GFR, Estimated: 33 mL/min — ABNORMAL LOW (ref >60)
Glucose, Bld: 120 mg/dL — ABNORMAL HIGH (ref 70–99)
Potassium: 4.8 mmol/L (ref 3.5–5.1)
Sodium: 140 mmol/L (ref 135–145)

## 2021-01-03 LAB — CBG MONITORING, ED: Glucose-Capillary: 101 mg/dL — ABNORMAL HIGH (ref 70–99)

## 2021-01-03 LAB — CBC
HCT: 35.3 % — ABNORMAL LOW (ref 36.0–46.0)
Hemoglobin: 11.1 g/dL — ABNORMAL LOW (ref 12.0–15.0)
MCH: 30.7 pg (ref 26.0–34.0)
MCHC: 31.4 g/dL (ref 30.0–36.0)
MCV: 97.8 fL (ref 80.0–100.0)
Platelets: 308 10*3/uL (ref 150–400)
RBC: 3.61 MIL/uL — ABNORMAL LOW (ref 3.87–5.11)
RDW: 13.8 % (ref 11.5–15.5)
WBC: 5.7 10*3/uL (ref 4.0–10.5)
nRBC: 0 % (ref 0.0–0.2)

## 2021-01-03 LAB — URINALYSIS, ROUTINE W REFLEX MICROSCOPIC
Bilirubin Urine: NEGATIVE
Glucose, UA: NEGATIVE mg/dL
Hgb urine dipstick: NEGATIVE
Ketones, ur: NEGATIVE mg/dL
Leukocytes,Ua: NEGATIVE
Nitrite: NEGATIVE
Protein, ur: NEGATIVE mg/dL
Specific Gravity, Urine: 1.013 (ref 1.005–1.030)
pH: 6 (ref 5.0–8.0)

## 2021-01-03 NOTE — Discharge Instructions (Addendum)
Your CT scan of the head did not show any injury to the brain.  The x-ray of your left wrist did not show any obvious broken bones.  However, we talked about the possibility that there is a small fracture we cannot see today.  If you are still having pain and swelling in your left wrist, would recommend that your doctor order you another x-ray in about 7-10 days.  If there is a broken bone you may need to see an orthopedic doctor, and I gave you the office information for Dr Amedeo Kinsman (orthopedics).  Please be careful using her walker at home.  If you need to use both hands, you can take off the wrist brace for that.  Take your time standing up.  You will be very sore for the next few days, worse tomorrow than today.  You will likely have muscle soreness in your back.  You can take Tylenol at home and use over-the-counter muscle rubs for muscle soreness.

## 2021-01-03 NOTE — ED Provider Notes (Signed)
[facility] EMERGENCY DEPARTMENT Provider Note   CSN: [id number] Arrival date & time: 01/03/21  1655     History Chief Complaint  Patient presents with   Lytle Michaels    Shelly [name] is a 80 y.o. female on Plavix presenting emergency department for mechanical fall.  Supplemental history is provided by the patient's daughter.  She reports that the patient was in the yard using a walker and tripped over a ledge coming into the house.  Patient fell onto her outstretched left hand.  She reports that she bumped her forehead on the ground.  There is no loss of consciousness.  Patient self is reporting a mild frontal headache as well as pain and swelling in her left wrist.  She denies injuries or pain anywhere else.  HPI     Past Medical History:  Diagnosis Date   Allergy    Anemia    GI bleed   Anginal pain ([place])    Anxiety    Arthritis    Blood transfusion without reported diagnosis    Cataract    Chronic kidney disease    Coronary artery disease    SEVERE   GERD (gastroesophageal reflux disease)    GI bleed 12/2013   Goiter    Hyperlipidemia    Hypertension    Myocardial infarct [facility])    Myocardial infarction ([place]) 08/2013   Neuralgia of right lower extremity 07/13/2017   Pancolonic diverticulosis 12/2013   Post herpetic neuralgia     Patient Active Problem List   Diagnosis Date Noted   Subclinical hyperthyroidism 06/06/2019   Normocytic anemia 06/02/2019   Postherpetic neuralgia 09/28/2018   Herpes zoster without complication [id number]   Iron deficiency anemia 08/27/2016   Morbid obesity ([place]) 08/27/2016   CKD (chronic kidney disease), stage III ([place]) 04/04/2016   Osteoarthritis left knee 04/04/2016   Third degree heart block ([place]) 12/10/2015   Pancolonic diverticulosis 12/09/2015   CAD (coronary artery disease) 01/11/2014   History of non-ST elevation myocardial infarction (NSTEMI) 08/23/2013   IDA (iron deficiency anemia) 08/23/2013   Essential hypertension  08/22/2013   GERD (gastroesophageal reflux disease) 08/22/2013   Anxiety 08/22/2013    Past Surgical History:  Procedure Laterality Date   CARPAL TUNNEL RELEASE Bilateral    COLONOSCOPY N/A 01/13/2014   Dr. Hung:pandiverticulosis    COLONOSCOPY N/A 12/14/2015   Dr. Michail Sermon: pancolonic diverticulosis, internal hemorrhoids    CORNEAL TRANSPLANT     CORONARY ANGIOPLASTY  08/2013   ESOPHAGOGASTRODUODENOSCOPY N/A 12/14/2015   Dr. Michail Sermon: normal    GIVENS CAPSULE STUDY N/A 08/13/2016   Dr. Oneida Alar: enteritis due to aspirin.    HEEL SPUR EXCISION     LEFT HEART CATHETERIZATION WITH CORONARY ANGIOGRAM N/A 08/23/2013   Procedure: LEFT HEART CATHETERIZATION WITH CORONARY ANGIOGRAM;  Surgeon: Peter M Martinique, MD;  Location: [facility] CATH LAB;  Service: Cardiovascular;  Laterality: N/A;   PERCUTANEOUS CORONARY STENT INTERVENTION (PCI-S) N/A 08/24/2013   Procedure: PERCUTANEOUS CORONARY STENT INTERVENTION (PCI-S);  Surgeon: Peter M Martinique, MD;  Location: [facility] CATH LAB;  Service: Cardiovascular;  Laterality: N/A;   TONSILLECTOMY     TUBAL LIGATION       OB History   No obstetric history on file.     Family History  Problem Relation Age of Onset   Hypertension Brother    Transient ischemic attack Brother    Cancer Mother        unsure of type    Other Father  grangrene    Asthma Sister    Stroke Sister    COPD Sister    Stroke Sister    Early death Brother    Early death Brother    Liver cancer Daughter    Cancer Daughter    Hernia Son        Umbilical   GI problems Son    Pancreatic disease Daughter        pancreatectomy   Cancer Daughter    Diverticulitis Daughter    Arthritis Daughter        knee    Arthritis Daughter        shoulder    Anemia Daughter    GER disease Son    Colon cancer Neg Hx    Colon polyps Neg Hx     Social History   Tobacco Use   Smoking status: Never   Smokeless tobacco: Never  Vaping Use   Vaping Use: Never used  Substance Use Topics   Alcohol  use: No    Alcohol/week: 0.0 standard drinks   Drug use: No    Home Medications Prior to Admission medications   Medication Sig Start Date End Date Taking? Authorizing Provider  amLODipine (NORVASC) 5 MG tablet Take 2.5 mg by mouth daily.   Yes [provider]  cetirizine (ZYRTEC) 10 MG tablet Take 10 mg by mouth daily.   Yes [provider]  chlorthalidone (HYGROTON) 25 MG tablet Take 1 tablet (25 mg total) by mouth daily. 04/30/20  Yes [name], [name], MD  clopidogrel (PLAVIX) 75 MG tablet TAKE ONE TABLET BY MOUTH DAILY 11/27/20  Yes [name], [name], MD  diclofenac Sodium (VOLTAREN) 1 % GEL Apply 2 g topically 4 (four) times daily. 11/01/20  Yes [name], [name], MD  furosemide (LASIX) 40 MG tablet Take 40 mg by mouth daily as needed (weight gain).   Yes [provider]  gabapentin (NEURONTIN) 100 MG capsule Take 1 capsule (100 mg total) by mouth 3 (three) times daily. 11/01/20  Yes [name], [name], MD  hydrocortisone (ANUSOL-HC) 2.5 % rectal cream Place 1 application rectally 2 (two) times daily. As needed for rectal discomfort. 03/06/20  Yes Annitta Needs, NP  lisinopril (ZESTRIL) 40 MG tablet Take 1 tablet (40 mg total) by mouth daily. 04/30/20  Yes [name], [name], MD  metoprolol succinate (TOPROL XL) 50 MG 24 hr tablet Take 1 tablet (50 mg total) by mouth daily. Take with or immediately following a meal. 01/24/20  Yes Verta Ellen., NP  Multiple Vitamin (MULTIVITAMIN) tablet Take 1 tablet by mouth daily.   Yes [provider]  pantoprazole (PROTONIX) 40 MG tablet Take 1 tablet (40 mg total) by mouth as needed. 30 minutes before breakfast 12/13/20  Yes Harper, Kristen S, PA-C  rosuvastatin (CRESTOR) 5 MG tablet Take 5 mg by mouth daily. 11/27/20  Yes [provider]  clotrimazole-betamethasone (LOTRISONE) cream APPLY AS DIRECTED TWICE DAILY. Patient not taking: No sig reported 11/04/19   [name], [name], MD  rosuvastatin  (CRESTOR) 10 MG tablet Take 1 tablet (10 mg total) by mouth daily. Patient not taking: No sig reported 08/29/20   Arnoldo Lenis, MD    Allergies    Nsaids and Atorvastatin  Review of Systems   Review of Systems  Constitutional:  Negative for chills and fever.  Eyes:  Negative for pain and visual disturbance.  Respiratory:  Negative for cough and shortness of breath.   Cardiovascular:  Negative  for chest pain and palpitations.  Gastrointestinal:  Negative for abdominal pain and vomiting.  Musculoskeletal:  Positive for arthralgias and myalgias.  Skin:  Negative for color change and rash.  Neurological:  Positive for headaches. Negative for syncope.  All other systems reviewed and are negative.  Physical Exam Updated Vital Signs BP (!) 142/65   Pulse 63   Temp 100 F (37.8 C) (Oral)   Resp 17   SpO2 100%   Physical Exam Constitutional:      General: She is not in acute distress. HENT:     Head: Normocephalic and atraumatic.  Eyes:     Conjunctiva/sclera: Conjunctivae normal.     Pupils: Pupils are equal, round, and reactive to light.  Cardiovascular:     Rate and Rhythm: Normal rate and regular rhythm.  Pulmonary:     Effort: Pulmonary effort is normal. No respiratory distress.  Abdominal:     General: There is no distension.     Tenderness: There is no abdominal tenderness.  Musculoskeletal:     Comments: Swelling and tenderness of distal ulnar aspect of left wrist  Skin:    General: Skin is warm and dry.  Neurological:     General: No focal deficit present.     Mental Status: She is alert and oriented to person, place, and time. Mental status is at baseline.     Sensory: No sensory deficit.     Motor: No weakness.    ED Results / Procedures / Treatments   Labs (all labs ordered are listed, but only abnormal results are displayed) Labs Reviewed  BASIC METABOLIC PANEL - Abnormal; Notable for the following components:      Result Value   Chloride 112 (*)     Glucose, Bld 120 (*)    BUN 30 (*)    Creatinine, Ser 1.60 (*)    GFR, Estimated 33 (*)    Anion gap 4 (*)    All other components within normal limits  CBC - Abnormal; Notable for the following components:   RBC 3.61 (*)    Hemoglobin 11.1 (*)    HCT 35.3 (*)    All other components within normal limits  CBG MONITORING, ED - Abnormal; Notable for the following components:   Glucose-Capillary 101 (*)    All other components within normal limits  URINALYSIS, ROUTINE W REFLEX MICROSCOPIC    EKG None  Radiology DG Wrist Complete Left  Result Date: 01/03/2021 CLINICAL DATA:  Fall EXAM: LEFT WRIST - COMPLETE 3+ VIEW COMPARISON:  None. FINDINGS: There is severe first carpometacarpal joint degenerative change. There is no evidence of acute fracture. Moderate radiocarpal arthritis. The scaphoid is rotated. Mild widening of the scapholunate interval. Dorsal soft tissue swelling. IMPRESSION: No acute fracture. Mild widening of the scapholunate interval which can be seen scapholunate ligament injury. Dorsal soft tissue swelling. Severe base of thumb osteoarthritis. Electronically Signed   By: Maurine Simmering M.D.   On: 01/03/2021 20:43   CT HEAD WO CONTRAST (5MM)  Result Date: 01/03/2021 CLINICAL DATA:  Recent fall with headaches, initial encounter EXAM: CT HEAD WITHOUT CONTRAST TECHNIQUE: Contiguous axial images were obtained from the base of the skull through the vertex without intravenous contrast. COMPARISON:  12/09/2015 FINDINGS: Brain: No evidence of acute infarction, hemorrhage, hydrocephalus, extra-axial collection or mass lesion/mass effect. Bilateral basal ganglia calcifications are noted. Vascular: No hyperdense vessel or unexpected calcification. Skull: Normal. Negative for fracture or focal lesion. Sinuses/Orbits: No acute finding. Other: None IMPRESSION: No acute  intracranial abnormality noted. Electronically Signed   By: Inez Catalina M.D.   On: 01/03/2021 21:15     Procedures Procedures   Medications Ordered in ED Medications - No data to display  ED Course  I have reviewed the triage vital signs and the nursing notes.  Pertinent labs & imaging results that were available during my care of the patient were reviewed by me and considered in my medical decision making (see chart for details).  Patient is here with a mechanical fall at home, injury to the head and left wrist.  This does not appear to be a syncopal episode.  Her labs are baseline level including her blood sugar, creatinine level, hemoglobin and white blood cell count.  No evidence of infection on UA.  CT scan of the head does not show any obvious intracranial bleed or injury.  X-ray of the wrist show some soft tissue swelling without acute fracture.  I discussed with him the possibility of a delayed presentation or visualization of fracture, and recommended that we place the wrist in a Velcro sling, and that she have repeat imaging in 7 to 14 days.  They can do this with her PCP.  Otherwise she is stable on her feet, ambulated to the bathroom.  She has no other complaints.  No chest pain or shortness of breath.  EKG shows no acute ischemic changes and is unchanged from prior imaging.  The patient's daughter was here to provide supplemental history.  Okay for discharge    Final Clinical Impression(s) / ED Diagnoses Final diagnoses:  Fall, initial encounter  Left wrist pain  Injury of head, initial encounter    Rx / DC Orders ED Discharge Orders     None        Dianne Bady, Carola Rhine, MD 01/03/21 2341

## 2021-01-03 NOTE — ED Triage Notes (Signed)
Fell at home, pain in left wrist and swelling to forehead. Not sure why she fell or if she had a syncopal episode

## 2021-01-09 ENCOUNTER — Telehealth: Payer: Self-pay | Admitting: Family Medicine

## 2021-01-09 ENCOUNTER — Ambulatory Visit: Payer: Medicare Other | Admitting: Family Medicine

## 2021-01-09 ENCOUNTER — Telehealth: Payer: Self-pay

## 2021-01-09 NOTE — Telephone Encounter (Signed)
Pt had an appt this am and was unable to make it due to oversleeping.  Pt is scheduled for the 31st but would like to be seen sooner for her hand that she injured when she fell.  We had a cancellation at 12:55 today and I have scheduled the patient. Tried calling on both numbers lister. Cell has no vmail and home the mailbox is full.

## 2021-01-09 NOTE — Telephone Encounter (Signed)
Spoke with pt. She does not have any new symptoms.  Denies N&V, no headache, no change in vision. Concussion was rullled out while in ER.  She would like for [name] to look at her hand that she injured during her fall. That is her main concern.   We will keep appt on the 31st and pt will call for cancellations before then.  Advised pt to go to ER if she has any changes in her mental status.

## 2021-01-10 ENCOUNTER — Encounter: Payer: Self-pay | Admitting: Family Medicine

## 2021-01-10 ENCOUNTER — Ambulatory Visit (INDEPENDENT_AMBULATORY_CARE_PROVIDER_SITE_OTHER): Payer: Medicare Other | Admitting: Family Medicine

## 2021-01-10 ENCOUNTER — Other Ambulatory Visit: Payer: Self-pay

## 2021-01-10 VITALS — BP 132/73 | HR 85 | Temp 97.0°F | Resp 20 | Ht 64.0 in | Wt 245.0 lb

## 2021-01-10 DIAGNOSIS — W19XXXA Unspecified fall, initial encounter: Secondary | ICD-10-CM | POA: Diagnosis not present

## 2021-01-10 DIAGNOSIS — R29898 Other symptoms and signs involving the musculoskeletal system: Secondary | ICD-10-CM

## 2021-01-10 DIAGNOSIS — S63502A Unspecified sprain of left wrist, initial encounter: Secondary | ICD-10-CM

## 2021-01-10 DIAGNOSIS — M1712 Unilateral primary osteoarthritis, left knee: Secondary | ICD-10-CM

## 2021-01-10 NOTE — Patient Instructions (Signed)
[employer] water aerobics Classes Monday/Wednesday/Friday: 8am-8:45am Tuesday/Thursday: 8am-8:45am Monday and Friday: 5pm-5:45pm Wednesday: 4pm-4:45pm   Registration begins at 11am on the 15 of each month for the following month. Register online, by calling [phone] or coming to the branch!

## 2021-01-10 NOTE — Progress Notes (Signed)
BP 132/73   Pulse 85   Temp (!) 97 F (36.1 C) (Temporal)   Resp 20   Ht '5\' 4"'$  (1.626 m)   Wt 245 lb (111.1 kg)   SpO2 97%   BMI 42.05 kg/m    Subjective:   Patient ID: Shelly [name], female    DOB: [birth year], 80 y.o.   MRN: [id number]  HPI: Shelly [name] is a 80 y.o. female presenting on 01/10/2021 for Wrist Pain   HPI Patient had a fall on 01/03/2021 and seen in the emergency department for it.  Patient was using a walker in the ER and tripped over a ledge coming into the house.  She fell onto her outstretched left hand and bumped her forehead.  In the emergency department they did a CT head and a wrist x-ray left.  She is still having pain in her left wrist with movement.  She says her forehead the swelling has gone down.  She has been using heat on her left wrist and she feels like that is better than ice.  She has trouble using a walker because of her left wrist pain and that has made things worse on her left knee.  Relevant past medical, surgical, family and social history reviewed and updated as indicated. Interim medical history since our last visit reviewed. Allergies and medications reviewed and updated.  Review of Systems  Constitutional:  Negative for chills and fever.  Eyes:  Negative for redness and visual disturbance.  Respiratory:  Negative for chest tightness and shortness of breath.   Cardiovascular:  Negative for chest pain and leg swelling.  Musculoskeletal:  Positive for arthralgias and joint swelling. Negative for back pain and gait problem.  Skin:  Negative for rash.  Neurological:  Negative for light-headedness and headaches.  Psychiatric/Behavioral:  Negative for agitation and behavioral problems.   All other systems reviewed and are negative.  Per HPI unless specifically indicated above   Allergies as of 01/10/2021       Reactions   Nsaids Other (See Comments)   AVOID all NSAID due to history of GI bleeding   Atorvastatin Other (See  Comments)   Joint & muscle pain        Medication List        Accurate as of January 10, 2021  1:32 PM. If you have any questions, ask your nurse or doctor.          amLODipine 5 MG tablet Commonly known as: NORVASC Take 2.5 mg by mouth daily.   cetirizine 10 MG tablet Commonly known as: ZYRTEC Take 10 mg by mouth daily.   chlorthalidone 25 MG tablet Commonly known as: HYGROTON Take 1 tablet (25 mg total) by mouth daily.   clopidogrel 75 MG tablet Commonly known as: PLAVIX TAKE ONE TABLET BY MOUTH DAILY   clotrimazole-betamethasone cream Commonly known as: LOTRISONE APPLY AS DIRECTED TWICE DAILY.   diclofenac Sodium 1 % Gel Commonly known as: Voltaren Apply 2 g topically 4 (four) times daily.   furosemide 40 MG tablet Commonly known as: LASIX Take 40 mg by mouth daily as needed (weight gain).   gabapentin 100 MG capsule Commonly known as: NEURONTIN Take 1 capsule (100 mg total) by mouth 3 (three) times daily.   hydrocortisone 2.5 % rectal cream Commonly known as: ANUSOL-HC Place 1 application rectally 2 (two) times daily. As needed for rectal discomfort.   lisinopril 40 MG tablet Commonly known as: ZESTRIL Take 1 tablet (40 mg total)  by mouth daily.   metoprolol succinate 50 MG 24 hr tablet Commonly known as: Toprol XL Take 1 tablet (50 mg total) by mouth daily. Take with or immediately following a meal.   multivitamin tablet Take 1 tablet by mouth daily.   pantoprazole 40 MG tablet Commonly known as: PROTONIX Take 1 tablet (40 mg total) by mouth as needed. 30 minutes before breakfast   rosuvastatin 10 MG tablet Commonly known as: CRESTOR Take 1 tablet (10 mg total) by mouth daily.   rosuvastatin 5 MG tablet Commonly known as: CRESTOR Take 5 mg by mouth daily.         Objective:   BP 132/73   Pulse 85   Temp (!) 97 F (36.1 C) (Temporal)   Resp 20   Ht '5\' 4"'$  (1.626 m)   Wt 245 lb (111.1 kg)   SpO2 97%   BMI 42.05 kg/m   Wt  Readings from Last 3 Encounters:  01/10/21 245 lb (111.1 kg)  12/24/20 244 lb (110.7 kg)  11/01/20 249 lb (112.9 kg)    Physical Exam Vitals and nursing note reviewed.  Constitutional:      General: She is not in acute distress.    Appearance: She is well-developed. She is not diaphoretic.  Eyes:     Conjunctiva/sclera: Conjunctivae normal.  Cardiovascular:     Rate and Rhythm: Normal rate and regular rhythm.     Heart sounds: Normal heart sounds. No murmur heard. Pulmonary:     Effort: Pulmonary effort is normal. No respiratory distress.     Breath sounds: Normal breath sounds. No wheezing.  Musculoskeletal:        General: Normal range of motion.     Left wrist: Swelling and tenderness present. No deformity, effusion or bony tenderness.     Left knee: Crepitus present. No bony tenderness. Normal range of motion. Tenderness present over the medial joint line and lateral joint line. No LCL laxity, MCL laxity, ACL laxity or PCL laxity.Normal meniscus.  Skin:    General: Skin is warm and dry.     Findings: No rash.  Neurological:     Mental Status: She is alert and oriented to person, place, and time.     Coordination: Coordination normal.  Psychiatric:        Behavior: Behavior normal.  f    Assessment & Plan:   Problem List Items Addressed This Visit       Musculoskeletal and Integument   Osteoarthritis left knee   Relevant Orders   Ambulatory referral to Physical Therapy   Other Visit Diagnoses     Wrist sprain, left, initial encounter    -  Primary   Relevant Orders   Ambulatory referral to Physical Therapy   Fall, initial encounter       Relevant Orders   Ambulatory referral to Physical Therapy   Weakness of both lower extremities       Relevant Orders   Ambulatory referral to Physical Therapy       Will do referral to physical therapy to see if they can help get some strength and prevent future falls, also recommended aquatic therapy such as water  aerobics.  Patient has silver sneakers and they do this at the [facility]. Follow up plan: Return if symptoms worsen or fail to improve.  Counseling provided for all of the vaccine components Orders Placed This Encounter  Procedures   Ambulatory referral to Physical Therapy    Caryl Pina, MD [facility] 01/10/2021, 1:32 PM

## 2021-01-14 ENCOUNTER — Telehealth: Payer: Self-pay | Admitting: Family Medicine

## 2021-01-15 ENCOUNTER — Ambulatory Visit: Payer: Medicare Other | Admitting: Pharmacist

## 2021-01-15 DIAGNOSIS — N1832 Chronic kidney disease, stage 3b: Secondary | ICD-10-CM

## 2021-01-15 DIAGNOSIS — B0229 Other postherpetic nervous system involvement: Secondary | ICD-10-CM

## 2021-01-15 DIAGNOSIS — G8929 Other chronic pain: Secondary | ICD-10-CM

## 2021-01-15 DIAGNOSIS — I10 Essential (primary) hypertension: Secondary | ICD-10-CM

## 2021-01-16 ENCOUNTER — Ambulatory Visit: Payer: Medicare Other | Admitting: Family Medicine

## 2021-01-18 ENCOUNTER — Encounter: Payer: Self-pay | Admitting: Family Medicine

## 2021-01-18 ENCOUNTER — Ambulatory Visit (INDEPENDENT_AMBULATORY_CARE_PROVIDER_SITE_OTHER): Payer: Medicare Other | Admitting: Family Medicine

## 2021-01-18 ENCOUNTER — Other Ambulatory Visit: Payer: Self-pay

## 2021-01-18 VITALS — BP 139/63 | HR 72 | Ht 64.0 in | Wt 242.0 lb

## 2021-01-18 DIAGNOSIS — S63502D Unspecified sprain of left wrist, subsequent encounter: Secondary | ICD-10-CM

## 2021-01-18 DIAGNOSIS — M1712 Unilateral primary osteoarthritis, left knee: Secondary | ICD-10-CM

## 2021-01-18 NOTE — Progress Notes (Signed)
BP 139/63   Pulse 72   Ht '5\' 4"'$  (1.626 m)   Wt 242 lb (109.8 kg)   SpO2 97%   BMI 41.54 kg/m    Subjective:   Patient ID: Shelly [name], female    DOB: 1940/08/09, 80 y.o.   MRN: [id number]  HPI: Shelly [name] is a 80 y.o. female presenting on 01/18/2021 for Hand Pain (Left. Injured in fall about one week ago. Went to ER. Concussion r/o. Pt denies HA, N&V.)   HPI Patient is coming in for ER follow-up.  She was in the ER 1 week ago for a fall.  She says she tripped on something and fell and hurt her left knee and her left wrist and hit the side of her head.  In the emergency department they did a CT scan which was normal and they did an x-ray of her left wrist which was normal.  She says her left wrist was very sore but it is improving and she has been doing some stretches and exercises with it.  She says her headaches are no more and has not had them since that initial first day.  She says her left knee is still popping some and still has some swelling in it.  She has been using heat on that.  She is also been continuing to take her Voltaren gel and Tylenol.  She says things are improving.  She does have physical therapy set for next week.  Relevant past medical, surgical, family and social history reviewed and updated as indicated. Interim medical history since our last visit reviewed. Allergies and medications reviewed and updated.  Review of Systems  Constitutional:  Negative for chills and fever.  HENT:  Negative for congestion, ear discharge and ear pain.   Eyes:  Negative for visual disturbance.  Respiratory:  Negative for chest tightness and shortness of breath.   Cardiovascular:  Negative for chest pain and leg swelling.  Genitourinary:  Negative for difficulty urinating and dysuria.  Musculoskeletal:  Positive for arthralgias and joint swelling. Negative for back pain and gait problem.  Skin:  Negative for rash.  Neurological:  Negative for dizziness, weakness,  light-headedness, numbness and headaches.  Psychiatric/Behavioral:  Negative for agitation and behavioral problems.   All other systems reviewed and are negative.  Per HPI unless specifically indicated above   Allergies as of 01/18/2021       Reactions   Nsaids Other (See Comments)   AVOID all NSAID due to history of GI bleeding   Atorvastatin Other (See Comments)   Joint & muscle pain        Medication List        Accurate as of January 18, 2021  9:47 AM. If you have any questions, ask your nurse or doctor.          amLODipine 5 MG tablet Commonly known as: NORVASC Take 2.5 mg by mouth daily.   cetirizine 10 MG tablet Commonly known as: ZYRTEC Take 10 mg by mouth daily.   chlorthalidone 25 MG tablet Commonly known as: HYGROTON Take 1 tablet (25 mg total) by mouth daily.   clopidogrel 75 MG tablet Commonly known as: PLAVIX TAKE ONE TABLET BY MOUTH DAILY   clotrimazole-betamethasone cream Commonly known as: LOTRISONE APPLY AS DIRECTED TWICE DAILY.   diclofenac Sodium 1 % Gel Commonly known as: Voltaren Apply 2 g topically 4 (four) times daily.   furosemide 40 MG tablet Commonly known as: LASIX Take 40 mg by  mouth daily as needed (weight gain).   gabapentin 100 MG capsule Commonly known as: NEURONTIN Take 1 capsule (100 mg total) by mouth 3 (three) times daily.   hydrocortisone 2.5 % rectal cream Commonly known as: ANUSOL-HC Place 1 application rectally 2 (two) times daily. As needed for rectal discomfort.   lisinopril 40 MG tablet Commonly known as: ZESTRIL Take 1 tablet (40 mg total) by mouth daily.   metoprolol succinate 50 MG 24 hr tablet Commonly known as: Toprol XL Take 1 tablet (50 mg total) by mouth daily. Take with or immediately following a meal.   multivitamin tablet Take 1 tablet by mouth daily.   pantoprazole 40 MG tablet Commonly known as: PROTONIX Take 1 tablet (40 mg total) by mouth as needed. 30 minutes before breakfast    rosuvastatin 10 MG tablet Commonly known as: CRESTOR Take 1 tablet (10 mg total) by mouth daily.   rosuvastatin 5 MG tablet Commonly known as: CRESTOR Take 5 mg by mouth daily.         Objective:   BP 139/63   Pulse 72   Ht '5\' 4"'$  (1.626 m)   Wt 242 lb (109.8 kg)   SpO2 97%   BMI 41.54 kg/m   Wt Readings from Last 3 Encounters:  01/18/21 242 lb (109.8 kg)  01/10/21 245 lb (111.1 kg)  12/24/20 244 lb (110.7 kg)    Physical Exam Vitals and nursing note reviewed.  Constitutional:      General: She is not in acute distress.    Appearance: She is well-developed. She is not diaphoretic.  Eyes:     Conjunctiva/sclera: Conjunctivae normal.  Musculoskeletal:        General: Normal range of motion.     Left wrist: Tenderness present. No bony tenderness, snuff box tenderness or crepitus. Normal range of motion.     Left knee: Effusion and crepitus present. No bony tenderness. Normal range of motion. No tenderness. Normal alignment and normal meniscus.  Skin:    General: Skin is warm and dry.     Findings: No rash.  Neurological:     Mental Status: She is alert and oriented to person, place, and time.     Coordination: Coordination normal.  Psychiatric:        Behavior: Behavior normal.      Assessment & Plan:   Problem List Items Addressed This Visit       Musculoskeletal and Integument   Osteoarthritis left knee   Other Visit Diagnoses     Sprain of left wrist, subsequent encounter    -  Primary       Discussed that she probably has still some inflammation in her knee and to ice it as well as the heat.  Continue exercises.  She also has physical therapy to help her and hopefully prevent future falls.  Wrist is likely a sprain but improving, continue conservative management.  Patient starts physical therapy to prevent future falls next week Follow up plan: Return if symptoms worsen or fail to improve.  Counseling provided for all of the vaccine  components No orders of the defined types were placed in this encounter.   Caryl Pina, MD [facility] 01/18/2021, 9:47 AM

## 2021-01-22 ENCOUNTER — Ambulatory Visit: Payer: Medicare Other | Attending: Family Medicine | Admitting: Physical Therapy

## 2021-01-22 ENCOUNTER — Encounter: Payer: Self-pay | Admitting: Physical Therapy

## 2021-01-22 ENCOUNTER — Other Ambulatory Visit: Payer: Self-pay

## 2021-01-22 DIAGNOSIS — M6281 Muscle weakness (generalized): Secondary | ICD-10-CM | POA: Insufficient documentation

## 2021-01-22 DIAGNOSIS — M25532 Pain in left wrist: Secondary | ICD-10-CM | POA: Insufficient documentation

## 2021-01-22 NOTE — Therapy (Signed)
[facility] [place], [place], [address] Phone: [phone]   Fax:  [phone]  Physical Therapy Evaluation  Patient Details  Name: Shelly [name] MRN: [id number] Date of Birth: 1941/05/14 Referring Provider (PT): Caryl Pina MD   Encounter Date: 01/22/2021   PT End of Session - 01/22/21 1609     Visit Number 1    Number of Visits 12    Date for PT Re-Evaluation 03/19/21    PT Start Time 0155    PT Stop Time 0222    PT Time Calculation (min) 27 min    Activity Tolerance Patient tolerated treatment well    Behavior During Therapy [facility] for tasks assessed/performed             Past Medical History:  Diagnosis Date   Allergy    Anemia    GI bleed   Anginal pain ([place])    Anxiety    Arthritis    Blood transfusion without reported diagnosis    Cataract    Chronic kidney disease    Coronary artery disease    SEVERE   GERD (gastroesophageal reflux disease)    GI bleed 12/2013   Goiter    Hyperlipidemia    Hypertension    Myocardial infarct [facility])    Myocardial infarction ([place]) 08/2013   Neuralgia of right lower extremity 07/13/2017   Pancolonic diverticulosis 12/2013   Post herpetic neuralgia     Past Surgical History:  Procedure Laterality Date   CARPAL TUNNEL RELEASE Bilateral    COLONOSCOPY N/A 01/13/2014   Dr. Hung:pandiverticulosis    COLONOSCOPY N/A 12/14/2015   Dr. Michail Sermon: pancolonic diverticulosis, internal hemorrhoids    CORNEAL TRANSPLANT     CORONARY ANGIOPLASTY  08/2013   ESOPHAGOGASTRODUODENOSCOPY N/A 12/14/2015   Dr. Michail Sermon: normal    GIVENS CAPSULE STUDY N/A 08/13/2016   Dr. Oneida Alar: enteritis due to aspirin.    HEEL SPUR EXCISION     LEFT HEART CATHETERIZATION WITH CORONARY ANGIOGRAM N/A 08/23/2013   Procedure: LEFT HEART CATHETERIZATION WITH CORONARY ANGIOGRAM;  Surgeon: Peter M Martinique, MD;  Location: [facility] CATH LAB;  Service: Cardiovascular;  Laterality: N/A;   PERCUTANEOUS CORONARY  STENT INTERVENTION (PCI-S) N/A 08/24/2013   Procedure: PERCUTANEOUS CORONARY STENT INTERVENTION (PCI-S);  Surgeon: Peter M Martinique, MD;  Location: [facility] CATH LAB;  Service: Cardiovascular;  Laterality: N/A;   TONSILLECTOMY     TUBAL LIGATION      There were no vitals filed for this visit.    Subjective Assessment - 01/22/21 1558     Subjective COVID-19 screen performed prior to patient entering clinic.  The patient presents to the clinic today with c/o left wrist pain due to a fallon 01/03/21 when she tripped on a ledge going into her home.  X-rays taken were negative for fractures.  Her pain is rated at 4/10 today.  She also reports a PMH remarkable foe chronic left knee pain that she feels has been exacerbated bu the fall.  She is also having some right knee pain.  She states that around her home she walks without assistive device and will touch walls and furniture.  She is using a Rollator which is providing her with safe ambulation is is strongly advised that she use.    Pertinent History OA, CKD, CAD, HTN, MI, CTR.    Limitations Walking    How long can you walk comfortably? See above.    Diagnostic tests X-ray.    Patient Stated Goals Improve strength,  Currently in Pain? Yes    Pain Score 4     Pain Location Wrist    Pain Orientation Left    Pain Descriptors / Indicators Aching;Sore    Pain Type Acute pain    Pain Onset 1 to 4 weeks ago    Pain Frequency Constant    Aggravating Factors  Movement of left wrist.  Was wearing a brace but it hurt more.    Pain Relieving Factors rest.  Heat.                [facility] PT Assessment - 01/22/21 0001       Assessment   Medical Diagnosis Left wrist sprain, Weakness of LE's.    Referring Provider (PT) Vonna Kotyk [name] MD    Onset Date/Surgical Date 01/03/21      Precautions   Precautions Fall      Restrictions   Weight Bearing Restrictions No      Balance Screen   Has the patient fallen in the past 6 months Yes    How many times?  1.    Has the patient had a decrease in activity level because of a fear of falling?  No    Is the patient reluctant to leave their home because of a fear of falling?  No      Home Environment   Living Environment Private residence      Prior Function   Level of Independence Independent      Observation/Other Assessments-Edema    Edema Circumferential      Circumferential Edema   Circumferential - Left  LT 1 cm > RT at metatarsal break.      ROM / Strength   AROM / PROM / Strength AROM;Strength      AROM   Overall AROM Comments Full left forearm supination/pronation.  Active left wrist extesnion is 32 degrees and flexion is 42 degrees.  Functional bilateral LE range of motion.      Strength   Overall Strength Comments Right hip flexion is 4 to 4+/5, abduction is 4/5.  Left hip flexion and abduction is 4-/5.  Bilateral knee flexion and extension is 4 to 4+/5 with increased pain in left knee.      Palpation   Palpation comment Tender to palpation over dosrum of patient's left wrist over carpal row.      Transfers   Comments Sit to stand with definite use of armrests.      Ambulation/Gait   Gait Comments Gaited with patient in clinic today with bilateral HHA for better assessment.  She walks in trunk flexion and Trendelenburg gait pattern.  Recommeded she continue walk with her Rollator for safety.                        Objective measurements completed on examination: See above findings.             Upper Extremity Functional Index Score:  /80        PT Long Term Goals - 01/22/21 1756       PT LONG TERM GOAL #1   Title Independent with a HEP.    Time 6    Period Weeks    Status New      PT LONG TERM GOAL #2   Title Perform ADL's with left wrist pain not > 2-3/10.    Time 6    Period Weeks    Status New      PT  LONG TERM GOAL #3   Title Improve bilateral hip strength to 5/5 to mprove stability and eliminate Trendelenburg gait pattern.     Time 6    Period Weeks    Status New                    Plan - 01/22/21 1742     Clinical Impression Statement The patient presents to OPPT s/p fall on 01/03/21 after tripping on a ledge and falling on her knees and and outstretched left UE.  She c/o left wrist pain at the "break" in her wrist on the dorsal side and some minimal edema.  She has some loss of left wrist flexion and extension.  She has a h/o knee pain especially on the left and this has been exacerbated since her fall.  She has some LE weakness in especially in the hips.  She has a Trendelenburg gait pattern.  She presented to the clinic with a Rollator and it is strongly recommended that she continue to use for safety and allow for increased mobility.    Personal Factors and Comorbidities Fitness;Comorbidity 1;Comorbidity 2    Comorbidities OA, CKD, CAD, HTN, MI, CTR.    Examination-Activity Limitations Transfers;Other;Locomotion Level    Examination-Participation Restrictions Other;Yard Work    Biomedical scientist Low    Rehab Potential Good    PT Frequency 2x / week    PT Duration 6 weeks    PT Treatment/Interventions ADLs/Self Care Home Management;Cryotherapy;Electrical Stimulation;Ultrasound;Moist Heat;Gait training;Stair training;Functional mobility training;Therapeutic activities;Therapeutic exercise;Balance training;Neuromuscular re-education;Manual techniques;Patient/family education;Passive range of motion    PT Next Visit Plan Gentle range of motion to patient's left wrist, STW/M and modalites as needed.  Bilateral LE strengthening.  Nustep.  Gait and balance activities.    Consulted and Agree with Plan of Care Patient             Patient will benefit from skilled therapeutic intervention in order to improve the following deficits and impairments:  Abnormal gait, Difficulty walking, Decreased activity tolerance, Decreased  strength, Decreased range of motion, Pain  Visit Diagnosis: Pain in left wrist - Plan: PT plan of care cert/re-cert  Muscle weakness (generalized) - Plan: PT plan of care cert/re-cert     Problem List Patient Active Problem List   Diagnosis Date Noted   Subclinical hyperthyroidism 06/06/2019   Normocytic anemia 06/02/2019   Postherpetic neuralgia 09/28/2018   Herpes zoster without complication [id number]   Iron deficiency anemia 08/27/2016   Morbid obesity ([place]) 08/27/2016   CKD (chronic kidney disease), stage III ([place]) 04/04/2016   Osteoarthritis left knee 04/04/2016   Third degree heart block ([place]) 12/10/2015   Pancolonic diverticulosis 12/09/2015   CAD (coronary artery disease) 01/11/2014   History of non-ST elevation myocardial infarction (NSTEMI) 08/23/2013   IDA (iron deficiency anemia) 08/23/2013   Essential hypertension 08/22/2013   GERD (gastroesophageal reflux disease) 08/22/2013   Anxiety 08/22/2013    Yenifer Saccente, [place], PT 01/22/2021, 6:02 PM  [facility] [address] [place], [place], [address] Phone: [phone]   Fax:  [phone]  Name: Shelly [name] MRN: [id number] Date of Birth: 12/28/1940

## 2021-01-25 NOTE — Patient Instructions (Signed)
Visit Information  PATIENT GOALS:  Goals Addressed               This Visit's Progress     Patient Stated     Post-herpectic Neuralgia (pt-stated)        Current Barriers:  Unable to achieve control of post herpetic neuralgia   Pharmacist Clinical Goal(s):  Over the next 90 days, patient will achieve control of post herpetic neuralgia as evidenced by improved pain control & quality of life through collaboration with PharmD and provider.    Interventions: 1:1 collaboration with [name], [name], MD regarding development and update of comprehensive plan of care as evidenced by provider attestation and co-signature Inter-disciplinary care team collaboration (see longitudinal plan of care) Comprehensive medication review performed; medication list updated in electronic medical record  Post herpetic neuralgia: Uncontrolled; current treatment: gabapentin Patient reports pain/no improvement despite gabapentin.  It has been >1 yr post shingles. She reports difficulty sleeping due to pain Case discussed with LCSW who has been working with this client for the past year Collaborated with LCSW, PCP Considering additional gabapentin, TCAs, alternative therapies but will have to consider age/risks.  Patient/granddaughter to call back and discuss treatment options.   Patient Goals/Self-Care Activities Over the next 90 days, patient will:  - take medications as prescribed  Follow Up Plan: Telephone follow up appointment with care management team member scheduled for: 2 weeks         The patient verbalized understanding of instructions, educational materials, and care plan provided today and declined offer to receive copy of patient instructions, educational materials, and care plan.   Telephone follow up appointment with care management team member scheduled for:2 WEEKS  Signature Regina Eck, PharmD, BCPS Clinical Pharmacist, [place]  II Phone [phone]

## 2021-01-25 NOTE — Progress Notes (Signed)
Chronic Care Management Pharmacy Note  01/25/2021 Name:  Shelly [name] MRN:  [id number] DOB:  02/24/1941  Summary: post herpetic neuralgia  Recommendations/Changes made from today's visit:  Post herpetic neuralgia: Uncontrolled; current treatment: gabapentin Patient reports pain/no improvement despite gabapentin.  It has been >1 yr post shingles. She reports difficulty sleeping due to pain Case discussed with LCSW who has been working with this client for the past year Collaborated with LCSW, PCP Considering additional gabapentin, TCAs, alternative therapies but will have to consider age/risks.  Patient/granddaughter to call back and discuss treatment options.  Follow Up Plan: Telephone follow up appointment with care management team member scheduled for: 2 weeks  Subjective: Shelly [name] is an 80 y.o. year old female who is a primary patient of [name], [name], MD.  The CCM team was consulted for assistance with disease management and care coordination needs.    Collaboration with LCSW  for initial visit in response to provider referral for pharmacy case management and/or care coordination services.   Consent to Services:  The patient was given information about Chronic Care Management services, agreed to services, and gave verbal consent prior to initiation of services.  Please see initial visit note for detailed documentation.   Patient Care Team: [name], [name], MD as PCP - General (Family Medicine) Harl Bowie, Alphonse Guild, MD as PCP - Cardiology (Cardiology) Danie Binder, MD (Inactive) as Consulting Physician (Gastroenterology) Ilean China, RN as Case Manager Katha Cabal, LCSW as Social Worker (Licensed Clinical Social Worker)  Objective:  Lab Results  Component Value Date   CREATININE 1.60 (H) 01/03/2021   CREATININE 1.73 (H) 07/30/2020   CREATININE 1.67 (H) 04/30/2020    Lab Results  Component Value Date   HGBA1C 6.0 (H) 12/09/2015    Last diabetic Eye exam: No results found for: HMDIABEYEEXA  Last diabetic Foot exam: No results found for: HMDIABFOOTEX      Component Value Date/Time   CHOL 186 07/30/2020 1444   TRIG 113 07/30/2020 1444   HDL 48 07/30/2020 1444   CHOLHDL 3.9 07/30/2020 1444   CHOLHDL 2.6 11/20/2016 0854   VLDL 11 11/20/2016 0854   LDLCALC 118 (H) 07/30/2020 1444    Hepatic Function Latest Ref Rng & Units 07/30/2020 04/30/2020 01/31/2020  Total Protein 6.0 - 8.5 g/dL 7.4 6.7 6.8  Albumin 3.7 - 4.7 g/dL 4.1 3.6(L) 3.7  AST 0 - 40 IU/L $Remov'14 11 11  'EoZyZL$ ALT 0 - 32 IU/L $Remov'9 7 7  'nVaNNo$ Alk Phosphatase 44 - 121 IU/L 92 89 93  Total Bilirubin 0.0 - 1.2 mg/dL <0.2 <0.2 <0.2  Bilirubin, Direct 0.0 - 0.3 mg/dL - - -    Lab Results  Component Value Date/Time   TSH 0.161 (L) 10/16/2020 02:40 PM   TSH 0.227 (L) 07/04/2020 11:10 AM   FREET4 1.02 10/16/2020 02:40 PM   FREET4 1.00 07/04/2020 11:10 AM    CBC Latest Ref Rng & Units 01/03/2021 07/30/2020 04/30/2020  WBC 4.0 - 10.5 K/uL 5.7 6.5 7.1  Hemoglobin 12.0 - 15.0 g/dL 11.1(L) 10.8(L) 10.2(L)  Hematocrit 36.0 - 46.0 % 35.3(L) 33.6(L) 31.3(L)  Platelets 150 - 400 K/uL 308 278 270    Lab Results  Component Value Date/Time   VD25OH 36 11/20/2016 08:54 AM    Clinical ASCVD: Yes  The ASCVD Risk score (Arnett DK, et al., 2019) failed to calculate for the following reasons:   The patient has a prior MI or stroke diagnosis  Other: (CHADS2VASc if Afib, PHQ9 if depression, MMRC or CAT for COPD, ACT, DEXA)  Social History   Tobacco Use  Smoking Status Never  Smokeless Tobacco Never   BP Readings from Last 3 Encounters:  01/18/21 139/63  01/10/21 132/73  01/03/21 (!) 142/65   Pulse Readings from Last 3 Encounters:  01/18/21 72  01/10/21 85  01/03/21 63   Wt Readings from Last 3 Encounters:  01/18/21 242 lb (109.8 kg)  01/10/21 245 lb (111.1 kg)  12/24/20 244 lb (110.7 kg)    Assessment: Review of patient past medical history, allergies,  medications, health status, including review of consultants reports, laboratory and other test data, was performed as part of comprehensive evaluation and provision of chronic care management services.   SDOH:  (Social Determinants of Health) assessments and interventions performed:    CCM Care Plan  Allergies  Allergen Reactions   Nsaids Other (See Comments)    AVOID all NSAID due to history of GI bleeding   Atorvastatin Other (See Comments)    Joint & muscle pain    Medications Reviewed Today     Reviewed by Lavera Guise, [facility] (Pharmacist) on 01/25/21 at [place] List Status: <None>   Medication Order Taking? Sig Documenting Provider Last Dose Status Informant  amLODipine (NORVASC) 5 MG tablet [id number] No Take 2.5 mg by mouth daily. [provider] Taking Active Self  cetirizine (ZYRTEC) 10 MG tablet [id number] No Take 10 mg by mouth daily. [provider] Taking Active Self  chlorthalidone (HYGROTON) 25 MG tablet [id number] No Take 1 tablet (25 mg total) by mouth daily. [name], [name], MD Taking Active Self  clopidogrel (PLAVIX) 75 MG tablet [id number] No TAKE ONE TABLET BY MOUTH DAILY [name], [name], MD Taking Active Self  clotrimazole-betamethasone (LOTRISONE) cream [id number] No APPLY AS DIRECTED TWICE DAILY. [name], [name], MD Taking Active Self  diclofenac Sodium (VOLTAREN) 1 % GEL [id number] No Apply 2 g topically 4 (four) times daily. [name], [name], MD Taking Active Self  furosemide (LASIX) 40 MG tablet [id number] No Take 40 mg by mouth daily as needed (weight gain). [provider] Taking Active Self  gabapentin (NEURONTIN) 100 MG capsule [id number] No Take 1 capsule (100 mg total) by mouth 3 (three) times daily. [name], [name], MD Taking Active Self  hydrocortisone (ANUSOL-HC) 2.5 % rectal cream [id number] No Place 1 application rectally 2 (two) times daily. As needed for rectal discomfort. Annitta Needs, NP Taking Active Self   lisinopril (ZESTRIL) 40 MG tablet [id number] No Take 1 tablet (40 mg total) by mouth daily. [name], [name], MD Taking Active Self  metoprolol succinate (TOPROL XL) 50 MG 24 hr tablet [id number] No Take 1 tablet (50 mg total) by mouth daily. Take with or immediately following a meal. Verta Ellen., NP Taking Active Self  Multiple Vitamin (MULTIVITAMIN) tablet [id number] No Take 1 tablet by mouth daily. [provider] Taking Active Self  pantoprazole (PROTONIX) 40 MG tablet [id number] No Take 1 tablet (40 mg total) by mouth as needed. 30 minutes before breakfast Erenest Rasher, [place] Taking Active Self  rosuvastatin (CRESTOR) 10 MG tablet [id number] No Take 1 tablet (10 mg total) by mouth daily. Arnoldo Lenis, MD Taking Active Self  rosuvastatin (CRESTOR) 5 MG tablet [id number] No Take 5 mg by mouth daily. [provider] Taking Active Self            Patient Active Problem List   Diagnosis Date  Noted   Subclinical hyperthyroidism 06/06/2019   Normocytic anemia 06/02/2019   Postherpetic neuralgia 09/28/2018   Herpes zoster without complication 35/68/6168   Iron deficiency anemia 08/27/2016   Morbid obesity ([place]) 08/27/2016   CKD (chronic kidney disease), stage III ([place]) 04/04/2016   Osteoarthritis left knee 04/04/2016   Third degree heart block ([place]) 12/10/2015   Pancolonic diverticulosis 12/09/2015   CAD (coronary artery disease) 01/11/2014   History of non-ST elevation myocardial infarction (NSTEMI) 08/23/2013   IDA (iron deficiency anemia) 08/23/2013   Essential hypertension 08/22/2013   GERD (gastroesophageal reflux disease) 08/22/2013   Anxiety 08/22/2013    Immunization History  Administered Date(s) Administered   Pneumococcal Conjugate-13 10/02/2016   Pneumococcal Polysaccharide-23 08/25/2013   Zoster Recombinat (Shingrix) 03/17/2018    Conditions to be addressed/monitored: POST HERPETIC NEURALGIA  Care Plan : PHARMD MEDICATION  MANAGEMENT  Updates made by Lavera Guise, [place] since 01/25/2021 12:00 AM     Problem: DISEASE PROGRESSION PREVENTION      Long-Range Goal: CHRONIC PAIN S/P SHINGLES   Note:   Current Barriers:  Unable to achieve control of post herpetic neuralgia   Pharmacist Clinical Goal(s):  Over the next 90 days, patient will achieve control of post herpetic neuralgia as evidenced by improved pain control & quality of life  through collaboration with PharmD and provider.    Interventions: 1:1 collaboration with [name], [name], MD regarding development and update of comprehensive plan of care as evidenced by provider attestation and co-signature Inter-disciplinary care team collaboration (see longitudinal plan of care) Comprehensive medication review performed; medication list updated in electronic medical record  Post herpetic neuralgia: Uncontrolled; current treatment: gabapentin Patient reports pain/no improvement despite gabapentin.  It has been >1 yr post shingles. She reports difficulty sleeping due to pain Case discussed with LCSW who has been working with this client for the past year Collaborated with LCSW, PCP Considering additional gabapentin, TCAs, alternative therapies but will have to consider age/risks.  Patient/granddaughter to call back and discuss treatment options.   Patient Goals/Self-Care Activities Over the next 90 days, patient will:  - take medications as prescribed  Follow Up Plan: Telephone follow up appointment with care management team member scheduled for: 2 weeks      Medication Assistance: None required.  Patient affirms current coverage meets needs.  Patient's preferred pharmacy is:  [facility], [place] [address] Phone: [phone] Fax: [phone]   Plan: Telephone follow up appointment with care management team member scheduled for:  2 WEEKS   Regina Eck, PharmD,  BCPS Clinical Pharmacist, [place]  [facility] Phone [phone]

## 2021-01-29 ENCOUNTER — Encounter: Payer: Self-pay | Admitting: Physical Therapy

## 2021-01-29 ENCOUNTER — Other Ambulatory Visit: Payer: Self-pay | Admitting: Family Medicine

## 2021-01-29 ENCOUNTER — Ambulatory Visit: Payer: Medicare Other | Admitting: Physical Therapy

## 2021-01-29 ENCOUNTER — Other Ambulatory Visit: Payer: Self-pay

## 2021-01-29 DIAGNOSIS — M25532 Pain in left wrist: Secondary | ICD-10-CM | POA: Diagnosis not present

## 2021-01-29 DIAGNOSIS — M6281 Muscle weakness (generalized): Secondary | ICD-10-CM | POA: Diagnosis not present

## 2021-01-29 NOTE — Therapy (Signed)
[facility] [place], [place], [address] Phone: [phone]   Fax:  [phone]  Physical Therapy Treatment  Patient Details  Name: Shelly [name] MRN: [id number] Date of Birth: 1940-10-31 Referring Provider (PT): Caryl Pina MD   Encounter Date: 01/29/2021   PT End of Session - 01/29/21 1434     Visit Number 2    Number of Visits 12    Date for PT Re-Evaluation 03/19/21    PT Start Time [id number]    PT Stop Time 1513    PT Time Calculation (min) 41 min    Activity Tolerance Patient tolerated treatment well    Behavior During Therapy [facility] for tasks assessed/performed             Past Medical History:  Diagnosis Date   Allergy    Anemia    GI bleed   Anginal pain ([place])    Anxiety    Arthritis    Blood transfusion without reported diagnosis    Cataract    Chronic kidney disease    Coronary artery disease    SEVERE   GERD (gastroesophageal reflux disease)    GI bleed 12/2013   Goiter    Hyperlipidemia    Hypertension    Myocardial infarct [facility])    Myocardial infarction ([place]) 08/2013   Neuralgia of right lower extremity 07/13/2017   Pancolonic diverticulosis 12/2013   Post herpetic neuralgia     Past Surgical History:  Procedure Laterality Date   CARPAL TUNNEL RELEASE Bilateral    COLONOSCOPY N/A 01/13/2014   Dr. Hung:pandiverticulosis    COLONOSCOPY N/A 12/14/2015   Dr. Michail Sermon: pancolonic diverticulosis, internal hemorrhoids    CORNEAL TRANSPLANT     CORONARY ANGIOPLASTY  08/2013   ESOPHAGOGASTRODUODENOSCOPY N/A 12/14/2015   Dr. Michail Sermon: normal    GIVENS CAPSULE STUDY N/A 08/13/2016   Dr. Oneida Alar: enteritis due to aspirin.    HEEL SPUR EXCISION     LEFT HEART CATHETERIZATION WITH CORONARY ANGIOGRAM N/A 08/23/2013   Procedure: LEFT HEART CATHETERIZATION WITH CORONARY ANGIOGRAM;  Surgeon: Peter M Martinique, MD;  Location: [facility] CATH LAB;  Service: Cardiovascular;  Laterality: N/A;   PERCUTANEOUS CORONARY  STENT INTERVENTION (PCI-S) N/A 08/24/2013   Procedure: PERCUTANEOUS CORONARY STENT INTERVENTION (PCI-S);  Surgeon: Peter M Martinique, MD;  Location: [facility] CATH LAB;  Service: Cardiovascular;  Laterality: N/A;   TONSILLECTOMY     TUBAL LIGATION      There were no vitals filed for this visit.   Subjective Assessment - 01/29/21 1433     Subjective COVID-19 screen performed prior to patient entering clinic. Reports that she wanted to walk into clinic without walker as her knees feel okay but still having a lot of trouble with L wrist.    Pertinent History OA, CKD, CAD, HTN, MI, CTR.    Limitations Walking    How long can you walk comfortably? See above.    Diagnostic tests X-ray.    Patient Stated Goals Improve strength,    Currently in Pain? Yes    Pain Score --   No pain score provided   Pain Location Knee    Pain Orientation Left;Right    Pain Descriptors / Indicators Discomfort    Pain Type Acute pain    Pain Onset 1 to 4 weeks ago    Pain Frequency Constant    Multiple Pain Sites Yes    Pain Score --   no pain score provided   Pain Location Wrist  Pain Orientation Left    Pain Descriptors / Indicators Discomfort    Pain Type Acute pain    Pain Onset More than a month ago    Pain Frequency Constant                [facility] PT Assessment - 01/29/21 0001       Assessment   Medical Diagnosis Left wrist sprain, Weakness of LE's.    Referring Provider (PT) Caryl Pina MD    Onset Date/Surgical Date 01/03/21    Next MD Visit TBD      Precautions   Precautions Fall      Restrictions   Weight Bearing Restrictions No                           [facility] Adult PT Treatment/Exercise - 01/29/21 0001       Exercises   Exercises Knee/Hip;Wrist      Knee/Hip Exercises: Aerobic   Nustep L3 x11 min      Knee/Hip Exercises: Seated   Long Arc Quad Strengthening;Both;2 sets;10 reps;Weights    Long Arc Quad Weight 3 lbs.    Clamshell with TheraBand Red   x20 reps    Marching Strengthening;Both;20 reps;Limitations    Marching Limitations red theraband    Sit to [employer] 10 reps;with UE support   red theraband     Wrist Exercises   Wrist Extension AROM;Left;20 reps    Wrist Radial Deviation AROM;Right;20 reps    Wrist Ulnar Deviation AROM;Right;20 reps    Other wrist exercises L hand grasp, ball grasp, forearm pronate/supinate    Other wrist exercises L finger add/abduct                 Balance Exercises - 01/29/21 0001       Balance Exercises: Standing   Standing Eyes Opened Narrow base of support (BOS);Foam/compliant surface;Time    Standing Eyes Opened Time x3 min    Heel Raises Both;20 reps    Toe Raise Both;20 reps    Other Standing Exercises B toe taps alternating x20 reps with BUE                     PT Long Term Goals - 01/22/21 1756       PT LONG TERM GOAL #1   Title Independent with a HEP.    Time 6    Period Weeks    Status New      PT LONG TERM GOAL #2   Title Perform ADL's with left wrist pain not > 2-3/10.    Time 6    Period Weeks    Status New      PT LONG TERM GOAL #3   Title Improve bilateral hip strength to 5/5 to mprove stability and eliminate Trendelenburg gait pattern.    Time 6    Period Weeks    Status New                   Plan - 01/29/21 1526     Clinical Impression Statement Patient presented in clinic with reports of B knee pain as well as L wrist pain. Patient presented with notable swelling of L dorsal thumb and wrist. Patient continues experiencing greater pain with wrist extension, radial deviation, first and second finger pinch. Ball grasp also painful at head of L ulna. Patient requires UE support but is limited with L wrist due to pain with weightbearing.  Patient denies refusing use of L wrist as she tries to maintain with ADLs. Patient stands in trunk flexion and leans on an objects in front of her.    Personal Factors and Comorbidities Fitness;Comorbidity  1;Comorbidity 2    Comorbidities OA, CKD, CAD, HTN, MI, CTR.    Examination-Activity Limitations Transfers;Other;Locomotion Level    Examination-Participation Restrictions Other;Yard Work    [employer] Evolving/Moderate complexity    Rehab Potential Good    PT Frequency 2x / week    PT Duration 6 weeks    PT Treatment/Interventions ADLs/Self Care Home Management;Cryotherapy;Electrical Stimulation;Ultrasound;Moist Heat;Gait training;Stair training;Functional mobility training;Therapeutic activities;Therapeutic exercise;Balance training;Neuromuscular re-education;Manual techniques;Patient/family education;Passive range of motion    PT Next Visit Plan Gentle range of motion to patient's left wrist, STW/M and modalites as needed.  Bilateral LE strengthening.  Nustep.  Gait and balance activities.    Consulted and Agree with Plan of Care Patient             Patient will benefit from skilled therapeutic intervention in order to improve the following deficits and impairments:  Abnormal gait, Difficulty walking, Decreased activity tolerance, Decreased strength, Decreased range of motion, Pain  Visit Diagnosis: Pain in left wrist  Muscle weakness (generalized)     Problem List Patient Active Problem List   Diagnosis Date Noted   Subclinical hyperthyroidism 06/06/2019   Normocytic anemia 06/02/2019   Postherpetic neuralgia 09/28/2018   Herpes zoster without complication [id number]   Iron deficiency anemia 08/27/2016   Morbid obesity ([place]) 08/27/2016   CKD (chronic kidney disease), stage III ([place]) 04/04/2016   Osteoarthritis left knee 04/04/2016   Third degree heart block ([place]) 12/10/2015   Pancolonic diverticulosis 12/09/2015   CAD (coronary artery disease) 01/11/2014   History of non-ST elevation myocardial infarction (NSTEMI) 08/23/2013   IDA (iron deficiency anemia) 08/23/2013   Essential hypertension 08/22/2013   GERD (gastroesophageal reflux disease)  08/22/2013   Anxiety 08/22/2013    Standley Brooking, PTA 01/29/2021, 3:35 PM  [facility] [address] [place], [place], [address] Phone: [phone]   Fax:  [phone]  Name: KIZIAH PIN MRN: [id number] Date of Birth: 1940-10-14

## 2021-02-04 ENCOUNTER — Ambulatory Visit: Payer: Medicare Other | Admitting: Family Medicine

## 2021-02-05 ENCOUNTER — Ambulatory Visit: Payer: Medicare Other | Admitting: Physical Therapy

## 2021-02-05 ENCOUNTER — Other Ambulatory Visit: Payer: Self-pay

## 2021-02-05 ENCOUNTER — Encounter: Payer: Self-pay | Admitting: Physical Therapy

## 2021-02-05 DIAGNOSIS — M6281 Muscle weakness (generalized): Secondary | ICD-10-CM | POA: Diagnosis not present

## 2021-02-05 DIAGNOSIS — M25532 Pain in left wrist: Secondary | ICD-10-CM | POA: Diagnosis not present

## 2021-02-05 NOTE — Therapy (Signed)
[facility] [place], [place], [address] Phone: [phone]   Fax:  [phone]  Physical Therapy Treatment  Patient Details  Name: Shelly [name] MRN: [id number] Date of Birth: May 20, 1940 Referring Provider (PT): Caryl Pina MD   Encounter Date: 02/05/2021   PT End of Session - 02/05/21 1441     Visit Number 3    Number of Visits 12    Date for PT Re-Evaluation 03/19/21    PT Start Time [id number]    PT Stop Time 1514    PT Time Calculation (min) 40 min    Activity Tolerance Patient tolerated treatment well    Behavior During Therapy [facility] for tasks assessed/performed             Past Medical History:  Diagnosis Date   Allergy    Anemia    GI bleed   Anginal pain ([place])    Anxiety    Arthritis    Blood transfusion without reported diagnosis    Cataract    Chronic kidney disease    Coronary artery disease    SEVERE   GERD (gastroesophageal reflux disease)    GI bleed 12/2013   Goiter    Hyperlipidemia    Hypertension    Myocardial infarct [facility])    Myocardial infarction ([place]) 08/2013   Neuralgia of right lower extremity 07/13/2017   Pancolonic diverticulosis 12/2013   Post herpetic neuralgia     Past Surgical History:  Procedure Laterality Date   CARPAL TUNNEL RELEASE Bilateral    COLONOSCOPY N/A 01/13/2014   Dr. Hung:pandiverticulosis    COLONOSCOPY N/A 12/14/2015   Dr. Michail Sermon: pancolonic diverticulosis, internal hemorrhoids    CORNEAL TRANSPLANT     CORONARY ANGIOPLASTY  08/2013   ESOPHAGOGASTRODUODENOSCOPY N/A 12/14/2015   Dr. Michail Sermon: normal    GIVENS CAPSULE STUDY N/A 08/13/2016   Dr. Oneida Alar: enteritis due to aspirin.    HEEL SPUR EXCISION     LEFT HEART CATHETERIZATION WITH CORONARY ANGIOGRAM N/A 08/23/2013   Procedure: LEFT HEART CATHETERIZATION WITH CORONARY ANGIOGRAM;  Surgeon: Peter M Martinique, MD;  Location: [facility] CATH LAB;  Service: Cardiovascular;  Laterality: N/A;   PERCUTANEOUS CORONARY  STENT INTERVENTION (PCI-S) N/A 08/24/2013   Procedure: PERCUTANEOUS CORONARY STENT INTERVENTION (PCI-S);  Surgeon: Peter M Martinique, MD;  Location: [facility] CATH LAB;  Service: Cardiovascular;  Laterality: N/A;   TONSILLECTOMY     TUBAL LIGATION      There were no vitals filed for this visit.   Subjective Assessment - 02/05/21 1439     Subjective COVID-19 screen performed prior to patient entering clinic. Reports that she was unable to go to PCP appointment last week in which she was going to get them to look at her L wrist again.    Pertinent History OA, CKD, CAD, HTN, MI, CTR.    Limitations Walking    How long can you walk comfortably? See above.    Diagnostic tests X-ray.    Patient Stated Goals Improve strength,    Currently in Pain? Yes    Pain Score --   no rating provided   Pain Location Knee    Pain Orientation Left;Right    Pain Descriptors / Indicators Discomfort    Pain Type Acute pain    Pain Onset More than a month ago    Pain Frequency Constant    Pain Score --   no rating provided   Pain Location Wrist    Pain Orientation Left  Pain Descriptors / Indicators Discomfort    Pain Type Acute pain    Pain Onset More than a month ago    Pain Frequency Constant                [facility] PT Assessment - 02/05/21 0001       Assessment   Medical Diagnosis Left wrist sprain, Weakness of LE's.    Referring Provider (PT) Caryl Pina MD    Onset Date/Surgical Date 01/03/21    Next MD Visit TBD      Precautions   Precautions Bernerd Limbo Adult PT Treatment/Exercise - 02/05/21 0001       Exercises   Exercises Wrist;Hand;Knee/Hip      Knee/Hip Exercises: Aerobic   Nustep L3 x12 min      Knee/Hip Exercises: Seated   Long Arc Quad Strengthening;Both;2 sets;10 reps;Weights    Long Arc Quad Weight 3 lbs.    Clamshell with TheraBand Red   x20 reps   Hamstring Curl Strengthening;Both;2 sets;10 reps;Limitations    Hamstring  Limitations red theraband      Hand Exercises   Other Hand Exercises L hand web grip x2 min    Other Hand Exercises L red therabar twist x1 min      Wrist Exercises   Wrist Extension AROM;Left;20 reps    Other wrist exercises L finger add/abduct                          PT Long Term Goals - 01/22/21 1756       PT LONG TERM GOAL #1   Title Independent with a HEP.    Time 6    Period Weeks    Status New      PT LONG TERM GOAL #2   Title Perform ADL's with left wrist pain not > 2-3/10.    Time 6    Period Weeks    Status New      PT LONG TERM GOAL #3   Title Improve bilateral hip strength to 5/5 to mprove stability and eliminate Trendelenburg gait pattern.    Time 6    Period Weeks    Status New                   Plan - 02/05/21 1517     Clinical Impression Statement Patient presented in clinic with reports of continued knee pain as well as L wrist pain especially at distal ulnar region. Some inflammation and edema notable in L posterior wrist as well as over distal ulnar region. Patient continues to require increased attempt at sit <> stands due to chronic knee pain. Patient did report some burning of the L wrist with exercises.    Personal Factors and Comorbidities Fitness;Comorbidity 1;Comorbidity 2    Comorbidities OA, CKD, CAD, HTN, MI, CTR.    Examination-Activity Limitations Transfers;Other;Locomotion Level    Examination-Participation Restrictions Other;Yard Work    Merchant navy officer Evolving/Moderate complexity    Rehab Potential Good    PT Frequency 2x / week    PT Duration 6 weeks    PT Treatment/Interventions ADLs/Self Care Home Management;Cryotherapy;Electrical Stimulation;Ultrasound;Moist Heat;Gait training;Stair training;Functional mobility training;Therapeutic activities;Therapeutic exercise;Balance training;Neuromuscular re-education;Manual techniques;Patient/family education;Passive range of motion    PT Next  Visit Plan Gentle range of motion to patient's left wrist, STW/M  and modalites as needed.  Bilateral LE strengthening.  Nustep.  Gait and balance activities.    Consulted and Agree with Plan of Care Patient             Patient will benefit from skilled therapeutic intervention in order to improve the following deficits and impairments:  Abnormal gait, Difficulty walking, Decreased activity tolerance, Decreased strength, Decreased range of motion, Pain  Visit Diagnosis: Pain in left wrist  Muscle weakness (generalized)     Problem List Patient Active Problem List   Diagnosis Date Noted   Subclinical hyperthyroidism 06/06/2019   Normocytic anemia 06/02/2019   Postherpetic neuralgia 09/28/2018   Herpes zoster without complication 123XX123   Iron deficiency anemia 08/27/2016   Morbid obesity ([place]) 08/27/2016   CKD (chronic kidney disease), stage III ([place]) 04/04/2016   Osteoarthritis left knee 04/04/2016   Third degree heart block ([place]) 12/10/2015   Pancolonic diverticulosis 12/09/2015   CAD (coronary artery disease) 01/11/2014   History of non-ST elevation myocardial infarction (NSTEMI) 08/23/2013   IDA (iron deficiency anemia) 08/23/2013   Essential hypertension 08/22/2013   GERD (gastroesophageal reflux disease) 08/22/2013   Anxiety 08/22/2013    Standley Brooking, PTA 02/05/2021, 3:52 PM  [facility] [address] [place], [place], [address] Phone: [phone]   Fax:  [phone]  Name: Shelly [name] MRN: [id number] Date of Birth: 1940-07-29

## 2021-02-08 ENCOUNTER — Encounter: Payer: Self-pay | Admitting: Family Medicine

## 2021-02-08 ENCOUNTER — Ambulatory Visit (INDEPENDENT_AMBULATORY_CARE_PROVIDER_SITE_OTHER): Payer: Medicare Other | Admitting: Family Medicine

## 2021-02-08 ENCOUNTER — Other Ambulatory Visit: Payer: Self-pay

## 2021-02-08 ENCOUNTER — Ambulatory Visit (INDEPENDENT_AMBULATORY_CARE_PROVIDER_SITE_OTHER): Payer: Medicare Other | Admitting: Licensed Clinical Social Worker

## 2021-02-08 VITALS — BP 104/53 | HR 69 | Ht 64.0 in | Wt 240.0 lb

## 2021-02-08 DIAGNOSIS — E0590 Thyrotoxicosis, unspecified without thyrotoxic crisis or storm: Secondary | ICD-10-CM

## 2021-02-08 DIAGNOSIS — E782 Mixed hyperlipidemia: Secondary | ICD-10-CM

## 2021-02-08 DIAGNOSIS — N1832 Chronic kidney disease, stage 3b: Secondary | ICD-10-CM

## 2021-02-08 DIAGNOSIS — M1712 Unilateral primary osteoarthritis, left knee: Secondary | ICD-10-CM

## 2021-02-08 DIAGNOSIS — I10 Essential (primary) hypertension: Secondary | ICD-10-CM | POA: Diagnosis not present

## 2021-02-08 DIAGNOSIS — I2510 Atherosclerotic heart disease of native coronary artery without angina pectoris: Secondary | ICD-10-CM

## 2021-02-08 DIAGNOSIS — F419 Anxiety disorder, unspecified: Secondary | ICD-10-CM

## 2021-02-08 DIAGNOSIS — K219 Gastro-esophageal reflux disease without esophagitis: Secondary | ICD-10-CM

## 2021-02-08 DIAGNOSIS — D508 Other iron deficiency anemias: Secondary | ICD-10-CM

## 2021-02-08 NOTE — Chronic Care Management (AMB) (Signed)
Chronic Care Management    Clinical Social Work Note  02/08/2021 Name: Shelly [name] MRN: [id number] DOB: 04/20/1941  Shelly [name] is a 80 y.o. year old female who is a primary care patient of [name], [name], MD. The CCM team was consulted to assist the patient with chronic disease management and/or care coordination needs related to: [employer] .   Engaged with patient by telephone for follow up visit in response to provider referral for social work chronic care management and care coordination services.   Consent to Services:  The patient was given information about Chronic Care Management services, agreed to services, and gave verbal consent prior to initiation of services.  Please see initial visit note for detailed documentation.   Patient agreed to services and consent obtained.   Assessment: Review of patient past medical history, allergies, medications, and health status, including review of relevant consultants reports was performed today as part of a comprehensive evaluation and provision of chronic care management and care coordination services.     SDOH (Social Determinants of Health) assessments and interventions performed:  SDOH Interventions    Flowsheet Row Most Recent Value  SDOH Interventions   Stress Interventions Provide Counseling  [client has stress related to managing her medical needs. she uses a walker to walk, is getting PT at present, and has two granddaughters residing with her.  Client worries about the managing of her medical needs]  Depression Interventions/Treatment  Currently on Treatment        Advanced Directives Status: See Vynca application for related entries.  CCM Care Plan  Allergies  Allergen Reactions   Nsaids Other (See Comments)    AVOID all NSAID due to history of GI bleeding   Atorvastatin Other (See Comments)    Joint & muscle pain    Outpatient Encounter Medications as of 02/08/2021  Medication Sig    amLODipine (NORVASC) 5 MG tablet Take 2.5 mg by mouth daily.   cetirizine (ZYRTEC) 10 MG tablet Take 10 mg by mouth daily.   chlorthalidone (HYGROTON) 25 MG tablet Take 1 tablet (25 mg total) by mouth daily.   clopidogrel (PLAVIX) 75 MG tablet TAKE ONE TABLET BY MOUTH DAILY   clotrimazole-betamethasone (LOTRISONE) cream APPLY AS DIRECTED TWICE DAILY.   diclofenac Sodium (VOLTAREN) 1 % GEL Apply 2 g topically 4 (four) times daily.   furosemide (LASIX) 40 MG tablet Take 40 mg by mouth daily as needed (weight gain).   gabapentin (NEURONTIN) 100 MG capsule Take 1 capsule (100 mg total) by mouth 3 (three) times daily.   hydrocortisone (ANUSOL-HC) 2.5 % rectal cream Place 1 application rectally 2 (two) times daily. As needed for rectal discomfort.   lisinopril (ZESTRIL) 40 MG tablet Take 1 tablet (40 mg total) by mouth daily.   metoprolol succinate (TOPROL-XL) 50 MG 24 hr tablet TAKE ONE TABLET BY MOUTH DAILY *TAKE WITH OR IMMEDIATELY FOLLOWING A MEAL*   Multiple Vitamin (MULTIVITAMIN) tablet Take 1 tablet by mouth daily.   pantoprazole (PROTONIX) 40 MG tablet Take 1 tablet (40 mg total) by mouth as needed. 30 minutes before breakfast   rosuvastatin (CRESTOR) 10 MG tablet Take 1 tablet (10 mg total) by mouth daily.   rosuvastatin (CRESTOR) 5 MG tablet Take 5 mg by mouth daily.   No facility-administered encounter medications on file as of 02/08/2021.    Patient Active Problem List   Diagnosis Date Noted   Subclinical hyperthyroidism 06/06/2019   Normocytic anemia 06/02/2019   Postherpetic neuralgia 09/28/2018  Herpes zoster without complication 123XX123   Iron deficiency anemia 08/27/2016   Morbid obesity ([place]) 08/27/2016   CKD (chronic kidney disease), stage III ([place]) 04/04/2016   Osteoarthritis left knee 04/04/2016   Third degree heart block ([place]) 12/10/2015   Pancolonic diverticulosis 12/09/2015   CAD (coronary artery disease) 01/11/2014   History of non-ST elevation myocardial  infarction (NSTEMI) 08/23/2013   IDA (iron deficiency anemia) 08/23/2013   Essential hypertension 08/22/2013   GERD (gastroesophageal reflux disease) 08/22/2013   Anxiety 08/22/2013    Conditions to be addressed/monitored: monitor client management of anxiety and grief issues faced  Care Plan : LCSW Care Plan  Updates made by Katha Cabal, LCSW since 02/08/2021 12:00 AM     Problem: Depression Identification (Depression)      Goal: Depressive Symptoms Identified;Manage Depression symptoms and manage grief symptoms   Start Date: 01/01/2021  Expected End Date: 04/29/2021  This Visit's Progress: On track  Recent Progress: On track  Priority: Medium  Note:   Current Barriers:  Chronic Mental Health needs related to anxiey issues and grief issues Suicidal Ideation/Homicidal Ideation: No Depression issues Mobility issues  Clinical Social Work Goal(s):  patient will work with SW monthly by telephone or in person to reduce or manage symptoms related to anxiety and grief issues of client patient will attend all scheduled medical appointments as evidenced by patient report and care team review of appointment completion in EMR:   Patient will talk with Dr. Lottie Dawson, [facility] Pharmacist in next 30 days to discuss medication use of client and options for client related to managing Shingles  Interventions:  1:1 collaboration with Dr. [name] [name] regarding development and update of comprehensive plan of care as evidenced by provider attestation and co-signature Talked with client  about pain issues of client . She said she has knee pain occasionally. She said she has ongoing pain related to Shingles Talked with client about ambulation of client (client has a walker to use as needed) Talked with client  about upcoming medical appointments Talked with client about energy level of client. She said she has decreased energy Talked with client about family support (has daughter who is  supportive and has two granddaughters that are supportive) Encouraged client to call RNCM as needed for nursing support Talked with client about mood of client (client said her mood can go up and down; but sometimes it may be related to pain from Shingles. She said she feels that "When she wakes up, her Shingles affect her related to pain ; during day affect her, and until she goes to sleep, pain is present. She said sometimes she wakes up from her sleep because of pain related to Shingles" Provided counseling support for client Talked with client about transport needs of client . Client said she is considering using [facility] to transport her to and from her medical appointments. She said she has talked with Dr.[name] about [employer] support Talked with client about appetite. She said she has a decreased appetite. Talked with client about medication procurement for client.  Patient Self Care Activities:  Eats meals independently Takes medications as prescribed Attends scheduled medical appointments  Patient Coping Strengths:  Completes ADLs Completes IADLS Makes transport arrangements as needed Attends medical appointments Communicates with LCSW or RNCM as needed for CCM support  Patient Self Care Deficits:  Grief issues Depression issues  Patient Goals:  - spend time or talk with others every day - practice relaxation or meditation  daily - keep a calendar with appointment dates Talk with RNCM or LCSW as needed for support  Follow Up Plan: LCSW to call client on 03/27/21 at 2:30 PM to assess client needs      Norva Riffle.Tyjah Hai MSW, LCSW Licensed Clinical Social Worker [facility] Care Management [phone]

## 2021-02-08 NOTE — Patient Instructions (Signed)
Visit Information  PATIENT GOALS:  Goals Addressed               This Visit's Progress     Manage Grief issues faced; manage depression issues faced; Manage Emotions (pt-stated)        Timeframe:  Short-Term Goal Priority:  Medium Progress: On Track Start Date:  01/01/21                        Expected End Date: 04/29/21                     Follow Up Date  03/27/21 at 2:30 PM   Manage My Emotions (Patient) Manage Grief issues faced ; Manage depression issues faced   Why is this important?   When you are stressed, down or upset, your body reacts too.  For example, your blood pressure may get higher; you may have a headache or stomachache.  When your emotions get the best of you, your body's ability to fight off cold and flu gets weak.  These steps will help you manage your emotions.     Patient Self Care Activities:  Eats meals independently Takes medications as prescribed Attends scheduled medical appointments  Patient Coping Strengths:  Completes ADLs Completes IADLS Makes transport arrangements as needed Attends medical appointments Communicates with LCSW or RNCM as needed for CCM support  Patient Self Care Deficits:  Grief issues Depression issues  Patient Goals:  - spend time or talk with others every day - practice relaxation or meditation daily - keep a calendar with appointment dates Talk with RNCM or LCSW as needed for support  Follow Up Plan: LCSW to call client on 03/27/21 at 2:30 PM to assess client needs     Norva Riffle.Keenon Leitzel MSW, LCSW Licensed Clinical Social Worker [facility] Care Management [phone]

## 2021-02-08 NOTE — Progress Notes (Signed)
BP (!) 104/53   Pulse 69   Ht _0  (1.626 m)   Wt 240 lb (108.9 kg)   SpO2 99%   BMI 41.20 kg/m    Subjective:   Patient ID: Shelly [name], female    DOB: 1941/03/26, 80 y.o.   MRN: [id number]  HPI: Shelly [name] is a 80 y.o. female presenting on 02/08/2021 for Medical Management of Chronic Issues, Hypothyroidism, Hypertension, and Wrist Pain (Right)   HPI Hypertension Patient is currently on amlodipine and chlorthalidone and furosemide and lisinopril and metoprolol, and their blood pressure today is 104/53. Patient denies any lightheadedness or dizziness. Patient denies headaches, blurred vision, chest pains, shortness of breath, or weakness. Denies any side effects from medication and is content with current medication.   Hypothyroidism recheck Patient is coming in for thyroid recheck today as well. They deny any issues with hair changes or heat or cold problems or diarrhea or constipation. They deny any chest pain or palpitations. They are currently on no medication currently but has been borderline and we are monitoring  CKD stage III Patient is coming in for recheck of CKD stage III.  She also has CKD and sees cardiology.  Relevant past medical, surgical, family and social history reviewed and updated as indicated. Interim medical history since our last visit reviewed. Allergies and medications reviewed and updated.  Review of Systems  Constitutional:  Negative for chills and fever.  Eyes:  Negative for visual disturbance.  Respiratory:  Negative for chest tightness and shortness of breath.   Cardiovascular:  Negative for chest pain and leg swelling.  Genitourinary:  Negative for difficulty urinating and dysuria.  Musculoskeletal:  Positive for arthralgias (Arthritis in her hips). Negative for back pain and gait problem.  Skin:  Negative for rash.  Neurological:  Negative for dizziness, light-headedness and headaches.  Psychiatric/Behavioral:  Negative for  agitation and behavioral problems.   All other systems reviewed and are negative.  Per HPI unless specifically indicated above   Allergies as of 02/08/2021       Reactions   Nsaids Other (See Comments)   AVOID all NSAID due to history of GI bleeding   Atorvastatin Other (See Comments)   Joint & muscle pain        Medication List        Accurate as of February 08, 2021  1:51 PM. If you have any questions, ask your nurse or doctor.          amLODipine 5 MG tablet Commonly known as: NORVASC Take 2.5 mg by mouth daily.   cetirizine 10 MG tablet Commonly known as: ZYRTEC Take 10 mg by mouth daily.   chlorthalidone 25 MG tablet Commonly known as: HYGROTON Take 1 tablet (25 mg total) by mouth daily.   clopidogrel 75 MG tablet Commonly known as: PLAVIX TAKE ONE TABLET BY MOUTH DAILY   clotrimazole-betamethasone cream Commonly known as: LOTRISONE APPLY AS DIRECTED TWICE DAILY.   diclofenac Sodium 1 % Gel Commonly known as: Voltaren Apply 2 g topically 4 (four) times daily.   furosemide 40 MG tablet Commonly known as: LASIX Take 40 mg by mouth daily as needed (weight gain).   gabapentin 100 MG capsule Commonly known as: NEURONTIN Take 1 capsule (100 mg total) by mouth 3 (three) times daily.   hydrocortisone 2.5 % rectal cream Commonly known as: ANUSOL-HC Place 1 application rectally 2 (two) times daily. As needed for rectal discomfort.   lisinopril 40 MG tablet Commonly known  as: ZESTRIL Take 1 tablet (40 mg total) by mouth daily.   metoprolol succinate 50 MG 24 hr tablet Commonly known as: TOPROL-XL TAKE ONE TABLET BY MOUTH DAILY *TAKE WITH OR IMMEDIATELY FOLLOWING A MEAL*   multivitamin tablet Take 1 tablet by mouth daily.   pantoprazole 40 MG tablet Commonly known as: PROTONIX Take 1 tablet (40 mg total) by mouth as needed. 30 minutes before breakfast   rosuvastatin 10 MG tablet Commonly known as: CRESTOR Take 1 tablet (10 mg total) by mouth  daily.   rosuvastatin 5 MG tablet Commonly known as: CRESTOR Take 5 mg by mouth daily.         Objective:   BP (!) 104/53   Pulse 69   Ht _0  (1.626 m)   Wt 240 lb (108.9 kg)   SpO2 99%   BMI 41.20 kg/m   Wt Readings from Last 3 Encounters:  02/08/21 240 lb (108.9 kg)  01/18/21 242 lb (109.8 kg)  01/10/21 245 lb (111.1 kg)    Physical Exam Vitals and nursing note reviewed.  Constitutional:      General: She is not in acute distress.    Appearance: She is well-developed. She is not diaphoretic.  Eyes:     Conjunctiva/sclera: Conjunctivae normal.  Cardiovascular:     Rate and Rhythm: Normal rate and regular rhythm.     Heart sounds: Normal heart sounds. No murmur heard. Pulmonary:     Effort: Pulmonary effort is normal. No respiratory distress.     Breath sounds: Normal breath sounds. No wheezing.  Skin:    General: Skin is warm and dry.     Findings: No rash.  Neurological:     Mental Status: She is alert and oriented to person, place, and time.     Coordination: Coordination normal.  Psychiatric:        Behavior: Behavior normal.      Assessment & Plan:   Problem List Items Addressed This Visit       Cardiovascular and Mediastinum   Essential hypertension - Primary   Relevant Orders   CMP14+EGFR   Lipid panel   CBC with Differential/Platelet     Endocrine   Subclinical hyperthyroidism   Relevant Orders   TSH     Genitourinary   CKD (chronic kidney disease), stage III ([place])   Relevant Orders   CMP14+EGFR   CBC with Differential/Platelet    Continue current medication, no changes. Follow up plan: Return in about 3 months (around 05/10/2021), or if symptoms worsen or fail to improve, for Hypertension and thyroid recheck.  Counseling provided for all of the vaccine components Orders Placed This Encounter  Procedures   CMP14+EGFR   TSH   Lipid panel   CBC with Differential/Platelet    Caryl Pina, MD [place]  [facility] 02/08/2021, 1:51 PM

## 2021-02-09 LAB — CMP14+EGFR
ALT: 6 IU/L (ref 0–32)
AST: 10 IU/L (ref 0–40)
Albumin/Globulin Ratio: 1.3 (ref 1.2–2.2)
Albumin: 4 g/dL (ref 3.7–4.7)
Alkaline Phosphatase: 91 IU/L (ref 44–121)
BUN/Creatinine Ratio: 18 (ref 12–28)
BUN: 32 mg/dL — ABNORMAL HIGH (ref 8–27)
Bilirubin Total: <0.2 mg/dL (ref 0.0–1.2)
CO2: 20 mmol/L (ref 20–29)
Calcium: 9.7 mg/dL (ref 8.7–10.3)
Chloride: 109 mmol/L — ABNORMAL HIGH (ref 96–106)
Creatinine, Ser: 1.77 mg/dL — ABNORMAL HIGH (ref 0.57–1.00)
Globulin, Total: 3 g/dL (ref 1.5–4.5)
Glucose: 105 mg/dL — ABNORMAL HIGH (ref 65–99)
Potassium: 5 mmol/L (ref 3.5–5.2)
Sodium: 142 mmol/L (ref 134–144)
Total Protein: 7 g/dL (ref 6.0–8.5)
eGFR: 29 mL/min/{1.73_m2} — ABNORMAL LOW (ref >59)

## 2021-02-09 LAB — LIPID PANEL
Chol/HDL Ratio: 4 ratio (ref 0.0–4.4)
Cholesterol, Total: 166 mg/dL (ref 100–199)
HDL: 42 mg/dL (ref >39)
LDL Chol Calc (NIH): 97 mg/dL (ref 0–99)
Triglycerides: 155 mg/dL — ABNORMAL HIGH (ref 0–149)
VLDL Cholesterol Cal: 27 mg/dL (ref 5–40)

## 2021-02-09 LAB — CBC WITH DIFFERENTIAL/PLATELET
Basophils Absolute: 0 10*3/uL (ref 0.0–0.2)
Basos: 1 %
EOS (ABSOLUTE): 0.1 10*3/uL (ref 0.0–0.4)
Eos: 1 %
Hematocrit: 34.2 % (ref 34.0–46.6)
Hemoglobin: 10.8 g/dL — ABNORMAL LOW (ref 11.1–15.9)
Immature Grans (Abs): 0 10*3/uL (ref 0.0–0.1)
Immature Granulocytes: 0 %
Lymphocytes Absolute: 2.3 10*3/uL (ref 0.7–3.1)
Lymphs: 35 %
MCH: 29.8 pg (ref 26.6–33.0)
MCHC: 31.6 g/dL (ref 31.5–35.7)
MCV: 94 fL (ref 79–97)
Monocytes Absolute: 0.6 10*3/uL (ref 0.1–0.9)
Monocytes: 10 %
Neutrophils Absolute: 3.5 10*3/uL (ref 1.4–7.0)
Neutrophils: 53 %
Platelets: 308 10*3/uL (ref 150–450)
RBC: 3.63 x10E6/uL — ABNORMAL LOW (ref 3.77–5.28)
RDW: 13.5 % (ref 11.7–15.4)
WBC: 6.6 10*3/uL (ref 3.4–10.8)

## 2021-02-09 LAB — TSH: TSH: 0.12 u[IU]/mL — ABNORMAL LOW (ref 0.450–4.500)

## 2021-02-11 ENCOUNTER — Telehealth: Payer: Self-pay | Admitting: Family Medicine

## 2021-02-11 ENCOUNTER — Other Ambulatory Visit: Payer: Self-pay

## 2021-02-11 NOTE — Telephone Encounter (Signed)
Crestor '10mg'$  d/c from pts med list

## 2021-02-11 NOTE — Telephone Encounter (Signed)
Okay sounds good, take the other dose off and keep only the rosuvastatin 5 mg on her list

## 2021-02-12 ENCOUNTER — Other Ambulatory Visit: Payer: Self-pay

## 2021-02-12 ENCOUNTER — Ambulatory Visit: Payer: Medicare Other | Admitting: Physical Therapy

## 2021-02-12 ENCOUNTER — Encounter: Payer: Self-pay | Admitting: Physical Therapy

## 2021-02-12 DIAGNOSIS — M25532 Pain in left wrist: Secondary | ICD-10-CM

## 2021-02-12 DIAGNOSIS — M6281 Muscle weakness (generalized): Secondary | ICD-10-CM

## 2021-02-12 NOTE — Therapy (Signed)
[facility] [place], [place], [address] Phone: [phone]   Fax:  [phone]  Physical Therapy Treatment  Patient Details  Name: Shelly [name] MRN: [id number] Date of Birth: 1941/03/25 Referring Provider (PT): Caryl Pina MD   Encounter Date: 02/12/2021   PT End of Session - 02/12/21 1443     Visit Number 4    Number of Visits 12    Date for PT Re-Evaluation 03/19/21    PT Start Time [id number]    PT Stop Time [id number]    PT Time Calculation (min) 41 min    Activity Tolerance Patient tolerated treatment well    Behavior During Therapy [facility] for tasks assessed/performed             Past Medical History:  Diagnosis Date   Allergy    Anemia    GI bleed   Anginal pain ([place])    Anxiety    Arthritis    Blood transfusion without reported diagnosis    Cataract    Chronic kidney disease    Coronary artery disease    SEVERE   GERD (gastroesophageal reflux disease)    GI bleed 12/2013   Goiter    Hyperlipidemia    Hypertension    Myocardial infarct [facility])    Myocardial infarction ([place]) 08/2013   Neuralgia of right lower extremity 07/13/2017   Pancolonic diverticulosis 12/2013   Post herpetic neuralgia     Past Surgical History:  Procedure Laterality Date   CARPAL TUNNEL RELEASE Bilateral    COLONOSCOPY N/A 01/13/2014   Dr. Hung:pandiverticulosis    COLONOSCOPY N/A 12/14/2015   Dr. Michail Sermon: pancolonic diverticulosis, internal hemorrhoids    CORNEAL TRANSPLANT     CORONARY ANGIOPLASTY  08/2013   ESOPHAGOGASTRODUODENOSCOPY N/A 12/14/2015   Dr. Michail Sermon: normal    GIVENS CAPSULE STUDY N/A 08/13/2016   Dr. Oneida Alar: enteritis due to aspirin.    HEEL SPUR EXCISION     LEFT HEART CATHETERIZATION WITH CORONARY ANGIOGRAM N/A 08/23/2013   Procedure: LEFT HEART CATHETERIZATION WITH CORONARY ANGIOGRAM;  Surgeon: Peter M Martinique, MD;  Location[facility] CATH LAB;  Service: Cardiovascular;  Laterality: N/A;   PERCUTANEOUS CORONARY  STENT INTERVENTION (PCI-S) N/A 08/24/2013   Procedure: PERCUTANEOUS CORONARY STENT INTERVENTION (PCI-S);  Surgeon: Peter M Martinique, MD;  Location: [facility] CATH LAB;  Service: Cardiovascular;  Laterality: N/A;   TONSILLECTOMY     TUBAL LIGATION      There were no vitals filed for this visit.   Subjective Assessment - 02/12/21 1442     Subjective COVID-19 screen performed prior to patient entering clinic. Reports that the latest MD visit was medication check up but her wrist was not discussed.    Pertinent History OA, CKD, CAD, HTN, MI, CTR.    Limitations Walking    How long can you walk comfortably? See above.    Diagnostic tests X-ray.    Patient Stated Goals Improve strength,    Currently in Pain? Yes    Pain Score 8     Pain Location Knee    Pain Orientation Left;Right    Pain Descriptors / Indicators Discomfort    Pain Type Acute pain    Pain Onset More than a month ago    Pain Frequency Constant    Pain Score 9    Pain Location Wrist    Pain Orientation Left    Pain Descriptors / Indicators Burning    Pain Type Acute pain    Pain Onset More  than a month ago    Pain Frequency Constant                [facility] PT Assessment - 02/12/21 0001       Assessment   Medical Diagnosis Left wrist sprain, Weakness of LE's.    Referring Provider (PT) Caryl Pina MD    Onset Date/Surgical Date 01/03/21    Next MD Visit TBD      Precautions   Precautions Fall                           [place] Adult PT Treatment/Exercise - 02/12/21 0001       Knee/Hip Exercises: Aerobic   Nustep L3 x15 min      Knee/Hip Exercises: Standing   Heel Raises Both;20 reps    Heel Raises Limitations B toe raise x20 reps    Hip Abduction Stengthening;Both;2 sets;10 reps;Knee straight      Knee/Hip Exercises: Seated   Long Arc Quad Strengthening;Both;2 sets;10 reps;Weights    Long Arc Quad Weight 3 lbs.    Hamstring Curl Strengthening;Both;2 sets;10 reps;Limitations    Hamstring  Limitations red theraband      Hand Exercises   Digiticizer yellow x30 reps    Other Hand Exercises L hand web grip x2 min      Wrist Exercises   Wrist Flexion Strengthening;Left;20 reps;10 reps;Seated;Bar weights/barbell    Bar Weights/Barbell (Wrist Flexion) 1 lb    Wrist Extension Strengthening;Left;20 reps;10 reps;Seated;Bar weights/barbell    Bar Weights/Barbell (Wrist Extension) 1 lb    Other wrist exercises forearm supination/pronation 1# x20 reps                          PT Long Term Goals - 01/22/21 1756       PT LONG TERM GOAL #1   Title Independent with a HEP.    Time 6    Period Weeks    Status New      PT LONG TERM GOAL #2   Title Perform ADL's with left wrist pain not > 2-3/10.    Time 6    Period Weeks    Status New      PT LONG TERM GOAL #3   Title Improve bilateral hip strength to 5/5 to mprove stability and eliminate Trendelenburg gait pattern.    Time 6    Period Weeks    Status New                   Plan - 02/12/21 1810     Clinical Impression Statement Patient presented in clinic with continued high rating of pain for B knees and L wrist. Patient denied that wrist was assessed by MD at last PCP visit. Patient brought in rollator today for gait but did not use between exercises. Patient continues to be heavily dependent with UE support while in // bars for standing exercises. Patient also continues to report pain with wrist and hand exercises especially gripping.    Personal Factors and Comorbidities Fitness;Comorbidity 1;Comorbidity 2    Comorbidities OA, CKD, CAD, HTN, MI, CTR.    Examination-Activity Limitations Transfers;Other;Locomotion Level    Examination-Participation Restrictions Other;Yard Work    Merchant navy officer Evolving/Moderate complexity    Rehab Potential Good    PT Frequency 2x / week    PT Duration 6 weeks    PT Treatment/Interventions ADLs/Self Care Home Management;Cryotherapy;Electrical  Stimulation;Ultrasound;Moist Heat;Gait  training;Stair training;Functional mobility training;Therapeutic activities;Therapeutic exercise;Balance training;Neuromuscular re-education;Manual techniques;Patient/family education;Passive range of motion    PT Next Visit Plan Gentle range of motion to patient's left wrist, STW/M and modalites as needed.  Bilateral LE strengthening.  Nustep.  Gait and balance activities.    Consulted and Agree with Plan of Care Patient             Patient will benefit from skilled therapeutic intervention in order to improve the following deficits and impairments:  Abnormal gait, Difficulty walking, Decreased activity tolerance, Decreased strength, Decreased range of motion, Pain  Visit Diagnosis: Pain in left wrist  Muscle weakness (generalized)     Problem List Patient Active Problem List   Diagnosis Date Noted   Subclinical hyperthyroidism 06/06/2019   Normocytic anemia 06/02/2019   Postherpetic neuralgia 09/28/2018   Herpes zoster without complication [id number]   Iron deficiency anemia 08/27/2016   Morbid obesity ([place]) 08/27/2016   CKD (chronic kidney disease), stage III ([place]) 04/04/2016   Osteoarthritis left knee 04/04/2016   Third degree heart block ([place]) 12/10/2015   Pancolonic diverticulosis 12/09/2015   CAD (coronary artery disease) 01/11/2014   History of non-ST elevation myocardial infarction (NSTEMI) 08/23/2013   IDA (iron deficiency anemia) 08/23/2013   Essential hypertension 08/22/2013   GERD (gastroesophageal reflux disease) 08/22/2013   Anxiety 08/22/2013    Standley Brooking, PTA 02/12/2021, 6:13 PM  [facility] [address] [place], [place], [address] Phone: [phone]   Fax:  [phone]  Name: Shelly [name] MRN: [id number] Date of Birth: Feb 01, 1941

## 2021-02-15 DIAGNOSIS — I10 Essential (primary) hypertension: Secondary | ICD-10-CM

## 2021-02-15 DIAGNOSIS — D508 Other iron deficiency anemias: Secondary | ICD-10-CM | POA: Diagnosis not present

## 2021-02-15 DIAGNOSIS — I2510 Atherosclerotic heart disease of native coronary artery without angina pectoris: Secondary | ICD-10-CM | POA: Diagnosis not present

## 2021-02-15 DIAGNOSIS — M1712 Unilateral primary osteoarthritis, left knee: Secondary | ICD-10-CM

## 2021-02-15 DIAGNOSIS — E782 Mixed hyperlipidemia: Secondary | ICD-10-CM

## 2021-02-19 ENCOUNTER — Ambulatory Visit: Payer: Medicare Other | Admitting: Gastroenterology

## 2021-02-19 ENCOUNTER — Encounter: Payer: Self-pay | Admitting: Gastroenterology

## 2021-02-19 ENCOUNTER — Other Ambulatory Visit: Payer: Self-pay

## 2021-02-19 VITALS — BP 164/63 | HR 77 | Temp 97.3°F | Ht 64.0 in | Wt 242.4 lb

## 2021-02-19 DIAGNOSIS — K219 Gastro-esophageal reflux disease without esophagitis: Secondary | ICD-10-CM

## 2021-02-19 MED ORDER — HYDROCORTISONE (PERIANAL) 2.5 % EX CREA: 1.0000 "application " | TOPICAL_CREAM | Freq: Two times a day (BID) | CUTANEOUS | 1 refills | Status: DC

## 2021-02-19 NOTE — Progress Notes (Signed)
Referring Provider: [name], [name], MD Primary Care Physician:  [name], [name], MD Primary GI: Dr. Gala Romney   Chief Complaint  Patient presents with   Gastroesophageal Reflux    ok   Hemorrhoids    Occ, wants rx for hemorrhoid cream to use prn    HPI:   Shelly [name] is a 80 y.o. female presenting today with a history of  GERD, diverticular bleed several years ago, IDA with capsule study showing enteritis due to aspirin use, chronic GERD.Prefers frequent visits with health care team. Chronic normocytic anemia with chronic diease component.    Doing well today. Taking PPI only prn. No abdominal pain, N/V. No constipation, diarrhea, or rectal bleeding. She recently had a death in the family. Still dealing with post-herpetic neuralgia. Overall, her mood is better than last visit. She reports mild rectal itching at times. No bleeding.    Past Medical History:  Diagnosis Date   Allergy    Anemia    GI bleed   Anginal pain ([place])    Anxiety    Arthritis    Blood transfusion without reported diagnosis    Cataract    Chronic kidney disease    Coronary artery disease    SEVERE   GERD (gastroesophageal reflux disease)    GI bleed 12/2013   Goiter    Hyperlipidemia    Hypertension    Myocardial infarct [facility])    Myocardial infarction ([place]) 08/2013   Neuralgia of right lower extremity 07/13/2017   Pancolonic diverticulosis 12/2013   Post herpetic neuralgia     Past Surgical History:  Procedure Laterality Date   CARPAL TUNNEL RELEASE Bilateral    COLONOSCOPY N/A 01/13/2014   Dr. Hung:pandiverticulosis    COLONOSCOPY N/A 12/14/2015   Dr. Michail Sermon: pancolonic diverticulosis, internal hemorrhoids    CORNEAL TRANSPLANT     CORONARY ANGIOPLASTY  08/2013   ESOPHAGOGASTRODUODENOSCOPY N/A 12/14/2015   Dr. Michail Sermon: normal    GIVENS CAPSULE STUDY N/A 08/13/2016   Dr. Oneida Alar: enteritis due to aspirin.    HEEL SPUR EXCISION     LEFT HEART CATHETERIZATION WITH CORONARY  ANGIOGRAM N/A 08/23/2013   Procedure: LEFT HEART CATHETERIZATION WITH CORONARY ANGIOGRAM;  Surgeon: Peter M Martinique, MD;  Location: [facility] CATH LAB;  Service: Cardiovascular;  Laterality: N/A;   PERCUTANEOUS CORONARY STENT INTERVENTION (PCI-S) N/A 08/24/2013   Procedure: PERCUTANEOUS CORONARY STENT INTERVENTION (PCI-S);  Surgeon: Peter M Martinique, MD;  Location: [facility] CATH LAB;  Service: Cardiovascular;  Laterality: N/A;   TONSILLECTOMY     TUBAL LIGATION      Current Outpatient Medications  Medication Sig Dispense Refill   amLODipine (NORVASC) 5 MG tablet Take 2.5 mg by mouth daily.     cetirizine (ZYRTEC) 10 MG tablet Take 10 mg by mouth daily.     chlorthalidone (HYGROTON) 25 MG tablet Take 1 tablet (25 mg total) by mouth daily. 90 tablet 3   clopidogrel (PLAVIX) 75 MG tablet TAKE ONE TABLET BY MOUTH DAILY 90 tablet 0   clotrimazole-betamethasone (LOTRISONE) cream APPLY AS DIRECTED TWICE DAILY. 45 g 2   diclofenac Sodium (VOLTAREN) 1 % GEL Apply 2 g topically 4 (four) times daily. 350 g 3   furosemide (LASIX) 40 MG tablet Take 40 mg by mouth daily as needed (weight gain).     gabapentin (NEURONTIN) 100 MG capsule Take 1 capsule (100 mg total) by mouth 3 (three) times daily. 90 capsule 3   hydrocortisone (ANUSOL-HC) 2.5 % rectal cream Place 1 application rectally  2 (two) times daily. As needed for rectal discomfort. 30 g 1   lisinopril (ZESTRIL) 40 MG tablet Take 1 tablet (40 mg total) by mouth daily. 90 tablet 3   metoprolol succinate (TOPROL-XL) 50 MG 24 hr tablet TAKE ONE TABLET BY MOUTH DAILY *TAKE WITH OR IMMEDIATELY FOLLOWING A MEAL* 90 tablet 1   Multiple Vitamin (MULTIVITAMIN) tablet Take 1 tablet by mouth daily.     pantoprazole (PROTONIX) 40 MG tablet Take 1 tablet (40 mg total) by mouth as needed. 30 minutes before breakfast 90 tablet 2   rosuvastatin (CRESTOR) 5 MG tablet Take 5 mg by mouth daily.     No current facility-administered medications for this visit.    Allergies as of  02/19/2021 - Review Complete 02/19/2021  Allergen Reaction Noted   Nsaids Other (See Comments) 07/25/2016   Atorvastatin Other (See Comments) 11/30/2018    Family History  Problem Relation Age of Onset   Hypertension Brother    Transient ischemic attack Brother    Cancer Mother        unsure of type    Other Father        grangrene    Asthma Sister    Stroke Sister    COPD Sister    Stroke Sister    Early death Brother    Early death Brother    Liver cancer Daughter    Cancer Daughter    Hernia Son        Umbilical   GI problems Son    Pancreatic disease Daughter        pancreatectomy   Cancer Daughter    Diverticulitis Daughter    Arthritis Daughter        knee    Arthritis Daughter        shoulder    Anemia Daughter    GER disease Son    Colon cancer Neg Hx    Colon polyps Neg Hx     Social History   Socioeconomic History   Marital status: Divorced    Spouse name: Not on file   Number of children: 8   Years of education: 11   Highest education level: 11th grade  Occupational History   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   No daily caffeine use.   Social Determinants of Health   Financial Resource Strain: Low Risk    Difficulty of Paying Living Expenses: Not very hard  Food Insecurity: No Food Insecurity   Worried About Charity fundraiser in the Last Year: Never true   Ran Out of Food in the Last Year:  Never true  Transportation Needs: No Transportation Needs   Lack of Transportation (Medical): No   Lack of Transportation (Non-Medical): No  Physical Activity: Inactive   Days of Exercise per Week: 0 days   Minutes of  Exercise per Session: 0 min  Stress: Stress Concern Present   Feeling of Stress : Rather much  Social Connections: Socially Isolated   Frequency of Communication with Friends and Family: More than three times a week   Frequency of Social Gatherings with Friends and Family: More than three times a week   Attends Religious Services: Never   Marine scientist or Organizations: No   Attends Archivist Meetings: Never   Marital Status: Widowed    Review of Systems: Gen: Denies fever, chills, anorexia. Denies fatigue, weakness, weight loss.  CV: Denies chest pain, palpitations, syncope, peripheral edema, and claudication. Resp: Denies dyspnea at rest, cough, wheezing, coughing up blood, and pleurisy. GI: see HPI Derm: Denies rash, itching, dry skin Psych: Denies depression, anxiety, memory loss, confusion. No homicidal or suicidal ideation.  Heme: Denies bruising, bleeding, and enlarged lymph nodes.  Physical Exam: BP (!) 164/63   Pulse 77   Temp (!) 97.3 F (36.3 C) (Temporal)   Ht '5\' 4"'$  (1.626 m)   Wt 242 lb 6.4 oz (110 kg)   BMI 41.61 kg/m  General:   Alert and oriented. No distress noted. Pleasant and cooperative.  Head:  Normocephalic and atraumatic. Eyes:  Conjuctiva clear without scleral icterus. Mouth:  mask in place Abdomen:  +BS, soft, non-tender and non-distended. No rebound or guarding. No HSM or masses noted. Msk:  Symmetrical without gross deformities. Normal posture. Extremities:  Without edema. Neurologic:  Alert and  oriented x4 Psych:  Alert and cooperative. Normal mood and affect.  ASSESSMENT/PLAN: Shelly [name] is a 80 y.o. female presenting today with a history of  GERD, diverticular bleed several years ago, IDA with  capsule study showing enteritis due to aspirin use, chronic GERD. Prefers frequent visits with health care team. Chronic normocytic anemia with chronic diease component.   She has no concerns today. Continues PPI but only on a prn visit. She is doing quite well and could do yearly visits, but she desires to return every 3 months.  Will see her back in 3 months. Anusol cream sent to pharmacy as requested for minor rectal itching.   Annitta Needs, PhD, ANP-BC [facility]

## 2021-02-19 NOTE — Patient Instructions (Signed)
I have sent in Anusol cream to use just as needed.  I will see you in 3 months!  I enjoyed seeing you again today! As you know, I value our relationship and want to provide genuine, compassionate, and quality care. I welcome your feedback. If you receive a survey regarding your visit,  I greatly appreciate you taking time to fill this out. See you next time!  Annitta Needs, PhD, ANP-BC [facility]

## 2021-02-20 ENCOUNTER — Other Ambulatory Visit: Payer: Self-pay | Admitting: Family Medicine

## 2021-02-20 ENCOUNTER — Other Ambulatory Visit: Payer: Self-pay | Admitting: Cardiology

## 2021-02-20 ENCOUNTER — Telehealth: Payer: Self-pay

## 2021-02-20 DIAGNOSIS — B0229 Other postherpetic nervous system involvement: Secondary | ICD-10-CM

## 2021-02-20 DIAGNOSIS — I10 Essential (primary) hypertension: Secondary | ICD-10-CM

## 2021-02-20 DIAGNOSIS — N1832 Chronic kidney disease, stage 3b: Secondary | ICD-10-CM

## 2021-02-20 MED ORDER — ROSUVASTATIN CALCIUM 10 MG PO TABS: 10.0000 mg | ORAL_TABLET | Freq: Every day | ORAL | 3 refills | Status: DC

## 2021-02-20 NOTE — Telephone Encounter (Signed)
Crestor was to be switched to 10 mg daily per last OV note from Dr. Harl Bowie.

## 2021-02-21 ENCOUNTER — Encounter: Payer: Self-pay | Admitting: Physical Therapy

## 2021-02-21 ENCOUNTER — Other Ambulatory Visit: Payer: Self-pay

## 2021-02-21 ENCOUNTER — Ambulatory Visit: Payer: Medicare Other | Attending: Family Medicine | Admitting: Physical Therapy

## 2021-02-21 DIAGNOSIS — M25532 Pain in left wrist: Secondary | ICD-10-CM | POA: Insufficient documentation

## 2021-02-21 DIAGNOSIS — M6281 Muscle weakness (generalized): Secondary | ICD-10-CM | POA: Diagnosis not present

## 2021-02-21 NOTE — Therapy (Signed)
[facility] [place], [place], [address] Phone: [phone]   Fax:  [phone]  Physical Therapy Treatment  Patient Details  Name: Shelly [name] MRN: [id number] Date of Birth: [birth year] Referring Provider (PT): Caryl Pina MD   Encounter Date: 02/21/2021   PT End of Session - 02/21/21 1448     Visit Number 5    Number of Visits 12    Date for PT Re-Evaluation 03/19/21    PT Start Time [id number]    PT Stop Time [id number]    PT Time Calculation (min) 39 min    Activity Tolerance Patient tolerated treatment well    Behavior During Therapy [facility] for tasks assessed/performed             Past Medical History:  Diagnosis Date   Allergy    Anemia    GI bleed   Anginal pain ([place])    Anxiety    Arthritis    Blood transfusion without reported diagnosis    Cataract    Chronic kidney disease    Coronary artery disease    SEVERE   GERD (gastroesophageal reflux disease)    GI bleed 12/2013   Goiter    Hyperlipidemia    Hypertension    Myocardial infarct [facility])    Myocardial infarction ([place]) 08/2013   Neuralgia of right lower extremity 07/13/2017   Pancolonic diverticulosis 12/2013   Post herpetic neuralgia     Past Surgical History:  Procedure Laterality Date   CARPAL TUNNEL RELEASE Bilateral    COLONOSCOPY N/A 01/13/2014   Dr. Hung:pandiverticulosis    COLONOSCOPY N/A 12/14/2015   Dr. Michail Sermon: pancolonic diverticulosis, internal hemorrhoids    CORNEAL TRANSPLANT     CORONARY ANGIOPLASTY  08/2013   ESOPHAGOGASTRODUODENOSCOPY N/A 12/14/2015   Dr. Michail Sermon: normal    GIVENS CAPSULE STUDY N/A 08/13/2016   Dr. Oneida Alar: enteritis due to aspirin.    HEEL SPUR EXCISION     LEFT HEART CATHETERIZATION WITH CORONARY ANGIOGRAM N/A 08/23/2013   Procedure: LEFT HEART CATHETERIZATION WITH CORONARY ANGIOGRAM;  Surgeon: Peter M Martinique, MD;  Location: [facility] C[facility] LAB;  Service: Cardiovascular;  Laterality: N/A;   PERCUTANEOUS CORONARY  STENT INTERVENTION (PCI-S) N/A 08/24/2013   Procedure: PERCUTANEOUS CORONARY STENT INTERVENTION (PCI-S);  Surgeon: Peter M Martinique, MD;  Location: [facility] CATH LAB;  Service: Cardiovascular;  Laterality: N/A;   TONSILLECTOMY     TUBAL LIGATION      There were no vitals filed for this visit.   Subjective Assessment - 02/21/21 1439     Subjective COVID-19 screen performed prior to patient entering clinic. Reports that she tries to use her wrist as much as she can but denies any new falls.    Pertinent History OA, CKD, CAD, HTN, MI, CTR.    Limitations Walking    How long can you walk comfortably? See above.    Diagnostic tests X-ray.    Patient Stated Goals Improve strength,    Currently in Pain? Yes    Pain Score 6     Pain Location Knee    Pain Orientation Left;Right    Pain Descriptors / Indicators Discomfort    Pain Type Acute pain    Pain Onset More than a month ago    Pain Frequency Constant    Pain Score 8    Pain Location Wrist    Pain Orientation Left    Pain Descriptors / Indicators Discomfort    Pain Type Acute pain    Pain  Onset More than a month ago    Pain Frequency Constant                [facility] PT Assessment - 02/21/21 0001       Assessment   Medical Diagnosis Left wrist sprain, Weakness of LE's.    Referring Provider (PT) Caryl Pina MD    Onset Date/Surgical Date 01/03/21    Next MD Visit TBD      Precautions   Precautions Fall                           [facility] Adult PT Treatment/Exercise - 02/21/21 0001       Knee/Hip Exercises: Aerobic   Nustep L5 x11 min      Knee/Hip Exercises: Standing   Heel Raises Both;20 reps    Heel Raises Limitations B toe raise x20 reps    Forward Step Up Both;10 reps;Hand Hold: 2;Step Height: 4"      Knee/Hip Exercises: Seated   Long Arc Quad Strengthening;Both;3 sets;10 reps;Weights    Long Arc Quad Weight 4 lbs.    Hamstring Curl Strengthening;Both;2 sets;10 reps;Limitations    Hamstring  Limitations red theraband    Sit to Sand 10 reps;without UE support      Hand Exercises   Other Hand Exercises L hand small ball grip 5 sec holds x2 min      Wrist Exercises   Wrist Flexion Strengthening;Left;20 reps;10 reps;Seated;Bar weights/barbell    Bar Weights/Barbell (Wrist Flexion) 1 lb    Wrist Extension Strengthening;Left;20 reps;10 reps;Seated;Bar weights/barbell    Bar Weights/Barbell (Wrist Extension) 1 lb    Other wrist exercises forearm supination/pronation 1# x20 reps                          PT Long Term Goals - 01/22/21 1756       PT LONG TERM GOAL #1   Title Independent with a HEP.    Time 6    Period Weeks    Status New      PT LONG TERM GOAL #2   Title Perform ADL's with left wrist pain not > 2-3/10.    Time 6    Period Weeks    Status New      PT LONG TERM GOAL #3   Title Improve bilateral hip strength to 5/5 to mprove stability and eliminate Trendelenburg gait pattern.    Time 6    Period Weeks    Status New                   Plan - 02/21/21 1523     Clinical Impression Statement Patient presented in clinic with continued B knee pain and L wrist pain. Patient limited with standing capacity to due to knee discomfort and fatigue. Grinding of B knee noted with forward step ups. Patient able to tolerate some increased resistance throughout therex. Patient reported greater pain with ball gripping of L hand. Patient provided yellow theraputty to use at home for grip strengthening.    Personal Factors and Comorbidities Fitness;Comorbidity 1;Comorbidity 2    Comorbidities OA, CKD, CAD, HTN, MI, CTR.    Examination-Activity Limitations Transfers;Other;Locomotion Level    Examination-Participation Restrictions Other;Yard Work    Stability/Clinical Decision Making Evolving/Moderate complexity    Rehab Potential Good    PT Frequency 2x / week    PT Duration 6 weeks    PT Treatment/Interventions ADLs/Self  Care Home  Management;Cryotherapy;Electrical Stimulation;Ultrasound;Moist Heat;Gait training;Stair training;Functional mobility training;Therapeutic activities;Therapeutic exercise;Balance training;Neuromuscular re-education;Manual techniques;Patient/family education;Passive range of motion    PT Next Visit Plan Gentle range of motion to patient's left wrist, STW/M and modalites as needed.  Bilateral LE strengthening.  Nustep.  Gait and balance activities.    Consulted and Agree with Plan of Care Patient             Patient will benefit from skilled therapeutic intervention in order to improve the following deficits and impairments:  Abnormal gait, Difficulty walking, Decreased activity tolerance, Decreased strength, Decreased range of motion, Pain  Visit Diagnosis: Pain in left wrist  Muscle weakness (generalized)     Problem List Patient Active Problem List   Diagnosis Date Noted   Subclinical hyperthyroidism 06/06/2019   Normocytic anemia 06/02/2019   Postherpetic neuralgia 09/28/2018   Herpes zoster without complication [id number]   Iron deficiency anemia 08/27/2016   Morbid obesity ([place]) 08/27/2016   CKD (chronic kidney disease), stage III ([place]) 04/04/2016   Osteoarthritis left knee 04/04/2016   Third degree heart block ([place]) 12/10/2015   Pancolonic diverticulosis 12/09/2015   CAD (coronary artery disease) 01/11/2014   History of non-ST elevation myocardial infarction (NSTEMI) 08/23/2013   IDA (iron deficiency anemia) 08/23/2013   Essential hypertension 08/22/2013   GERD (gastroesophageal reflux disease) 08/22/2013   Anxiety 08/22/2013    Standley Brooking, PTA 02/21/2021, 4:04 PM  [facility] [address] [place], [place], [address] Phone: [phone]   Fax:  [phone]  Name: Shelly [name] MRN: [id number] Date of Birth: 02/05/1941

## 2021-02-28 ENCOUNTER — Ambulatory Visit (INDEPENDENT_AMBULATORY_CARE_PROVIDER_SITE_OTHER): Payer: Medicare Other | Admitting: Family

## 2021-02-28 ENCOUNTER — Ambulatory Visit: Payer: Medicare Other | Admitting: *Deleted

## 2021-02-28 ENCOUNTER — Other Ambulatory Visit: Payer: Self-pay

## 2021-02-28 ENCOUNTER — Encounter: Payer: Self-pay | Admitting: Family

## 2021-02-28 VITALS — BP 132/63 | HR 93 | Temp 97.3°F | Resp 20 | Ht 64.0 in | Wt 239.0 lb

## 2021-02-28 DIAGNOSIS — R21 Rash and other nonspecific skin eruption: Secondary | ICD-10-CM | POA: Diagnosis not present

## 2021-02-28 DIAGNOSIS — M25532 Pain in left wrist: Secondary | ICD-10-CM | POA: Diagnosis not present

## 2021-02-28 DIAGNOSIS — M6281 Muscle weakness (generalized): Secondary | ICD-10-CM | POA: Diagnosis not present

## 2021-02-28 DIAGNOSIS — L853 Xerosis cutis: Secondary | ICD-10-CM

## 2021-02-28 MED ORDER — TRIAMCINOLONE ACETONIDE 0.025 % EX OINT: 1.0000 "application " | TOPICAL_OINTMENT | Freq: Two times a day (BID) | CUTANEOUS | 1 refills | Status: DC

## 2021-02-28 NOTE — Patient Instructions (Signed)
Dry Skin Care  What causes dry skin?  Dry skin is common and results from inadequate moisture in the outer skin layers. Dry skin usually results from the excessive loss of moisture from the skin surface. This occurs due to two major factors: Normally the skin's oil glands deposit a layer of oil on the skin's surface. This layer of oil prevents the loss of moisture from the skin. Exposure to soaps, cleaners, solvents, and disinfectants removes this oily film, allowing water to escape. Water loss from the skin increases when the humidity is low. During winter months we spend a lot of time indoors where the air is heated. Heated air has very low humidity. This also contributes to dry skin.  A tendency for dry skin may accompany such disorders as eczema. Also, as people age, the number of functioning oil glands decreases, and the tendency toward dry skin can be a sensation of skin tightness when emerging from the shower.  How do I manage dry skin?  Humidify your environment. This can be accomplished by using a humidifier in your bedroom at night during winter months. Bathing can actually put moisture back into your skin if done right. Take the following steps while bathing to sooth dry skin: Avoid hot water, which only dries the skin and makes itching worse. Use warm water. Avoid washcloths or extensive rubbing or scrubbing. Use mild soaps like unscented Dove, Oil of Olay, Cetaphil, Basis, or CeraVe. If you take baths rather than showers, rinse off soap residue with clean water before getting out of tub. Once out of the shower/tub, pat dry gently with a soft towel. Leave your skin damp. While still damp, apply any medicated ointment/cream you were prescribed to the affected areas. After you apply your medicated ointment/cream, then apply your moisturizer to your whole body.This is the most important step in dry skin care. If this is omitted, your skin will continue to be dry. The choice of  moisturizer is also very important. In general, lotion will not provider enough moisture to severely dry skin because it is water based. You should use an ointment or cream. Moisturizers should also be unscented. Good choices include Vaseline (plain petrolatum), Aquaphor, Cetaphil, CeraVe, Vanicream, DML Forte, Aveeno moisture, or Eucerin Cream. Bath oils can be helpful, but do not replace the application of moisturizer after the bath. In addition, they make the tub slippery causing an increased risk for falls. Therefore, we do not recommend their use.

## 2021-02-28 NOTE — Progress Notes (Signed)
Subjective:    Patient ID: Shelly [name], female    DOB: 28-Sep-1940, 80 y.o.   MRN: [id number]  Chief Complaint  Patient presents with   Rash   Pt presents to the office today with rash on her face that has been there for months. She saw her PCP who gave her some Lotrisone cream to apply BID. She reports it helped slightly. She has used the entire bottle with little improvement.  Rash The current episode started more than 1 month ago. The problem is unchanged. The affected locations include the face. The rash is characterized by dryness, itchiness, peeling and scaling. Pertinent negatives include no congestion, cough, fatigue, fever, shortness of breath, sore throat or vomiting. Past treatments include anti-itch cream and oral steroids. The treatment provided mild relief.     Review of Systems  Constitutional:  Negative for fatigue and fever.  HENT:  Negative for congestion and sore throat.   Respiratory:  Negative for cough and shortness of breath.   Gastrointestinal:  Negative for vomiting.  Skin:  Positive for rash.  All other systems reviewed and are negative.     Objective:   Physical Exam Vitals reviewed.  Constitutional:      General: She is not in acute distress.    Appearance: She is well-developed.  HENT:     Head: Normocephalic and atraumatic.  Eyes:     Pupils: Pupils are equal, round, and reactive to light.  Neck:     Thyroid: No thyromegaly.  Cardiovascular:     Rate and Rhythm: Normal rate and regular rhythm.     Heart sounds: Normal heart sounds. No murmur heard. Pulmonary:     Effort: Pulmonary effort is normal. No respiratory distress.     Breath sounds: Normal breath sounds. No wheezing.  Abdominal:     General: Bowel sounds are normal. There is no distension.     Palpations: Abdomen is soft.     Tenderness: There is no abdominal tenderness.  Musculoskeletal:        General: No tenderness. Normal range of motion.     Cervical back: Normal range  of motion and neck supple.  Skin:    General: Skin is warm and dry.     Comments: Skin on forehead dry, no other rash noted   Neurological:     Mental Status: She is alert and oriented to person, place, and time.     Cranial Nerves: No cranial nerve deficit.     Deep Tendon Reflexes: Reflexes are normal and symmetric.  Psychiatric:        Behavior: Behavior normal.        Thought Content: Thought content normal.        Judgment: Judgment normal.    BP 132/63   Pulse (!) 0   Temp (!) 97.3 F (36.3 C) (Temporal)   Resp 20   Ht '5\' 4"'$  (1.626 m)   Wt 239 lb (108.4 kg)   SpO2 98%   BMI 41.02 kg/m        Assessment & Plan:  NIVIA ALPAUGH comes in today with chief complaint of Rash   Diagnosis and orders addressed:  1. Dry skin  - Ambulatory referral to Dermatology - triamcinolone (KENALOG) 0.025 % ointment; Apply 1 application topically 2 (two) times daily.  Dispense: 60 g; Refill: 1  2. Rash of face - Ambulatory referral to Dermatology - triamcinolone (KENALOG) 0.025 % ointment; Apply 1 application topically 2 (two) times daily.  Dispense: 60 g; Refill: 1  PT will start using moisturizer BID Pt is not happy about that, so I offered a referral to Dermatologists. I will give very low dose of triamcinolone ointment to apply when itching.     Evelina Dun, FNP

## 2021-02-28 NOTE — Therapy (Signed)
[facility] [place], [place], [address] Phone: [phone]   Fax:  [phone]  Physical Therapy Treatment  Patient Details  Name: Shelly [name] MRN: [id number] Date of Birth: Oct 12, 1940 Referring Provider (PT): Caryl Pina MD   Encounter Date: 02/28/2021   PT End of Session - 02/28/21 1455     Visit Number 6    Number of Visits 12    Date for PT Re-Evaluation 03/19/21    PT Start Time 1430    PT Stop Time 1520    PT Time Calculation (min) 50 min             Past Medical History:  Diagnosis Date   Allergy    Anemia    GI bleed   Anginal pain ([place])    Anxiety    Arthritis    Blood transfusion without reported diagnosis    Cataract    Chronic kidney disease    Coronary artery disease    SEVERE   GERD (gastroesophageal reflux disease)    GI bleed 12/2013   Goiter    Hyperlipidemia    Hypertension    Myocardial infarct [facility])    Myocardial infarction ([place]) 08/2013   Neuralgia of right lower extremity 07/13/2017   Pancolonic diverticulosis 12/2013   Post herpetic neuralgia     Past Surgical History:  Procedure Laterality Date   CARPAL TUNNEL RELEASE Bilateral    COLONOSCOPY N/A 01/13/2014   Dr. Hung:pandiverticulosis    COLONOSCOPY N/A 12/14/2015   Dr. Michail Sermon: pancolonic diverticulosis, internal hemorrhoids    CORNEAL TRANSPLANT     CORONARY ANGIOPLASTY  08/2013   ESOPHAGOGASTRODUODENOSCOPY N/A 12/14/2015   Dr. Michail Sermon: normal    GIVENS CAPSULE STUDY N/A 08/13/2016   Dr. Oneida Alar: enteritis due to aspirin.    HEEL SPUR EXCISION     LEFT HEART CATHETERIZATION WITH CORONARY ANGIOGRAM N/A 08/23/2013   Procedure: LEFT HEART CATHETERIZATION WITH CORONARY ANGIOGRAM;  Surgeon: Peter M Martinique, MD;  Location: [facility] CATH LAB;  Service: Cardiovascular;  Laterality: N/A;   PERCUTANEOUS CORONARY STENT INTERVENTION (PCI-S) N/A 08/24/2013   Procedure: PERCUTANEOUS CORONARY STENT INTERVENTION (PCI-S);  Surgeon:  Peter M Martinique, MD;  Location: [facility] CATH LAB;  Service: Cardiovascular;  Laterality: N/A;   TONSILLECTOMY     TUBAL LIGATION      There were no vitals filed for this visit.   Subjective Assessment - 02/28/21 1453     Subjective COVID-19 screen performed prior to patient entering clinic. Reports that she tries to use her wrist as much as she can. Did ok after last Rx    Pertinent History OA, CKD, CAD, HTN, MI, CTR.    Limitations Walking    How long can you walk comfortably? See above.    Diagnostic tests X-ray.    Patient Stated Goals Improve strength,    Currently in Pain? Yes    Pain Score 6     Pain Location Knee    Pain Orientation Right;Left    Pain Descriptors / Indicators Discomfort    Pain Type Acute pain    Pain Onset More than a month ago                               [facility] Adult PT Treatment/Exercise - 02/28/21 0001       Knee/Hip Exercises: Aerobic   Nustep L5 x12 min      Knee/Hip Exercises: Standing  Heel Raises Both;20 reps    Heel Raises Limitations B toe raise x20 reps    Forward Lunges Right;2 sets;10 reps   8in block work on quad control   Forward Step Up Both;10 reps;Hand Hold: 2;Step Height: 4"    Rocker Board 3 minutes      Knee/Hip Exercises: Seated   Long Arc Quad Strengthening;Both;3 sets;10 reps;Weights   hold 5 secs  at top   [employer] 4 lbs.    Clamshell with TheraBand Red   x30 reps band given for HEP   Hamstring Curl Strengthening;Both;2 sets;10 reps;Limitations    Hamstring Limitations red theraband    Sit to [employer] --                          PT Long Term Goals - 02/28/21 1555       PT LONG TERM GOAL #1   Title Independent with a HEP.    Time 6    Period Weeks    Status Partially Met      PT LONG TERM GOAL #2   Title Perform ADL's with left wrist pain not > 2-3/10.    Time 6    Period Weeks    Status On-going      PT LONG TERM GOAL #3   Title Improve bilateral hip strength to  5/5 to mprove stability and eliminate Trendelenburg gait pattern.    Time 6    Period Weeks    Status On-going                   Plan - 02/28/21 1456     Clinical Impression Statement Pt arrived today doing fair with LT wrist and LT knee Pt did better with standing exs today and was able to perform rocker board and lunge on 8 in step with focus on quad control. Discussed safe home exs in standing and sitting. Red tband given for seated clam shells. Pt reported that she is concerned about some of her meds due to feeling "foggy" at times and have  a fear of falling. Pt plans to discuss her concerns at next MD appt.    Personal Factors and Comorbidities Fitness;Comorbidity 1;Comorbidity 2    Comorbidities OA, CKD, CAD, HTN, MI, CTR.    Examination-Activity Limitations Transfers;Other;Locomotion Level    Examination-Participation Restrictions Other;Yard Work    [employer] officer Evolving/Moderate complexity    Rehab Potential Good    PT Frequency 2x / week    PT Duration 6 weeks    PT Treatment/Interventions ADLs/Self Care Home Management;Cryotherapy;Electrical Stimulation;Ultrasound;Moist Heat;Gait training;Stair training;Functional mobility training;Therapeutic activities;Therapeutic exercise;Balance training;Neuromuscular re-education;Manual techniques;Patient/family education;Passive range of motion    PT Next Visit Plan Gentle range of motion to patient's left wrist, STW/M and modalites as needed.  Bilateral LE strengthening.  Nustep.  Gait and balance activities.    Consulted and Agree with Plan of Care Patient             Patient will benefit from skilled therapeutic intervention in order to improve the following deficits and impairments:     Visit Diagnosis: Pain in left wrist  Muscle weakness (generalized)     Problem List Patient Active Problem List   Diagnosis Date Noted   Subclinical hyperthyroidism 06/06/2019   Normocytic anemia 06/02/2019    Postherpetic neuralgia 09/28/2018   Herpes zoster without complication 08/81/1031   Iron deficiency anemia 08/27/2016   Morbid obesity ([place]) 08/27/2016  CKD (chronic kidney disease), stage III ([place]) 04/04/2016   Osteoarthritis left knee 04/04/2016   Third degree heart block ([place]) 12/10/2015   Pancolonic diverticulosis 12/09/2015   CAD (coronary artery disease) 01/11/2014   History of non-ST elevation myocardial infarction (NSTEMI) 08/23/2013   IDA (iron deficiency anemia) 08/23/2013   Essential hypertension 08/22/2013   GERD (gastroesophageal reflux disease) 08/22/2013   Anxiety 08/22/2013    Shelly [name],Shelly [name], Shelly [name] 02/28/2021, 3:56 PM  [facility] [address] [place], [place], [address] Phone: [phone]   Fax:  [phone]  Name: Shelly [name] MRN: [id number] Date of Birth: [birth year]

## 2021-03-07 ENCOUNTER — Ambulatory Visit: Payer: Medicare Other

## 2021-03-14 ENCOUNTER — Other Ambulatory Visit: Payer: Self-pay

## 2021-03-14 ENCOUNTER — Ambulatory Visit: Payer: Medicare Other

## 2021-03-14 DIAGNOSIS — M25532 Pain in left wrist: Secondary | ICD-10-CM

## 2021-03-14 DIAGNOSIS — M6281 Muscle weakness (generalized): Secondary | ICD-10-CM

## 2021-03-14 NOTE — Therapy (Signed)
[facility] [place], [place], [address] Phone: [phone]   Fax:  [phone]  Physical Therapy Treatment  Patient Details  Name: Shelly [name] MRN: [id number] Date of Birth: 1940/07/01 Referring Provider (PT): Caryl Pina MD   Encounter Date: 03/14/2021   PT End of Session - 03/14/21 1439     Visit Number 7    Number of Visits 12    Date for PT Re-Evaluation 03/19/21    PT Start Time 4158    PT Stop Time 1519    PT Time Calculation (min) 45 min             Past Medical History:  Diagnosis Date   Allergy    Anemia    GI bleed   Anginal pain ([place])    Anxiety    Arthritis    Blood transfusion without reported diagnosis    Cataract    Chronic kidney disease    Coronary artery disease    SEVERE   GERD (gastroesophageal reflux disease)    GI bleed 12/2013   Goiter    Hyperlipidemia    Hypertension    Myocardial infarct [facility])    Myocardial infarction ([place]) 08/2013   Neuralgia of right lower extremity 07/13/2017   Pancolonic diverticulosis 12/2013   Post herpetic neuralgia     Past Surgical History:  Procedure Laterality Date   CARPAL TUNNEL RELEASE Bilateral    COLONOSCOPY N/A 01/13/2014   Dr. Hung:pandiverticulosis    COLONOSCOPY N/A 12/14/2015   Dr. Michail Sermon: pancolonic diverticulosis, internal hemorrhoids    CORNEAL TRANSPLANT     CORONARY ANGIOPLASTY  08/2013   ESOPHAGOGASTRODUODENOSCOPY N/A 12/14/2015   Dr. Michail Sermon: normal    GIVENS CAPSULE STUDY N/A 08/13/2016   Dr. Oneida Alar: enteritis due to aspirin.    HEEL SPUR EXCISION     LEFT HEART CATHETERIZATION WITH CORONARY ANGIOGRAM N/A 08/23/2013   Procedure: LEFT HEART CATHETERIZATION WITH CORONARY ANGIOGRAM;  Surgeon: Peter M Martinique, MD;  Location: [facility] CATH LAB;  Service: Cardiovascular;  Laterality: N/A;   PERCUTANEOUS CORONARY STENT INTERVENTION (PCI-S) N/A 08/24/2013   Procedure: PERCUTANEOUS CORONARY STENT INTERVENTION (PCI-S);  Surgeon:  Peter M Martinique, MD;  Location: [facility] CATH LAB;  Service: Cardiovascular;  Laterality: N/A;   TONSILLECTOMY     TUBAL LIGATION      There were no vitals filed for this visit.   Subjective Assessment - 03/14/21 1437     Subjective COVID-19 screen performed prior to patient entering clinic.  Pt reports 8/10 L wrist and 6/10 L knee pain upon arriving for today's treatment session.  Pt states that she put some cream on her knee prior to coming to treatment.    Pertinent History OA, CKD, CAD, HTN, MI, CTR.    Limitations Walking    How long can you walk comfortably? See above.    Diagnostic tests X-ray.    Patient Stated Goals Improve strength,    Currently in Pain? Yes    Pain Score 6     Pain Location Knee    Pain Orientation Left    Pain Onset More than a month ago                               [facility] Adult PT Treatment/Exercise - 03/14/21 0001       Knee/Hip Exercises: Aerobic   Nustep Lvl 5 x 11 mins      Knee/Hip Exercises: Seated  Long Arc [employer] Strengthening;Both;2 sets;10 reps;Weights    Long Arc [employer] 4 lbs.    Long [employer] Limitations cues for posture    [facility] 20 reps, cues for hold    Clamshell with TheraBand Red   20 reps, cues for eccentric control   Marching Strengthening;Both;Weights;1 set   12 reps   Marching Limitations cues for posture    Marching Weights 4 lbs.    Hamstring Curl Strengthening;Both;2 sets;10 reps    Hamstring Limitations red tband    Sit to Sand 2 sets;5 reps;with UE support   cues for eccentric control     Wrist Exercises   Wrist Flexion Strengthening;Left;20 reps;Bar weights/barbell    Bar Weights/Barbell (Wrist Flexion) 1 lb    Wrist Flexion Limitations Seated    Wrist Extension Strengthening;Left;20 reps;Bar weights/barbell    Bar Weights/Barbell (Wrist Extension) 1 lb    Wrist Extension Limitations Seated    Wrist Radial Deviation Strengthening;Left;20 reps;Bar weights/barbell;Seated    Bar  Weights/Barbell (Radial Deviation) 1 lb    Wrist Ulnar Deviation Strengthening;Left;20 reps;Seated;Bar weights/barbell    Bar Weights/Barbell (Ulnar Deviation) 1 lb    Other wrist exercises forearm pronation/supination 1# x 20 reps                          PT Long Term Goals - 02/28/21 1555       PT LONG TERM GOAL #1   Title Independent with a HEP.    Time 6    Period Weeks    Status Partially Met      PT LONG TERM GOAL #2   Title Perform ADL's with left wrist pain not > 2-3/10.    Time 6    Period Weeks    Status On-going      PT LONG TERM GOAL #3   Title Improve bilateral hip strength to 5/5 to mprove stability and eliminate Trendelenburg gait pattern.    Time 6    Period Weeks    Status On-going                   Plan - 03/14/21 1439     Clinical Impression Statement Pt arrived for treatment today reporting 8/10 left wrist pain and 6/10 left knee pain.  Pt only able to tolerate 11 mins on the nustep today before requesting to no longer continue.  Pt able to tolerate weight left wrist exercises with 1 # weights during today's session with min cues for technique radial and ulnar deviation.  Pt requiring cues for posture and to avoid posterior lean with LAQs and seated marches.  Pt has fear of falling and instructed in the importance of safety and using RW whenever able.  Pt able to perform multiple STS transfers with safe technique and good eccentric control.  Pt reported 7/10 left wrist pain and 5/10 left knee pain at completion of today's treatment session.  Pt would benefit form continuation of current POC to address limitation and assist with return to PLOF.    Personal Factors and Comorbidities Fitness;Comorbidity 1;Comorbidity 2    Comorbidities OA, CKD, CAD, HTN, MI, CTR.    Examination-Activity Limitations Transfers;Other;Locomotion Level    Examination-Participation Restrictions Other;Yard Work    Merchant navy officer  Evolving/Moderate complexity    Rehab Potential Good    PT Frequency 2x / week    PT Duration 6 weeks    PT Treatment/Interventions ADLs/Self Care Home Management;Cryotherapy;Electrical Stimulation;Ultrasound;Moist Heat;Gait training;Stair  training;Functional mobility training;Therapeutic activities;Therapeutic exercise;Balance training;Neuromuscular re-education;Manual techniques;Patient/family education;Passive range of motion    PT Next Visit Plan Gentle range of motion to patient's left wrist, STW/M and modalites as needed.  Bilateral LE strengthening.  Nustep.  Gait and balance activities.    Consulted and Agree with Plan of Care Patient             Patient will benefit from skilled therapeutic intervention in order to improve the following deficits and impairments:     Visit Diagnosis: Pain in left wrist  Muscle weakness (generalized)     Problem List Patient Active Problem List   Diagnosis Date Noted   Subclinical hyperthyroidism 06/06/2019   Normocytic anemia 06/02/2019   Postherpetic neuralgia 09/28/2018   Herpes zoster without complication 76/72/0947   Iron deficiency anemia 08/27/2016   Morbid obesity ([place]) 08/27/2016   CKD (chronic kidney disease), stage III ([place]) 04/04/2016   Osteoarthritis left knee 04/04/2016   Third degree heart block ([place]) 12/10/2015   Pancolonic diverticulosis 12/09/2015   CAD (coronary artery disease) 01/11/2014   History of non-ST elevation myocardial infarction (NSTEMI) 08/23/2013   IDA (iron deficiency anemia) 08/23/2013   Essential hypertension 08/22/2013   GERD (gastroesophageal reflux disease) 08/22/2013   Anxiety 08/22/2013    Kathrynn Ducking, PTA 03/14/2021, 3:24 PM  [facility] [address] [place], [place], [address] Phone: [phone]   Fax:  [phone]  Name: Shelly [name] MRN: [id number] Date of Birth: 05-13-1941

## 2021-03-15 ENCOUNTER — Telehealth: Payer: Medicare Other

## 2021-03-18 ENCOUNTER — Telehealth: Payer: Medicare Other | Admitting: *Deleted

## 2021-03-21 ENCOUNTER — Other Ambulatory Visit: Payer: Self-pay

## 2021-03-21 ENCOUNTER — Ambulatory Visit: Payer: Medicare Other | Attending: Family Medicine

## 2021-03-21 DIAGNOSIS — M25532 Pain in left wrist: Secondary | ICD-10-CM | POA: Insufficient documentation

## 2021-03-21 DIAGNOSIS — M6281 Muscle weakness (generalized): Secondary | ICD-10-CM | POA: Insufficient documentation

## 2021-03-21 NOTE — Therapy (Signed)
[facility] [place], [place], [address] Phone: [phone]   Fax:  [phone]  Physical Therapy Treatment  Patient Details  Name: Shelly [name] MRN: [id number] Date of Birth: 06-26-1940 Referring Provider (PT): Caryl Pina MD   Encounter Date: 03/21/2021   PT End of Session - 03/21/21 7353     Visit Number 8    Number of Visits 12    Date for PT Re-Evaluation 03/19/21    PT Start Time 2992    PT Stop Time 4268    PT Time Calculation (min) 36 min    Activity Tolerance Patient tolerated treatment well    Behavior During Therapy [facility] for tasks assessed/performed             Past Medical History:  Diagnosis Date   Allergy    Anemia    GI bleed   Anginal pain ([place])    Anxiety    Arthritis    Blood transfusion without reported diagnosis    Cataract    Chronic kidney disease    Coronary artery disease    SEVERE   GERD (gastroesophageal reflux disease)    GI bleed 12/2013   Goiter    Hyperlipidemia    Hypertension    Myocardial infarct [facility])    Myocardial infarction ([place]) 08/2013   Neuralgia of right lower extremity 07/13/2017   Pancolonic diverticulosis 12/2013   Post herpetic neuralgia     Past Surgical History:  Procedure Laterality Date   CARPAL TUNNEL RELEASE Bilateral    COLONOSCOPY N/A 01/13/2014   Dr. Hung:pandiverticulosis    COLONOSCOPY N/A 12/14/2015   Dr. Michail Sermon: pancolonic diverticulosis, internal hemorrhoids    CORNEAL TRANSPLANT     CORONARY ANGIOPLASTY  08/2013   ESOPHAGOGASTRODUODENOSCOPY N/A 12/14/2015   Dr. Michail Sermon: normal    GIVENS CAPSULE STUDY N/A 08/13/2016   Dr. Oneida Alar: enteritis due to aspirin.    HEEL SPUR EXCISION     LEFT HEART CATHETERIZATION WITH CORONARY ANGIOGRAM N/A 08/23/2013   Procedure: LEFT HEART CATHETERIZATION WITH CORONARY ANGIOGRAM;  Surgeon: Peter M Martinique, MD;  Location: [facility] CATH LAB;  Service: Cardiovascular;  Laterality: N/A;   PERCUTANEOUS CORONARY  STENT INTERVENTION (PCI-S) N/A 08/24/2013   Procedure: PERCUTANEOUS CORONARY STENT INTERVENTION (PCI-S);  Surgeon: Peter M Martinique, MD;  Location: [facility] CATH LAB;  Service: Cardiovascular;  Laterality: N/A;   TONSILLECTOMY     TUBAL LIGATION      There were no vitals filed for this visit.   Subjective Assessment - 03/21/21 1436     Subjective COVID-19 screen performed prior to patient entering clinic. Patient reports that her wrist is better (4/10), but her knee is getting her today (6/10). She notes that she walked around [place] three times. She notes that her knee has been sore and hurting since then.    Pertinent History OA, CKD, CAD, HTN, MI, CTR.    Limitations Walking    How long can you walk comfortably? See above.    Diagnostic tests X-ray.    Patient Stated Goals Improve strength,    Currently in Pain? Yes    Pain Score 6     Pain Location Knee    Pain Orientation Left    Pain Descriptors / Indicators Sore    Pain Type Chronic pain    Pain Onset More than a month ago    Pain Score 4    Pain Location Wrist    Pain Orientation Left  [facility] Adult PT Treatment/Exercise - 03/21/21 0001       Knee/Hip Exercises: Aerobic   Nustep L5 x 12 minutes   required rest breaks throughout     Knee/Hip Exercises: Seated   Long Arc Quad Strengthening;Both;2 sets;10 reps;Weights    Long Arc Quad Weight 5 lbs.      Manual Therapy   Manual Therapy Soft tissue mobilization    Soft tissue mobilization Leflt medial gastroc, joint line and distal hip adductors                 Balance Exercises - 03/21/21 0001       Balance Exercises: Standing   Marching Solid surface;Upper extremity assist 2;Static;20 reps                     PT Long Term Goals - 02/28/21 1555       PT LONG TERM GOAL #1   Title Independent with a HEP.    Time 6    Period Weeks    Status Partially Met      PT LONG TERM GOAL #2   Title Perform  ADL's with left wrist pain not > 2-3/10.    Time 6    Period Weeks    Status On-going      PT LONG TERM GOAL #3   Title Improve bilateral hip strength to 5/5 to mprove stability and eliminate Trendelenburg gait pattern.    Time 6    Period Weeks    Status On-going                   Plan - 03/21/21 1449     Clinical Impression Statement Patient presented to treatment with reduced wrist pain. Treatment then focused on her left knee with manual therapy being the most effective at reducing her pain. Manual therapy focused on soft tissue mobilization to the medial aspect of the left knee and the surrounding musculature. This was followed by familiar interventions for improved lower extremity strength. She required minimal cuing for upright stance with marching to facilitate proper biomechancs. She reported that her knee felt "somewhat better" upon the conclusion of treatment. She continues to require skilled physical therapy to safely maximize her functional mobility.    Personal Factors and Comorbidities Fitness;Comorbidity 1;Comorbidity 2    Comorbidities OA, CKD, CAD, HTN, MI, CTR.    Examination-Activity Limitations Transfers;Other;Locomotion Level    Examination-Participation Restrictions Other;Yard Work    Merchant navy officer Evolving/Moderate complexity    Rehab Potential Good    PT Frequency 2x / week    PT Duration 6 weeks    PT Treatment/Interventions ADLs/Self Care Home Management;Cryotherapy;Electrical Stimulation;Ultrasound;Moist Heat;Gait training;Stair training;Functional mobility training;Therapeutic activities;Therapeutic exercise;Balance training;Neuromuscular re-education;Manual techniques;Patient/family education;Passive range of motion    PT Next Visit Plan Gentle range of motion to patient's left wrist, STW/M and modalites as needed.  Bilateral LE strengthening.  Nustep.  Gait and balance activities.    Consulted and Agree with Plan of Care Patient              Patient will benefit from skilled therapeutic intervention in order to improve the following deficits and impairments:     Visit Diagnosis: Pain in left wrist  Muscle weakness (generalized)     Problem List Patient Active Problem List   Diagnosis Date Noted   Subclinical hyperthyroidism 06/06/2019   Normocytic anemia 06/02/2019   Postherpetic neuralgia 09/28/2018   Herpes zoster without complication 37/16/9678   Iron deficiency anemia  08/27/2016   Morbid obesity ([place]) 08/27/2016   CKD (chronic kidney disease), stage III ([place]) 04/04/2016   Osteoarthritis left knee 04/04/2016   Third degree heart block ([place]) 12/10/2015   Pancolonic diverticulosis 12/09/2015   CAD (coronary artery disease) 01/11/2014   History of non-ST elevation myocardial infarction (NSTEMI) 08/23/2013   IDA (iron deficiency anemia) 08/23/2013   Essential hypertension 08/22/2013   GERD (gastroesophageal reflux disease) 08/22/2013   Anxiety 08/22/2013    Darlin Coco, PT 03/21/2021, 4:09 PM  [facility] [address] [place], [place], [address] Phone: [phone]   Fax:  [phone]  Name: Shelly [name] MRN: [id number] Date of Birth: Oct 04, 1940

## 2021-03-25 ENCOUNTER — Other Ambulatory Visit: Payer: Self-pay | Admitting: Family Medicine

## 2021-03-25 DIAGNOSIS — B0229 Other postherpetic nervous system involvement: Secondary | ICD-10-CM

## 2021-03-27 ENCOUNTER — Ambulatory Visit (INDEPENDENT_AMBULATORY_CARE_PROVIDER_SITE_OTHER): Payer: Medicare Other | Admitting: Licensed Clinical Social Worker

## 2021-03-27 DIAGNOSIS — I252 Old myocardial infarction: Secondary | ICD-10-CM

## 2021-03-27 DIAGNOSIS — N1832 Chronic kidney disease, stage 3b: Secondary | ICD-10-CM

## 2021-03-27 DIAGNOSIS — E782 Mixed hyperlipidemia: Secondary | ICD-10-CM

## 2021-03-27 DIAGNOSIS — F419 Anxiety disorder, unspecified: Secondary | ICD-10-CM

## 2021-03-27 DIAGNOSIS — I2510 Atherosclerotic heart disease of native coronary artery without angina pectoris: Secondary | ICD-10-CM

## 2021-03-27 DIAGNOSIS — M1712 Unilateral primary osteoarthritis, left knee: Secondary | ICD-10-CM

## 2021-03-27 DIAGNOSIS — I10 Essential (primary) hypertension: Secondary | ICD-10-CM

## 2021-03-27 DIAGNOSIS — K219 Gastro-esophageal reflux disease without esophagitis: Secondary | ICD-10-CM

## 2021-03-27 DIAGNOSIS — D508 Other iron deficiency anemias: Secondary | ICD-10-CM

## 2021-03-27 NOTE — Patient Instructions (Addendum)
Visit Information  Patient goals:  Manage My emotions (Patient). Manage Grief issues faced. Manage  depression issues faced.  Timeframe:  Short-Term Goal Priority:  Medium Progress: On Track Start Date:  03/27/21                        Expected End Date: 06/24/21                     Follow Up Date  05/27/21 at 3:00 PM   Manage My Emotions (Patient) Manage Grief issues faced ; Manage depression issues faced   Why is this important?   When you are stressed, down or upset, your body reacts too.  For example, your blood pressure may get higher; you may have a headache or stomachache.  When your emotions get the best of you, your body's ability to fight off cold and flu gets weak.  These steps will help you manage your emotions.     Patient Self Care Activities:  Eats meals independently Takes medications as prescribed Attends scheduled medical appointments  Patient Coping Strengths:  Completes ADLs Completes IADLS Makes transport arrangements as needed Attends medical appointments Communicates with LCSW or RNCM as needed for CCM support  Patient Self Care Deficits:  Grief issues Depression issues  Patient Goals:  - spend time or talk with others every day - practice relaxation or meditation daily - keep a calendar with appointment dates Talk with RNCM or LCSW as needed for support  Follow Up Plan: LCSW to call client on 05/27/21 at 3:00 PM to assess client needs   Norva Riffle.Joelie Schou MSW, LCSW Licensed Clinical Social Worker [facility] Care Management [phone]

## 2021-03-27 NOTE — Chronic Care Management (AMB) (Signed)
Chronic Care Management    Clinical Social Work Note  03/27/2021 Name: Shelly [name] MRN: [id number] DOB: 04/30/1941  Shelly [name] is a 80 y.o. year old female who is a primary care patient of [name], [name], MD. The CCM team was consulted to assist the patient with chronic disease management and/or care coordination needs related to: [employer] .   Engaged with patient by telephone for follow up visit in response to provider referral for social work chronic care management and care coordination services.   Consent to Services:  The patient was given information about Chronic Care Management services, agreed to services, and gave verbal consent prior to initiation of services.  Please see initial visit note for detailed documentation.   Patient agreed to services and consent obtained.   Assessment: Review of patient past medical history, allergies, medications, and health status, including review of relevant consultants reports was performed today as part of a comprehensive evaluation and provision of chronic care management and care coordination services.     SDOH (Social Determinants of Health) assessments and interventions performed:  SDOH Interventions    Flowsheet Row Most Recent Value  SDOH Interventions   Physical Activity Interventions Other (Comments)  [client has walking challenges . she uses a walker to help her walk]  Stress Interventions Provide Counseling  [client has stress realted to managing medical needs]  Depression Interventions/Treatment  Currently on Treatment        Advanced Directives Status: See Vynca application for related entries.  CCM Care Plan  Allergies  Allergen Reactions   Nsaids Other (See Comments)    AVOID all NSAID due to history of GI bleeding   Atorvastatin Other (See Comments)    Joint & muscle pain    Outpatient Encounter Medications as of 03/27/2021  Medication Sig   amLODipine (NORVASC) 5 MG tablet TAKE ONE  TABLET BY MOUTH ONCE DAILY   cetirizine (ZYRTEC) 10 MG tablet Take 10 mg by mouth daily.   chlorthalidone (HYGROTON) 25 MG tablet TAKE ONE TABLET BY MOUTH DAILY   clopidogrel (PLAVIX) 75 MG tablet TAKE ONE TABLET BY MOUTH DAILY   diclofenac Sodium (VOLTAREN) 1 % GEL Apply 2 g topically 4 (four) times daily.   furosemide (LASIX) 40 MG tablet Take 40 mg by mouth daily as needed (weight gain).   gabapentin (NEURONTIN) 100 MG capsule TAKE ONE CAPSULE BY MOUTH THREE TIMES DAILY   hydrocortisone (ANUSOL-HC) 2.5 % rectal cream Place 1 application rectally 2 (two) times daily. As needed for rectal discomfort.   lisinopril (ZESTRIL) 40 MG tablet TAKE ONE TABLET BY MOUTH DAILY   metoprolol succinate (TOPROL-XL) 50 MG 24 hr tablet TAKE ONE TABLET BY MOUTH DAILY *TAKE WITH OR IMMEDIATELY FOLLOWING A MEAL*   Multiple Vitamin (MULTIVITAMIN) tablet Take 1 tablet by mouth daily.   pantoprazole (PROTONIX) 40 MG tablet Take 1 tablet (40 mg total) by mouth as needed. 30 minutes before breakfast   rosuvastatin (CRESTOR) 10 MG tablet Take 1 tablet (10 mg total) by mouth daily.   triamcinolone (KENALOG) 0.025 % ointment Apply 1 application topically 2 (two) times daily.   No facility-administered encounter medications on file as of 03/27/2021.    Patient Active Problem List   Diagnosis Date Noted   Subclinical hyperthyroidism 06/06/2019   Normocytic anemia 06/02/2019   Postherpetic neuralgia 09/28/2018   Herpes zoster without complication 95/28/4132   Iron deficiency anemia 08/27/2016   Morbid obesity ([place]) 08/27/2016   CKD (chronic kidney disease), stage III (  [place]) 04/04/2016   Osteoarthritis left knee 04/04/2016   Third degree heart block ([place]) 12/10/2015   Pancolonic diverticulosis 12/09/2015   CAD (coronary artery disease) 01/11/2014   History of non-ST elevation myocardial infarction (NSTEMI) 08/23/2013   IDA (iron deficiency anemia) 08/23/2013   Essential hypertension 08/22/2013   GERD  (gastroesophageal reflux disease) 08/22/2013   Anxiety 08/22/2013    Conditions to be addressed/monitored: monitor client management of anxiety issues and of grief issues  Care Plan : LCSW Care Plan  Updates made by Katha Cabal, LCSW since 03/27/2021 12:00 AM     Problem: Depression Identification (Depression)      Goal: Depressive Symptoms Identified;Manage Depression symptoms and manage grief symptoms   Start Date: 03/27/2021  Expected End Date: 06/24/2021  This Visit's Progress: On track  Recent Progress: On track  Priority: Medium  Note:   Current Barriers:  Chronic Mental Health needs related to anxiey issues and grief issues Suicidal Ideation/Homicidal Ideation: No Depression issues Mobility issues  Clinical Social Work Goal(s):  patient will work with SW monthly by telephone or in person to reduce or manage symptoms related to anxiety and grief issues of client patient will attend all scheduled medical appointments as evidenced by patient report and care team review of appointment completion in EMR:   Patient will talk with Dr. Lottie Dawson, [facility] Pharmacist in next 30 days to discuss medication use of client and options for client related to managing Shingles  Interventions:  1:1 collaboration with Dr. [name] [name] regarding development and update of comprehensive plan of care as evidenced by provider attestation and co-signature Discussed knee pain issues with client. She said she has knee pain occasionally. She said she has ongoing pain related to Shingles Discussed ambulation of client (client has a walker to use as needed) Discussed physical therapy sessions received by client. She said she had been to 3 physical therapy sessions. She also said she does exercises at home to help her with mobility.  Discussed with client her phone challenges. She said sometimes her phone is not working well and will cut off when she is talking with callers.  Patient Self Care  Activities:  Eats meals independently Takes medications as prescribed Attends scheduled medical appointments  Patient Coping Strengths:  Completes ADLs Completes IADLS Makes transport arrangements as needed Attends medical appointments Communicates with LCSW or RNCM as needed for CCM support  Patient Self Care Deficits:  Grief issues Depression issues  Patient Goals:  - spend time or talk with others every day - practice relaxation or meditation daily - keep a calendar with appointment dates Talk with RNCM or LCSW as needed for support  Follow Up Plan: LCSW to call client on  05/27/21 at 3:00 PM to assess client needs      Norva Riffle.Shristi Scheib MSW, LCSW Licensed Clinical Social Worker [facility] Care Management [phone]

## 2021-03-28 ENCOUNTER — Ambulatory Visit: Payer: Medicare Other

## 2021-04-02 DIAGNOSIS — E0590 Thyrotoxicosis, unspecified without thyrotoxic crisis or storm: Secondary | ICD-10-CM | POA: Diagnosis not present

## 2021-04-03 LAB — TSH: TSH: 0.224 u[IU]/mL — ABNORMAL LOW (ref 0.450–4.500)

## 2021-04-03 LAB — T3, FREE: T3, Free: 2.4 pg/mL (ref 2.0–4.4)

## 2021-04-03 LAB — T4, FREE: Free T4: 0.88 ng/dL (ref 0.82–1.77)

## 2021-04-04 ENCOUNTER — Ambulatory Visit: Payer: Medicare Other

## 2021-04-04 ENCOUNTER — Other Ambulatory Visit: Payer: Self-pay

## 2021-04-04 DIAGNOSIS — M25532 Pain in left wrist: Secondary | ICD-10-CM

## 2021-04-04 DIAGNOSIS — M6281 Muscle weakness (generalized): Secondary | ICD-10-CM | POA: Diagnosis not present

## 2021-04-04 NOTE — Therapy (Signed)
[facility] [place], [place], [address] Phone: [phone]   Fax:  [phone]  Physical Therapy Treatment  Patient Details  Name: Shelly [name] MRN: [id number] Date of Birth: 06-Jun-1940 Referring Provider (PT): Caryl Pina MD   Encounter Date: 04/04/2021   PT End of Session - 04/04/21 1457     Visit Number 9    Number of Visits 12    Date for PT Re-Evaluation 03/19/21    PT Start Time 1430    PT Stop Time 1520    PT Time Calculation (min) 50 min    Activity Tolerance Patient tolerated treatment well    Behavior During Therapy [facility] for tasks assessed/performed             Past Medical History:  Diagnosis Date   Allergy    Anemia    GI bleed   Anginal pain ([place])    Anxiety    Arthritis    Blood transfusion without reported diagnosis    Cataract    Chronic kidney disease    Coronary artery disease    SEVERE   GERD (gastroesophageal reflux disease)    GI bleed 12/2013   Goiter    Hyperlipidemia    Hypertension    Myocardial infarct [facility])    Myocardial infarction ([place]) 08/2013   Neuralgia of right lower extremity 07/13/2017   Pancolonic diverticulosis 12/2013   Post herpetic neuralgia     Past Surgical History:  Procedure Laterality Date   CARPAL TUNNEL RELEASE Bilateral    COLONOSCOPY N/A 01/13/2014   Dr. Hung:pandiverticulosis    COLONOSCOPY N/A 12/14/2015   Dr. Michail Sermon: pancolonic diverticulosis, internal hemorrhoids    CORNEAL TRANSPLANT     CORONARY ANGIOPLASTY  08/2013   ESOPHAGOGASTRODUODENOSCOPY N/A 12/14/2015   Dr. Michail Sermon: normal    GIVENS CAPSULE STUDY N/A 08/13/2016   Dr. Oneida Alar: enteritis due to aspirin.    HEEL SPUR EXCISION     LEFT HEART CATHETERIZATION WITH CORONARY ANGIOGRAM N/A 08/23/2013   Procedure: LEFT HEART CATHETERIZATION WITH CORONARY ANGIOGRAM;  Surgeon: Peter M Martinique, MD;  Location: [facility] CATH LAB;  Service: Cardiovascular;  Laterality: N/A;   PERCUTANEOUS CORONARY  STENT INTERVENTION (PCI-S) N/A 08/24/2013   Procedure: PERCUTANEOUS CORONARY STENT INTERVENTION (PCI-S);  Surgeon: Peter M Martinique, MD;  Location: [facility] CATH LAB;  Service: Cardiovascular;  Laterality: N/A;   TONSILLECTOMY     TUBAL LIGATION      There were no vitals filed for this visit.   Subjective Assessment - 04/04/21 1435     Subjective COVID-19 screen performed prior to patient entering clinic. Patient reports that the back of her knee is sore from doing her HEP today. However, she notes her knee has not been sore since her last appointment.    Pertinent History OA, CKD, CAD, HTN, MI, CTR.    Limitations Walking    How long can you walk comfortably? See above.    Diagnostic tests X-ray.    Patient Stated Goals Improve strength,    Currently in Pain? Yes    Pain Score 6     Pain Location Knee    Pain Orientation Posterior    Pain Descriptors / Indicators Sore    Pain Onset More than a month ago                               [facility] Adult PT Treatment/Exercise - 04/04/21 0001  Exercises   Exercises Lumbar      Lumbar Exercises: Supine   Large Ball Abdominal Isometric 20 reps;3 seconds    Large Ball Abdominal Isometric Limitations with cuing for upright posture      Knee/Hip Exercises: Aerobic   Nustep L5 x 15 minutes      Knee/Hip Exercises: Standing   Hip Extension Both;20 reps;Knee straight      Knee/Hip Exercises: Seated   Long Arc Quad Strengthening;Both;3 sets;10 reps;Weights    Long Arc Quad Weight 5 lbs.    Other Seated Knee/Hip Exercises Heel/toe raises   24 reps                         PT Long Term Goals - 02/28/21 1555       PT LONG TERM GOAL #1   Title Independent with a HEP.    Time 6    Period Weeks    Status Partially Met      PT LONG TERM GOAL #2   Title Perform ADL's with left wrist pain not > 2-3/10.    Time 6    Period Weeks    Status On-going      PT LONG TERM GOAL #3   Title Improve bilateral hip  strength to 5/5 to mprove stability and eliminate Trendelenburg gait pattern.    Time 6    Period Weeks    Status On-going                   Plan - 04/04/21 1457     Clinical Impression Statement Patient presented to treatment with moderate gastroc and posterior knee soreness. This was able to be significantly reduced with seated heel and toe raises. She was then able to be progressed with standing interventions for improved function with her daily activities. She required minimal cuing with standing hip extension to maintain knee extension to facilitate posterior chain engagement. She reported that her leg felt better upon the conclusion of treatment. She cotinues to require skilled physical therapy to address her remaining impairments to safely return to her prior level of function.    Personal Factors and Comorbidities Fitness;Comorbidity 1;Comorbidity 2    Comorbidities OA, CKD, CAD, HTN, MI, CTR.    Examination-Activity Limitations Transfers;Other;Locomotion Level    Examination-Participation Restrictions Other;Yard Work    [employer] officer Evolving/Moderate complexity    Rehab Potential Good    PT Frequency 2x / week    PT Duration 6 weeks    PT Treatment/Interventions ADLs/Self Care Home Management;Cryotherapy;Electrical Stimulation;Ultrasound;Moist Heat;Gait training;Stair training;Functional mobility training;Therapeutic activities;Therapeutic exercise;Balance training;Neuromuscular re-education;Manual techniques;Patient/family education;Passive range of motion    PT Next Visit Plan Gentle range of motion to patient's left wrist, STW/M and modalites as needed.  Bilateral LE strengthening.  Nustep.  Gait and balance activities.    Consulted and Agree with Plan of Care Patient             Patient will benefit from skilled therapeutic intervention in order to improve the following deficits and impairments:     Visit Diagnosis: Pain in left  wrist  Muscle weakness (generalized)     Problem List Patient Active Problem List   Diagnosis Date Noted   Subclinical hyperthyroidism 06/06/2019   Normocytic anemia 06/02/2019   Postherpetic neuralgia 09/28/2018   Herpes zoster without complication 21/03/7355   Iron deficiency anemia 08/27/2016   Morbid obesity ([place]) 08/27/2016   CKD (chronic kidney disease), stage III ([place]) 04/04/2016  Osteoarthritis left knee 04/04/2016   Third degree heart block ([place]) 12/10/2015   Pancolonic diverticulosis 12/09/2015   CAD (coronary artery disease) 01/11/2014   History of non-ST elevation myocardial infarction (NSTEMI) 08/23/2013   IDA (iron deficiency anemia) 08/23/2013   Essential hypertension 08/22/2013   GERD (gastroesophageal reflux disease) 08/22/2013   Anxiety 08/22/2013    Darlin Coco, PT 04/04/2021, 3:31 PM  [facility] [address] [place], [place], [address] Phone: [phone]   Fax:  [phone]  Name: SHEARON CLONCH MRN: [id number] Date of Birth: [birth year]

## 2021-04-05 ENCOUNTER — Other Ambulatory Visit (HOSPITAL_COMMUNITY)
Admission: RE | Admit: 2021-04-05 | Discharge: 2021-04-05 | Disposition: A | Discharge status: Home / Self Care | Payer: Medicare Other | Source: Ambulatory Visit | Attending: Nephrology | Admitting: Nephrology

## 2021-04-05 DIAGNOSIS — D638 Anemia in other chronic diseases classified elsewhere: Secondary | ICD-10-CM | POA: Diagnosis not present

## 2021-04-05 DIAGNOSIS — I129 Hypertensive chronic kidney disease with stage 1 through stage 4 chronic kidney disease, or unspecified chronic kidney disease: Secondary | ICD-10-CM | POA: Diagnosis not present

## 2021-04-05 DIAGNOSIS — R809 Proteinuria, unspecified: Secondary | ICD-10-CM | POA: Diagnosis not present

## 2021-04-05 DIAGNOSIS — I5032 Chronic diastolic (congestive) heart failure: Secondary | ICD-10-CM | POA: Diagnosis not present

## 2021-04-05 DIAGNOSIS — N184 Chronic kidney disease, stage 4 (severe): Secondary | ICD-10-CM | POA: Insufficient documentation

## 2021-04-05 LAB — CBC
HCT: 34 % — ABNORMAL LOW (ref 36.0–46.0)
Hemoglobin: 10.8 g/dL — ABNORMAL LOW (ref 12.0–15.0)
MCH: 30.7 pg (ref 26.0–34.0)
MCHC: 31.8 g/dL (ref 30.0–36.0)
MCV: 96.6 fL (ref 80.0–100.0)
Platelets: 284 10*3/uL (ref 150–400)
RBC: 3.52 MIL/uL — ABNORMAL LOW (ref 3.87–5.11)
RDW: 13.5 % (ref 11.5–15.5)
WBC: 5.9 10*3/uL (ref 4.0–10.5)
nRBC: 0 % (ref 0.0–0.2)

## 2021-04-05 LAB — RENAL FUNCTION PANEL
Albumin: 3.5 g/dL (ref 3.5–5.0)
Anion gap: 8 (ref 5–15)
BUN: 28 mg/dL — ABNORMAL HIGH (ref 8–23)
CO2: 23 mmol/L (ref 22–32)
Calcium: 9.4 mg/dL (ref 8.9–10.3)
Chloride: 109 mmol/L (ref 98–111)
Creatinine, Ser: 1.62 mg/dL — ABNORMAL HIGH (ref 0.44–1.00)
GFR, Estimated: 32 mL/min — ABNORMAL LOW (ref >60)
Glucose, Bld: 114 mg/dL — ABNORMAL HIGH (ref 70–99)
Phosphorus: 3.7 mg/dL (ref 2.5–4.6)
Potassium: 4 mmol/L (ref 3.5–5.1)
Sodium: 140 mmol/L (ref 135–145)

## 2021-04-06 LAB — MICROALBUMIN / CREATININE URINE RATIO
Creatinine, Urine: 186.9 mg/dL
Microalb Creat Ratio: 10 mg/g creat (ref 0–29)
Microalb, Ur: 18.6 ug/mL — ABNORMAL HIGH

## 2021-04-09 ENCOUNTER — Ambulatory Visit: Payer: Medicare Other

## 2021-04-18 ENCOUNTER — Other Ambulatory Visit: Payer: Self-pay

## 2021-04-18 ENCOUNTER — Ambulatory Visit: Payer: Medicare Other | Attending: Family Medicine

## 2021-04-18 DIAGNOSIS — M25532 Pain in left wrist: Secondary | ICD-10-CM | POA: Insufficient documentation

## 2021-04-18 DIAGNOSIS — M6281 Muscle weakness (generalized): Secondary | ICD-10-CM | POA: Diagnosis not present

## 2021-04-18 NOTE — Therapy (Signed)
[facility] [place], [place], [address] Phone: [phone]   Fax:  [phone]  Physical Therapy Treatment  Patient Details  Name: Shelly [name] MRN: [id number] Date of Birth: [birth year] Referring Provider (PT): Caryl Pina MD   Encounter Date: 04/18/2021   PT End of Session - 04/18/21 1447     Visit Number 10    Number of Visits 12    Date for PT Re-Evaluation 03/19/21    PT Start Time 1440    PT Stop Time 6812    PT Time Calculation (min) 35 min    Activity Tolerance Patient tolerated treatment well    Behavior During Therapy [facility] for tasks assessed/performed             Past Medical History:  Diagnosis Date   Allergy    Anemia    GI bleed   Anginal pain ([place])    Anxiety    Arthritis    Blood transfusion without reported diagnosis    Cataract    Chronic kidney disease    Coronary artery disease    SEVERE   GERD (gastroesophageal reflux disease)    GI bleed 12/2013   Goiter    Hyperlipidemia    Hypertension    Myocardial infarct [facility])    Myocardial infarction ([place]) 08/2013   Neuralgia of right lower extremity 07/13/2017   Pancolonic diverticulosis 12/2013   Post herpetic neuralgia     Past Surgical History:  Procedure Laterality Date   CARPAL TUNNEL RELEASE Bilateral    COLONOSCOPY N/A 01/13/2014   Dr. Hung:pandiverticulosis    COLONOSCOPY N/A 12/14/2015   Dr. Michail Sermon: pancolonic diverticulosis, internal hemorrhoids    CORNEAL TRANSPLANT     CORONARY ANGIOPLASTY  08/2013   ESOPHAGOGASTRODUODENOSCOPY N/A 12/14/2015   Dr. Michail Sermon: normal    GIVENS CAPSULE STUDY N/A 08/13/2016   Dr. Oneida Alar: enteritis due to aspirin.    HEEL SPUR EXCISION     LEFT HEART CATHETERIZATION WITH CORONARY ANGIOGRAM N/A 08/23/2013   Procedure: LEFT HEART CATHETERIZATION WITH CORONARY ANGIOGRAM;  Surgeon: Peter M Martinique, MD;  Location: [facility] CATH LAB;  Service: Cardiovascular;  Laterality: N/A;   PERCUTANEOUS CORONARY  STENT INTERVENTION (PCI-S) N/A 08/24/2013   Procedure: PERCUTANEOUS CORONARY STENT INTERVENTION (PCI-S);  Surgeon: Peter M Martinique, MD;  Location[facility] CATH LAB;  Service: Cardiovascular;  Laterality: N/A;   TONSILLECTOMY     TUBAL LIGATION      There were no vitals filed for this visit.   Subjective Assessment - 04/18/21 1446     Subjective COVID-19 screen performed prior to patient entering clinic. Patient reports that her right knee has been beginning to bother her. She feels like "in a way I am getting better, but in a way I am not ."    Pertinent History OA, CKD, CAD, HTN, MI, CTR.    Limitations Walking    How long can you walk comfortably? See above.    Diagnostic tests X-ray.    Patient Stated Goals Improve strength,    Currently in Pain? Yes    Pain Score 6     Pain Location Knee    Pain Orientation Right    Pain Onset More than a month ago                [facility] PT Assessment - 04/18/21 0001       Strength   Strength Assessment Site Hip    Right/Left Hip Left;Right    Right Hip Flexion  4/5    Right Hip ABduction 4-/5    Left Hip Flexion 4/5    Left Hip ABduction 4-/5    Left Hip ADduction 4-/5   hip pain     Bed Mobility   Bed Mobility Rolling Right    Rolling Right Independent   significant time to complete                          [facility] Adult PT Treatment/Exercise - 04/18/21 0001       Lumbar Exercises: Sidelying   Hip Abduction Both;15 reps      Knee/Hip Exercises: Aerobic   Nustep L6 x 15 minutes      Knee/Hip Exercises: Seated   Other Seated Knee/Hip Exercises Heel/toe raises   3 minutes   Marching Both;20 reps                          PT Long Term Goals - 04/18/21 1448       PT LONG TERM GOAL #1   Title Independent with a HEP.    Time 6    Period Weeks    Status Partially Met      PT LONG TERM GOAL #2   Title Perform ADL's with left wrist pain not > 2-3/10.    Baseline 6/10 ("it just comes and goes")     Time 6    Period Weeks    Status On-going      PT LONG TERM GOAL #3   Title Improve bilateral hip strength to 5/5 to mprove stability and eliminate Trendelenburg gait pattern.    Time 6    Period Weeks    Status On-going                   Plan - 04/18/21 1447     Clinical Impression Statement Patient has made minimal progress with skilled physical therapy as evidenced by her objective measures and her progress toward her goals. She continues to report wrist and lower extremity pain and difficulty with her daily activities. She was limited by left hip pain and fatigue with today's interventions with sidelying hip abduction being the most difficult. She reported feeling about the same at the conclusion of treatment. Recommend that she continue with her current plan of care to address her remaining impairments. She may be discharged upon the conclusion of this plan of care if no significant progress has been made.    Personal Factors and Comorbidities Fitness;Comorbidity 1;Comorbidity 2    Comorbidities OA, CKD, CAD, HTN, MI, CTR.    Examination-Activity Limitations Transfers;Other;Locomotion Level    Examination-Participation Restrictions Other;Yard Work    [employer]chant [employer] officer Evolving/Moderate complexity    Rehab Potential Good    PT Frequency 2x / week    PT Duration 6 weeks    PT Treatment/Interventions ADLs/Self Care Home Management;Cryotherapy;Electrical Stimulation;Ultrasound;Moist Heat;Gait training;Stair training;Functional mobility training;Therapeutic activities;Therapeutic exercise;Balance training;Neuromuscular re-education;Manual techniques;Patient/family education;Passive range of motion    PT Next Visit Plan Gentle range of motion to patient's left wrist, STW/M and modalites as needed.  Bilateral LE strengthening.  Nustep.  Gait and balance activities.    Consulted and Agree with Plan of Care Patient             Patient will benefit from skilled  therapeutic intervention in order to improve the following deficits and impairments:     Visit Diagnosis: Pain in left wrist  Muscle  weakness (generalized)     Problem List Patient Active Problem List   Diagnosis Date Noted   Subclinical hyperthyroidism 06/06/2019   Normocytic anemia 06/02/2019   Postherpetic neuralgia 09/28/2018   Herpes zoster without complication 07/86/7544   Iron deficiency anemia 08/27/2016   Morbid obesity ([place]) 08/27/2016   CKD (chronic kidney disease), stage III ([place]) 04/04/2016   Osteoarthritis left knee 04/04/2016   Third degree heart block ([place]) 12/10/2015   Pancolonic diverticulosis 12/09/2015   CAD (coronary artery disease) 01/11/2014   History of non-ST elevation myocardial infarction (NSTEMI) 08/23/2013   IDA (iron deficiency anemia) 08/23/2013   Essential hypertension 08/22/2013   GERD (gastroesophageal reflux disease) 08/22/2013   Anxiety 08/22/2013    Darlin Coco, PT 04/18/2021, 5:48 PM  [facility] [address] [place], [place], [address] Phone: [phone]   Fax:  [phone]  Name: Shelly [name] MRN: [id number] Date of Birth: [birth year]  Progress Note Reporting Period 01/22/21 to 04/18/21  See note below for Objective Data and Assessment of Progress/Goals.    See clinical impression statement.

## 2021-04-25 ENCOUNTER — Encounter: Payer: Self-pay | Admitting: Nurse Practitioner

## 2021-04-25 ENCOUNTER — Ambulatory Visit (INDEPENDENT_AMBULATORY_CARE_PROVIDER_SITE_OTHER): Payer: Medicare Other | Admitting: Nurse Practitioner

## 2021-04-25 ENCOUNTER — Other Ambulatory Visit: Payer: Self-pay

## 2021-04-25 VITALS — BP 124/69 | HR 73 | Ht 64.0 in | Wt 236.0 lb

## 2021-04-25 DIAGNOSIS — E0590 Thyrotoxicosis, unspecified without thyrotoxic crisis or storm: Secondary | ICD-10-CM | POA: Diagnosis not present

## 2021-04-25 NOTE — Progress Notes (Signed)
04/25/2021, 1:45 PM                                     Endocrinology follow-up note    Subjective:    Patient ID: Shelly [name], female    DOB: [birth year], PCP [name], [name], MD   Past Medical History:  Diagnosis Date   Allergy    Anemia    GI bleed   Anginal pain ([place])    Anxiety    Arthritis    Blood transfusion without reported diagnosis    Cataract    Chronic kidney disease    Coronary artery disease    SEVERE   GERD (gastroesophageal reflux disease)    GI bleed 12/2013   Goiter    Hyperlipidemia    Hypertension    Myocardial infarct [facility])    Myocardial infarction ([address]) 08/2013   Neuralgia of right lower extremity 07/13/2017   Pancolonic diverticulosis 12/2013   Post herpetic neuralgia    Past Surgical History:  Procedure Laterality Date   CARPAL TUNNEL RELEASE Bilateral    COLONOSCOPY N/A 01/13/2014   Dr. Hung:pandiverticulosis    COLONOSCOPY N/A 12/14/2015   Dr. Michail Sermon: pancolonic diverticulosis, internal hemorrhoids    CORNEAL TRANSPLANT     CORONARY ANGIOPLASTY  08/2013   ESOPHAGOGASTRODUODENOSCOPY N/A 12/14/2015   Dr. Michail Sermon: normal    GIVENS CAPSULE STUDY N/A 08/13/2016   Dr. Oneida Alar: enteritis due to aspirin.    HEEL SPUR EXCISION     LEFT HEART CATHETERIZATION WITH CORONARY ANGIOGRAM N/A 08/23/2013   Procedure: LEFT HEART CATHETERIZATION WITH CORONARY ANGIOGRAM;  Surgeon: Peter M Martinique, MD;  Location: [facility] CATH LAB;  Service: Cardiovascular;  Laterality: N/A;   PERCUTANEOUS CORONARY STENT INTERVENTION (PCI-S) N/A 08/24/2013   Procedure: PERCUTANEOUS CORONARY STENT INTERVENTION (PCI-S);  Surgeon: Peter M Martinique, MD;  Location: [facility] CATH LAB;  Service: Cardiovascular;  Laterality: N/A;   TONSILLECTOMY     TUBAL LIGATION     Social History   Socioeconomic History   Marital status: Divorced    Spouse name: Not on file   Number of children: 8   Years of education: 11   Highest education level:  11th grade  Occupational History   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   No daily caffeine use.   Social Determinants of Health   Financial Resource Strain: Low Risk    Difficulty of Paying Living Expenses: Not very hard  Food Insecurity: No Food Insecurity   Worried About Running Out  of Food in the Last Year: Never true   [place] in the Last Year: Never true  Transportation Needs: No Transportation Needs   Lack of Transportation (Medical): No   Lack of Transportation (Non-Medical): No  Physical Activity: Insufficiently Active   Days of Exercise per Week: 2 days   Minutes of Exercise per Session: 10 min  Stress: Stress Concern Present   Feeling of Stress : To some extent  Social Connections: Socially Isolated   Frequency of Communication with Friends and Family: More than three times a week   Frequency of Social Gatherings with Friends and Family: More than three times a week   Attends Religious Services: Never   Marine scientist or Organizations: No   Attends Archivist Meetings: Never   Marital Status: Widowed   Family History  Problem Relation Age of Onset   Hypertension Brother    Transient ischemic attack Brother    Cancer Mother        unsure of type    Other Father        grangrene    Asthma Sister    Stroke Sister    COPD Sister    Stroke Sister    Early death Brother    Early death  Brother    Liver cancer Daughter    Cancer Daughter    Hernia Son        Umbilical   GI problems Son    Pancreatic disease Daughter        pancreatectomy   Cancer Daughter    Diverticulitis Daughter    Arthritis Daughter        knee    Arthritis Daughter        shoulder    Anemia Daughter    GER disease Son    Colon cancer Neg Hx    Colon polyps Neg Hx    Outpatient Encounter Medications as of 04/25/2021  Medication Sig   amLODipine (NORVASC) 5 MG tablet TAKE ONE TABLET BY MOUTH ONCE DAILY   cetirizine (ZYRTEC) 10 MG tablet Take 10 mg by mouth daily.   chlorthalidone (HYGROTON) 25 MG tablet TAKE ONE TABLET BY MOUTH DAILY   clopidogrel (PLAVIX) 75 MG tablet TAKE ONE TABLET BY MOUTH DAILY   diclofenac Sodium (VOLTAREN) 1 % GEL Apply 2 g topically 4 (four) times daily.   furosemide (LASIX) 40 MG tablet Take 40 mg by mouth daily as needed (weight gain).   gabapentin (NEURONTIN) 100 MG capsule TAKE ONE CAPSULE BY MOUTH THREE TIMES DAILY   hydrocortisone (ANUSOL-HC) 2.5 % rectal cream Place 1 application rectally 2 (two) times daily. As needed for rectal discomfort.   lisinopril (ZESTRIL) 40 MG tablet TAKE ONE TABLET BY MOUTH DAILY   metoprolol succinate (TOPROL-XL) 50 MG 24 hr tablet TAKE ONE TABLET BY MOUTH DAILY *TAKE WITH OR IMMEDIATELY FOLLOWING A MEAL*   Multiple Vitamin (MULTIVITAMIN) tablet Take 1 tablet by mouth daily.   pantoprazole (PROTONIX) 40 MG tablet Take 1 tablet (40 mg total) by mouth as needed. 30 minutes before breakfast   rosuvastatin (CRESTOR) 5 MG tablet Take 5 mg by mouth daily.   triamcinolone (KENALOG) 0.025 % ointment Apply 1 application topically 2 (two) times daily.   rosuvastatin (CRESTOR) 10 MG tablet Take 1 tablet (10 mg total) by mouth daily. (Patient not taking: Reported on 04/25/2021)   No facility-administered encounter medications on file as of 04/25/2021.   ALLERGIES: Allergies  Allergen Reactions   Nsaids  Other (See Comments)    AVOID all NSAID  due to history of GI bleeding   Atorvastatin Other (See Comments)    Joint & muscle pain    VACCINATION STATUS: Immunization History  Administered Date(s) Administered   Pneumococcal Conjugate-13 10/02/2016   Pneumococcal Polysaccharide-23 08/25/2013   Zoster Recombinat (Shingrix) 03/17/2018    Thyroid Problem Presents for follow-up visit. Patient reports no anxiety, cold intolerance, constipation, depressed mood, fatigue, heat intolerance, leg swelling, palpitations, tremors, weight gain or weight loss. The symptoms have been stable.   LEYANA WHIDDEN is 80 y.o. female who is seen in follow-up after she was seen in consultation for subclinical hyperthyroidism.    PMD: [name], [name], MD.  Due to septic symptoms of thyrotoxicosis, she was started on low-dose methimazole, which was since tapered down.  She has been off the Methimazole for some time now with stable thyroid response.  She denies any history of goiter, no family history of thyroid dysfunction or thyroid malignancy.  Her thyroid uptake and scan on June 14, 2019 showed 24-hour uptake of 10.3% which is normal.  There was asymmetric thyroid activity with generally increased uptake in the right lobe.  No focal hot or cold nodules identified.  Her medical history includes hypertension and hyperlipidemia on treatment.  Review of systems  Constitutional: + steadily decreasing body weight,  current Body mass index is 40.51 kg/m. , no fatigue, no subjective hyperthermia, no subjective hypothermia Eyes: no blurry vision, no xerophthalmia ENT: no sore throat, no nodules palpated in throat, no dysphagia/odynophagia, no hoarseness Cardiovascular: no chest pain, no shortness of breath, no palpitations, no leg swelling Respiratory: no cough, no shortness of breath Gastrointestinal: no nausea/vomiting/diarrhea Musculoskeletal: no muscle/joint aches Skin: no rashes, no hyperemia Neurological: no tremors, no numbness, no  tingling, no dizziness Psychiatric: no depression, no anxiety  Objective:    BP 124/69   Pulse 73   Ht 5\' 4"  (1.626 m)   Wt 236 lb (107 kg)   BMI 40.51 kg/m   Wt Readings from Last 3 Encounters:  04/25/21 236 lb (107 kg)  02/28/21 239 lb (108.4 kg)  02/19/21 242 lb 6.4 oz (110 kg)    BP Readings from Last 3 Encounters:  04/25/21 124/69  02/28/21 132/63  02/19/21 (!) 164/63     Physical Exam- Limited  Constitutional:  Body mass index is 40.51 kg/m. , not in acute distress, normal state of mind Eyes:  EOMI, no exophthalmos Neck: Supple Cardiovascular: RRR, no murmurs, rubs, or gallops, no edema Respiratory: Adequate breathing efforts, no crackles, rales, rhonchi, or wheezing Musculoskeletal: no gross deformities, strength intact in all four extremities, no gross restriction of joint movements Skin:  no rashes, no hyperemia Neurological: no tremor with outstretched hands    CMP     Component Value Date/Time   NA 140 04/05/2021 1319   NA 142 02/08/2021 1352   K 4.0 04/05/2021 1319   CL 109 04/05/2021 1319   CO2 23 04/05/2021 1319   GLUCOSE 114 (H) 04/05/2021 1319   BUN 28 (H) 04/05/2021 1319   BUN 32 (H) 02/08/2021 1352   CREATININE 1.62 (H) 04/05/2021 1319   CREATININE 1.31 (H) 12/21/2018 1547   CALCIUM 9.4 04/05/2021 1319   PROT 7.0 02/08/2021 1352   ALBUMIN 3.5 04/05/2021 1319   ALBUMIN 4.0 02/08/2021 1352   AST 10 02/08/2021 1352   ALT 6 02/08/2021 1352   ALKPHOS 91 02/08/2021 1352   BILITOT <0.2 02/08/2021 1352   GFRNONAA 32 (L)  04/05/2021 1319   GFRAA 34 (L) 04/30/2020 1510     Diabetic Labs (most recent): Lab Results  Component Value Date   HGBA1C 6.0 (H) 12/09/2015   HGBA1C 6.0 (H) 08/23/2013     Lipid Panel ( most recent) Lipid Panel     Component Value Date/Time   CHOL 166 02/08/2021 1352   TRIG 155 (H) 02/08/2021 1352   HDL 42 02/08/2021 1352   CHOLHDL 4.0 02/08/2021 1352   CHOLHDL 2.6 11/20/2016 0854   VLDL 11 11/20/2016 0854    LDLCALC 97 02/08/2021 1352   LABVLDL 27 02/08/2021 1352      Lab Results  Component Value Date   TSH 0.224 (L) 04/02/2021   TSH 0.120 (L) 02/08/2021   TSH 0.161 (L) 10/16/2020   TSH 0.227 (L) 07/04/2020   TSH 0.227 (L) 05/03/2020   TSH 0.218 (L) 04/30/2020   TSH 0.384 (L) 01/31/2020   TSH 0.55 01/02/2020   TSH 0.511 10/31/2019   TSH 0.28 (L) 09/23/2019   FREET4 0.88 04/02/2021   FREET4 1.02 10/16/2020   FREET4 1.00 07/04/2020   FREET4 0.97 05/03/2020   FREET4 1.0 01/02/2020   FREET4 0.9 09/23/2019   FREET4 0.9 05/30/2019   FREET4 1.04 12/11/2015   FREET4 1.09 10/19/2013     Latest Reference Range & Units 02/08/21 13:52 04/02/21 13:48  TSH 0.450 - 4.500 uIU/mL 0.120 (L) 0.224 (L)  Triiodothyronine,Free,Serum 2.0 - 4.4 pg/mL  2.4  T4,Free(Direct) 0.82 - 1.77 ng/dL  0.88  (L): Data is abnormally low  Assessment & Plan:   1. Subclinical hyperthyroidism  -Her most recent thyroid function tests are stable, TSH still minimally suppressed but FT3 and FT4 are normal.  There is no need for her to restart Methimazole at this time.  Will monitor TFTs again in 3 months (patient prefers to have more frequent checkups than the 6 month I offered her).    - she is advised to maintain close follow up with [name], [name], MD for primary care needs.    I spent 20 minutes in the care of the patient today including review of labs from Thyroid Function, CMP, and other relevant labs ; imaging/biopsy records (current and previous including abstractions from other facilities); face-to-face time discussing  her lab results and symptoms, medications doses, her options of short and long term treatment based on the latest standards of care / guidelines;   and documenting the encounter.  Shelly [name]  participated in the discussions, expressed understanding, and voiced agreement with the above plans.  All questions were answered to her satisfaction. she is encouraged to contact clinic  should she have any questions or concerns prior to her return visit.   Follow up plan: Return in about 3 months (around 07/24/2021) for Thyroid follow up, Previsit labs.   Rayetta Pigg, [facility] [address] [place], [place] [address] Phone: [phone] Fax: [phone]   04/25/2021, 1:45 PM

## 2021-05-02 ENCOUNTER — Ambulatory Visit: Payer: Medicare Other

## 2021-05-02 ENCOUNTER — Other Ambulatory Visit: Payer: Self-pay

## 2021-05-02 DIAGNOSIS — M6281 Muscle weakness (generalized): Secondary | ICD-10-CM

## 2021-05-02 DIAGNOSIS — M25532 Pain in left wrist: Secondary | ICD-10-CM | POA: Diagnosis not present

## 2021-05-02 NOTE — Therapy (Signed)
[facility] [place], [place], [address] Phone: [phone]   Fax:  [phone]  Physical Therapy Treatment  Patient Details  Name: Shelly [name] MRN: [id number] Date of Birth: Jun 09, 1940 Referring Provider (PT): Caryl Pina MD   Encounter Date: 05/02/2021   PT End of Session - 05/02/21 1430     Visit Number 11    Number of Visits 12    Date for PT Re-Evaluation 05/17/21    PT Start Time 1430    PT Stop Time 5993    PT Time Calculation (min) 45 min    Activity Tolerance Patient tolerated treatment well    Behavior During Therapy [facility] for tasks assessed/performed             Past Medical History:  Diagnosis Date   Allergy    Anemia    GI bleed   Anginal pain ([place])    Anxiety    Arthritis    Blood transfusion without reported diagnosis    Cataract    Chronic kidney disease    Coronary artery disease    SEVERE   GERD (gastroesophageal reflux disease)    GI bleed 12/2013   Goiter    Hyperlipidemia    Hypertension    Myocardial infarct [facility])    Myocardial infarction ([place]) 08/2013   Neuralgia of right lower extremity 07/13/2017   Pancolonic diverticulosis 12/2013   Post herpetic neuralgia     Past Surgical History:  Procedure Laterality Date   CARPAL TUNNEL RELEASE Bilateral    COLONOSCOPY N/A 01/13/2014   Dr. Hung:pandiverticulosis    COLONOSCOPY N/A 12/14/2015   Dr. Michail Sermon: pancolonic diverticulosis, internal hemorrhoids    CORNEAL TRANSPLANT     CORONARY ANGIOPLASTY  08/2013   ESOPHAGOGASTRODUODENOSCOPY N/A 12/14/2015   Dr. Michail Sermon: normal    GIVENS CAPSULE STUDY N/A 08/13/2016   Dr. Oneida Alar: enteritis due to aspirin.    HEEL SPUR EXCISION     LEFT HEART CATHETERIZATION WITH CORONARY ANGIOGRAM N/A 08/23/2013   Procedure: LEFT HEART CATHETERIZATION WITH CORONARY ANGIOGRAM;  Surgeon: Peter M Martinique, MD;  Location: [facility] CATH LAB;  Service: Cardiovascular;  Laterality: N/A;   PERCUTANEOUS CORONARY  STENT INTERVENTION (PCI-S) N/A 08/24/2013   Procedure: PERCUTANEOUS CORONARY STENT INTERVENTION (PCI-S);  Surgeon: Peter M Martinique, MD;  Location: [facility] CATH LAB;  Service: Cardiovascular;  Laterality: N/A;   TONSILLECTOMY     TUBAL LIGATION      There were no vitals filed for this visit.   Subjective Assessment - 05/02/21 1450     Subjective COVID-19 screen performed prior to patient entering clinic. Patient reports that she has been able to walk without her walker. She notes that she was able to walkout outside and down to her mailbox for the first time in a while. She notes that she still has good and bad days. She feels that today is a good day. She notes that her knee is popping which is painful.    Pertinent History OA, CKD, CAD, HTN, MI, CTR.    Limitations Walking    How long can you walk comfortably? See above.    Diagnostic tests X-ray.    Patient Stated Goals Improve strength,    Currently in Pain? Yes    Pain Score 5     Pain Location Knee    Pain Orientation Right    Pain Onset More than a month ago  [facility] Adult PT Treatment/Exercise - 05/02/21 0001       Knee/Hip Exercises: Aerobic   Nustep L6 x 15 minutes      Knee/Hip Exercises: Standing   Hip Abduction Stengthening;Both;2 sets;10 reps;Knee straight    Functional Squat 2 sets;10 reps   with UE support     Knee/Hip Exercises: Seated   Long Arc Quad Strengthening;Both;3 sets;10 reps;Weights    Long Arc Quad Weight 5 lbs.    Other Seated Knee/Hip Exercises Heel/toe raises   30 reps   Marching Strengthening;Both;20 reps;Weights    Marching Weights 5 lbs.                          PT Long Term Goals - 04/18/21 1448       PT LONG TERM GOAL #1   Title Independent with a HEP.    Time 6    Period Weeks    Status Partially Met      PT LONG TERM GOAL #2   Title Perform ADL's with left wrist pain not > 2-3/10.    Baseline 6/10 ("it just comes and  goes")    Time 6    Period Weeks    Status On-going      PT LONG TERM GOAL #3   Title Improve bilateral hip strength to 5/5 to mprove stability and eliminate Trendelenburg gait pattern.    Time 6    Period Weeks    Status On-going                   Plan - 05/02/21 1501     Clinical Impression Statement Patient was progressed with familiar interventions with moderate difficulty and fatigue. She required minimal cuing throughout treatment to utilize rest breaks to maintain proper biomechanics as she would experience a deterioration in form as she became fatigued. She reported feeling good upon the conclusion of treatment. She continues to require skilled physical therapy to address her remaining impairments to maximize her functional mobility.    Personal Factors and Comorbidities Fitness;Comorbidity 1;Comorbidity 2    Comorbidities OA, CKD, CAD, HTN, MI, CTR.    Examination-Activity Limitations Transfers;Other;Locomotion Level    Examination-Participation Restrictions Other;Yard Work    Conservation officer, historic buildings Evolving/Moderate complexity    Rehab Potential Good    PT Frequency 2x / week    PT Duration 6 weeks    PT Treatment/Interventions ADLs/Self Care Home Management;Cryotherapy;Electrical Stimulation;Ultrasound;Moist Heat;Gait training;Stair training;Functional mobility training;Therapeutic activities;Therapeutic exercise;Balance training;Neuromuscular re-education;Manual techniques;Patient/family education;Passive range of motion    PT Next Visit Plan Gentle range of motion to patient's left wrist, STW/M and modalites as needed.  Bilateral LE strengthening.  Nustep.  Gait and balance activities.    Consulted and Agree with Plan of Care Patient             Patient will benefit from skilled therapeutic intervention in order to improve the following deficits and impairments:     Visit Diagnosis: Pain in left wrist  Muscle weakness  (generalized)     Problem List Patient Active Problem List   Diagnosis Date Noted   Subclinical hyperthyroidism 06/06/2019   Normocytic anemia 06/02/2019   Postherpetic neuralgia 09/28/2018   Herpes zoster without complication 04/24/2018   Iron deficiency anemia 08/27/2016   Morbid obesity (HCC) 08/27/2016   CKD (chronic kidney disease), stage III (HCC) 04/04/2016   Osteoarthritis left knee 04/04/2016   Third degree heart block (HCC) 12/10/2015   Pancolonic diverticulosis 12/09/2015  CAD (coronary artery disease) 01/11/2014   History of non-ST elevation myocardial infarction (NSTEMI) 08/23/2013   IDA (iron deficiency anemia) 08/23/2013   Essential hypertension 08/22/2013   GERD (gastroesophageal reflux disease) 08/22/2013   Anxiety 08/22/2013    Darlin Coco, PT 05/02/2021, 4:53 PM  [facility] [address] [place], [place], [address] Phone: [phone]   Fax:  [phone]  Name: DAVITA SUBLETT MRN: [id number] Date of Birth: Apr 27, 1941

## 2021-05-09 ENCOUNTER — Ambulatory Visit: Payer: Medicare Other

## 2021-05-09 ENCOUNTER — Ambulatory Visit: Payer: Medicare Other | Admitting: Family Medicine

## 2021-05-16 ENCOUNTER — Other Ambulatory Visit: Payer: Self-pay

## 2021-05-16 ENCOUNTER — Ambulatory Visit: Payer: Medicare Other

## 2021-05-16 DIAGNOSIS — M25532 Pain in left wrist: Secondary | ICD-10-CM

## 2021-05-16 DIAGNOSIS — M6281 Muscle weakness (generalized): Secondary | ICD-10-CM

## 2021-05-16 NOTE — Therapy (Signed)
[facility] [place], [place], [address] Phone: [phone]   Fax:  [phone]  Physical Therapy Treatment  Patient Details  Name: Shelly [name] MRN: [id number] Date of Birth: 01-22-1941 Referring Provider (PT): Caryl Pina MD   Encounter Date: 05/16/2021   PT End of Session - 05/16/21 1438     Visit Number 12    Number of Visits 12    Date for PT Re-Evaluation 05/17/21    PT Start Time 7564    PT Stop Time 3329    PT Time Calculation (min) 43 min    Activity Tolerance Patient tolerated treatment well    Behavior During Therapy [facility] for tasks assessed/performed             Past Medical History:  Diagnosis Date   Allergy    Anemia    GI bleed   Anginal pain ([place])    Anxiety    Arthritis    Blood transfusion without reported diagnosis    Cataract    Chronic kidney disease    Coronary artery disease    SEVERE   GERD (gastroesophageal reflux disease)    GI bleed 12/2013   Goiter    Hyperlipidemia    Hypertension    Myocardial infarct [facility])    Myocardial infarction ([place]) 08/2013   Neuralgia of right lower extremity 07/13/2017   Pancolonic diverticulosis 12/2013   Post herpetic neuralgia     Past Surgical History:  Procedure Laterality Date   CARPAL TUNNEL RELEASE Bilateral    COLONOSCOPY N/A 01/13/2014   Dr. Hung:pandiverticulosis    COLONOSCOPY N/A 12/14/2015   Dr. Michail Sermon: pancolonic diverticulosis, internal hemorrhoids    CORNEAL TRANSPLANT     CORONARY ANGIOPLASTY  08/2013   ESOPHAGOGASTRODUODENOSCOPY N/A 12/14/2015   Dr. Michail Sermon: normal    GIVENS CAPSULE STUDY N/A 08/13/2016   Dr. Oneida Alar: enteritis due to aspirin.    HEEL SPUR EXCISION     LEFT HEART CATHETERIZATION WITH CORONARY ANGIOGRAM N/A 08/23/2013   Procedure: LEFT HEART CATHETERIZATION WITH CORONARY ANGIOGRAM;  Surgeon: Peter M Martinique, MD;  Location: [facility] CATH LAB;  Service: Cardiovascular;  Laterality: N/A;   PERCUTANEOUS CORONARY  STENT INTERVENTION (PCI-S) N/A 08/24/2013   Procedure: PERCUTANEOUS CORONARY STENT INTERVENTION (PCI-S);  Surgeon: Peter M Martinique, MD;  Location: [facility] CATH LAB;  Service: Cardiovascular;  Laterality: N/A;   TONSILLECTOMY     TUBAL LIGATION      There were no vitals filed for this visit.   Subjective Assessment - 05/16/21 1437     Subjective COVID-19 screen performed prior to patient entering clinic. Marland Kitchen Patient reports that she feels good. She notes that the popping is really hurts and feels like she will fall. She reports compliance with her HEP. She feels like her hand is getting weaker.    Pertinent History OA, CKD, CAD, HTN, MI, CTR.    Limitations Walking    How long can you walk comfortably? See above.    Diagnostic tests X-ray.    Patient Stated Goals Improve strength,    Pain Score 3    2-3/10   Pain Location Wrist    Pain Orientation Left    Pain Onset More than a month ago                               [facility] Adult PT Treatment/Exercise - 05/16/21 0001       Lumbar Exercises: Standing  Other Standing Lumbar Exercises Slouch/overcorrect   2 minutes   Other Standing Lumbar Exercises Lumbar flexion   20 reps     Knee/Hip Exercises: Aerobic   Nustep L6 x 15 minutes      Knee/Hip Exercises: Standing   Hip Abduction Stengthening;Both;2 sets;10 reps;Knee straight      Knee/Hip Exercises: Seated   Long Arc Quad Both;3 sets;10 reps    Other Seated Knee/Hip Exercises Heel/toe raises   2 minutes     Wrist Exercises   Wrist Extension Strengthening;Both;20 reps;Seated;Theraband    Theraband Level (Wrist Extension) Level 3 Nyoka Cowden)                     PT Education - 05/16/21 1624     Education Details continuing with her HEP, contacting her physician regarding her knee popping, and her progress with therapy as she no longer requires her walker for community ambulation    Person(s) Educated Patient    Methods Explanation    Comprehension  Verbalized understanding                 PT Long Term Goals - 05/16/21 1448       PT LONG TERM GOAL #1   Title Independent with a HEP.    Time 6    Period Weeks    Status Achieved      PT LONG TERM GOAL #2   Title Perform ADL's with left wrist pain not > 2-3/10.    Baseline 2-3/10    Time 6    Period Weeks    Status Achieved      PT LONG TERM GOAL #3   Title Improve bilateral hip strength to 5/5 to mprove stability and eliminate Trendelenburg gait pattern.    Baseline Left hip: 4/5 Right hip: 4-/5    Time 6    Period Weeks    Status Not Met                   Plan - 05/16/21 1438     Clinical Impression Statement Patient was able to meet most of her goals for physical therapy. However, she continues to experience intermittent painful popping in her left knee, but she feels like this has gotten worse in recent weeks. She was educated on contacting her physician regarding these symptoms. She was able to complete today's interventions with minimal cuing for upright stance. She reported feeling comfortable continuing with her HEP at home. She reported feeling comfortable being discharged at this time.    Personal Factors and Comorbidities Fitness;Comorbidity 1;Comorbidity 2    Comorbidities OA, CKD, CAD, HTN, MI, CTR.    Examination-Activity Limitations Transfers;Other;Locomotion Level    Examination-Participation Restrictions Other;Yard Work    [employer]chant [employer] officer Evolving/Moderate complexity    Rehab Potential Good    PT Frequency 2x / week    PT Duration 6 weeks    PT Treatment/Interventions ADLs/Self Care Home Management;Cryotherapy;Electrical Stimulation;Ultrasound;Moist Heat;Gait training;Stair training;Functional mobility training;Therapeutic activities;Therapeutic exercise;Balance training;Neuromuscular re-education;Manual techniques;Patient/family education;Passive range of motion    PT Next Visit Plan Gentle range of motion to patient's  left wrist, STW/M and modalites as needed.  Bilateral LE strengthening.  Nustep.  Gait and balance activities.    Consulted and Agree with Plan of Care Patient             Patient will benefit from skilled therapeutic intervention in order to improve the following deficits and impairments:     Visit Diagnosis: Pain in  left wrist  Muscle weakness (generalized)     Problem List Patient Active Problem List   Diagnosis Date Noted   Subclinical hyperthyroidism 06/06/2019   Normocytic anemia 06/02/2019   Postherpetic neuralgia 09/28/2018   Herpes zoster without complication 46/27/0350   Iron deficiency anemia 08/27/2016   Morbid obesity ([place]) 08/27/2016   CKD (chronic kidney disease), stage III ([place]) 04/04/2016   Osteoarthritis left knee 04/04/2016   Third degree heart block ([place]) 12/10/2015   Pancolonic diverticulosis 12/09/2015   CAD (coronary artery disease) 01/11/2014   History of non-ST elevation myocardial infarction (NSTEMI) 08/23/2013   IDA (iron deficiency anemia) 08/23/2013   Essential hypertension 08/22/2013   GERD (gastroesophageal reflux disease) 08/22/2013   Anxiety 08/22/2013    Darlin Coco, PT 05/16/2021, 4:27 PM  [facility] [address] [place], [place], [address] Phone: [phone]   Fax:  [phone]  Name: Shelly [name] MRN: [id number] Date of Birth: [birth year]  PHYSICAL THERAPY DISCHARGE SUMMARY  Visits from Start of Care: 12  Current functional level related to goals / functional outcomes: See clinical impression statement   Remaining deficits: Left knee painful popping    Education / Equipment: HEP    Patient agrees to discharge. Patient goals were partially met. Patient is being discharged due to maximized rehab potential.

## 2021-05-21 ENCOUNTER — Other Ambulatory Visit: Payer: Self-pay | Admitting: Cardiology

## 2021-05-22 ENCOUNTER — Other Ambulatory Visit: Payer: Self-pay | Admitting: Family Medicine

## 2021-05-22 MED ORDER — PANTOPRAZOLE SODIUM 40 MG PO TBEC: 40.0000 mg | DELAYED_RELEASE_TABLET | ORAL | 0 refills | Status: DC | PRN

## 2021-05-22 NOTE — Addendum Note (Signed)
Addended by: Antonietta Barcelona D on: 05/22/2021 11:00 AM   Modules accepted: Orders

## 2021-05-27 ENCOUNTER — Telehealth: Payer: Medicare Other

## 2021-06-12 ENCOUNTER — Ambulatory Visit (INDEPENDENT_AMBULATORY_CARE_PROVIDER_SITE_OTHER): Payer: Medicare Other | Admitting: Licensed Clinical Social Worker

## 2021-06-12 DIAGNOSIS — K5730 Diverticulosis of large intestine without perforation or abscess without bleeding: Secondary | ICD-10-CM

## 2021-06-12 DIAGNOSIS — D508 Other iron deficiency anemias: Secondary | ICD-10-CM

## 2021-06-12 DIAGNOSIS — I10 Essential (primary) hypertension: Secondary | ICD-10-CM

## 2021-06-12 DIAGNOSIS — M1712 Unilateral primary osteoarthritis, left knee: Secondary | ICD-10-CM

## 2021-06-12 DIAGNOSIS — N1832 Chronic kidney disease, stage 3b: Secondary | ICD-10-CM

## 2021-06-12 DIAGNOSIS — E782 Mixed hyperlipidemia: Secondary | ICD-10-CM

## 2021-06-12 DIAGNOSIS — I2510 Atherosclerotic heart disease of native coronary artery without angina pectoris: Secondary | ICD-10-CM

## 2021-06-12 DIAGNOSIS — K219 Gastro-esophageal reflux disease without esophagitis: Secondary | ICD-10-CM

## 2021-06-12 DIAGNOSIS — F419 Anxiety disorder, unspecified: Secondary | ICD-10-CM

## 2021-06-12 DIAGNOSIS — I252 Old myocardial infarction: Secondary | ICD-10-CM

## 2021-06-12 NOTE — Chronic Care Management (AMB) (Signed)
Chronic Care Management    Clinical Social Work Note  06/12/2021 Name: Shelly [name] MRN: [id number] DOB: [birth year]  Shelly [name] is a 81 y.o. year old female who is a primary care patient of [name], [name], MD. The CCM team was consulted to assist the patient with chronic disease management and/or care coordination needs related to: [employer] .   Engaged with patient by telephone for follow up visit in response to provider referral for social work chronic care management and care coordination services.   Consent to Services:  The patient was given information about Chronic Care Management services, agreed to services, and gave verbal consent prior to initiation of services.  Please see initial visit note for detailed documentation.   Patient agreed to services and consent obtained.   Assessment: Review of patient past medical history, allergies, medications, and health status, including review of relevant consultants reports was performed today as part of a comprehensive evaluation and provision of chronic care management and care coordination services.     SDOH (Social Determinants of Health) assessments and interventions performed:  SDOH Interventions    Flowsheet Row Most Recent Value  SDOH Interventions   Physical Activity Interventions Other (Comments)  [walking challenges. she uses a cane to help her walk]  Stress Interventions Provide Counseling  [client has stress related to managing medical needs. client has stress related to paying for her medications and has stress related to paying copay bills for medical care received]  Depression Interventions/Treatment  Currently on Treatment        Advanced Directives Status: See Vynca application for related entries.  CCM Care Plan  Allergies  Allergen Reactions   Nsaids Other (See Comments)    AVOID all NSAID due to history of GI bleeding   Atorvastatin Other (See Comments)    Joint & muscle pain     Outpatient Encounter Medications as of 06/12/2021  Medication Sig   amLODipine (NORVASC) 5 MG tablet TAKE ONE TABLET BY MOUTH ONCE DAILY   cetirizine (ZYRTEC) 10 MG tablet Take 10 mg by mouth daily.   chlorthalidone (HYGROTON) 25 MG tablet TAKE ONE TABLET BY MOUTH DAILY   clopidogrel (PLAVIX) 75 MG tablet TAKE ONE TABLET BY MOUTH DAILY   diclofenac Sodium (VOLTAREN) 1 % GEL Apply 2 g topically 4 (four) times daily.   furosemide (LASIX) 40 MG tablet Take 40 mg by mouth daily as needed (weight gain).   gabapentin (NEURONTIN) 100 MG capsule TAKE ONE CAPSULE BY MOUTH THREE TIMES DAILY   hydrocortisone (ANUSOL-HC) 2.5 % rectal cream Place 1 application rectally 2 (two) times daily. As needed for rectal discomfort.   lisinopril (ZESTRIL) 40 MG tablet TAKE ONE TABLET BY MOUTH DAILY   metoprolol succinate (TOPROL-XL) 50 MG 24 hr tablet TAKE ONE TABLET BY MOUTH DAILY *TAKE WITH OR IMMEDIATELY FOLLOWING A MEAL*   Multiple Vitamin (MULTIVITAMIN) tablet Take 1 tablet by mouth daily.   pantoprazole (PROTONIX) 40 MG tablet Take 1 tablet (40 mg total) by mouth as needed. 30 minutes before breakfast   rosuvastatin (CRESTOR) 10 MG tablet Take 1 tablet (10 mg total) by mouth daily. (Patient not taking: Reported on 04/25/2021)   rosuvastatin (CRESTOR) 5 MG tablet Take 5 mg by mouth daily.   triamcinolone (KENALOG) 0.025 % ointment Apply 1 application topically 2 (two) times daily.   No facility-administered encounter medications on file as of 06/12/2021.    Patient Active Problem List   Diagnosis Date Noted   Subclinical hyperthyroidism 06/06/2019  Normocytic anemia 06/02/2019   Postherpetic neuralgia 09/28/2018   Herpes zoster without complication 43/32/9518   Iron deficiency anemia 08/27/2016   Morbid obesity ([place]) 08/27/2016   CKD (chronic kidney disease), stage III ([place]) 04/04/2016   Osteoarthritis left knee 04/04/2016   Third degree heart block ([place]) 12/10/2015   Pancolonic diverticulosis  12/09/2015   CAD (coronary artery disease) 01/11/2014   History of non-ST elevation myocardial infarction (NSTEMI) 08/23/2013   IDA (iron deficiency anemia) 08/23/2013   Essential hypertension 08/22/2013   GERD (gastroesophageal reflux disease) 08/22/2013   Anxiety 08/22/2013    Conditions to be addressed/monitored: monitor client management of anxiety issues. Monitor client management of grief issues  Care Plan : LCSW Care Plan  Updates made by Katha Cabal, LCSW since 06/12/2021 12:00 AM     Problem: Depression Identification (Depression)      Goal: Depressive Symptoms Identified;Manage Depression symptoms and manage grief symptoms   Start Date: 06/12/2021  Expected End Date: 09/03/2021  This Visit's Progress: On track  Recent Progress: On track  Priority: Medium  Note:   Current Barriers:  Chronic Mental Health needs related to anxiety issues and grief issues Suicidal Ideation/Homicidal Ideation: No Depression issues Mobility issues Pain issues  Clinical Social Work Goal(s):  patient will work with SW monthly by telephone or in person to reduce or manage symptoms related to anxiety and grief issues of client patient will attend all scheduled medical appointments as evidenced by patient report and care team review of appointment completion in EMR:   Patient will talk with Dr. Lottie Dawson, [facility] Pharmacist in next 30 days to discuss medication use of client and options for client related to managing Shingles  Interventions:  1:1 collaboration with Dr. [name] [name] regarding development and update of comprehensive plan of care as evidenced by provider attestation and co-signature Discussed knee pain issues with client. She said she has knee pain occasionally in her left knee.  Discussed ambulation of client (client has a walker to use as needed; client also has a cane to use as needed to help her walk) Discussed physical therapy sessions received by client. She said  she is now finished with physical therapy sessions. However, she does do exercises in the home.   Provided counseling support for client Encouraged client to call RNCM as needed for nursing support for client. Reviewed client appetite. Client said she has a reduced appetite Reviewed client mood status. Client said she thought her mood was stable Discussed financial challenges of client. She said she sometimes has difficulty paying for her medications. She said she has difficulty in paying co-pays for medical care received Discussed upcoming appointments. She said she has appointments scheduled with Gastroenterologist, Cardiologist, and with Dr. Warrick Parisian  Patient Self Care Activities:  Eats meals independently Takes medications as prescribed Attends scheduled medical appointments  Patient Coping Strengths:  Completes ADLs Completes IADLS Makes transport arrangements as needed Attends medical appointments Communicates with LCSW or RNCM as needed for CCM support  Patient Self Care Deficits:  Grief issues Depression issues  Patient Goals:  - spend time or talk with others every day - practice relaxation or meditation daily - keep a calendar with appointment dates Talk with RNCM or LCSW as needed for support  Follow Up Plan: LCSW to call client on  08/07/21 at 10:00 AM M to assess client needs     Norva Riffle.Carollyn Etcheverry MSW, [place] Holiday representative [facility] Care Management [phone]

## 2021-06-12 NOTE — Patient Instructions (Addendum)
Visit Information  Patient Goals:  Manage My Emotions (Patient). Manage Grief issues faced. Manage depression issues faced  Timeframe:  Short-Term Goal Priority:  Medium Progress: On Track Start Date:  06/12/21                          Expected End Date:  09/03/21                     Follow Up Date  08/07/21 at 10:00 AM   Manage My Emotions (Patient) Manage Grief issues faced ; Manage depression issues faced   Why is this important?   When you are stressed, down or upset, your body reacts too.  For example, your blood pressure may get higher; you may have a headache or stomachache.  When your emotions get the best of you, your body's ability to fight off cold and flu gets weak.  These steps will help you manage your emotions.     Patient Self Care Activities:  Eats meals independently Takes medications as prescribed Attends scheduled medical appointments  Patient Coping Strengths:  Completes ADLs Completes IADLS Makes transport arrangements as needed Attends medical appointments Communicates with LCSW or RNCM as needed for CCM support  Patient Self Care Deficits:  Grief issues Depression issues  Patient Goals:  - spend time or talk with others every day - practice relaxation or meditation daily - keep a calendar with appointment dates Talk with RNCM or LCSW as needed for support  Follow Up Plan: LCSW to call client on 08/07/21 at 10:00 AM to assess client needs   Norva Riffle.Dimonique Bourdeau MSW, [place] Holiday representative [facility] Care Management [phone]

## 2021-06-17 ENCOUNTER — Ambulatory Visit: Payer: Medicare Other | Admitting: Family Medicine

## 2021-06-18 DIAGNOSIS — I10 Essential (primary) hypertension: Secondary | ICD-10-CM

## 2021-06-18 DIAGNOSIS — I252 Old myocardial infarction: Secondary | ICD-10-CM

## 2021-06-18 DIAGNOSIS — I2510 Atherosclerotic heart disease of native coronary artery without angina pectoris: Secondary | ICD-10-CM

## 2021-06-18 DIAGNOSIS — E782 Mixed hyperlipidemia: Secondary | ICD-10-CM

## 2021-06-18 DIAGNOSIS — M1712 Unilateral primary osteoarthritis, left knee: Secondary | ICD-10-CM

## 2021-06-18 DIAGNOSIS — D508 Other iron deficiency anemias: Secondary | ICD-10-CM

## 2021-06-19 ENCOUNTER — Ambulatory Visit: Payer: Medicare Other | Admitting: Family Medicine

## 2021-06-27 ENCOUNTER — Ambulatory Visit (INDEPENDENT_AMBULATORY_CARE_PROVIDER_SITE_OTHER): Payer: Medicare Other | Admitting: Family Medicine

## 2021-06-27 ENCOUNTER — Encounter: Payer: Self-pay | Admitting: Family Medicine

## 2021-06-27 VITALS — BP 136/75 | HR 64 | Temp 97.7°F | Resp 20 | Ht 64.0 in | Wt 239.0 lb

## 2021-06-27 DIAGNOSIS — E0590 Thyrotoxicosis, unspecified without thyrotoxic crisis or storm: Secondary | ICD-10-CM

## 2021-06-27 DIAGNOSIS — N1832 Chronic kidney disease, stage 3b: Secondary | ICD-10-CM

## 2021-06-27 DIAGNOSIS — F419 Anxiety disorder, unspecified: Secondary | ICD-10-CM | POA: Diagnosis not present

## 2021-06-27 DIAGNOSIS — I10 Essential (primary) hypertension: Secondary | ICD-10-CM | POA: Diagnosis not present

## 2021-06-27 DIAGNOSIS — B0229 Other postherpetic nervous system involvement: Secondary | ICD-10-CM | POA: Diagnosis not present

## 2021-06-27 DIAGNOSIS — Z13220 Encounter for screening for lipoid disorders: Secondary | ICD-10-CM | POA: Diagnosis not present

## 2021-06-27 NOTE — Progress Notes (Signed)
BP 136/75    Pulse 64    Temp 97.7 F (36.5 C) (Oral)    Resp 20    Ht $R'5\' 4"'Tx$  (1.626 m)    Wt 239 lb (108.4 kg)    SpO2 96%    BMI 41.02 kg/m    Subjective:   Patient ID: Shelly [name], female    DOB: 1940/10/08, 81 y.o.   MRN: [id number]  HPI: Shelly [name] is a 81 y.o. female presenting on 06/27/2021 for Medical Management of Chronic Issues   HPI Hypothyroidism recheck Patient is coming in for thyroid recheck today as well. They deny any issues with hair changes or heat or cold problems or diarrhea or constipation. They deny any chest pain or palpitations. They are currently on no medication, has been subclinical  Hypertension Patient is currently on lisinopril and amlodipine and furosemide chlorthalidone, and their blood pressure today is 136/75. Patient denies any lightheadedness or dizziness. Patient denies headaches, blurred vision, chest pains, shortness of breath, or weakness. Denies any side effects from medication and is content with current medication.   Anxiety and neuropathy recheck He is coming in for anxiety neuropathy.  She is taking gabapentin and it does seem to be helping some but not significantly.  She had side effects when we tried to increase it.  Recommended for her to go ahead and try and take 1 extra tablet a day for a week and if works then we will see if that helps.  Relevant past medical, surgical, family and social history reviewed and updated as indicated. Interim medical history since our last visit reviewed. Allergies and medications reviewed and updated.  Review of Systems  Constitutional:  Negative for chills and fever.  Eyes:  Negative for visual disturbance.  Respiratory:  Negative for chest tightness and shortness of breath.   Cardiovascular:  Negative for chest pain and leg swelling.  Musculoskeletal:  Negative for back pain and gait problem.  Skin:  Negative for rash.  Neurological:  Negative for dizziness, light-headedness and headaches.   Psychiatric/Behavioral:  Negative for agitation and behavioral problems.   All other systems reviewed and are negative.  Per HPI unless specifically indicated above   Allergies as of 06/27/2021       Reactions   Nsaids Other (See Comments)   AVOID all NSAID due to history of GI bleeding   Atorvastatin Other (See Comments)   Joint & muscle pain        Medication List        Accurate as of June 27, 2021 12:05 PM. If you have any questions, ask your nurse or doctor.          amLODipine 5 MG tablet Commonly known as: NORVASC TAKE ONE TABLET BY MOUTH ONCE DAILY   cetirizine 10 MG tablet Commonly known as: ZYRTEC Take 10 mg by mouth daily.   chlorthalidone 25 MG tablet Commonly known as: HYGROTON TAKE ONE TABLET BY MOUTH DAILY   clopidogrel 75 MG tablet Commonly known as: PLAVIX TAKE ONE TABLET BY MOUTH DAILY   diclofenac Sodium 1 % Gel Commonly known as: Voltaren Apply 2 g topically 4 (four) times daily.   furosemide 40 MG tablet Commonly known as: LASIX Take 40 mg by mouth daily as needed (weight gain).   gabapentin 100 MG capsule Commonly known as: NEURONTIN TAKE ONE CAPSULE BY MOUTH THREE TIMES DAILY   hydrocortisone 2.5 % rectal cream Commonly known as: ANUSOL-HC Place 1 application rectally 2 (two) times daily.  As needed for rectal discomfort.   lisinopril 40 MG tablet Commonly known as: ZESTRIL TAKE ONE TABLET BY MOUTH DAILY   metoprolol succinate 50 MG 24 hr tablet Commonly known as: TOPROL-XL TAKE ONE TABLET BY MOUTH DAILY *TAKE WITH OR IMMEDIATELY FOLLOWING A MEAL*   multivitamin tablet Take 1 tablet by mouth daily.   pantoprazole 40 MG tablet Commonly known as: PROTONIX Take 1 tablet (40 mg total) by mouth as needed. 30 minutes before breakfast   rosuvastatin 10 MG tablet Commonly known as: Crestor Take 1 tablet (10 mg total) by mouth daily.   rosuvastatin 5 MG tablet Commonly known as: CRESTOR Take 5 mg by mouth daily.    triamcinolone 0.025 % ointment Commonly known as: KENALOG Apply 1 application topically 2 (two) times daily.         Objective:   BP 136/75    Pulse 64    Temp 97.7 F (36.5 C) (Oral)    Resp 20    Ht $R'5\' 4"'UH$  (1.626 m)    Wt 239 lb (108.4 kg)    SpO2 96%    BMI 41.02 kg/m   Wt Readings from Last 3 Encounters:  06/27/21 239 lb (108.4 kg)  04/25/21 236 lb (107 kg)  02/28/21 239 lb (108.4 kg)    Physical Exam Vitals and nursing note reviewed.  Constitutional:      General: She is not in acute distress.    Appearance: She is well-developed. She is not diaphoretic.  Eyes:     Conjunctiva/sclera: Conjunctivae normal.  Cardiovascular:     Rate and Rhythm: Normal rate and regular rhythm.     Heart sounds: Normal heart sounds. No murmur heard. Pulmonary:     Effort: Pulmonary effort is normal. No respiratory distress.     Breath sounds: Normal breath sounds. No wheezing.  Musculoskeletal:        General: No swelling or tenderness. Normal range of motion.  Skin:    General: Skin is warm and dry.     Findings: No rash.  Neurological:     Mental Status: She is alert and oriented to person, place, and time.     Coordination: Coordination normal.  Psychiatric:        Behavior: Behavior normal.      Assessment & Plan:   Problem List Items Addressed This Visit       Cardiovascular and Mediastinum   Essential hypertension - Primary   Relevant Orders   CBC with Differential/Platelet   CMP14+EGFR     Endocrine   Subclinical hyperthyroidism   Relevant Orders   TSH     Nervous and Auditory   Postherpetic neuralgia     Genitourinary   CKD (chronic kidney disease), stage III (HCC)     Other   Anxiety   Other Visit Diagnoses     Lipid screening       Relevant Orders   Lipid panel       Recommended that she possibly could increase the gabapentin by taking 1 extra day for a week and if goes well then we can change the prescription but if she has side effects then  she can back off to what she is taking currently. Follow up plan: Return in about 3 months (around 09/24/2021), or if symptoms worsen or fail to improve, for Hypothyroidism and hypertension.  Counseling provided for all of the vaccine components Orders Placed This Encounter  Procedures   CBC with Differential/Platelet   CMP14+EGFR   Lipid  panel   TSH    Caryl Pina, MD [facility] 06/27/2021, 12:05 PM

## 2021-06-28 ENCOUNTER — Telehealth: Payer: Self-pay | Admitting: Family Medicine

## 2021-06-28 LAB — LIPID PANEL

## 2021-06-28 NOTE — Telephone Encounter (Signed)
PATIENT AWARE

## 2021-06-28 NOTE — Telephone Encounter (Signed)
It looks like Dr. Harl Bowie most recently prescribed the Crestor 10 mg so I would stick with that 1, discontinue the 5 mg

## 2021-06-29 LAB — CMP14+EGFR
ALT: 8 IU/L (ref 0–32)
AST: 15 IU/L (ref 0–40)
Albumin/Globulin Ratio: 1.1 — ABNORMAL LOW (ref 1.2–2.2)
Albumin: 3.9 g/dL (ref 3.7–4.7)
Alkaline Phosphatase: 95 IU/L (ref 44–121)
BUN/Creatinine Ratio: 12 (ref 12–28)
BUN: 22 mg/dL (ref 8–27)
Bilirubin Total: 0.3 mg/dL (ref 0.0–1.2)
CO2: 19 mmol/L — ABNORMAL LOW (ref 20–29)
Calcium: 9.7 mg/dL (ref 8.7–10.3)
Chloride: 106 mmol/L (ref 96–106)
Creatinine, Ser: 1.77 mg/dL — ABNORMAL HIGH (ref 0.57–1.00)
Globulin, Total: 3.4 g/dL (ref 1.5–4.5)
Glucose: 99 mg/dL (ref 70–99)
Potassium: 4.5 mmol/L (ref 3.5–5.2)
Sodium: 140 mmol/L (ref 134–144)
Total Protein: 7.3 g/dL (ref 6.0–8.5)
eGFR: 29 mL/min/{1.73_m2} — ABNORMAL LOW (ref >59)

## 2021-06-29 LAB — TSH: TSH: 0.277 u[IU]/mL — ABNORMAL LOW (ref 0.450–4.500)

## 2021-06-29 LAB — CBC WITH DIFFERENTIAL/PLATELET
Basophils Absolute: 0 10*3/uL (ref 0.0–0.2)
Basos: 0 %
EOS (ABSOLUTE): 0.1 10*3/uL (ref 0.0–0.4)
Eos: 1 %
Hematocrit: 34.2 % (ref 34.0–46.6)
Hemoglobin: 10.7 g/dL — ABNORMAL LOW (ref 11.1–15.9)
Immature Grans (Abs): 0 10*3/uL (ref 0.0–0.1)
Immature Granulocytes: 0 %
Lymphocytes Absolute: 2.6 10*3/uL (ref 0.7–3.1)
Lymphs: 38 %
MCH: 29.8 pg (ref 26.6–33.0)
MCHC: 31.3 g/dL — ABNORMAL LOW (ref 31.5–35.7)
MCV: 95 fL (ref 79–97)
Monocytes Absolute: 0.6 10*3/uL (ref 0.1–0.9)
Monocytes: 9 %
Neutrophils Absolute: 3.5 10*3/uL (ref 1.4–7.0)
Neutrophils: 52 %
Platelets: 308 10*3/uL (ref 150–450)
RBC: 3.59 x10E6/uL — ABNORMAL LOW (ref 3.77–5.28)
RDW: 13.4 % (ref 11.7–15.4)
WBC: 6.9 10*3/uL (ref 3.4–10.8)

## 2021-06-29 LAB — LIPID PANEL
Chol/HDL Ratio: 3.9 ratio (ref 0.0–4.4)
Cholesterol, Total: 167 mg/dL (ref 100–199)
HDL: 43 mg/dL (ref >39)
LDL Chol Calc (NIH): 103 mg/dL — ABNORMAL HIGH (ref 0–99)
Triglycerides: 119 mg/dL (ref 0–149)
VLDL Cholesterol Cal: 21 mg/dL (ref 5–40)

## 2021-07-03 ENCOUNTER — Ambulatory Visit: Payer: Medicare Other | Admitting: Gastroenterology

## 2021-07-03 ENCOUNTER — Other Ambulatory Visit: Payer: Self-pay

## 2021-07-03 ENCOUNTER — Encounter: Payer: Self-pay | Admitting: Gastroenterology

## 2021-07-03 VITALS — BP 162/72 | HR 73 | Temp 97.5°F | Ht 64.0 in | Wt 238.2 lb

## 2021-07-03 DIAGNOSIS — K219 Gastro-esophageal reflux disease without esophagitis: Secondary | ICD-10-CM | POA: Diagnosis not present

## 2021-07-03 NOTE — Progress Notes (Signed)
Referring Provider: [name], [name], MD Primary Care Physician:  [name], [name], MD Primary GI: Dr. Gala Romney  Chief Complaint  Patient presents with   Gastroesophageal Reflux    Doing fine   Anemia    HPI:   Shelly [name] is an 81 y.o. female presenting today with a history of GERD, diverticular bleed several years ago, IDA with capsule study showing enteritis due to aspirin use, chronic GERD.Prefers frequent visits with health care team. Chronic normocytic anemia with chronic diease component.    Protonix just as needed. When taking it, this curbs her appetite. Rare nausea. No vomiting. She does not want to change her PPI. Feels like this is helpful for prn GERD. No abdominal pain. No dysphagia. No overt GI bleeding. She is feeling better in regards to post-herpetic neuralgia. Her mood is improved. She just had a surprise birthday party for her 81th birthday.    Past Medical History:  Diagnosis Date   Allergy    Anemia    GI bleed   Anginal pain ([place])    Anxiety    Arthritis    Blood transfusion without reported diagnosis    Cataract    Chronic kidney disease    Coronary artery disease    SEVERE   GERD (gastroesophageal reflux disease)    GI bleed 12/2013   Goiter    Hyperlipidemia    Hypertension    Myocardial infarct [facility])    Myocardial infarction ([place]) 08/2013   Neuralgia of right lower extremity 07/13/2017   Pancolonic diverticulosis 12/2013   Post herpetic neuralgia     Past Surgical History:  Procedure Laterality Date   CARPAL TUNNEL RELEASE Bilateral    COLONOSCOPY N/A 01/13/2014   Dr. Hung:pandiverticulosis    COLONOSCOPY N/A 12/14/2015   Dr. Michail Sermon: pancolonic diverticulosis, internal hemorrhoids    CORNEAL TRANSPLANT     CORONARY ANGIOPLASTY  08/2013   ESOPHAGOGASTRODUODENOSCOPY N/A 12/14/2015   Dr. Michail Sermon: normal    GIVENS CAPSULE STUDY N/A 08/13/2016   Dr. Oneida Alar: enteritis due to aspirin.    HEEL SPUR EXCISION     LEFT HEART  CATHETERIZATION WITH CORONARY ANGIOGRAM N/A 08/23/2013   Procedure: LEFT HEART CATHETERIZATION WITH CORONARY ANGIOGRAM;  Surgeon: Peter M Martinique, MD;  Location: [facility] CATH LAB;  Service: Cardiovascular;  Laterality: N/A;   PERCUTANEOUS CORONARY STENT INTERVENTION (PCI-S) N/A 08/24/2013   Procedure: PERCUTANEOUS CORONARY STENT INTERVENTION (PCI-S);  Surgeon: Peter M Martinique, MD;  Location: [facility] CATH LAB;  Service: Cardiovascular;  Laterality: N/A;   TONSILLECTOMY     TUBAL LIGATION      Current Outpatient Medications  Medication Sig Dispense Refill   amLODipine (NORVASC) 5 MG tablet TAKE ONE TABLET BY MOUTH ONCE DAILY 90 tablet 3   cetirizine (ZYRTEC) 10 MG tablet Take 10 mg by mouth daily.     chlorthalidone (HYGROTON) 25 MG tablet TAKE ONE TABLET BY MOUTH DAILY 90 tablet 0   clopidogrel (PLAVIX) 75 MG tablet TAKE ONE TABLET BY MOUTH DAILY 90 tablet 0   diclofenac Sodium (VOLTAREN) 1 % GEL Apply 2 g topically 4 (four) times daily. 350 g 3   furosemide (LASIX) 40 MG tablet Take 40 mg by mouth daily as needed (weight gain).     gabapentin (NEURONTIN) 100 MG capsule TAKE ONE CAPSULE BY MOUTH THREE TIMES DAILY 90 capsule 0   hydrocortisone (ANUSOL-HC) 2.5 % rectal cream Place 1 application rectally 2 (two) times daily. As needed for rectal discomfort. 30 g 1  lisinopril (ZESTRIL) 40 MG tablet TAKE ONE TABLET BY MOUTH DAILY 90 tablet 0   metoprolol succinate (TOPROL-XL) 50 MG 24 hr tablet TAKE ONE TABLET BY MOUTH DAILY *TAKE WITH OR IMMEDIATELY FOLLOWING A MEAL* 30 tablet 1   Multiple Vitamin (MULTIVITAMIN) tablet Take 1 tablet by mouth daily.     pantoprazole (PROTONIX) 40 MG tablet Take 1 tablet (40 mg total) by mouth as needed. 30 minutes before breakfast 90 tablet 0   rosuvastatin (CRESTOR) 10 MG tablet Take 1 tablet (10 mg total) by mouth daily. 90 tablet 3   rosuvastatin (CRESTOR) 5 MG tablet Take 5 mg by mouth daily.     triamcinolone (KENALOG) 0.025 % ointment Apply 1 application topically 2 (two)  times daily. 60 g 1   No current facility-administered medications for this visit.    Allergies as of 07/03/2021 - Review Complete 07/03/2021  Allergen Reaction Noted   Nsaids Other (See Comments) 07/25/2016   Atorvastatin Other (See Comments) 11/30/2018    Family History  Problem Relation Age of Onset   Hypertension Brother    Transient ischemic attack Brother    Cancer Mother        unsure of type    Other Father        grangrene    Asthma Sister    Stroke Sister    COPD Sister    Stroke Sister    Early death Brother    Early death Brother    Liver cancer Daughter    Cancer Daughter    Hernia Son        Umbilical   GI problems Son    Pancreatic disease Daughter        pancreatectomy   Cancer Daughter    Diverticulitis Daughter    Arthritis Daughter        knee    Arthritis Daughter        shoulder    Anemia Daughter    GER disease Son    Colon cancer Neg Hx    Colon polyps Neg Hx     Social History   Socioeconomic History   Marital status: Divorced    Spouse name: Not on file   Number of children: 8   Years of education: 11   Highest education level: 11th grade  Occupational History   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   No daily caffeine use.   Social Determinants of Health   Financial Resource Strain: Low Risk    Difficulty of Paying Living Expenses: Not very hard  Food Insecurity: No Food  Insecurity   Worried About Charity fundraiser in the Last Year: Never true   Ran Out of Food in the Last Year: Never true  Transportation Needs: No Transportation Needs   Lack of Transportation (Medical): No   Lack of  Transportation (Non-Medical): No  Physical Activity: Insufficiently Active   Days of Exercise per Week: 1 day   Minutes of Exercise per Session: 10 min  Stress: Stress Concern Present   Feeling of Stress : To some extent  Social Connections: Socially Isolated   Frequency of Communication with Friends and Family: More than three times a week   Frequency of Social Gatherings with Friends and Family: More than three times a week   Attends Religious Services: Never   Marine scientist or Organizations: No   Attends Archivist Meetings: Never   Marital Status: Widowed    Review of Systems: Gen: Denies fever, chills, anorexia. Denies fatigue, weakness, weight loss.  CV: Denies chest pain, palpitations, syncope, peripheral edema, and claudication. Resp: Denies dyspnea at rest, cough, wheezing, coughing up blood, and pleurisy. GI: see HPI Derm: Denies rash, itching, dry skin Psych: Denies depression, anxiety, memory loss, confusion. No homicidal or suicidal ideation.  Heme: Denies bruising, bleeding, and enlarged lymph nodes.  Physical Exam: BP (!) 162/72    Pulse 73    Temp (!) 97.5 F (36.4 C)    Ht 5\' 4"  (1.626 m)    Wt 238 lb 3.2 oz (108 kg)    BMI 40.89 kg/m  General:   Alert and oriented. No distress noted. Pleasant and cooperative.  Head:  Normocephalic and atraumatic. Eyes:  Conjuctiva clear without scleral icterus. Mouth: mask in place Abdomen:  +BS, soft, non-tender and non-distended. No rebound or guarding. No HSM or masses noted. Msk:  Symmetrical without gross deformities. Normal posture. Extremities:  With lymphedema Neurologic:  Alert and  oriented x4 Psych:  Alert and cooperative. Normal mood and affect.  ASSESSMENT: DOMINICK ZERTUCHE  is an 81 y.o. female presenting today in follow-up for chronic GERD.   Symptoms well controlled on prn Protonix. She has no alarm signs/symptoms.   Chronic normocytic anemia: stable. Chronic disease component.  Although doing well, she prefers visit every 3 months.   PLAN:  Continue pantoprazole prn Return in 3 months  Annitta Needs, PhD, [facility] Gastroenterology

## 2021-07-03 NOTE — Patient Instructions (Signed)
Continue to take pantoprazole (Protonix) just as needed.  I will see you in 3 months.  Happy Birthday!   I enjoyed seeing you again today! As you know, I value our relationship and want to provide genuine, compassionate, and quality care. I welcome your feedback. If you receive a survey regarding your visit,  I greatly appreciate you taking time to fill this out. See you next time!  Annitta Needs, PhD, ANP-BC [facility] Gastroenterology

## 2021-07-10 DIAGNOSIS — I129 Hypertensive chronic kidney disease with stage 1 through stage 4 chronic kidney disease, or unspecified chronic kidney disease: Secondary | ICD-10-CM | POA: Diagnosis not present

## 2021-07-10 DIAGNOSIS — D638 Anemia in other chronic diseases classified elsewhere: Secondary | ICD-10-CM | POA: Diagnosis not present

## 2021-07-10 DIAGNOSIS — R809 Proteinuria, unspecified: Secondary | ICD-10-CM | POA: Diagnosis not present

## 2021-07-10 DIAGNOSIS — N184 Chronic kidney disease, stage 4 (severe): Secondary | ICD-10-CM | POA: Diagnosis not present

## 2021-07-10 DIAGNOSIS — I5032 Chronic diastolic (congestive) heart failure: Secondary | ICD-10-CM | POA: Diagnosis not present

## 2021-07-11 DIAGNOSIS — R809 Proteinuria, unspecified: Secondary | ICD-10-CM | POA: Diagnosis not present

## 2021-07-11 DIAGNOSIS — I5032 Chronic diastolic (congestive) heart failure: Secondary | ICD-10-CM | POA: Diagnosis not present

## 2021-07-11 DIAGNOSIS — D638 Anemia in other chronic diseases classified elsewhere: Secondary | ICD-10-CM | POA: Diagnosis not present

## 2021-07-11 DIAGNOSIS — I129 Hypertensive chronic kidney disease with stage 1 through stage 4 chronic kidney disease, or unspecified chronic kidney disease: Secondary | ICD-10-CM | POA: Diagnosis not present

## 2021-07-11 DIAGNOSIS — N184 Chronic kidney disease, stage 4 (severe): Secondary | ICD-10-CM | POA: Diagnosis not present

## 2021-07-12 ENCOUNTER — Encounter: Payer: Self-pay | Admitting: *Deleted

## 2021-07-18 ENCOUNTER — Other Ambulatory Visit: Payer: Self-pay | Admitting: Cardiology

## 2021-07-24 DIAGNOSIS — E0590 Thyrotoxicosis, unspecified without thyrotoxic crisis or storm: Secondary | ICD-10-CM | POA: Diagnosis not present

## 2021-07-25 ENCOUNTER — Other Ambulatory Visit: Payer: Self-pay

## 2021-07-25 ENCOUNTER — Encounter: Payer: Self-pay | Admitting: Nurse Practitioner

## 2021-07-25 ENCOUNTER — Ambulatory Visit (INDEPENDENT_AMBULATORY_CARE_PROVIDER_SITE_OTHER): Payer: Medicare Other | Admitting: Nurse Practitioner

## 2021-07-25 VITALS — BP 139/78 | HR 74 | Ht 64.0 in | Wt 238.0 lb

## 2021-07-25 DIAGNOSIS — E0590 Thyrotoxicosis, unspecified without thyrotoxic crisis or storm: Secondary | ICD-10-CM | POA: Diagnosis not present

## 2021-07-25 LAB — T3, FREE: T3, Free: 2.7 pg/mL (ref 2.0–4.4)

## 2021-07-25 LAB — T4, FREE: Free T4: 0.96 ng/dL (ref 0.82–1.77)

## 2021-07-25 LAB — TSH: TSH: 0.195 u[IU]/mL — ABNORMAL LOW (ref 0.450–4.500)

## 2021-07-25 NOTE — Progress Notes (Signed)
07/25/2021, 1:58 PM                                     Endocrinology follow-up note    Subjective:    Patient ID: Shelly [name], female    DOB: 09/15/1940, PCP [name], [name], MD   Past Medical History:  Diagnosis Date   Allergy    Anemia    GI bleed   Anginal pain ([place])    Anxiety    Arthritis    Blood transfusion without reported diagnosis    Cataract    Chronic kidney disease    Coronary artery disease    SEVERE   GERD (gastroesophageal reflux disease)    GI bleed 12/2013   Goiter    Hyperlipidemia    Hypertension    Myocardial infarct [facility])    Myocardial infarction ([place]) 08/2013   Neuralgia of right lower extremity 07/13/2017   Pancolonic diverticulosis 12/2013   Post herpetic neuralgia    Past Surgical History:  Procedure Laterality Date   CARPAL TUNNEL RELEASE Bilateral    COLONOSCOPY N/A 01/13/2014   Dr. Hung:pandiverticulosis    COLONOSCOPY N/A 12/14/2015   Dr. Michail Sermon: pancolonic diverticulosis, internal hemorrhoids    CORNEAL TRANSPLANT     CORONARY ANGIOPLASTY  08/2013   ESOPHAGOGASTRODUODENOSCOPY N/A 12/14/2015   Dr. Michail Sermon: normal    GIVENS CAPSULE STUDY N/A 08/13/2016   Dr. Oneida Alar: enteritis due to aspirin.    HEEL SPUR EXCISION     LEFT HEART CATHETERIZATION WITH CORONARY ANGIOGRAM N/A 08/23/2013   Procedure: LEFT HEART CATHETERIZATION WITH CORONARY ANGIOGRAM;  Surgeon: Peter M Martinique, MD;  Location: [facility] CATH LAB;  Service: Cardiovascular;  Laterality: N/A;   PERCUTANEOUS CORONARY STENT INTERVENTION (PCI-S) N/A 08/24/2013   Procedure: PERCUTANEOUS CORONARY STENT INTERVENTION (PCI-S);  Surgeon: Peter M Martinique, MD;  Location: [facility] CATH LAB;  Service: Cardiovascular;  Laterality: N/A;   TONSILLECTOMY     TUBAL LIGATION     Social History   Socioeconomic History   Marital status: Divorced    Spouse name: Not on file   Number of children: 8   Years of education: 11   Highest education level:  11th grade  Occupational History   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   No daily caffeine use.   Social Determinants of Health   Financial Resource Strain: Low Risk    Difficulty of Paying Living Expenses: Not very hard  Food Insecurity: No Food Insecurity   Worried About Running Out  of Food in the Last Year: Never true   [place] in the Last Year: Never true  Transportation Needs: No Transportation Needs   Lack of Transportation (Medical): No   Lack of Transportation (Non-Medical): No  Physical Activity: Insufficiently Active   Days of Exercise per Week: 1 day   Minutes of Exercise per Session: 10 min  Stress: Stress Concern Present   Feeling of Stress : To some extent  Social Connections: Socially Isolated   Frequency of Communication with Friends and Family: More than three times a week   Frequency of Social Gatherings with Friends and Family: More than three times a week   Attends Religious Services: Never   Marine scientist or Organizations: No   Attends Archivist Meetings: Never   Marital Status: Widowed   Family History  Problem Relation Age of Onset   Hypertension Brother    Transient ischemic attack Brother    Cancer Mother        unsure of type    Other Father        grangrene    Asthma Sister    Stroke Sister    COPD Sister    Stroke Sister    Early death Brother    Early death Brother     Liver cancer Daughter    Cancer Daughter    Hernia Son        Umbilical   GI problems Son    Pancreatic disease Daughter        pancreatectomy   Cancer Daughter    Diverticulitis Daughter    Arthritis Daughter        knee    Arthritis Daughter        shoulder    Anemia Daughter    GER disease Son    Colon cancer Neg Hx    Colon polyps Neg Hx    Outpatient Encounter Medications as of 07/25/2021  Medication Sig   amLODipine (NORVASC) 5 MG tablet TAKE ONE TABLET BY MOUTH ONCE DAILY   cetirizine (ZYRTEC) 10 MG tablet Take 10 mg by mouth daily.   chlorthalidone (HYGROTON) 25 MG tablet TAKE ONE TABLET BY MOUTH DAILY   clopidogrel (PLAVIX) 75 MG tablet TAKE ONE TABLET BY MOUTH DAILY   diclofenac Sodium (VOLTAREN) 1 % GEL Apply 2 g topically 4 (four) times daily.   furosemide (LASIX) 40 MG tablet Take 40 mg by mouth daily as needed (weight gain).   gabapentin (NEURONTIN) 100 MG capsule TAKE ONE CAPSULE BY MOUTH THREE TIMES DAILY   hydrocortisone (ANUSOL-HC) 2.5 % rectal cream Place 1 application rectally 2 (two) times daily. As needed for rectal discomfort.   lisinopril (ZESTRIL) 40 MG tablet TAKE ONE TABLET BY MOUTH DAILY   metoprolol succinate (TOPROL-XL) 50 MG 24 hr tablet TAKE ONE TABLET BY MOUTH DAILY *TAKE WITH OR IMMEDIATELY FOLLOWING A MEAL*   Multiple Vitamin (MULTIVITAMIN) tablet Take 1 tablet by mouth daily.   pantoprazole (PROTONIX) 40 MG tablet Take 1 tablet (40 mg total) by mouth as needed. 30 minutes before breakfast   rosuvastatin (CRESTOR) 10 MG tablet Take 1 tablet (10 mg total) by mouth daily.   triamcinolone (KENALOG) 0.025 % ointment Apply 1 application topically 2 (two) times daily.   [DISCONTINUED] rosuvastatin (CRESTOR) 5 MG tablet Take 5 mg by mouth daily.   No facility-administered encounter medications on file as of 07/25/2021.   ALLERGIES: Allergies  Allergen Reactions   Nsaids Other (See Comments)  AVOID all NSAID due to history of GI bleeding    Atorvastatin Other (See Comments)    Joint & muscle pain    VACCINATION STATUS: Immunization History  Administered Date(s) Administered   Pneumococcal Conjugate-13 10/02/2016   Pneumococcal Polysaccharide-23 08/25/2013   Zoster Recombinat (Shingrix) 03/17/2018    Thyroid Problem Presents for follow-up visit. Patient reports no anxiety, cold intolerance, constipation, depressed mood, fatigue, heat intolerance, leg swelling, palpitations, tremors, weight gain or weight loss. The symptoms have been stable.   KORINE WINTON is 81 y.o. female who is seen in follow-up after she was seen in consultation for subclinical hyperthyroidism.    PMD: [name], [name], MD.  Due to septic symptoms of thyrotoxicosis, she was started on low-dose methimazole, which was since tapered down.  She has been off the Methimazole for some time now with stable thyroid response.  She denies any history of goiter, no family history of thyroid dysfunction or thyroid malignancy.  Her thyroid uptake and scan on June 14, 2019 showed 24-hour uptake of 10.3% which is normal.  There was asymmetric thyroid activity with generally increased uptake in the right lobe.  No focal hot or cold nodules identified.  Her medical history includes hypertension and hyperlipidemia on treatment.  Review of systems  Constitutional: + Minimally fluctuating body weight,  current Body mass index is 40.85 kg/m. , no fatigue, no subjective hyperthermia, no subjective hypothermia Eyes: no blurry vision, no xerophthalmia ENT: no sore throat, no nodules palpated in throat, no dysphagia/odynophagia, no hoarseness Cardiovascular: no chest pain, no shortness of breath, no palpitations, no leg swelling Respiratory: no cough, no shortness of breath Gastrointestinal: no nausea/vomiting/diarrhea Musculoskeletal: no muscle/joint aches Skin: no rashes, no hyperemia Neurological: no tremors, no numbness, no tingling, no  dizziness Psychiatric: no depression, no anxiety  Objective:    BP 139/78    Pulse 74    Ht 5\' 4"  (1.626 m)    Wt 238 lb (108 kg)    BMI 40.85 kg/m   Wt Readings from Last 3 Encounters:  07/25/21 238 lb (108 kg)  07/03/21 238 lb 3.2 oz (108 kg)  06/27/21 239 lb (108.4 kg)    BP Readings from Last 3 Encounters:  07/25/21 139/78  07/03/21 (!) 162/72  06/27/21 136/75    Physical Exam- Limited  Constitutional:  Body mass index is 40.85 kg/m. , not in acute distress, normal state of mind Eyes:  EOMI, no exophthalmos Neck: Supple Cardiovascular: RRR, no murmurs, rubs, or gallops, no edema Respiratory: Adequate breathing efforts, no crackles, rales, rhonchi, or wheezing Musculoskeletal: no gross deformities, strength intact in all four extremities, no gross restriction of joint movements Skin:  no rashes, no hyperemia Neurological: no tremor with outstretched hands    CMP     Component Value Date/Time   NA 140 06/27/2021 1214   K 4.5 06/27/2021 1214   CL 106 06/27/2021 1214   CO2 19 (L) 06/27/2021 1214   GLUCOSE 99 06/27/2021 1214   GLUCOSE 114 (H) 04/05/2021 1319   BUN 22 06/27/2021 1214   CREATININE 1.77 (H) 06/27/2021 1214   CREATININE 1.31 (H) 12/21/2018 1547   CALCIUM 9.7 06/27/2021 1214   PROT 7.3 06/27/2021 1214   ALBUMIN 3.9 06/27/2021 1214   AST 15 06/27/2021 1214   ALT 8 06/27/2021 1214   ALKPHOS 95 06/27/2021 1214   BILITOT 0.3 06/27/2021 1214   GFRNONAA 32 (L) 04/05/2021 1319   GFRAA 34 (L) 04/30/2020 1510     Diabetic Labs (most  recent): Lab Results  Component Value Date   HGBA1C 6.0 (H) 12/09/2015   HGBA1C 6.0 (H) 08/23/2013     Lipid Panel ( most recent) Lipid Panel     Component Value Date/Time   CHOL 167 06/27/2021 1214   TRIG 119 06/27/2021 1214   HDL 43 06/27/2021 1214   CHOLHDL 3.9 06/27/2021 1214   CHOLHDL 2.6 11/20/2016 0854   VLDL 11 11/20/2016 0854   LDLCALC 103 (H) 06/27/2021 1214   LABVLDL 21 06/27/2021 1214      Lab  Results  Component Value Date   TSH 0.195 (L) 07/24/2021   TSH 0.277 (L) 06/27/2021   TSH 0.224 (L) 04/02/2021   TSH 0.120 (L) 02/08/2021   TSH 0.161 (L) 10/16/2020   TSH 0.227 (L) 07/04/2020   TSH 0.227 (L) 05/03/2020   TSH 0.218 (L) 04/30/2020   TSH 0.384 (L) 01/31/2020   TSH 0.55 01/02/2020   FREET4 0.96 07/24/2021   FREET4 0.88 04/02/2021   FREET4 1.02 10/16/2020   FREET4 1.00 07/04/2020   FREET4 0.97 05/03/2020   FREET4 1.0 01/02/2020   FREET4 0.9 09/23/2019   FREET4 0.9 05/30/2019   FREET4 1.04 12/11/2015   FREET4 1.09 10/19/2013     Latest Reference Range & Units 10/16/20 14:40 02/08/21 13:52 04/02/21 13:48 06/27/21 12:14 07/24/21 15:43  TSH 0.450 - 4.500 uIU/mL 0.161 (L) 0.120 (L) 0.224 (L) 0.277 (L) 0.195 (L)  Triiodothyronine,Free,Serum 2.0 - 4.4 pg/mL 2.7  2.4  2.7  T4,Free(Direct) 0.82 - 1.77 ng/dL 1.02  0.88  0.96  (L): Data is abnormally low  Assessment & Plan:   1. Subclinical hyperthyroidism  -Her most recent thyroid function tests are stable, TSH still minimally suppressed but FT3 and FT4 are normal.  There is no need for her to restart Methimazole at this time.  Will monitor TFTs again in 3 months (patient prefers to have more frequent checkups than the 6 month I offered her).    - she is advised to maintain close follow up with [name], [name], MD for primary care needs.   I spent 20 minutes in the care of the patient today including review of labs from Thyroid Function, CMP, and other relevant labs ; imaging/biopsy records (current and previous including abstractions from other facilities); face-to-face time discussing  her lab results and symptoms, medications doses, her options of short and long term treatment based on the latest standards of care / guidelines;   and documenting the encounter.  Shelly [name]  participated in the discussions, expressed understanding, and voiced agreement with the above plans.  All questions were answered to her  satisfaction. she is encouraged to contact clinic should she have any questions or concerns prior to her return visit.   Follow up plan: Return in about 3 months (around 10/25/2021) for Thyroid follow up, Previsit labs.   Rayetta Pigg, [facility] [address] [place], [place] [address] Phone: [phone] Fax: [phone]   07/25/2021, 1:58 PM

## 2021-08-03 ENCOUNTER — Other Ambulatory Visit: Payer: Self-pay | Admitting: Family Medicine

## 2021-08-03 DIAGNOSIS — B0229 Other postherpetic nervous system involvement: Secondary | ICD-10-CM

## 2021-08-07 ENCOUNTER — Telehealth: Payer: Self-pay

## 2021-08-07 ENCOUNTER — Ambulatory Visit (INDEPENDENT_AMBULATORY_CARE_PROVIDER_SITE_OTHER): Payer: Medicare Other | Admitting: Licensed Clinical Social Worker

## 2021-08-07 DIAGNOSIS — K219 Gastro-esophageal reflux disease without esophagitis: Secondary | ICD-10-CM

## 2021-08-07 DIAGNOSIS — N1832 Chronic kidney disease, stage 3b: Secondary | ICD-10-CM

## 2021-08-07 DIAGNOSIS — I252 Old myocardial infarction: Secondary | ICD-10-CM

## 2021-08-07 DIAGNOSIS — D508 Other iron deficiency anemias: Secondary | ICD-10-CM

## 2021-08-07 DIAGNOSIS — E782 Mixed hyperlipidemia: Secondary | ICD-10-CM

## 2021-08-07 DIAGNOSIS — F419 Anxiety disorder, unspecified: Secondary | ICD-10-CM

## 2021-08-07 DIAGNOSIS — I2510 Atherosclerotic heart disease of native coronary artery without angina pectoris: Secondary | ICD-10-CM

## 2021-08-07 DIAGNOSIS — I10 Essential (primary) hypertension: Secondary | ICD-10-CM

## 2021-08-07 NOTE — Progress Notes (Signed)
? ?Cardiology Office Note   ? ?Date:  08/08/2021  ? ?ID:  Shelly [name], DOB 1940/10/07, MRN [id number] ? ?PCP:  [name], [name], MD  ?Cardiologist: Carlyle Dolly, MD   ? ?Chief Complaint  ?Patient presents with  ? Follow-up  ?  Overdue Visit  ? ? ?History of Present Illness:   ? ?Shelly [name] is a 81 y.o. female with past medical history of CAD (s/p NSTEMI in 2015 with DES x2 to RCA), HTN, HLD, Stage III CKD and lower extremity edema who presents to the office today for overdue follow-up. ? ?She was last examined by Dr. Harl Bowie in 08/2020 and denied any recent anginal symptoms at that time. She was taking Lasix approximately twice per week to help with her lower extremity edema and was continued on her current medication regimen. ? ?In talking with the patient today, she reports overall doing well from a cardiac perspective since her last office visit. She denies any recent chest pain or palpitations. She does have occasional dyspnea on exertion but no acute change in her symptoms. No recent orthopnea or PND. She does experience intermittent lower extremity edema and takes Lasix approximately 2 to 3 days each week. Does not take daily due to frequent urination. She does report having a syncopal episode since her last visit which occurred after dose adjustment of Gabapentin by her PCP. She has since resumed this at a lower dose and has overall tolerated well. ? ?Past Medical History:  ?Diagnosis Date  ? Allergy   ? Anemia   ? GI bleed  ? Anginal pain ([place])   ? Anxiety   ? Arthritis   ? Blood transfusion without reported diagnosis   ? CAD (coronary artery disease)   ? a. s/p NSTEMI in 2015 with DES x2 to RCA  ? Cataract   ? Chronic kidney disease   ? Coronary artery disease   ? SEVERE  ? GERD (gastroesophageal reflux disease)   ? GI bleed 12/2013  ? Goiter   ? Hyperlipidemia   ? Hypertension   ? Myocardial infarct [facility])   ? Myocardial infarction [facility]) 08/2013  ? Neuralgia of right lower extremity  07/13/2017  ? Pancolonic diverticulosis 12/2013  ? Post herpetic neuralgia   ? ? ?Past Surgical History:  ?Procedure Laterality Date  ? CARPAL TUNNEL RELEASE Bilateral   ? COLONOSCOPY N/A 01/13/2014  ? Dr. Hung:pandiverticulosis   ? COLONOSCOPY N/A 12/14/2015  ? Dr. Michail Sermon: pancolonic diverticulosis, internal hemorrhoids   ? CORNEAL TRANSPLANT    ? CORONARY ANGIOPLASTY  08/2013  ? ESOPHAGOGASTRODUODENOSCOPY N/A 12/14/2015  ? Dr. Michail Sermon: normal   ? GIVENS CAPSULE STUDY N/A 08/13/2016  ? Dr. Oneida Alar: enteritis due to aspirin.   ? HEEL SPUR EXCISION    ? LEFT HEART CATHETERIZATION WITH CORONARY ANGIOGRAM N/A 08/23/2013  ? Procedure: LEFT HEART CATHETERIZATION WITH CORONARY ANGIOGRAM;  Surgeon: Peter M Martinique, MD;  Location: [facility] CATH LAB;  Service: Cardiovascular;  Laterality: N/A;  ? PERCUTANEOUS CORONARY STENT INTERVENTION (PCI-S) N/A 08/24/2013  ? Procedure: PERCUTANEOUS CORONARY STENT INTERVENTION (PCI-S);  Surgeon: Peter M Martinique, MD;  Location: [facility] CATH LAB;  Service: Cardiovascular;  Laterality: N/A;  ? TONSILLECTOMY    ? TUBAL LIGATION    ? ? ?Current Medications: ?Outpatient Medications Prior to Visit  ?Medication Sig Dispense Refill  ? amLODipine (NORVASC) 5 MG tablet TAKE ONE TABLET BY MOUTH ONCE DAILY 90 tablet 3  ? betamethasone valerate (VALISONE) 0.1 % cream Apply 1 application. topically 2 (two)  times daily. prn 30 g 1  ? cetirizine (ZYRTEC) 10 MG tablet Take 10 mg by mouth daily.    ? chlorthalidone (HYGROTON) 25 MG tablet TAKE ONE TABLET BY MOUTH DAILY 90 tablet 0  ? clopidogrel (PLAVIX) 75 MG tablet TAKE ONE TABLET BY MOUTH DAILY 90 tablet 0  ? diclofenac Sodium (VOLTAREN) 1 % GEL Apply 2 g topically 4 (four) times daily. 350 g 3  ? furosemide (LASIX) 40 MG tablet Take 40 mg by mouth daily as needed (weight gain).    ? gabapentin (NEURONTIN) 100 MG capsule TAKE ONE CAPSULE BY MOUTH THREE TIMES DAILY 90 capsule 1  ? hydrocortisone (ANUSOL-HC) 2.5 % rectal cream Place 1 application rectally 2 (two) times daily.  As needed for rectal discomfort. 30 g 1  ? lisinopril (ZESTRIL) 40 MG tablet TAKE ONE TABLET BY MOUTH DAILY 90 tablet 0  ? Multiple Vitamin (MULTIVITAMIN) tablet Take 1 tablet by mouth daily.    ? pantoprazole (PROTONIX) 40 MG tablet Take 1 tablet (40 mg total) by mouth as needed. 30 minutes before breakfast 90 tablet 0  ? triamcinolone (KENALOG) 0.025 % ointment Apply 1 application topically 2 (two) times daily. 60 g 1  ? metoprolol succinate (TOPROL-XL) 50 MG 24 hr tablet TAKE ONE TABLET BY MOUTH DAILY *TAKE WITH OR IMMEDIATELY FOLLOWING A MEAL* 30 tablet 0  ? rosuvastatin (CRESTOR) 10 MG tablet Take 1 tablet (10 mg total) by mouth daily. 90 tablet 3  ? ?No facility-administered medications prior to visit.  ?  ? ?Allergies:   Nsaids and Atorvastatin  ? ?Social History  ? ?Socioeconomic History  ? Marital status: Divorced  ?  Spouse name: Not on file  ? Number of children: 8  ? Years of education: 85  ? Highest education level: 11th grade  ?Occupational History  ? Occupation: Social worker (retired)  ?  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 Smoking status: Never  ? Smokeless tobacco: Never  ?Vaping Use  ? Vaping Use: Never used  ?Substance and Sexual Activity  ? Alcohol use: No  ?  Alcohol/week: 0.0 standard drinks  ? Drug use: No  ? Sexual activity: Not Currently  ?  Birth control/protection: Surgical  ?  Comment: divorced, lives with daughter and granddaughters  ?Other Topics Concern  ? Not on file  ?Social History Narrative  ? 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 Lives with her 2 granddaughters   ? Right-handed.  ? No daily caffeine use.  ? 2 story home but she only has to use one level  ? ?Social Determinants of Health  ? ?Financial Resource Strain: Low Risk   ? Difficulty of Paying Living Expenses: Not hard at all  ?Food Insecurity: No Food Insecurity  ? Worried About Charity fundraiser in the Last Year: Never true  ? Ran Out of Food in the Last Year: Never true  ?Transportation Needs: No Transportation Needs  ?  Lack of Transportation (Medical): No  ? Lack of Transportation (Non-Medical): No  ?Physical Activity: Insufficiently Active  ? Days of Exercise per Week: 7 days  ? Minutes of Exercise per Session: 10 min  ?Stress: Stress Concern Present  ? Feeling of Stress : To some extent  ?Social Connections: Socially Isolated  ? Frequency of Communication with Friends and Family: More than three times a week  ? Frequency of Social Gatherings with Friends and Family: More than three times a week  ? Attends Religious Services: Never  ? Active  Member of Clubs or Organizations: No  ? Attends Archivist Meetings: Never  ? Marital Status: Widowed  ?  ? ?Family History:  The patient's family history includes Anemia in her daughter; Arthritis in her daughter and daughter; Asthma in her sister; COPD in her sister; Cancer in her daughter, daughter, and mother; Diverticulitis in her daughter; Early death in her brother and brother; GER disease in her son; GI problems in her son; Hernia in her son; Hypertension in her brother; Liver cancer in her daughter; Other in her father; Pancreatic disease in her daughter; Stroke in her sister and sister; Transient ischemic attack in her brother.  ? ?Review of Systems:   ? ?Please see the history of present illness.    ? ?All other systems reviewed and are otherwise negative except as noted above. ? ? ?Physical Exam:   ? ?VS:  BP (!) 142/60   Pulse 88   Wt 236 lb (107 kg)   SpO2 96%   BMI 40.51 kg/m?    ?General: Pleasant elderly female appearing in no acute distress. ?Head: Normocephalic, atraumatic. ?Neck: No carotid bruits. JVD not elevated.  ?Lungs: Respirations regular and unlabored, without wheezes or rales.  ?Heart: Regular rate and rhythm. No S3 or S4.  No murmur, no rubs, or gallops appreciated. ?Abdomen: Appears non-distended. No obvious abdominal masses. ?Msk:  Strength and tone appear normal for age. No obvious joint deformities or effusions. ?Extremities: No clubbing or  cyanosis. Trace lower extremity edema bilaterally.  Distal pedal pulses are 2+ bilaterally. ?Neuro: Alert and oriented X 3. Moves all extremities spontaneously. No focal deficits noted. ?Psych:  Responds to quest

## 2021-08-07 NOTE — Patient Instructions (Addendum)
Visit Information ? ?Patient Goals  Manage My Emotions (Patient). Manage Grief issues faced. Manage depression issues faced ? ?imeframe:  Short-Term Goal ?Priority:  Medium ?Progress: On Track ?Start Date:  08/07/21                            ?Expected End Date:  10/31/21                    ? ?Follow Up Date  10/01/21 at 4:00 PM ?  ?Manage My Emotions (Patient) Manage Grief issues faced ; Manage depression issues faced ?  ?Why is this important?   ?When you are stressed, down or upset, your body reacts too.  ?For example, your blood pressure may get higher; you may have a headache or stomachache.  ?When your emotions get the best of you, your body's ability to fight off cold and flu gets weak.  ?These steps will help you manage your emotions.    ? ?Patient Self Care Activities:  ?Eats meals independently ?Takes medications as prescribed ?Attends scheduled medical appointments ? ?Patient Coping Strengths:  ?Completes ADLs ?Completes IADLS ?Makes transport arrangements as needed ?Attends medical appointments ?Communicates with LCSW or RNCM as needed for CCM support ? ?Patient Self Care Deficits:  ?Grief issues ?Depression issues ? ?Patient Goals:  ?- spend time or talk with others every day ?- practice relaxation or meditation daily ?- keep a calendar with appointment dates ?Talk with RNCM or LCSW as needed for support ? ?Follow Up Plan: LCSW to call client on 10/01/21 at 4:00 PM to assess client needs  ? ?Norva Riffle.Gerilyn Stargell MSW, LCSW ?Licensed Clinical Social Worker ?[facility] ?[phone] ?

## 2021-08-07 NOTE — Chronic Care Management (AMB) (Signed)
?Chronic Care Management  ? ? Clinical Social Work Note ? ?08/07/2021 ?Name: Shelly [name] MRN: [id number] DOB: [birth year] ? ?Shelly [name] is a 81 y.o. year old female who is a primary care patient of [name], Shelly Kaufmann, MD. The CCM team was consulted to assist the patient with chronic disease management and/or care coordination needs related to: [employer] .  ? ?Engaged with patient by telephone for follow up visit in response to provider referral for social work chronic care management and care coordination services.  ? ?Consent to Services:  ?The patient was given information about Chronic Care Management services, agreed to services, and gave verbal consent prior to initiation of services.  Please see initial visit note for detailed documentation.  ? ?Patient agreed to services and consent obtained.  ? ?Assessment: Review of patient past medical history, allergies, medications, and health status, including review of relevant consultants reports was performed today as part of a comprehensive evaluation and provision of chronic care management and care coordination services.    ? ?SDOH (Social Determinants of Health) assessments and interventions performed:  ?SDOH Interventions   ? ?Flowsheet Row Most Recent Value  ?SDOH Interventions   ?Physical Activity Interventions Other (Comments)  [walking challenges. she uses a walker to help her walk]  ?Stress Interventions Provide Counseling  [client has stress related to managing medical needs]  ?Depression Interventions/Treatment  Counseling  ? ?  ?  ? ?Advanced Directives Status: See Vynca application for related entries. ? ?CCM Care Plan ? ?Allergies  ?Allergen Reactions  ? Nsaids Other (See Comments)  ?  AVOID all NSAID due to history of GI bleeding  ? Atorvastatin Other (See Comments)  ?  Joint & muscle pain  ? ? ?Outpatient Encounter Medications as of 08/07/2021  ?Medication Sig  ? amLODipine (NORVASC) 5 MG tablet TAKE ONE TABLET BY MOUTH ONCE DAILY   ? cetirizine (ZYRTEC) 10 MG tablet Take 10 mg by mouth daily.  ? chlorthalidone (HYGROTON) 25 MG tablet TAKE ONE TABLET BY MOUTH DAILY  ? clopidogrel (PLAVIX) 75 MG tablet TAKE ONE TABLET BY MOUTH DAILY  ? diclofenac Sodium (VOLTAREN) 1 % GEL Apply 2 g topically 4 (four) times daily.  ? furosemide (LASIX) 40 MG tablet Take 40 mg by mouth daily as needed (weight gain).  ? gabapentin (NEURONTIN) 100 MG capsule TAKE ONE CAPSULE BY MOUTH THREE TIMES DAILY  ? hydrocortisone (ANUSOL-HC) 2.5 % rectal cream Place 1 application rectally 2 (two) times daily. As needed for rectal discomfort.  ? lisinopril (ZESTRIL) 40 MG tablet TAKE ONE TABLET BY MOUTH DAILY  ? metoprolol succinate (TOPROL-XL) 50 MG 24 hr tablet TAKE ONE TABLET BY MOUTH DAILY *TAKE WITH OR IMMEDIATELY FOLLOWING A MEAL*  ? Multiple Vitamin (MULTIVITAMIN) tablet Take 1 tablet by mouth daily.  ? pantoprazole (PROTONIX) 40 MG tablet Take 1 tablet (40 mg total) by mouth as needed. 30 minutes before breakfast  ? rosuvastatin (CRESTOR) 10 MG tablet Take 1 tablet (10 mg total) by mouth daily.  ? triamcinolone (KENALOG) 0.025 % ointment Apply 1 application topically 2 (two) times daily.  ? ?No facility-administered encounter medications on file as of 08/07/2021.  ? ? ?Patient Active Problem List  ? Diagnosis Date Noted  ? Subclinical hyperthyroidism 06/06/2019  ? Normocytic anemia 06/02/2019  ? Postherpetic neuralgia 09/28/2018  ? Herpes zoster without complication 02/08/3006  ? Iron deficiency anemia 08/27/2016  ? Morbid obesity ([place]) 08/27/2016  ? CKD (chronic kidney disease), stage III ([place]) 04/04/2016  ? Osteoarthritis  left knee 04/04/2016  ? Third degree heart block ([place]) 12/10/2015  ? Pancolonic diverticulosis 12/09/2015  ? CAD (coronary artery disease) 01/11/2014  ? History of non-ST elevation myocardial infarction (NSTEMI) 08/23/2013  ? IDA (iron deficiency anemia) 08/23/2013  ? Essential hypertension 08/22/2013  ? GERD (gastroesophageal reflux disease)  08/22/2013  ? Anxiety 08/22/2013  ? ? ?Conditions to be addressed/monitored: monitor client management of anxiety issues and of grief issues ? ?Care Plan : LCSW Care Plan  ?Updates made by Shelly Cabal, LCSW since 08/07/2021 12:00 AM  ?  ? ?Problem: Depression Identification (Depression)   ?  ? ?Goal: Depressive Symptoms Identified;Manage Depression symptoms and manage grief symptoms   ?Start Date: 08/07/2021  ?Expected End Date: 11/04/2021  ?This Visit's Progress: On track  ?Recent Progress: On track  ?Priority: Medium  ?Note:   ?Current Barriers:  ?Chronic Mental Health needs related to anxiety issues and grief issues ?Suicidal Ideation/Homicidal Ideation: No ?Depression issues ?Mobility issues ?Pain issues ? ?Clinical Social Work Goal(s):  ?patient will work with SW monthly by telephone or in person to reduce or manage symptoms related to anxiety and grief issues of client ?patient will attend all scheduled medical appointments as evidenced by patient report and care team review of appointment completion in EMR:   ?Patient will talk with [facility] as needed in next 30 days for nursing support ? ?Interventions: ? ?1:1 collaboration with Dr. [name] [name] regarding development and update of comprehensive plan of care as evidenced by provider attestation and co-signature ?Discussed knee pain issues with client. She said she has knee pain occasionally in her left knee.  ?Discussed ambulation of client (client has a walker to use as needed; client also has a cane to use as needed to help her walk) ?Provided counseling support for client ?Discussed client issues with left hand. Client said she had a knot on her left hand and was having difficulty bending two of her left fingers.  She also said she was out of a medication for a rash.  LCSW informed Shelly [name] that LCSW would call [facility] Triage Nurse today and ask Triage nurse to call client today.  Client agreed to this plan ?Discussed appetite of client and sleeping  issues of client ?Reviewed mood of client. She said she is sad occasionally. She is taking medications as prescribed. She talks regularly with her children and this is helpful ?Discussed falls history. She said she if fearful of falling. She has not been using her stove to cook as much because she has a fear of falling ?Encouraged client to call RNCM as needed for nursing support for client. ?Following client phone call with LCSW, LCSW called Jan, Triage Nurse at [facility] and asked Jan to call client today to discuss client needs. Jan, Triage  Nurse, said she would call client today to talk of client needs ? ?Patient Self Care Activities:  ?Eats meals independently ?Takes medications as prescribed ?Attends scheduled medical appointments ? ?Patient Coping Strengths:  ?Completes ADLs ?Completes IADLS ?Makes transport arrangements as needed ?Attends medical appointments ?Communicates with LCSW or RNCM as needed for CCM support ? ?Patient Self Care Deficits:  ?Grief issues ?Depression issues ? ?Patient Goals:  ?- spend time or talk with others every day ?- practice relaxation or meditation daily ?- keep a calendar with appointment dates ?Talk with RNCM or LCSW as needed for support ? ?Follow Up Plan: LCSW to call client on  10/01/21 at 4:00 PM to assess client needs  ?  ?  ?  Norva Riffle.Devetta Hagenow MSW, LCSW ?Licensed Clinical Social Worker ?[facility] ?[phone] ?

## 2021-08-07 NOTE — Telephone Encounter (Signed)
Received call from Theadore Nan asking me to help patient schedule an appointment to see Dr. Warrick Parisian for pain in left hand and med refills.  Contacted patient and left message to call back. ?

## 2021-08-08 ENCOUNTER — Ambulatory Visit (INDEPENDENT_AMBULATORY_CARE_PROVIDER_SITE_OTHER): Payer: Medicare Other | Admitting: Student

## 2021-08-08 ENCOUNTER — Other Ambulatory Visit: Payer: Self-pay

## 2021-08-08 ENCOUNTER — Encounter: Payer: Self-pay | Admitting: Student

## 2021-08-08 ENCOUNTER — Ambulatory Visit (INDEPENDENT_AMBULATORY_CARE_PROVIDER_SITE_OTHER): Payer: Medicare Other

## 2021-08-08 VITALS — BP 142/60 | HR 88 | Wt 236.0 lb

## 2021-08-08 VITALS — Wt 238.0 lb

## 2021-08-08 DIAGNOSIS — I2510 Atherosclerotic heart disease of native coronary artery without angina pectoris: Secondary | ICD-10-CM

## 2021-08-08 DIAGNOSIS — R600 Localized edema: Secondary | ICD-10-CM | POA: Diagnosis not present

## 2021-08-08 DIAGNOSIS — E785 Hyperlipidemia, unspecified: Secondary | ICD-10-CM

## 2021-08-08 DIAGNOSIS — Z0000 Encounter for general adult medical examination without abnormal findings: Secondary | ICD-10-CM

## 2021-08-08 DIAGNOSIS — I10 Essential (primary) hypertension: Secondary | ICD-10-CM

## 2021-08-08 DIAGNOSIS — N1832 Chronic kidney disease, stage 3b: Secondary | ICD-10-CM

## 2021-08-08 MED ORDER — METOPROLOL SUCCINATE ER 50 MG PO TB24: 50.0000 mg | ORAL_TABLET | Freq: Every day | ORAL | 3 refills | Status: DC

## 2021-08-08 MED ORDER — ROSUVASTATIN CALCIUM 20 MG PO TABS: 20.0000 mg | ORAL_TABLET | Freq: Every day | ORAL | 3 refills | Status: DC

## 2021-08-08 MED ORDER — BETAMETHASONE VALERATE 0.1 % EX CREA: 1.0000 "application " | TOPICAL_CREAM | Freq: Two times a day (BID) | CUTANEOUS | 1 refills | Status: DC

## 2021-08-08 NOTE — Progress Notes (Signed)
? ?Subjective:  ? Shelly [name] is a 81 y.o. female who presents for Medicare Annual (Subsequent) preventive examination. ? ?Virtual Visit via Telephone Note ? ?I connected with  Shelly [name] on 08/08/21 at  9:45 AM EDT by telephone and verified that I am speaking with the correct person using two identifiers. ? ?Location: ?Patient: Home ?Provider: [facility] ?Persons participating in the virtual visit: patient/Nurse Health Advisor ?  ?I discussed the limitations, risks, security and privacy concerns of performing an evaluation and management service by telephone and the availability of in person appointments. The patient expressed understanding and agreed to proceed. ? ?Interactive audio and video telecommunications were attempted between this nurse and patient, however failed, due to patient having technical difficulties OR patient did not have access to video capability.  We continued and completed visit with audio only. ? ?Some vital signs may be absent or patient reported.  ? ?Shelly Aguiniga Dionne Ano, LPN  ? ?Review of Systems    ? ?Cardiac Risk Factors include: advanced age (>45men, >13 women);sedentary lifestyle;obesity (BMI >30kg/m2);hypertension;Other (see comment), Risk factor comments: CAD, 3rd degree heart block, hx of MI, anemia ? ?   ?Objective:  ?  ?Today's Vitals  ? 08/08/21 0947  ?Weight: 238 lb (108 kg)  ?PainSc: 4   ? ?Body mass index is 40.85 kg/m?. ? ? ?  08/08/2021  ? 10:16 AM 01/22/2021  ?  3:57 PM 08/07/2020  ? 10:22 AM 08/03/2019  ?  9:00 AM 08/02/2018  ?  8:30 AM 11/18/2016  ?  9:20 PM 10/02/2016  ? 10:04 AM  ?Advanced Directives  ?Does Patient Have a Medical Advance Directive? Yes Yes Yes Yes Yes Yes No  ?Type of Paramedic of [place];Living will  [place];Living will [place];Living will Living will;Healthcare Power of Attorney Out of facility DNR (pink MOST or yellow form) Out of facility DNR (pink MOST or yellow form)  ?Does patient want  to make changes to medical advance directive?   No - Patient declined No - Patient declined  No - Patient declined   ?Copy of [place] in Chart? Yes - validated most recent copy scanned in chart (See row information)  No - copy requested Yes - validated most recent copy scanned in chart (See row information) No - copy requested    ?Would patient like information on creating a medical advance directive?       Yes (MAU/Ambulatory/Procedural Areas - Information given)  ?Pre-existing out of facility DNR order (yellow form or pink MOST form)      Yellow form placed in chart (order not valid for inpatient use) Yellow form placed in chart (order not valid for inpatient use)  ? ? ?Current Medications (verified) ?Outpatient Encounter Medications as of 08/08/2021  ?Medication Sig  ? amLODipine (NORVASC) 5 MG tablet TAKE ONE TABLET BY MOUTH ONCE DAILY  ? cetirizine (ZYRTEC) 10 MG tablet Take 10 mg by mouth daily.  ? chlorthalidone (HYGROTON) 25 MG tablet TAKE ONE TABLET BY MOUTH DAILY  ? clopidogrel (PLAVIX) 75 MG tablet TAKE ONE TABLET BY MOUTH DAILY  ? diclofenac Sodium (VOLTAREN) 1 % GEL Apply 2 g topically 4 (four) times daily.  ? furosemide (LASIX) 40 MG tablet Take 40 mg by mouth daily as needed (weight gain).  ? gabapentin (NEURONTIN) 100 MG capsule TAKE ONE CAPSULE BY MOUTH THREE TIMES DAILY  ? hydrocortisone (ANUSOL-HC) 2.5 % rectal cream Place 1 application rectally 2 (two) times daily.  As needed for rectal discomfort.  ? lisinopril (ZESTRIL) 40 MG tablet TAKE ONE TABLET BY MOUTH DAILY  ? metoprolol succinate (TOPROL-XL) 50 MG 24 hr tablet TAKE ONE TABLET BY MOUTH DAILY *TAKE WITH OR IMMEDIATELY FOLLOWING A MEAL*  ? Multiple Vitamin (MULTIVITAMIN) tablet Take 1 tablet by mouth daily.  ? pantoprazole (PROTONIX) 40 MG tablet Take 1 tablet (40 mg total) by mouth as needed. 30 minutes before breakfast  ? rosuvastatin (CRESTOR) 10 MG tablet Take 1 tablet (10 mg total) by mouth daily.  ? triamcinolone  (KENALOG) 0.025 % ointment Apply 1 application topically 2 (two) times daily.  ? ?No facility-administered encounter medications on file as of 08/08/2021.  ? ? ?Allergies (verified) ?Nsaids and Atorvastatin  ? ?History: ?Past Medical History:  ?Diagnosis Date  ? Allergy   ? Anemia   ? GI bleed  ? Anginal pain ([place])   ? Anxiety   ? Arthritis   ? Blood transfusion without reported diagnosis   ? Cataract   ? Chronic kidney disease   ? Coronary artery disease   ? SEVERE  ? GERD (gastroesophageal reflux disease)   ? GI bleed 12/2013  ? Goiter   ? Hyperlipidemia   ? Hypertension   ? Myocardial infarct [facility])   ? Myocardial infarction [facility]) 08/2013  ? Neuralgia of right lower extremity 07/13/2017  ? Pancolonic diverticulosis 12/2013  ? Post herpetic neuralgia   ? ?Past Surgical History:  ?Procedure Laterality Date  ? CARPAL TUNNEL RELEASE Bilateral   ? COLONOSCOPY N/A 01/13/2014  ? Dr. Hung:pandiverticulosis   ? COLONOSCOPY N/A 12/14/2015  ? Dr. Michail Sermon: pancolonic diverticulosis, internal hemorrhoids   ? CORNEAL TRANSPLANT    ? CORONARY ANGIOPLASTY  08/2013  ? ESOPHAGOGASTRODUODENOSCOPY N/A 12/14/2015  ? Dr. Michail Sermon: normal   ? GIVENS CAPSULE STUDY N/A 08/13/2016  ? Dr. Oneida Alar: enteritis due to aspirin.   ? HEEL SPUR EXCISION    ? LEFT HEART CATHETERIZATION WITH CORONARY ANGIOGRAM N/A 08/23/2013  ? Procedure: LEFT HEART CATHETERIZATION WITH CORONARY ANGIOGRAM;  Surgeon: Peter M Martinique, MD;  Location: [facility] CATH LAB;  Service: Cardiovascular;  Laterality: N/A;  ? PERCUTANEOUS CORONARY STENT INTERVENTION (PCI-S) N/A 08/24/2013  ? Procedure: PERCUTANEOUS CORONARY STENT INTERVENTION (PCI-S);  Surgeon: Peter M Martinique, MD;  Location: [facility] CATH LAB;  Service: Cardiovascular;  Laterality: N/A;  ? TONSILLECTOMY    ? TUBAL LIGATION    ? ?Family History  ?Problem Relation Age of Onset  ? Hypertension Brother   ? Transient ischemic attack Brother   ? Cancer Mother   ?     unsure of type   ? Other Father   ?     grangrene   ? Asthma Sister   ? Stroke  Sister   ? COPD Sister   ? Stroke Sister   ? Early death Brother   ? Early death Brother   ? Liver cancer Daughter   ? Cancer Daughter   ? Hernia Son   ?     Umbilical  ? GI problems Son   ? Pancreatic disease Daughter   ?     pancreatectomy  ? Cancer Daughter   ? Diverticulitis Daughter   ? Arthritis Daughter   ?     knee   ? Arthritis Daughter   ?     shoulder   ? Anemia Daughter   ? GER disease Son   ? Colon cancer Neg Hx   ? Colon polyps Neg Hx   ? ?Social History  ? ?  Socioeconomic History  ? Marital status: Divorced  ?  Spouse name: Not on file  ? Number of children: 8  ? Years of education: 67  ? Highest education level: 11th grade  ?Occupational History  ? Occupation: Social worker (retired)  ?  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 Smoking status: Never  ? Smokeless tobacco: Never  ?Vaping Use  ? Vaping Use: Never used  ?Substance and Sexual Activity  ? Alcohol use: No  ?  Alcohol/week: 0.0 standard drinks  ? Drug use: No  ? Sexual activity: Not Currently  ?  Birth control/protection: Surgical  ?  Comment: divorced, lives with daughter and granddaughters  ?Other Topics Concern  ? Not on file  ?Social History Narrative  ? 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 Lives with her 2 granddaughters   ? Right-handed.  ? No daily caffeine use.  ? 2 story home but she only has to use one level  ? ?Social Determinants of Health  ? ?Financial Resource Strain: Low Risk   ? Difficulty of Paying Living Expenses: Not hard at all  ?Food Insecurity: No Food Insecurity  ? Worried About Charity fundraiser in the Last Year: Never true  ? Ran Out of Food in the Last Year: Never true  ?Transportation Needs: No Transportation Needs  ? Lack of Transportation (Medical): No  ? Lack of Transportation (Non-Medical): No  ?Physical Activity: Insufficiently Active  ? Days of Exercise per Week: 7 days  ? Minutes of Exercise per Session: 10 min  ?Stress: Stress Concern Present  ? Feeling of Stress : To some extent  ?Social Connections: Socially  Isolated  ? Frequency of Communication with Friends and Family: More than three times a week  ? Frequency of Social Gatherings with Friends and Family: More than three times a week  ? Attends Religious

## 2021-08-08 NOTE — Patient Instructions (Signed)
Shelly [name] , ?Thank you for taking time to come for your Medicare Wellness Visit. I appreciate your ongoing commitment to your health goals. Please review the following plan we discussed and let me know if I can assist you in the future.  ? ?Screening recommendations/referrals: ?Colonoscopy: Done 01/13/2014 - no repeat required ?Mammogram: no longer required ?Bone Density: Done 08/02/2018 - Repeat every 2-5 years  ?Recommended yearly ophthalmology/optometry visit for glaucoma screening and checkup ?Recommended yearly dental visit for hygiene and checkup ? ?Vaccinations: ?Influenza vaccine: Declined - recommended every fall  ?Pneumococcal vaccine: Done 08/25/2013 & 10/02/2016 ?Tdap vaccine: Due - recommended every 10 years ?Shingles vaccine: Done 03/17/2018 - declines booster   ?Covid-19: Declined ? ?Advanced directives: in chart ? ?Conditions/risks identified: Aim for 30 minutes of exercise or walking, 6-8 glasses of water, and 5 servings of fruits and vegetables each day.  ? ?Next appointment: Follow up in one year for your annual wellness visit  ? ? ?Preventive Care 27 Years and Older, Female ?Preventive care refers to lifestyle choices and visits with your health care provider that can promote health and wellness. ?What does preventive care include? ?A yearly physical exam. This is also called an annual well check. ?Dental exams once or twice a year. ?Routine eye exams. Ask your health care provider how often you should have your eyes checked. ?Personal lifestyle choices, including: ?Daily care of your teeth and gums. ?Regular physical activity. ?Eating a healthy diet. ?Avoiding tobacco and drug use. ?Limiting alcohol use. ?Practicing safe sex. ?Taking low-dose aspirin every day. ?Taking vitamin and mineral supplements as recommended by your health care provider. ?What happens during an annual well check? ?The services and screenings done by your health care provider during your annual well check will depend on your  age, overall health, lifestyle risk factors, and family history of disease. ?Counseling  ?Your health care provider may ask you questions about your: ?Alcohol use. ?Tobacco use. ?Drug use. ?Emotional well-being. ?Home and relationship well-being. ?Sexual activity. ?Eating habits. ?History of falls. ?Memory and ability to understand (cognition). ?Work and work Statistician. ?Reproductive health. ?Screening  ?You may have the following tests or measurements: ?Height, weight, and BMI. ?Blood pressure. ?Lipid and cholesterol levels. These may be checked every 5 years, or more frequently if you are over 70 years old. ?Skin check. ?Lung cancer screening. You may have this screening every year starting at age 66 if you have a 30-pack-year history of smoking and currently smoke or have quit within the past 15 years. ?Fecal occult blood test (FOBT) of the stool. You may have this test every year starting at age 57. ?Flexible sigmoidoscopy or colonoscopy. You may have a sigmoidoscopy every 5 years or a colonoscopy every 10 years starting at age 7. ?Hepatitis C blood test. ?Hepatitis B blood test. ?Sexually transmitted disease (STD) testing. ?Diabetes screening. This is done by checking your blood sugar (glucose) after you have not eaten for a while (fasting). You may have this done every 1-3 years. ?Bone density scan. This is done to screen for osteoporosis. You may have this done starting at age 17. ?Mammogram. This may be done every 1-2 years. Talk to your health care provider about how often you should have regular mammograms. ?Talk with your health care provider about your test results, treatment options, and if necessary, the need for more tests. ?Vaccines  ?Your health care provider may recommend certain vaccines, such as: ?Influenza vaccine. This is recommended every year. ?Tetanus, diphtheria, and acellular pertussis (Tdap, Td)  vaccine. You may need a Td booster every 10 years. ?Zoster vaccine. You may need this after  age 54. ?Pneumococcal 13-valent conjugate (PCV13) vaccine. One dose is recommended after age 39. ?Pneumococcal polysaccharide (PPSV23) vaccine. One dose is recommended after age 75. ?Talk to your health care provider about which screenings and vaccines you need and how often you need them. ?This information is not intended to replace advice given to you by your health care provider. Make sure you discuss any questions you have with your health care provider. ?Document Released: 06/01/2015 Document Revised: 01/23/2016 Document Reviewed: 03/06/2015 ?Elsevier Interactive Patient Education ? 2017 [place]. ? ?Fall Prevention in the Home ?Falls can cause injuries. They can happen to people of all ages. There are many things you can do to make your home safe and to help prevent falls. ?What can I do on the outside of my home? ?Regularly fix the edges of walkways and driveways and fix any cracks. ?Remove anything that might make you trip as you walk through a door, such as a raised step or threshold. ?Trim any bushes or trees on the path to your home. ?Use bright outdoor lighting. ?Clear any walking paths of anything that might make someone trip, such as rocks or tools. ?Regularly check to see if handrails are loose or broken. Make sure that both sides of any steps have handrails. ?Any raised decks and porches should have guardrails on the edges. ?Have any leaves, snow, or ice cleared regularly. ?Use sand or salt on walking paths during winter. ?Clean up any spills in your garage right away. This includes oil or grease spills. ?What can I do in the bathroom? ?Use night lights. ?Install grab bars by the toilet and in the tub and shower. Do not use towel bars as grab bars. ?Use non-skid mats or decals in the tub or shower. ?If you need to sit down in the shower, use a plastic, non-slip stool. ?Keep the floor dry. Clean up any water that spills on the floor as soon as it happens. ?Remove soap buildup in the tub or shower  regularly. ?Attach bath mats securely with double-sided non-slip rug tape. ?Do not have throw rugs and other things on the floor that can make you trip. ?What can I do in the bedroom? ?Use night lights. ?Make sure that you have a light by your bed that is easy to reach. ?Do not use any sheets or blankets that are too big for your bed. They should not hang down onto the floor. ?Have a firm chair that has side arms. You can use this for support while you get dressed. ?Do not have throw rugs and other things on the floor that can make you trip. ?What can I do in the kitchen? ?Clean up any spills right away. ?Avoid walking on wet floors. ?Keep items that you use a lot in easy-to-reach places. ?If you need to reach something above you, use a strong step stool that has a grab bar. ?Keep electrical cords out of the way. ?Do not use floor polish or wax that makes floors slippery. If you must use wax, use non-skid floor wax. ?Do not have throw rugs and other things on the floor that can make you trip. ?What can I do with my stairs? ?Do not leave any items on the stairs. ?Make sure that there are handrails on both sides of the stairs and use them. Fix handrails that are broken or loose. Make sure that handrails are as long  as the stairways. ?Check any carpeting to make sure that it is firmly attached to the stairs. Fix any carpet that is loose or worn. ?Avoid having throw rugs at the top or bottom of the stairs. If you do have throw rugs, attach them to the floor with carpet tape. ?Make sure that you have a light switch at the top of the stairs and the bottom of the stairs. If you do not have them, ask someone to add them for you. ?What else can I do to help prevent falls? ?Wear shoes that: ?Do not have high heels. ?Have rubber bottoms. ?Are comfortable and fit you well. ?Are closed at the toe. Do not wear sandals. ?If you use a stepladder: ?Make sure that it is fully opened. Do not climb a closed stepladder. ?Make sure that  both sides of the stepladder are locked into place. ?Ask someone to hold it for you, if possible. ?Clearly mark and make sure that you can see: ?Any grab bars or handrails. ?First and last steps. ?W

## 2021-08-08 NOTE — Patient Instructions (Signed)
Medication Instructions:  ? ?Increase Crestor to 20 mg Daily  ? ?*If you need a refill on your cardiac medications before your next appointment, please call your pharmacy* ? ? ?Lab Work: ?Your physician recommends that you return for lab work in: 3 Months ( Lipid) ? ?If you have labs (blood work) drawn today and your tests are completely normal, you will receive your results only by: ?MyChart Message (if you have MyChart) OR ?A paper copy in the mail ?If you have any lab test that is abnormal or we need to change your treatment, we will call you to review the results. ? ? ?Testing/Procedures: ?NONE  ? ? ?Follow-Up: ?At [facility], you and your health needs are our priority.  As part of our continuing mission to provide you with exceptional heart care, we have created designated Provider Care Teams.  These Care Teams include your primary Cardiologist (physician) and Advanced Practice Providers (APPs -  Physician Assistants and Nurse Practitioners) who all work together to provide you with the care you need, when you need it. ? ?We recommend signing up for the patient portal called "MyChart".  Sign up information is provided on this After Visit Summary.  MyChart is used to connect with patients for Virtual Visits (Telemedicine).  Patients are able to view lab/test results, encounter notes, upcoming appointments, etc.  Non-urgent messages can be sent to your provider as well.   ?To learn more about what you can do with MyChart, go to [email or web address].   ? ?Your next appointment:   ?6 month(s) ? ?The format for your next appointment:   ?In Person ? ?Provider:   ?Carlyle Dolly, MD  ? ? ?Other Instructions ?Thank you for choosing [facility]! ?  ? ? ?

## 2021-08-08 NOTE — Telephone Encounter (Signed)
She has Betamethasone 0.1% cream to use on her bottom prn - unsure which provider prescribed this. Wants to know if you can refill - uses [facility] in [place], [place] ?

## 2021-08-13 ENCOUNTER — Other Ambulatory Visit: Payer: Self-pay | Admitting: Family Medicine

## 2021-08-13 DIAGNOSIS — N1832 Chronic kidney disease, stage 3b: Secondary | ICD-10-CM

## 2021-08-13 DIAGNOSIS — I10 Essential (primary) hypertension: Secondary | ICD-10-CM

## 2021-08-16 DIAGNOSIS — I10 Essential (primary) hypertension: Secondary | ICD-10-CM

## 2021-08-16 DIAGNOSIS — I2510 Atherosclerotic heart disease of native coronary artery without angina pectoris: Secondary | ICD-10-CM

## 2021-08-16 DIAGNOSIS — I252 Old myocardial infarction: Secondary | ICD-10-CM

## 2021-08-16 DIAGNOSIS — E782 Mixed hyperlipidemia: Secondary | ICD-10-CM

## 2021-08-16 DIAGNOSIS — D508 Other iron deficiency anemias: Secondary | ICD-10-CM

## 2021-08-22 DIAGNOSIS — R208 Other disturbances of skin sensation: Secondary | ICD-10-CM | POA: Diagnosis not present

## 2021-08-22 DIAGNOSIS — L218 Other seborrheic dermatitis: Secondary | ICD-10-CM | POA: Diagnosis not present

## 2021-09-16 ENCOUNTER — Other Ambulatory Visit: Payer: Self-pay | Admitting: Family Medicine

## 2021-09-16 DIAGNOSIS — B0229 Other postherpetic nervous system involvement: Secondary | ICD-10-CM

## 2021-09-19 ENCOUNTER — Other Ambulatory Visit: Payer: Self-pay | Admitting: Family Medicine

## 2021-09-19 DIAGNOSIS — N1832 Chronic kidney disease, stage 3b: Secondary | ICD-10-CM

## 2021-09-19 DIAGNOSIS — I10 Essential (primary) hypertension: Secondary | ICD-10-CM

## 2021-09-25 ENCOUNTER — Encounter: Payer: Self-pay | Admitting: Family Medicine

## 2021-09-25 ENCOUNTER — Ambulatory Visit: Payer: Medicare Other | Admitting: Family Medicine

## 2021-09-27 ENCOUNTER — Ambulatory Visit: Payer: Medicare Other | Admitting: Family Medicine

## 2021-10-01 ENCOUNTER — Ambulatory Visit (INDEPENDENT_AMBULATORY_CARE_PROVIDER_SITE_OTHER): Payer: Medicare Other | Admitting: Licensed Clinical Social Worker

## 2021-10-01 DIAGNOSIS — F419 Anxiety disorder, unspecified: Secondary | ICD-10-CM

## 2021-10-01 DIAGNOSIS — I10 Essential (primary) hypertension: Secondary | ICD-10-CM

## 2021-10-01 DIAGNOSIS — I252 Old myocardial infarction: Secondary | ICD-10-CM

## 2021-10-01 DIAGNOSIS — D508 Other iron deficiency anemias: Secondary | ICD-10-CM

## 2021-10-01 DIAGNOSIS — K219 Gastro-esophageal reflux disease without esophagitis: Secondary | ICD-10-CM

## 2021-10-01 DIAGNOSIS — N1832 Chronic kidney disease, stage 3b: Secondary | ICD-10-CM

## 2021-10-01 DIAGNOSIS — M1712 Unilateral primary osteoarthritis, left knee: Secondary | ICD-10-CM

## 2021-10-01 DIAGNOSIS — I2510 Atherosclerotic heart disease of native coronary artery without angina pectoris: Secondary | ICD-10-CM

## 2021-10-01 NOTE — Chronic Care Management (AMB) (Signed)
Chronic Care Management    Clinical Social Work Note  10/01/2021 Name: Shelly [name] MRN: [id number] DOB: 1940/11/01  Shelly [name] is a 81 y.o. year old female who is a primary care patient of [name], [name], MD. The CCM team was consulted to assist the patient with chronic disease management and/or care coordination needs related to: [employer] .   Engaged with patient by telephone for follow up visit in response to provider referral for social work chronic care management and care coordination services.   Consent to Services:  The patient was given information about Chronic Care Management services, agreed to services, and gave verbal consent prior to initiation of services.  Please see initial visit note for detailed documentation.   Patient agreed to services and consent obtained.   Assessment: Review of patient past medical history, allergies, medications, and health status, including review of relevant consultants reports was performed today as part of a comprehensive evaluation and provision of chronic care management and care coordination services.     SDOH (Social Determinants of Health) assessments and interventions performed:  SDOH Interventions    Flowsheet Row Most Recent Value  SDOH Interventions   Physical Activity Interventions Other (Comments)  [walking challenges. She uses a cane or a walker to help her walk]  Stress Interventions Provide Counseling  [client has stress related to managing medical conditions]        Advanced Directives Status: See Vynca application for related entries.  CCM Care Plan  Allergies  Allergen Reactions   Nsaids Other (See Comments)    AVOID all NSAID due to history of GI bleeding   Atorvastatin Other (See Comments)    Joint & muscle pain    Outpatient Encounter Medications as of 10/01/2021  Medication Sig   amLODipine (NORVASC) 5 MG tablet TAKE ONE TABLET BY MOUTH ONCE DAILY   betamethasone valerate  (VALISONE) 0.1 % cream Apply 1 application. topically 2 (two) times daily. prn   cetirizine (ZYRTEC) 10 MG tablet Take 10 mg by mouth daily.   chlorthalidone (HYGROTON) 25 MG tablet TAKE ONE TABLET BY MOUTH DAILY   clopidogrel (PLAVIX) 75 MG tablet TAKE ONE TABLET BY MOUTH DAILY   diclofenac Sodium (VOLTAREN) 1 % GEL Apply 2 g topically 4 (four) times daily.   furosemide (LASIX) 40 MG tablet Take 40 mg by mouth daily as needed (weight gain).   gabapentin (NEURONTIN) 100 MG capsule TAKE ONE CAPSULE BY MOUTH THREE TIMES DAILY   hydrocortisone (ANUSOL-HC) 2.5 % rectal cream Place 1 application rectally 2 (two) times daily. As needed for rectal discomfort.   lisinopril (ZESTRIL) 40 MG tablet TAKE ONE TABLET BY MOUTH DAILY   metoprolol succinate (TOPROL-XL) 50 MG 24 hr tablet Take 1 tablet (50 mg total) by mouth daily. Take with or immediately following a meal.   Multiple Vitamin (MULTIVITAMIN) tablet Take 1 tablet by mouth daily.   pantoprazole (PROTONIX) 40 MG tablet Take 1 tablet (40 mg total) by mouth as needed. 30 minutes before breakfast   rosuvastatin (CRESTOR) 20 MG tablet Take 1 tablet (20 mg total) by mouth daily.   triamcinolone (KENALOG) 0.025 % ointment Apply 1 application topically 2 (two) times daily.   No facility-administered encounter medications on file as of 10/01/2021.    Patient Active Problem List   Diagnosis Date Noted   Subclinical hyperthyroidism 06/06/2019   Normocytic anemia 06/02/2019   Postherpetic neuralgia 09/28/2018   Herpes zoster without complication 37/48/2707   Iron deficiency anemia 08/27/2016  Morbid obesity ([place]) 08/27/2016   CKD (chronic kidney disease), stage III ([place]) 04/04/2016   Osteoarthritis left knee 04/04/2016   Third degree heart block ([place]) 12/10/2015   Pancolonic diverticulosis 12/09/2015   CAD (coronary artery disease) 01/11/2014   History of non-ST elevation myocardial infarction (NSTEMI) 08/23/2013   IDA (iron deficiency anemia)  08/23/2013   Essential hypertension 08/22/2013   GERD (gastroesophageal reflux disease) 08/22/2013   Anxiety 08/22/2013    Conditions to be addressed/monitored: monitor client management of anxiety issues. Monitor client management of grief issues  Care Plan : LCSW Care Plan  Updates made by Katha Cabal, LCSW since 10/01/2021 12:00 AM     Problem: Depression Identification (Depression)      Goal: Depressive Symptoms Identified;Manage Depression symptoms and manage grief symptoms   Start Date: 08/07/2021  Expected End Date: 12/10/2021  This Visit's Progress: On track  Recent Progress: On track  Priority: Medium  Note:   Current Barriers:  Chronic Mental Health needs related to anxiety issues and grief issues Suicidal Ideation/Homicidal Ideation: No Depression issues Mobility issues Pain issues  Clinical Social Work Goal(s):  patient will work with SW monthly by telephone or in person to reduce or manage symptoms related to anxiety and grief issues of client patient will attend all scheduled medical appointments as evidenced by patient report and care team review of appointment completion in EMR:   Patient will talk with RNCM as needed in next 30 days for nursing support  Interventions:  1:1 collaboration with Dr. [name] [name] regarding development and update of comprehensive plan of care as evidenced by provider attestation and co-signature Discussed knee pain issues with client. She said she has knee pain occasionally in her left knee.  Discussed ambulation of client (client has a walker to use as needed; client also has a cane to use as needed to help her walk) Provided counseling support for client Discussed appetite of client . She said she has a reduced appetite. Reviewed sleeping issues of client. She said she has difficulty sleeping due to issues with Shingles Reviewed mood of client. She said she is sad occasionally. She is taking medications as prescribed.  She talks regularly with her children and this is helpful Discussed falls history. She said she is fearful of falling. She has not been using her stove to cook as much because she has a fear of falling Encouraged client to call RNCM as needed for nursing support for client. Reviewed edema issues. She said she has occasional swelling in her ankles or feet Reviewed transport needs. She said that her granddaughter transports client to and from client appointments and helps client to complete errands needed  Patient Self Care Activities:  Eats meals independently Takes medications as prescribed Attends scheduled medical appointments  Patient Coping Strengths:  Completes ADLs Completes IADLS Makes transport arrangements as needed Attends medical appointments Communicates with LCSW or RNCM as needed for CCM support  Patient Self Care Deficits:  Grief issues Depression issues  Patient Goals:  - spend time or talk with others every day - practice relaxation or meditation daily - keep a calendar with appointment dates Talk with RNCM or LCSW as needed for support  Follow Up Plan: LCSW to call client on  11/11/21 at 2:00 PM to assess client needs      Norva Riffle.Karlye Ihrig MSW, [place] [employer] representative [facility] Care Management [phone]

## 2021-10-01 NOTE — Patient Instructions (Addendum)
Visit Information ? ?Patient Goals:  Manage My Emotions (Patient).  [place] Grief issues faced. Manage depression issues faced ? ?Timeframe:  Short-Term Goal ?Priority:  Medium ?Progress: On Track ?Start Date:  08/07/21                            ?Expected End Date:  12/10/21                      ? ?Follow Up Date  11/11/21 at 2:00 PM ?  ?Manage My Emotions (Patient) Manage Grief issues faced ; Manage depression issues faced ?  ?Why is this important?   ?When you are stressed, down or upset, your body reacts too.  ?For example, your blood pressure may get higher; you may have a headache or stomachache.  ?When your emotions get the best of you, your body's ability to fight off cold and flu gets weak.  ?These steps will help you manage your emotions.    ? ?Patient Self Care Activities:  ?Eats meals independently ?Takes medications as prescribed ?Attends scheduled medical appointments ? ?Patient Coping Strengths:  ?Completes ADLs ?Completes IADLS ?Makes transport arrangements as needed ?Attends medical appointments ?Communicates with LCSW or RNCM as needed for CCM support ? ?Patient Self Care Deficits:  ?Grief issues ?Depression issues ? ?Patient Goals:  ?- spend time or talk with others every day ?- practice relaxation or meditation daily ?- keep a calendar with appointment dates ?Talk with RNCM or LCSW as needed for support ? ?Follow Up Plan: LCSW to call client on 11/11/21 at 2:00 PM to assess client needs  ? ?Norva Riffle.Jamarea Selner MSW, LCSW ?Licensed Clinical Social Worker ?[facility] ?[phone] ?

## 2021-10-08 ENCOUNTER — Encounter: Payer: Self-pay | Admitting: Family Medicine

## 2021-10-08 ENCOUNTER — Ambulatory Visit (INDEPENDENT_AMBULATORY_CARE_PROVIDER_SITE_OTHER): Payer: Medicare Other | Admitting: Family Medicine

## 2021-10-08 VITALS — BP 130/73 | HR 68 | Temp 98.5°F | Ht 64.0 in | Wt 235.0 lb

## 2021-10-08 DIAGNOSIS — L282 Other prurigo: Secondary | ICD-10-CM

## 2021-10-08 DIAGNOSIS — L853 Xerosis cutis: Secondary | ICD-10-CM | POA: Diagnosis not present

## 2021-10-08 MED ORDER — PREDNISONE 20 MG PO TABS: ORAL_TABLET | ORAL | 0 refills | Status: DC

## 2021-10-08 NOTE — Patient Instructions (Addendum)
Jar emollients such as CeraVe or Cetaphil Tea Tree Oil for Scalp

## 2021-10-08 NOTE — Progress Notes (Signed)
Subjective:  Patient ID: Shelly [name], female    DOB: 1940-07-09, 81 y.o.   MRN: [id number]  Patient Care Team: [name], [name], MD as PCP - General (Family Medicine) Harl Bowie, Alphonse Guild, MD as PCP - Cardiology (Cardiology) Ilean China, RN as Case Manager Forrest, Norva Riffle, LCSW as Social Worker (Licensed Clinical Social Worker) Blanca Friend, Royce Macadamia, [facility] as Pharmacist (Family Medicine) Liana Gerold, MD as Consulting Physician (Nephrology) Ahmed Prima Neta Ehlers as Physician Assistant (Physician Assistant) Brita Romp, NP as Nurse Practitioner (Endocrinology) Celestia Khat, [place] (Optometry)   Chief Complaint:  Rash   HPI: Shelly [name] is a 81 y.o. female presenting on 10/08/2021 for Rash   Pt presents today with complaints of ongoing pruritic rash to face, ears, neck, scalp, and arms. States she was seen previously and given steroid cream for the rash with some resolution. She has not been using jar emollients. She denies new medications, foods, personal hygiene products, or household products. Denies tick bite. No known triggers. No shortness of breath, voice change, trouble swallowing, chest pain, wheezing, stridor, palpitations, dizziness, or syncope.   Rash This is a recurrent problem. The current episode started more than 1 month ago. The problem has been waxing and waning since onset. The affected locations include the scalp, head, face, neck, left arm and right arm. The rash is characterized by dryness, redness and itchiness. She was exposed to nothing. Pertinent negatives include no anorexia, congestion, cough, diarrhea, eye pain, facial edema, fatigue, fever, joint pain, nail changes, rhinorrhea, shortness of breath, sore throat or vomiting. Past treatments include topical steroids and antihistamine. The treatment provided mild relief.     Relevant past medical, surgical, family, and social history reviewed and updated as indicated.  Allergies and  medications reviewed and updated. Data reviewed: Chart in Epic.   Past Medical History:  Diagnosis Date   Allergy    Anemia    GI bleed   Anginal pain ([place])    Anxiety    Arthritis    Blood transfusion without reported diagnosis    CAD (coronary artery disease)    a. s/p NSTEMI in 2015 with DES x2 to RCA   Cataract    Chronic kidney disease    Coronary artery disease    SEVERE   GERD (gastroesophageal reflux disease)    GI bleed 12/2013   Goiter    Hyperlipidemia    Hypertension    Myocardial infarct [facility])    Myocardial infarction ([place]) 08/2013   Neuralgia of right lower extremity 07/13/2017   Pancolonic diverticulosis 12/2013   Post herpetic neuralgia     Past Surgical History:  Procedure Laterality Date   CARPAL TUNNEL RELEASE Bilateral    COLONOSCOPY N/A 01/13/2014   Dr. Hung:pandiverticulosis    COLONOSCOPY N/A 12/14/2015   Dr. Michail Sermon: pancolonic diverticulosis, internal hemorrhoids    CORNEAL TRANSPLANT     CORONARY ANGIOPLASTY  08/2013   ESOPHAGOGASTRODUODENOSCOPY N/A 12/14/2015   Dr. Michail Sermon: normal    GIVENS CAPSULE STUDY N/A 08/13/2016   Dr. Oneida Alar: enteritis due to aspirin.    HEEL SPUR EXCISION     LEFT HEART CATHETERIZATION WITH CORONARY ANGIOGRAM N/A 08/23/2013   Procedure: LEFT HEART CATHETERIZATION WITH CORONARY ANGIOGRAM;  Surgeon: Peter M Martinique, MD;  Location: [facility] CATH LAB;  Service: Cardiovascular;  Laterality: N/A;   PERCUTANEOUS CORONARY STENT INTERVENTION (PCI-S) N/A 08/24/2013   Procedure: PERCUTANEOUS CORONARY STENT INTERVENTION (PCI-S);  Surgeon: Peter M Martinique, MD;  Location:  [place] CATH LAB;  Service: Cardiovascular;  Laterality: N/A;   TONSILLECTOMY     TUBAL LIGATION      Social History   Socioeconomic History   Marital status: Divorced    Spouse name: Not on file   Number of children: 8   Years of education: 11   Highest education level: 11th grade  Occupational History   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   No daily caffeine use.   2 story home but she only has to use one level   Social Determinants of Radio broadcast assistant Strain: Low Risk    Difficulty of Paying Living Expenses: Not hard at all  Food Insecurity: No Food Insecurity   Worried About Charity fundraiser in the Last Year: Never true   Ran Out of Food in the Last Year: Never true  Transportation Needs: No Transportation Needs   Lack of Transportation (Medical): No   Lack of Transportation (Non-Medical): No  Physical Activity: Inactive   Days of Exercise per Week: 0 days   Minutes of Exercise per Session: 0 min  Stress: Stress Concern Present   Feeling of Stress : Rather much  Social Connections: Socially Isolated   Frequency of Communication with Friends and Family: More than three times a week   Frequency of Social Gatherings with Friends and Family: More than three times a week   Attends Religious Services: Never   Marine scientist or Organizations: No   Attends Archivist Meetings: Never   Marital Status: Widowed  Intimate Partner Violence: Not At Risk   Fear of Current or Ex-Partner: No   Emotionally Abused: No   Physically Abused: No   Sexually Abused: No    Outpatient Encounter Medications as of 10/08/2021  Medication Sig   amLODipine (NORVASC) 5 MG tablet TAKE ONE TABLET BY MOUTH ONCE DAILY   betamethasone valerate (VALISONE) 0.1 % cream Apply 1 application. topically 2 (two) times daily. prn    cetirizine (ZYRTEC) 10 MG tablet Take 10 mg by mouth daily.   chlorthalidone (HYGROTON) 25 MG tablet TAKE ONE TABLET BY MOUTH DAILY   clopidogrel (PLAVIX) 75 MG tablet TAKE ONE TABLET BY MOUTH DAILY   diclofenac Sodium (VOLTAREN) 1 % GEL Apply 2 g topically 4 (four) times daily.   furosemide (LASIX) 40 MG tablet Take 40 mg by mouth daily as needed (weight gain).   gabapentin (NEURONTIN) 100 MG capsule TAKE ONE CAPSULE BY MOUTH THREE TIMES DAILY   hydrocortisone (ANUSOL-HC) 2.5 % rectal cream Place 1 application rectally 2 (two) times daily. As needed for rectal discomfort.   lisinopril (ZESTRIL) 40 MG tablet TAKE ONE TABLET BY MOUTH DAILY   metoprolol succinate (TOPROL-XL) 50 MG 24 hr tablet Take 1 tablet (50 mg total) by mouth daily. Take with or immediately following a meal.   Multiple Vitamin (MULTIVITAMIN) tablet Take 1 tablet by  mouth daily.   pantoprazole (PROTONIX) 40 MG tablet Take 1 tablet (40 mg total) by mouth as needed. 30 minutes before breakfast   predniSONE (DELTASONE) 20 MG tablet 2 po at sametime daily for 5 days- start tomorrow   rosuvastatin (CRESTOR) 10 MG tablet Take 10 mg by mouth daily.   rosuvastatin (CRESTOR) 20 MG tablet Take 1 tablet (20 mg total) by mouth daily.   triamcinolone (KENALOG) 0.025 % ointment Apply 1 application topically 2 (two) times daily.   No facility-administered encounter medications on file as of 10/08/2021.    Allergies  Allergen Reactions   Nsaids Other (See Comments)    AVOID all NSAID due to history of GI bleeding   Atorvastatin Other (See Comments)    Joint & muscle pain    Review of Systems  Constitutional:  Negative for activity change, appetite change, chills, diaphoresis, fatigue, fever and unexpected weight change.  HENT: Negative.  Negative for congestion, rhinorrhea, sore throat, trouble swallowing and voice change.   Eyes: Negative.  Negative for pain.  Respiratory:  Negative for apnea, cough, choking, chest tightness,  shortness of breath, wheezing and stridor.   Cardiovascular:  Negative for chest pain, palpitations and leg swelling.  Gastrointestinal:  Negative for abdominal pain, anorexia, blood in stool, constipation, diarrhea, nausea and vomiting.  Endocrine: Negative.   Genitourinary:  Negative for decreased urine volume, difficulty urinating, dysuria, frequency and urgency.  Musculoskeletal:  Negative for arthralgias, joint pain, joint swelling and myalgias.  Skin:  Positive for rash. Negative for color change, nail changes, pallor and wound.  Allergic/Immunologic: Negative.   Neurological:  Negative for dizziness, weakness and headaches.  Hematological: Negative.   Psychiatric/Behavioral:  Negative for confusion, hallucinations, sleep disturbance and suicidal ideas.   All other systems reviewed and are negative.      Objective:  BP 130/73   Pulse 68   Temp 98.5 F (36.9 C)   Ht $R'5\' 4"'et$  (1.626 m)   Wt 235 lb (106.6 kg)   SpO2 97%   BMI 40.34 kg/m    Wt Readings from Last 3 Encounters:  10/08/21 235 lb (106.6 kg)  08/08/21 236 lb (107 kg)  08/08/21 238 lb (108 kg)    Physical Exam Vitals and nursing note reviewed.  Constitutional:      General: She is not in acute distress.    Appearance: Normal appearance. She is obese. She is not ill-appearing, toxic-appearing or diaphoretic.  HENT:     Head: Normocephalic and atraumatic.     Nose: Nose normal. No congestion or rhinorrhea.     Mouth/Throat:     Mouth: Mucous membranes are moist.     Pharynx: Oropharynx is clear.  Eyes:     Pupils: Pupils are equal, round, and reactive to light.  Cardiovascular:     Rate and Rhythm: Normal rate and regular rhythm.     Heart sounds: Normal heart sounds.  Pulmonary:     Effort: Pulmonary effort is normal.     Breath sounds: Normal breath sounds.  Musculoskeletal:        General: No swelling or tenderness.     Right lower leg: 1+ Edema present.     Left lower leg: 1+ Edema present.  Skin:     General: Skin is warm and dry.     Capillary Refill: Capillary refill takes less than 2 seconds.     Findings: Rash present. Rash is papular.     Comments: Dry skin to forehead, cheeks, and neck. Scaling  skin to ears and scalp. No erythema, swelling, drainage or tenderness.   Neurological:     General: No focal deficit present.     Mental Status: She is alert and oriented to person, place, and time.  Psychiatric:        Mood and Affect: Mood normal.        Behavior: Behavior normal.        Thought Content: Thought content normal.        Judgment: Judgment normal.    Results for orders placed or performed in visit on 06/27/21  CBC with Differential/Platelet  Result Value Ref Range   WBC 6.9 3.4 - 10.8 x10E3/uL   RBC 3.59 (L) 3.77 - 5.28 x10E6/uL   Hemoglobin 10.7 (L) 11.1 - 15.9 g/dL   Hematocrit 34.2 34.0 - 46.6 %   MCV 95 79 - 97 fL   MCH 29.8 26.6 - 33.0 pg   MCHC 31.3 (L) 31.5 - 35.7 g/dL   RDW 13.4 11.7 - 15.4 %   Platelets 308 150 - 450 x10E3/uL   Neutrophils 52 Not Estab. %   Lymphs 38 Not Estab. %   Monocytes 9 Not Estab. %   Eos 1 Not Estab. %   Basos 0 Not Estab. %   Neutrophils Absolute 3.5 1.4 - 7.0 x10E3/uL   Lymphocytes Absolute 2.6 0.7 - 3.1 x10E3/uL   Monocytes Absolute 0.6 0.1 - 0.9 x10E3/uL   EOS (ABSOLUTE) 0.1 0.0 - 0.4 x10E3/uL   Basophils Absolute 0.0 0.0 - 0.2 x10E3/uL   Immature Granulocytes 0 Not Estab. %   Immature Grans (Abs) 0.0 0.0 - 0.1 x10E3/uL  CMP14+EGFR  Result Value Ref Range   Glucose 99 70 - 99 mg/dL   BUN 22 8 - 27 mg/dL   Creatinine, Ser 1.77 (H) 0.57 - 1.00 mg/dL   eGFR 29 (L) >59 mL/min/1.73   BUN/Creatinine Ratio 12 12 - 28   Sodium 140 134 - 144 mmol/L   Potassium 4.5 3.5 - 5.2 mmol/L   Chloride 106 96 - 106 mmol/L   CO2 19 (L) 20 - 29 mmol/L   Calcium 9.7 8.7 - 10.3 mg/dL   Total Protein 7.3 6.0 - 8.5 g/dL   Albumin 3.9 3.7 - 4.7 g/dL   Globulin, Total 3.4 1.5 - 4.5 g/dL   Albumin/Globulin Ratio 1.1 (L) 1.2 - 2.2    Bilirubin Total 0.3 0.0 - 1.2 mg/dL   Alkaline Phosphatase 95 44 - 121 IU/L   AST 15 0 - 40 IU/L   ALT 8 0 - 32 IU/L  Lipid panel  Result Value Ref Range   Cholesterol, Total 167 100 - 199 mg/dL   Triglycerides 119 0 - 149 mg/dL   HDL 43 >39 mg/dL   VLDL Cholesterol Cal 21 5 - 40 mg/dL   LDL Chol Calc (NIH) 103 (H) 0 - 99 mg/dL   Chol/HDL Ratio 3.9 0.0 - 4.4 ratio  TSH  Result Value Ref Range   TSH 0.277 (L) 0.450 - 4.500 uIU/mL       Pertinent labs & imaging results that were available during my care of the patient were reviewed by me and considered in my medical decision making.  Assessment & Plan:  Shelly [name] was seen today for rash.  Diagnoses and all orders for this visit:  Pruritic rash Dry skin dermatitis Dry skin to face, neck, scalp, and upper arms. Will burst with steroids as pt complains of pruritis. Symptomatic care discussed in detail. Aware to use jar  emollients for skin and tea tree oil for scalp. Declines alpha gal testing today. If symptoms persist or worsen, will refer to dermatology. No signs of anaphylaxis.  -     predniSONE (DELTASONE) 20 MG tablet; 2 po at sametime daily for 5 days- start tomorrow     Continue all other maintenance medications.  Follow up plan: Return if symptoms worsen or fail to improve.   Continue healthy lifestyle choices, including diet (rich in fruits, vegetables, and lean proteins, and low in salt and simple carbohydrates) and exercise (at least 30 minutes of moderate physical activity daily).   The above assessment and management plan was discussed with the patient. The patient verbalized understanding of and has agreed to the management plan. Patient is aware to call the clinic if they develop any new symptoms or if symptoms persist or worsen. Patient is aware when to return to the clinic for a follow-up visit. Patient educated on when it is appropriate to go to the emergency department.   Monia Pouch, FNP-C [place]  [facility] [phone]

## 2021-10-16 ENCOUNTER — Ambulatory Visit: Payer: Medicare Other | Admitting: Gastroenterology

## 2021-10-16 DIAGNOSIS — D508 Other iron deficiency anemias: Secondary | ICD-10-CM

## 2021-10-16 DIAGNOSIS — I10 Essential (primary) hypertension: Secondary | ICD-10-CM

## 2021-10-16 DIAGNOSIS — I2510 Atherosclerotic heart disease of native coronary artery without angina pectoris: Secondary | ICD-10-CM

## 2021-10-16 DIAGNOSIS — M1712 Unilateral primary osteoarthritis, left knee: Secondary | ICD-10-CM

## 2021-10-16 DIAGNOSIS — I252 Old myocardial infarction: Secondary | ICD-10-CM

## 2021-10-16 DIAGNOSIS — F32A Depression, unspecified: Secondary | ICD-10-CM

## 2021-10-17 DIAGNOSIS — R809 Proteinuria, unspecified: Secondary | ICD-10-CM | POA: Diagnosis not present

## 2021-10-17 DIAGNOSIS — N184 Chronic kidney disease, stage 4 (severe): Secondary | ICD-10-CM | POA: Diagnosis not present

## 2021-10-17 DIAGNOSIS — D638 Anemia in other chronic diseases classified elsewhere: Secondary | ICD-10-CM | POA: Diagnosis not present

## 2021-10-17 DIAGNOSIS — I5032 Chronic diastolic (congestive) heart failure: Secondary | ICD-10-CM | POA: Diagnosis not present

## 2021-10-17 DIAGNOSIS — E0590 Thyrotoxicosis, unspecified without thyrotoxic crisis or storm: Secondary | ICD-10-CM | POA: Diagnosis not present

## 2021-10-17 DIAGNOSIS — I129 Hypertensive chronic kidney disease with stage 1 through stage 4 chronic kidney disease, or unspecified chronic kidney disease: Secondary | ICD-10-CM | POA: Diagnosis not present

## 2021-10-18 LAB — T4, FREE: Free T4: 1.11 ng/dL (ref 0.82–1.77)

## 2021-10-18 LAB — TSH: TSH: 0.062 u[IU]/mL — ABNORMAL LOW (ref 0.450–4.500)

## 2021-10-18 LAB — T3, FREE: T3, Free: 2.6 pg/mL (ref 2.0–4.4)
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
## 2021-10-21 ENCOUNTER — Encounter: Payer: Self-pay | Admitting: Gastroenterology

## 2021-10-21 ENCOUNTER — Ambulatory Visit: Payer: Medicare Other | Admitting: Gastroenterology

## 2021-10-21 VITALS — BP 160/62 | HR 62 | Temp 97.8°F | Ht 66.0 in | Wt 237.4 lb

## 2021-10-21 DIAGNOSIS — K219 Gastro-esophageal reflux disease without esophagitis: Secondary | ICD-10-CM

## 2021-10-21 NOTE — Patient Instructions (Signed)
It was a pleasure to see you today!  Continue your Protonix as needed.  We will see you again in 3 months, do not hesitate to call if you have any further questions or concerns.   I want to create trusting relationships with patients. If you receive a survey regarding your visit,  I greatly appreciate you taking time to fill this out on paper or through your MyChart. I value your feedback.  Venetia Night, MSN, FNP-BC, AGACNP-BC [facility]

## 2021-10-29 ENCOUNTER — Encounter: Payer: Self-pay | Admitting: Nurse Practitioner

## 2021-10-29 ENCOUNTER — Ambulatory Visit (INDEPENDENT_AMBULATORY_CARE_PROVIDER_SITE_OTHER): Payer: Medicare Other | Admitting: Nurse Practitioner

## 2021-10-29 VITALS — BP 143/73 | HR 80 | Ht 66.0 in | Wt 236.0 lb

## 2021-10-29 DIAGNOSIS — E0590 Thyrotoxicosis, unspecified without thyrotoxic crisis or storm: Secondary | ICD-10-CM | POA: Diagnosis not present

## 2021-10-29 NOTE — Progress Notes (Signed)
10/29/2021, 2:33 PM                                     Endocrinology follow-up note    Subjective:    Patient ID: Shelly [name], female    DOB: 1940-12-12, PCP [name], [name], MD   Past Medical History:  Diagnosis Date   Allergy    Anemia    GI bleed   Anginal pain ([place])    Anxiety    Arthritis    Blood transfusion without reported diagnosis    CAD (coronary artery disease)    a. s/p NSTEMI in 2015 with DES x2 to RCA   Cataract    Chronic kidney disease    Coronary artery disease    SEVERE   GERD (gastroesophageal reflux disease)    GI bleed 12/2013   Goiter    Hyperlipidemia    Hypertension    Myocardial infarct [facility])    Myocardial infarction ([place]) 08/2013   Neuralgia of right lower extremity 07/13/2017   Pancolonic diverticulosis 12/2013   Post herpetic neuralgia    Past Surgical History:  Procedure Laterality Date   CARPAL TUNNEL RELEASE Bilateral    COLONOSCOPY N/A 01/13/2014   Dr. Hung:pandiverticulosis    COLONOSCOPY N/A 12/14/2015   Dr. Michail Sermon: pancolonic diverticulosis, internal hemorrhoids    CORNEAL TRANSPLANT     CORONARY ANGIOPLASTY  08/2013   ESOPHAGOGASTRODUODENOSCOPY N/A 12/14/2015   Dr. Michail Sermon: normal    GIVENS CAPSULE STUDY N/A 08/13/2016   Dr. Oneida Alar: enteritis due to aspirin.    HEEL SPUR EXCISION     LEFT HEART CATHETERIZATION WITH CORONARY ANGIOGRAM N/A 08/23/2013   Procedure: LEFT HEART CATHETERIZATION WITH CORONARY ANGIOGRAM;  Surgeon: Peter M Martinique, MD;  Location: [facility] CATH LAB;  Service: Cardiovascular;  Laterality: N/A;   PERCUTANEOUS CORONARY STENT INTERVENTION (PCI-S) N/A 08/24/2013   Procedure: PERCUTANEOUS CORONARY STENT INTERVENTION (PCI-S);  Surgeon: Peter M Martinique, MD;  Location: [facility] CATH LAB;  Service: Cardiovascular;  Laterality: N/A;   TONSILLECTOMY     TUBAL LIGATION     Social History   Socioeconomic History   Marital status: Divorced    Spouse name: Not on file    Number of children: 8   Years of education: 11   Highest education level: 11th grade  Occupational History   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   No daily caffeine use.   2 story home but she only has to use one level   Social Determinants of Radio broadcast assistant Strain: Low Risk  (  08/08/2021)   Overall Financial Resource Strain (CARDIA)    Difficulty of Paying Living Expenses: Not hard at all  Food Insecurity: No Food Insecurity (08/08/2021)   Hunger Vital Sign    Worried About Running Out of Food in the Last Year: Never true    Ran Out of Food in the Last Year: Never true  Transportation Needs: No Transportation Needs (08/08/2021)   PRAPARE - Hydrologist (Medical): No    Lack of Transportation (Non-Medical): No  Physical Activity: Inactive (10/01/2021)   Exercise Vital Sign    Days of Exercise per Week: 0 days    Minutes of Exercise per Session: 0 min  Stress: Stress Concern Present (10/01/2021)   [place]    Feeling of Stress : Rather much  Social Connections: Socially Isolated (08/08/2021)   Social Connection and Isolation Panel [NHANES]    Frequency of Communication with Friends and Family: More than three times a week    Frequency of Social Gatherings with Friends and Family: More than three times a week    Attends Religious Services: Never    Building surveyor or Organizations: No    Attends Archivist Meetings: Never    Marital Status: Widowed   Family History  Problem Relation Age of Onset   Hypertension Brother    Transient ischemic attack Brother    Cancer Mother        unsure of type    Other Father        grangrene    Asthma Sister    Stroke Sister    COPD Sister    Stroke Sister    Early death Brother    Early death Brother    Liver cancer Daughter    Cancer Daughter    Hernia Son        Umbilical   GI problems Son    Pancreatic disease Daughter        pancreatectomy   Cancer Daughter    Diverticulitis Daughter    Arthritis Daughter        knee    Arthritis Daughter        shoulder    Anemia Daughter    GER disease Son    Colon cancer Neg Hx    Colon polyps Neg Hx    Outpatient Encounter Medications as of 10/29/2021  Medication Sig   amLODipine (NORVASC) 5 MG tablet TAKE ONE TABLET BY MOUTH ONCE DAILY   betamethasone valerate (VALISONE) 0.1 % cream Apply 1 application. topically 2 (two) times daily. prn   cetirizine (ZYRTEC) 10 MG tablet Take 10 mg by mouth daily.   chlorthalidone (HYGROTON) 25 MG tablet TAKE ONE TABLET BY MOUTH DAILY   clopidogrel (PLAVIX) 75 MG tablet TAKE ONE TABLET BY MOUTH DAILY   diclofenac Sodium (VOLTAREN) 1 % GEL Apply 2 g topically 4 (four) times daily.   furosemide (LASIX) 40 MG tablet Take 40 mg by mouth daily as needed (weight gain).   gabapentin (NEURONTIN) 100 MG capsule TAKE ONE CAPSULE BY MOUTH THREE TIMES DAILY   hydrocortisone (ANUSOL-HC) 2.5 % rectal cream Place 1 application rectally 2 (two) times daily. As needed for rectal discomfort.   lisinopril (ZESTRIL) 40 MG tablet TAKE ONE TABLET BY MOUTH DAILY   metoprolol succinate (TOPROL-XL) 50 MG 24 hr tablet Take 1 tablet (50 mg total) by mouth daily. Take with or immediately following a meal.  Multiple Vitamin (MULTIVITAMIN) tablet Take 1 tablet by mouth daily.   pantoprazole (PROTONIX) 40 MG tablet Take  1 tablet (40 mg total) by mouth as needed. 30 minutes before breakfast   predniSONE (DELTASONE) 20 MG tablet 2 po at sametime daily for 5 days- start tomorrow   rosuvastatin (CRESTOR) 20 MG tablet Take 1 tablet (20 mg total) by mouth daily.   triamcinolone (KENALOG) 0.025 % ointment Apply 1 application topically 2 (two) times daily.   No facility-administered encounter medications on file as of 10/29/2021.   ALLERGIES: Allergies  Allergen Reactions   Nsaids Other (See Comments)    AVOID all NSAID due to history of GI bleeding   Atorvastatin Other (See Comments)    Joint & muscle pain    VACCINATION STATUS: Immunization History  Administered Date(s) Administered   Pneumococcal Conjugate-13 10/02/2016   Pneumococcal Polysaccharide-23 08/25/2013   Zoster Recombinat (Shingrix) 03/17/2018    Thyroid Problem Presents for follow-up visit. Patient reports no anxiety, cold intolerance, constipation, depressed mood, fatigue, heat intolerance, leg swelling, palpitations, tremors, weight gain or weight loss. The symptoms have been stable.    Shelly [name] is 81 y.o. female who is seen in follow-up after she was seen in consultation for subclinical hyperthyroidism.    PMD: [name], [name], MD.  Due to septic symptoms of thyrotoxicosis, she was started on low-dose methimazole, which was since tapered down.  She has been off the Methimazole for some time now with stable thyroid response.  She denies any history of goiter, no family history of thyroid dysfunction or thyroid malignancy.  Her thyroid uptake and scan on June 14, 2019 showed 24-hour uptake of 10.3% which is normal.  There was asymmetric thyroid activity with generally increased uptake in the right lobe.  No focal hot or cold nodules identified.  Her medical history includes hypertension and hyperlipidemia on treatment.  Review of systems  Constitutional: + Minimally fluctuating body weight,  current Body mass index is  38.09 kg/m. , no fatigue, no subjective hyperthermia, no subjective hypothermia Eyes: no blurry vision, no xerophthalmia ENT: no sore throat, no nodules palpated in throat, no dysphagia/odynophagia, no hoarseness Cardiovascular: no chest pain, no shortness of breath, no palpitations, no leg swelling Respiratory: no cough, no shortness of breath Gastrointestinal: no nausea/vomiting/diarrhea Musculoskeletal: no muscle/joint aches Skin: no rashes, no hyperemia Neurological: no tremors, no numbness, no tingling, no dizziness Psychiatric: no depression, no anxiety  Objective:    BP (!) 143/73   Pulse 80   Ht 5\' 6"  (1.676 m)   Wt 236 lb (107 kg)   BMI 38.09 kg/m   Wt Readings from Last 3 Encounters:  10/29/21 236 lb (107 kg)  10/21/21 237 lb 6.4 oz (107.7 kg)  10/08/21 235 lb (106.6 kg)    BP Readings from Last 3 Encounters:  10/29/21 (!) 143/73  10/21/21 (!) 160/62  10/08/21 130/73     Physical Exam- Limited  Constitutional:  Body mass index is 38.09 kg/m. , not in acute distress, normal state of mind Eyes:  EOMI, no exophthalmos Neck: Supple Cardiovascular: RRR, no murmurs, rubs, or gallops, no edema Respiratory: Adequate breathing efforts, no crackles, rales, rhonchi, or wheezing Musculoskeletal: no gross deformities, strength intact in all four extremities, no gross restriction of joint movements Skin:  no rashes, no hyperemia Neurological: no tremor with outstretched hands    CMP     Component Value Date/Time   NA 140 06/27/2021 1214   K 4.5 06/27/2021 1214   CL  106 06/27/2021 1214   CO2 19 (L) 06/27/2021 1214   GLUCOSE 99 06/27/2021 1214   GLUCOSE 114 (H) 04/05/2021 1319   BUN 22 06/27/2021 1214   CREATININE 1.77 (H) 06/27/2021 1214   CREATININE 1.31 (H) 12/21/2018 1547   CALCIUM 9.7 06/27/2021 1214   PROT 7.3 06/27/2021 1214   ALBUMIN 3.9 06/27/2021 1214   AST 15 06/27/2021 1214   ALT 8 06/27/2021 1214   ALKPHOS 95 06/27/2021 1214   BILITOT 0.3  06/27/2021 1214   GFRNONAA 32 (L) 04/05/2021 1319   GFRAA 34 (L) 04/30/2020 1510     Diabetic Labs (most recent): Lab Results  Component Value Date   HGBA1C 6.0 (H) 12/09/2015   HGBA1C 6.0 (H) 08/23/2013   MICROALBUR 18.6 (H) 04/05/2021     Lipid Panel ( most recent) Lipid Panel     Component Value Date/Time   CHOL 167 06/27/2021 1214   TRIG 119 06/27/2021 1214   HDL 43 06/27/2021 1214   CHOLHDL 3.9 06/27/2021 1214   CHOLHDL 2.6 11/20/2016 0854   VLDL 11 11/20/2016 0854   LDLCALC 103 (H) 06/27/2021 1214   LABVLDL 21 06/27/2021 1214      Lab Results  Component Value Date   TSH 0.062 (L) 10/17/2021   TSH 0.195 (L) 07/24/2021   TSH 0.277 (L) 06/27/2021   TSH 0.224 (L) 04/02/2021   TSH 0.120 (L) 02/08/2021   TSH 0.161 (L) 10/16/2020   TSH 0.227 (L) 07/04/2020   TSH 0.227 (L) 05/03/2020   TSH 0.218 (L) 04/30/2020   TSH 0.384 (L) 01/31/2020   FREET4 1.11 10/17/2021   FREET4 0.96 07/24/2021   FREET4 0.88 04/02/2021   FREET4 1.02 10/16/2020   FREET4 1.00 07/04/2020   FREET4 0.97 05/03/2020   FREET4 1.0 01/02/2020   FREET4 0.9 09/23/2019   FREET4 0.9 05/30/2019   FREET4 1.04 12/11/2015     Latest Reference Range & Units 02/08/21 13:52 04/02/21 13:48 06/27/21 12:14 07/24/21 15:43 10/17/21 13:49  TSH 0.450 - 4.500 uIU/mL 0.120 (L) 0.224 (L) 0.277 (L) 0.195 (L) 0.062 (L)  Triiodothyronine,Free,Serum 2.0 - 4.4 pg/mL  2.4  2.7 2.6  T4,Free(Direct) 0.82 - 1.77 ng/dL  0.88  0.96 1.11  (L): Data is abnormally low  Assessment & Plan:   1. Subclinical hyperthyroidism  -Her most recent thyroid function tests are stable, TSH still minimally suppressed but FT3 and FT4 are normal.  There is no need for her to restart Methimazole at this time.  Will monitor TFTs again in 3 months (patient prefers to have more frequent checkups than the 6 month I offered her).    - she is advised to maintain close follow up with [name], [name], MD for primary care needs.    I spent  20 minutes in the care of the patient today including review of labs from Thyroid Function, CMP, and other relevant labs ; imaging/biopsy records (current and previous including abstractions from other facilities); face-to-face time discussing  her lab results and symptoms, medications doses, her options of short and long term treatment based on the latest standards of care / guidelines;   and documenting the encounter.  Shelly [name]  participated in the discussions, expressed understanding, and voiced agreement with the above plans.  All questions were answered to her satisfaction. she is encouraged to contact clinic should she have any questions or concerns prior to her return visit.   Follow up plan: Return in about 3 months (around 01/29/2022) for Thyroid follow up, Previsit  labs.   Rayetta Pigg, [facility] [address] [place], [place] [address] Phone: [phone] Fax: [phone]   10/29/2021, 2:33 PM

## 2021-10-30 ENCOUNTER — Ambulatory Visit (INDEPENDENT_AMBULATORY_CARE_PROVIDER_SITE_OTHER): Payer: Medicare Other | Admitting: Family Medicine

## 2021-10-30 ENCOUNTER — Encounter: Payer: Self-pay | Admitting: Family Medicine

## 2021-10-30 VITALS — BP 116/57 | HR 74 | Temp 97.0°F | Ht 66.0 in | Wt 235.0 lb

## 2021-10-30 DIAGNOSIS — M1712 Unilateral primary osteoarthritis, left knee: Secondary | ICD-10-CM | POA: Diagnosis not present

## 2021-10-30 DIAGNOSIS — F419 Anxiety disorder, unspecified: Secondary | ICD-10-CM | POA: Diagnosis not present

## 2021-10-30 DIAGNOSIS — I10 Essential (primary) hypertension: Secondary | ICD-10-CM

## 2021-10-30 DIAGNOSIS — E0590 Thyrotoxicosis, unspecified without thyrotoxic crisis or storm: Secondary | ICD-10-CM | POA: Diagnosis not present

## 2021-10-30 DIAGNOSIS — B0229 Other postherpetic nervous system involvement: Secondary | ICD-10-CM

## 2021-10-30 DIAGNOSIS — N1832 Chronic kidney disease, stage 3b: Secondary | ICD-10-CM | POA: Diagnosis not present

## 2021-10-30 MED ORDER — CHLORTHALIDONE 25 MG PO TABS: 25.0000 mg | ORAL_TABLET | Freq: Every day | ORAL | 3 refills | Status: DC

## 2021-10-30 MED ORDER — LISINOPRIL 40 MG PO TABS: 40.0000 mg | ORAL_TABLET | Freq: Every day | ORAL | 3 refills | Status: DC

## 2021-10-30 MED ORDER — TIZANIDINE HCL 2 MG PO CAPS: 2.0000 mg | ORAL_CAPSULE | Freq: Every evening | ORAL | 1 refills | Status: DC | PRN

## 2021-10-30 MED ORDER — PANTOPRAZOLE SODIUM 40 MG PO TBEC: 40.0000 mg | DELAYED_RELEASE_TABLET | ORAL | 3 refills | Status: DC | PRN

## 2021-10-30 MED ORDER — GABAPENTIN 100 MG PO CAPS: 100.0000 mg | ORAL_CAPSULE | Freq: Three times a day (TID) | ORAL | 3 refills | Status: DC

## 2021-10-30 NOTE — Progress Notes (Signed)
BP (!) 116/57   Pulse 74   Temp (!) 97 F (36.1 C)   Ht _0  (1.676 m)   Wt 235 lb (106.6 kg)   SpO2 99%   BMI 37.93 kg/m    Subjective:   Patient ID: Shelly [name], female    DOB: 1940-09-02, 81 y.o.   MRN: [id number]  HPI: Shelly [name] is a 81 y.o. female presenting on 10/30/2021 for Medical Management of Chronic Issues, Hypertension, and Hypothyroidism   HPI Hypertension Patient is currently on amlodipine and furosemide and metoprolol has also been chlorthalidone and lisinopril, and their blood pressure today is 116/57. Patient denies any lightheadedness or dizziness. Patient denies headaches, blurred vision, chest pains, shortness of breath, or weakness. Denies any side effects from medication and is content with current medication.   GERD Patient is currently on taking pantoprazole.  She denies any major symptoms or abdominal pain or belching or burping. She denies any blood in her stool or lightheadedness or dizziness.   Hyperthyroidism recheck Patient is coming in for thyroid recheck today as well. They deny any issues with hair changes or heat or cold problems or diarrhea or constipation. They deny any chest pain or palpitations. They are currently on no medication  Anxiety recheck Patient is coming in for anxiety recheck.  She currently takes gabapentin help with neuropathy and anxiety and feels like it does well for her.  Relevant past medical, surgical, family and social history reviewed and updated as indicated. Interim medical history since our last visit reviewed. Allergies and medications reviewed and updated.  Review of Systems  Constitutional:  Negative for chills and fever.  Eyes:  Negative for visual disturbance.  Respiratory:  Negative for chest tightness and shortness of breath.   Cardiovascular:  Negative for chest pain and leg swelling.  Genitourinary:  Negative for difficulty urinating and dysuria.  Musculoskeletal:  Negative for back pain  and gait problem.  Skin:  Negative for rash.  Neurological:  Negative for light-headedness and headaches.  Psychiatric/Behavioral:  Negative for agitation and behavioral problems.   All other systems reviewed and are negative.   Per HPI unless specifically indicated above   Allergies as of 10/30/2021       Reactions   Nsaids Other (See Comments)   AVOID all NSAID due to history of GI bleeding   Atorvastatin Other (See Comments)   Joint & muscle pain        Medication List        Accurate as of October 30, 2021  3:35 PM. If you have any questions, ask your nurse or doctor.          STOP taking these medications    predniSONE 20 MG tablet Commonly known as: DELTASONE Stopped by: [name] Naquan Garman, MD       TAKE these medications    amLODipine 5 MG tablet Commonly known as: NORVASC TAKE ONE TABLET BY MOUTH ONCE DAILY   betamethasone valerate 0.1 % cream Commonly known as: VALISONE Apply 1 application. topically 2 (two) times daily. prn   cetirizine 10 MG tablet Commonly known as: ZYRTEC Take 10 mg by mouth daily.   chlorthalidone 25 MG tablet Commonly known as: HYGROTON Take 1 tablet (25 mg total) by mouth daily.   clopidogrel 75 MG tablet Commonly known as: PLAVIX TAKE ONE TABLET BY MOUTH DAILY   diclofenac Sodium 1 % Gel Commonly known as: Voltaren Apply 2 g topically 4 (four) times daily.   furosemide  40 MG tablet Commonly known as: LASIX Take 40 mg by mouth daily as needed (weight gain).   gabapentin 100 MG capsule Commonly known as: NEURONTIN Take 1 capsule (100 mg total) by mouth 3 (three) times daily.   hydrocortisone 2.5 % rectal cream Commonly known as: ANUSOL-HC Place 1 application rectally 2 (two) times daily. As needed for rectal discomfort.   lisinopril 40 MG tablet Commonly known as: ZESTRIL Take 1 tablet (40 mg total) by mouth daily.   metoprolol succinate 50 MG 24 hr tablet Commonly known as: TOPROL-XL Take 1 tablet (50 mg  total) by mouth daily. Take with or immediately following a meal.   multivitamin tablet Take 1 tablet by mouth daily.   pantoprazole 40 MG tablet Commonly known as: PROTONIX Take 1 tablet (40 mg total) by mouth as needed. 30 minutes before breakfast   rosuvastatin 20 MG tablet Commonly known as: Crestor Take 1 tablet (20 mg total) by mouth daily.   tizanidine 2 MG capsule Commonly known as: ZANAFLEX Take 1 capsule (2 mg total) by mouth at bedtime as needed for muscle spasms. Started by: [name] Lavert Matousek, MD   triamcinolone 0.025 % ointment Commonly known as: KENALOG Apply 1 application topically 2 (two) times daily.         Objective:   BP (!) 116/57   Pulse 74   Temp (!) 97 F (36.1 C)   Ht _0  (1.676 m)   Wt 235 lb (106.6 kg)   SpO2 99%   BMI 37.93 kg/m   Wt Readings from Last 3 Encounters:  10/30/21 235 lb (106.6 kg)  10/29/21 236 lb (107 kg)  10/21/21 237 lb 6.4 oz (107.7 kg)    Physical Exam Vitals and nursing note reviewed.  Constitutional:      General: She is not in acute distress.    Appearance: She is well-developed. She is not diaphoretic.  Eyes:     Conjunctiva/sclera: Conjunctivae normal.  Cardiovascular:     Rate and Rhythm: Normal rate and regular rhythm.     Heart sounds: Normal heart sounds. No murmur heard. Pulmonary:     Effort: Pulmonary effort is normal. No respiratory distress.     Breath sounds: Normal breath sounds. No wheezing.  Musculoskeletal:        General: No swelling or tenderness. Normal range of motion.  Skin:    General: Skin is warm and dry.     Findings: No rash.  Neurological:     Mental Status: She is alert and oriented to person, place, and time.     Coordination: Coordination normal.  Psychiatric:        Behavior: Behavior normal.       Assessment & Plan:   Problem List Items Addressed This Visit       Cardiovascular and Mediastinum   Essential hypertension - Primary   Relevant Medications    chlorthalidone (HYGROTON) 25 MG tablet   lisinopril (ZESTRIL) 40 MG tablet   Other Relevant Orders   CBC with Differential/Platelet   CMP14+EGFR   Lipid panel     Endocrine   Subclinical hyperthyroidism   Relevant Orders   Thyroid Panel With TSH     Nervous and Auditory   Postherpetic neuralgia   Relevant Medications   gabapentin (NEURONTIN) 100 MG capsule     Musculoskeletal and Integument   Osteoarthritis left knee   Relevant Medications   tizanidine (ZANAFLEX) 2 MG capsule     Genitourinary   CKD (chronic kidney  disease), stage III ([place])   Relevant Medications   chlorthalidone (HYGROTON) 25 MG tablet   lisinopril (ZESTRIL) 40 MG tablet     Other   Anxiety    Continue current medicine except for gave tizanidine to see if it helps more with her muscles in her legs.  No other changes at this point.  We will check blood work Follow up plan: Return in about 3 months (around 01/30/2022), or if symptoms worsen or fail to improve, for Hypertension and thyroid.  Counseling provided for all of the vaccine components Orders Placed This Encounter  Procedures   CBC with Differential/Platelet   CMP14+EGFR   Lipid panel   Thyroid Panel With TSH    Caryl Pina, MD [facility] 10/30/2021, 3:35 PM

## 2021-10-31 LAB — THYROID PANEL WITH TSH
Free Thyroxine Index: 1.7 (ref 1.2–4.9)
T3 Uptake Ratio: 28 % (ref 24–39)
T4, Total: 6.1 ug/dL (ref 4.5–12.0)
TSH: 0.057 u[IU]/mL — ABNORMAL LOW (ref 0.450–4.500)

## 2021-10-31 LAB — CMP14+EGFR
ALT: 9 IU/L (ref 0–32)
AST: 13 IU/L (ref 0–40)
Albumin/Globulin Ratio: 1.3 (ref 1.2–2.2)
Albumin: 3.5 g/dL — ABNORMAL LOW (ref 3.7–4.7)
Alkaline Phosphatase: 85 IU/L (ref 44–121)
BUN/Creatinine Ratio: 15 (ref 12–28)
BUN: 27 mg/dL (ref 8–27)
Bilirubin Total: <0.2 mg/dL (ref 0.0–1.2)
CO2: 17 mmol/L — ABNORMAL LOW (ref 20–29)
Calcium: 9.7 mg/dL (ref 8.7–10.3)
Chloride: 111 mmol/L — ABNORMAL HIGH (ref 96–106)
Creatinine, Ser: 1.75 mg/dL — ABNORMAL HIGH (ref 0.57–1.00)
Globulin, Total: 2.7 g/dL (ref 1.5–4.5)
Glucose: 124 mg/dL — ABNORMAL HIGH (ref 70–99)
Potassium: 4.9 mmol/L (ref 3.5–5.2)
Sodium: 144 mmol/L (ref 134–144)
Total Protein: 6.2 g/dL (ref 6.0–8.5)
eGFR: 29 mL/min/{1.73_m2} — ABNORMAL LOW (ref >59)

## 2021-10-31 LAB — LIPID PANEL
Chol/HDL Ratio: 3.1 ratio (ref 0.0–4.4)
Cholesterol, Total: 156 mg/dL (ref 100–199)
HDL: 51 mg/dL (ref >39)
LDL Chol Calc (NIH): 89 mg/dL (ref 0–99)
Triglycerides: 87 mg/dL (ref 0–149)
VLDL Cholesterol Cal: 16 mg/dL (ref 5–40)

## 2021-10-31 LAB — CBC WITH DIFFERENTIAL/PLATELET
Basophils Absolute: 0 10*3/uL (ref 0.0–0.2)
Basos: 1 %
EOS (ABSOLUTE): 0.1 10*3/uL (ref 0.0–0.4)
Eos: 1 %
Hematocrit: 30.9 % — ABNORMAL LOW (ref 34.0–46.6)
Hemoglobin: 10.3 g/dL — ABNORMAL LOW (ref 11.1–15.9)
Immature Grans (Abs): 0 10*3/uL (ref 0.0–0.1)
Immature Granulocytes: 0 %
Lymphocytes Absolute: 1.9 10*3/uL (ref 0.7–3.1)
Lymphs: 32 %
MCH: 31 pg (ref 26.6–33.0)
MCHC: 33.3 g/dL (ref 31.5–35.7)
MCV: 93 fL (ref 79–97)
Monocytes Absolute: 0.6 10*3/uL (ref 0.1–0.9)
Monocytes: 9 %
Neutrophils Absolute: 3.5 10*3/uL (ref 1.4–7.0)
Neutrophils: 57 %
Platelets: 246 10*3/uL (ref 150–450)
RBC: 3.32 x10E6/uL — ABNORMAL LOW (ref 3.77–5.28)
RDW: 13.2 % (ref 11.7–15.4)
WBC: 6.1 10*3/uL (ref 3.4–10.8)

## 2021-11-06 DIAGNOSIS — N1832 Chronic kidney disease, stage 3b: Secondary | ICD-10-CM | POA: Diagnosis not present

## 2021-11-06 DIAGNOSIS — I129 Hypertensive chronic kidney disease with stage 1 through stage 4 chronic kidney disease, or unspecified chronic kidney disease: Secondary | ICD-10-CM | POA: Diagnosis not present

## 2021-11-06 DIAGNOSIS — I5032 Chronic diastolic (congestive) heart failure: Secondary | ICD-10-CM | POA: Diagnosis not present

## 2021-11-06 DIAGNOSIS — D638 Anemia in other chronic diseases classified elsewhere: Secondary | ICD-10-CM | POA: Diagnosis not present

## 2021-11-06 DIAGNOSIS — R809 Proteinuria, unspecified: Secondary | ICD-10-CM | POA: Diagnosis not present

## 2021-11-11 ENCOUNTER — Ambulatory Visit (INDEPENDENT_AMBULATORY_CARE_PROVIDER_SITE_OTHER): Payer: Medicare Other | Admitting: Licensed Clinical Social Worker

## 2021-11-11 DIAGNOSIS — D508 Other iron deficiency anemias: Secondary | ICD-10-CM

## 2021-11-11 DIAGNOSIS — I2510 Atherosclerotic heart disease of native coronary artery without angina pectoris: Secondary | ICD-10-CM

## 2021-11-11 DIAGNOSIS — E782 Mixed hyperlipidemia: Secondary | ICD-10-CM

## 2021-11-11 DIAGNOSIS — K219 Gastro-esophageal reflux disease without esophagitis: Secondary | ICD-10-CM

## 2021-11-11 DIAGNOSIS — I252 Old myocardial infarction: Secondary | ICD-10-CM

## 2021-11-11 DIAGNOSIS — M1712 Unilateral primary osteoarthritis, left knee: Secondary | ICD-10-CM

## 2021-11-11 NOTE — Chronic Care Management (AMB) (Signed)
Chronic Care Management    Clinical Social Work Note  11/11/2021 Name: Shelly [name] MRN: [id number] DOB: [birth year]  Shelly [name] is a 81 y.o. year old female who is a primary care patient of [name], [name], MD. The CCM team was consulted to assist the patient with chronic disease management and/or care coordination needs related to: [employer] .   Engaged with patient by telephone for follow up visit in response to provider referral for social work chronic care management and care coordination services.   Consent to Services:  The patient was given information about Chronic Care Management services, agreed to services, and gave verbal consent prior to initiation of services.  Please see initial visit note for detailed documentation.   Patient agreed to services and consent obtained.   Assessment: Review of patient past medical history, allergies, medications, and health status, including review of relevant consultants reports was performed today as part of a comprehensive evaluation and provision of chronic care management and care coordination services.     SDOH (Social Determinants of Health) assessments and interventions performed:  SDOH Interventions    Flowsheet Row Most Recent Value  SDOH Interventions   Physical Activity Interventions Other (Comments)  [walking challenges. Client uses a walker to help her walk when she leaves her home]  Stress Interventions Provide Counseling  [client has stress related to insomnia. client has stress related to managing Shingles]  Depression Interventions/Treatment  Counseling, Currently on Treatment        Advanced Directives Status: See Vyn[employer] application for related entries.  CCM Care Plan  Allergies  Allergen Reactions   Nsaids Other (See Comments)    AVOID all NSAID due to history of GI bleeding   Atorvastatin Other (See Comments)    Joint & muscle pain    Outpatient Encounter Medications as of 11/11/2021   Medication Sig   amLODipine (NORVASC) 5 MG tablet TAKE ONE TABLET BY MOUTH ONCE DAILY   betamethasone valerate (VALISONE) 0.1 % cream Apply 1 application. topically 2 (two) times daily. prn   cetirizine (ZYRTEC) 10 MG tablet Take 10 mg by mouth daily.   chlorthalidone (HYGROTON) 25 MG tablet Take 1 tablet (25 mg total) by mouth daily.   clopidogrel (PLAVIX) 75 MG tablet TAKE ONE TABLET BY MOUTH DAILY   diclofenac Sodium (VOLTAREN) 1 % GEL Apply 2 g topically 4 (four) times daily.   furosemide (LASIX) 40 MG tablet Take 40 mg by mouth daily as needed (weight gain).   gabapentin (NEURONTIN) 100 MG capsule Take 1 capsule (100 mg total) by mouth 3 (three) times daily.   hydrocortisone (ANUSOL-HC) 2.5 % rectal cream Place 1 application rectally 2 (two) times daily. As needed for rectal discomfort.   lisinopril (ZESTRIL) 40 MG tablet Take 1 tablet (40 mg total) by mouth daily.   metoprolol succinate (TOPROL-XL) 50 MG 24 hr tablet Take 1 tablet (50 mg total) by mouth daily. Take with or immediately following a meal.   Multiple Vitamin (MULTIVITAMIN) tablet Take 1 tablet by mouth daily.   pantoprazole (PROTONIX) 40 MG tablet Take 1 tablet (40 mg total) by mouth as needed. 30 minutes before breakfast   rosuvastatin (CRESTOR) 20 MG tablet Take 1 tablet (20 mg total) by mouth daily.   tizanidine (ZANAFLEX) 2 MG capsule Take 1 capsule (2 mg total) by mouth at bedtime as needed for muscle spasms.   triamcinolone (KENALOG) 0.025 % ointment Apply 1 application topically 2 (two) times daily.   No facility-administered encounter  medications on file as of 11/11/2021.    Patient Active Problem List   Diagnosis Date Noted   Subclinical hyperthyroidism 06/06/2019   Normocytic anemia 06/02/2019   Postherpetic neuralgia 09/28/2018   Herpes zoster without complication 56/81/2751   Iron deficiency anemia 08/27/2016   Morbid obesity ([place]) 08/27/2016   CKD (chronic kidney disease), stage III ([place]) 04/04/2016    Osteoarthritis left knee 04/04/2016   Third degree heart block ([place]) 12/10/2015   Pancolonic diverticulosis 12/09/2015   CAD (coronary artery disease) 01/11/2014   History of non-ST elevation myocardial infarction (NSTEMI) 08/23/2013   IDA (iron deficiency anemia) 08/23/2013   Essential hypertension 08/22/2013   GERD (gastroesophageal reflux disease) 08/22/2013   Anxiety 08/22/2013    Conditions to be addressed/monitored: monitor client management of anxiety issues and of grief issues  Care Plan : LCSW Care Plan  Updates made by Katha Cabal, LCSW since 11/11/2021 12:00 AM     Problem: Depression Identification (Depression)      Goal: Depressive Symptoms Identified;Manage Depression symptoms and manage grief symptoms   Start Date: 08/07/2021  Expected End Date: 02/10/2022  This Visit's Progress: On track  Recent Progress: On track  Priority: Medium  Note:   Current Barriers:  Chronic Mental Health needs related to anxiety issues and grief issues Suicidal Ideation/Homicidal Ideation: No Depression issues Mobility issues Pain issues  Clinical Social Work Goal(s):  patient will work with SW monthly by telephone or in person to reduce or manage symptoms related to anxiety and grief issues of client patient will attend all scheduled medical appointments as evidenced by patient report and care team review of appointment completion in EMR:   Patient will talk with RNCM as needed in next 30 days for nursing support  Interventions:  1:1 collaboration with Dr. [name] [name] regarding development and update of comprehensive plan of care as evidenced by provider attestation and co-signature Discussed knee pain issues with client. She said she has knee pain occasionally in her left knee.  Discussed ambulation of client (client has a walker to use as needed; client also has a cane to use as needed to help her walk) Provided counseling support for client Discussed appetite of  client . She said she has a reduced appetite. Reviewed sleeping issues of client. She said she has difficulty sleeping due to issues with Shingles Reviewed mood of client. She said she thought her mood was stable. She did not mention any mood issues. She said she enjoys talking daily via phone with her sister. Reviewed medication procurement of client. Discussed falls history. She said she has not had any recent falls Encouraged client to call RNCM as needed for nursing support for client. Reviewed edema issues. She said she has occasional swelling in her ankles or feet Reviewed family support. She has support from her son and from her granddaughter.  Patient Self Care Activities:  Eats meals independently Takes medications as prescribed Attends scheduled medical appointments  Patient Coping Strengths:  Completes ADLs Completes IADLS Makes transport arrangements as needed Attends medical appointments Communicates with LCSW or RNCM as needed for CCM support  Patient Self Care Deficits:  Grief issues Depression issues  Patient Goals:  - spend time or talk with others every day - practice relaxation or meditation daily - keep a calendar with appointment dates Talk with RNCM or LCSW as needed for support  Follow Up Plan: LCSW to call client on  12/17/21 at 2:00 PM      [place].Kailly Richoux MSW,  LCSW Licensed Clinical Social Worker [facility] [phone]

## 2021-11-15 DIAGNOSIS — M1712 Unilateral primary osteoarthritis, left knee: Secondary | ICD-10-CM

## 2021-11-15 DIAGNOSIS — E782 Mixed hyperlipidemia: Secondary | ICD-10-CM

## 2021-11-15 DIAGNOSIS — I252 Old myocardial infarction: Secondary | ICD-10-CM

## 2021-11-15 DIAGNOSIS — I2510 Atherosclerotic heart disease of native coronary artery without angina pectoris: Secondary | ICD-10-CM

## 2021-11-15 DIAGNOSIS — D508 Other iron deficiency anemias: Secondary | ICD-10-CM

## 2021-11-26 ENCOUNTER — Ambulatory Visit: Payer: Self-pay | Admitting: *Deleted

## 2021-11-26 DIAGNOSIS — M1712 Unilateral primary osteoarthritis, left knee: Secondary | ICD-10-CM

## 2021-11-26 DIAGNOSIS — I2510 Atherosclerotic heart disease of native coronary artery without angina pectoris: Secondary | ICD-10-CM

## 2021-11-26 NOTE — Patient Instructions (Signed)
Shelly [name]  I have enjoyed working with you through the Chronic Care Management Program at [facility]. Due to program changes and no recent contact with the [name], I am removing myself from your care team. If you are currently active with another CCM Team Member, you will remain active with them unless they reach out to you with additional information. If you feel that you need RN Care Management services in the future, please talk with your primary care provider to discuss re-engagement with the RN Care Manager.   Thank you for allowing me to participate in your your healthcare journey.  [name], BSN, RN-BC Embedded Chronic Care Manager [facility] Direct Dial: [phone]

## 2021-11-26 NOTE — Chronic Care Management (AMB) (Signed)
  Chronic Care Management   Note  11/26/2021 Name: Shelly [name] MRN: [id number] DOB: Nov 07, 1940   Patient has not recently engaged with the Chronic Care Management RN Care Manager. Removing RN Care Manager from Care Team and closing [facility]. If patient is currently engaged with another CCM team member I will forward this encounter to inform them of my case closure. Patient may be eligible for re-engagement with RN Care Manager in the future if necessary and can discuss this with their PCP.  [name], BSN, RN-BC Embedded Chronic Care Manager [facility] Direct Dial: [phone]

## 2021-12-02 ENCOUNTER — Ambulatory Visit (INDEPENDENT_AMBULATORY_CARE_PROVIDER_SITE_OTHER): Payer: Medicare Other | Admitting: Family Medicine

## 2021-12-02 VITALS — BP 156/79 | HR 87 | Temp 97.6°F | Ht 66.0 in | Wt 234.0 lb

## 2021-12-02 DIAGNOSIS — K047 Periapical abscess without sinus: Secondary | ICD-10-CM

## 2021-12-02 DIAGNOSIS — L2089 Other atopic dermatitis: Secondary | ICD-10-CM | POA: Diagnosis not present

## 2021-12-02 MED ORDER — MONTELUKAST SODIUM 10 MG PO TABS: 10.0000 mg | ORAL_TABLET | Freq: Every day | ORAL | 1 refills | Status: DC

## 2021-12-02 MED ORDER — AMOXICILLIN 500 MG PO CAPS: 500.0000 mg | ORAL_CAPSULE | Freq: Two times a day (BID) | ORAL | 0 refills | Status: DC

## 2021-12-02 NOTE — Progress Notes (Signed)
BP (!) 156/79   Pulse 87   Temp 97.6 F (36.4 C)   Ht 5\' 6"  (1.676 m)   Wt 234 lb (106.1 kg)   SpO2 99%   BMI 37.77 kg/m    Subjective:   Patient ID: Shelly [name], female    DOB: Oct 15, 1940, 81 y.o.   MRN: [id number]  HPI: Shelly [name] is a 81 y.o. female presenting on 12/02/2021 for Rash (On face, neck and back. Present for " a while" )   HPI Rash Patient is coming in for a rash on face neck and back and hands and ears.  It has been there for slightly less than a year.  It is pruritic and rough.  She took Cetaphil and it helped some.  She has a lot of dry skin.  She denies any new cleansing products.  She has more of a dry skin issue.   Relevant past medical, surgical, family and social history reviewed and updated as indicated. Interim medical history since our last visit reviewed. Allergies and medications reviewed and updated.  Review of Systems  Constitutional:  Negative for chills and fever.  Eyes:  Negative for visual disturbance.  Respiratory:  Negative for chest tightness and shortness of breath.   Cardiovascular:  Negative for chest pain and leg swelling.  Musculoskeletal:  Negative for back pain and gait problem.  Skin:  Positive for rash. Negative for color change.  Neurological:  Negative for dizziness, light-headedness and headaches.  Psychiatric/Behavioral:  Negative for agitation, behavioral problems and dysphoric mood. The patient is not nervous/anxious.   All other systems reviewed and are negative.   Per HPI unless specifically indicated above   Allergies as of 12/02/2021       Reactions   Nsaids Other (See Comments)   AVOID all NSAID due to history of GI bleeding   Atorvastatin Other (See Comments)   Joint & muscle pain        Medication List        Accurate as of December 02, 2021 10:08 AM. If you have any questions, ask your nurse or doctor.          amLODipine 5 MG tablet Commonly known as: NORVASC TAKE ONE TABLET BY MOUTH  ONCE DAILY   amoxicillin 500 MG capsule Commonly known as: AMOXIL Take 1 capsule (500 mg total) by mouth 2 (two) times daily.   betamethasone valerate 0.1 % cream Commonly known as: VALISONE Apply 1 application. topically 2 (two) times daily. prn   cetirizine 10 MG tablet Commonly known as: ZYRTEC Take 10 mg by mouth daily.   chlorthalidone 25 MG tablet Commonly known as: HYGROTON Take 1 tablet (25 mg total) by mouth daily.   clopidogrel 75 MG tablet Commonly known as: PLAVIX TAKE ONE TABLET BY MOUTH DAILY   diclofenac Sodium 1 % Gel Commonly known as: Voltaren Apply 2 g topically 4 (four) times daily.   furosemide 40 MG tablet Commonly known as: LASIX Take 40 mg by mouth daily as needed (weight gain).   gabapentin 100 MG capsule Commonly known as: NEURONTIN Take 1 capsule (100 mg total) by mouth 3 (three) times daily.   hydrocortisone 2.5 % rectal cream Commonly known as: ANUSOL-HC Place 1 application rectally 2 (two) times daily. As needed for rectal discomfort.   lisinopril 40 MG tablet Commonly known as: ZESTRIL Take 1 tablet (40 mg total) by mouth daily.   metoprolol succinate 50 MG 24 hr tablet Commonly known as: TOPROL-XL Take  1 tablet (50 mg total) by mouth daily. Take with or immediately following a meal.   montelukast 10 MG tablet Commonly known as: SINGULAIR Take 1 tablet (10 mg total) by mouth at bedtime.   multivitamin tablet Take 1 tablet by mouth daily.   pantoprazole 40 MG tablet Commonly known as: PROTONIX Take 1 tablet (40 mg total) by mouth as needed. 30 minutes before breakfast   rosuvastatin 20 MG tablet Commonly known as: Crestor Take 1 tablet (20 mg total) by mouth daily.   tizanidine 2 MG capsule Commonly known as: ZANAFLEX Take 1 capsule (2 mg total) by mouth at bedtime as needed for muscle spasms.   triamcinolone 0.025 % ointment Commonly known as: KENALOG Apply 1 application topically 2 (two) times daily.          Objective:   BP (!) 156/79   Pulse 87   Temp 97.6 F (36.4 C)   Ht 5\' 6"  (1.676 m)   Wt 234 lb (106.1 kg)   SpO2 99%   BMI 37.77 kg/m   Wt Readings from Last 3 Encounters:  12/02/21 234 lb (106.1 kg)  10/30/21 235 lb (106.6 kg)  10/29/21 236 lb (107 kg)    Physical Exam Vitals and nursing note reviewed.  Constitutional:      General: She is not in acute distress.    Appearance: She is well-developed. She is not diaphoretic.  Eyes:     Conjunctiva/sclera: Conjunctivae normal.  Cardiovascular:     Rate and Rhythm: Normal rate and regular rhythm.     Heart sounds: Normal heart sounds. No murmur heard. Pulmonary:     Effort: Pulmonary effort is normal. No respiratory distress.     Breath sounds: Normal breath sounds. No wheezing.  Skin:    General: Skin is warm and dry.     Findings: No rash.  Neurological:     Mental Status: She is alert and oriented to person, place, and time.     Coordination: Coordination normal.  Psychiatric:        Behavior: Behavior normal.       Assessment & Plan:   Problem List Items Addressed This Visit   None Visit Diagnoses     Other atopic dermatitis    -  Primary   Relevant Medications   montelukast (SINGULAIR) 10 MG tablet   Dental infection       Relevant Medications   amoxicillin (AMOXIL) 500 MG capsule     Recommended continue moisturizer, give amoxicillin for her dental stuff until she gets the dentist but for her skin staff recommended adding Singulair and keeping the moisturizer going and keeping Zyrtec going.  Follow up plan: Return if symptoms worsen or fail to improve.  Counseling provided for all of the vaccine components No orders of the defined types were placed in this encounter.   Caryl Pina, MD [facility] 12/02/2021, 10:08 AM

## 2021-12-02 NOTE — Progress Notes (Signed)
BP (!) 156/79   Pulse 87   Temp 97.6 F (36.4 C)   Ht 5\' 6"  (1.676 m)   Wt 234 lb (106.1 kg)   SpO2 99%   BMI 37.77 kg/m    Subjective:   Patient ID: Shelly [name], female    DOB: Feb 27, 1941, 81 y.o.   MRN: [id number]  HPI: Shelly [name] is a 81 y.o. female presenting on 12/02/2021 for Rash (On face, neck and back. Present for " a while" )   HPI Shelly [name] is an 81 y.o. female presenting for a rash. She states the rash is present on her face, back, hands, and ears and has been present for "a while" but less than one year. She states the rash is pruritic and rough and bothers her throughout the day. She notices her skin and dry and flaky, especially when taking a bath. She states Cetaphil helps with the rash while triamcinolone cream does not help. She denies knowing of anything that makes the rash worse. She also denies using any new laundry detergents, lotions, and soaps.   Patient also reports tooth pain since one of her fillings recently fell out. She states her gums are also sore. She is planning to schedule a dentist appointment once her insurance approves it.  Relevant past medical, surgical, family and social history reviewed and updated as indicated. Interim medical history since our last visit reviewed. Allergies and medications reviewed and updated.  Review of Systems  Constitutional:  Negative for chills and fever.  HENT:  Positive for dental problem. Negative for ear discharge.   Respiratory:  Negative for cough and shortness of breath.   Cardiovascular:  Negative for chest pain and palpitations.  Musculoskeletal:  Negative for myalgias.  Skin:  Positive for rash. Negative for color change.  Neurological:  Negative for numbness.    Per HPI unless specifically indicated above   Allergies as of 12/02/2021       Reactions   Nsaids Other (See Comments)   AVOID all NSAID due to history of GI bleeding   Atorvastatin Other (See Comments)   Joint & muscle  pain        Medication List        Accurate as of December 02, 2021 10:31 AM. If you have any questions, ask your nurse or doctor.          amLODipine 5 MG tablet Commonly known as: NORVASC TAKE ONE TABLET BY MOUTH ONCE DAILY   amoxicillin 500 MG capsule Commonly known as: AMOXIL Take 1 capsule (500 mg total) by mouth 2 (two) times daily.   betamethasone valerate 0.1 % cream Commonly known as: VALISONE Apply 1 application. topically 2 (two) times daily. prn   cetirizine 10 MG tablet Commonly known as: ZYRTEC Take 10 mg by mouth daily.   chlorthalidone 25 MG tablet Commonly known as: HYGROTON Take 1 tablet (25 mg total) by mouth daily.   clopidogrel 75 MG tablet Commonly known as: PLAVIX TAKE ONE TABLET BY MOUTH DAILY   diclofenac Sodium 1 % Gel Commonly known as: Voltaren Apply 2 g topically 4 (four) times daily.   furosemide 40 MG tablet Commonly known as: LASIX Take 40 mg by mouth daily as needed (weight gain).   gabapentin 100 MG capsule Commonly known as: NEURONTIN Take 1 capsule (100 mg total) by mouth 3 (three) times daily.   hydrocortisone 2.5 % rectal cream Commonly known as: ANUSOL-HC Place 1 application rectally 2 (two) times daily. As  needed for rectal discomfort.   lisinopril 40 MG tablet Commonly known as: ZESTRIL Take 1 tablet (40 mg total) by mouth daily.   metoprolol succinate 50 MG 24 hr tablet Commonly known as: TOPROL-XL Take 1 tablet (50 mg total) by mouth daily. Take with or immediately following a meal.   montelukast 10 MG tablet Commonly known as: SINGULAIR Take 1 tablet (10 mg total) by mouth at bedtime.   multivitamin tablet Take 1 tablet by mouth daily.   pantoprazole 40 MG tablet Commonly known as: PROTONIX Take 1 tablet (40 mg total) by mouth as needed. 30 minutes before breakfast   rosuvastatin 20 MG tablet Commonly known as: Crestor Take 1 tablet (20 mg total) by mouth daily.   tizanidine 2 MG capsule Commonly  known as: ZANAFLEX Take 1 capsule (2 mg total) by mouth at bedtime as needed for muscle spasms.   triamcinolone 0.025 % ointment Commonly known as: KENALOG Apply 1 application topically 2 (two) times daily.         Objective:   BP (!) 156/79   Pulse 87   Temp 97.6 F (36.4 C)   Ht 5\' 6"  (1.676 m)   Wt 234 lb (106.1 kg)   SpO2 99%   BMI 37.77 kg/m   Wt Readings from Last 3 Encounters:  12/02/21 234 lb (106.1 kg)  10/30/21 235 lb (106.6 kg)  10/29/21 236 lb (107 kg)    Physical Exam Vitals and nursing note reviewed.  Constitutional:      Appearance: Normal appearance.  HENT:     Right Ear: Tympanic membrane, ear canal and external ear normal.     Left Ear: Tympanic membrane, ear canal and external ear normal.     Mouth/Throat:     Dentition: Dental tenderness and dental caries present.  Cardiovascular:     Rate and Rhythm: Normal rate.  Pulmonary:     Effort: Pulmonary effort is normal.  Skin:    General: Skin is warm and dry.     Findings: No rash.  Neurological:     Mental Status: She is alert.     Assessment & Plan:   Problem List Items Addressed This Visit   None Visit Diagnoses     Other atopic dermatitis    -  Primary   Relevant Medications   montelukast (SINGULAIR) 10 MG tablet   Dental infection       Relevant Medications   amoxicillin (AMOXIL) 500 MG capsule     We will start singulair 10 mg once daily since she likely has atopic dermatitis. Patient was advised to continue using OTC moisturizers as needed. We will also prescribe amoxicillin given her dental caries and gum tenderness.  Follow up plan: Return if symptoms worsen or fail to improve.   Arlan Organ, Medical Student 12/02/2021, 10:31 AM

## 2021-12-17 ENCOUNTER — Ambulatory Visit (INDEPENDENT_AMBULATORY_CARE_PROVIDER_SITE_OTHER): Payer: Medicare Other | Admitting: Licensed Clinical Social Worker

## 2021-12-17 ENCOUNTER — Telehealth: Payer: Self-pay | Admitting: Family Medicine

## 2021-12-17 DIAGNOSIS — E782 Mixed hyperlipidemia: Secondary | ICD-10-CM

## 2021-12-17 DIAGNOSIS — N1832 Chronic kidney disease, stage 3b: Secondary | ICD-10-CM

## 2021-12-17 DIAGNOSIS — K219 Gastro-esophageal reflux disease without esophagitis: Secondary | ICD-10-CM

## 2021-12-17 DIAGNOSIS — I2510 Atherosclerotic heart disease of native coronary artery without angina pectoris: Secondary | ICD-10-CM

## 2021-12-17 DIAGNOSIS — I10 Essential (primary) hypertension: Secondary | ICD-10-CM

## 2021-12-17 DIAGNOSIS — D508 Other iron deficiency anemias: Secondary | ICD-10-CM

## 2021-12-17 DIAGNOSIS — F419 Anxiety disorder, unspecified: Secondary | ICD-10-CM

## 2021-12-17 DIAGNOSIS — I252 Old myocardial infarction: Secondary | ICD-10-CM

## 2021-12-17 NOTE — Chronic Care Management (AMB) (Signed)
Chronic Care Management    Clinical Social Work Note  12/17/2021 Name: Shelly [name] MRN: [id number] DOB: 11-Sep-1940  Shelly [name] is a 81 y.o. year old female who is a primary care patient of [name], [name], MD. The CCM team was consulted to assist the patient with chronic disease management and/or care coordination needs related to: [employer] .   Engaged with patient by telephone for follow up visit in response to provider referral for social work chronic care management and care coordination services.   Consent to Services:  The patient was given information about Chronic Care Management services, agreed to services, and gave verbal consent prior to initiation of services.  Please see initial visit note for detailed documentation.   Patient agreed to services and consent obtained.   Assessment: Review of patient past medical history, allergies, medications, and health status, including review of relevant consultants reports was performed today as part of a comprehensive evaluation and provision of chronic care management and care coordination services.     SDOH (Social Determinants of Health) assessments and interventions performed:  SDOH Interventions    Flowsheet Row Most Recent Value  SDOH Interventions   Physical Activity Interventions Other (Comments)  [walking challenges. she uses a walker to help her walk]  Stress Interventions Provide Counseling  [client has stress related to managing medical needs]  Depression Interventions/Treatment  Counseling        Advanced Directives Status: See Vynca application for related entries.  CCM Care Plan  Allergies  Allergen Reactions   Nsaids Other (See Comments)    AVOID all NSAID due to history of GI bleeding   Atorvastatin Other (See Comments)    Joint & muscle pain    Outpatient Encounter Medications as of 12/17/2021  Medication Sig   amLODipine (NORVASC) 5 MG tablet TAKE ONE TABLET BY MOUTH ONCE DAILY    amoxicillin (AMOXIL) 500 MG capsule Take 1 capsule (500 mg total) by mouth 2 (two) times daily.   betamethasone valerate (VALISONE) 0.1 % cream Apply 1 application. topically 2 (two) times daily. prn   cetirizine (ZYRTEC) 10 MG tablet Take 10 mg by mouth daily.   chlorthalidone (HYGROTON) 25 MG tablet Take 1 tablet (25 mg total) by mouth daily.   clopidogrel (PLAVIX) 75 MG tablet TAKE ONE TABLET BY MOUTH DAILY   diclofenac Sodium (VOLTAREN) 1 % GEL Apply 2 g topically 4 (four) times daily.   furosemide (LASIX) 40 MG tablet Take 40 mg by mouth daily as needed (weight gain).   gabapentin (NEURONTIN) 100 MG capsule Take 1 capsule (100 mg total) by mouth 3 (three) times daily.   hydrocortisone (ANUSOL-HC) 2.5 % rectal cream Place 1 application rectally 2 (two) times daily. As needed for rectal discomfort.   lisinopril (ZESTRIL) 40 MG tablet Take 1 tablet (40 mg total) by mouth daily.   metoprolol succinate (TOPROL-XL) 50 MG 24 hr tablet Take 1 tablet (50 mg total) by mouth daily. Take with or immediately following a meal.   montelukast (SINGULAIR) 10 MG tablet Take 1 tablet (10 mg total) by mouth at bedtime.   Multiple Vitamin (MULTIVITAMIN) tablet Take 1 tablet by mouth daily.   pantoprazole (PROTONIX) 40 MG tablet Take 1 tablet (40 mg total) by mouth as needed. 30 minutes before breakfast   rosuvastatin (CRESTOR) 20 MG tablet Take 1 tablet (20 mg total) by mouth daily.   tizanidine (ZANAFLEX) 2 MG capsule Take 1 capsule (2 mg total) by mouth at bedtime as needed  for muscle spasms.   triamcinolone (KENALOG) 0.025 % ointment Apply 1 application topically 2 (two) times daily.   No facility-administered encounter medications on file as of 12/17/2021.    Patient Active Problem List   Diagnosis Date Noted   Subclinical hyperthyroidism 06/06/2019   Normocytic anemia 06/02/2019   Postherpetic neuralgia 09/28/2018   Herpes zoster without complication 12/45/8099   Iron deficiency anemia 08/27/2016    Morbid obesity ([place]) 08/27/2016   CKD (chronic kidney disease), stage III ([place]) 04/04/2016   Osteoarthritis left knee 04/04/2016   Third degree heart block ([place]) 12/10/2015   Pancolonic diverticulosis 12/09/2015   CAD (coronary artery disease) 01/11/2014   History of non-ST elevation myocardial infarction (NSTEMI) 08/23/2013   IDA (iron deficiency anemia) 08/23/2013   Essential hypertension 08/22/2013   GERD (gastroesophageal reflux disease) 08/22/2013   Anxiety 08/22/2013    Conditions to be addressed/monitored: monitor client management of grief issues and of depression issues  Care Plan : LCSW Care Plan  Updates made by Katha Cabal, LCSW since 12/17/2021 12:00 AM     Problem: Depression Identification (Depression)      Goal: Depressive Symptoms Identified;Manage Depression symptoms and manage grief symptoms   Start Date: 08/07/2021  Expected End Date: 02/10/2022  This Visit's Progress: On track  Recent Progress: On track  Priority: Medium  Note:   Current Barriers:  Chronic Mental Health needs related to anxiety issues and grief issues Suicidal Ideation/Homicidal Ideation: No Depression issues Mobility issues Pain issues  Clinical Social Work Goal(s):  patient will work with SW monthly by telephone or in person to reduce or manage symptoms related to anxiety and grief issues of client patient will attend all scheduled medical appointments as evidenced by patient report and care team review of appointment completion in EMR:   Patient will practice self care techniques in next 30 days to help manage anxiety issues.   Interventions:  1:1 collaboration with Dr. [name] [name] regarding development and update of comprehensive plan of care as evidenced by provider attestation and co-signature Discussed knee pain issues with client. She said she has knee pain occasionally in her left knee.  Discussed ambulation of client (client has a walker to use as needed; client  also has a cane to use as needed to help her walk) Provided counseling support for client Discussed appetite of client . She said she has a reduced appetite. Reviewed sleeping issues of client. She said she has difficulty sleeping. Reviewed mood of client. She said she thought her mood was stable. She did not mention any mood issues. She said she enjoys talking daily via phone with her sister. She has support from her son. She has support from her two granddaughters who reside with her Reviewed medication procurement of client. Reviewed edema issues. She said she has occasional swelling in her ankles or feet Discussed SW needs of client. LCSW has worked with client related to SW needs for a number of months and client is stable with CCM SW needs at present. She has strong family support. LCSW informed Tansy that LCSW would discharge her today from [facility] since she is stable with SW services. LCSW also talked with client about Care Coordination Services through [facility]. LCSW informed client of RN Joellyn Quails support as nurse and informed client of Nat Christen LCSW support as SW.  LCSW thanked client for her participation in [facility] and nursing services.  LCSW is discharging client today from [place].  Patient Self Care Activities:  Eats meals independently Takes medications as prescribed Attends scheduled medical appointments  Patient Coping Strengths:  Completes ADLs Completes IADLS Makes transport arrangements as needed Attends medical appointments Communicates with LCSW or RNCM as needed for CCM support  Patient Self Care Deficits:  Grief issues Depression issues  Patient Goals:  - spend time or talk with others every day - practice relaxation or meditation daily - keep a calendar with appointment dates  Follow Up Plan: LCSW is discharging client today from [place]. Client agreed with this plan.      Norva Riffle.Elide Stalzer MSW, [place] Artist [facility] Care Management [phone]

## 2021-12-17 NOTE — Patient Instructions (Addendum)
Visit Information  Patient Goals:  Manage My emotions (patient). Manage Grief issues faced. Manage depression issues faced  Timeframe:  Short-Term Goal Priority:  Medium Progress: On Track Start Date:  08/07/21                            Expected End Date:  02/10/22                        Follow Up Date   LCSW is discharging client today from [place] My Emotions (Patient) Manage Grief issues faced ; Manage depression issues faced   Why is this important?   When you are stressed, down or upset, your body reacts too.  For example, your blood pressure may get higher; you may have a headache or stomachache.  When your emotions get the best of you, your body's ability to fight off cold and flu gets weak.  These steps will help you manage your emotions.     Patient Self Care Activities:  Eats meals independently Takes medications as prescribed Attends scheduled medical appointments  Patient Coping Strengths:  Completes ADLs Completes IADLS Makes transport arrangements as needed Attends medical appointments Communicates with LCSW or RNCM as needed for CCM support  Patient Self Care Deficits:  Grief issues Depression issues  Patient Goals:  - spend time or talk with others every day - practice relaxation or meditation daily - keep a calendar with appointment dates Talk with RNCM or LCSW as needed for support  Follow Up Plan: LCSW  is discharging client today from [place].   Norva Riffle.Yalda Herd MSW, [place] [employer] representative [facility] Care Management [phone]

## 2021-12-18 NOTE — Telephone Encounter (Signed)
Pt made aware and will wait until her next visit to discuss. States that she is at home alone more now and just wants to be careful.

## 2021-12-18 NOTE — Telephone Encounter (Signed)
I do not know that insurance companies typically cover the life but we can try, also we can prescribe for the cane but both are durable medical equipment and so need to be prescribed during a visit, if she wants to she can make a sooner visit, if not we can discuss it at her next visit and prescribe it then

## 2022-01-16 DIAGNOSIS — E782 Mixed hyperlipidemia: Secondary | ICD-10-CM

## 2022-01-16 DIAGNOSIS — D508 Other iron deficiency anemias: Secondary | ICD-10-CM

## 2022-01-16 DIAGNOSIS — I252 Old myocardial infarction: Secondary | ICD-10-CM

## 2022-01-16 DIAGNOSIS — I2510 Atherosclerotic heart disease of native coronary artery without angina pectoris: Secondary | ICD-10-CM

## 2022-01-16 DIAGNOSIS — I10 Essential (primary) hypertension: Secondary | ICD-10-CM

## 2022-01-21 ENCOUNTER — Ambulatory Visit: Payer: Medicare Other | Admitting: Gastroenterology

## 2022-01-21 ENCOUNTER — Other Ambulatory Visit: Payer: Self-pay | Admitting: Family Medicine

## 2022-01-30 ENCOUNTER — Ambulatory Visit: Payer: Medicare Other | Admitting: Nurse Practitioner

## 2022-01-31 ENCOUNTER — Ambulatory Visit (INDEPENDENT_AMBULATORY_CARE_PROVIDER_SITE_OTHER): Payer: Medicare Other | Admitting: Family Medicine

## 2022-01-31 ENCOUNTER — Encounter: Payer: Self-pay | Admitting: Family Medicine

## 2022-01-31 VITALS — BP 114/68 | HR 68 | Temp 98.0°F | Wt 236.0 lb

## 2022-01-31 DIAGNOSIS — I2510 Atherosclerotic heart disease of native coronary artery without angina pectoris: Secondary | ICD-10-CM | POA: Diagnosis not present

## 2022-01-31 DIAGNOSIS — E0590 Thyrotoxicosis, unspecified without thyrotoxic crisis or storm: Secondary | ICD-10-CM | POA: Diagnosis not present

## 2022-01-31 DIAGNOSIS — N1832 Chronic kidney disease, stage 3b: Secondary | ICD-10-CM

## 2022-01-31 DIAGNOSIS — I10 Essential (primary) hypertension: Secondary | ICD-10-CM

## 2022-01-31 DIAGNOSIS — R7303 Prediabetes: Secondary | ICD-10-CM | POA: Insufficient documentation

## 2022-01-31 LAB — BAYER DCA HB A1C WAIVED: HB A1C (BAYER DCA - WAIVED): 5.9 % — ABNORMAL HIGH (ref 4.8–5.6)

## 2022-01-31 NOTE — Progress Notes (Signed)
BP 114/68   Pulse 68   Temp 98 F (36.7 C)   Wt 236 lb (107 kg)   SpO2 98%   BMI 38.09 kg/m    Subjective:   Patient ID: Shelly [name], female    DOB: [birth year], 81 y.o.   MRN: [id number]  HPI: KASH MOTHERSHEAD is a 81 y.o. female presenting on 01/31/2022 for Medical Management of Chronic Issues, Diabetes, Hypertension, and Hypothyroidism   HPI Prediabetes Patient comes in today for recheck of his diabetes. Patient has been currently taking no medication currently, doing well diet control. Patient is currently on an ACE inhibitor/ARB. Patient has not seen an ophthalmologist this year. Patient denies any issues with their feet. The symptom started onset as an adult hypertension and hypothyroidism and CAD ARE RELATED TO DM   Hypertension and CAD Patient is currently on amlodipine chlorthalidone and furosemide and lisinopril and metoprolol, and their blood pressure today is 114/68. Patient denies any lightheadedness or dizziness. Patient denies headaches, blurred vision, chest pains, shortness of breath, or weakness. Denies any side effects from medication and is content with current medication.   Hypothyroidism recheck Patient is coming in for thyroid recheck today as well. They deny any issues with hair changes or heat or cold problems or diarrhea or constipation. They deny any chest pain or palpitations. They are currently on no medication currently   Relevant past medical, surgical, family and social history reviewed and updated as indicated. Interim medical history since our last visit reviewed. Allergies and medications reviewed and updated.  Review of Systems  Constitutional:  Negative for chills and fever.  Eyes:  Negative for visual disturbance.  Respiratory:  Negative for chest tightness and shortness of breath.   Cardiovascular:  Negative for chest pain and leg swelling.  Musculoskeletal:  Negative for back pain and gait problem.  Skin:  Negative for rash.   Neurological:  Negative for dizziness, light-headedness and headaches.  Psychiatric/Behavioral:  Negative for agitation and behavioral problems.   All other systems reviewed and are negative.   Per HPI unless specifically indicated above   Allergies as of 01/31/2022       Reactions   Nsaids Other (See Comments)   AVOID all NSAID due to history of GI bleeding   Atorvastatin Other (See Comments)   Joint & muscle pain        Medication List        Accurate as of January 31, 2022  3:53 PM. If you have any questions, ask your nurse or doctor.          amLODipine 5 MG tablet Commonly known as: NORVASC TAKE ONE TABLET BY MOUTH ONCE DAILY   amoxicillin 500 MG capsule Commonly known as: AMOXIL Take 1 capsule (500 mg total) by mouth 2 (two) times daily.   betamethasone valerate 0.1 % cream Commonly known as: VALISONE Apply 1 application. topically 2 (two) times daily. prn   cetirizine 10 MG tablet Commonly known as: ZYRTEC Take 10 mg by mouth daily.   chlorthalidone 25 MG tablet Commonly known as: HYGROTON Take 1 tablet (25 mg total) by mouth daily.   clopidogrel 75 MG tablet Commonly known as: PLAVIX TAKE ONE TABLET BY MOUTH DAILY   diclofenac Sodium 1 % Gel Commonly known as: Voltaren Apply 2 g topically 4 (four) times daily.   furosemide 40 MG tablet Commonly known as: LASIX Take 40 mg by mouth daily as needed (weight gain).   gabapentin 100 MG capsule Commonly known  as: NEURONTIN Take 1 capsule (100 mg total) by mouth 3 (three) times daily.   hydrocortisone 2.5 % rectal cream Commonly known as: ANUSOL-HC Place 1 application rectally 2 (two) times daily. As needed for rectal discomfort.   lisinopril 40 MG tablet Commonly known as: ZESTRIL Take 1 tablet (40 mg total) by mouth daily.   metoprolol succinate 50 MG 24 hr tablet Commonly known as: TOPROL-XL Take 1 tablet (50 mg total) by mouth daily. Take with or immediately following a meal.    montelukast 10 MG tablet Commonly known as: SINGULAIR Take 1 tablet (10 mg total) by mouth at bedtime.   multivitamin tablet Take 1 tablet by mouth daily.   pantoprazole 40 MG tablet Commonly known as: PROTONIX Take 1 tablet (40 mg total) by mouth as needed. 30 minutes before breakfast   rosuvastatin 20 MG tablet Commonly known as: Crestor Take 1 tablet (20 mg total) by mouth daily.   tizanidine 2 MG capsule Commonly known as: ZANAFLEX Take 1 capsule (2 mg total) by mouth at bedtime as needed for muscle spasms.   triamcinolone 0.025 % ointment Commonly known as: KENALOG Apply 1 application topically 2 (two) times daily.         Objective:   BP 114/68   Pulse 68   Temp 98 F (36.7 C)   Wt 236 lb (107 kg)   SpO2 98%   BMI 38.09 kg/m   Wt Readings from Last 3 Encounters:  01/31/22 236 lb (107 kg)  12/02/21 234 lb (106.1 kg)  10/30/21 235 lb (106.6 kg)    Physical Exam Vitals and nursing note reviewed.  Constitutional:      General: She is not in acute distress.    Appearance: She is well-developed. She is not diaphoretic.  Eyes:     Conjunctiva/sclera: Conjunctivae normal.  Cardiovascular:     Rate and Rhythm: Normal rate and regular rhythm.     Heart sounds: Normal heart sounds. No murmur heard. Pulmonary:     Effort: Pulmonary effort is normal. No respiratory distress.     Breath sounds: Normal breath sounds. No wheezing.  Musculoskeletal:        General: No swelling or tenderness. Normal range of motion.  Skin:    General: Skin is warm and dry.     Findings: No rash.  Neurological:     Mental Status: She is alert and oriented to person, place, and time.     Coordination: Coordination normal.  Psychiatric:        Behavior: Behavior normal.       Assessment & Plan:   Problem List Items Addressed This Visit       Cardiovascular and Mediastinum   Essential hypertension - Primary   Relevant Orders   Thyroid Panel With TSH   Lipid panel    CMP14+EGFR   CBC with Differential/Platelet   CAD (coronary artery disease)   Relevant Orders   Thyroid Panel With TSH   Lipid panel   CMP14+EGFR   CBC with Differential/Platelet     Endocrine   Subclinical hyperthyroidism   Relevant Orders   Thyroid Panel With TSH   Lipid panel   CMP14+EGFR   CBC with Differential/Platelet     Genitourinary   CKD (chronic kidney disease), stage III (HCC)   Relevant Orders   Thyroid Panel With TSH   Lipid panel   CMP14+EGFR   CBC with Differential/Platelet     Other   Prediabetes   Relevant Orders  Bayer DCA Hb A1c Waived    We will do blood work, blood pressure looks good, no changes.  She says the gabapentin is not helpful for neuropathy but she did not tolerate it so she will try doubling the gabapentin at nighttime and see how that does and let us know over the next week and if it does well then we can increase her dose. Follow up plan: Return in about 3 months (around 05/02/2022), or if symptoms worsen or fail to improve, for Diabetes and hypertension and thyroid.  Counseling provided for all of the vaccine components Orders Placed This Encounter  Procedures   Thyroid Panel With TSH   Lipid panel   CMP14+EGFR   CBC with Differential/Platelet   Bayer DCA Hb A1c Waived    Caryl Pina, MD [facility] 01/31/2022, 3:53 PM

## 2022-02-01 LAB — CMP14+EGFR
ALT: 6 IU/L (ref 0–32)
AST: 12 IU/L (ref 0–40)
Albumin/Globulin Ratio: 1.2 (ref 1.2–2.2)
Albumin: 3.7 g/dL — ABNORMAL LOW (ref 3.8–4.8)
Alkaline Phosphatase: 86 IU/L (ref 44–121)
BUN/Creatinine Ratio: 10 — ABNORMAL LOW (ref 12–28)
BUN: 17 mg/dL (ref 8–27)
Bilirubin Total: 0.2 mg/dL (ref 0.0–1.2)
CO2: 21 mmol/L (ref 20–29)
Calcium: 10 mg/dL (ref 8.7–10.3)
Chloride: 110 mmol/L — ABNORMAL HIGH (ref 96–106)
Creatinine, Ser: 1.65 mg/dL — ABNORMAL HIGH (ref 0.57–1.00)
Globulin, Total: 3.1 g/dL (ref 1.5–4.5)
Glucose: 112 mg/dL — ABNORMAL HIGH (ref 70–99)
Potassium: 5.2 mmol/L (ref 3.5–5.2)
Sodium: 143 mmol/L (ref 134–144)
Total Protein: 6.8 g/dL (ref 6.0–8.5)
eGFR: 31 mL/min/{1.73_m2} — ABNORMAL LOW (ref >59)

## 2022-02-01 LAB — LIPID PANEL
Chol/HDL Ratio: 3.4 ratio (ref 0.0–4.4)
Cholesterol, Total: 155 mg/dL (ref 100–199)
HDL: 46 mg/dL (ref >39)
LDL Chol Calc (NIH): 91 mg/dL (ref 0–99)
Triglycerides: 99 mg/dL (ref 0–149)
VLDL Cholesterol Cal: 18 mg/dL (ref 5–40)

## 2022-02-01 LAB — CBC WITH DIFFERENTIAL/PLATELET
Basophils Absolute: 0 10*3/uL (ref 0.0–0.2)
Basos: 1 %
EOS (ABSOLUTE): 0.1 10*3/uL (ref 0.0–0.4)
Eos: 2 %
Hematocrit: 32.4 % — ABNORMAL LOW (ref 34.0–46.6)
Hemoglobin: 10.6 g/dL — ABNORMAL LOW (ref 11.1–15.9)
Immature Grans (Abs): 0 10*3/uL (ref 0.0–0.1)
Immature Granulocytes: 0 %
Lymphocytes Absolute: 2.1 10*3/uL (ref 0.7–3.1)
Lymphs: 40 %
MCH: 30.3 pg (ref 26.6–33.0)
MCHC: 32.7 g/dL (ref 31.5–35.7)
MCV: 93 fL (ref 79–97)
Monocytes Absolute: 0.5 10*3/uL (ref 0.1–0.9)
Monocytes: 10 %
Neutrophils Absolute: 2.6 10*3/uL (ref 1.4–7.0)
Neutrophils: 47 %
Platelets: 275 10*3/uL (ref 150–450)
RBC: 3.5 x10E6/uL — ABNORMAL LOW (ref 3.77–5.28)
RDW: 13.3 % (ref 11.7–15.4)
WBC: 5.4 10*3/uL (ref 3.4–10.8)

## 2022-02-01 LAB — THYROID PANEL WITH TSH
Free Thyroxine Index: 1.4 (ref 1.2–4.9)
T3 Uptake Ratio: 23 % — ABNORMAL LOW (ref 24–39)
T4, Total: 6.1 ug/dL (ref 4.5–12.0)
TSH: 0.287 u[IU]/mL — ABNORMAL LOW (ref 0.450–4.500)

## 2022-02-03 DIAGNOSIS — I129 Hypertensive chronic kidney disease with stage 1 through stage 4 chronic kidney disease, or unspecified chronic kidney disease: Secondary | ICD-10-CM | POA: Diagnosis not present

## 2022-02-03 DIAGNOSIS — I5032 Chronic diastolic (congestive) heart failure: Secondary | ICD-10-CM | POA: Diagnosis not present

## 2022-02-03 DIAGNOSIS — N1832 Chronic kidney disease, stage 3b: Secondary | ICD-10-CM | POA: Diagnosis not present

## 2022-02-03 DIAGNOSIS — R809 Proteinuria, unspecified: Secondary | ICD-10-CM | POA: Diagnosis not present

## 2022-02-03 DIAGNOSIS — E0590 Thyrotoxicosis, unspecified without thyrotoxic crisis or storm: Secondary | ICD-10-CM | POA: Diagnosis not present

## 2022-02-04 LAB — TSH: TSH: 0.134 u[IU]/mL — ABNORMAL LOW (ref 0.450–4.500)

## 2022-02-04 LAB — T4, FREE: Free T4: 1.11 ng/dL (ref 0.82–1.77)

## 2022-02-04 LAB — T3, FREE: T3, Free: 3.1 pg/mL (ref 2.0–4.4)

## 2022-02-10 ENCOUNTER — Ambulatory Visit: Payer: Medicare Other | Attending: Cardiology | Admitting: Cardiology

## 2022-02-10 ENCOUNTER — Encounter: Payer: Self-pay | Admitting: Cardiology

## 2022-02-10 ENCOUNTER — Ambulatory Visit: Payer: Medicare Other | Admitting: Nurse Practitioner

## 2022-02-10 VITALS — BP 120/60 | HR 70 | Ht 64.0 in | Wt 236.8 lb

## 2022-02-10 DIAGNOSIS — E782 Mixed hyperlipidemia: Secondary | ICD-10-CM

## 2022-02-10 DIAGNOSIS — I2510 Atherosclerotic heart disease of native coronary artery without angina pectoris: Secondary | ICD-10-CM

## 2022-02-10 DIAGNOSIS — I10 Essential (primary) hypertension: Secondary | ICD-10-CM | POA: Diagnosis not present

## 2022-02-10 NOTE — Patient Instructions (Signed)

## 2022-02-10 NOTE — Progress Notes (Signed)
Clinical Summary Shelly [name] is a 81 y.o.female    1. CAD - history of NSTEMI, DES x 2 to [place] 08/2013 - she is on plavix for secondary prevention due to bleeding on ASA per Dr Shelly [name] notes   - no chest pains, no SOB/DOE - compliant with meds     2. Hyperlipidemia Shelly [name] aches on atorvastatin, has tolerated crestor 5mg    07/2020 TC 186 TG 113 HDL 48 LDL 118 01/2022 RC 155 TG 99 HDL 46 LDL 91 - has been able to go up on crestor, now on 20mg  daily.    3. HTN - norvasc caused LE edema, though retried during 04/24/20 appt with PA Shelly [name] - occasional leg swelling though infrequent  - compliant with meds    4. Postherpetic neuralgia   5. CKD 3 - follwed by Dr Shelly [name]     6. Bilatearl LE edema - 08/2019 echo LVEF 65-70%, grade I dd, normal RV function, moderate pulm HTN PASP 40     - overall doing well, takes fluid pill about two times per week.      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 s/p NSTEMI in 2015 with DES x2 to RCA   Cataract    Chronic kidney disease    Coronary artery disease    SEVERE   GERD (gastroesophageal reflux disease)    GI bleed 12/2013   Goiter    Hyperlipidemia    Hypertension    Myocardial infarct [facility])    Myocardial infarction ([place]) 08/2013   Neuralgia of right lower extremity 07/13/2017   Pancolonic diverticulosis 12/2013   Post herpetic neuralgia      Allergies  Allergen Reactions   Nsaids Other (See Comments)    AVOID all NSAID due to history of GI bleeding   Atorvastatin Other (See Comments)    Joint & muscle pain     Current Outpatient Medications  Medication Sig Dispense Refill   amLODipine (NORVASC) 5 MG tablet TAKE ONE TABLET BY MOUTH ONCE DAILY 90 tablet 3   betamethasone valerate  (VALISONE) 0.1 % cream Apply 1 application. topically 2 (two) times daily. prn 30 g 1   cetirizine (ZYRTEC) 10 MG tablet Take 10 mg by mouth daily.     chlorthalidone (HYGROTON) 25 MG tablet Take 1 tablet (25 mg total) by mouth daily. 90 tablet 3   clopidogrel (PLAVIX) 75 MG tablet TAKE ONE TABLET BY MOUTH DAILY 90 tablet 0   diclofenac Sodium (VOLTAREN) 1 % GEL Apply 2 g topically 4 (four) times daily. 350 g 3   furosemide (LASIX) 40 MG tablet Take 40 mg by mouth daily as needed (weight gain).     gabapentin (NEURONTIN) 100 MG capsule Take 1 capsule (100 mg total) by mouth 3 (three) times daily. 270 capsule 3   hydrocortisone (ANUSOL-HC) 2.5 % rectal cream Place 1 application rectally 2 (two) times daily. As needed for rectal discomfort. 30 g 1   lisinopril (ZESTRIL) 40 MG tablet Take 1 tablet (40 mg total) by mouth daily. 90 tablet 3   metoprolol succinate (TOPROL-XL) 50 MG 24 hr tablet Take 1 tablet (50 mg total) by mouth daily. Take with or  immediately following a meal. 90 tablet 3   montelukast (SINGULAIR) 10 MG tablet Take 1 tablet (10 mg total) by mouth at bedtime. 90 tablet 1   Multiple Vitamin (MULTIVITAMIN) tablet Take 1 tablet by mouth daily.     pantoprazole (PROTONIX) 40 MG tablet Take 1 tablet (40 mg total) by mouth as needed. 30 minutes before breakfast 90 tablet 3   rosuvastatin (CRESTOR) 20 MG tablet Take 1 tablet (20 mg total) by mouth daily. 90 tablet 3   tizanidine (ZANAFLEX) 2 MG capsule Take 1 capsule (2 mg total) by mouth at bedtime as needed for muscle spasms. 30 capsule 1   triamcinolone (KENALOG) 0.025 % ointment Apply 1 application topically 2 (two) times daily. 60 g 1   No current facility-administered medications for this visit.     Past Surgical History:  Procedure Laterality Date   CARPAL TUNNEL RELEASE Bilateral    COLONOSCOPY N/A 01/13/2014   Dr. Hung:pandiverticulosis    COLONOSCOPY N/A 12/14/2015   Dr. Michail [name]: pancolonic diverticulosis, internal  hemorrhoids    CORNEAL TRANSPLANT     CORONARY ANGIOPLASTY  08/2013   ESOPHAGOGASTRODUODENOSCOPY N/A 12/14/2015   Dr. Michail [name]: normal    GIVENS CAPSULE STUDY N/A 08/13/2016   Dr. Oneida [name]: enteritis due to aspirin.    HEEL SPUR EXCISION     LEFT HEART CATHETERIZATION WITH CORONARY ANGIOGRAM N/A 08/23/2013   Procedure: LEFT HEART CATHETERIZATION WITH CORONARY ANGIOGRAM;  Surgeon: Shelly M Martinique, MD;  Location: [facility] CATH LAB;  Service: Cardiovascular;  Laterality: N/A;   PERCUTANEOUS CORONARY STENT INTERVENTION (PCI-S) N/A 08/24/2013   Procedure: PERCUTANEOUS CORONARY STENT INTERVENTION (PCI-S);  Surgeon: Shelly M Martinique, MD;  [facility]: [facility] CATH LAB;  Service: Cardiovascular;  Laterality: N/A;   TONSILLECTOMY     TUBAL LIGATION       Allergies  Allergen Reactions   Nsaids Other (See Comments)    AVOID all NSAID due to history of GI bleeding   Atorvastatin Other (See Comments)    Joint & muscle pain      Family History  Problem Relation Age of Onset   Hypertension Brother    Transient ischemic attack Brother    Cancer Mother        unsure of type    Other Father        grangrene    Asthma Sister    Stroke Sister    COPD Sister    Stroke Sister    Early death Brother    Early death Brother    Liver cancer Daughter    Cancer Daughter    Hernia Son        Umbilical   GI problems Son    Pancreatic disease Daughter        pancreatectomy   Cancer Daughter    Diverticulitis Daughter    Arthritis Daughter        knee    Arthritis Daughter        shoulder    Anemia Daughter    GER disease Son    Colon cancer Neg Hx    Colon polyps Neg Hx      Social History Shelly [name] reports that she has never smoked. She has never used smokeless tobacco. Shelly [name] reports no history of alcohol use.   Review of Systems CONSTITUTIONAL: No weight loss, fever, chills, weakness or fatigue.  HEENT: Eyes: No visual loss, blurred vision, double vision or yellow sclerae.No hearing loss,  sneezing, congestion, runny nose or sore throat.  SKIN:  No rash or itching.  CARDIOVASCULAR: per hpi RESPIRATORY: No shortness of breath, cough or sputum.  GASTROINTESTINAL: No anorexia, nausea, vomiting or diarrhea. No abdominal pain or blood.  GENITOURINARY: No burning on urination, no polyuria NEUROLOGICAL: No headache, dizziness, syncope, paralysis, ataxia, numbness or tingling in the extremities. No change in bowel or bladder control.  MUSCULOSKELETAL: No muscle, back pain, joint pain or stiffness.  LYMPHATICS: No enlarged nodes. No history of splenectomy.  PSYCHIATRIC: No history of depression or anxiety.  ENDOCRINOLOGIC: No reports of sweating, cold or heat intolerance. No polyuria or polydipsia.  Marland Kitchen   Physical Examination Vitals:   02/10/22 1445 02/10/22 1505  BP: (!) 146/60 120/60  Pulse: 70   SpO2: 97%    Filed Weights   02/10/22 1445  Weight: 236 lb 12.8 oz (107.4 kg)    Gen: resting comfortably, no acute distress HEENT: no scleral icterus, pupils equal round and reactive, no palptable cervical adenopathy,  CV: RRR, no m/rg, no jvd Resp: Clear to auscultation bilaterally GI: abdomen is soft, non-tender, non-distended, normal bowel sounds, no hepatosplenomegaly MSK: extremities are warm, no edema.  Skin: warm, no rash Neuro:  no focal deficits Psych: appropriate affect     Assessment and Plan   1. CAD - no symptoms, continue curren tmeds   2. Hyperlipidemia - difficultly tolerating lipitor in the past, tolerating crestor currently on 20mg  daily with recent increase by pcp. Monitor at this time, goal LDL would be <70   3. HTN - at goal on manual recheck, continue current meds    Arnoldo Lenis, M.D.,

## 2022-02-12 DIAGNOSIS — I5032 Chronic diastolic (congestive) heart failure: Secondary | ICD-10-CM | POA: Diagnosis not present

## 2022-02-12 DIAGNOSIS — N1832 Chronic kidney disease, stage 3b: Secondary | ICD-10-CM | POA: Diagnosis not present

## 2022-02-12 DIAGNOSIS — R809 Proteinuria, unspecified: Secondary | ICD-10-CM | POA: Diagnosis not present

## 2022-02-12 DIAGNOSIS — D638 Anemia in other chronic diseases classified elsewhere: Secondary | ICD-10-CM | POA: Diagnosis not present

## 2022-02-12 DIAGNOSIS — I129 Hypertensive chronic kidney disease with stage 1 through stage 4 chronic kidney disease, or unspecified chronic kidney disease: Secondary | ICD-10-CM | POA: Diagnosis not present

## 2022-02-18 ENCOUNTER — Other Ambulatory Visit: Payer: Self-pay | Admitting: Cardiology

## 2022-02-19 ENCOUNTER — Encounter: Payer: Self-pay | Admitting: Nurse Practitioner

## 2022-02-19 ENCOUNTER — Ambulatory Visit: Payer: Medicare Other | Admitting: Nurse Practitioner

## 2022-02-19 VITALS — BP 124/63 | HR 66 | Ht 64.0 in | Wt 238.8 lb

## 2022-02-19 DIAGNOSIS — E0590 Thyrotoxicosis, unspecified without thyrotoxic crisis or storm: Secondary | ICD-10-CM | POA: Diagnosis not present

## 2022-02-19 NOTE — Progress Notes (Signed)
02/19/2022, 3:24 PM                                     Endocrinology follow-up note    Subjective:    Patient ID: Shelly [name], female    DOB: Jan 03, 1941, PCP [name], [name], MD   Past Medical History:  Diagnosis Date   Allergy    Anemia    GI bleed   Anginal pain ([place])    Anxiety    Arthritis    Blood transfusion without reported diagnosis    CAD (coronary artery disease)    a. s/p NSTEMI in 2015 with DES x2 to RCA   Cataract    Chronic kidney disease    Coronary artery disease    SEVERE   GERD (gastroesophageal reflux disease)    GI bleed 12/2013   Goiter    Hyperlipidemia    Hypertension    Myocardial infarct [facility])    Myocardial infarction ([place]) 08/2013   Neuralgia of right lower extremity 07/13/2017   Pancolonic diverticulosis 12/2013   Post herpetic neuralgia    Past Surgical History:  Procedure Laterality Date   CARPAL TUNNEL RELEASE Bilateral    COLONOSCOPY N/A 01/13/2014   Dr. Hung:pandiverticulosis    COLONOSCOPY N/A 12/14/2015   Dr. Michail Sermon: pancolonic diverticulosis, internal hemorrhoids    CORNEAL TRANSPLANT     CORONARY ANGIOPLASTY  08/2013   ESOPHAGOGASTRODUODENOSCOPY N/A 12/14/2015   Dr. Michail Sermon: normal    GIVENS CAPSULE STUDY N/A 08/13/2016   Dr. Oneida Alar: enteritis due to aspirin.    HEEL SPUR EXCISION     LEFT HEART CATHETERIZATION WITH CORONARY ANGIOGRAM N/A 08/23/2013   Procedure: LEFT HEART CATHETERIZATION WITH CORONARY ANGIOGRAM;  Surgeon: Peter M Martinique, MD;  Location: [facility] CATH LAB;  Service: Cardiovascular;  Laterality: N/A;   PERCUTANEOUS CORONARY STENT INTERVENTION (PCI-S) N/A 08/24/2013   Procedure: PERCUTANEOUS CORONARY STENT INTERVENTION (PCI-S);  Surgeon: Peter M Martinique, MD;  Location: [facility] CATH LAB;  Service: Cardiovascular;  Laterality: N/A;   TONSILLECTOMY     TUBAL LIGATION     Social History   Socioeconomic History   Marital status: Divorced    Spouse name: Not on file    Number of children: 8   Years of education: 11   Highest education level: 11th grade  Occupational History   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   No daily caffeine use.   2 story home but she only has to use one level   Social Determinants of Radio broadcast assistant Strain: Low Risk  (  08/08/2021)   Overall Financial Resource Strain (CARDIA)    Difficulty of Paying Living Expenses: Not hard at all  Food Insecurity: No Food Insecurity (08/08/2021)   Hunger Vital Sign    Worried About Running Out of Food in the Last Year: Never true    Ran Out of Food in the Last Year: Never true  Transportation Needs: No Transportation Needs (08/08/2021)   PRAPARE - Hydrologist (Medical): No    Lack of Transportation (Non-Medical): No  Physical Activity: Inactive (12/17/2021)   Exercise Vital Sign    Days of Exercise per Week: 0 days    Minutes of Exercise per Session: 0 min  Stress: Stress Concern Present (12/17/2021)   [place]    Feeling of Stress : To some extent  Social Connections: Socially Isolated (08/08/2021)   Social Connection and Isolation Panel [NHANES]    Frequency of Communication with Friends and Family: More than three times a week    Frequency of Social Gatherings with Friends and Family: More than three times a week    Attends Religious Services: Never    Building surveyor or Organizations: No    Attends Archivist Meetings: Never    Marital Status: Widowed   Family History  Problem Relation Age of Onset   Hypertension Brother    Transient ischemic attack Brother    Cancer Mother        unsure of type    Other Father        grangrene    Asthma Sister    Stroke Sister    COPD Sister    Stroke Sister    Early death Brother    Early death Brother    Liver cancer Daughter    Cancer Daughter    Hernia Son        Umbilical   GI problems Son    Pancreatic disease Daughter        pancreatectomy   Cancer Daughter    Diverticulitis Daughter    Arthritis Daughter        knee    Arthritis Daughter        shoulder    Anemia Daughter    GER disease Son    Colon cancer Neg Hx    Colon polyps Neg Hx    Outpatient Encounter Medications as of 02/19/2022  Medication Sig   amLODipine (NORVASC) 5 MG tablet TAKE ONE TABLET BY MOUTH ONCE DAILY   betamethasone valerate (VALISONE) 0.1 % cream Apply 1 application. topically 2 (two) times daily. prn   cetirizine (ZYRTEC) 10 MG tablet Take 10 mg by mouth daily.   chlorthalidone (HYGROTON) 25 MG tablet Take 1 tablet (25 mg total) by mouth daily.   clopidogrel (PLAVIX) 75 MG tablet TAKE ONE TABLET BY MOUTH DAILY   furosemide (LASIX) 40 MG tablet Take 40 mg by mouth daily as needed (weight gain).   hydrocortisone (ANUSOL-HC) 2.5 % rectal cream Place 1 application rectally 2 (two) times daily. As needed for rectal discomfort.   lisinopril (ZESTRIL) 40 MG tablet Take 1 tablet (40 mg total) by mouth daily.   metoprolol succinate (TOPROL-XL) 50 MG 24 hr tablet Take 1 tablet (50 mg total) by mouth daily. Take with or immediately following a meal.   montelukast (SINGULAIR) 10 MG tablet Take 1 tablet (10 mg total) by mouth at bedtime.   Multiple Vitamin (MULTIVITAMIN) tablet Take 1 tablet  by mouth daily.   pantoprazole (PROTONIX) 40 MG tablet Take 1 tablet (40 mg total) by mouth as needed. 30  minutes before breakfast   rosuvastatin (CRESTOR) 20 MG tablet Take 1 tablet (20 mg total) by mouth daily.   tizanidine (ZANAFLEX) 2 MG capsule Take 1 capsule (2 mg total) by mouth at bedtime as needed for muscle spasms.   triamcinolone (KENALOG) 0.025 % ointment Apply 1 application topically 2 (two) times daily.   diclofenac Sodium (VOLTAREN) 1 % GEL Apply 2 g topically 4 (four) times daily. (Patient not taking: Reported on 02/19/2022)   gabapentin (NEURONTIN) 100 MG capsule Take 1 capsule (100 mg total) by mouth 3 (three) times daily.   No facility-administered encounter medications on file as of 02/19/2022.   ALLERGIES: Allergies  Allergen Reactions   Nsaids Other (See Comments)    AVOID all NSAID due to history of GI bleeding   Atorvastatin Other (See Comments)    Joint & muscle pain    VACCINATION STATUS: Immunization History  Administered Date(s) Administered   Pneumococcal Conjugate-13 10/02/2016   Pneumococcal Polysaccharide-23 08/25/2013   Zoster Recombinat (Shingrix) 03/17/2018    Thyroid Problem Presents for follow-up visit. Patient reports no anxiety, cold intolerance, constipation, depressed mood, fatigue, heat intolerance, leg swelling, palpitations, tremors, weight gain or weight loss. The symptoms have been stable.    Shelly [name] is 81 y.o. female who is seen in follow-up after she was seen in consultation for subclinical hyperthyroidism.    PMD: [name], [name], MD.  Due to septic symptoms of thyrotoxicosis, she was started on low-dose methimazole, which was since tapered down.  She has been off the Methimazole for some time now with stable thyroid response.  She denies any history of goiter, no family history of thyroid dysfunction or thyroid malignancy.  Her thyroid uptake and scan on June 14, 2019 showed 24-hour uptake of 10.3% which is normal.  There was asymmetric thyroid activity with generally increased uptake in the right lobe.  No focal hot  or cold nodules identified.  Her medical history includes hypertension and hyperlipidemia on treatment.  Review of systems  Constitutional: + Minimally fluctuating body weight,  current Body mass index is 40.99 kg/m. , no fatigue, no subjective hyperthermia, no subjective hypothermia Eyes: no blurry vision, no xerophthalmia ENT: no sore throat, no nodules palpated in throat, no dysphagia/odynophagia, no hoarseness Cardiovascular: no chest pain, no shortness of breath, no palpitations, no leg swelling Respiratory: no cough, no shortness of breath Gastrointestinal: no nausea/vomiting/diarrhea Musculoskeletal: no muscle/joint aches, walks with rollator Skin: no rashes, no hyperemia Neurological: no tremors, no numbness, no tingling, no dizziness Psychiatric: no depression, no anxiety  Objective:    BP 124/63 (BP Location: Right Arm, Patient Position: Sitting, Cuff Size: Large)   Pulse 66   Ht 5\' 4"  (1.626 m)   Wt 238 lb 12.8 oz (108.3 kg)   BMI 40.99 kg/m   Wt Readings from Last 3 Encounters:  02/19/22 238 lb 12.8 oz (108.3 kg)  02/10/22 236 lb 12.8 oz (107.4 kg)  01/31/22 236 lb (107 kg)    BP Readings from Last 3 Encounters:  02/19/22 124/63  02/10/22 120/60  01/31/22 114/68     Physical Exam- Limited  Constitutional:  Body mass index is 40.99 kg/m. , not in acute distress, normal state of mind Eyes:  EOMI, no exophthalmos Neck: Supple Musculoskeletal: no gross deformities, strength intact in all four extremities, no gross restriction of joint movements, walks with rollator Skin:  no rashes, no hyperemia Neurological: no tremor with outstretched hands    CMP     Component Value Date/Time   NA 143 01/31/2022 1557   K 5.2 01/31/2022 1557   CL 110 (H) 01/31/2022 1557   CO2 21 01/31/2022 1557   GLUCOSE 112 (H) 01/31/2022 1557   GLUCOSE 114 (H) 04/05/2021 1319   BUN 17 01/31/2022 1557   CREATININE 1.65 (H) 01/31/2022 1557   CREATININE 1.31 (H) 12/21/2018 1547    CALCIUM 10.0 01/31/2022 1557   PROT 6.8 01/31/2022 1557   ALBUMIN 3.7 (L) 01/31/2022 1557   AST 12 01/31/2022 1557   ALT 6 01/31/2022 1557   ALKPHOS 86 01/31/2022 1557   BILITOT 0.2 01/31/2022 1557   GFRNONAA 32 (L) 04/05/2021 1319   GFRAA 34 (L) 04/30/2020 1510     Diabetic Labs (most recent): Lab Results  Component Value Date   HGBA1C 5.9 (H) 01/31/2022   HGBA1C 6.0 (H) 12/09/2015   HGBA1C 6.0 (H) 08/23/2013   MICROALBUR 18.6 (H) 04/05/2021     Lipid Panel ( most recent) Lipid Panel     Component Value Date/Time   CHOL 155 01/31/2022 1557   TRIG 99 01/31/2022 1557   HDL 46 01/31/2022 1557   CHOLHDL 3.4 01/31/2022 1557   CHOLHDL 2.6 11/20/2016 0854   VLDL 11 11/20/2016 0854   LDLCALC 91 01/31/2022 1557   LABVLDL 18 01/31/2022 1557      Lab Results  Component Value Date   TSH 0.134 (L) 02/03/2022   TSH 0.287 (L) 01/31/2022   TSH 0.057 (L) 10/30/2021   TSH 0.062 (L) 10/17/2021   TSH 0.195 (L) 07/24/2021   TSH 0.277 (L) 06/27/2021   TSH 0.224 (L) 04/02/2021   TSH 0.120 (L) 02/08/2021   TSH 0.161 (L) 10/16/2020   TSH 0.227 (L) 07/04/2020   FREET4 1.11 02/03/2022   FREET4 1.11 10/17/2021   FREET4 0.96 07/24/2021   FREET4 0.88 04/02/2021   FREET4 1.02 10/16/2020   FREET4 1.00 07/04/2020   FREET4 0.97 05/03/2020   FREET4 1.0 01/02/2020   FREET4 0.9 09/23/2019   FREET4 0.9 05/30/2019     Latest Reference Range & Units 07/24/21 15:43 10/17/21 13:49 10/30/21 15:36 01/31/22 15:57 02/03/22 15:13  TSH 0.450 - 4.500 uIU/mL 0.195 (L) 0.062 (L) 0.057 (L) 0.287 (L) 0.134 (L)  Triiodothyronine,Free,Serum 2.0 - 4.4 pg/mL 2.7 2.6   3.1  T4,Free(Direct) 0.82 - 1.77 ng/dL 0.96 1.11   1.11  Thyroxine (T4) 4.5 - 12.0 ug/dL   6.1 6.1   Free Thyroxine Index 1.2 - 4.9    1.7 1.4   T3 Uptake Ratio 24 - 39 %   28 23 (L)   (L): Data is abnormally low  Assessment & Plan:   1. Subclinical hyperthyroidism  -Her most recent thyroid function tests are stable, TSH still minimally  suppressed but FT3 and FT4 are normal.  There is no need for her to restart Methimazole at this time.  Will monitor TFTs again in 3 months (patient prefers to have more frequent checkups than the 6 month I offered her).    - she is advised to maintain close follow up with [name], [name], MD for primary care needs.    I spent 20 minutes in the care of the patient today including review of labs from Thyroid Function, CMP, and other relevant labs ; imaging/biopsy records (current and previous including abstractions from other facilities); face-to-face time discussing  her lab results and symptoms, medications doses, her options  of short and long term treatment based on the latest standards of care / guidelines;   and documenting the encounter.  Shelly [name]  participated in the discussions, expressed understanding, and voiced agreement with the above plans.  All questions were answered to her satisfaction. she is encouraged to contact clinic should she have any questions or concerns prior to her return visit.   Follow up plan: Return in about 3 months (around 05/22/2022) for Thyroid follow up, Previsit labs.   Rayetta Pigg, [facility] [address] [place], [place] [address] Phone: [phone] Fax: [phone]   02/19/2022, 3:24 PM

## 2022-03-11 ENCOUNTER — Ambulatory Visit (INDEPENDENT_AMBULATORY_CARE_PROVIDER_SITE_OTHER): Payer: Medicare Other | Admitting: Gastroenterology

## 2022-03-11 ENCOUNTER — Encounter: Payer: Self-pay | Admitting: Gastroenterology

## 2022-03-11 VITALS — BP 146/76 | HR 74 | Temp 97.3°F | Ht 61.0 in | Wt 239.2 lb

## 2022-03-11 DIAGNOSIS — K219 Gastro-esophageal reflux disease without esophagitis: Secondary | ICD-10-CM | POA: Diagnosis not present

## 2022-03-11 MED ORDER — HYDROCORTISONE (PERIANAL) 2.5 % EX CREA: 1.0000 | TOPICAL_CREAM | Freq: Two times a day (BID) | CUTANEOUS | 1 refills | Status: DC

## 2022-03-11 NOTE — Patient Instructions (Signed)
I am glad you are doing well!  I will see you back in 3 months!  Have a wonderful holiday season!  I enjoyed seeing you again today! As you know, I value our relationship and want to provide genuine, compassionate, and quality care. I welcome your feedback. If you receive a survey regarding your visit,  I greatly appreciate you taking time to fill this out. See you next time!  Annitta Needs, PhD, ANP-BC [facility]

## 2022-03-11 NOTE — Progress Notes (Signed)
Gastroenterology Office Note     Primary Care Physician:  [name], [name], MD  Primary Gastroenterologist: Dr. Gala Romney    Chief Complaint   Chief Complaint  Patient presents with   Follow-up    Follow up on GERD     History of Present Illness   Shelly [name] is an 81 y.o. female presenting today in follow-up with a history of chronic GERD, diverticular bleed many years prior, IDA with capsule study showing enteritis due to aspirin use, and chronic normocytic anemia with chronic disease component.  She prefers frequent visits.  Eats cereal such as cheerio and cornflakes, which keeps her regular with BMs.   Would like to have on hand the Anusol.   GERD controlled by taking PPI just as needed. No dysphagia. No abdominal pain. Still improving with post-herpetic neuralgia. Neurontin takes the edge off of it. She enjoys spending time with her grandbabies.     Past Medical History:  Diagnosis Date   Allergy    Anemia    GI bleed   Anginal pain ([place])    Anxiety    Arthritis    Blood transfusion without reported diagnosis    CAD (coronary artery disease)    a. s/p NSTEMI in 2015 with DES x2 to RCA   Cataract    Chronic kidney disease    Coronary artery disease    SEVERE   GERD (gastroesophageal reflux disease)    GI bleed 12/2013   Goiter    Hyperlipidemia    Hypertension    Myocardial infarct [facility])    Myocardial infarction ([place]) 08/2013   Neuralgia of right lower extremity 07/13/2017   Pancolonic diverticulosis 12/2013   Post herpetic neuralgia     Past Surgical History:  Procedure Laterality Date   CARPAL TUNNEL RELEASE Bilateral    COLONOSCOPY N/A 01/13/2014   Dr. Hung:pandiverticulosis    COLONOSCOPY N/A 12/14/2015   Dr. Michail Sermon: pancolonic diverticulosis, internal hemorrhoids    CORNEAL TRANSPLANT     CORONARY ANGIOPLASTY  08/2013   ESOPHAGOGASTRODUODENOSCOPY N/A 12/14/2015   Dr. Michail Sermon: normal    GIVENS CAPSULE STUDY N/A 08/13/2016   Dr.  Oneida Alar: enteritis due to aspirin.    HEEL SPUR EXCISION     LEFT HEART CATHETERIZATION WITH CORONARY ANGIOGRAM N/A 08/23/2013   Procedure: LEFT HEART CATHETERIZATION WITH CORONARY ANGIOGRAM;  Surgeon: Peter M Martinique, MD;  Location[facility] CATH LAB;  Service: Cardiovascular;  Laterality: N/A;   PERCUTANEOUS CORONARY STENT INTERVENTION (PCI-S) N/A 08/24/2013   Procedure: PERCUTANEOUS CORONARY STENT INTERVENTION (PCI-S);  Surgeon: Peter M Martinique, MD;  Location: [facility] CATH LAB;  Service: Cardiovascular;  Laterality: N/A;   TONSILLECTOMY     TUBAL LIGATION      Current Outpatient Medications  Medication Sig Dispense Refill   amLODipine (NORVASC) 5 MG tablet TAKE ONE TABLET BY MOUTH ONCE DAILY 90 tablet 3   betamethasone valerate (VALISONE) 0.1 % cream Apply 1 application. topically 2 (two) times daily. prn 30 g 1   cetirizine (ZYRTEC) 10 MG tablet Take 10 mg by mouth daily.     chlorthalidone (HYGROTON) 25 MG tablet Take 1 tablet (25 mg total) by mouth daily. 90 tablet 3   clopidogrel (PLAVIX) 75 MG tablet TAKE ONE TABLET BY MOUTH DAILY 90 tablet 0   diclofenac Sodium (VOLTAREN) 1 % GEL Apply 2 g topically 4 (four) times daily. 350 g 3   furosemide (LASIX) 40 MG tablet Take 40 mg by mouth daily as needed (weight gain).  gabapentin (NEURONTIN) 100 MG capsule Take 1 capsule (100 mg total) by mouth 3 (three) times daily. 270 capsule 3   hydrocortisone (ANUSOL-HC) 2.5 % rectal cream Place 1 application rectally 2 (two) times daily. As needed for rectal discomfort. 30 g 1   lisinopril (ZESTRIL) 40 MG tablet Take 1 tablet (40 mg total) by mouth daily. 90 tablet 3   metoprolol succinate (TOPROL-XL) 50 MG 24 hr tablet Take 1 tablet (50 mg total) by mouth daily. Take with or immediately following a meal. 90 tablet 3   montelukast (SINGULAIR) 10 MG tablet Take 1 tablet (10 mg total) by mouth at bedtime. 90 tablet 1   Multiple Vitamin (MULTIVITAMIN) tablet Take 1 tablet by mouth daily.     pantoprazole (PROTONIX)  40 MG tablet Take 1 tablet (40 mg total) by mouth as needed. 30 minutes before breakfast 90 tablet 3   rosuvastatin (CRESTOR) 20 MG tablet Take 1 tablet (20 mg total) by mouth daily. 90 tablet 3   tizanidine (ZANAFLEX) 2 MG capsule Take 1 capsule (2 mg total) by mouth at bedtime as needed for muscle spasms. 30 capsule 1   triamcinolone (KENALOG) 0.025 % ointment Apply 1 application topically 2 (two) times daily. 60 g 1   No current facility-administered medications for this visit.    Allergies as of 03/11/2022 - Review Complete 03/11/2022  Allergen Reaction Noted   Nsaids Other (See Comments) 07/25/2016   Atorvastatin Other (See Comments) 11/30/2018    Family History  Problem Relation Age of Onset   Hypertension Brother    Transient ischemic attack Brother    Cancer Mother        unsure of type    Other Father        grangrene    Asthma Sister    Stroke Sister    COPD Sister    Stroke Sister    Early death Brother    Early death Brother    Liver cancer Daughter    Cancer Daughter    Hernia Son        Umbilical   GI problems Son    Pancreatic disease Daughter        pancreatectomy   Cancer Daughter    Diverticulitis Daughter    Arthritis Daughter        knee    Arthritis Daughter        shoulder    Anemia Daughter    GER disease Son    Colon cancer Neg Hx    Colon polyps Neg Hx     Social History   Socioeconomic History   Marital status: Divorced    Spouse name: Not on file   Number of children: 8   Years of education: 11   Highest education level: 11th grade  Occupational History   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   No daily caffeine use.  2 story home but she only has to use one level   Social Determinants of Health   Financial Resource Strain: Low Risk  (08/08/2021)   Overall Financial Resource Strain (CARDIA)    Difficulty of Paying Living Expenses: Not hard at all  Food Insecurity: No Food Insecurity (08/08/2021)   Hunger Vital Sign    Worried About Running Out of Food in the Last Year: Never true    Ran Out of Food in the Last Year: Never true  Transportation Needs: No Transportation Needs (08/08/2021)   PRAPARE - Hydrologist (Medical): No    Lack of Transportation (Non-Medical): No  Physical Activity: Inactive (12/17/2021)   Exercise Vital Sign    Days of Exercise per Week: 0 days    Minutes of Exercise per Session: 0 min  Stress: Stress Concern Present (12/17/2021)   [place]    Feeling of Stress : To some extent  Social Connections: Socially Isolated (08/08/2021)   Social Connection and Isolation Panel [NHANES]    Frequency of Communication with Friends and Family: More than three times a week    Frequency of Social Gatherings with Friends and Family: More than three times a week    Attends Religious Services: Never    Marine scientist or Organizations: No    Attends Archivist Meetings: Never    Marital Status: Widowed  Intimate Partner Violence: Not At Risk (08/08/2021)   Humiliation, Afraid, Rape, and Kick questionnaire    Fear of Current or Ex-Partner: No    Emotionally Abused: No    Physically Abused: No    Sexually Abused: No     Review of Systems   Gen: Denies any fever, chills, fatigue, weight loss, lack of appetite.  CV: Denies chest pain, heart palpitations, peripheral edema,  syncope.  Resp: Denies shortness of breath at rest or with exertion. Denies wheezing or cough.  GI: Denies dysphagia or odynophagia. Denies jaundice, hematemesis, fecal incontinence. GU : Denies urinary burning, urinary frequency, urinary hesitancy MS: Denies joint pain, muscle weakness, cramps, or limitation of movement.  Derm: Denies rash, itching, dry skin Psych: Denies depression, anxiety, memory loss, and confusion Heme: Denies bruising, bleeding, and enlarged lymph nodes.   Physical Exam   BP (!) 146/76   Pulse 74   Temp (!) 97.3 F (36.3 C)   Ht 5\' 1"  (1.549 m)   Wt 239 lb 3.2 oz (108.5 kg)   BMI 45.20 kg/m  General:   Alert and oriented. Pleasant and cooperative. Well-nourished and well-developed.  Head:  Normocephalic and atraumatic. Eyes:  Without icterus Abdomen:  +BS, soft, non-tender and non-distended. No HSM noted. No guarding or rebound. No masses appreciated.  Rectal:  Deferred  Msk:  Symmetrical without gross deformities. Normal posture. Extremities:  Without edema. Neurologic:  Alert and  oriented x4;  grossly normal neurologically. Skin:  Intact without significant lesions or rashes. Psych:  Alert and cooperative. Normal mood and affect.   Assessment   Shelly [name] is an 81 y.o. female presenting today in follow-up with a history of chronic GERD, diverticular bleed many years prior, previously IDA with capsule study showing enteritis due to aspirin use, and chronic normocytic anemia with chronic disease component.  She prefers frequent visits.  GERD well-controlled on pantoprazole just as needed. She has no concerning lower or upper GI signs/symptoms.  Chronic anemia at baseline without overt GI bleeding.  PLAN    Continue PPI as needed Return in 3 months, as patient desires frequent visits.    Annitta Needs, PhD, ANP-BC [facility]

## 2022-04-12 IMAGING — US US RENAL
1 series · 14 of 25 positions shown · non-contrast
Comparison: None.

CLINICAL DATA: Stage IIIB chronic kidney disease

EXAM:
RENAL / URINARY TRACT ULTRASOUND COMPLETE

[Series 1: us renal · 14 of 33 slices shown]
[im 1/33]
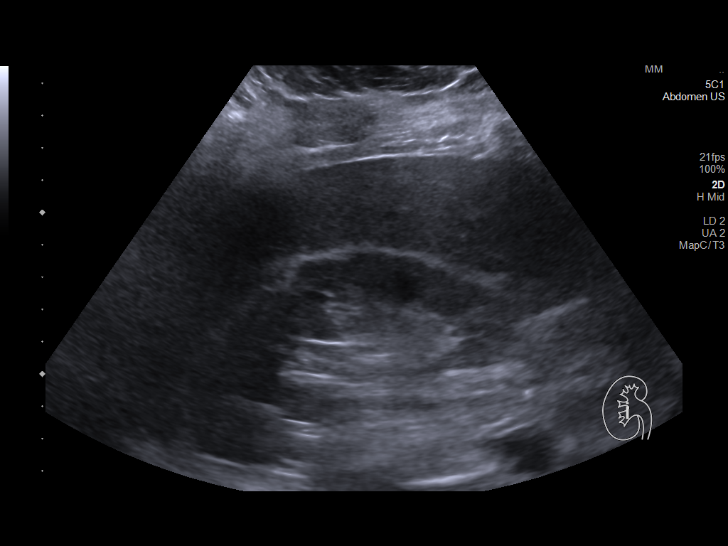
[im 3/33]
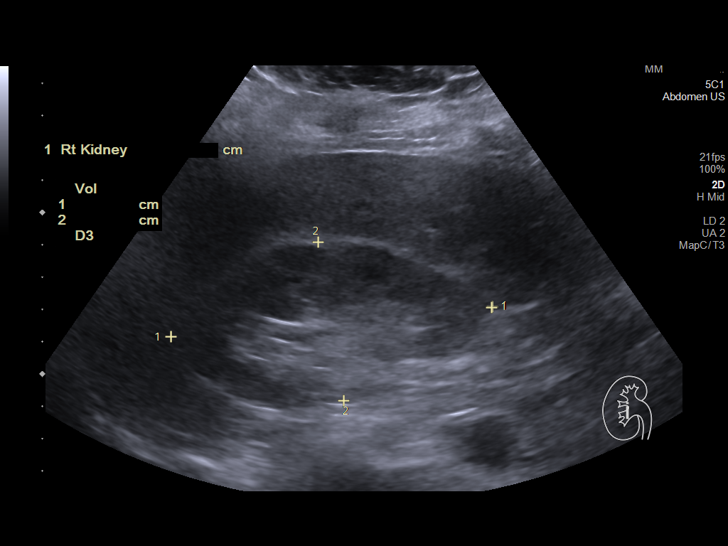
[im 6/33]
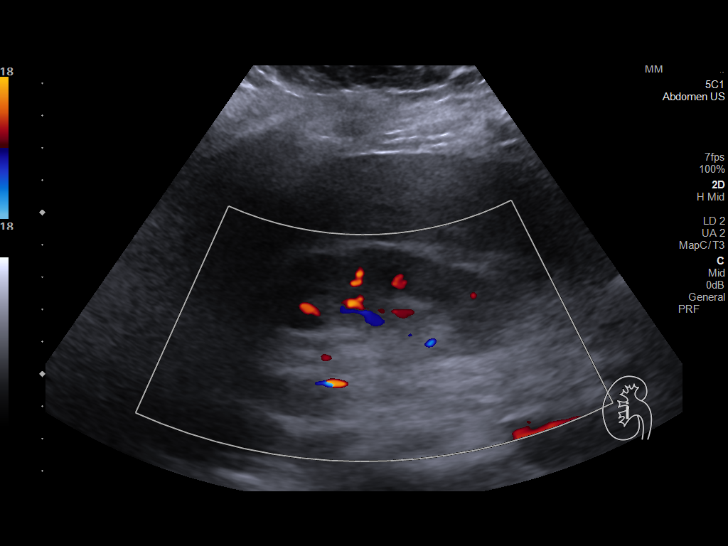
[im 9/33]
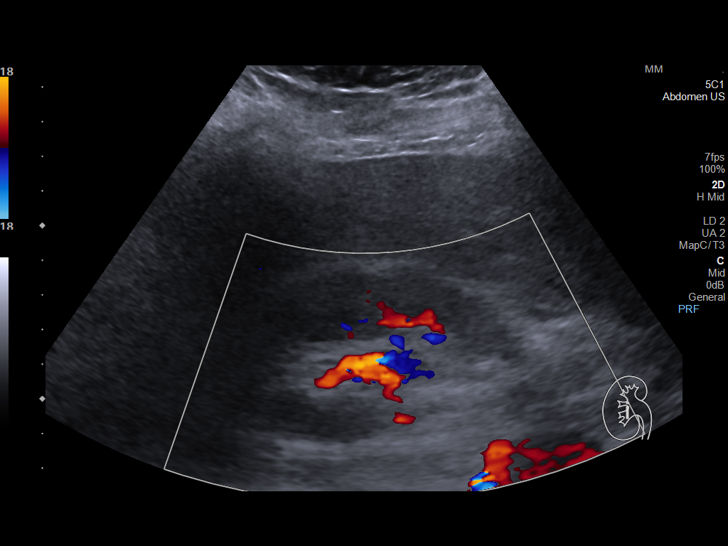
[im 11/33]
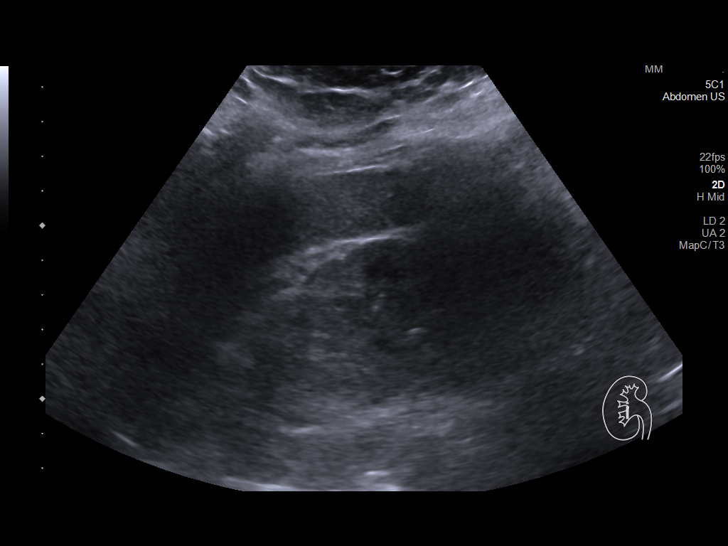
[im 13/33]
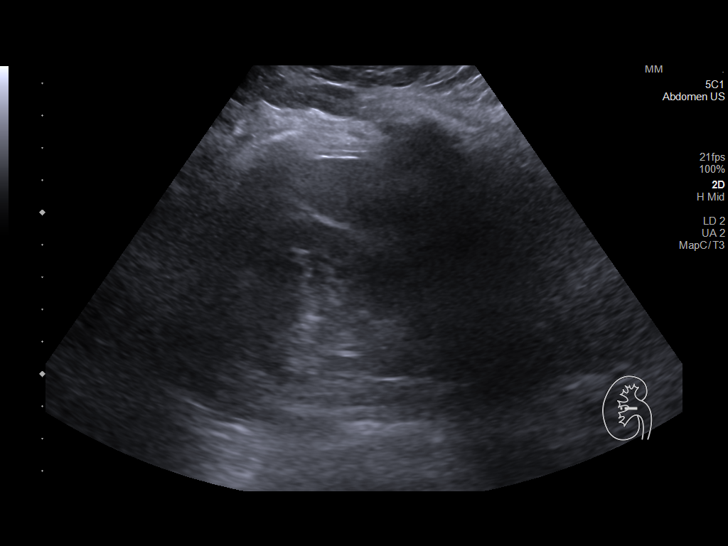
[im 15/33]
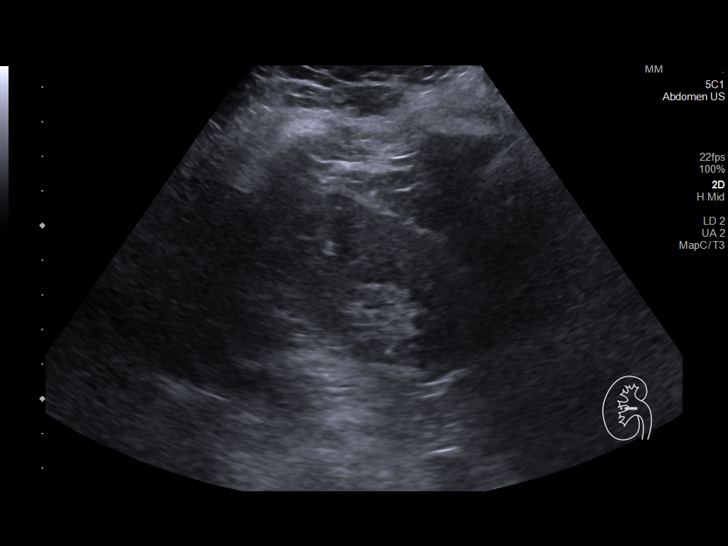
[im 18/33]
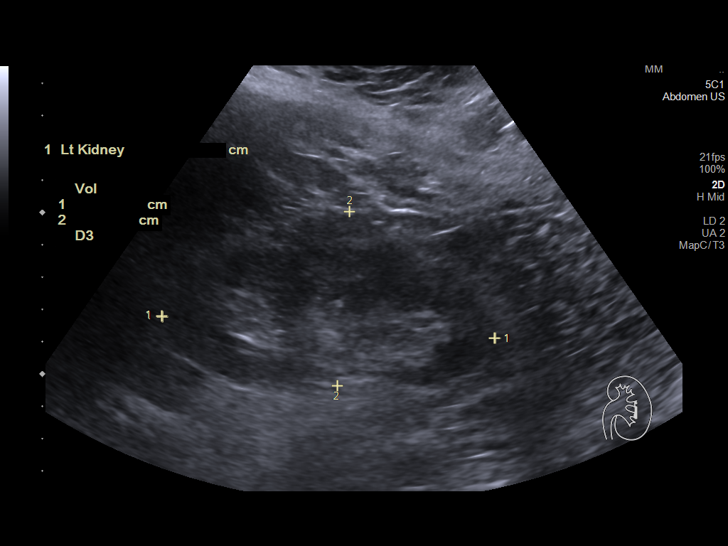
[im 21/33]
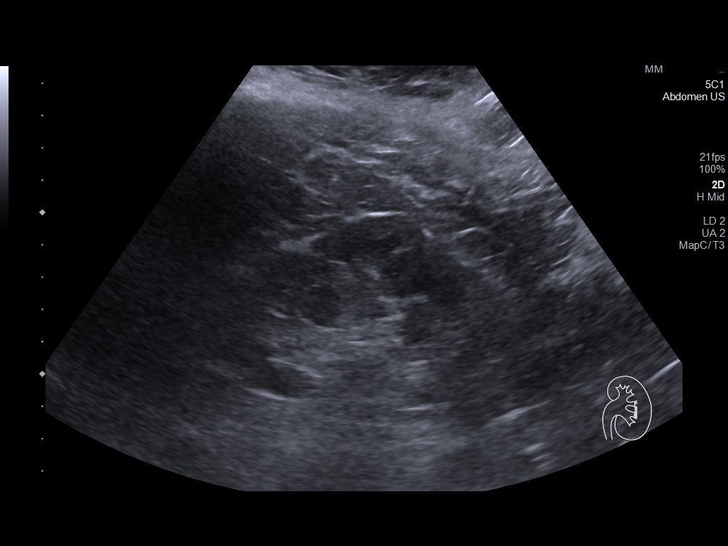
[im 22/33]
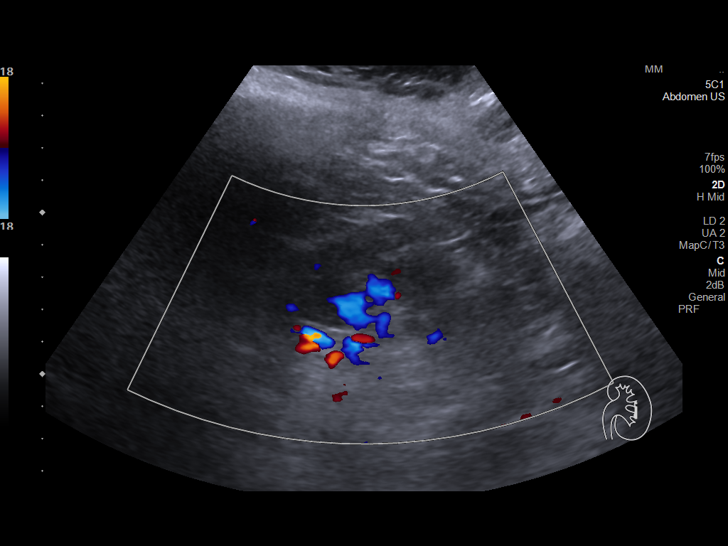
[im 25/33]
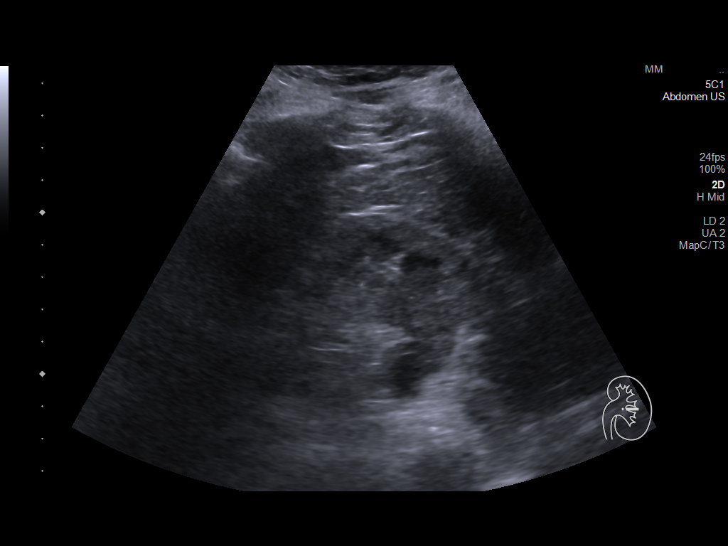
[im 27/33]
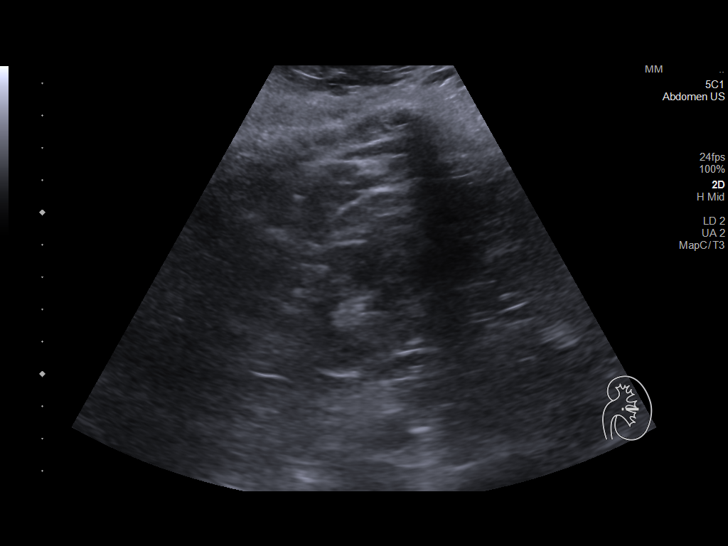
[im 30/33]
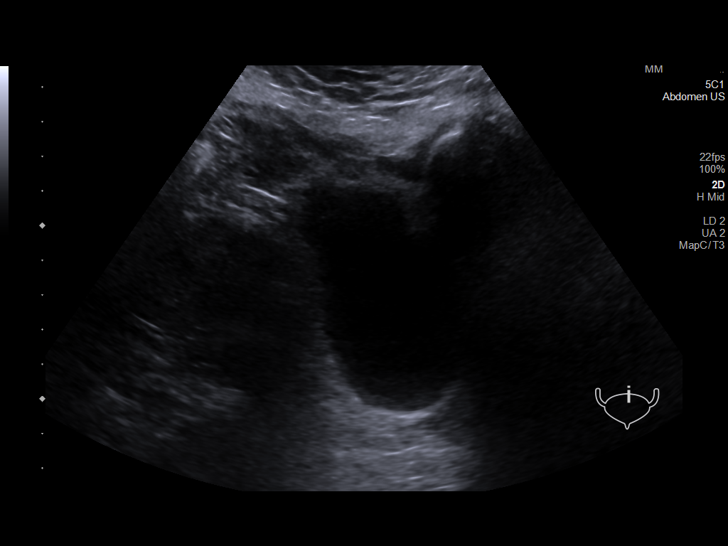
[im 33/33]
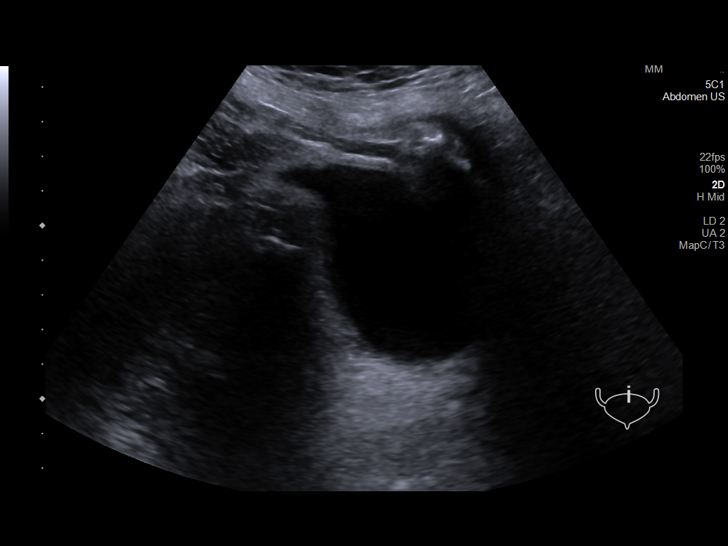

[14 of 25 positions shown; findings below may reference images not displayed]

FINDINGS: Right Kidney:

Renal measurements: 10.0 x 55.0 x 5.8 cm = volume: 151 mL.
Echogenicity within normal limits. No mass or hydronephrosis
visualized.

Left Kidney:

Renal measurements: 10.3 x 5.4 x 6.2 cm = volume: 181 mL.
Echogenicity within normal limits. No mass or hydronephrosis
visualized.

Bladder:

Appears normal for degree of bladder distention.

Other:

None.
IMPRESSION: Normal renal sonogram

## 2022-04-14 ENCOUNTER — Ambulatory Visit (INDEPENDENT_AMBULATORY_CARE_PROVIDER_SITE_OTHER): Payer: Medicare Other | Admitting: Family Medicine

## 2022-04-14 ENCOUNTER — Encounter: Payer: Self-pay | Admitting: Family Medicine

## 2022-04-14 DIAGNOSIS — J069 Acute upper respiratory infection, unspecified: Secondary | ICD-10-CM | POA: Diagnosis not present

## 2022-04-14 MED ORDER — BENZONATATE 200 MG PO CAPS: 200.0000 mg | ORAL_CAPSULE | Freq: Two times a day (BID) | ORAL | 0 refills | Status: DC | PRN

## 2022-04-14 MED ORDER — FLUTICASONE PROPIONATE 50 MCG/ACT NA SUSP: 1.0000 | Freq: Two times a day (BID) | NASAL | 6 refills | Status: DC | PRN

## 2022-04-14 NOTE — Progress Notes (Signed)
Virtual Visit via telephone Note  I connected with Shelly [name] on 04/14/22 at 1257 by telephone and verified that I am speaking with the correct person using two identifiers. Shelly [name] is currently located at home and patient are currently with her during visit. The provider, [name] Ruhani Umland, MD is located in their office at time of visit.  Call ended at 1303  I discussed the limitations, risks, security and privacy concerns of performing an evaluation and management service by telephone and the availability of in person appointments. I also discussed with the patient that there may be a patient responsible charge related to this service. The patient expressed understanding and agreed to proceed.   History and Present Illness: Patient is calling in for a bad cough and somewhat productive.  This started about 3 days.  She was taking mucinex and it helps some.  She is coughing a lot at night because of it. She denies fevers or chills.  She has  a [place] that had cold this week. She says a little wheezing but no SOB.  She has nasal congestion.   1. Viral upper respiratory infection     Outpatient Encounter Medications as of 04/14/2022  Medication Sig   benzonatate (TESSALON) 200 MG capsule Take 1 capsule (200 mg total) by mouth 2 (two) times daily as needed for cough.   fluticasone (FLONASE) 50 MCG/ACT nasal spray Place 1 spray into both nostrils 2 (two) times daily as needed for allergies or rhinitis.   amLODipine (NORVASC) 5 MG tablet TAKE ONE TABLET BY MOUTH ONCE DAILY   betamethasone valerate (VALISONE) 0.1 % cream Apply 1 application. topically 2 (two) times daily. prn   cetirizine (ZYRTEC) 10 MG tablet Take 10 mg by mouth daily.   chlorthalidone (HYGROTON) 25 MG tablet Take 1 tablet (25 mg total) by mouth daily.   clopidogrel (PLAVIX) 75 MG tablet TAKE ONE TABLET BY MOUTH DAILY   diclofenac Sodium (VOLTAREN) 1 % GEL Apply 2 g topically 4 (four) times daily.    furosemide (LASIX) 40 MG tablet Take 40 mg by mouth daily as needed (weight gain).   gabapentin (NEURONTIN) 100 MG capsule Take 1 capsule (100 mg total) by mouth 3 (three) times daily.   hydrocortisone (ANUSOL-HC) 2.5 % rectal cream Place 1 Application rectally 2 (two) times daily. As needed for rectal discomfort.   lisinopril (ZESTRIL) 40 MG tablet Take 1 tablet (40 mg total) by mouth daily.   metoprolol succinate (TOPROL-XL) 50 MG 24 hr tablet Take 1 tablet (50 mg total) by mouth daily. Take with or immediately following a meal.   montelukast (SINGULAIR) 10 MG tablet Take 1 tablet (10 mg total) by mouth at bedtime.   Multiple Vitamin (MULTIVITAMIN) tablet Take 1 tablet by mouth daily.   pantoprazole (PROTONIX) 40 MG tablet Take 1 tablet (40 mg total) by mouth as needed. 30 minutes before breakfast   rosuvastatin (CRESTOR) 20 MG tablet Take 1 tablet (20 mg total) by mouth daily.   tizanidine (ZANAFLEX) 2 MG capsule Take 1 capsule (2 mg total) by mouth at bedtime as needed for muscle spasms.   triamcinolone (KENALOG) 0.025 % ointment Apply 1 application topically 2 (two) times daily.   No facility-administered encounter medications on file as of 04/14/2022.    Review of Systems  Constitutional:  Negative for chills and fever.  HENT:  Positive for congestion, postnasal drip, rhinorrhea, sinus pressure and sneezing. Negative for ear discharge, ear pain and sore throat.  Eyes:  Negative for pain, redness and visual disturbance.  Respiratory:  Positive for cough. Negative for chest tightness and shortness of breath.   Cardiovascular:  Negative for chest pain and leg swelling.  Genitourinary:  Negative for difficulty urinating and dysuria.  Musculoskeletal:  Negative for back pain and gait problem.  Skin:  Negative for rash.  Neurological:  Negative for light-headedness and headaches.  Psychiatric/Behavioral:  Negative for agitation and behavioral problems.   All other systems reviewed and are  negative.   Observations/Objective: Patient sounds comfortable and in no acute distress  Assessment and Plan: Problem List Items Addressed This Visit   None Visit Diagnoses     Viral upper respiratory infection    -  Primary   Relevant Medications   benzonatate (TESSALON) 200 MG capsule   fluticasone (FLONASE) 50 MCG/ACT nasal spray       Flonase  Mucinex Tessalon perles  Likely viral syndrome treat conservatively Follow up plan: Return if symptoms worsen or fail to improve.     I discussed the assessment and treatment plan with the patient. The patient was provided an opportunity to ask questions and all were answered. The patient agreed with the plan and demonstrated an understanding of the instructions.   The patient was advised to call back or seek an in-person evaluation if the symptoms worsen or if the condition fails to improve as anticipated.  The above assessment and management plan was discussed with the patient. The patient verbalized understanding of and has agreed to the management plan. Patient is aware to call the clinic if symptoms persist or worsen. Patient is aware when to return to the clinic for a follow-up visit. Patient educated on when it is appropriate to go to the emergency department.    I provided 6 minutes of non-face-to-face time during this encounter.    [name], MD

## 2022-05-06 ENCOUNTER — Other Ambulatory Visit: Payer: Self-pay | Admitting: *Deleted

## 2022-05-06 ENCOUNTER — Other Ambulatory Visit: Payer: Self-pay | Admitting: Cardiology

## 2022-05-06 MED ORDER — ROSUVASTATIN CALCIUM 20 MG PO TABS: 20.0000 mg | ORAL_TABLET | Freq: Every day | ORAL | 3 refills | Status: DC

## 2022-05-07 ENCOUNTER — Other Ambulatory Visit: Payer: Self-pay | Admitting: Family Medicine

## 2022-05-21 DIAGNOSIS — E0590 Thyrotoxicosis, unspecified without thyrotoxic crisis or storm: Secondary | ICD-10-CM | POA: Diagnosis not present

## 2022-05-21 DIAGNOSIS — I129 Hypertensive chronic kidney disease with stage 1 through stage 4 chronic kidney disease, or unspecified chronic kidney disease: Secondary | ICD-10-CM | POA: Diagnosis not present

## 2022-05-21 DIAGNOSIS — I5032 Chronic diastolic (congestive) heart failure: Secondary | ICD-10-CM | POA: Diagnosis not present

## 2022-05-21 DIAGNOSIS — N1832 Chronic kidney disease, stage 3b: Secondary | ICD-10-CM | POA: Diagnosis not present

## 2022-05-21 DIAGNOSIS — R809 Proteinuria, unspecified: Secondary | ICD-10-CM | POA: Diagnosis not present

## 2022-05-21 DIAGNOSIS — D638 Anemia in other chronic diseases classified elsewhere: Secondary | ICD-10-CM | POA: Diagnosis not present

## 2022-05-22 ENCOUNTER — Ambulatory Visit: Payer: Medicare Other | Admitting: Nurse Practitioner

## 2022-05-22 ENCOUNTER — Encounter: Payer: Self-pay | Admitting: Nurse Practitioner

## 2022-05-22 VITALS — BP 132/70 | HR 66 | Ht 61.0 in | Wt 236.0 lb

## 2022-05-22 DIAGNOSIS — E0590 Thyrotoxicosis, unspecified without thyrotoxic crisis or storm: Secondary | ICD-10-CM | POA: Diagnosis not present

## 2022-05-22 LAB — T4, FREE: Free T4: 0.98 ng/dL (ref 0.82–1.77)

## 2022-05-22 LAB — T3, FREE: T3, Free: 2.8 pg/mL (ref 2.0–4.4)

## 2022-05-22 LAB — TSH: TSH: 0.175 u[IU]/mL — ABNORMAL LOW (ref 0.450–4.500)

## 2022-05-22 NOTE — Progress Notes (Signed)
05/22/2022, 3:25 PM                                     Endocrinology follow-up note    Subjective:    Patient ID: Shelly [name], female    DOB: 01-30-1941, PCP [name], [name], MD   Past Medical History:  Diagnosis Date   Allergy    Anemia    GI bleed   Anginal pain ([place])    Anxiety    Arthritis    Blood transfusion without reported diagnosis    CAD (coronary artery disease)    a. s/p NSTEMI in 2015 with DES x2 to RCA   Cataract    Chronic kidney disease    Coronary artery disease    SEVERE   GERD (gastroesophageal reflux disease)    GI bleed 12/2013   Goiter    Hyperlipidemia    Hypertension    Myocardial infarct [facility])    Myocardial infarction ([place]) 08/2013   Neuralgia of right lower extremity 07/13/2017   Pancolonic diverticulosis 12/2013   Post herpetic neuralgia    Past Surgical History:  Procedure Laterality Date   CARPAL TUNNEL RELEASE Bilateral    COLONOSCOPY N/A 01/13/2014   Dr. Hung:pandiverticulosis    COLONOSCOPY N/A 12/14/2015   Dr. Michail Sermon: pancolonic diverticulosis, internal hemorrhoids    CORNEAL TRANSPLANT     CORONARY ANGIOPLASTY  08/2013   ESOPHAGOGASTRODUODENOSCOPY N/A 12/14/2015   Dr. Michail Sermon: normal    GIVENS CAPSULE STUDY N/A 08/13/2016   Dr. Oneida Alar: enteritis due to aspirin.    HEEL SPUR EXCISION     LEFT HEART CATHETERIZATION WITH CORONARY ANGIOGRAM N/A 08/23/2013   Procedure: LEFT HEART CATHETERIZATION WITH CORONARY ANGIOGRAM;  Surgeon: Peter M Martinique, MD;  Location: [facility] CATH LAB;  Service: Cardiovascular;  Laterality: N/A;   PERCUTANEOUS CORONARY STENT INTERVENTION (PCI-S) N/A 08/24/2013   Procedure: PERCUTANEOUS CORONARY STENT INTERVENTION (PCI-S);  Surgeon: Peter M Martinique, MD;  Location: [facility] CATH LAB;  Service: Cardiovascular;  Laterality: N/A;   TONSILLECTOMY     TUBAL LIGATION     Social History   Socioeconomic History   Marital status: Divorced    Spouse name: Not on file    Number of children: 8   Years of education: 11   Highest education level: 11th grade  Occupational History   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   No daily caffeine use.   2 story home but she only has to use one level   Social Determinants of Radio broadcast assistant Strain: Low Risk  (  08/08/2021)   Overall Financial Resource Strain (CARDIA)    Difficulty of Paying Living Expenses: Not hard at all  Food Insecurity: No Food Insecurity (08/08/2021)   Hunger Vital Sign    Worried About Running Out of Food in the Last Year: Never true    Ran Out of Food in the Last Year: Never true  Transportation Needs: No Transportation Needs (08/08/2021)   PRAPARE - Hydrologist (Medical): No    Lack of Transportation (Non-Medical): No  Physical Activity: Inactive (12/17/2021)   Exercise Vital Sign    Days of Exercise per Week: 0 days    Minutes of Exercise per Session: 0 min  Stress: Stress Concern Present (12/17/2021)   [place]    Feeling of Stress : To some extent  Social Connections: Socially Isolated (08/08/2021)   Social Connection and Isolation Panel [NHANES]    Frequency of Communication with Friends and Family: More than three times a week    Frequency of Social Gatherings with Friends and Family: More than three times a week    Attends Religious Services: Never    Building surveyor or Organizations: No    Attends Archivist Meetings: Never    Marital Status: Widowed   Family History  Problem Relation Age of Onset   Hypertension Brother    Transient ischemic attack Brother    Cancer Mother        unsure of type    Other Father        grangrene    Asthma Sister    Stroke Sister    COPD Sister    Stroke Sister    Early death Brother    Early death Brother    Liver cancer Daughter    Cancer Daughter    Hernia Son        Umbilical   GI problems Son    Pancreatic disease Daughter        pancreatectomy   Cancer Daughter    Diverticulitis Daughter    Arthritis Daughter        knee    Arthritis Daughter        shoulder    Anemia Daughter    GER disease Son    Colon cancer Neg Hx    Colon polyps Neg Hx    Outpatient Encounter Medications as of 05/22/2022  Medication Sig   amLODipine (NORVASC) 5 MG tablet TAKE ONE TABLET BY MOUTH ONCE DAILY   benzonatate (TESSALON) 200 MG capsule Take 1 capsule (200 mg total) by mouth 2 (two) times daily as needed for cough.   betamethasone valerate (VALISONE) 0.1 % cream Apply 1 application. topically 2 (two) times daily. prn   cetirizine (ZYRTEC) 10 MG tablet Take 10 mg by mouth daily.   chlorthalidone (HYGROTON) 25 MG tablet Take 1 tablet (25 mg total) by mouth daily.   clopidogrel (PLAVIX) 75 MG tablet Take 1 tablet (75 mg total) by mouth daily. (NEEDS TO BE SEEN BEFORE NEXT REFILL)   diclofenac Sodium (VOLTAREN) 1 % GEL Apply 2 g topically 4 (four) times daily.   furosemide (LASIX) 40 MG tablet Take 40 mg by mouth daily as needed (weight gain).   gabapentin (NEURONTIN) 100 MG capsule Take 1 capsule (100 mg total) by mouth 3 (three) times daily.   hydrocortisone (ANUSOL-HC) 2.5 % rectal cream Place 1 Application rectally 2 (two) times daily. As needed for rectal discomfort.  lisinopril (ZESTRIL) 40 MG tablet Take 1 tablet (40 mg total) by mouth daily.   metoprolol succinate (TOPROL-XL) 50 MG 24  hr tablet Take 1 tablet (50 mg total) by mouth daily. Take with or immediately following a meal.   montelukast (SINGULAIR) 10 MG tablet Take 1 tablet (10 mg total) by mouth at bedtime.   Multiple Vitamin (MULTIVITAMIN) tablet Take 1 tablet by mouth daily.   pantoprazole (PROTONIX) 40 MG tablet Take 1 tablet (40 mg total) by mouth as needed. 30 minutes before breakfast   rosuvastatin (CRESTOR) 20 MG tablet Take 1 tablet (20 mg total) by mouth daily.   tizanidine (ZANAFLEX) 2 MG capsule Take 1 capsule (2 mg total) by mouth at bedtime as needed for muscle spasms.   triamcinolone (KENALOG) 0.025 % ointment Apply 1 application topically 2 (two) times daily.   fluticasone (FLONASE) 50 MCG/ACT nasal spray Place 1 spray into both nostrils 2 (two) times daily as needed for allergies or rhinitis. (Patient not taking: Reported on 05/22/2022)   No facility-administered encounter medications on file as of 05/22/2022.   ALLERGIES: Allergies  Allergen Reactions   Nsaids Other (See Comments)    AVOID all NSAID due to history of GI bleeding   Atorvastatin Other (See Comments)    Joint & muscle pain    VACCINATION STATUS: Immunization History  Administered Date(s) Administered   Pneumococcal Conjugate-13 10/02/2016   Pneumococcal Polysaccharide-23 08/25/2013   Zoster Recombinat (Shingrix) 03/17/2018    Thyroid Problem Presents for follow-up visit. Patient reports no anxiety, cold intolerance, constipation, depressed mood, fatigue, heat intolerance, leg swelling, palpitations, tremors, weight gain or weight loss. The symptoms have been stable.    Shelly [name] is 82 y.o. female who is seen in follow-up after she was seen in consultation for subclinical hyperthyroidism.    PMD: [name], [name], MD.  Due to septic symptoms of thyrotoxicosis, she was started on low-dose methimazole, which was since tapered down.  She has been off the Methimazole for some time now with stable thyroid  response.  She denies any history of goiter, no family history of thyroid dysfunction or thyroid malignancy.  Her thyroid uptake and scan on June 14, 2019 showed 24-hour uptake of 10.3% which is normal.  There was asymmetric thyroid activity with generally increased uptake in the right lobe.  No focal hot or cold nodules identified.  Her medical history includes hypertension and hyperlipidemia on treatment.  Review of systems  Constitutional: + Minimally fluctuating body weight,  current Body mass index is 44.59 kg/m. , no fatigue, no subjective hyperthermia, no subjective hypothermia Eyes: no blurry vision, no xerophthalmia ENT: no sore throat, no nodules palpated in throat, no dysphagia/odynophagia, no hoarseness Cardiovascular: no chest pain, no shortness of breath, no palpitations, no leg swelling Respiratory: no cough, no shortness of breath Gastrointestinal: no nausea/vomiting/diarrhea Musculoskeletal: no muscle/joint aches, walks with rollator Skin: no rashes, no hyperemia Neurological: no tremors, no numbness, no tingling, no dizziness Psychiatric: no depression, no anxiety  Objective:    BP 132/70 (BP Location: Right Arm, Patient Position: Sitting, Cuff Size: Large)   Pulse 66   Ht 5\' 1"  (1.549 m)   Wt 236 lb (107 kg)   BMI 44.59 kg/m   Wt Readings from Last 3 Encounters:  05/22/22 236 lb (107 kg)  03/11/22 239 lb 3.2 oz (108.5 kg)  02/19/22 238 lb 12.8 oz (108.3 kg)    BP Readings from Last 3 Encounters:  05/22/22 132/70  03/11/22 (!) 146/76  02/19/22 124/63     Physical Exam- Limited  Constitutional:  Body mass index is 44.59 kg/m. , not in acute distress, normal state of mind Eyes:  EOMI, no exophthalmos Neck: Supple Musculoskeletal: no gross deformities, strength intact in all four extremities, no gross restriction of joint movements, walks with rollator Skin:  no rashes, no hyperemia Neurological: no tremor with outstretched hands    CMP      Component Value Date/Time   NA 143 01/31/2022 1557   K 5.2 01/31/2022 1557   CL 110 (H) 01/31/2022 1557   CO2 21 01/31/2022 1557   GLUCOSE 112 (H) 01/31/2022 1557   GLUCOSE 114 (H) 04/05/2021 1319   BUN 17 01/31/2022 1557   CREATININE 1.65 (H) 01/31/2022 1557   CREATININE 1.31 (H) 12/21/2018 1547   CALCIUM 10.0 01/31/2022 1557   PROT 6.8 01/31/2022 1557   ALBUMIN 3.7 (L) 01/31/2022 1557   AST 12 01/31/2022 1557   ALT 6 01/31/2022 1557   ALKPHOS 86 01/31/2022 1557   BILITOT 0.2 01/31/2022 1557   GFRNONAA 32 (L) 04/05/2021 1319   GFRAA 34 (L) 04/30/2020 1510     Diabetic Labs (most recent): Lab Results  Component Value Date   HGBA1C 5.9 (H) 01/31/2022   HGBA1C 6.0 (H) 12/09/2015   HGBA1C 6.0 (H) 08/23/2013   MICROALBUR 18.6 (H) 04/05/2021     Lipid Panel ( most recent) Lipid Panel     Component Value Date/Time   CHOL 155 01/31/2022 1557   TRIG 99 01/31/2022 1557   HDL 46 01/31/2022 1557   CHOLHDL 3.4 01/31/2022 1557   CHOLHDL 2.6 11/20/2016 0854   VLDL 11 11/20/2016 0854   LDLCALC 91 01/31/2022 1557   LABVLDL 18 01/31/2022 1557      Lab Results  Component Value Date   TSH 0.175 (L) 05/21/2022   TSH 0.134 (L) 02/03/2022   TSH 0.287 (L) 01/31/2022   TSH 0.057 (L) 10/30/2021   TSH 0.062 (L) 10/17/2021   TSH 0.195 (L) 07/24/2021   TSH 0.277 (L) 06/27/2021   TSH 0.224 (L) 04/02/2021   TSH 0.120 (L) 02/08/2021   TSH 0.161 (L) 10/16/2020   FREET4 0.98 05/21/2022   FREET4 1.11 02/03/2022   FREET4 1.11 10/17/2021   FREET4 0.96 07/24/2021   FREET4 0.88 04/02/2021   FREET4 1.02 10/16/2020   FREET4 1.00 07/04/2020   FREET4 0.97 05/03/2020   FREET4 1.0 01/02/2020   FREET4 0.9 09/23/2019     Latest Reference Range & Units 10/17/21 13:49 10/30/21 15:36 01/31/22 15:57 02/03/22 15:13 05/21/22 14:50  TSH 0.450 - 4.500 uIU/mL 0.062 (L) 0.057 (L) 0.287 (L) 0.134 (L) 0.175 (L)  Triiodothyronine,Free,Serum 2.0 - 4.4 pg/mL 2.6   3.1 2.8  T4,Free(Direct) 0.82 - 1.77  ng/dL 1.11   1.11 0.98  Thyroxine (T4) 4.5 - 12.0 ug/dL  6.1 6.1    Free Thyroxine Index 1.2 - 4.9   1.7 1.4    T3 Uptake Ratio 24 - 39 %  28 23 (L)    (L): Data is abnormally low  Assessment & Plan:   1. Subclinical hyperthyroidism  -Her most recent thyroid function tests are once again stable, TSH still minimally suppressed but FT3 and FT4 are normal.  There is no need for antithyroid treatment at this time.   Will monitor TFTs again in 3 months (patient prefers to have more frequent checkups than the 6 month I offered her).    - she is advised to maintain close follow up with [name], Vonna Kotyk  A, MD for primary care needs.    I spent 20 minutes in the care of the patient today including review of labs from Thyroid Function, CMP, and other relevant labs ; imaging/biopsy records (current and previous including abstractions from other facilities); face-to-face time discussing  her lab results and symptoms, medications doses, her options of short and long term treatment based on the latest standards of care / guidelines;   and documenting the encounter.  Shelly [name]  participated in the discussions, expressed understanding, and voiced agreement with the above plans.  All questions were answered to her satisfaction. she is encouraged to contact clinic should she have any questions or concerns prior to her return visit.   Follow up plan: Return in about 3 months (around 08/21/2022) for Thyroid follow up, Previsit labs.   Rayetta Pigg, [facility] [address] [place],  [address] Phone: [phone] Fax: [phone]   05/22/2022, 3:25 PM

## 2022-05-23 ENCOUNTER — Other Ambulatory Visit: Payer: Self-pay | Admitting: Student

## 2022-05-28 ENCOUNTER — Other Ambulatory Visit: Payer: Self-pay | Admitting: Family Medicine

## 2022-05-30 DIAGNOSIS — I129 Hypertensive chronic kidney disease with stage 1 through stage 4 chronic kidney disease, or unspecified chronic kidney disease: Secondary | ICD-10-CM | POA: Diagnosis not present

## 2022-05-30 DIAGNOSIS — R809 Proteinuria, unspecified: Secondary | ICD-10-CM | POA: Diagnosis not present

## 2022-05-30 DIAGNOSIS — I5032 Chronic diastolic (congestive) heart failure: Secondary | ICD-10-CM | POA: Diagnosis not present

## 2022-05-30 DIAGNOSIS — D638 Anemia in other chronic diseases classified elsewhere: Secondary | ICD-10-CM | POA: Diagnosis not present

## 2022-05-30 DIAGNOSIS — N1832 Chronic kidney disease, stage 3b: Secondary | ICD-10-CM | POA: Diagnosis not present

## 2022-06-11 ENCOUNTER — Ambulatory Visit: Payer: Medicare Other | Admitting: Gastroenterology

## 2022-06-12 ENCOUNTER — Ambulatory Visit: Payer: Medicare Other | Admitting: Family Medicine

## 2022-06-26 ENCOUNTER — Encounter: Payer: Self-pay | Admitting: Gastroenterology

## 2022-06-26 ENCOUNTER — Ambulatory Visit: Payer: Medicare Other | Admitting: Gastroenterology

## 2022-06-26 VITALS — BP 132/71 | HR 75 | Temp 97.3°F | Ht 61.5 in | Wt 232.0 lb

## 2022-06-26 DIAGNOSIS — K219 Gastro-esophageal reflux disease without esophagitis: Secondary | ICD-10-CM | POA: Diagnosis not present

## 2022-06-26 MED ORDER — HYDROCORTISONE (PERIANAL) 2.5 % EX CREA: 1.0000 | TOPICAL_CREAM | Freq: Two times a day (BID) | CUTANEOUS | 1 refills | Status: DC

## 2022-06-26 NOTE — Progress Notes (Signed)
Gastroenterology Office Note     Primary Care Physician:  [name], [name], MD  Primary Gastroenterologist: Dr. Gala Romney    Chief Complaint   Chief Complaint  Patient presents with   Follow-up    Follow up on GERD     History of Present Illness   Shelly [name] is an 82 y.o. female presenting today in follow-up with a history of chronic GERD, diverticular bleed many years prior, IDA with capsule study showing enteritis due to aspirin use, and chronic normocytic anemia with chronic disease component.  She prefers frequent visits.   GERD controlled by taking PPI just as needed. Maybe twice a month. No abdomial pain. No other GI concerns today. She likes to be seen every 3 months.      Past Medical History:  Diagnosis Date   Allergy    Anemia    GI bleed   Anginal pain ([place])    Anxiety    Arthritis    Blood transfusion without reported diagnosis    CAD (coronary artery disease)    a. s/p NSTEMI in 2015 with DES x2 to RCA   Cataract    Chronic kidney disease    Coronary artery disease    SEVERE   GERD (gastroesophageal reflux disease)    GI bleed 12/2013   Goiter    Hyperlipidemia    Hypertension    Myocardial infarct [facility])    Myocardial infarction ([place]) 08/2013   Neuralgia of right lower extremity 07/13/2017   Pancolonic diverticulosis 12/2013   Post herpetic neuralgia     Past Surgical History:  Procedure Laterality Date   CARPAL TUNNEL RELEASE Bilateral    COLONOSCOPY N/A 01/13/2014   Dr. Hung:pandiverticulosis    COLONOSCOPY N/A 12/14/2015   Dr. Michail Sermon: pancolonic diverticulosis, internal hemorrhoids    CORNEAL TRANSPLANT     CORONARY ANGIOPLASTY  08/2013   ESOPHAGOGASTRODUODENOSCOPY N/A 12/14/2015   Dr. Michail Sermon: normal    GIVENS CAPSULE STUDY N/A 08/13/2016   Dr. Oneida Alar: enteritis due to aspirin.    HEEL SPUR EXCISION     LEFT HEART CATHETERIZATION WITH CORONARY ANGIOGRAM N/A 08/23/2013   Procedure: LEFT HEART CATHETERIZATION WITH CORONARY  ANGIOGRAM;  Surgeon: Peter M Martinique, MD;  Location: [facility] CATH LAB;  Service: Cardiovascular;  Laterality: N/A;   PERCUTANEOUS CORONARY STENT INTERVENTION (PCI-S) N/A 08/24/2013   Procedure: PERCUTANEOUS CORONARY STENT INTERVENTION (PCI-S);  Surgeon: Peter M Martinique, MD;  Location: [facility] CATH LAB;  Service: Cardiovascular;  Laterality: N/A;   TONSILLECTOMY     TUBAL LIGATION      Current Outpatient Medications  Medication Sig Dispense Refill   amLODipine (NORVASC) 5 MG tablet TAKE ONE TABLET BY MOUTH ONCE DAILY 90 tablet 3   cetirizine (ZYRTEC) 10 MG tablet Take 10 mg by mouth daily.     chlorthalidone (HYGROTON) 25 MG tablet Take 1 tablet (25 mg total) by mouth daily. 90 tablet 3   clopidogrel (PLAVIX) 75 MG tablet TAKE ONE TABLET BY MOUTH ONCE DAILY (NEEDS TO BE SEEN BEFORE NEXT REFILL) 30 tablet 0   diclofenac Sodium (VOLTAREN) 1 % GEL Apply 2 g topically 4 (four) times daily. 350 g 3   furosemide (LASIX) 40 MG tablet Take 40 mg by mouth daily as needed (weight gain).     gabapentin (NEURONTIN) 100 MG capsule Take 1 capsule (100 mg total) by mouth 3 (three) times daily. 270 capsule 3   hydrocortisone (ANUSOL-HC) 2.5 % rectal cream Place 1 Application rectally 2 (two) times daily.  As needed for rectal discomfort. 30 g 1   lisinopril (ZESTRIL) 40 MG tablet Take 1 tablet (40 mg total) by mouth daily. 90 tablet 3   metoprolol succinate (TOPROL-XL) 50 MG 24 hr tablet TAKE ONE TABLET BY MOUTH EVERY DAY IMMEDIATELY FOLLOWING A MEAL DAILY 90 tablet 3   montelukast (SINGULAIR) 10 MG tablet Take 1 tablet (10 mg total) by mouth at bedtime. 90 tablet 1   Multiple Vitamin (MULTIVITAMIN) tablet Take 1 tablet by mouth daily.     rosuvastatin (CRESTOR) 20 MG tablet Take 1 tablet (20 mg total) by mouth daily. 90 tablet 3   tizanidine (ZANAFLEX) 2 MG capsule Take 1 capsule (2 mg total) by mouth at bedtime as needed for muscle spasms. 30 capsule 1   triamcinolone (KENALOG) 0.025 % ointment Apply 1 application  topically 2 (two) times daily. 60 g 1   benzonatate (TESSALON) 200 MG capsule Take 1 capsule (200 mg total) by mouth 2 (two) times daily as needed for cough. (Patient not taking: Reported on 06/26/2022) 60 capsule 0   betamethasone valerate (VALISONE) 0.1 % cream Apply 1 application. topically 2 (two) times daily. prn (Patient not taking: Reported on 06/26/2022) 30 g 1   fluticasone (FLONASE) 50 MCG/ACT nasal spray Place 1 spray into both nostrils 2 (two) times daily as needed for allergies or rhinitis. (Patient not taking: Reported on 05/22/2022) 16 g 6   pantoprazole (PROTONIX) 40 MG tablet Take 1 tablet (40 mg total) by mouth as needed. 30 minutes before breakfast 90 tablet 3   No current facility-administered medications for this visit.    Allergies as of 06/26/2022 - Review Complete 06/26/2022  Allergen Reaction Noted   Nsaids Other (See Comments) 07/25/2016   Atorvastatin Other (See Comments) 11/30/2018    Family History  Problem Relation Age of Onset   Hypertension Brother    Transient ischemic attack Brother    Cancer Mother        unsure of type    Other Father        grangrene    Asthma Sister    Stroke Sister    COPD Sister    Stroke Sister    Early death Brother    Early death Brother    Liver cancer Daughter    Cancer Daughter    Hernia Son        Umbilical   GI problems Son    Pancreatic disease Daughter        pancreatectomy   Cancer Daughter    Diverticulitis Daughter    Arthritis Daughter        knee    Arthritis Daughter        shoulder    Anemia Daughter    GER disease Son    Colon cancer Neg Hx    Colon polyps Neg Hx     Social History   Socioeconomic History   Marital status: Divorced    Spouse name: Not on file   Number of children: 8   Years of education: 11   Highest education level: 11th grade  Occupational History   Occupation: Social worker (retired)    Comment: group home  Tobacco Use   Smoking status: Never   Smokeless tobacco:  Never  Vaping Use   Vaping Use: Never used  Substance and Sexual Activity   Alcohol use: No    Alcohol/week: 0.0 standard drinks of alcohol   Drug use: No   Sexual activity: Not Currently  Birth control/protection: Surgical    Comment: divorced, lives with daughter and granddaughters  Other Topics Concern   Not on file  Social History Narrative   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   No daily caffeine use.   2 story home but she only has to use one level   Social Determinants of Health   Financial Resource Strain: Low Risk  (08/08/2021)   Overall Financial Resource Strain (CARDIA)    Difficulty of Paying Living Expenses: Not hard at all  Food Insecurity: No Food Insecurity (08/08/2021)   Hunger Vital Sign    Worried About Running Out of Food in the Last Year: Never true    Ran Out of Food in the Last Year: Never true  Transportation Needs: No Transportation Needs (08/08/2021)   PRAPARE - Hydrologist (Medical): No    Lack of Transportation (Non-Medical): No  Physical Activity: Inactive (12/17/2021)   Exercise Vital Sign    Days of Exercise per Week: 0 days    Minutes of Exercise per Session: 0 min  Stress: Stress Concern Present (12/17/2021)   [place]    Feeling of Stress : To some extent  Social Connections: Socially Isolated (08/08/2021)   Social Connection and Isolation Panel [NHANES]    Frequency of Communication with Friends and Family: More than three times a week    Frequency of Social Gatherings with Friends and Family: More than three times a week    Attends Religious Services: Never    Marine scientist or Organizations: No    Attends Archivist Meetings: Never    Marital Status: Widowed  Intimate Partner Violence: Not At Risk (08/08/2021)   Humiliation, Afraid, Rape, and Kick questionnaire    Fear of Current  or Ex-Partner: No    Emotionally Abused: No    Physically Abused: No    Sexually Abused: No     Review of Systems   Gen: Denies any fever, chills, fatigue, weight loss, lack of appetite.  CV: Denies chest pain, heart palpitations, peripheral edema, syncope.  Resp: Denies shortness of breath at rest or with exertion. Denies wheezing or cough.  GI: Denies dysphagia or odynophagia. Denies jaundice, hematemesis, fecal incontinence. GU : Denies urinary burning, urinary frequency, urinary hesitancy MS: Denies joint pain, muscle weakness, cramps, or limitation of movement.  Derm: Denies rash, itching, dry skin Psych: Denies depression, anxiety, memory loss, and confusion Heme: Denies bruising, bleeding, and enlarged lymph nodes.   Physical Exam   BP 132/71   Pulse 75   Temp (!) 97.3 F (36.3 C)   Ht 5' 1.5" (1.562 m)   Wt 232 lb (105.2 kg)   BMI 43.13 kg/m  General:   Alert and oriented. Pleasant and cooperative. Well-nourished and well-developed.  Head:  Normocephalic and atraumatic. Eyes:  Without icterus Abdomen:  +BS, soft, non-tender and non-distended. No HSM noted. No guarding or rebound. No masses appreciated.  Rectal:  Deferred  Msk:  Symmetrical without gross deformities. Normal posture. Extremities:  chronic lymphedema  Neurologic:  Alert and  oriented x4;  grossly normal neurologically. Skin:  Intact without significant lesions or rashes. Psych:  Alert and cooperative. Normal mood and affect.   Assessment/Plan   Shelly [name] is an 82 y.o. female presenting today in follow-up with a history of  chronic GERD, diverticular bleed many years prior, IDA with  capsule study showing enteritis due to aspirin use, and chronic normocytic anemia with chronic disease component.  She prefers frequent visits.   She continues to do well, taking PPI just as needed. No concerning upper or lower GI signs/symptoms. She continues to prefer 3 month visits.   We will see her in 3  months. No change to regimen.        Annitta Needs, PhD, ANP-BC [facility]

## 2022-06-26 NOTE — Patient Instructions (Signed)
I will see you in 3 months!  I am very glad you are doing well!  Happy late birthday!!!!!   I enjoyed seeing you again today! At our first visit, I mentioned how I value our relationship and want to provide genuine, compassionate, and quality care. You may receive a survey regarding your visit with me, and I welcome your feedback! Thanks so much for taking the time to complete this. I look forward to seeing you again.   Annitta Needs, PhD, ANP-BC [facility]

## 2022-07-10 ENCOUNTER — Other Ambulatory Visit: Payer: Self-pay | Admitting: Family Medicine

## 2022-07-14 ENCOUNTER — Ambulatory Visit (INDEPENDENT_AMBULATORY_CARE_PROVIDER_SITE_OTHER): Payer: Medicare Other | Admitting: Family Medicine

## 2022-07-14 ENCOUNTER — Encounter: Payer: Self-pay | Admitting: Family Medicine

## 2022-07-14 VITALS — BP 130/65 | HR 68 | Ht 61.5 in | Wt 230.0 lb

## 2022-07-14 DIAGNOSIS — Z23 Encounter for immunization: Secondary | ICD-10-CM

## 2022-07-14 DIAGNOSIS — E0590 Thyrotoxicosis, unspecified without thyrotoxic crisis or storm: Secondary | ICD-10-CM | POA: Diagnosis not present

## 2022-07-14 DIAGNOSIS — I2510 Atherosclerotic heart disease of native coronary artery without angina pectoris: Secondary | ICD-10-CM

## 2022-07-14 DIAGNOSIS — L282 Other prurigo: Secondary | ICD-10-CM

## 2022-07-14 DIAGNOSIS — R7303 Prediabetes: Secondary | ICD-10-CM | POA: Diagnosis not present

## 2022-07-14 DIAGNOSIS — N1832 Chronic kidney disease, stage 3b: Secondary | ICD-10-CM | POA: Diagnosis not present

## 2022-07-14 DIAGNOSIS — F419 Anxiety disorder, unspecified: Secondary | ICD-10-CM

## 2022-07-14 DIAGNOSIS — I129 Hypertensive chronic kidney disease with stage 1 through stage 4 chronic kidney disease, or unspecified chronic kidney disease: Secondary | ICD-10-CM

## 2022-07-14 DIAGNOSIS — I10 Essential (primary) hypertension: Secondary | ICD-10-CM

## 2022-07-14 LAB — BAYER DCA HB A1C WAIVED: HB A1C (BAYER DCA - WAIVED): 7.3 % — ABNORMAL HIGH (ref 4.8–5.6)

## 2022-07-14 NOTE — Addendum Note (Signed)
Addended by: Alphonzo Dublin on: 07/14/2022 04:51 PM   Modules accepted: Orders

## 2022-07-14 NOTE — Progress Notes (Signed)
BP 130/65   Pulse 68   Ht 5' 1.5" (1.562 m)   Wt 230 lb (104.3 kg)   SpO2 97%   BMI 42.75 kg/m    Subjective:   Patient ID: Shelly [name], female    DOB: [birth year], 82 y.o.   MRN: [id number]  HPI: Shelly [name] is a 82 y.o. female presenting on 07/14/2022 for Medical Management of Chronic Issues, Hypertension, and Prediabetes   HPI Hypertension Patient is currently on furosemide and lisinopril metoprolol and amlodipine, and their blood pressure today is 130/65. Patient denies any lightheadedness or dizziness. Patient denies headaches, blurred vision, chest pains, shortness of breath, or weakness. Denies any side effects from medication and is content with current medication.   Subclinical hypothyroidism recheck Patient is coming in for thyroid recheck today as well. They deny any issues with hair changes or heat or cold problems or diarrhea or constipation. They deny any chest pain or palpitations. They are currently on no medication, has been subclinical  CKD recheck Patient is coming in today for CKD recheck.  She denies any urinary issues.  Has been stable.  Pruritus recheck Patient continues to have redness and itchiness on her face, now she has it on her buttock as well but she does not want to show that rash today.  She says it feels and looks the same.  She says she feels like her face peels sometimes and that is very pruritic.  She does admit that montelukast does help but does not get rid of it.  Relevant past medical, surgical, family and social history reviewed and updated as indicated. Interim medical history since our last visit reviewed. Allergies and medications reviewed and updated.  Review of Systems  Constitutional:  Negative for chills and fever.  Eyes:  Negative for visual disturbance.  Respiratory:  Negative for chest tightness and shortness of breath.   Cardiovascular:  Negative for chest pain and leg swelling.  Musculoskeletal:  Negative for back  pain and gait problem.  Skin:  Negative for color change and rash.  Neurological:  Negative for light-headedness and headaches.  Psychiatric/Behavioral:  Negative for agitation and behavioral problems.   All other systems reviewed and are negative.   Per HPI unless specifically indicated above   Allergies as of 07/14/2022       Reactions   Nsaids Other (See Comments)   AVOID all NSAID due to history of GI bleeding   Atorvastatin Other (See Comments)   Joint & muscle pain        Medication List        Accurate as of July 14, 2022  3:37 PM. If you have any questions, ask your nurse or doctor.          amLODipine 5 MG tablet Commonly known as: NORVASC TAKE ONE TABLET BY MOUTH ONCE DAILY   cetirizine 10 MG tablet Commonly known as: ZYRTEC Take 10 mg by mouth daily.   chlorthalidone 25 MG tablet Commonly known as: HYGROTON Take 1 tablet (25 mg total) by mouth daily.   clopidogrel 75 MG tablet Commonly known as: PLAVIX TAKE ONE TABLET BY MOUTH ONCE DAILY (NEEDS TO BE SEEN BEFORE NEXT REFILL)   diclofenac Sodium 1 % Gel Commonly known as: Voltaren Apply 2 g topically 4 (four) times daily.   furosemide 40 MG tablet Commonly known as: LASIX Take 40 mg by mouth daily as needed (weight gain).   gabapentin 100 MG capsule Commonly known as: NEURONTIN Take 1 capsule (  100 mg total) by mouth 3 (three) times daily.   hydrocortisone 2.5 % rectal cream Commonly known as: ANUSOL-HC Place 1 Application rectally 2 (two) times daily. As needed for rectal discomfort.   lisinopril 40 MG tablet Commonly known as: ZESTRIL Take 1 tablet (40 mg total) by mouth daily.   metoprolol succinate 50 MG 24 hr tablet Commonly known as: TOPROL-XL TAKE ONE TABLET BY MOUTH EVERY DAY IMMEDIATELY FOLLOWING A MEAL DAILY   montelukast 10 MG tablet Commonly known as: SINGULAIR Take 1 tablet (10 mg total) by mouth at bedtime.   multivitamin tablet Take 1 tablet by mouth daily.    pantoprazole 40 MG tablet Commonly known as: PROTONIX Take 1 tablet (40 mg total) by mouth as needed. 30 minutes before breakfast   rosuvastatin 20 MG tablet Commonly known as: Crestor Take 1 tablet (20 mg total) by mouth daily.   tizanidine 2 MG capsule Commonly known as: ZANAFLEX Take 1 capsule (2 mg total) by mouth at bedtime as needed for muscle spasms.   triamcinolone 0.025 % ointment Commonly known as: KENALOG Apply 1 application topically 2 (two) times daily.         Objective:   BP 130/65   Pulse 68   Ht 5' 1.5" (1.562 m)   Wt 230 lb (104.3 kg)   SpO2 97%   BMI 42.75 kg/m   Wt Readings from Last 3 Encounters:  07/14/22 230 lb (104.3 kg)  06/26/22 232 lb (105.2 kg)  05/22/22 236 lb (107 kg)    Physical Exam Vitals and nursing note reviewed.  Constitutional:      General: She is not in acute distress.    Appearance: She is well-developed. She is not diaphoretic.  Eyes:     Conjunctiva/sclera: Conjunctivae normal.  Cardiovascular:     Rate and Rhythm: Normal rate and regular rhythm.     Heart sounds: Normal heart sounds. No murmur heard. Pulmonary:     Effort: Pulmonary effort is normal. No respiratory distress.     Breath sounds: Normal breath sounds. No wheezing.  Musculoskeletal:        General: No swelling. Normal range of motion.  Skin:    General: Skin is warm and dry.     Findings: No rash.  Neurological:     Mental Status: She is alert and oriented to person, place, and time.     Coordination: Coordination normal.  Psychiatric:        Behavior: Behavior normal.       Assessment & Plan:   Problem List Items Addressed This Visit       Cardiovascular and Mediastinum   Essential hypertension   CAD (coronary artery disease)     Endocrine   Subclinical hyperthyroidism     Genitourinary   CKD (chronic kidney disease), stage III ([place]) - Primary   Relevant Orders   CBC with Differential/Platelet   CMP14+EGFR   Lipid panel   Bayer  DCA Hb A1c Waived     Other   Anxiety   Prediabetes   Relevant Orders   CBC with Differential/Platelet   CMP14+EGFR   Lipid panel   Bayer DCA Hb A1c Waived   Other Visit Diagnoses     Pruritic rash           Will check blood work for her today.  A1c was up at 7.3.  Discussed diet and lifestyle modification.  For pruritus is already taking montelukast and there is no visible rash that we can  note.  She did not want [place] to look at her buttocks but on her face there is no visible rash.  Follow up plan: Return in about 3 months (around 10/12/2022), or if symptoms worsen or fail to improve, for Prediabetes.  Counseling provided for all of the vaccine components Orders Placed This Encounter  Procedures   CBC with Differential/Platelet   CMP14+EGFR   Lipid panel   Bayer DCA Hb A1c Waived    Caryl Pina, MD [facility] 07/14/2022, 3:37 PM

## 2022-07-15 LAB — CBC WITH DIFFERENTIAL/PLATELET
Basophils Absolute: 0.1 10*3/uL (ref 0.0–0.2)
Basos: 1 %
EOS (ABSOLUTE): 0.1 10*3/uL (ref 0.0–0.4)
Eos: 1 %
Hematocrit: 30.7 % — ABNORMAL LOW (ref 34.0–46.6)
Hemoglobin: 10.2 g/dL — ABNORMAL LOW (ref 11.1–15.9)
Immature Grans (Abs): 0 10*3/uL (ref 0.0–0.1)
Immature Granulocytes: 0 %
Lymphocytes Absolute: 2.7 10*3/uL (ref 0.7–3.1)
Lymphs: 35 %
MCH: 30.7 pg (ref 26.6–33.0)
MCHC: 33.2 g/dL (ref 31.5–35.7)
MCV: 93 fL (ref 79–97)
Monocytes Absolute: 0.6 10*3/uL (ref 0.1–0.9)
Monocytes: 8 %
Neutrophils Absolute: 4.1 10*3/uL (ref 1.4–7.0)
Neutrophils: 55 %
Platelets: 312 10*3/uL (ref 150–450)
RBC: 3.32 x10E6/uL — ABNORMAL LOW (ref 3.77–5.28)
RDW: 13.5 % (ref 11.7–15.4)
WBC: 7.5 10*3/uL (ref 3.4–10.8)

## 2022-07-15 LAB — CMP14+EGFR
ALT: 9 IU/L (ref 0–32)
AST: 11 IU/L (ref 0–40)
Albumin/Globulin Ratio: 1.1 — ABNORMAL LOW (ref 1.2–2.2)
Albumin: 3.8 g/dL (ref 3.7–4.7)
Alkaline Phosphatase: 92 IU/L (ref 44–121)
BUN/Creatinine Ratio: 18 (ref 12–28)
BUN: 30 mg/dL — ABNORMAL HIGH (ref 8–27)
Bilirubin Total: <0.2 mg/dL (ref 0.0–1.2)
CO2: 20 mmol/L (ref 20–29)
Calcium: 9.9 mg/dL (ref 8.7–10.3)
Chloride: 112 mmol/L — ABNORMAL HIGH (ref 96–106)
Creatinine, Ser: 1.66 mg/dL — ABNORMAL HIGH (ref 0.57–1.00)
Globulin, Total: 3.4 g/dL (ref 1.5–4.5)
Glucose: 100 mg/dL — ABNORMAL HIGH (ref 70–99)
Potassium: 4.5 mmol/L (ref 3.5–5.2)
Sodium: 144 mmol/L (ref 134–144)
Total Protein: 7.2 g/dL (ref 6.0–8.5)
eGFR: 31 mL/min/{1.73_m2} — ABNORMAL LOW (ref >59)

## 2022-07-15 LAB — LIPID PANEL
Chol/HDL Ratio: 3 ratio (ref 0.0–4.4)
Cholesterol, Total: 148 mg/dL (ref 100–199)
HDL: 49 mg/dL (ref >39)
LDL Chol Calc (NIH): 82 mg/dL (ref 0–99)
Triglycerides: 91 mg/dL (ref 0–149)
VLDL Cholesterol Cal: 17 mg/dL (ref 5–40)

## 2022-07-29 ENCOUNTER — Ambulatory Visit: Payer: Medicare Other

## 2022-07-30 ENCOUNTER — Telehealth: Payer: Self-pay

## 2022-07-30 ENCOUNTER — Ambulatory Visit: Payer: Medicare Other

## 2022-07-30 NOTE — Telephone Encounter (Signed)
Patient reports that she feels like Gabapentin is not controlling her nerve pain and would like to know what else she can do or what her other options are.  Please advise.

## 2022-07-30 NOTE — Telephone Encounter (Signed)
Well we could increase the gabapentin but I know she was having some memory issues with it or side effects if I recall.  If she is not having side effects then my first option would be to go up on the gabapentin.  If she is having side effects from that low-dose then we would have to discuss other options and she would need to come in for a visit so we can discuss other options.

## 2022-07-31 NOTE — Telephone Encounter (Signed)
Left message making pt aware of all. Advised to call back if she would like to increase the Gabapentin or schedule an appt to discuss alternatives.

## 2022-08-18 DIAGNOSIS — I5032 Chronic diastolic (congestive) heart failure: Secondary | ICD-10-CM | POA: Diagnosis not present

## 2022-08-18 DIAGNOSIS — I129 Hypertensive chronic kidney disease with stage 1 through stage 4 chronic kidney disease, or unspecified chronic kidney disease: Secondary | ICD-10-CM | POA: Diagnosis not present

## 2022-08-18 DIAGNOSIS — D638 Anemia in other chronic diseases classified elsewhere: Secondary | ICD-10-CM | POA: Diagnosis not present

## 2022-08-18 DIAGNOSIS — N1832 Chronic kidney disease, stage 3b: Secondary | ICD-10-CM | POA: Diagnosis not present

## 2022-08-18 DIAGNOSIS — E0590 Thyrotoxicosis, unspecified without thyrotoxic crisis or storm: Secondary | ICD-10-CM | POA: Diagnosis not present

## 2022-08-19 LAB — TSH: TSH: 0.069 u[IU]/mL — ABNORMAL LOW (ref 0.450–4.500)

## 2022-08-19 LAB — T4, FREE: Free T4: 1.03 ng/dL (ref 0.82–1.77)

## 2022-08-19 LAB — T3, FREE: T3, Free: 2.7 pg/mL (ref 2.0–4.4)

## 2022-08-20 ENCOUNTER — Other Ambulatory Visit: Payer: Self-pay | Admitting: Family Medicine

## 2022-08-20 DIAGNOSIS — E1122 Type 2 diabetes mellitus with diabetic chronic kidney disease: Secondary | ICD-10-CM | POA: Diagnosis not present

## 2022-08-20 DIAGNOSIS — N189 Chronic kidney disease, unspecified: Secondary | ICD-10-CM | POA: Diagnosis not present

## 2022-08-20 DIAGNOSIS — D638 Anemia in other chronic diseases classified elsewhere: Secondary | ICD-10-CM | POA: Diagnosis not present

## 2022-08-20 DIAGNOSIS — I129 Hypertensive chronic kidney disease with stage 1 through stage 4 chronic kidney disease, or unspecified chronic kidney disease: Secondary | ICD-10-CM | POA: Diagnosis not present

## 2022-08-20 DIAGNOSIS — N1832 Chronic kidney disease, stage 3b: Secondary | ICD-10-CM

## 2022-08-20 DIAGNOSIS — E870 Hyperosmolality and hypernatremia: Secondary | ICD-10-CM | POA: Diagnosis not present

## 2022-08-20 DIAGNOSIS — I5032 Chronic diastolic (congestive) heart failure: Secondary | ICD-10-CM | POA: Diagnosis not present

## 2022-08-20 DIAGNOSIS — E1129 Type 2 diabetes mellitus with other diabetic kidney complication: Secondary | ICD-10-CM | POA: Diagnosis not present

## 2022-08-20 DIAGNOSIS — I10 Essential (primary) hypertension: Secondary | ICD-10-CM

## 2022-08-20 DIAGNOSIS — L2089 Other atopic dermatitis: Secondary | ICD-10-CM

## 2022-08-20 DIAGNOSIS — R809 Proteinuria, unspecified: Secondary | ICD-10-CM | POA: Diagnosis not present

## 2022-08-21 ENCOUNTER — Ambulatory Visit: Payer: Medicare Other | Admitting: Nurse Practitioner

## 2022-08-21 ENCOUNTER — Other Ambulatory Visit: Payer: Self-pay | Admitting: Family Medicine

## 2022-08-21 ENCOUNTER — Encounter: Payer: Self-pay | Admitting: Nurse Practitioner

## 2022-08-21 ENCOUNTER — Telehealth: Payer: Self-pay | Admitting: Family Medicine

## 2022-08-21 VITALS — BP 131/74 | HR 60 | Ht 61.5 in | Wt 223.6 lb

## 2022-08-21 DIAGNOSIS — E0590 Thyrotoxicosis, unspecified without thyrotoxic crisis or storm: Secondary | ICD-10-CM | POA: Diagnosis not present

## 2022-08-21 NOTE — Progress Notes (Signed)
08/21/2022, 4:05 PM                                     Endocrinology follow-up note    Subjective:    Patient ID: Shelly [name], female    DOB: 12-26-1940, PCP [name], [name], MD   Past Medical History:  Diagnosis Date   Allergy    Anemia    GI bleed   Anginal pain    Anxiety    Arthritis    Blood transfusion without reported diagnosis    CAD (coronary artery disease)    a. s/p NSTEMI in 2015 with DES x2 to RCA   Cataract    Chronic kidney disease    Coronary artery disease    SEVERE   GERD (gastroesophageal reflux disease)    GI bleed 12/2013   Goiter    Hyperlipidemia    Hypertension    Myocardial infarct    Myocardial infarction 08/2013   Neuralgia of right lower extremity 07/13/2017   Pancolonic diverticulosis 12/2013   Post herpetic neuralgia    Past Surgical History:  Procedure Laterality Date   CARPAL TUNNEL RELEASE Bilateral    COLONOSCOPY N/A 01/13/2014   Dr. Hung:pandiverticulosis    COLONOSCOPY N/A 12/14/2015   Dr. Michail Sermon: pancolonic diverticulosis, internal hemorrhoids    CORNEAL TRANSPLANT     CORONARY ANGIOPLASTY  08/2013   ESOPHAGOGASTRODUODENOSCOPY N/A 12/14/2015   Dr. Michail Sermon: normal    GIVENS CAPSULE STUDY N/A 08/13/2016   Dr. Oneida Alar: enteritis due to aspirin.    HEEL SPUR EXCISION     LEFT HEART CATHETERIZATION WITH CORONARY ANGIOGRAM N/A 08/23/2013   Procedure: LEFT HEART CATHETERIZATION WITH CORONARY ANGIOGRAM;  Surgeon: Peter M Martinique, MD;  Location[facility] C[facility] LAB;  Service: Cardiovascular;  Laterality: N/A;   PERCUTANEOUS CORONARY STENT INTERVENTION (PCI-S) N/A 08/24/2013   Procedure: PERCUTANEOUS CORONARY STENT INTERVENTION (PCI-S);  Surgeon: Peter M Martinique, MD;  Location: [facility] CATH LAB;  Service: Cardiovascular;  Laterality: N/A;   TONSILLECTOMY     TUBAL LIGATION     Social History   Socioeconomic History   Marital status: Divorced    Spouse name: Not on file   Number of  children: 8   Years of education: 11   Highest education level: 11th grade  Occupational History   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   No daily caffeine use.   2 story home but she only has to use one level   Social Determinants of Health   Financial Resource Strain: Low Risk  (08/08/2021)  Overall Financial Resource Strain (CARDIA)    Difficulty of Paying Living Expenses: Not hard at all  Food Insecurity: No Food Insecurity (08/08/2021)   Hunger Vital Sign    Worried About Running Out of Food in the Last Year: Never true    Ran Out of Food in the Last Year: Never true  Transportation Needs: No Transportation Needs (08/08/2021)   PRAPARE - Hydrologist (Medical): No    Lack of Transportation (Non-Medical): No  Physical Activity: Inactive (12/17/2021)   Exercise Vital Sign    Days of Exercise per Week: 0 days    Minutes of Exercise per Session: 0 min  Stress: Stress Concern Present (12/17/2021)   [place]    Feeling of Stress : To some extent  Social Connections: Socially Isolated (08/08/2021)   Social Connection and Isolation Panel [NHANES]    Frequency of Communication with Friends and Family: More than three times a week    Frequency of Social Gatherings with Friends and Family: More than three times a week    Attends Religious Services: Never    Marine scientist  or Organizations: No    Attends Archivist Meetings: Never    Marital Status: Widowed   Family History  Problem Relation Age of Onset   Hypertension Brother    Transient ischemic attack Brother    Cancer Mother        unsure of type    Other Father        grangrene    Asthma Sister    Stroke Sister    COPD Sister    Stroke Sister    Early death Brother    Early death Brother    Liver cancer Daughter    Cancer Daughter    Hernia Son        Umbilical   GI problems Son    Pancreatic disease Daughter        pancreatectomy   Cancer Daughter    Diverticulitis Daughter    Arthritis Daughter        knee    Arthritis Daughter        shoulder    Anemia Daughter    GER disease Son    Colon cancer Neg Hx    Colon polyps Neg Hx    Outpatient Encounter Medications as of 08/21/2022  Medication Sig   amLODipine (NORVASC) 5 MG tablet TAKE ONE TABLET BY MOUTH ONCE DAILY   cetirizine (ZYRTEC) 10 MG tablet Take 10 mg by mouth daily.   chlorthalidone (HYGROTON) 25 MG tablet Take 1 tablet (25 mg total) by mouth daily.   clopidogrel (PLAVIX) 75 MG tablet TAKE ONE TABLET BY MOUTH ONCE DAILY (NEEDS TO BE SEEN BEFORE NEXT REFILL)   diclofenac Sodium (VOLTAREN) 1 % GEL Apply 2 g topically 4 (four) times daily.   furosemide (LASIX) 40 MG tablet Take 40 mg by mouth daily as needed (weight gain).   gabapentin (NEURONTIN) 100 MG capsule Take 1 capsule (100 mg total) by mouth 3 (three) times daily.   hydrocortisone (ANUSOL-HC) 2.5 % rectal cream Place 1 Application rectally 2 (two) times daily. As needed for rectal discomfort.   lisinopril (ZESTRIL) 40 MG tablet Take 1 tablet (40 mg total) by mouth daily.   metoprolol succinate (TOPROL-XL) 50 MG 24 hr tablet TAKE ONE TABLET BY MOUTH EVERY DAY IMMEDIATELY FOLLOWING A MEAL DAILY   montelukast (SINGULAIR) 10 MG tablet  TAKE ONE TABLET BY MOUTH AT BEDTIME   Multiple Vitamin (MULTIVITAMIN) tablet Take 1 tablet by mouth daily.   pantoprazole  (PROTONIX) 40 MG tablet Take 1 tablet (40 mg total) by mouth as needed. 30 minutes before breakfast   rosuvastatin (CRESTOR) 20 MG tablet Take 1 tablet (20 mg total) by mouth daily.   tizanidine (ZANAFLEX) 2 MG capsule Take 1 capsule (2 mg total) by mouth at bedtime as needed for muscle spasms.   triamcinolone (KENALOG) 0.025 % ointment Apply 1 application topically 2 (two) times daily.   [DISCONTINUED] montelukast (SINGULAIR) 10 MG tablet Take 1 tablet (10 mg total) by mouth at bedtime.   No facility-administered encounter medications on file as of 08/21/2022.   ALLERGIES: Allergies  Allergen Reactions   Nsaids Other (See Comments)    AVOID all NSAID due to history of GI bleeding   Atorvastatin Other (See Comments)    Joint & muscle pain    VACCINATION STATUS: Immunization History  Administered Date(s) Administered   Pneumococcal Conjugate-13 10/02/2016   Pneumococcal Polysaccharide-23 08/25/2013   Tdap 07/14/2022   Zoster Recombinat (Shingrix) 03/17/2018    Thyroid Problem Presents for follow-up visit. Patient reports no anxiety, cold intolerance, constipation, depressed mood, fatigue, heat intolerance, leg swelling, palpitations, tremors, weight gain or weight loss. The symptoms have been stable.    Shelly [name] is 82 y.o. female who is seen in follow-up after she was seen in consultation for subclinical hyperthyroidism.    PMD: [name], [name], MD.  Due to septic symptoms of thyrotoxicosis, she was started on low-dose methimazole, which was since tapered down.  She has been off the Methimazole for some time now with stable thyroid response.  She denies any history of goiter, no family history of thyroid dysfunction or thyroid malignancy.  Her thyroid uptake and scan on June 14, 2019 showed 24-hour uptake of 10.3% which is normal.  There was asymmetric thyroid activity with generally increased uptake in the right lobe.  No focal hot or cold nodules  identified.  Her medical history includes hypertension and hyperlipidemia on treatment.  Review of systems  Constitutional: + steadily decreasing body weight,  current Body mass index is 41.56 kg/m. , no fatigue, no subjective hyperthermia, no subjective hypothermia Eyes: no blurry vision, no xerophthalmia ENT: no sore throat, no nodules palpated in throat, no dysphagia/odynophagia, no hoarseness Cardiovascular: no chest pain, no shortness of breath, no palpitations, no leg swelling Respiratory: no cough, no shortness of breath Gastrointestinal: no nausea/vomiting/diarrhea Musculoskeletal: no muscle/joint aches Skin: no rashes, no hyperemia Neurological: no tremors, no numbness, no tingling, no dizziness Psychiatric: no depression, no anxiety  Objective:    BP 131/74 (BP Location: Right Arm, Patient Position: Sitting, Cuff Size: Large)   Pulse 60   Ht 5' 1.5" (1.562 m)   Wt 223 lb 9.6 oz (101.4 kg)   BMI 41.56 kg/m   Wt Readings from Last 3 Encounters:  08/21/22 223 lb 9.6 oz (101.4 kg)  07/14/22 230 lb (104.3 kg)  06/26/22 232 lb (105.2 kg)    BP Readings from Last 3 Encounters:  08/21/22 131/74  07/14/22 130/65  06/26/22 132/71     Physical Exam- Limited  Constitutional:  Body mass index is 41.56 kg/m. , not in acute distress, normal state of mind Eyes:  EOMI, no exophthalmos Musculoskeletal: no gross deformities, strength intact in all four extremities, no gross restriction of joint movements Skin:  no rashes, no hyperemia Neurological: no tremor with outstretched hands  CMP     Component Value Date/Time   NA 144 07/14/2022 1500   K 4.5 07/14/2022 1500   CL 112 (H) 07/14/2022 1500   CO2 20 07/14/2022 1500   GLUCOSE 100 (H) 07/14/2022 1500   GLUCOSE 114 (H) 04/05/2021 1319   BUN 30 (H) 07/14/2022 1500   CREATININE 1.66 (H) 07/14/2022 1500   CREATININE 1.31 (H) 12/21/2018 1547   CALCIUM 9.9 07/14/2022 1500   PROT 7.2 07/14/2022 1500   ALBUMIN 3.8  07/14/2022 1500   AST 11 07/14/2022 1500   ALT 9 07/14/2022 1500   ALKPHOS 92 07/14/2022 1500   BILITOT <0.2 07/14/2022 1500   GFRNONAA 32 (L) 04/05/2021 1319   GFRAA 34 (L) 04/30/2020 1510     Diabetic Labs (most recent): Lab Results  Component Value Date   HGBA1C 7.3 (H) 07/14/2022   HGBA1C 5.9 (H) 01/31/2022   HGBA1C 6.0 (H) 12/09/2015   MICROALBUR 18.6 (H) 04/05/2021     Lipid Panel ( most recent) Lipid Panel     Component Value Date/Time   CHOL 148 07/14/2022 1500   TRIG 91 07/14/2022 1500   HDL 49 07/14/2022 1500   CHOLHDL 3.0 07/14/2022 1500   CHOLHDL 2.6 11/20/2016 0854   VLDL 11 11/20/2016 0854   LDLCALC 82 07/14/2022 1500   LABVLDL 17 07/14/2022 1500      Lab Results  Component Value Date   TSH 0.069 (L) 08/18/2022   TSH 0.175 (L) 05/21/2022   TSH 0.134 (L) 02/03/2022   TSH 0.287 (L) 01/31/2022   TSH 0.057 (L) 10/30/2021   TSH 0.062 (L) 10/17/2021   TSH 0.195 (L) 07/24/2021   TSH 0.277 (L) 06/27/2021   TSH 0.224 (L) 04/02/2021   TSH 0.120 (L) 02/08/2021   FREET4 1.03 08/18/2022   FREET4 0.98 05/21/2022   FREET4 1.11 02/03/2022   FREET4 1.11 10/17/2021   FREET4 0.96 07/24/2021   FREET4 0.88 04/02/2021   FREET4 1.02 10/16/2020   FREET4 1.00 07/04/2020   FREET4 0.97 05/03/2020   FREET4 1.0 01/02/2020     Latest Reference Range & Units 10/30/21 15:36 01/31/22 15:57 02/03/22 15:13 05/21/22 14:50 08/18/22 14:37  TSH 0.450 - 4.500 uIU/mL 0.057 (L) 0.287 (L) 0.134 (L) 0.175 (L) 0.069 (L)  Triiodothyronine,Free,Serum 2.0 - 4.4 pg/mL   3.1 2.8 2.7  T4,Free(Direct) 0.82 - 1.77 ng/dL   1.11 0.98 1.03  Thyroxine (T4) 4.5 - 12.0 ug/dL 6.1 6.1     Free Thyroxine Index 1.2 - 4.9  1.7 1.4     T3 Uptake Ratio 24 - 39 % 28 23 (L)     (L): Data is abnormally low  Assessment & Plan:   1. Subclinical hyperthyroidism  -Her most recent thyroid function tests are once again stable, TSH still minimally suppressed but FT3 and FT4 are normal.  There is no need for  antithyroid treatment at this time.   Will monitor TFTs again in 3 months (patient prefers to have more frequent checkups than the 6 month I offered her).    - she is advised to maintain close follow up with [name], [name], MD for primary care needs.    I spent  30  minutes in the care of the patient today including review of labs from Thyroid Function, CMP, and other relevant labs ; imaging/biopsy records (current and previous including abstractions from other facilities); face-to-face time discussing  her lab results and symptoms, medications doses, her options of short and long term treatment based on the latest  standards of care / guidelines;   and documenting the encounter.  Shelly [name]  participated in the discussions, expressed understanding, and voiced agreement with the above plans.  All questions were answered to her satisfaction. she is encouraged to contact clinic should she have any questions or concerns prior to her return visit.   Follow up plan: Return in about 3 months (around 11/20/2022) for Thyroid follow up, Previsit labs.   Rayetta Pigg, [facility] [address] [place], [place] [address] Phone: [phone] Fax: [phone]   08/21/2022, 4:05 PM

## 2022-08-21 NOTE — Telephone Encounter (Signed)
Contacted [name] to schedule their annual wellness visit. Appointment made for 08/27/2022.  Thank you,  Colletta [place],  [facility] Direct Dial ??[phone]

## 2022-08-27 ENCOUNTER — Telehealth: Payer: Self-pay

## 2022-08-27 ENCOUNTER — Ambulatory Visit (INDEPENDENT_AMBULATORY_CARE_PROVIDER_SITE_OTHER): Payer: Medicare Other

## 2022-08-27 VITALS — Ht 61.5 in | Wt 223.0 lb

## 2022-08-27 DIAGNOSIS — Z0000 Encounter for general adult medical examination without abnormal findings: Secondary | ICD-10-CM | POA: Diagnosis not present

## 2022-08-27 NOTE — Progress Notes (Signed)
Subjective:   Shelly [name] is a 82 y.o. female who presents for Medicare Annual (Subsequent) preventive examination.  I connected with  [name] on 08/27/22 by a audio enabled telemedicine application and verified that I am speaking with the correct person using two identifiers.  Patient Location: Home  Provider Location: Home Office  I discussed the limitations of evaluation and management by telemedicine. The patient expressed understanding and agreed to proceed.  Review of Systems     Cardiac Risk Factors include: advanced age (>52men, >64 women);hypertension;sedentary lifestyle     Objective:    Today's Vitals   08/27/22 1442  Weight: 223 lb (101.2 kg)  Height: 5' 1.5" (1.562 m)   Body mass index is 41.45 kg/m.     08/08/2021   10:16 AM 01/22/2021    3:57 PM 08/07/2020   10:22 AM 08/03/2019    9:00 AM 08/02/2018    8:30 AM 11/18/2016    9:20 PM 10/02/2016   10:04 AM  Advanced Directives  Does Patient Have a Medical Advance Directive? Yes Yes Yes Yes Yes Yes No  Type of Estate agent of [place];Living will  Healthcare Power of [place];Living will Healthcare Power of [place];Living will Living will;Healthcare Power of Attorney Out of facility DNR (pink MOST or yellow form) Out of facility DNR (pink MOST or yellow form)  Does patient want to make changes to medical advance directive?   No - Patient declined No - Patient declined  No - Patient declined   Copy of Healthcare Power of Attorney in Chart? Yes - validated most recent copy scanned in chart (See row information)  No - copy requested Yes - validated most recent copy scanned in chart (See row information) No - copy requested    Would patient like information on creating a medical advance directive?       Yes (MAU/Ambulatory/Procedural Areas - Information given)  Pre-existing out of facility DNR order (yellow form or pink MOST form)      Yellow form placed in chart (order not valid for  inpatient use) Yellow form placed in chart (order not valid for inpatient use)    Current Medications (verified) Outpatient Encounter Medications as of 08/27/2022  Medication Sig   amLODipine (NORVASC) 5 MG tablet TAKE ONE TABLET BY MOUTH ONCE DAILY   cetirizine (ZYRTEC) 10 MG tablet Take 10 mg by mouth daily.   chlorthalidone (HYGROTON) 25 MG tablet Take 1 tablet (25 mg total) by mouth daily.   clopidogrel (PLAVIX) 75 MG tablet Take 1 tablet (75 mg total) by mouth daily.   diclofenac Sodium (VOLTAREN) 1 % GEL Apply 2 g topically 4 (four) times daily.   furosemide (LASIX) 40 MG tablet Take 40 mg by mouth daily as needed (weight gain).   gabapentin (NEURONTIN) 100 MG capsule Take 1 capsule (100 mg total) by mouth 3 (three) times daily.   hydrocortisone (ANUSOL-HC) 2.5 % rectal cream Place 1 Application rectally 2 (two) times daily. As needed for rectal discomfort.   lisinopril (ZESTRIL) 40 MG tablet Take 1 tablet (40 mg total) by mouth daily.   metoprolol succinate (TOPROL-XL) 50 MG 24 hr tablet TAKE ONE TABLET BY MOUTH EVERY DAY IMMEDIATELY FOLLOWING A MEAL DAILY   montelukast (SINGULAIR) 10 MG tablet TAKE ONE TABLET BY MOUTH AT BEDTIME   Multiple Vitamin (MULTIVITAMIN) tablet Take 1 tablet by mouth daily.   pantoprazole (PROTONIX) 40 MG tablet Take 1 tablet (40 mg total) by mouth as needed. 30 minutes before  breakfast   rosuvastatin (CRESTOR) 20 MG tablet Take 1 tablet (20 mg total) by mouth daily.   tizanidine (ZANAFLEX) 2 MG capsule Take 1 capsule (2 mg total) by mouth at bedtime as needed for muscle spasms.   triamcinolone (KENALOG) 0.025 % ointment Apply 1 application topically 2 (two) times daily.   hydrALAZINE (APRESOLINE) 50 MG tablet Take by mouth. (Patient not taking: Reported on 08/27/2022)   No facility-administered encounter medications on file as of 08/27/2022.    Allergies (verified) Nsaids and Atorvastatin   History: Past Medical History:  Diagnosis Date   Allergy     Anemia    GI bleed   Anginal pain    Anxiety    Arthritis    Blood transfusion without reported diagnosis    CAD (coronary artery disease)    a. s/p NSTEMI in 2015 with DES x2 to RCA   Cataract    Chronic kidney disease    Coronary artery disease    SEVERE   GERD (gastroesophageal reflux disease)    GI bleed 12/2013   Goiter    Hyperlipidemia    Hypertension    Myocardial infarct    Myocardial infarction 08/2013   Neuralgia of right lower extremity 07/13/2017   Pancolonic diverticulosis 12/2013   Post herpetic neuralgia    Past Surgical History:  Procedure Laterality Date   CARPAL TUNNEL RELEASE Bilateral    COLONOSCOPY N/A 01/13/2014   Dr. Hung:pandiverticulosis    COLONOSCOPY N/A 12/14/2015   Dr. Bosie Clos: pancolonic diverticulosis, internal hemorrhoids    CORNEAL TRANSPLANT     CORONARY ANGIOPLASTY  08/2013   ESOPHAGOGASTRODUODENOSCOPY N/A 12/14/2015   Dr. Bosie Clos: normal    GIVENS CAPSULE STUDY N/A 08/13/2016   Dr. Darrick Penna: enteritis due to aspirin.    HEEL SPUR EXCISION     LEFT HEART CATHETERIZATION WITH CORONARY ANGIOGRAM N/A 08/23/2013   Procedure: LEFT HEART CATHETERIZATION WITH CORONARY ANGIOGRAM;  Surgeon: [name], MD;  Location[facility] C[facility] LAB;  Service: Cardiovascular;  Laterality: N/A;   PERCUTANEOUS CORONARY STENT INTERVENTION (PCI-S) N/A 08/24/2013   Procedure: PERCUTANEOUS CORONARY STENT INTERVENTION (PCI-S);  Surgeon: [name], MD;  Location: [facility] CATH LAB;  Service: Cardiovascular;  Laterality: N/A;   TONSILLECTOMY     TUBAL LIGATION     Family History  Problem Relation Age of Onset   Hypertension Brother    Transient ischemic attack Brother    Cancer Mother        unsure of type    Other Father        grangrene    Asthma Sister    Stroke Sister    COPD Sister    Stroke Sister    Early death Brother    Early death Brother    Liver cancer Daughter    Cancer Daughter    Hernia Son        Umbilical   GI problems Son    Pancreatic disease  Daughter        pancreatectomy   Cancer Daughter    Diverticulitis Daughter    Arthritis Daughter        knee    Arthritis Daughter        shoulder    Anemia Daughter    GER disease Son    Colon cancer Neg Hx    Colon polyps Neg Hx    Social History   Socioeconomic History   Marital status: Divorced    Spouse name: Not on file   Number  of children: 8   Years of education: 11   Highest education level: 11th grade  Occupational History   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   No daily caffeine use.   2 story home but she only has to use one level   Social Determinants of Health   Financial Resource Strain: Low Risk  (08/08/2021)   Overall Financial Resource Strain (CARDIA)    Difficulty of Paying Living Expenses: Not hard at all  Food Insecurity: No Food Insecurity (08/08/2021)   Hunger Vital Sign    Worried About Running Out of Food in the Last Year: Never true    Ran Out of Food in the Last Year: Never true  Transportation Needs: No Transportation Needs (08/08/2021)   PRAPARE - Administrator, Civil Service (Medical): No    Lack of Transportation (Non-Medical): No  Physical Activity: Inactive (12/17/2021)   Exercise Vital Sign    Days of Exercise per Week: 0 days    Minutes of Exercise per Session: 0 min  Stress: Stress Concern Present (12/17/2021)   [employer] of Occupational Health - Occupational Stress Questionnaire    Feeling of  Stress : To some extent  Social Connections: Socially Isolated (08/08/2021)   Social Connection and Isolation Panel [NHANES]    Frequency of Communication with Friends and Family: More than three times a week    Frequency of Social Gatherings with Friends and Family: More than three times a week    Attends Religious Services: Never    Database administrator or Organizations: No    Attends Banker Meetings: Never    Marital Status: Widowed    Tobacco Counseling Counseling given: Not Answered   Clinical Intake:  Pre-visit preparation completed: Yes  Pain : No/denies pain  Diabetes: No  How often do you need to have someone help you when you read instructions, pamphlets, or other written materials from your doctor or pharmacy?: 1 - Never  Diabetic?No   Interpreter Needed?: No  Information entered by :: Kandis Fantasia LPN   Activities of Daily Living    08/27/2022    2:53 PM  In your present state of health, do you have any difficulty performing the following activities:  Hearing? 0  Vision? 0  Difficulty concentrating or making decisions? 0  Walking or climbing stairs? 1  Dressing or bathing? 0  Doing errands, shopping? 1  Preparing Food and eating ? N  Using the Toilet? N  In the past six months, have you accidently leaked urine? N  Do you have problems with loss of bowel control? N  Managing your Medications? N  Managing your Finances? Y  Housekeeping or managing your Housekeeping? Y    Patient Care Team: [name], Elige Radon, MD as PCP - General (Family Medicine) Wyline Mood Dorothe Pea, MD as PCP - Cardiology (Cardiology) Danella Maiers, [facility] as Pharmacist (  Family Medicine) Randa LynnBhutani, Manpreet S, MD as Consulting Physician (Nephrology) Iran OuchStrader, Comer LocketBrittany M, PA-C as Physician Assistant (Physician Assistant) Dani Gobbleeardon, Whitney J, NP as Nurse Practitioner (Endocrinology) Delora FuelJohnson, Brent, OD (Optometry)  Indicate any recent Medical Services you may have  received from other than [facility] providers in the past year (date may be approximate).     Assessment:   This is a routine wellness examination for Bosie ClosJudith.  Hearing/Vision screen Hearing Screening - Comments:: Denies hearing difficulties  Vision Screening - Comments::  up to date with routine eye exams    Dietary issues and exercise activities discussed: Current Exercise Habits: The patient does not participate in regular exercise at present   Goals Addressed               This Visit's Progress     COMPLETED: Client will talk with LCSW in next 30 days about depression sympotms related to health conditions faced (pt-stated)        CARE PLAN ENTRY (see longtitudinal plan of care for additional care plan information)  Current Barriers:  Decreased appetite Mobility issues in client with Chronic diagnoses of HTN, GERD,Anxiety,CAD, CKD,OA Grief issues faced  Clinical Social Work Clinical Goal(s):  LCSW will talk with client in next 30 days to discuss depression symptoms of client related to her health conditions  Interventions: Talked with client about CCM services Talked with client about her upcoming medical appointments Provided counseling support for client (she experienced the death of one daughter one year ago and experienced death of another daughter 4 years ago) Encouraged client to talk with RNCM as needed to address nursing issues she faced Talked with client about client decreased energy Talked with client [facility] [place] Client and LCSW talked about client's pain issues related to shingles issues she faces  Patient Self Care Activities:   Takes medications as prescribed Attends scheduled medical appointments  Patient self care deficits: Mobility issues   Initial goal documentation       COMPLETED: Manage Grief issues faced; manage depression issues faced; Manage Emotions (pt-stated)        Timeframe:   Short-Term Goal Priority:  Medium Progress: On Track Start Date:  08/07/21                            Expected End Date:  02/10/22                        Follow Up Date   LCSW is discharging client today from CCM SW services   Manage My Emotions (Patient) Manage Grief issues faced ; Manage depression issues faced   Why is this important?   When you are stressed, down or upset, your body reacts too.  For example, your blood pressure may get higher; you may have a headache or stomachache.  When your emotions get the best of you, your body's ability to fight off cold and flu gets weak.  These steps will help you manage your emotions.     Patient Self Care Activities:  Eats meals independently Takes medications as prescribed Attends scheduled medical appointments  Patient Coping Strengths:  Completes ADLs Completes IADLS Makes transport arrangements as needed Attends medical appointments Communicates with LCSW or RNCM as needed for CCM support  Patient Self Care Deficits:  Grief issues Depression issues  Patient Goals:  - spend time or talk with others every day -  practice relaxation or meditation daily - keep a calendar with appointment dates Talk with RNCM or LCSW as needed for support  Follow Up Plan: LCSW  is discharging client today from CCM SW services.       COMPLETED: Weight (lb) < 200 lb (90.7 kg)   223 lb (101.2 kg)     Depression Screen    07/14/2022    3:03 PM 01/31/2022    3:23 PM 12/17/2021    3:29 PM 12/02/2021    9:23 AM 11/11/2021    3:53 PM 10/30/2021    3:42 PM 10/08/2021    2:17 PM  PHQ 2/9 Scores  PHQ - 2 Score PHQ- 9 Score Fall Risk    08/27/2022    2:52 PM 07/14/2022    3:03 PM 01/31/2022    3:23 PM 12/02/2021    9:23 AM 10/30/2021    3:08 PM  Fall Risk   Falls in the past year? Number falls in past yr: 0 0 0 0   Injury with Fall? 1 1 0 0   Risk for fall due to : History of fall(s);Impaired  balance/gait;Impaired mobility Impaired balance/gait Impaired balance/gait Impaired balance/gait   Follow up Falls evaluation completed;Education provided;Falls prevention discussed Falls evaluation completed Falls evaluation completed Falls evaluation completed     FALL RISK PREVENTION PERTAINING TO THE HOME:  Any stairs in or around the home? No  If so, are there any without handrails? No  Home free of loose throw rugs in walkways, pet beds, electrical cords, etc? Yes  Adequate lighting in your home to reduce risk of falls? Yes   ASSISTIVE DEVICES UTILIZED TO PREVENT FALLS:  Life alert? No  Use of a cane, walker or w/c? Yes  Grab bars in the bathroom? Yes  Shower chair or bench in shower? Yes  Elevated toilet seat or a handicapped toilet? Yes   TIMED UP AND GO:  Was the test performed? No . Telephonic visit   Cognitive Function:    08/02/2018    8:33 AM  MMSE - Mini Mental State Exam  Orientation to time 4  Orientation to Place 5  Registration 3  Attention/ Calculation 4  Recall 3  Language- name 2 objects 2  Language- repeat 1  Language- follow 3 step command 1  Language- read & follow direction 1  Write a sentence 1  Copy design 1  Total score 26        08/27/2022    2:53 PM 08/08/2021   10:15 AM 08/07/2020   10:15 AM 08/03/2019    9:07 AM  6CIT Screen  What Year? 0 points 0 points 0 points 0 points  What month? 0 points 0 points 0 points 0 points  What time? 0 points 0 points 0 points 0 points  Count back from 20 0 points 0 points 0 points 0 points  Months in reverse 2 points 4 points 0 points 4 points  Repeat phrase 2 points 2 points 0 points 0 points  Total Score 4 points 6 points 0 points 4 points    Immunizations Immunization History  Administered Date(s) Administered   Pneumococcal Conjugate-13 10/02/2016   Pneumococcal Polysaccharide-23 08/25/2013   Tdap 07/14/2022   Zoster Recombinat (Shingrix) 03/17/2018    TDAP status: Up to date  Flu  Vaccine status: Up to date  Pneumococcal vaccine status: Up  to date  Covid-19 vaccine status: Information provided on how to obtain vaccines.   Qualifies for Shingles Vaccine? Yes   Zostavax completed Yes   Shingrix Completed?: No.    Education has been provided regarding the importance of this vaccine. Patient has been advised to call insurance company to determine out of pocket expense if they have not yet received this vaccine. Advised may also receive vaccine at local pharmacy or Health Dept. Verbalized acceptance and understanding.  Screening Tests Health Maintenance  Topic Date Due   COVID-19 Vaccine (1) 10/27/2022 (Originally 06/14/1945)   Zoster Vaccines- Shingrix (2 of 2) 10/27/2022 (Originally 05/12/2018)   INFLUENZA VACCINE  12/18/2022   Medicare Annual Wellness (AWV)  08/27/2023   DTaP/Tdap/Td (2 - Td or Tdap) 07/14/2032   Pneumonia Vaccine 73+ Years old  Completed   DEXA SCAN  Completed   HPV VACCINES  Aged Out    Health Maintenance  There are no preventive care reminders to display for this patient.   Colorectal cancer screening: No longer required.   Mammogram status: No longer required due to age.  Bone Density status: Completed 08/02/18. Results reflect: Bone density results: NORMAL. Repeat every 5 years.  Lung Cancer Screening: (Low Dose CT Chest recommended if Age 25-80 years, 30 pack-year currently smoking OR have quit w/in 15years.) does not qualify.   Lung Cancer Screening Referral: n/a  Additional Screening:  Hepatitis C Screening: does not qualify;  Vision Screening: Recommended annual ophthalmology exams for early detection of glaucoma and other disorders of the eye. Is the patient up to date with their annual eye exam?  Yes  Who is the provider or what is the name of the office in which the patient attends annual eye exams? [facility]  If pt is not established with a provider, would they like to be referred to a provider to establish care? No  .   Dental Screening: Recommended annual dental exams for proper oral hygiene  Community Resource Referral / Chronic Care Management: CRR required this visit?  No   CCM required this visit?  No      Plan:     I have personally reviewed and noted the following in the patient's chart:   Medical and social history Use of alcohol, tobacco or illicit drugs  Current medications and supplements including opioid prescriptions. Patient is not currently taking opioid prescriptions. Functional ability and status Nutritional status Physical activity Advanced directives List of other physicians Hospitalizations, surgeries, and ER visits in previous 12 months Vitals Screenings to include cognitive, depression, and falls Referrals and appointments  In addition, I have reviewed and discussed with patient certain preventive protocols, quality metrics, and best practice recommendations. A written personalized care plan for preventive services as well as general preventive health recommendations were provided to patient.     Durwin Nora, [place]   1/61/0960   Due to this being a virtual visit, the after visit summary with patients personalized plan was offered to patient via mail or my-chart.  per request, patient was mailed a copy of AVS  Nurse Notes: See telephone note with patient concerns.

## 2022-08-27 NOTE — Patient Instructions (Signed)
Shelly [name] , Thank you for taking time to come for your Medicare Wellness Visit. I appreciate your ongoing commitment to your health goals. Please review the following plan we discussed and let me know if I can assist you in the future.   These are the goals we discussed:  Goals       Blood Pressure < 140/90      Client will talk with LCSW in next 30 days about depression sympotms related to health conditions faced (pt-stated)      CARE PLAN ENTRY (see longtitudinal plan of care for additional care plan information)  Current Barriers:  Decreased appetite Mobility issues in client with Chronic diagnoses of HTN, GERD,Anxiety,CAD, CKD,OA Grief issues faced  Clinical Social Work Clinical Goal(s):  LCSW will talk with client in next 30 days to discuss depression symptoms of client related to her health conditions  Interventions: Talked with client about CCM services Talked with client about her upcoming medical appointments Provided counseling support for client (she experienced the death of one daughter one year ago and experienced death of another daughter 4 years ago) Encouraged client to talk with RNCM as needed to address nursing issues she faced Talked with client about client decreased energy Talked with client about grief support resources through [facility] Client and LCSW talked about client's pain issues related to shingles issues she faces  Patient Self Care Activities:   Takes medications as prescribed Attends scheduled medical appointments  Patient self care deficits: Mobility issues   Initial goal documentation       DIET - INCREASE WATER INTAKE      Try to drink 6-8 glasses of water daily      Manage Grief issues faced; manage depression issues faced; Manage Emotions (pt-stated)      Timeframe:  Short-Term Goal Priority:  Medium Progress: On Track Start Date:  08/07/21                            Expected End Date:  02/10/22                         Follow Up Date   LCSW is discharging client today from CCM SW services   Manage My Emotions (Patient) Manage Grief issues faced ; Manage depression issues faced   Why is this important?   When you are stressed, down or upset, your body reacts too.  For example, your blood pressure may get higher; you may have a headache or stomachache.  When your emotions get the best of you, your body's ability to fight off cold and flu gets weak.  These steps will help you manage your emotions.     Patient Self Care Activities:  Eats meals independently Takes medications as prescribed Attends scheduled medical appointments  Patient Coping Strengths:  Completes ADLs Completes IADLS Makes transport arrangements as needed Attends medical appointments Communicates with LCSW or RNCM as needed for CCM support  Patient Self Care Deficits:  Grief issues Depression issues  Patient Goals:  - spend time or talk with others every day - practice relaxation or meditation daily - keep a calendar with appointment dates Talk with RNCM or LCSW as needed for support  Follow Up Plan: LCSW  is discharging client today from CCM SW services.       Prevent falls      Weight (lb) < 200 lb (90.7 kg)  This is a list of the screening recommended for you and due dates:  Health Maintenance  Topic Date Due   Medicare Annual Wellness Visit  08/09/2022   COVID-19 Vaccine (1) 10/27/2022*   Zoster (Shingles) Vaccine (2 of 2) 10/27/2022*   Flu Shot  12/18/2022   DTaP/Tdap/Td vaccine (2 - Td or Tdap) 07/14/2032   Pneumonia Vaccine  Completed   DEXA scan (bone density measurement)  Completed   HPV Vaccine  Aged Out  *Topic was postponed. The date shown is not the original due date.    Advanced directives: We have a copy of your advanced directives available in your record should your provider ever need to access them.   Conditions/risks identified: Aim for 30 minutes of exercise or brisk walking,  6-8 glasses of water, and 5 servings of fruits and vegetables each day.   Next appointment: Follow up in one year for your annual wellness visit    Preventive Care 65 Years and Older, Female Preventive care refers to lifestyle choices and visits with your health care provider that can promote health and wellness. What does preventive care include? A yearly physical exam. This is also called an annual well check. Dental exams once or twice a year. Routine eye exams. Ask your health care provider how often you should have your eyes checked. Personal lifestyle choices, including: Daily care of your teeth and gums. Regular physical activity. Eating a healthy diet. Avoiding tobacco and drug use. Limiting alcohol use. Practicing safe sex. Taking low-dose aspirin every day. Taking vitamin and mineral supplements as recommended by your health care provider. What happens during an annual well check? The services and screenings done by your health care provider during your annual well check will depend on your age, overall health, lifestyle risk factors, and family history of disease. Counseling  Your health care provider may ask you questions about your: Alcohol use. Tobacco use. Drug use. Emotional well-being. Home and relationship well-being. Sexual activity. Eating habits. History of falls. Memory and ability to understand (cognition). Work and work Astronomer. Reproductive health. Screening  You may have the following tests or measurements: Height, weight, and BMI. Blood pressure. Lipid and cholesterol levels. These may be checked every 5 years, or more frequently if you are over 38 years old. Skin check. Lung cancer screening. You may have this screening every year starting at age 34 if you have a 30-pack-year history of smoking and currently smoke or have quit within the past 15 years. Fecal occult blood test (FOBT) of the stool. You may have this test every year starting at  age 14. Flexible sigmoidoscopy or colonoscopy. You may have a sigmoidoscopy every 5 years or a colonoscopy every 10 years starting at age 72. Hepatitis C blood test. Hepatitis B blood test. Sexually transmitted disease (STD) testing. Diabetes screening. This is done by checking your blood sugar (glucose) after you have not eaten for a while (fasting). You may have this done every 1-3 years. Bone density scan. This is done to screen for osteoporosis. You may have this done starting at age 59. Mammogram. This may be done every 1-2 years. Talk to your health care provider about how often you should have regular mammograms. Talk with your health care provider about your test results, treatment options, and if necessary, the need for more tests. Vaccines  Your health care provider may recommend certain vaccines, such as: Influenza vaccine. This is recommended every year. Tetanus, diphtheria, and acellular pertussis (Tdap, Td) vaccine. You may  need a Td booster every 10 years. Zoster vaccine. You may need this after age 81. Pneumococcal 13-valent conjugate (PCV13) vaccine. One dose is recommended after age 81. Pneumococcal polysaccharide (PPSV23) vaccine. One dose is recommended after age 81. Talk to your health care provider about which screenings and vaccines you need and how often you need them. This information is not intended to replace advice given to you by your health care provider. Make sure you discuss any questions you have with your health care provider. Document Released: 06/01/2015 Document Revised: 01/23/2016 Document Reviewed: 03/06/2015 Elsevier Interactive Patient Education  2017 [employer]  Fall Prevention in the Home Falls can cause injuries. They can happen to people of all ages. There are many things you can do to make your home safe and to help prevent falls. What can I do on the outside of my home? Regularly fix the edges of walkways and driveways and fix any  cracks. Remove anything that might make you trip as you walk through a door, such as a raised step or threshold. Trim any bushes or trees on the path to your home. Use bright outdoor lighting. Clear any walking paths of anything that might make someone trip, such as rocks or tools. Regularly check to see if handrails are loose or broken. Make sure that both sides of any steps have handrails. Any raised decks and porches should have guardrails on the edges. Have any leaves, snow, or ice cleared regularly. Use sand or salt on walking paths during winter. Clean up any spills in your garage right away. This includes oil or grease spills. What can I do in the bathroom? Use night lights. Install grab bars by the toilet and in the tub and shower. Do not use towel bars as grab bars. Use non-skid mats or decals in the tub or shower. If you need to sit down in the shower, use a plastic, non-slip stool. Keep the floor dry. Clean up any water that spills on the floor as soon as it happens. Remove soap buildup in the tub or shower regularly. Attach bath mats securely with double-sided non-slip rug tape. Do not have throw rugs and other things on the floor that can make you trip. What can I do in the bedroom? Use night lights. Make sure that you have a light by your bed that is easy to reach. Do not use any sheets or blankets that are too big for your bed. They should not hang down onto the floor. Have a firm chair that has side arms. You can use this for support while you get dressed. Do not have throw rugs and other things on the floor that can make you trip. What can I do in the kitchen? Clean up any spills right away. Avoid walking on wet floors. Keep items that you use a lot in easy-to-reach places. If you need to reach something above you, use a strong step stool that has a grab bar. Keep electrical cords out of the way. Do not use floor polish or wax that makes floors slippery. If you must  use wax, use non-skid floor wax. Do not have throw rugs and other things on the floor that can make you trip. What can I do with my stairs? Do not leave any items on the stairs. Make sure that there are handrails on both sides of the stairs and use them. Fix handrails that are broken or loose. Make sure that handrails are as long as the stairways.  Check any carpeting to make sure that it is firmly attached to the stairs. Fix any carpet that is loose or worn. Avoid having throw rugs at the top or bottom of the stairs. If you do have throw rugs, attach them to the floor with carpet tape. Make sure that you have a light switch at the top of the stairs and the bottom of the stairs. If you do not have them, ask someone to add them for you. What else can I do to help prevent falls? Wear shoes that: Do not have high heels. Have rubber bottoms. Are comfortable and fit you well. Are closed at the toe. Do not wear sandals. If you use a stepladder: Make sure that it is fully opened. Do not climb a closed stepladder. Make sure that both sides of the stepladder are locked into place. Ask someone to hold it for you, if possible. Clearly mark and make sure that you can see: Any grab bars or handrails. First and last steps. Where the edge of each step is. Use tools that help you move around (mobility aids) if they are needed. These include: Canes. Walkers. Scooters. Crutches. Turn on the lights when you go into a dark area. Replace any light bulbs as soon as they burn out. Set up your furniture so you have a clear path. Avoid moving your furniture around. If any of your floors are uneven, fix them. If there are any pets around you, be aware of where they are. Review your medicines with your doctor. Some medicines can make you feel dizzy. This can increase your chance of falling. Ask your doctor what other things that you can do to help prevent falls. This information is not intended to replace  advice given to you by your health care provider. Make sure you discuss any questions you have with your health care provider. Document Released: 03/01/2009 Document Revised: 10/11/2015 Document Reviewed: 06/09/2014 Elsevier Interactive Patient Education  2017 [employer].

## 2022-08-27 NOTE — Telephone Encounter (Signed)
Patient seen for AWV and would like to know if she can be referred to Dermatology for a rash on her face,neck and ears that she says she has been prescribed Triamcinolone cream for in the past but rash continues to recur.  Also would like to know if there is any alternative or additional medication that she can be prescribed to help with postherpetic neuralgia.

## 2022-08-28 NOTE — Telephone Encounter (Signed)
Please have patient make a separate appointment and come in and we can discuss alternative options for neuralgia and get a dermatology referral going for

## 2022-08-28 NOTE — Telephone Encounter (Signed)
Appt scheduled for 4/24 at 4:10

## 2022-09-04 ENCOUNTER — Other Ambulatory Visit: Payer: Self-pay | Admitting: Family Medicine

## 2022-09-04 DIAGNOSIS — N1832 Chronic kidney disease, stage 3b: Secondary | ICD-10-CM

## 2022-09-04 DIAGNOSIS — I10 Essential (primary) hypertension: Secondary | ICD-10-CM

## 2022-09-10 ENCOUNTER — Encounter: Payer: Self-pay | Admitting: Cardiology

## 2022-09-10 ENCOUNTER — Ambulatory Visit: Payer: Medicare Other | Attending: Cardiology | Admitting: Cardiology

## 2022-09-10 ENCOUNTER — Ambulatory Visit: Payer: Medicare Other | Admitting: Family Medicine

## 2022-09-10 VITALS — BP 140/70 | HR 68 | Ht 61.0 in | Wt 225.0 lb

## 2022-09-10 DIAGNOSIS — I10 Essential (primary) hypertension: Secondary | ICD-10-CM | POA: Diagnosis not present

## 2022-09-10 DIAGNOSIS — I2510 Atherosclerotic heart disease of native coronary artery without angina pectoris: Secondary | ICD-10-CM | POA: Diagnosis not present

## 2022-09-10 DIAGNOSIS — E782 Mixed hyperlipidemia: Secondary | ICD-10-CM | POA: Diagnosis not present

## 2022-09-10 MED ORDER — ROSUVASTATIN CALCIUM 40 MG PO TABS: 40.0000 mg | ORAL_TABLET | Freq: Every day | ORAL | 3 refills | Status: DC

## 2022-09-10 NOTE — Patient Instructions (Addendum)
Medication Instructions:  Increase Crestor 40 mg daily as directed  Labwork: NONE ordered at this time of appointment    Testing/Procedures: NONE ordered at this time of appointment    Follow-Up: Your physician recommends that you schedule a follow-up appointment in: 6 Months   Any Other Special Instructions Will Be Listed Below (If Applicable).  If you need a refill on your cardiac medications before your next appointment, please call your pharmacy.

## 2022-09-10 NOTE — Progress Notes (Signed)
Clinical Summary Shelly [name] is a 82 y.o.female seen today for follow up of the following medical problems  1. CAD - history of NSTEMI, DES x 2 to RCA 08/2013 - she is on plavix for secondary prevention due to bleeding on ASA per Dr Junius Argyle notes   - no chest pains, no SOB/DOE - compliant with meds     2. Hyperlipidemia [name] aches on atorvastatin, has tolerated crestor 5mg    07/2020 TC 186 TG 113 HDL 48 LDL 118 01/2022 RC 155 TG 99 HDL 46 LDL 91 - has been able to go up on crestor, now on 20mg  daily.   06/2022 TC 148 TG 91 HDL 49 LDL 82 High risk patient prior MI, age >73, HTN, CKD. Goal LDL would be <55   3. HTN - norvasc caused LE edema, though retried during 04/24/20 appt with PA Vincenza Hews - occasional leg swelling though infrequent   - 08/20/22 appt with renal high bp's. Norvasc and hydralazine were started, she states has not picked up or started this medications yet    4. Postherpetic neuralgia   5. CKD 3 - follwed by Dr Wolfgang Phoenix     6. 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 s/p NSTEMI in 2015 with DES x2 to RCA   Cataract    Chronic kidney disease    Coronary artery disease    SEVERE   GERD (gastroesophageal reflux disease)    GI bleed 12/2013   Goiter    Hyperlipidemia    Hypertension    Myocardial infarct    Myocardial infarction 08/2013   Neuralgia of right lower extremity 07/13/2017   Pancolonic diverticulosis 12/2013   Post herpetic neuralgia      Allergies  Allergen Reactions   Nsaids Other (See Comments)    AVOID all NSAID due to history of GI bleeding   Atorvastatin Other (See  Comments)    Joint & muscle pain     Current Outpatient Medications  Medication Sig Dispense Refill   amLODipine (NORVASC) 5 MG tablet TAKE ONE TABLET BY MOUTH ONCE DAILY 90 tablet 3   cetirizine (ZYRTEC) 10 MG tablet Take 10 mg by mouth daily.     chlorthalidone (HYGROTON) 25 MG tablet TAKE ONE TABLET BY MOUTH ONCE DAILY 90 tablet 0   clopidogrel (PLAVIX) 75 MG tablet Take 1 tablet (75 mg total) by mouth daily. 30 tablet 1   diclofenac Sodium (VOLTAREN) 1 % GEL Apply 2 g topically 4 (four) times daily. 350 g 3   furosemide (LASIX) 40 MG tablet Take 40 mg by mouth daily as needed (weight gain).     gabapentin (NEURONTIN) 100 MG capsule Take 1 capsule (100 mg total) by mouth 3 (three) times daily. 270 capsule 3   hydrALAZINE (APRESOLINE) 50 MG tablet Take by mouth.     hydrocortisone (ANUSOL-HC) 2.5 % rectal cream Place 1 Application rectally 2 (two) times daily. As needed for rectal discomfort. 30 g 1   lisinopril (ZESTRIL) 40 MG tablet  Take 1 tablet (40 mg total) by mouth daily. 90 tablet 3   metoprolol succinate (TOPROL-XL) 50 MG 24 hr tablet TAKE ONE TABLET BY MOUTH EVERY DAY IMMEDIATELY FOLLOWING A MEAL DAILY 90 tablet 3   montelukast (SINGULAIR) 10 MG tablet TAKE ONE TABLET BY MOUTH AT BEDTIME 90 tablet 1   Multiple Vitamin (MULTIVITAMIN) tablet Take 1 tablet by mouth daily.     pantoprazole (PROTONIX) 40 MG tablet Take 1 tablet (40 mg total) by mouth as needed. 30 minutes before breakfast 90 tablet 3   rosuvastatin (CRESTOR) 40 MG tablet Take 1 tablet (40 mg total) by mouth daily. 90 tablet 3   tizanidine (ZANAFLEX) 2 MG capsule Take 1 capsule (2 mg total) by mouth at bedtime as needed for muscle spasms. 30 capsule 1   triamcinolone (KENALOG) 0.025 % ointment Apply 1 application topically 2 (two) times daily. 60 g 1   No current facility-administered medications for this visit.     Past Surgical History:  Procedure Laterality Date   CARPAL TUNNEL RELEASE Bilateral    COLONOSCOPY  N/A 01/13/2014   Dr. Hung:pandiverticulosis    COLONOSCOPY N/A 12/14/2015   Dr. Bosie Clos: pancolonic diverticulosis, internal hemorrhoids    CORNEAL TRANSPLANT     CORONARY ANGIOPLASTY  08/2013   ESOPHAGOGASTRODUODENOSCOPY N/A 12/14/2015   Dr. Bosie Clos: normal    GIVENS CAPSULE STUDY N/A 08/13/2016   Dr. Darrick Penna: enteritis due to aspirin.    HEEL SPUR EXCISION     LEFT HEART CATHETERIZATION WITH CORONARY ANGIOGRAM N/A 08/23/2013   Procedure: LEFT HEART CATHETERIZATION WITH CORONARY ANGIOGRAM;  Surgeon: [name], MD;  Location[facility] CATH LAB;  Service: Cardiovascular;  Laterality: N/A;   PERCUTANEOUS CORONARY STENT INTERVENTION (PCI-S) N/A 08/24/2013   Procedure: PERCUTANEOUS CORONARY STENT INTERVENTION (PCI-S);  Surgeon: [name], MD;  Location: [facility] CATH LAB;  Service: Cardiovascular;  Laterality: N/A;   TONSILLECTOMY     TUBAL LIGATION       Allergies  Allergen Reactions   Nsaids Other (See Comments)    AVOID all NSAID due to history of GI bleeding   Atorvastatin Other (See Comments)    Joint & muscle pain      Family History  Problem Relation Age of Onset   Hypertension Brother    Transient ischemic attack Brother    Cancer Mother        unsure of type    Other Father        grangrene    Asthma Sister    Stroke Sister    COPD Sister    Stroke Sister    Early death Brother    Early death Brother    Liver cancer Daughter    Cancer Daughter    Hernia Son        Umbilical   GI problems Son    Pancreatic disease Daughter        pancreatectomy   Cancer Daughter    Diverticulitis Daughter    Arthritis Daughter        knee    Arthritis Daughter        shoulder    Anemia Daughter    GER disease Son    Colon cancer Neg Hx    Colon polyps Neg Hx      Social History Ms. Bonillas reports that she has never smoked. She has never used smokeless tobacco. Ms. Davidovich reports no history of alcohol use.   Review of Systems CONSTITUTIONAL: No weight loss, fever,  chills,  weakness or fatigue.  HEENT: Eyes: No visual loss, blurred vision, double vision or yellow sclerae.No hearing loss, sneezing, congestion, runny nose or sore throat.  SKIN: No rash or itching.  CARDIOVASCULAR: per hpi RESPIRATORY: No shortness of breath, cough or sputum.  GASTROINTESTINAL: No anorexia, nausea, vomiting or diarrhea. No abdominal pain or blood.  GENITOURINARY: No burning on urination, no polyuria NEUROLOGICAL: No headache, dizziness, syncope, paralysis, ataxia, numbness or tingling in the extremities. No change in bowel or bladder control.  MUSCULOSKELETAL: No muscle, back pain, joint pain or stiffness.  LYMPHATICS: No enlarged nodes. No history of splenectomy.  PSYCHIATRIC: No history of depression or anxiety.  ENDOCRINOLOGIC: No reports of sweating, cold or heat intolerance. No polyuria or polydipsia.  Marland Kitchen   Physical Examination Vitals:   09/10/22 1617 09/10/22 1649  BP: (!) 140/70 (!) 140/70  Pulse:    SpO2:     Filed Weights   09/10/22 1601  Weight: 225 lb (102.1 kg)    Gen: resting comfortably, no acute distress HEENT: no scleral icterus, pupils equal round and reactive, no palptable cervical adenopathy,  CV: RRR, no mrg, no jvd Resp: Clear to auscultation bilaterally GI: abdomen is soft, non-tender, non-distended, normal bowel sounds, no hepatosplenomegaly MSK: extremities are warm, no edema.  Skin: warm, no rash Neuro:  no focal deficits Psych: appropriate affect   Diagnostic Studies     Assessment and Plan  1. CAD - no recent symptoms, continue current meds   2. Hyperlipidemia - goal LDL would be at least < 70, could argue <55. Would increase her crestor to  daily.    3. HTN -elevated but has not started the new medications nephrology had recommended with norvasc and hydralazine. Asked to take all pill bottles with her to pcp to clarify what she has and does not have, likely will need at least one additional bp med      Antoine Poche, M.D.,

## 2022-09-11 ENCOUNTER — Ambulatory Visit (INDEPENDENT_AMBULATORY_CARE_PROVIDER_SITE_OTHER): Payer: Medicare Other | Admitting: Family Medicine

## 2022-09-11 ENCOUNTER — Encounter: Payer: Self-pay | Admitting: Family Medicine

## 2022-09-11 VITALS — BP 136/62 | HR 70 | Ht 61.0 in | Wt 223.0 lb

## 2022-09-11 DIAGNOSIS — B0229 Other postherpetic nervous system involvement: Secondary | ICD-10-CM

## 2022-09-11 MED ORDER — AMITRIPTYLINE HCL 25 MG PO TABS
25.0000 mg | ORAL_TABLET | Freq: Every day | ORAL | 3 refills | Status: DC
Start: 2022-09-11 — End: 2023-07-31

## 2022-09-11 NOTE — Progress Notes (Signed)
BP 136/62   Pulse 70   Ht  (1.549 m)   Wt 223 lb (101.2 kg)   SpO2 98%   BMI 42.14 kg/m    Subjective:   Patient ID: Shelly [name], female    DOB: September 15, 1940, 82 y.o.   MRN: [id number]  HPI: Shelly [name] is a 82 y.o. female presenting on 09/11/2022 for Shoulder Pain (right)   HPI Right posterior shoulder pain and neuropathy and postherpetic neuralgia Patient continues to have right posterior shoulder pain from her shingles that she had many years ago.  She does take gabapentin for and says that it takes the edge off but is still very bothersome for her.  She had tried to increase the gabapentin in the past but said it was too overly sedating and she is coming in today just because she wants to see what other options she has.  She has taken Zanaflex in the evenings and it does help her sleep so it does not bother her at nighttime but during the daytime the burning and tingling pain on her posterior upper back below her scapula is where it is the most bothersome.  Relevant past medical, surgical, family and social history reviewed and updated as indicated. Interim medical history since our last visit reviewed. Allergies and medications reviewed and updated.  Review of Systems  Constitutional:  Negative for chills and fever.  Eyes:  Negative for visual disturbance.  Respiratory:  Negative for chest tightness and shortness of breath.   Cardiovascular:  Negative for chest pain and leg swelling.  Musculoskeletal:  Positive for arthralgias, back pain and myalgias. Negative for gait problem.  Skin:  Negative for rash.  Neurological:  Negative for light-headedness and headaches.  Psychiatric/Behavioral:  Negative for agitation and behavioral problems.   All other systems reviewed and are negative.   Per HPI unless specifically indicated above   Allergies as of 09/11/2022       Reactions   Nsaids Other (See Comments)   AVOID all NSAID due to history of GI bleeding    Atorvastatin Other (See Comments)   Joint & muscle pain        Medication List        Accurate as of September 11, 2022  1:38 PM. If you have any questions, ask your nurse or doctor.          amitriptyline 25 MG tablet Commonly known as: ELAVIL Take 1 tablet (25 mg total) by mouth at bedtime. Started by: Elige Radon Yexalen Deike, MD   amLODipine 5 MG tablet Commonly known as: NORVASC TAKE ONE TABLET BY MOUTH ONCE DAILY   cetirizine 10 MG tablet Commonly known as: ZYRTEC Take 10 mg by mouth daily.   chlorthalidone 25 MG tablet Commonly known as: HYGROTON TAKE ONE TABLET BY MOUTH ONCE DAILY   clopidogrel 75 MG tablet Commonly known as: PLAVIX Take 1 tablet (75 mg total) by mouth daily.   diclofenac Sodium 1 % Gel Commonly known as: Voltaren Apply 2 g topically 4 (four) times daily.   furosemide 40 MG tablet Commonly known as: LASIX Take 40 mg by mouth daily as needed (weight gain).   gabapentin 100 MG capsule Commonly known as: NEURONTIN Take 1 capsule (100 mg total) by mouth 3 (three) times daily.   hydrALAZINE 50 MG tablet Commonly known as: APRESOLINE Take by mouth.   hydrocortisone 2.5 % rectal cream Commonly known as: ANUSOL-HC Place 1 Application rectally 2 (two) times daily. As needed for  rectal discomfort.   lisinopril 40 MG tablet Commonly known as: ZESTRIL Take 1 tablet (40 mg total) by mouth daily.   metoprolol succinate 50 MG 24 hr tablet Commonly known as: TOPROL-XL TAKE ONE TABLET BY MOUTH EVERY DAY IMMEDIATELY FOLLOWING A MEAL DAILY   montelukast 10 MG tablet Commonly known as: SINGULAIR TAKE ONE TABLET BY MOUTH AT BEDTIME   multivitamin tablet Take 1 tablet by mouth daily.   pantoprazole 40 MG tablet Commonly known as: PROTONIX Take 1 tablet (40 mg total) by mouth as needed. 30 minutes before breakfast   rosuvastatin 40 MG tablet Commonly known as: CRESTOR Take 1 tablet (40 mg total) by mouth daily.   tizanidine 2 MG  capsule Commonly known as: ZANAFLEX Take 1 capsule (2 mg total) by mouth at bedtime as needed for muscle spasms.   triamcinolone 0.025 % ointment Commonly known as: KENALOG Apply 1 application topically 2 (two) times daily.         Objective:   BP 136/62   Pulse 70   Ht  (1.549 m)   Wt 223 lb (101.2 kg)   SpO2 98%   BMI 42.14 kg/m   Wt Readings from Last 3 Encounters:  09/11/22 223 lb (101.2 kg)  09/10/22 225 lb (102.1 kg)  08/27/22 223 lb (101.2 kg)    Physical Exam Vitals and nursing note reviewed.  Constitutional:      General: She is not in acute distress.    Appearance: She is well-developed. She is not diaphoretic.  Eyes:     Conjunctiva/sclera: Conjunctivae normal.  Musculoskeletal:        General: No tenderness. Normal range of motion.       Arms:  Skin:    General: Skin is warm and dry.     Findings: No rash.  Neurological:     Mental Status: She is alert and oriented to person, place, and time.     Coordination: Coordination normal.  Psychiatric:        Behavior: Behavior normal.       Assessment & Plan:   Problem List Items Addressed This Visit       Nervous and Auditory   Postherpetic neuralgia - Primary   Relevant Medications   amitriptyline (ELAVIL) 25 MG tablet  Will try adding amitriptyline at nighttime and see if that helps reduce some of the neuropathic pain.  She has tried in the past increasing gabapentin, she is also tried Lyrica and Cymbalta and topical lidocaine and topical Voltaren.  She cannot take any oral NSAIDs.  Follow up plan: Return if symptoms worsen or fail to improve.  Counseling provided for all of the vaccine components No orders of the defined types were placed in this encounter.   Arville Care, MD [facility] 09/11/2022, 1:38 PM

## 2022-09-15 ENCOUNTER — Telehealth: Payer: Self-pay | Admitting: Family Medicine

## 2022-09-15 NOTE — Telephone Encounter (Signed)
Okay sounds good, yes we have been trying to ask that question before and thought that she might be doubling up on it but have her take Crestor 40 and let us know if it improves

## 2022-09-15 NOTE — Telephone Encounter (Signed)
Pt says that she thinks these rx are causing some side effects--montelukast (SINGULAIR) 10 MG tablet  amitriptyline (ELAVIL) 25 MG tablet  hydrALAZINE (APRESOLINE) 50 MG tablet  Side effects are head thumbing gums did not feel right neck and chest thumbing like tight. It started Sat night when pt took rx. Pt says that she was fine all day Sunday. But when she took her rx on Sunday night it happened again. The side effects woke up pt at night and she noticed that she was sweating too. Today she does not feel really good and has a light headache. Please call back

## 2022-09-15 NOTE — Telephone Encounter (Signed)
Patient has recently had some episodes where she felt dizzy and lightheaded and had a slight headache.  She has just realized today that she has been taking two of the Rosuvastatin daily, one for 20 mg and one for 40 mg.  She is going to stop taking the 20 mg and will let us now if her symptoms do not improve.

## 2022-09-15 NOTE — Telephone Encounter (Signed)
Pt made aware and understood.   She will take 40mg  Crestor once daily and throw out the 20mg .

## 2022-09-25 ENCOUNTER — Ambulatory Visit: Payer: Medicare Other | Admitting: Gastroenterology

## 2022-09-25 ENCOUNTER — Telehealth: Payer: Self-pay | Admitting: *Deleted

## 2022-09-25 ENCOUNTER — Other Ambulatory Visit (HOSPITAL_COMMUNITY): Payer: Self-pay

## 2022-09-25 MED ORDER — NITROGLYCERIN 0.4 MG SL SUBL
0.4000 mg | SUBLINGUAL_TABLET | SUBLINGUAL | 3 refills | Status: DC | PRN
  Filled 2022-09-25: qty 25, 8d supply, fill #0

## 2022-09-25 NOTE — Telephone Encounter (Signed)
Contacted patient and gave options to switch to [facility] for nitro refill and to have all prescriptions transferred to [facility]. Patient agreed to pharmacy switch. Gave number for [facility].

## 2022-10-04 ENCOUNTER — Other Ambulatory Visit (HOSPITAL_COMMUNITY): Payer: Self-pay

## 2022-10-15 ENCOUNTER — Ambulatory Visit (INDEPENDENT_AMBULATORY_CARE_PROVIDER_SITE_OTHER): Payer: Medicare Other | Admitting: Family Medicine

## 2022-10-15 ENCOUNTER — Encounter: Payer: Self-pay | Admitting: Family Medicine

## 2022-10-15 VITALS — BP 135/74 | HR 76 | Ht 61.0 in | Wt 223.0 lb

## 2022-10-15 DIAGNOSIS — N183 Chronic kidney disease, stage 3 unspecified: Secondary | ICD-10-CM | POA: Diagnosis not present

## 2022-10-15 DIAGNOSIS — N1832 Chronic kidney disease, stage 3b: Secondary | ICD-10-CM

## 2022-10-15 DIAGNOSIS — E782 Mixed hyperlipidemia: Secondary | ICD-10-CM

## 2022-10-15 DIAGNOSIS — I10 Essential (primary) hypertension: Secondary | ICD-10-CM

## 2022-10-15 DIAGNOSIS — E0590 Thyrotoxicosis, unspecified without thyrotoxic crisis or storm: Secondary | ICD-10-CM | POA: Diagnosis not present

## 2022-10-15 DIAGNOSIS — E1122 Type 2 diabetes mellitus with diabetic chronic kidney disease: Secondary | ICD-10-CM | POA: Diagnosis not present

## 2022-10-15 LAB — CBC WITH DIFFERENTIAL/PLATELET
Basophils Absolute: 0 10*3/uL (ref 0.0–0.2)
Hemoglobin: 10.2 g/dL — ABNORMAL LOW (ref 11.1–15.9)
Immature Grans (Abs): 0 10*3/uL (ref 0.0–0.1)
Lymphocytes Absolute: 2 10*3/uL (ref 0.7–3.1)
MCV: 92 fL (ref 79–97)
Monocytes: 9 %
Neutrophils: 55 %

## 2022-10-15 LAB — LIPID PANEL

## 2022-10-15 LAB — BAYER DCA HB A1C WAIVED: HB A1C (BAYER DCA - WAIVED): 6.4 % — ABNORMAL HIGH (ref 4.8–5.6)

## 2022-10-15 NOTE — Progress Notes (Signed)
BP 135/74   Pulse 76   Ht 5\' 1"  (1.549 m)   Wt 223 lb (101.2 kg)   SpO2 94%   BMI 42.14 kg/m    Subjective:   Patient ID: Shelly [name], female    DOB: December 23, 1940, 82 y.o.   MRN: [id number]  HPI: Shelly [name] is a 82 y.o. female presenting on 10/15/2022 for Medical Management of Chronic Issues and Chronic Kidney Disease   HPI Hypertension Patient is currently on chlorthalidone and metoprolol, nephrologist gave him hydralazine but she has not taken it, and their blood pressure today is 135/78. Patient denies any lightheadedness or dizziness. Patient denies headaches, blurred vision, chest pains, shortness of breath, or weakness. Denies any side effects from medication and is content with current medication.   Hyperlipidemia Patient is coming in for recheck of his hyperlipidemia. The patient is currently taking Crestor. They deny any issues with myalgias or history of liver damage from it. They deny any focal numbness or weakness or chest pain.   Hypothyroidism recheck Patient is coming in for thyroid recheck today as well. They deny any issues with hair changes or heat or cold problems or diarrhea or constipation. They deny any chest pain or palpitations. They are currently on no medicine currently, monitoring  Type 2 diabetes mellitus Patient comes in today for recheck of his diabetes. Patient has been currently taking no medicine currently, diet. Patient is not currently on an ACE inhibitor/ARB. Patient has not seen an ophthalmologist this year. Patient denies any new issues with their feet. The symptom started onset as an adult hyperlipidemia and hypertension and hypothyroidism and CAD ARE RELATED TO DM   Relevant past medical, surgical, family and social history reviewed and updated as indicated. Interim medical history since our last visit reviewed. Allergies and medications reviewed and updated.  Review of Systems  Constitutional:  Negative for chills and fever.   Eyes:  Negative for redness and visual disturbance.  Respiratory:  Negative for chest tightness and shortness of breath.   Cardiovascular:  Negative for chest pain and leg swelling.  Musculoskeletal:  Negative for back pain and gait problem.  Skin:  Negative for rash.  Neurological:  Negative for light-headedness and headaches.  Psychiatric/Behavioral:  Negative for agitation and behavioral problems.   All other systems reviewed and are negative.   Per HPI unless specifically indicated above   Allergies as of 10/15/2022       Reactions   Nsaids Other (See Comments)   AVOID all NSAID due to history of GI bleeding   Atorvastatin Other (See Comments)   Joint & muscle pain        Medication List        Accurate as of Oct 15, 2022  3:02 PM. If you have any questions, ask your nurse or doctor.          STOP taking these medications    amLODipine 5 MG tablet Commonly known as: NORVASC Stopped by: Nils Pyle, MD   furosemide 40 MG tablet Commonly known as: LASIX Stopped by: Nils Pyle, MD   lisinopril 40 MG tablet Commonly known as: ZESTRIL Stopped by: Nils Pyle, MD   pantoprazole 40 MG tablet Commonly known as: PROTONIX Stopped by: Nils Pyle, MD   tizanidine 2 MG capsule Commonly known as: ZANAFLEX Stopped by: Elige Radon Harjas Biggins, MD   triamcinolone 0.025 % ointment Commonly known as: KENALOG Stopped by: Nils Pyle, MD  TAKE these medications    amitriptyline 25 MG tablet Commonly known as: ELAVIL Take 1 tablet (25 mg total) by mouth at bedtime.   cetirizine 10 MG tablet Commonly known as: ZYRTEC Take 10 mg by mouth daily.   chlorthalidone 25 MG tablet Commonly known as: HYGROTON TAKE ONE TABLET BY MOUTH ONCE DAILY   clopidogrel 75 MG tablet Commonly known as: PLAVIX Take 1 tablet (75 mg total) by mouth daily.   diclofenac Sodium 1 % Gel Commonly known as: Voltaren Apply 2 g topically 4 (four)  times daily.   gabapentin 100 MG capsule Commonly known as: NEURONTIN Take 1 capsule (100 mg total) by mouth 3 (three) times daily.   hydrALAZINE 50 MG tablet Commonly known as: APRESOLINE Take by mouth.   hydrocortisone 2.5 % rectal cream Commonly known as: ANUSOL-HC Place 1 Application rectally 2 (two) times daily. As needed for rectal discomfort.   metoprolol succinate 50 MG 24 hr tablet Commonly known as: TOPROL-XL TAKE ONE TABLET BY MOUTH EVERY DAY IMMEDIATELY FOLLOWING A MEAL DAILY   montelukast 10 MG tablet Commonly known as: SINGULAIR TAKE ONE TABLET BY MOUTH AT BEDTIME   multivitamin tablet Take 1 tablet by mouth daily.   nitroGLYCERIN 0.4 MG SL tablet Commonly known as: NITROSTAT Place 1 tablet under the tongue every 5 minutes x 3 doses as needed for chest pain (if no relief after 3rd dose, proceed to ED or call 911).   rosuvastatin 40 MG tablet Commonly known as: CRESTOR Take 1 tablet (40 mg total) by mouth daily.         Objective:   BP 135/74   Pulse 76   Ht 5\' 1"  (1.549 m)   Wt 223 lb (101.2 kg)   SpO2 94%   BMI 42.14 kg/m   Wt Readings from Last 3 Encounters:  10/15/22 223 lb (101.2 kg)  09/11/22 223 lb (101.2 kg)  09/10/22 225 lb (102.1 kg)    Physical Exam Vitals and nursing note reviewed.  Constitutional:      General: She is not in acute distress.    Appearance: She is well-developed. She is not diaphoretic.  Eyes:     Conjunctiva/sclera: Conjunctivae normal.  Cardiovascular:     Rate and Rhythm: Normal rate and regular rhythm.     Heart sounds: Normal heart sounds. No murmur heard. Pulmonary:     Effort: Pulmonary effort is normal. No respiratory distress.     Breath sounds: Normal breath sounds. No wheezing.  Musculoskeletal:        General: No swelling or tenderness. Normal range of motion.  Skin:    General: Skin is warm and dry.     Findings: No rash.  Neurological:     Mental Status: She is alert and oriented to person,  place, and time.     Coordination: Coordination normal.  Psychiatric:        Behavior: Behavior normal.       Assessment & Plan:   Problem List Items Addressed This Visit       Cardiovascular and Mediastinum   Essential hypertension   Relevant Orders   CMP14+EGFR   Lipid panel   CBC with Differential/Platelet   Bayer DCA Hb A1c Waived     Endocrine   Subclinical hyperthyroidism   Type 2 diabetes mellitus with diabetic chronic kidney disease (HCC)     Genitourinary   CKD (chronic kidney disease), stage III (HCC) - Primary   Relevant Orders   CMP14+EGFR   Lipid  panel   CBC with Differential/Platelet   Bayer DCA Hb A1c Waived   Other Visit Diagnoses     Mixed hyperlipidemia       Relevant Orders   CMP14+EGFR   Lipid panel   CBC with Differential/Platelet   Bayer DCA Hb A1c Waived     Seems to be doing well with blood pressure today and has not started the hydralazine so recommended that she hold off on starting that. A1c was 6.4 today so no change with those medicines either.  Patient was concerned about the amitriptyline and do not see any major interaction with any of her medicines so recommended that she could continue forward with it.  Follow up plan: Return in about 3 months (around 01/15/2023), or if symptoms worsen or fail to improve, for Diabetes and hypertension and hyperlipidemia and hypothyroidism.  Counseling provided for all of the vaccine components Orders Placed This Encounter  Procedures   CMP14+EGFR   Lipid panel   CBC with Differential/Platelet   Bayer DCA Hb A1c Waived    Arville Care, MD [facility] 10/15/2022, 3:02 PM

## 2022-10-16 ENCOUNTER — Encounter: Payer: Self-pay | Admitting: Gastroenterology

## 2022-10-16 ENCOUNTER — Ambulatory Visit (INDEPENDENT_AMBULATORY_CARE_PROVIDER_SITE_OTHER): Payer: Medicare Other | Admitting: Gastroenterology

## 2022-10-16 VITALS — BP 156/70 | HR 65 | Temp 98.2°F | Ht 61.0 in | Wt 220.1 lb

## 2022-10-16 DIAGNOSIS — K219 Gastro-esophageal reflux disease without esophagitis: Secondary | ICD-10-CM | POA: Diagnosis not present

## 2022-10-16 DIAGNOSIS — R634 Abnormal weight loss: Secondary | ICD-10-CM | POA: Diagnosis not present

## 2022-10-16 LAB — CMP14+EGFR
ALT: 8 IU/L (ref 0–32)
AST: 14 IU/L (ref 0–40)
Albumin/Globulin Ratio: 1.1 — ABNORMAL LOW (ref 1.2–2.2)
Albumin: 3.8 g/dL (ref 3.7–4.7)
Alkaline Phosphatase: 92 IU/L (ref 44–121)
BUN/Creatinine Ratio: 19 (ref 12–28)
BUN: 36 mg/dL — ABNORMAL HIGH (ref 8–27)
Bilirubin Total: 0.2 mg/dL (ref 0.0–1.2)
CO2: 20 mmol/L (ref 20–29)
Calcium: 9.8 mg/dL (ref 8.7–10.3)
Chloride: 107 mmol/L — ABNORMAL HIGH (ref 96–106)
Creatinine, Ser: 1.93 mg/dL — ABNORMAL HIGH (ref 0.57–1.00)
Globulin, Total: 3.4 g/dL (ref 1.5–4.5)
Glucose: 122 mg/dL — ABNORMAL HIGH (ref 70–99)
Potassium: 4.3 mmol/L (ref 3.5–5.2)
Sodium: 141 mmol/L (ref 134–144)
Total Protein: 7.2 g/dL (ref 6.0–8.5)
eGFR: 26 mL/min/{1.73_m2} — ABNORMAL LOW (ref >59)

## 2022-10-16 LAB — CBC WITH DIFFERENTIAL/PLATELET
Basos: 1 %
EOS (ABSOLUTE): 0.1 10*3/uL (ref 0.0–0.4)
Eos: 1 %
Hematocrit: 31.8 % — ABNORMAL LOW (ref 34.0–46.6)
Immature Granulocytes: 0 %
Lymphs: 34 %
MCH: 29.5 pg (ref 26.6–33.0)
MCHC: 32.1 g/dL (ref 31.5–35.7)
Monocytes Absolute: 0.5 10*3/uL (ref 0.1–0.9)
Neutrophils Absolute: 3.3 10*3/uL (ref 1.4–7.0)
Platelets: 284 10*3/uL (ref 150–450)
RBC: 3.46 x10E6/uL — ABNORMAL LOW (ref 3.77–5.28)
RDW: 13.3 % (ref 11.7–15.4)
WBC: 5.9 10*3/uL (ref 3.4–10.8)

## 2022-10-16 LAB — LIPID PANEL
Cholesterol, Total: 151 mg/dL (ref 100–199)
LDL Chol Calc (NIH): 80 mg/dL (ref 0–99)
VLDL Cholesterol Cal: 18 mg/dL (ref 5–40)

## 2022-10-16 NOTE — Patient Instructions (Signed)
I would like to see you back in about 6 weeks to check on your weight!  Have a great summer!  I enjoyed seeing you again today! I value our relationship and want to provide genuine, compassionate, and quality care. You may receive a survey regarding your visit with me, and I welcome your feedback! Thanks so much for taking the time to complete this. I look forward to seeing you again.      Gelene Mink, PhD, ANP-BC [facility]

## 2022-10-16 NOTE — Progress Notes (Signed)
Gastroenterology Office Note     Primary Care Physician:  [name], Elige Radon, MD  Primary Gastroenterologist: Dr. Jena Gauss    Chief Complaint   Chief Complaint  Patient presents with   Follow-up    Follow up on GERD     History of Present Illness   DUSTIE TACKE is an 82 y.o. female presenting today in follow-up with a history of chronic GERD, diverticular bleed many years prior, IDA with capsule study showing enteritis due to aspirin use, and chronic normocytic anemia with chronic disease component.  She prefers frequent visits.    Sometimes small amount of nausea but not enough to take something. She has Zofran on hand if needed. Has not taken this in awhile. GERD symptoms are rare. Will occasionally take pantoprazole just as needed but rare. No dysphagia. No abdominal pain. Good appetite. No early satiety. No overt GI bleeding. No changes in bowel habits.   She is concerned about slow steady weight loss. Historically has been in the mid to high 230s, now 220. She has been around 223 since Feb 2024. I note she is followed for subclinical hyperthyroidism by Dr. Fransico Him. Appt upcoming in July 2024 for recheck labs.   Past Medical History:  Diagnosis Date   Allergy    Anemia    GI bleed   Anginal pain (HCC)    Anxiety    Arthritis    Blood transfusion without reported diagnosis    CAD (coronary artery disease)    a. s/p NSTEMI in 2015 with DES x2 to RCA   Cataract    Chronic kidney disease    Coronary artery disease    SEVERE   GERD (gastroesophageal reflux disease)    GI bleed 12/2013   Goiter    Hyperlipidemia    Hypertension    Myocardial infarct [facility])    Myocardial infarction (HCC) 08/2013   Neuralgia of right lower extremity 07/13/2017   Pancolonic diverticulosis 12/2013   Post herpetic neuralgia     Past Surgical History:  Procedure Laterality Date   CARPAL TUNNEL RELEASE Bilateral    COLONOSCOPY N/A 01/13/2014   Dr. Hung:pandiverticulosis     COLONOSCOPY N/A 12/14/2015   Dr. Bosie Clos: pancolonic diverticulosis, internal hemorrhoids    CORNEAL TRANSPLANT     CORONARY ANGIOPLASTY  08/2013   ESOPHAGOGASTRODUODENOSCOPY N/A 12/14/2015   Dr. Bosie Clos: normal    GIVENS CAPSULE STUDY N/A 08/13/2016   Dr. Darrick Penna: enteritis due to aspirin.    HEEL SPUR EXCISION     LEFT HEART CATHETERIZATION WITH CORONARY ANGIOGRAM N/A 08/23/2013   Procedure: LEFT HEART CATHETERIZATION WITH CORONARY ANGIOGRAM;  Surgeon: [name], MD;  Location: [facility] CATH LAB;  Service: Cardiovascular;  Laterality: N/A;   PERCUTANEOUS CORONARY STENT INTERVENTION (PCI-S) N/A 08/24/2013   Procedure: PERCUTANEOUS CORONARY STENT INTERVENTION (PCI-S);  Surgeon: [name], MD;  Location: [facility] CATH LAB;  Service: Cardiovascular;  Laterality: N/A;   TONSILLECTOMY     TUBAL LIGATION      Current Outpatient Medications  Medication Sig Dispense Refill   amitriptyline (ELAVIL) 25 MG tablet Take 1 tablet (25 mg total) by mouth at bedtime. 90 tablet 3   cetirizine (ZYRTEC) 10 MG tablet Take 10 mg by mouth daily.     chlorthalidone (HYGROTON) 25 MG tablet TAKE ONE TABLET BY MOUTH ONCE DAILY 90 tablet 0   clopidogrel (PLAVIX) 75 MG tablet Take 1 tablet (75 mg total) by mouth daily. 30 tablet 1   diclofenac Sodium (VOLTAREN) 1 %  GEL Apply 2 g topically 4 (four) times daily. 350 g 3   gabapentin (NEURONTIN) 100 MG capsule Take 1 capsule (100 mg total) by mouth 3 (three) times daily. 270 capsule 3   hydrALAZINE (APRESOLINE) 50 MG tablet Take by mouth.     hydrocortisone (ANUSOL-HC) 2.5 % rectal cream Place 1 Application rectally 2 (two) times daily. As needed for rectal discomfort. 30 g 1   metoprolol succinate (TOPROL-XL) 50 MG 24 hr tablet TAKE ONE TABLET BY MOUTH EVERY DAY IMMEDIATELY FOLLOWING A MEAL DAILY 90 tablet 3   montelukast (SINGULAIR) 10 MG tablet TAKE ONE TABLET BY MOUTH AT BEDTIME 90 tablet 1   Multiple Vitamin (MULTIVITAMIN) tablet Take 1 tablet by mouth daily.      nitroGLYCERIN (NITROSTAT) 0.4 MG SL tablet Place 1 tablet under the tongue every 5 minutes x 3 doses as needed for chest pain (if no relief after 3rd dose, proceed to ED or call 911). 25 tablet 3   rosuvastatin (CRESTOR) 40 MG tablet Take 1 tablet (40 mg total) by mouth daily. 90 tablet 3   No current facility-administered medications for this visit.    Allergies as of 10/16/2022 - Review Complete 10/16/2022  Allergen Reaction Noted   Nsaids Other (See Comments) 07/25/2016   Atorvastatin Other (See Comments) 11/30/2018    Family History  Problem Relation Age of Onset   Hypertension Brother    Transient ischemic attack Brother    Cancer Mother        unsure of type    Other Father        grangrene    Asthma Sister    Stroke Sister    COPD Sister    Stroke Sister    Early death Brother    Early death Brother    Liver cancer Daughter    Cancer Daughter    Hernia Son        Umbilical   GI problems Son    Pancreatic disease Daughter        pancreatectomy   Cancer Daughter    Diverticulitis Daughter    Arthritis Daughter        knee    Arthritis Daughter        shoulder    Anemia Daughter    GER disease Son    Colon cancer Neg Hx    Colon polyps Neg Hx     Social History   Socioeconomic History   Marital status: Divorced    Spouse name: Not on file   Number of children: 8   Years of education: 11   Highest education level: 11th grade  Occupational History   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   No daily caffeine use.   2 story home but she only has to use one level   Social Determinants of Health   Financial Resource Strain: Low Risk  (08/08/2021)  Overall Financial Resource Strain (CARDIA)    Difficulty of Paying Living Expenses: Not hard at all  Food Insecurity: No Food Insecurity (08/08/2021)   Hunger Vital Sign    Worried About Running Out of Food in the Last Year: Never true    Ran Out of Food in the Last Year: Never true  Transportation Needs: No Transportation Needs (08/08/2021)   PRAPARE - Administrator, Civil Service (Medical): No    Lack of Transportation (Non-Medical): No  Physical Activity: Inactive (12/17/2021)   Exercise Vital Sign    Days of Exercise per Week: 0 days    Minutes of Exercise per Session: 0 min  Stress: Stress Concern Present (12/17/2021)   [employer] of Occupational Health - Occupational Stress Questionnaire    Feeling of Stress : To some extent  Social Connections: Socially Isolated (08/08/2021)   Social Connection and Isolation Panel [NHANES]    Frequency of Communication with Friends and Family: More than three times a week    Frequency of Social Gatherings with Friends and Family: More than three times a week    Attends Religious Services: Never    Database administrator or Organizations: No    Attends Banker Meetings: Never    Marital Status: Widowed  Intimate Partner Violence: Not At Risk (08/08/2021)   Humiliation, Afraid, Rape, and Kick questionnaire    Fear of Current or Ex-Partner: No    Emotionally Abused: No    Physically Abused: No    Sexually Abused: No     Review of Systems   Gen: Denies any fever, chills, fatigue, weight loss, lack of appetite.  CV: Denies chest pain, heart palpitations, peripheral edema, syncope.  Resp: Denies shortness of breath at rest or with exertion. Denies wheezing or cough.  GI: Denies dysphagia or odynophagia.  Denies jaundice, hematemesis, fecal incontinence. GU : Denies urinary burning, urinary frequency, urinary hesitancy MS: Denies joint pain, muscle weakness, cramps, or limitation of movement.  Derm: Denies rash, itching, dry skin Psych: Denies depression, anxiety, memory loss, and confusion Heme: Denies bruising, bleeding, and enlarged lymph nodes.   Physical Exam   BP (!) 156/70   Pulse 65   Temp 98.2 F (36.8 C)   Ht 5\' 1"  (1.549 m)   Wt 220 lb 1.6 oz (99.8 kg)   BMI 41.59 kg/m  General:   Alert and oriented. Pleasant and cooperative. Well-nourished and well-developed.  Head:  Normocephalic and atraumatic. Eyes:  Without icterus Abdomen:  +BS, soft, non-tender and non-distended. No HSM noted. No guarding or rebound. No masses appreciated.  Rectal:  Deferred  Msk:  Symmetrical without gross deformities. Normal posture. Extremities:  Without edema. Neurologic:  Alert and  oriented x4;  grossly normal neurologically. Skin:  Intact without significant lesions or rashes. Psych:  Alert and cooperative. Normal mood and affect.   Assessment   KALASIA ESTIME is an 82 y.o. female presenting today in follow-up with a history of chronic GERD, diverticular bleed many years prior, IDA with capsule study showing enteritis due to aspirin use, and chronic normocytic anemia with chronic disease component.  She prefers to be seen every 3 months.   GERD symptoms are very few and far between; she is taking pantoprazole just as needed.   I do note she has lost weight this year unintentionally but no other alarm signs/symptoms. She does have subclinical hyperthyroidism and followed by Dr. Fransico Him. Upcoming July appt. This could be contributing.   I would  like to see her back after the July appt with Endocrine to ensure weight is stable.      PLAN    Continue PPI just as needed Follow-up in July for weight check    Gelene Mink, PhD, [facility] Gastroenterology

## 2022-10-20 ENCOUNTER — Other Ambulatory Visit: Payer: Self-pay | Admitting: *Deleted

## 2022-10-20 MED ORDER — CLOPIDOGREL BISULFATE 75 MG PO TABS: 75.0000 mg | ORAL_TABLET | Freq: Every day | ORAL | 2 refills | Status: DC

## 2022-10-22 ENCOUNTER — Emergency Department (HOSPITAL_COMMUNITY)
Admission: EM | Admit: 2022-10-22 | Discharge: 2022-10-23 | Disposition: A | Discharge status: Home / Self Care | Payer: Medicare Other | Attending: Emergency Medicine | Admitting: Emergency Medicine

## 2022-10-22 ENCOUNTER — Other Ambulatory Visit: Payer: Self-pay

## 2022-10-22 DIAGNOSIS — S8392XA Sprain of unspecified site of left knee, initial encounter: Secondary | ICD-10-CM | POA: Diagnosis not present

## 2022-10-22 DIAGNOSIS — S60031A Contusion of right middle finger without damage to nail, initial encounter: Secondary | ICD-10-CM | POA: Diagnosis not present

## 2022-10-22 DIAGNOSIS — S8002XA Contusion of left knee, initial encounter: Secondary | ICD-10-CM | POA: Diagnosis not present

## 2022-10-22 DIAGNOSIS — S838X2A Sprain of other specified parts of left knee, initial encounter: Secondary | ICD-10-CM | POA: Diagnosis not present

## 2022-10-22 DIAGNOSIS — Z7902 Long term (current) use of antithrombotics/antiplatelets: Secondary | ICD-10-CM | POA: Diagnosis not present

## 2022-10-22 DIAGNOSIS — M25562 Pain in left knee: Secondary | ICD-10-CM | POA: Diagnosis not present

## 2022-10-22 DIAGNOSIS — S8992XA Unspecified injury of left lower leg, initial encounter: Secondary | ICD-10-CM | POA: Diagnosis present

## 2022-10-22 DIAGNOSIS — M79641 Pain in right hand: Secondary | ICD-10-CM | POA: Diagnosis not present
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
## 2022-10-23 ENCOUNTER — Emergency Department (HOSPITAL_COMMUNITY): Payer: Medicare Other

## 2022-10-23 DIAGNOSIS — M79641 Pain in right hand: Secondary | ICD-10-CM | POA: Diagnosis not present

## 2022-10-23 DIAGNOSIS — M25562 Pain in left knee: Secondary | ICD-10-CM | POA: Diagnosis not present

## 2022-10-23 MED ORDER — HYDROCODONE-ACETAMINOPHEN 5-325 MG PO TABS
1.0000 | ORAL_TABLET | Freq: Once | ORAL | Status: AC
  Administered 2022-10-23: 1 via ORAL
  Filled 2022-10-23: qty 1

## 2022-10-23 MED ORDER — TRAMADOL HCL 50 MG PO TABS: 50.0000 mg | ORAL_TABLET | Freq: Four times a day (QID) | ORAL | 0 refills | Status: DC | PRN

## 2022-10-23 NOTE — ED Notes (Signed)
ED Provider at bedside. 
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
## 2022-10-23 NOTE — ED Notes (Signed)
Pt taken to the bathroom via wheelchair with 2 person assist

## 2022-10-27 ENCOUNTER — Ambulatory Visit (INDEPENDENT_AMBULATORY_CARE_PROVIDER_SITE_OTHER): Payer: Medicare Other | Admitting: Family Medicine

## 2022-10-27 ENCOUNTER — Encounter: Payer: Self-pay | Admitting: Family Medicine

## 2022-10-27 VITALS — BP 157/77 | HR 85 | Ht 61.0 in | Wt 221.0 lb

## 2022-10-27 DIAGNOSIS — S8392XD Sprain of unspecified site of left knee, subsequent encounter: Secondary | ICD-10-CM

## 2022-10-27 DIAGNOSIS — S60031A Contusion of right middle finger without damage to nail, initial encounter: Secondary | ICD-10-CM

## 2022-10-27 DIAGNOSIS — S8392XA Sprain of unspecified site of left knee, initial encounter: Secondary | ICD-10-CM | POA: Diagnosis not present

## 2022-10-27 DIAGNOSIS — S60031D Contusion of right middle finger without damage to nail, subsequent encounter: Secondary | ICD-10-CM

## 2022-10-27 MED ORDER — TRAMADOL HCL 50 MG PO TABS: 50.0000 mg | ORAL_TABLET | Freq: Two times a day (BID) | ORAL | 0 refills | Status: DC | PRN
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
## 2022-10-28 ENCOUNTER — Telehealth: Payer: Self-pay | Admitting: *Deleted

## 2022-10-28 ENCOUNTER — Telehealth: Payer: Self-pay | Admitting: Family Medicine

## 2022-10-28 NOTE — Telephone Encounter (Signed)
Pt wants to change PT location to [facility] RE[facility] in [place]. Pt said she does not know that phone number.

## 2022-10-28 NOTE — Telephone Encounter (Signed)
Transition Care Management Unsuccessful Follow-up Telephone Call  Date of discharge and from where:  [facility]  Attempts:  1st Attempt  Reason for unsuccessful TCM follow-up call:  No answer/busy

## 2022-10-29 ENCOUNTER — Telehealth: Payer: Self-pay | Admitting: *Deleted

## 2022-10-29 NOTE — Telephone Encounter (Signed)
Transition Care Management Unsuccessful Follow-up Telephone Call  Date of discharge and from where:  [name]  10/23/2022  Attempts:  2nd Attempt  Reason for unsuccessful TCM follow-up call:  Left voice message

## 2022-11-19 DIAGNOSIS — M6281 Muscle weakness (generalized): Secondary | ICD-10-CM | POA: Diagnosis not present

## 2022-11-19 DIAGNOSIS — M25562 Pain in left knee: Secondary | ICD-10-CM | POA: Diagnosis not present

## 2022-12-03 ENCOUNTER — Ambulatory Visit: Payer: Medicare Other | Admitting: Nurse Practitioner

## 2022-12-03 DIAGNOSIS — N189 Chronic kidney disease, unspecified: Secondary | ICD-10-CM | POA: Diagnosis not present

## 2022-12-03 DIAGNOSIS — E0590 Thyrotoxicosis, unspecified without thyrotoxic crisis or storm: Secondary | ICD-10-CM | POA: Diagnosis not present

## 2022-12-03 DIAGNOSIS — E870 Hyperosmolality and hypernatremia: Secondary | ICD-10-CM | POA: Diagnosis not present

## 2022-12-03 DIAGNOSIS — I129 Hypertensive chronic kidney disease with stage 1 through stage 4 chronic kidney disease, or unspecified chronic kidney disease: Secondary | ICD-10-CM | POA: Diagnosis not present

## 2022-12-03 DIAGNOSIS — D638 Anemia in other chronic diseases classified elsewhere: Secondary | ICD-10-CM | POA: Diagnosis not present

## 2022-12-03 DIAGNOSIS — I5032 Chronic diastolic (congestive) heart failure: Secondary | ICD-10-CM | POA: Diagnosis not present

## 2022-12-03 DIAGNOSIS — E1122 Type 2 diabetes mellitus with diabetic chronic kidney disease: Secondary | ICD-10-CM | POA: Diagnosis not present

## 2022-12-04 DIAGNOSIS — M25562 Pain in left knee: Secondary | ICD-10-CM | POA: Diagnosis not present

## 2022-12-04 DIAGNOSIS — M6281 Muscle weakness (generalized): Secondary | ICD-10-CM | POA: Diagnosis not present

## 2022-12-04 LAB — T4, FREE: Free T4: 1.06 ng/dL (ref 0.82–1.77)

## 2022-12-04 LAB — T3, FREE: T3, Free: 2.9 pg/mL (ref 2.0–4.4)

## 2022-12-04 LAB — TSH: TSH: 0.723 u[IU]/mL (ref 0.450–4.500)

## 2022-12-11 ENCOUNTER — Ambulatory Visit: Payer: Medicare Other | Admitting: Gastroenterology

## 2022-12-11 ENCOUNTER — Encounter: Payer: Self-pay | Admitting: Gastroenterology

## 2022-12-15 ENCOUNTER — Ambulatory Visit (INDEPENDENT_AMBULATORY_CARE_PROVIDER_SITE_OTHER): Payer: Medicare Other | Admitting: Family Medicine

## 2022-12-15 ENCOUNTER — Encounter: Payer: Self-pay | Admitting: Family Medicine

## 2022-12-15 VITALS — BP 181/85 | HR 70 | Ht 61.0 in | Wt 217.0 lb

## 2022-12-15 DIAGNOSIS — F419 Anxiety disorder, unspecified: Secondary | ICD-10-CM | POA: Diagnosis not present

## 2022-12-15 DIAGNOSIS — B0229 Other postherpetic nervous system involvement: Secondary | ICD-10-CM | POA: Diagnosis not present

## 2022-12-15 MED ORDER — BUPROPION HCL ER (XL) 150 MG PO TB24
150.0000 mg | ORAL_TABLET | Freq: Every day | ORAL | 2 refills | Status: DC
Start: 2022-12-15 — End: 2023-01-15

## 2022-12-15 MED ORDER — METHYLPREDNISOLONE ACETATE 80 MG/ML IJ SUSP
80.0000 mg | Freq: Once | INTRAMUSCULAR | Status: AC
Start: 2022-12-15 — End: ?
  Administered 2022-12-15: 80 mg via INTRA_ARTICULAR

## 2022-12-15 NOTE — Progress Notes (Signed)
BP (!) 181/85   Pulse 70   Ht 5\' 1"  (1.549 m)   Wt 217 lb (98.4 kg)   SpO2 99%   BMI 41.00 kg/m    Subjective:   Patient ID: Shelly [name], female    DOB: 08/19/1940, 82 y.o.   MRN: [id number]  HPI: Shelly [name] is a 82 y.o. female presenting on 12/15/2022 for Medical Management of Chronic Issues, Diabetes, and Back Pain (Right lower)   HPI Depression Patient is coming in to discuss depression.  She has lost her son recently to cancer and is struggling with that.  She has been struggling more with her neuropathy as well during this time.  She just is feeling more down and having a lot of grief and anxiety and just not feeling super well.  Her neuralgia has flared up and is worse as well.  She denies any suicidal ideations or thoughts of hurting herself.  Her postherpetic neuralgia is hurting in the right lower back.    10/27/2022    4:01 PM 10/15/2022    2:23 PM 09/11/2022    1:07 PM 07/14/2022    3:03 PM 01/31/2022    3:23 PM  Depression screen PHQ 2/9  Decreased Interest 1 1 1 2 1   Down, Depressed, Hopeless 1 0 0 0 1  PHQ - 2 Score 2 1 1 2 2   Altered sleeping 0 0 2 2 2   Tired, decreased energy 1 1 2 2 2   Change in appetite 1 1 2  0 1  Feeling bad or failure about yourself  1  1 0 1  Trouble concentrating 0  0 0 1  Moving slowly or fidgety/restless 2  2 2 1   Suicidal thoughts 0  0 0 1  PHQ-9 Score 7 3 10 8 11   Difficult doing work/chores Somewhat difficult  Somewhat difficult Not difficult at all Somewhat difficult     Relevant past medical, surgical, family and social history reviewed and updated as indicated. Interim medical history since our last visit reviewed. Allergies and medications reviewed and updated.  Review of Systems  Constitutional:  Negative for chills and fever.  Eyes:  Negative for visual disturbance.  Respiratory:  Negative for chest tightness and shortness of breath.   Cardiovascular:  Negative for chest pain and leg swelling.  Genitourinary:   Negative for difficulty urinating and dysuria.  Musculoskeletal:  Positive for arthralgias and back pain. Negative for gait problem.  Skin:  Negative for rash.  Neurological:  Negative for light-headedness and headaches.  Psychiatric/Behavioral:  Positive for dysphoric mood. Negative for agitation, behavioral problems, self-injury, sleep disturbance and suicidal ideas. The patient is nervous/anxious.   All other systems reviewed and are negative.   Per HPI unless specifically indicated above   Allergies as of 12/15/2022       Reactions   Nsaids Other (See Comments)   AVOID all NSAID due to history of GI bleeding   Atorvastatin Other (See Comments)   Joint & muscle pain        Medication List        Accurate as of December 15, 2022  4:48 PM. If you have any questions, ask your nurse or doctor.          amitriptyline 25 MG tablet Commonly known as: ELAVIL Take 1 tablet (25 mg total) by mouth at bedtime.   buPROPion 150 MG 24 hr tablet Commonly known as: Wellbutrin XL Take 1 tablet (150 mg total) by mouth daily. Started  by: Elige Radon Baldemar Dady   cetirizine 10 MG tablet Commonly known as: ZYRTEC Take 10 mg by mouth daily.   chlorthalidone 25 MG tablet Commonly known as: HYGROTON TAKE ONE TABLET BY MOUTH ONCE DAILY   clopidogrel 75 MG tablet Commonly known as: PLAVIX Take 1 tablet (75 mg total) by mouth daily.   diclofenac Sodium 1 % Gel Commonly known as: Voltaren Apply 2 g topically 4 (four) times daily.   gabapentin 100 MG capsule Commonly known as: NEURONTIN Take 1 capsule (100 mg total) by mouth 3 (three) times daily.   hydrALAZINE 50 MG tablet Commonly known as: APRESOLINE Take by mouth.   hydrocortisone 2.5 % rectal cream Commonly known as: ANUSOL-HC Place 1 Application rectally 2 (two) times daily. As needed for rectal discomfort.   metoprolol succinate 50 MG 24 hr tablet Commonly known as: TOPROL-XL TAKE ONE TABLET BY MOUTH EVERY DAY IMMEDIATELY  FOLLOWING A MEAL DAILY   montelukast 10 MG tablet Commonly known as: SINGULAIR TAKE ONE TABLET BY MOUTH AT BEDTIME   multivitamin tablet Take 1 tablet by mouth daily.   nitroGLYCERIN 0.4 MG SL tablet Commonly known as: NITROSTAT Place 1 tablet under the tongue every 5 minutes x 3 doses as needed for chest pain (if no relief after 3rd dose, proceed to ED or call 911).   rosuvastatin 40 MG tablet Commonly known as: CRESTOR Take 1 tablet (40 mg total) by mouth daily.   traMADol 50 MG tablet Commonly known as: ULTRAM Take 1 tablet (50 mg total) by mouth every 12 (twelve) hours as needed.         Objective:   BP (!) 181/85   Pulse 70   Ht 5\' 1"  (1.549 m)   Wt 217 lb (98.4 kg)   SpO2 99%   BMI 41.00 kg/m   Wt Readings from Last 3 Encounters:  12/15/22 217 lb (98.4 kg)  10/27/22 221 lb (100.2 kg)  10/22/22 218 lb 7.6 oz (99.1 kg)    Physical Exam Vitals and nursing note reviewed.  Constitutional:      General: She is not in acute distress.    Appearance: She is well-developed. She is not diaphoretic.  Eyes:     Conjunctiva/sclera: Conjunctivae normal.  Cardiovascular:     Rate and Rhythm: Normal rate and regular rhythm.     Heart sounds: Normal heart sounds. No murmur heard. Pulmonary:     Effort: Pulmonary effort is normal. No respiratory distress.     Breath sounds: Normal breath sounds. No wheezing.  Musculoskeletal:        General: Normal range of motion.     Lumbar back: Tenderness present. Negative right straight leg raise test and negative left straight leg raise test.       Back:  Skin:    General: Skin is warm and dry.     Findings: No rash.  Neurological:     Mental Status: She is alert and oriented to person, place, and time.     Coordination: Coordination normal.  Psychiatric:        Mood and Affect: Mood is anxious and depressed.        Behavior: Behavior normal.        Thought Content: Thought content does not include suicidal ideation.  Thought content does not include suicidal plan.       Assessment & Plan:   Problem List Items Addressed This Visit       Nervous and Auditory   Postherpetic neuralgia  Relevant Medications   methylPREDNISolone acetate (DEPO-MEDROL) injection 80 mg (Start on 12/15/2022  5:00 PM)     Other   Anxiety - Primary   Relevant Medications   buPROPion (WELLBUTRIN XL) 150 MG 24 hr tablet  Will start Wellbutrin to see if it helps with anxiety and depression.  She tried Cymbalta before and did not do well with it.  She is on amitriptyline as well and we will restart that.  Patient is coming back in a months for her normal visit we will do a recheck then.  Follow up plan: Return if symptoms worsen or fail to improve.  Counseling provided for all of the vaccine components No orders of the defined types were placed in this encounter.   Arville Care, MD [facility] 12/15/2022, 4:48 PM

## 2022-12-18 DIAGNOSIS — M25562 Pain in left knee: Secondary | ICD-10-CM | POA: Diagnosis not present

## 2022-12-18 DIAGNOSIS — M6281 Muscle weakness (generalized): Secondary | ICD-10-CM | POA: Diagnosis not present

## 2022-12-22 DIAGNOSIS — I5032 Chronic diastolic (congestive) heart failure: Secondary | ICD-10-CM | POA: Diagnosis not present

## 2022-12-22 DIAGNOSIS — R809 Proteinuria, unspecified: Secondary | ICD-10-CM | POA: Diagnosis not present

## 2022-12-22 DIAGNOSIS — E1129 Type 2 diabetes mellitus with other diabetic kidney complication: Secondary | ICD-10-CM | POA: Diagnosis not present

## 2022-12-22 DIAGNOSIS — E1122 Type 2 diabetes mellitus with diabetic chronic kidney disease: Secondary | ICD-10-CM | POA: Diagnosis not present

## 2022-12-25 ENCOUNTER — Ambulatory Visit: Payer: Medicare Other | Admitting: Nurse Practitioner

## 2022-12-25 DIAGNOSIS — E0590 Thyrotoxicosis, unspecified without thyrotoxic crisis or storm: Secondary | ICD-10-CM

## 2022-12-31 ENCOUNTER — Ambulatory Visit: Payer: Medicare Other | Admitting: Nurse Practitioner

## 2022-12-31 DIAGNOSIS — E0590 Thyrotoxicosis, unspecified without thyrotoxic crisis or storm: Secondary | ICD-10-CM

## 2023-01-01 ENCOUNTER — Ambulatory Visit: Payer: Medicare Other | Admitting: Nurse Practitioner

## 2023-01-01 ENCOUNTER — Encounter: Payer: Self-pay | Admitting: Nurse Practitioner

## 2023-01-01 VITALS — BP 130/82 | HR 73 | Ht 61.0 in | Wt 216.6 lb

## 2023-01-01 DIAGNOSIS — E0590 Thyrotoxicosis, unspecified without thyrotoxic crisis or storm: Secondary | ICD-10-CM | POA: Diagnosis not present

## 2023-01-01 NOTE — Progress Notes (Signed)
01/01/2023, 3:35 PM                                     Endocrinology follow-up note    Subjective:    Patient ID: Shelly [name], female    DOB: 1940/12/29, PCP [name], Elige Radon, MD   Past Medical History:  Diagnosis Date   Allergy    Anemia    GI bleed   Anginal pain (HCC)    Anxiety    Arthritis    Blood transfusion without reported diagnosis    CAD (coronary artery disease)    a. s/p NSTEMI in 2015 with DES x2 to RCA   Cataract    Chronic kidney disease    Coronary artery disease    SEVERE   GERD (gastroesophageal reflux disease)    GI bleed 12/2013   Goiter    Hyperlipidemia    Hypertension    Myocardial infarct [facility])    Myocardial infarction (HCC) 08/2013   Neuralgia of right lower extremity 07/13/2017   Pancolonic diverticulosis 12/2013   Post herpetic neuralgia    Past Surgical History:  Procedure Laterality Date   CARPAL TUNNEL RELEASE Bilateral    COLONOSCOPY N/A 01/13/2014   Dr. Hung:pandiverticulosis    COLONOSCOPY N/A 12/14/2015   Dr. Bosie Clos: pancolonic diverticulosis, internal hemorrhoids    CORNEAL TRANSPLANT     CORONARY ANGIOPLASTY  08/2013   ESOPHAGOGASTRODUODENOSCOPY N/A 12/14/2015   Dr. Bosie Clos: normal    GIVENS CAPSULE STUDY N/A 08/13/2016   Dr. Darrick Penna: enteritis due to aspirin.    HEEL SPUR EXCISION     LEFT HEART CATHETERIZATION WITH CORONARY ANGIOGRAM N/A 08/23/2013   Procedure: LEFT HEART CATHETERIZATION WITH CORONARY ANGIOGRAM;  Surgeon: [name], MD;  Location: [facility] CATH LAB;  Service: Cardiovascular;  Laterality: N/A;   PERCUTANEOUS CORONARY STENT INTERVENTION (PCI-S) N/A 08/24/2013   Procedure: PERCUTANEOUS CORONARY STENT INTERVENTION (PCI-S);  Surgeon: [name], MD;  Location: [facility] CATH LAB;  Service: Cardiovascular;  Laterality: N/A;   TONSILLECTOMY     TUBAL LIGATION     Social History   Socioeconomic History   Marital status: Divorced    Spouse name: Not on file    Number of children: 8   Years of education: 11   Highest education level: 11th grade  Occupational History   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   No daily caffeine use.   2 story home but she only has to use one level   Social Determinants of Corporate investment banker Strain: Low Risk  (  08/08/2021)   Overall Financial Resource Strain (CARDIA)    Difficulty of Paying Living Expenses: Not hard at all  Food Insecurity: No Food Insecurity (08/08/2021)   Hunger Vital Sign    Worried About Running Out of Food in the Last Year: Never true    Ran Out of Food in the Last Year: Never true  Transportation Needs: No Transportation Needs (08/08/2021)   PRAPARE - Administrator, Civil Service (Medical): No    Lack of Transportation (Non-Medical): No  Physical Activity: Inactive (12/17/2021)   Exercise Vital Sign    Days of Exercise per Week: 0 days    Minutes of Exercise per Session: 0 min  Stress: Stress Concern Present (12/17/2021)   [employer] of Occupational Health - Occupational Stress Questionnaire    Feeling of Stress : To some extent  Social Connections: Socially Isolated (08/08/2021)   Social Connection and Isolation Panel [NHANES]    Frequency of Communication with Friends and Family: More than three times a week    Frequency of Social Gatherings with Friends and Family: More than three times a week    Attends Religious Services: Never    Automotive engineer or Organizations: No    Attends Banker Meetings: Never    Marital Status: Widowed   Family History  Problem Relation Age of Onset   Hypertension Brother    Transient ischemic attack Brother    Cancer Mother        unsure of type    Other Father        grangrene    Asthma Sister    Stroke Sister    COPD Sister    Stroke Sister    Early death Brother    Early death Brother    Liver cancer Daughter    Cancer Daughter    Hernia Son        Umbilical   GI problems Son    Pancreatic disease Daughter        pancreatectomy   Cancer Daughter    Diverticulitis Daughter    Arthritis Daughter        knee    Arthritis Daughter        shoulder    Anemia Daughter    GER disease Son    Colon cancer Neg Hx    Colon polyps Neg Hx    Outpatient Encounter Medications as of 01/01/2023  Medication Sig   amitriptyline (ELAVIL) 25 MG tablet Take 1 tablet (25 mg total) by mouth at bedtime.   buPROPion (WELLBUTRIN XL) 150 MG 24 hr tablet Take 1 tablet (150 mg total) by mouth daily.   cetirizine (ZYRTEC) 10 MG tablet Take 10 mg by mouth daily.   chlorthalidone (HYGROTON) 25 MG tablet TAKE ONE TABLET BY MOUTH ONCE DAILY   clopidogrel (PLAVIX) 75 MG tablet Take 1 tablet (75 mg total) by mouth daily.   diclofenac Sodium (VOLTAREN) 1 % GEL Apply 2 g topically 4 (four) times daily.   gabapentin (NEURONTIN) 100 MG capsule Take 1 capsule (100 mg total) by mouth 3 (three) times daily.   hydrALAZINE (APRESOLINE) 50 MG tablet Take by mouth.   hydrocortisone (ANUSOL-HC) 2.5 % rectal cream Place 1 Application rectally 2 (two) times daily. As needed for rectal discomfort.   metoprolol succinate (TOPROL-XL) 50 MG 24 hr tablet TAKE ONE TABLET BY MOUTH EVERY DAY IMMEDIATELY FOLLOWING A MEAL DAILY   montelukast (SINGULAIR) 10 MG tablet TAKE ONE TABLET BY MOUTH  AT BEDTIME   Multiple Vitamin (MULTIVITAMIN) tablet Take 1 tablet by mouth daily.   nitroGLYCERIN (NITROSTAT) 0.4 MG SL  tablet Place 1 tablet under the tongue every 5 minutes x 3 doses as needed for chest pain (if no relief after 3rd dose, proceed to ED or call 911).   rosuvastatin (CRESTOR) 40 MG tablet Take 1 tablet (40 mg total) by mouth daily.   traMADol (ULTRAM) 50 MG tablet Take 1 tablet (50 mg total) by mouth every 12 (twelve) hours as needed.   No facility-administered encounter medications on file as of 01/01/2023.   ALLERGIES: Allergies  Allergen Reactions   Nsaids Other (See Comments)    AVOID all NSAID due to history of GI bleeding   Atorvastatin Other (See Comments)    Joint & muscle pain    VACCINATION STATUS: Immunization History  Administered Date(s) Administered   Pneumococcal Conjugate-13 10/02/2016   Pneumococcal Polysaccharide-23 08/25/2013   Tdap 07/14/2022   Zoster Recombinant(Shingrix) 03/17/2018    Thyroid Problem Presents for follow-up visit. Patient reports no anxiety, cold intolerance, constipation, depressed mood, fatigue, heat intolerance, leg swelling, palpitations, tremors, weight gain or weight loss. The symptoms have been stable.    Shelly [name] is 82 y.o. female who is seen in follow-up after she was seen in consultation for subclinical hyperthyroidism.    PMD: [name], Elige Radon, MD.  Due to septic symptoms of thyrotoxicosis, she was started on low-dose methimazole, which was since tapered down.  She has been off the Methimazole for some time now with stable thyroid response.  She denies any history of goiter, no family history of thyroid dysfunction or thyroid malignancy.  Her thyroid uptake and scan on June 14, 2019 showed 24-hour uptake of 10.3% which is normal.  There was asymmetric thyroid activity with generally increased uptake in the right lobe.  No focal hot or cold nodules identified.  Her medical history includes hypertension and hyperlipidemia on treatment.  Review of systems  Constitutional: + steadily decreasing body weight,  current  Body mass index is 40.93 kg/m. , no fatigue, no subjective hyperthermia, no subjective hypothermia Eyes: no blurry vision, no xerophthalmia ENT: no sore throat, no nodules palpated in throat, no dysphagia/odynophagia, no hoarseness Cardiovascular: no chest pain, no shortness of breath, no palpitations, no leg swelling Respiratory: no cough, no shortness of breath Gastrointestinal: no nausea/vomiting/diarrhea Musculoskeletal: no muscle/joint aches Skin: no rashes, no hyperemia Neurological: no tremors, no numbness, no tingling, no dizziness Psychiatric: no depression, no anxiety  Objective:    BP 130/82 (BP Location: Left Arm, Patient Position: Sitting, Cuff Size: Large)   Pulse 73   Ht 5\' 1"  (1.549 m)   Wt 216 lb 9.6 oz (98.2 kg)   BMI 40.93 kg/m   Wt Readings from Last 3 Encounters:  01/01/23 216 lb 9.6 oz (98.2 kg)  12/15/22 217 lb (98.4 kg)  10/27/22 221 lb (100.2 kg)    BP Readings from Last 3 Encounters:  01/01/23 130/82  12/15/22 (!) 181/85  10/27/22 (!) 157/77     Physical Exam- Limited  Constitutional:  Body mass index is 40.93 kg/m. , not in acute distress, tearful today (recently lost her son) Eyes:  EOMI, no exophthalmos Musculoskeletal: no gross deformities, strength intact in all four extremities, no gross restriction of joint movements Skin:  no rashes, no hyperemia Neurological: no tremor with outstretched hands    CMP     Component Value Date/Time   NA 141 10/15/2022 1421   K 4.3 10/15/2022  1421   CL 107 (H) 10/15/2022 1421   CO2 20 10/15/2022 1421   GLUCOSE 122 (H) 10/15/2022 1421   GLUCOSE 114 (H) 04/05/2021 1319   BUN 36 (H) 10/15/2022 1421   CREATININE 1.93 (H) 10/15/2022 1421   CREATININE 1.31 (H) 12/21/2018 1547   CALCIUM 9.8 10/15/2022 1421   PROT 7.2 10/15/2022 1421   ALBUMIN 3.8 10/15/2022 1421   AST 14 10/15/2022 1421   ALT 8 10/15/2022 1421   ALKPHOS 92 10/15/2022 1421   BILITOT 0.2 10/15/2022 1421   GFRNONAA 32 (L) 04/05/2021  1319   GFRAA 34 (L) 04/30/2020 1510     Diabetic Labs (most recent): Lab Results  Component Value Date   HGBA1C 6.4 (H) 10/15/2022   HGBA1C 7.3 (H) 07/14/2022   HGBA1C 5.9 (H) 01/31/2022   MICROALBUR 18.6 (H) 04/05/2021     Lipid Panel ( most recent) Lipid Panel     Component Value Date/Time   CHOL 151 10/15/2022 1421   TRIG 96 10/15/2022 1421   HDL 53 10/15/2022 1421   CHOLHDL 2.8 10/15/2022 1421   CHOLHDL 2.6 11/20/2016 0854   VLDL 11 11/20/2016 0854   LDLCALC 80 10/15/2022 1421   LABVLDL 18 10/15/2022 1421      Lab Results  Component Value Date   TSH 0.723 12/03/2022   TSH 0.069 (L) 08/18/2022   TSH 0.175 (L) 05/21/2022   TSH 0.134 (L) 02/03/2022   TSH 0.287 (L) 01/31/2022   TSH 0.057 (L) 10/30/2021   TSH 0.062 (L) 10/17/2021   TSH 0.195 (L) 07/24/2021   TSH 0.277 (L) 06/27/2021   TSH 0.224 (L) 04/02/2021   FREET4 1.06 12/03/2022   FREET4 1.03 08/18/2022   FREET4 0.98 05/21/2022   FREET4 1.11 02/03/2022   FREET4 1.11 10/17/2021   FREET4 0.96 07/24/2021   FREET4 0.88 04/02/2021   FREET4 1.02 10/16/2020   FREET4 1.00 07/04/2020   FREET4 0.97 05/03/2020     Latest Reference Range & Units 10/30/21 15:36 01/31/22 15:57 02/03/22 15:13 05/21/22 14:50 08/18/22 14:37 12/03/22 16:56  TSH 0.450 - 4.500 uIU/mL 0.057 (L) 0.287 (L) 0.134 (L) 0.175 (L) 0.069 (L) 0.723  Triiodothyronine,Free,Serum 2.0 - 4.4 pg/mL   3.1 2.8 2.7 2.9  T4,Free(Direct) 0.82 - 1.77 ng/dL   8.29 5.62 1.30 8.65  Thyroxine (T4) 4.5 - 12.0 ug/dL 6.1 6.1      Free Thyroxine Index 1.2 - 4.9  1.7 1.4      T3 Uptake Ratio 24 - 39 % 28 23 (L)      (L): Data is abnormally low  Assessment & Plan:   1. Subclinical hyperthyroidism  -Her most recent thyroid function tests are once again stable, TSH now low end of normal and again FT3 and FT4 are normal.  There is no need for antithyroid treatment at this time.   Will monitor TFTs again in 3 months (patient prefers to have more frequent checkups than  the 6 month I offered her).    - she is advised to maintain close follow up with [name], Elige Radon, MD for primary care needs.    I spent  26  minutes in the care of the patient today including review of labs from Thyroid Function, CMP, and other relevant labs ; imaging/biopsy records (current and previous including abstractions from other facilities); face-to-face time discussing  her lab results and symptoms, medications doses, her options of short and long term treatment based on the latest standards of care / guidelines;   and documenting  the encounter.  Shelly [name]  participated in the discussions, expressed understanding, and voiced agreement with the above plans.  All questions were answered to her satisfaction. she is encouraged to contact clinic should she have any questions or concerns prior to her return visit.   Follow up plan: Return in about 3 months (around 04/03/2023) for Thyroid follow up, Previsit labs.   Ronny Bacon, [facility] [address] [place], [place] [address] Phone: [phone] Fax: [phone]   01/01/2023, 3:35 PM

## 2023-01-13 ENCOUNTER — Encounter: Payer: Self-pay | Admitting: Gastroenterology

## 2023-01-13 ENCOUNTER — Ambulatory Visit: Payer: Medicare Other | Admitting: Gastroenterology

## 2023-01-13 VITALS — BP 128/65 | HR 84 | Temp 98.6°F | Ht 61.0 in | Wt 216.4 lb

## 2023-01-13 DIAGNOSIS — R634 Abnormal weight loss: Secondary | ICD-10-CM | POA: Diagnosis not present

## 2023-01-13 NOTE — Progress Notes (Signed)
Gastroenterology Office Note     Primary Care Physician:  [name], Elige Radon, MD  Primary Gastroenterologist:Dr. Jena Gauss    Chief Complaint   Chief Complaint  Patient presents with   Follow-up    Follow up on GERD.     History of Present Illness   Shelly [name] is an 82 y.o. female presenting today with a history of  chronic GERD, diverticular bleed many years prior, IDA with capsule study showing enteritis due to aspirin use, and chronic normocytic anemia with chronic disease component.  She prefers frequent visits.    Jan 2024 was 236. She is 216 lbs now.  Will take PPI prn. No overt GI bleeding, no dysphagia. Continues to lose weight. Concerned. Still dealing with post-herpetic neuralgia. She has had unintentional weight loss.   Multiple children with malignancies. No FH colon cancer or polyps. TSH had been low previously but now normalizing. LFTs normal.   Last colonoscopy 2017.   Past Medical History:  Diagnosis Date   Allergy    Anemia    GI bleed   Anginal pain (HCC)    Anxiety    Arthritis    Blood transfusion without reported diagnosis    CAD (coronary artery disease)    a. s/p NSTEMI in 2015 with DES x2 to RCA   Cataract    Chronic kidney disease    Coronary artery disease    SEVERE   GERD (gastroesophageal reflux disease)    GI bleed 12/2013   Goiter    Hyperlipidemia    Hypertension    Myocardial infarct [facility])    Myocardial infarction (HCC) 08/2013   Neuralgia of right lower extremity 07/13/2017   Pancolonic diverticulosis 12/2013   Post herpetic neuralgia     Past Surgical History:  Procedure Laterality Date   CARPAL TUNNEL RELEASE Bilateral    COLONOSCOPY N/A 01/13/2014   Dr. Hung:pandiverticulosis    COLONOSCOPY N/A 12/14/2015   Dr. Bosie Clos: pancolonic diverticulosis, internal hemorrhoids    CORNEAL TRANSPLANT     CORONARY ANGIOPLASTY  08/2013   ESOPHAGOGASTRODUODENOSCOPY N/A 12/14/2015   Dr. Bosie Clos: normal    GIVENS CAPSULE STUDY  N/A 08/13/2016   Dr. Darrick Penna: enteritis due to aspirin.    HEEL SPUR EXCISION     LEFT HEART CATHETERIZATION WITH CORONARY ANGIOGRAM N/A 08/23/2013   Procedure: LEFT HEART CATHETERIZATION WITH CORONARY ANGIOGRAM;  Surgeon: [name], MD;  Location: [facility] CATH LAB;  Service: Cardiovascular;  Laterality: N/A;   PERCUTANEOUS CORONARY STENT INTERVENTION (PCI-S) N/A 08/24/2013   Procedure: PERCUTANEOUS CORONARY STENT INTERVENTION (PCI-S);  Surgeon: [name], MD;  Location: [facility] CATH LAB;  Service: Cardiovascular;  Laterality: N/A;   TONSILLECTOMY     TUBAL LIGATION      Current Outpatient Medications  Medication Sig Dispense Refill   amitriptyline (ELAVIL) 25 MG tablet Take 1 tablet (25 mg total) by mouth at bedtime. 90 tablet 3   buPROPion (WELLBUTRIN XL) 150 MG 24 hr tablet Take 1 tablet (150 mg total) by mouth daily. 30 tablet 2   cetirizine (ZYRTEC) 10 MG tablet Take 10 mg by mouth daily.     chlorthalidone (HYGROTON) 25 MG tablet TAKE ONE TABLET BY MOUTH ONCE DAILY 90 tablet 0   clopidogrel (PLAVIX) 75 MG tablet Take 1 tablet (75 mg total) by mouth daily. 30 tablet 2   diclofenac Sodium (VOLTAREN) 1 % GEL Apply 2 g topically 4 (four) times daily. 350 g 3   gabapentin (NEURONTIN) 100 MG capsule Take  1 capsule (100 mg total) by mouth 3 (three) times daily. 270 capsule 3   hydrALAZINE (APRESOLINE) 50 MG tablet Take by mouth.     hydrocortisone (ANUSOL-HC) 2.5 % rectal cream Place 1 Application rectally 2 (two) times daily. As needed for rectal discomfort. 30 g 1   metoprolol succinate (TOPROL-XL) 50 MG 24 hr tablet TAKE ONE TABLET BY MOUTH EVERY DAY IMMEDIATELY FOLLOWING A MEAL DAILY 90 tablet 3   montelukast (SINGULAIR) 10 MG tablet TAKE ONE TABLET BY MOUTH AT BEDTIME 90 tablet 1   Multiple Vitamin (MULTIVITAMIN) tablet Take 1 tablet by mouth daily.     nitroGLYCERIN (NITROSTAT) 0.4 MG SL tablet Place 1 tablet under the tongue every 5 minutes x 3 doses as needed for chest pain (if no relief  after 3rd dose, proceed to ED or call 911). 25 tablet 3   rosuvastatin (CRESTOR) 40 MG tablet Take 1 tablet (40 mg total) by mouth daily. 90 tablet 3   traMADol (ULTRAM) 50 MG tablet Take 1 tablet (50 mg total) by mouth every 12 (twelve) hours as needed. 30 tablet 0   No current facility-administered medications for this visit.    Allergies as of 01/13/2023 - Review Complete 01/13/2023  Allergen Reaction Noted   Nsaids Other (See Comments) 07/25/2016   Atorvastatin Other (See Comments) 11/30/2018    Family History  Problem Relation Age of Onset   Hypertension Brother    Transient ischemic attack Brother    Cancer Mother        unsure of type    Other Father        grangrene    Asthma Sister    Stroke Sister    COPD Sister    Stroke Sister    Early death Brother    Early death Brother    Liver cancer Daughter    Cancer Daughter    Hernia Son        Umbilical   GI problems Son    Pancreatic disease Daughter        pancreatectomy   Cancer Daughter    Diverticulitis Daughter    Arthritis Daughter        knee    Arthritis Daughter        shoulder    Anemia Daughter    GER disease Son    Colon cancer Neg Hx    Colon polyps Neg Hx     Social History   Socioeconomic History   Marital status: Divorced    Spouse name: Not on file   Number of children: 8   Years of education: 11   Highest education level: 11th grade  Occupational History   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   No daily caffeine use.   2 story home but she only has to use one level   Social Determinants of Corporate investment banker Strain:  Low Risk  (08/08/2021)   Overall Financial Resource Strain (CARDIA)    Difficulty of Paying Living Expenses: Not hard at all  Food Insecurity: No Food Insecurity (08/08/2021)   Hunger Vital Sign    Worried About Running Out of Food in the Last Year: Never true    Ran Out of Food in the Last Year: Never true  Transportation Needs: No Transportation Needs (08/08/2021)   PRAPARE - Administrator, Civil Service (Medical): No    Lack of Transportation (Non-Medical): No  Physical Activity: Inactive (12/17/2021)   Exercise Vital Sign    Days of Exercise per Week: 0 days    Minutes of Exercise per Session: 0 min  Stress: Stress Concern Present (12/17/2021)   [employer] of Occupational Health - Occupational Stress Questionnaire    Feeling of Stress : To some extent  Social Connections: Socially Isolated (08/08/2021)   Social Connection and Isolation Panel [NHANES]    Frequency of Communication with Friends and Family: More than three times a week    Frequency of Social Gatherings with Friends and Family: More than three times a week    Attends Religious Services: Never    Database administrator or Organizations: No    Attends Banker Meetings: Never    Marital Status: Widowed  Intimate Partner Violence: Not At Risk (08/08/2021)   Humiliation, Afraid, Rape, and Kick questionnaire    Fear of Current or Ex-Partner: No    Emotionally Abused: No    Physically Abused: No    Sexually Abused: No     Review of Systems   Gen: see HPI CV: Denies chest pain, heart palpitations, peripheral edema, syncope.  Resp: Denies shortness of breath at rest or with exertion. Denies wheezing or cough.  GI: Denies dysphagia or odynophagia. Denies jaundice, hematemesis, fecal incontinence. GU : Denies urinary burning,  urinary frequency, urinary hesitancy MS: Denies joint pain, muscle weakness, cramps, or limitation of movement.  Derm: Denies rash, itching, dry skin Psych: Denies depression, anxiety, memory loss, and confusion Heme: Denies bruising, bleeding, and enlarged lymph nodes.   Physical Exam   BP 128/65   Pulse 84   Temp 98.6 F (37 C)   Ht 5\' 1"  (1.549 m)   Wt 216 lb 6.4 oz (98.2 kg)   BMI 40.89 kg/m  General:   Alert and oriented. Pleasant and cooperative. Well-nourished and well-developed.  Head:  Normocephalic and atraumatic. Eyes:  Without icterus Abdomen:  +BS, soft, non-tender and non-distended. No HSM noted. No guarding or rebound. No masses appreciated.  Rectal:  Deferred  Msk:  Symmetrical without gross deformities. Normal posture. Extremities:  With chronic lymphedema Neurologic:  Alert and  oriented x4;  grossly normal neurologically. Skin:  Intact without significant lesions or rashes. Psych:  Alert and cooperative. Normal mood and affect.   Assessment   Shelly [name] is a very pleasant 82 y.o. female presenting today with a history of  chronic GERD, diverticular bleed many years prior, IDA with capsule study showing enteritis due to aspirin use, and chronic normocytic anemia with chronic disease component.  She prefers frequent visits.   Unintentional weight loss noted of about 20 lbs since Jan 2024. No abdominal pain noted or food aversions. PPI just taken prn for GERD. No overt GI bleeding. She does have multiple children who have had malignancies and past. In fact, her son just recently passed. I do note TSH was low over this year but normalizing. Could certainly  be related to this. However, we will order CT scan.   Colonoscopy in 2017. No FH colon cancer or polyps.       PLAN    PPI prn CT in near future 3 month follow-up   Gelene Mink, PhD, ANP-BC [facility] Gastroenterology

## 2023-01-13 NOTE — Patient Instructions (Signed)
We are arranging a CT scan in the near future!  I will see you in 3 months regardless!  Gelene Mink, PhD, ANP-BC [facility] Gastroenterology

## 2023-01-14 ENCOUNTER — Ambulatory Visit (HOSPITAL_COMMUNITY): Payer: Medicare Other

## 2023-01-14 ENCOUNTER — Telehealth: Payer: Self-pay | Admitting: *Deleted

## 2023-01-14 NOTE — Telephone Encounter (Signed)
Spoke with pt. She was not able to do today. Got her rescheduled to 8/29, arrival 315pm, npo 4 hrs prior. Pt is aware

## 2023-01-14 NOTE — Telephone Encounter (Signed)
LMOVM to call back to see if she can go for her CT today at [facility] arrive at 430pm

## 2023-01-15 ENCOUNTER — Ambulatory Visit (HOSPITAL_COMMUNITY): Admission: RE | Admit: 2023-01-15 | Payer: Medicare Other | Source: Ambulatory Visit

## 2023-01-15 ENCOUNTER — Encounter: Payer: Self-pay | Admitting: Family Medicine

## 2023-01-15 ENCOUNTER — Ambulatory Visit (INDEPENDENT_AMBULATORY_CARE_PROVIDER_SITE_OTHER): Payer: Medicare Other | Admitting: Family Medicine

## 2023-01-15 ENCOUNTER — Inpatient Hospital Stay (HOSPITAL_COMMUNITY): Admission: RE | Admit: 2023-01-15 | Payer: Medicare Other | Source: Ambulatory Visit

## 2023-01-15 VITALS — BP 143/70 | HR 72 | Ht 61.0 in | Wt 215.0 lb

## 2023-01-15 DIAGNOSIS — E1122 Type 2 diabetes mellitus with diabetic chronic kidney disease: Secondary | ICD-10-CM | POA: Diagnosis not present

## 2023-01-15 DIAGNOSIS — E0590 Thyrotoxicosis, unspecified without thyrotoxic crisis or storm: Secondary | ICD-10-CM

## 2023-01-15 DIAGNOSIS — N183 Chronic kidney disease, stage 3 unspecified: Secondary | ICD-10-CM

## 2023-01-15 DIAGNOSIS — N1832 Chronic kidney disease, stage 3b: Secondary | ICD-10-CM | POA: Diagnosis not present

## 2023-01-15 DIAGNOSIS — E1159 Type 2 diabetes mellitus with other circulatory complications: Secondary | ICD-10-CM | POA: Diagnosis not present

## 2023-01-15 DIAGNOSIS — F419 Anxiety disorder, unspecified: Secondary | ICD-10-CM

## 2023-01-15 DIAGNOSIS — L2089 Other atopic dermatitis: Secondary | ICD-10-CM | POA: Diagnosis not present

## 2023-01-15 DIAGNOSIS — B0229 Other postherpetic nervous system involvement: Secondary | ICD-10-CM | POA: Diagnosis not present

## 2023-01-15 DIAGNOSIS — N1831 Chronic kidney disease, stage 3a: Secondary | ICD-10-CM | POA: Diagnosis not present

## 2023-01-15 DIAGNOSIS — I10 Essential (primary) hypertension: Secondary | ICD-10-CM

## 2023-01-15 DIAGNOSIS — I152 Hypertension secondary to endocrine disorders: Secondary | ICD-10-CM

## 2023-01-15 LAB — BAYER DCA HB A1C WAIVED: HB A1C (BAYER DCA - WAIVED): 6.2 % — ABNORMAL HIGH (ref 4.8–5.6)

## 2023-01-15 MED ORDER — GABAPENTIN 100 MG PO CAPS: 100.0000 mg | ORAL_CAPSULE | Freq: Four times a day (QID) | ORAL | 3 refills | Status: DC

## 2023-01-15 MED ORDER — BACLOFEN 10 MG PO TABS
10.0000 mg | ORAL_TABLET | Freq: Three times a day (TID) | ORAL | 2 refills | Status: DC
Start: 2023-01-15 — End: 2023-04-20

## 2023-01-15 MED ORDER — MONTELUKAST SODIUM 10 MG PO TABS
10.0000 mg | ORAL_TABLET | Freq: Every day | ORAL | 1 refills | Status: DC
Start: 2023-01-15 — End: 2023-07-31

## 2023-01-15 MED ORDER — BUPROPION HCL ER (XL) 150 MG PO TB24: 150.0000 mg | ORAL_TABLET | Freq: Every day | ORAL | 1 refills | Status: DC

## 2023-01-15 MED ORDER — CHLORTHALIDONE 25 MG PO TABS: 25.0000 mg | ORAL_TABLET | Freq: Every day | ORAL | 3 refills | Status: DC

## 2023-01-15 NOTE — Progress Notes (Signed)
BP (!) 143/70   Pulse 72   Ht 5\' 1"  (1.549 m)   Wt 215 lb (97.5 kg)   SpO2 97%   BMI 40.62 kg/m    Subjective:   Patient ID: Shelly [name], female    DOB: [birth year], 82 y.o.   MRN: [id number]  HPI: Shelly [name] is a 82 y.o. female presenting on 01/15/2023 for Medical Management of Chronic Issues, Chronic Kidney Disease, Hypertension, and Back Pain   HPI Type 2 diabetes mellitus Patient comes in today for recheck of his diabetes. Patient has been currently taking no medicine, diet control. Patient is not currently on an ACE inhibitor/ARB. Patient has not seen an ophthalmologist this year. Patient denies any new issues with their feet. The symptom started onset as an adult CAD and CKD and hypertension ARE RELATED TO DM   Subclinical hypothyroidism recheck Patient is coming in for thyroid recheck today as well. They deny any issues with hair changes or heat or cold problems or diarrhea or constipation. They deny any chest pain or palpitations. They are currently on no medicine, monitoring  Hypertension Patient is currently on metoprolol and chlorthalidone, and their blood pressure today is 143/70. Patient denies any lightheadedness or dizziness. Patient denies headaches, blurred vision, chest pains, shortness of breath, or weakness. Denies any side effects from medication and is content with current medication.   Anxiety and neuralgia recheck Patient is coming in today for anxiety and neurology recheck.  She currently takes Wellbutrin and amitriptyline and gabapentin for this.  She is having more of the neuropathy pain in her upper back and some sciatic in her lower back all on the right side.  She says the gabapentin is just not helping as much as it was.  She would like to try muscle relaxer and see about changing the gabapentin.  The 300 mg tablets were too strong for her    01/15/2023    2:31 PM 10/27/2022    4:01 PM 10/15/2022    2:23 PM 09/11/2022    1:07 PM 07/14/2022     3:03 PM  Depression screen PHQ 2/9  Decreased Interest 2 1 1 1 2   Down, Depressed, Hopeless 1 1 0 0 0  PHQ - 2 Score 3 2 1 1 2   Altered sleeping 0 0 0 2 2  Tired, decreased energy 1 1 1 2 2   Change in appetite 2 1 1 2  0  Feeling bad or failure about yourself  1 1  1  0  Trouble concentrating 0 0  0 0  Moving slowly or fidgety/restless 2 2  2 2   Suicidal thoughts 0 0  0 0  PHQ-9 Score 9 7 3 10 8   Difficult doing work/chores Somewhat difficult Somewhat difficult  Somewhat difficult Not difficult at all     Relevant past medical, surgical, family and social history reviewed and updated as indicated. Interim medical history since our last visit reviewed. Allergies and medications reviewed and updated.  Review of Systems  Constitutional:  Negative for chills and fever.  Eyes:  Negative for visual disturbance.  Respiratory:  Negative for chest tightness and shortness of breath.   Cardiovascular:  Negative for chest pain and leg swelling.  Genitourinary:  Negative for difficulty urinating and dysuria.  Musculoskeletal:  Positive for arthralgias, back pain and myalgias. Negative for gait problem.  Skin:  Negative for rash.  Neurological:  Negative for dizziness, light-headedness and headaches.  Psychiatric/Behavioral:  Negative for agitation and behavioral problems.  All other systems reviewed and are negative.   Per HPI unless specifically indicated above   Allergies as of 01/15/2023       Reactions   Nsaids Other (See Comments)   AVOID all NSAID due to history of GI bleeding   Atorvastatin Other (See Comments)   Joint & muscle pain        Medication List        Accurate as of January 15, 2023  3:01 PM. If you have any questions, ask your nurse or doctor.          amitriptyline 25 MG tablet Commonly known as: ELAVIL Take 1 tablet (25 mg total) by mouth at bedtime.   baclofen 10 MG tablet Commonly known as: LIORESAL Take 1 tablet (10 mg total) by mouth 3 (three)  times daily. Started by: Elige Radon Eilidh Marcano   buPROPion 150 MG 24 hr tablet Commonly known as: Wellbutrin XL Take 1 tablet (150 mg total) by mouth daily.   cetirizine 10 MG tablet Commonly known as: ZYRTEC Take 10 mg by mouth daily.   chlorthalidone 25 MG tablet Commonly known as: HYGROTON Take 1 tablet (25 mg total) by mouth daily.   clopidogrel 75 MG tablet Commonly known as: PLAVIX Take 1 tablet (75 mg total) by mouth daily.   diclofenac Sodium 1 % Gel Commonly known as: Voltaren Apply 2 g topically 4 (four) times daily.   gabapentin 100 MG capsule Commonly known as: NEURONTIN Take 1 capsule (100 mg total) by mouth 4 (four) times daily. What changed: when to take this Changed by: Elige Radon Prajna Vanderpool   hydrALAZINE 50 MG tablet Commonly known as: APRESOLINE Take by mouth.   hydrocortisone 2.5 % rectal cream Commonly known as: ANUSOL-HC Place 1 Application rectally 2 (two) times daily. As needed for rectal discomfort.   metoprolol succinate 50 MG 24 hr tablet Commonly known as: TOPROL-XL TAKE ONE TABLET BY MOUTH EVERY DAY IMMEDIATELY FOLLOWING A MEAL DAILY   montelukast 10 MG tablet Commonly known as: SINGULAIR Take 1 tablet (10 mg total) by mouth at bedtime.   multivitamin tablet Take 1 tablet by mouth daily.   nitroGLYCERIN 0.4 MG SL tablet Commonly known as: NITROSTAT Place 1 tablet under the tongue every 5 minutes x 3 doses as needed for chest pain (if no relief after 3rd dose, proceed to ED or call 911).   rosuvastatin 40 MG tablet Commonly known as: CRESTOR Take 1 tablet (40 mg total) by mouth daily.         Objective:   BP (!) 143/70   Pulse 72   Ht 5\' 1"  (1.549 m)   Wt 215 lb (97.5 kg)   SpO2 97%   BMI 40.62 kg/m   Wt Readings from Last 3 Encounters:  01/15/23 215 lb (97.5 kg)  01/13/23 216 lb 6.4 oz (98.2 kg)  01/01/23 216 lb 9.6 oz (98.2 kg)    Physical Exam Vitals and nursing note reviewed.  Constitutional:      General: She is  not in acute distress.    Appearance: She is well-developed. She is not diaphoretic.  Eyes:     Conjunctiva/sclera: Conjunctivae normal.  Cardiovascular:     Rate and Rhythm: Normal rate and regular rhythm.     Heart sounds: Normal heart sounds. No murmur heard. Pulmonary:     Effort: Pulmonary effort is normal. No respiratory distress.     Breath sounds: Normal breath sounds. No wheezing.  Musculoskeletal:  General: No swelling or tenderness. Normal range of motion.  Skin:    General: Skin is warm and dry.     Findings: No rash.  Neurological:     Mental Status: She is alert and oriented to person, place, and time.     Coordination: Coordination normal.  Psychiatric:        Behavior: Behavior normal.       Assessment & Plan:   Problem List Items Addressed This Visit       Cardiovascular and Mediastinum   Essential hypertension - Primary   Relevant Medications   chlorthalidone (HYGROTON) 25 MG tablet   Other Relevant Orders   CMP14+EGFR   Bayer DCA Hb A1c Waived   TSH     Endocrine   Subclinical hyperthyroidism   Relevant Orders   CMP14+EGFR   Bayer DCA Hb A1c Waived   TSH   Type 2 diabetes mellitus with diabetic chronic kidney disease (HCC)   Relevant Orders   Microalbumin / creatinine urine ratio   Bayer DCA Hb A1c Waived     Nervous and Auditory   Postherpetic neuralgia   Relevant Medications   gabapentin (NEURONTIN) 100 MG capsule     Genitourinary   CKD (chronic kidney disease), stage III (HCC)   Relevant Medications   chlorthalidone (HYGROTON) 25 MG tablet   Other Relevant Orders   CMP14+EGFR   Bayer DCA Hb A1c Waived   TSH     Other   Anxiety   Relevant Medications   buPROPion (WELLBUTRIN XL) 150 MG 24 hr tablet   Other Visit Diagnoses     Other atopic dermatitis       Relevant Medications   montelukast (SINGULAIR) 10 MG tablet       Will increase the gabapentin to 4 times a day instead of 3 times a day. We will also give her  muscle relaxer that she can use sometimes in the baclofen.  Also recommended that she could use her Voltaren gel on her lower back when she has issues with it.   Follow up plan: Return in about 3 months (around 04/17/2023), or if symptoms worsen or fail to improve, for Diabetes recheck.  Counseling provided for all of the vaccine components Orders Placed This Encounter  Procedures   Microalbumin / creatinine urine ratio   CMP14+EGFR   Bayer DCA Hb A1c Waived   TSH    Arville Care, MD [facility] 01/15/2023, 3:01 PM

## 2023-01-16 LAB — CMP14+EGFR
ALT: 8 IU/L (ref 0–32)
AST: 11 IU/L (ref 0–40)
Albumin: 3.8 g/dL (ref 3.7–4.7)
Alkaline Phosphatase: 110 IU/L (ref 44–121)
BUN/Creatinine Ratio: 18 (ref 12–28)
BUN: 32 mg/dL — ABNORMAL HIGH (ref 8–27)
Bilirubin Total: 0.2 mg/dL (ref 0.0–1.2)
CO2: 21 mmol/L (ref 20–29)
Calcium: 10 mg/dL (ref 8.7–10.3)
Chloride: 106 mmol/L (ref 96–106)
Creatinine, Ser: 1.75 mg/dL — ABNORMAL HIGH (ref 0.57–1.00)
Globulin, Total: 3.6 g/dL (ref 1.5–4.5)
Glucose: 86 mg/dL (ref 70–99)
Potassium: 4.3 mmol/L (ref 3.5–5.2)
Sodium: 141 mmol/L (ref 134–144)
Total Protein: 7.4 g/dL (ref 6.0–8.5)
eGFR: 29 mL/min/{1.73_m2} — ABNORMAL LOW (ref >59)

## 2023-01-16 LAB — MICROALBUMIN / CREATININE URINE RATIO
Creatinine, Urine: 67.1 mg/dL
Microalb/Creat Ratio: 175 mg/g{creat} — ABNORMAL HIGH (ref 0–29)
Microalbumin, Urine: 117.7 ug/mL

## 2023-01-16 LAB — TSH: TSH: 0.478 u[IU]/mL (ref 0.450–4.500)

## 2023-01-18 ENCOUNTER — Other Ambulatory Visit: Payer: Self-pay | Admitting: Family Medicine

## 2023-02-04 ENCOUNTER — Ambulatory Visit (HOSPITAL_COMMUNITY): Payer: Medicare Other

## 2023-03-01 ENCOUNTER — Other Ambulatory Visit: Payer: Self-pay | Admitting: Family Medicine

## 2023-03-03 ENCOUNTER — Encounter: Payer: Self-pay | Admitting: Gastroenterology

## 2023-03-12 ENCOUNTER — Ambulatory Visit (HOSPITAL_COMMUNITY): Payer: Medicare Other

## 2023-03-18 ENCOUNTER — Encounter: Payer: Self-pay | Admitting: Cardiology

## 2023-03-18 ENCOUNTER — Ambulatory Visit: Payer: Medicare Other | Attending: Cardiology | Admitting: Cardiology

## 2023-03-18 VITALS — BP 132/65 | HR 85 | Ht 62.0 in | Wt 222.0 lb

## 2023-03-18 DIAGNOSIS — I10 Essential (primary) hypertension: Secondary | ICD-10-CM

## 2023-03-18 DIAGNOSIS — Z79899 Other long term (current) drug therapy: Secondary | ICD-10-CM | POA: Diagnosis not present

## 2023-03-18 DIAGNOSIS — E782 Mixed hyperlipidemia: Secondary | ICD-10-CM

## 2023-03-18 DIAGNOSIS — I2510 Atherosclerotic heart disease of native coronary artery without angina pectoris: Secondary | ICD-10-CM | POA: Diagnosis not present

## 2023-03-18 DIAGNOSIS — R600 Localized edema: Secondary | ICD-10-CM | POA: Diagnosis not present

## 2023-03-18 MED ORDER — NITROGLYCERIN 0.4 MG SL SUBL: 0.4000 mg | SUBLINGUAL_TABLET | SUBLINGUAL | 3 refills | Status: DC | PRN

## 2023-03-18 NOTE — Patient Instructions (Addendum)
Medication Instructions:  Your physician recommends that you continue on your current medications as directed. Please refer to the Current Medication list given to you today.  Labwork: Your physician recommends that you return for a FASTING lipid profile. Please do not eat or drink for at least 8 hours when you have this done. You may take your medications that morning with a sip of water. [facility] or your family doctor's office  Testing/Procedures: none  Follow-Up: Your physician recommends that you schedule a follow-up appointment in: 6 months  Any Other Special Instructions Will Be Listed Below (If Applicable). You have been referred to the Lymphedema Clinic  If you need a refill on your cardiac medications before your next appointment, please call your pharmacy.

## 2023-03-18 NOTE — Progress Notes (Signed)
Clinical Summary Shelly [name] is a 82 y.o.female seen today for follow up of the following medical problems   1. CAD - history of NSTEMI, DES x 2 to RCA 08/2013 - she is on plavix for secondary prevention due to bleeding on ASA per Dr Junius Argyle notes   - no chest pains, no SOB/DOE - compliant with meds - EKG shows NSR     2. Hyperlipidemia [name] aches on atorvastatin, has tolerated crestor 5mg    07/2020 TC 186 TG 113 HDL 48 LDL 118 01/2022 RC 155 TG 99 HDL 46 LDL 91 - has been able to go up on crestor, now on 20mg  daily.    06/2022 TC 148 TG 91 HDL 49 LDL 82 High risk patient prior MI, age >85, HTN, CKD. Goal LDL would be <55 - 09/2022 TC 151 TG 96 HDL 53 LDL 80. This was about 4 weeks after we had increased her crestor   3. HTN - norvasc caused LE edema, though retried during 04/24/20 appt with PA Vincenza Hews - occasional leg swelling though infrequent   - 08/20/22 appt with renal high bp's. Norvasc and hydralazine were started, she states has not picked up or started this medications yet  - compliant with meds      4. Postherpetic neuralgia   5. CKD 3 - follwed by Dr Wolfgang Phoenix, reports appt coming.      6. Bilatearl LE edema - 08/2019 echo LVEF 65-70%, grade I dd, normal RV function, moderate pulm HTN PASP 40     -swelling is overall stable. 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 s/p NSTEMI in 2015 with DES x2 to RCA   Cataract    Chronic kidney disease    Coronary artery disease    SEVERE   GERD (gastroesophageal reflux disease)    GI bleed 12/2013   Goiter    Hyperlipidemia    Hypertension    Myocardial infarct [facility])    Myocardial infarction (HCC) 08/2013   Neuralgia of right lower extremity 07/13/2017   Pancolonic  diverticulosis 12/2013   Post herpetic neuralgia      Allergies  Allergen Reactions   Nsaids Other (See Comments)    AVOID all NSAID due to history of GI bleeding   Atorvastatin Other (See Comments)    Joint & muscle pain     Current Outpatient Medications  Medication Sig Dispense Refill   amitriptyline (ELAVIL) 25 MG tablet Take 1 tablet (25 mg total) by mouth at bedtime. 90 tablet 3   baclofen (LIORESAL) 10 MG tablet Take 1 tablet (10 mg total) by mouth 3 (three) times daily. 30 each 2   buPROPion (WELLBUTRIN XL) 150 MG 24 hr tablet Take 1 tablet (150 mg total) by mouth daily. 90 tablet 1   cetirizine (ZYRTEC) 10 MG tablet Take 10 mg by mouth daily.     chlorthalidone (HYGROTON) 25 MG tablet Take 1 tablet (25 mg total) by mouth daily. 90 tablet 3   clopidogrel (PLAVIX) 75 MG tablet TAKE 1 TABLET BY MOUTH ONCE DAILY 30 tablet 2   diclofenac Sodium (VOLTAREN) 1 % GEL Apply 2 g topically  4 (four) times daily. 350 g 3   gabapentin (NEURONTIN) 100 MG capsule Take 1 capsule (100 mg total) by mouth 4 (four) times daily. 360 capsule 3   hydrALAZINE (APRESOLINE) 50 MG tablet Take 50 mg by mouth 2 (two) times daily.     hydrocortisone (ANUSOL-HC) 2.5 % rectal cream Place 1 Application rectally 2 (two) times daily. As needed for rectal discomfort. 30 g 1   metoprolol succinate (TOPROL-XL) 50 MG 24 hr tablet TAKE ONE TABLET BY MOUTH EVERY DAY IMMEDIATELY FOLLOWING A MEAL DAILY 90 tablet 3   montelukast (SINGULAIR) 10 MG tablet Take 1 tablet (10 mg total) by mouth at bedtime. 90 tablet 1   Multiple Vitamin (MULTIVITAMIN) tablet Take 1 tablet by mouth daily.     nitroGLYCERIN (NITROSTAT) 0.4 MG SL tablet Place 1 tablet under the tongue every 5 minutes x 3 doses as needed for chest pain (if no relief after 3rd dose, proceed to ED or call 911). 25 tablet 3   rosuvastatin (CRESTOR) 40 MG tablet Take 1 tablet (40 mg total) by mouth daily. 90 tablet 3   No current facility-administered medications for  this visit.     Past Surgical History:  Procedure Laterality Date   CARPAL TUNNEL RELEASE Bilateral    COLONOSCOPY N/A 01/13/2014   Dr. Hung:pandiverticulosis    COLONOSCOPY N/A 12/14/2015   Dr. Bosie Clos: pancolonic diverticulosis, internal hemorrhoids    CORNEAL TRANSPLANT     CORONARY ANGIOPLASTY  08/2013   ESOPHAGOGASTRODUODENOSCOPY N/A 12/14/2015   Dr. Bosie Clos: normal    GIVENS CAPSULE STUDY N/A 08/13/2016   Dr. Darrick Penna: enteritis due to aspirin.    HEEL SPUR EXCISION     LEFT HEART CATHETERIZATION WITH CORONARY ANGIOGRAM N/A 08/23/2013   Procedure: LEFT HEART CATHETERIZATION WITH CORONARY ANGIOGRAM;  Surgeon: [name], MD;  Location: [facility] CATH LAB;  Service: Cardiovascular;  Laterality: N/A;   PERCUTANEOUS CORONARY STENT INTERVENTION (PCI-S) N/A 08/24/2013   Procedure: PERCUTANEOUS CORONARY STENT INTERVENTION (PCI-S);  Surgeon: [name], MD;  Location: [facility] CATH LAB;  Service: Cardiovascular;  Laterality: N/A;   TONSILLECTOMY     TUBAL LIGATION       Allergies  Allergen Reactions   Nsaids Other (See Comments)    AVOID all NSAID due to history of GI bleeding   Atorvastatin Other (See Comments)    Joint & muscle pain      Family History  Problem Relation Age of Onset   Cancer Mother        unsure of type    Other Father        grangrene    Asthma Sister    Stroke Sister    COPD Sister    Stroke Sister    Hypertension Brother    Transient ischemic attack Brother    Early death Brother    Early death Brother    Liver cancer Daughter    Cancer Daughter    Pancreatic disease Daughter        pancreatectomy   Cancer Daughter    Diverticulitis Daughter    Arthritis Daughter        knee    Arthritis Daughter        shoulder    Anemia Daughter    Hernia Son        Umbilical   GI problems Son    Liver cancer Son    GER disease Son    Colon cancer Neg Hx    Colon polyps Neg Hx  Social History Shelly [name] reports that she has never smoked. She has  never used smokeless tobacco. Shelly [name] reports no history of alcohol use.   Review of Systems CONSTITUTIONAL: No weight loss, fever, chills, weakness or fatigue.  HEENT: Eyes: No visual loss, blurred vision, double vision or yellow sclerae.No hearing loss, sneezing, congestion, runny nose or sore throat.  SKIN: No rash or itching.  CARDIOVASCULAR: per hpi RESPIRATORY: No shortness of breath, cough or sputum.  GASTROINTESTINAL: No anorexia, nausea, vomiting or diarrhea. No abdominal pain or blood.  GENITOURINARY: No burning on urination, no polyuria NEUROLOGICAL: No headache, dizziness, syncope, paralysis, ataxia, numbness or tingling in the extremities. No change in bowel or bladder control.  MUSCULOSKELETAL: No muscle, back pain, joint pain or stiffness.  LYMPHATICS: No enlarged nodes. No history of splenectomy.  PSYCHIATRIC: No history of depression or anxiety.  ENDOCRINOLOGIC: No reports of sweating, cold or heat intolerance. No polyuria or polydipsia.  Marland Kitchen   Physical Examination Today's Vitals   03/18/23 1554 03/18/23 1615  BP: 134/72 132/65  Pulse: 85   SpO2: 96%   Weight: 222 lb (100.7 kg)   Height: 5\' 2"  (1.575 m)    Body mass index is 40.6 kg/m.  Gen: resting comfortably, no acute distress HEENT: no scleral icterus, pupils equal round and reactive, no palptable cervical adenopathy,  CV: RRR, no m/rg, no jvd Resp: Clear to auscultation bilaterally GI: abdomen is soft, non-tender, non-distended, normal bowel sounds, no hepatosplenomegaly MSK: extremities are warm, no edema.  Skin: warm, no rash Neuro:  no focal deficits Psych: appropriate affect   Diagnostic Studies     Assessment and Plan  1. CAD - no recent symptoms, continue current meds   2. Hyperlipidemia -recheck lipid panel, goal LDL would be <55   3. HTN -at goal, continue current meds   4.Leg edema - looks to have a component of lymphedema as well, will refer to lymphedema  clinic   Antoine Poche, M.D.

## 2023-03-25 ENCOUNTER — Ambulatory Visit: Payer: Medicare Other | Admitting: Gastroenterology

## 2023-03-26 DIAGNOSIS — D631 Anemia in chronic kidney disease: Secondary | ICD-10-CM | POA: Diagnosis not present

## 2023-03-26 DIAGNOSIS — E1129 Type 2 diabetes mellitus with other diabetic kidney complication: Secondary | ICD-10-CM | POA: Diagnosis not present

## 2023-03-26 DIAGNOSIS — N189 Chronic kidney disease, unspecified: Secondary | ICD-10-CM | POA: Diagnosis not present

## 2023-03-26 DIAGNOSIS — R809 Proteinuria, unspecified: Secondary | ICD-10-CM | POA: Diagnosis not present

## 2023-03-26 DIAGNOSIS — E782 Mixed hyperlipidemia: Secondary | ICD-10-CM | POA: Diagnosis not present

## 2023-03-26 DIAGNOSIS — I5032 Chronic diastolic (congestive) heart failure: Secondary | ICD-10-CM | POA: Diagnosis not present

## 2023-03-26 DIAGNOSIS — Z79899 Other long term (current) drug therapy: Secondary | ICD-10-CM | POA: Diagnosis not present

## 2023-03-27 LAB — LIPID PANEL
Chol/HDL Ratio: 3 ratio (ref 0.0–4.4)
Cholesterol, Total: 164 mg/dL (ref 100–199)
HDL: 54 mg/dL (ref >39)
LDL Chol Calc (NIH): 91 mg/dL (ref 0–99)
Triglycerides: 105 mg/dL (ref 0–149)
VLDL Cholesterol Cal: 19 mg/dL (ref 5–40)

## 2023-04-07 ENCOUNTER — Ambulatory Visit: Payer: Medicare Other | Admitting: Nurse Practitioner

## 2023-04-07 DIAGNOSIS — E0590 Thyrotoxicosis, unspecified without thyrotoxic crisis or storm: Secondary | ICD-10-CM

## 2023-04-08 DIAGNOSIS — E0590 Thyrotoxicosis, unspecified without thyrotoxic crisis or storm: Secondary | ICD-10-CM | POA: Diagnosis not present

## 2023-04-09 ENCOUNTER — Ambulatory Visit: Payer: Medicare Other | Admitting: Gastroenterology

## 2023-04-09 LAB — T3, FREE: T3, Free: 2.5 pg/mL (ref 2.0–4.4)

## 2023-04-09 LAB — TSH: TSH: 0.682 u[IU]/mL (ref 0.450–4.500)

## 2023-04-09 LAB — T4, FREE: Free T4: 0.94 ng/dL (ref 0.82–1.77)

## 2023-04-10 ENCOUNTER — Ambulatory Visit (HOSPITAL_COMMUNITY): Payer: Medicare Other

## 2023-04-13 ENCOUNTER — Ambulatory Visit: Payer: Medicare Other | Admitting: Nurse Practitioner

## 2023-04-13 DIAGNOSIS — E0590 Thyrotoxicosis, unspecified without thyrotoxic crisis or storm: Secondary | ICD-10-CM

## 2023-04-20 ENCOUNTER — Ambulatory Visit: Payer: Medicare Other | Admitting: Family Medicine

## 2023-04-20 ENCOUNTER — Encounter: Payer: Self-pay | Admitting: Family Medicine

## 2023-04-20 VITALS — BP 161/70 | HR 74 | Ht 62.0 in | Wt 227.0 lb

## 2023-04-20 DIAGNOSIS — E1159 Type 2 diabetes mellitus with other circulatory complications: Secondary | ICD-10-CM

## 2023-04-20 DIAGNOSIS — N183 Chronic kidney disease, stage 3 unspecified: Secondary | ICD-10-CM | POA: Diagnosis not present

## 2023-04-20 DIAGNOSIS — I10 Essential (primary) hypertension: Secondary | ICD-10-CM | POA: Diagnosis not present

## 2023-04-20 DIAGNOSIS — N1831 Chronic kidney disease, stage 3a: Secondary | ICD-10-CM

## 2023-04-20 DIAGNOSIS — E0590 Thyrotoxicosis, unspecified without thyrotoxic crisis or storm: Secondary | ICD-10-CM | POA: Diagnosis not present

## 2023-04-20 DIAGNOSIS — N1832 Chronic kidney disease, stage 3b: Secondary | ICD-10-CM | POA: Diagnosis not present

## 2023-04-20 DIAGNOSIS — E1122 Type 2 diabetes mellitus with diabetic chronic kidney disease: Secondary | ICD-10-CM

## 2023-04-20 LAB — BAYER DCA HB A1C WAIVED: HB A1C (BAYER DCA - WAIVED): 5.6 % (ref 4.8–5.6)

## 2023-04-20 MED ORDER — BACLOFEN 10 MG PO TABS: 10.0000 mg | ORAL_TABLET | Freq: Three times a day (TID) | ORAL | 2 refills | Status: DC

## 2023-04-20 NOTE — Progress Notes (Signed)
BP (!) 161/70   Pulse 74   Ht 5\' 2"  (1.575 m)   Wt 227 lb (103 kg)   SpO2 97%   BMI 41.52 kg/m    Subjective:   Patient ID: Shelly [name], female    DOB: 1940-07-28, 82 y.o.   MRN: [id number]  HPI: Shelly [name] is a 82 y.o. female presenting on 04/20/2023 for Medical Management of Chronic Issues, Chronic Kidney Disease, and Hypertension  Type 2 diabetes mellitus Patient comes in today for a recheck of her diabetes. Patient is not currently taking any medications as her diabetes is diet-controlled. Patient has not seen an ophthalmologist this year. She has noticed some changes in her vision, so she is working on scheduling an appointment soon. Patient denies any new issues with her feet. She is having a harder time with foot care, so she is planning on establishing care with a podiatrist. Her diabetes is complicated by hypertension.   Hypertension Patient is currently taking chlorthalidone and metoprolol. Her blood pressure today is 136/57. She does occasionally experience lightheadedness with standing. She has to sit back down for a little while to recover. She did have a fall in October, but it was not the result of lightheadedness. She denies dizziness, headaches, chest pain, or shortness of breath.    Subclinical hyperthyroidism Patient is not currently taking any medications. She denies any hair or skin changes, heat or cold intolerance, diarrhea or constipation, and chest pain or palpitations.   Anxiety and postherpetic neuralgia Patient is currently taking bupropion, amitriptyline, gabapentin, baclofen, and hydralazine. She feels like her anxiety is well-controlled, so she does not want any medication adjustments. She continues to experience pain from the post-herpetic neuralgia. She awakens at night because the pain is severe, sometimes reaching at 9/10. The only medication that somewhat relieves the pain is the baclofen, which she ran out of a couple months ago. The  gabapentin makes her drowsy, so she cannot tolerate a higher dose.    04/20/2023    3:50 PM 01/15/2023    2:32 PM 10/27/2022    4:02 PM 09/11/2022    1:07 PM  GAD 7 : Generalized Anxiety Score  Nervous, Anxious, on Edge 0 0 0 0  Control/stop worrying 2 1 0 0  Worry too much - different things 2 1 1 1   Trouble relaxing 1 0 1 1  Restless 0 0 0 0  Easily annoyed or irritable 0 1 1 1   Afraid - awful might happen 1 0 1 1  Total GAD 7 Score 6 3 4 4   Anxiety Difficulty Somewhat difficult Not difficult at all Somewhat difficult Somewhat difficult        04/20/2023    3:50 PM 01/15/2023    2:31 PM 10/27/2022    4:01 PM  PHQ9 SCORE ONLY  PHQ-9 Total Score 8 9 7      Relevant past medical, surgical, family and social history reviewed and updated as indicated. Interim medical history since our last visit reviewed. Allergies and medications reviewed and updated.  Review of Systems  Constitutional:  Negative for chills, fatigue and fever.  HENT:  Negative for congestion, sinus pain and sore throat.   Eyes:  Positive for visual disturbance.  Respiratory:  Negative for cough, chest tightness and wheezing.   Cardiovascular:  Positive for leg swelling. Negative for chest pain.       Bilateral lower extremity edema that resolves overnight with leg elevation  Gastrointestinal:  Negative for abdominal pain, blood  in stool, constipation and diarrhea.  Endocrine: Negative for cold intolerance and heat intolerance.  Genitourinary:  Negative for difficulty urinating, dysuria and hematuria.  Musculoskeletal:  Positive for arthralgias and gait problem. Negative for myalgias.       Left knee osteoarthritis  Neurological:  Positive for light-headedness. Negative for dizziness, syncope, weakness, numbness and headaches.       Neuropathic pain on upper right back and shoulder  Psychiatric/Behavioral:  Negative for decreased concentration and sleep disturbance. The patient is not nervous/anxious.     Per HPI  unless specifically indicated above   Allergies as of 04/20/2023       Reactions   Nsaids Other (See Comments)   AVOID all NSAID due to history of GI bleeding   Atorvastatin Other (See Comments)   Joint & muscle pain        Medication List        Accurate as of April 20, 2023  4:48 PM. If you have any questions, ask your nurse or doctor.          amitriptyline 25 MG tablet Commonly known as: ELAVIL Take 1 tablet (25 mg total) by mouth at bedtime.   baclofen 10 MG tablet Commonly known as: LIORESAL Take 1 tablet (10 mg total) by mouth 3 (three) times daily.   buPROPion 150 MG 24 hr tablet Commonly known as: Wellbutrin XL Take 1 tablet (150 mg total) by mouth daily.   cetirizine 10 MG tablet Commonly known as: ZYRTEC Take 10 mg by mouth daily.   chlorthalidone 25 MG tablet Commonly known as: HYGROTON Take 1 tablet (25 mg total) by mouth daily.   clopidogrel 75 MG tablet Commonly known as: PLAVIX TAKE 1 TABLET BY MOUTH ONCE DAILY   diclofenac Sodium 1 % Gel Commonly known as: Voltaren Apply 2 g topically 4 (four) times daily.   gabapentin 100 MG capsule Commonly known as: NEURONTIN Take 1 capsule (100 mg total) by mouth 4 (four) times daily.   hydrALAZINE 50 MG tablet Commonly known as: APRESOLINE Take 50 mg by mouth 2 (two) times daily.   hydrocortisone 2.5 % rectal cream Commonly known as: ANUSOL-HC Place 1 Application rectally 2 (two) times daily. As needed for rectal discomfort.   metoprolol succinate 50 MG 24 hr tablet Commonly known as: TOPROL-XL TAKE ONE TABLET BY MOUTH EVERY DAY IMMEDIATELY FOLLOWING A MEAL DAILY   montelukast 10 MG tablet Commonly known as: SINGULAIR Take 1 tablet (10 mg total) by mouth at bedtime.   multivitamin tablet Take 1 tablet by mouth daily.   nitroGLYCERIN 0.4 MG SL tablet Commonly known as: NITROSTAT Place 1 tablet under the tongue every 5 minutes x 3 doses as needed for chest pain (if no relief after 3rd  dose, proceed to ED or call 911).   rosuvastatin 40 MG tablet Commonly known as: CRESTOR Take 1 tablet (40 mg total) by mouth daily.         Objective:   BP (!) 161/70   Pulse 74   Ht 5\' 2"  (1.575 m)   Wt 227 lb (103 kg)   SpO2 97%   BMI 41.52 kg/m   Wt Readings from Last 3 Encounters:  04/20/23 227 lb (103 kg)  03/18/23 222 lb (100.7 kg)  01/15/23 215 lb (97.5 kg)    Physical Exam Vitals and nursing note reviewed.  Constitutional:      General: She is not in acute distress.    Appearance: Normal appearance. She is obese.  HENT:  Head: Normocephalic and atraumatic.     Right Ear: External ear normal.     Left Ear: External ear normal.  Eyes:     Conjunctiva/sclera: Conjunctivae normal.  Cardiovascular:     Rate and Rhythm: Normal rate and regular rhythm.     Heart sounds: Normal heart sounds.  Pulmonary:     Effort: Pulmonary effort is normal.     Breath sounds: Normal breath sounds. No wheezing or rales.  Abdominal:     General: Abdomen is flat. There is no distension.     Palpations: Abdomen is soft.     Tenderness: There is no abdominal tenderness.  Musculoskeletal:     Cervical back: Normal range of motion and neck supple. No tenderness.     Right lower leg: Edema present.     Left lower leg: Edema present.     Comments: Trace bilateral lower extremity edema  Skin:    General: Skin is warm and dry.  Neurological:     Mental Status: She is alert and oriented to person, place, and time.     Gait: Gait abnormal.  Psychiatric:        Mood and Affect: Mood normal.        Behavior: Behavior normal.        Thought Content: Thought content normal.        Judgment: Judgment normal.    Results for orders placed or performed in visit on 04/20/23  Bayer DCA Hb A1c Waived  Result Value Ref Range   HB A1C (BAYER DCA - WAIVED) 5.6 4.8 - 5.6 %    Assessment & Plan:   Problem List Items Addressed This Visit       Cardiovascular and Mediastinum    Essential hypertension     Endocrine   Subclinical hyperthyroidism   Type 2 diabetes mellitus with diabetic chronic kidney disease (HCC)   Relevant Orders   CBC with Differential/Platelet   CMP14+EGFR   Lipid panel   Bayer DCA Hb A1c Waived (Completed)     Genitourinary   CKD (chronic kidney disease), stage III (HCC) - Primary   Relevant Orders   CBC with Differential/Platelet   CMP14+EGFR   Lipid panel   Bayer DCA Hb A1c Waived (Completed)    Blood pressure is well-controlled at 136/57. Will not make any antihypertensive medication changes at this time, but encouraged patient to notify us if her lightheadedness episodes become more frequent or result in falls. Diabetes is well-controlled with HgbA1c 5.6%. Patient declined diabetic foot exam today, so will need to complete at next visit. Patient denies any symptoms of hyperthyroidism. TSH is also within normal limits at 0.682, so will continue monitoring for symptoms and with periodic labs. Anxiety seems well-controlled. Will refill baclofen for post-herpetic neuralgia.  Follow up plan: Return in about 3 months (around 07/19/2023), or if symptoms worsen or fail to improve, for Diabetes recheck.  Counseling provided for all of the vaccine components Orders Placed This Encounter  Procedures   CBC with Differential/Platelet   CMP14+EGFR   Lipid panel   Bayer DCA Hb A1c Waived    Gillermina Phy, Medical Student [facility] 04/20/2023, 4:48 PM  Patient seen and examined with Gillermina Phy, medical student.  Agree with assessment and plan above.  Continue current dose of levothyroxine.  Blood pressure looks good. Arville Care, MD [facility] 04/24/2023, 11:25 AM

## 2023-04-21 LAB — LIPID PANEL
Chol/HDL Ratio: 3.3 {ratio} (ref 0.0–4.4)
Cholesterol, Total: 160 mg/dL (ref 100–199)
HDL: 49 mg/dL (ref >39)
LDL Chol Calc (NIH): 88 mg/dL (ref 0–99)
Triglycerides: 132 mg/dL (ref 0–149)
VLDL Cholesterol Cal: 23 mg/dL (ref 5–40)

## 2023-04-21 LAB — CBC WITH DIFFERENTIAL/PLATELET
Basophils Absolute: 0 10*3/uL (ref 0.0–0.2)
Basos: 1 %
EOS (ABSOLUTE): 0.1 10*3/uL (ref 0.0–0.4)
Eos: 1 %
Hematocrit: 31.6 % — ABNORMAL LOW (ref 34.0–46.6)
Hemoglobin: 10 g/dL — ABNORMAL LOW (ref 11.1–15.9)
Immature Grans (Abs): 0 10*3/uL (ref 0.0–0.1)
Immature Granulocytes: 0 %
Lymphocytes Absolute: 2.7 10*3/uL (ref 0.7–3.1)
Lymphs: 42 %
MCH: 30.6 pg (ref 26.6–33.0)
MCHC: 31.6 g/dL (ref 31.5–35.7)
MCV: 97 fL (ref 79–97)
Monocytes Absolute: 0.6 10*3/uL (ref 0.1–0.9)
Monocytes: 9 %
Neutrophils Absolute: 3 10*3/uL (ref 1.4–7.0)
Neutrophils: 47 %
Platelets: 268 10*3/uL (ref 150–450)
RBC: 3.27 x10E6/uL — ABNORMAL LOW (ref 3.77–5.28)
RDW: 13.6 % (ref 11.7–15.4)
WBC: 6.4 10*3/uL (ref 3.4–10.8)

## 2023-04-21 LAB — CMP14+EGFR
ALT: 11 [IU]/L (ref 0–32)
AST: 14 [IU]/L (ref 0–40)
Albumin: 3.7 g/dL (ref 3.7–4.7)
Alkaline Phosphatase: 90 [IU]/L (ref 44–121)
BUN/Creatinine Ratio: 21 (ref 12–28)
BUN: 35 mg/dL — ABNORMAL HIGH (ref 8–27)
Bilirubin Total: 0.3 mg/dL (ref 0.0–1.2)
CO2: 21 mmol/L (ref 20–29)
Calcium: 9.6 mg/dL (ref 8.7–10.3)
Chloride: 112 mmol/L — ABNORMAL HIGH (ref 96–106)
Creatinine, Ser: 1.64 mg/dL — ABNORMAL HIGH (ref 0.57–1.00)
Globulin, Total: 3.2 g/dL (ref 1.5–4.5)
Glucose: 78 mg/dL (ref 70–99)
Potassium: 4.6 mmol/L (ref 3.5–5.2)
Sodium: 145 mmol/L — ABNORMAL HIGH (ref 134–144)
Total Protein: 6.9 g/dL (ref 6.0–8.5)
eGFR: 31 mL/min/{1.73_m2} — ABNORMAL LOW (ref >59)

## 2023-04-23 ENCOUNTER — Telehealth: Payer: Self-pay | Admitting: Nurse Practitioner

## 2023-04-23 DIAGNOSIS — E0590 Thyrotoxicosis, unspecified without thyrotoxic crisis or storm: Secondary | ICD-10-CM

## 2023-04-23 NOTE — Telephone Encounter (Signed)
Yes, probably should.  Ill enter some.

## 2023-04-23 NOTE — Telephone Encounter (Signed)
Do you want pt to repeat labs before her march appt?

## 2023-04-23 NOTE — Telephone Encounter (Signed)
Spoke with pt , mailed her a new lab order

## 2023-04-24 ENCOUNTER — Telehealth: Payer: Self-pay | Admitting: *Deleted

## 2023-04-24 MED ORDER — EZETIMIBE 10 MG PO TABS: 10.0000 mg | ORAL_TABLET | Freq: Every day | ORAL | 6 refills | Status: DC

## 2023-04-24 NOTE — Telephone Encounter (Signed)
Lesle Chris, LPN 16/05/958 45:40 AM EST Back to Top    Notified, copy to pcp.  New medication sent to [facility] now.

## 2023-04-24 NOTE — Telephone Encounter (Signed)
-----   Message from Dina Rich sent at 04/14/2023  3:39 PM EST ----- Cholesterol remains above goal, please add zetia 10mg  daily to her current regimen.  Dominga Ferry MD

## 2023-04-28 ENCOUNTER — Ambulatory Visit (INDEPENDENT_AMBULATORY_CARE_PROVIDER_SITE_OTHER): Payer: Medicare Other | Admitting: Gastroenterology

## 2023-04-28 ENCOUNTER — Encounter: Payer: Self-pay | Admitting: Gastroenterology

## 2023-04-28 VITALS — BP 135/56 | HR 74 | Temp 98.7°F | Ht 62.0 in | Wt 227.6 lb

## 2023-04-28 DIAGNOSIS — K5900 Constipation, unspecified: Secondary | ICD-10-CM | POA: Diagnosis not present

## 2023-04-28 DIAGNOSIS — R634 Abnormal weight loss: Secondary | ICD-10-CM | POA: Diagnosis not present

## 2023-04-28 NOTE — Progress Notes (Signed)
Gastroenterology Office Note     Primary Care Physician:  [name], Elige Radon, MD  Primary Gastroenterologist: Dr. Jena Gauss    Chief Complaint   Chief Complaint  Patient presents with   Follow-up    Follow up on GERD and anemia     History of Present Illness   Shelly [name] is a very kind 82 y.o. female presenting today with a history of chronic GERD, diverticular bleed many years prior, IDA with capsule study showing enteritis due to aspirin use, and chronic normocytic anemia with chronic disease component.  She prefers frequent visits, although she is doing well. She has requested visits every 3 months.   She has had weight loss over the past year: 36 in Jan 2024, down to 216 in Aug 2024. Today 227, improved.  CT scan is scheduled for tomorrow due to ongoing weight loss.   As she has gained weight, we will cancel this. She lost her son due to cancer earlier this year and had decreased appetite. This is improving now. She would like to hold off on the scan unless she has further weight loss.   PPI use is very rare. Does well with dietary changes.    Multiple children with malignancies. No FH colon cancer or polyps. TSH had been low previously but now normalizing. LFTs normal.    Last colonoscopy 2017.    Past Medical History:  Diagnosis Date   Allergy    Anemia    GI bleed   Anginal pain (HCC)    Anxiety    Arthritis    Blood transfusion without reported diagnosis    CAD (coronary artery disease)    a. s/p NSTEMI in 2015 with DES x2 to RCA   Cataract    Chronic kidney disease    Coronary artery disease    SEVERE   GERD (gastroesophageal reflux disease)    GI bleed 12/2013   Goiter    Hyperlipidemia    Hypertension    Myocardial infarct [facility])    Myocardial infarction (HCC) 08/2013   Neuralgia of right lower extremity 07/13/2017   Pancolonic diverticulosis 12/2013   Post herpetic neuralgia     Past Surgical History:  Procedure Laterality Date    CARPAL TUNNEL RELEASE Bilateral    COLONOSCOPY N/A 01/13/2014   Dr. Hung:pandiverticulosis    COLONOSCOPY N/A 12/14/2015   Dr. Bosie Clos: pancolonic diverticulosis, internal hemorrhoids    CORNEAL TRANSPLANT     CORONARY ANGIOPLASTY  08/2013   ESOPHAGOGASTRODUODENOSCOPY N/A 12/14/2015   Dr. Bosie Clos: normal    GIVENS CAPSULE STUDY N/A 08/13/2016   Dr. Darrick Penna: enteritis due to aspirin.    HEEL SPUR EXCISION     LEFT HEART CATHETERIZATION WITH CORONARY ANGIOGRAM N/A 08/23/2013   Procedure: LEFT HEART CATHETERIZATION WITH CORONARY ANGIOGRAM;  Surgeon: [name], MD;  Location: [facility] CATH LAB;  Service: Cardiovascular;  Laterality: N/A;   PERCUTANEOUS CORONARY STENT INTERVENTION (PCI-S) N/A 08/24/2013   Procedure: PERCUTANEOUS CORONARY STENT INTERVENTION (PCI-S);  Surgeon: [name], MD;  Location: [facility] CATH LAB;  Service: Cardiovascular;  Laterality: N/A;   TONSILLECTOMY     TUBAL LIGATION      Current Outpatient Medications  Medication Sig Dispense Refill   amitriptyline (ELAVIL) 25 MG tablet Take 1 tablet (25 mg total) by mouth at bedtime. 90 tablet 3   baclofen (LIORESAL) 10 MG tablet Take 1 tablet (10 mg total) by mouth 3 (three) times daily. 90 each 2   buPROPion (WELLBUTRIN XL) 150  MG 24 hr tablet Take 1 tablet (150 mg total) by mouth daily. 90 tablet 1   cetirizine (ZYRTEC) 10 MG tablet Take 10 mg by mouth daily.     chlorthalidone (HYGROTON) 25 MG tablet Take 1 tablet (25 mg total) by mouth daily. 90 tablet 3   clopidogrel (PLAVIX) 75 MG tablet TAKE 1 TABLET BY MOUTH ONCE DAILY 30 tablet 2   diclofenac Sodium (VOLTAREN) 1 % GEL Apply 2 g topically 4 (four) times daily. 350 g 3   ezetimibe (ZETIA) 10 MG tablet Take 1 tablet (10 mg total) by mouth daily. 30 tablet 6   gabapentin (NEURONTIN) 100 MG capsule Take 1 capsule (100 mg total) by mouth 4 (four) times daily. 360 capsule 3   hydrALAZINE (APRESOLINE) 50 MG tablet Take 50 mg by mouth 2 (two) times daily.     hydrocortisone  (ANUSOL-HC) 2.5 % rectal cream Place 1 Application rectally 2 (two) times daily. As needed for rectal discomfort. 30 g 1   metoprolol succinate (TOPROL-XL) 50 MG 24 hr tablet TAKE ONE TABLET BY MOUTH EVERY DAY IMMEDIATELY FOLLOWING A MEAL DAILY 90 tablet 3   montelukast (SINGULAIR) 10 MG tablet Take 1 tablet (10 mg total) by mouth at bedtime. 90 tablet 1   Multiple Vitamin (MULTIVITAMIN) tablet Take 1 tablet by mouth daily.     nitroGLYCERIN (NITROSTAT) 0.4 MG SL tablet Place 1 tablet under the tongue every 5 minutes x 3 doses as needed for chest pain (if no relief after 3rd dose, proceed to ED or call 911). 25 tablet 3   rosuvastatin (CRESTOR) 40 MG tablet Take 1 tablet (40 mg total) by mouth daily. 90 tablet 3   No current facility-administered medications for this visit.    Allergies as of 04/28/2023 - Review Complete 04/28/2023  Allergen Reaction Noted   Nsaids Other (See Comments) 07/25/2016   Atorvastatin Other (See Comments) 11/30/2018    Family History  Problem Relation Age of Onset   Cancer Mother        unsure of type    Other Father        grangrene    Asthma Sister    Stroke Sister    COPD Sister    Stroke Sister    Hypertension Brother    Transient ischemic attack Brother    Early death Brother    Early death Brother    Liver cancer Daughter    Cancer Daughter    Pancreatic disease Daughter        pancreatectomy   Cancer Daughter    Diverticulitis Daughter    Arthritis Daughter        knee    Arthritis Daughter        shoulder    Anemia Daughter    Hernia Son        Umbilical   GI problems Son    Liver cancer Son    GER disease Son    Colon cancer Neg Hx    Colon polyps Neg Hx     Social History   Socioeconomic History   Marital status: Divorced    Spouse name: Not on file   Number of children: 8   Years of education: 11   Highest education level: 11th grade  Occupational History   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   No daily caffeine use.   2 story home but she only has to use one level   Social Determinants of Health   Financial Resource Strain: Low Risk  (08/08/2021)   Overall Financial Resource Strain (CARDIA)    Difficulty of Paying Living Expenses: Not hard at all  Food Insecurity: No Food Insecurity (08/08/2021)   Hunger Vital Sign    Worried About Running Out of Food in the Last Year: Never true    Ran Out of Food in the Last Year: Never true  Transportation Needs: No Transportation Needs (08/08/2021)   PRAPARE - Administrator, Civil Service (Medical): No    Lack of Transportation (Non-Medical): No  Physical Activity: Inactive (12/17/2021)   Exercise Vital Sign    Days of Exercise per Week: 0 days    Minutes of Exercise per Session: 0 min  Stress: Stress Concern Present (12/17/2021)   [employer] of Occupational Health - Occupational Stress Questionnaire    Feeling of Stress : To some extent  Social Connections: Socially Isolated (08/08/2021)   Social Connection and Isolation Panel [NHANES]    Frequency of Communication with Friends and Family: More than three times a week    Frequency of Social Gatherings with Friends and Family: More than three times a week    Attends Religious Services: Never    Database administrator or Organizations: No    Attends Banker Meetings: Never    Marital Status: Widowed  Intimate Partner Violence: Not At Risk (08/08/2021)    Humiliation, Afraid, Rape, and Kick questionnaire    Fear of Current or Ex-Partner: No    Emotionally Abused: No    Physically Abused: No    Sexually Abused: No     Review of Systems   Gen: Denies any fever, chills, fatigue, weight loss, lack of appetite.  CV: Denies chest pain, heart palpitations, peripheral edema, syncope.  Resp: Denies shortness of breath at rest or with exertion. Denies wheezing or cough.  GI: Denies dysphagia or odynophagia. Denies jaundice, hematemesis, fecal incontinence. GU : Denies urinary burning, urinary frequency, urinary hesitancy MS: Denies joint pain, muscle weakness, cramps, or limitation of movement.  Derm: Denies rash, itching, dry skin Psych: Denies depression, anxiety, memory loss, and confusion Heme: Denies bruising, bleeding, and enlarged lymph nodes.   Physical Exam   BP (!) 135/56   Pulse 74   Temp 98.7 F (37.1 C)   Ht 5\' 2"  (1.575 m)   Wt 227 lb 9.6 oz (103.2 kg)   BMI 41.63 kg/m  General:   Alert and oriented. Pleasant and cooperative. Well-nourished and well-developed.  Head:  Normocephalic and atraumatic. Eyes:  Without icterus Abdomen:  +BS, soft, non-tender and non-distended. No HSM noted. No guarding or rebound. No masses appreciated.  Rectal:  Deferred  Msk:  Symmetrical without gross deformities. Normal posture. Extremities:  chronic lymphedema Neurologic:  Alert and  oriented x4;  grossly normal neurologically. Skin:  Intact without significant lesions or rashes. Psych:  Alert and cooperative. Normal mood and affect.  Assessment   Shelly [name] is a very kind 82 y.o. female presenting today with a history of chronic GERD, diverticular bleed many years prior, IDA with capsule study showing enteritis due to aspirin use, and chronic normocytic anemia with chronic disease component.  She prefers frequent visits, although she is doing well. She has requested visits every 3 months.   Weight loss over the past year but  now improved; we will hold off on CT scan as suspect grief was playing a large role after the loss of her son. She desires to hold off on this as well. We will continue to follow her every 3 months at her request.   PPI use minimal, as she endorses dietary modification has been very helpful. Constipation also managed with diet.  She has had weight loss over the past year: 67 in Jan 2024, down to 216 in Aug 2024.     PLAN    Continue dietary modification Will see her in 3 months   Gelene Mink, PhD, ANP-BC [facility] Gastroenterology

## 2023-04-28 NOTE — Patient Instructions (Signed)
We will cancel the CT scan for tomorrow.  We will see you in 3 months!  I hope you have a wonderful holiday season and wonderful birthday!  I enjoyed seeing you again today! I value our relationship and want to provide genuine, compassionate, and quality care. You may receive a survey regarding your visit with me, and I welcome your feedback! Thanks so much for taking the time to complete this. I look forward to seeing you again.      Gelene Mink, PhD, ANP-BC [facility]astroenterology

## 2023-04-29 ENCOUNTER — Ambulatory Visit (HOSPITAL_COMMUNITY): Payer: Medicare Other

## 2023-05-05 DIAGNOSIS — E1129 Type 2 diabetes mellitus with other diabetic kidney complication: Secondary | ICD-10-CM | POA: Diagnosis not present

## 2023-05-05 DIAGNOSIS — R809 Proteinuria, unspecified: Secondary | ICD-10-CM | POA: Diagnosis not present

## 2023-05-05 DIAGNOSIS — E1122 Type 2 diabetes mellitus with diabetic chronic kidney disease: Secondary | ICD-10-CM | POA: Diagnosis not present

## 2023-05-05 DIAGNOSIS — I5032 Chronic diastolic (congestive) heart failure: Secondary | ICD-10-CM | POA: Diagnosis not present

## 2023-05-07 ENCOUNTER — Other Ambulatory Visit: Payer: Self-pay | Admitting: Family Medicine

## 2023-05-07 DIAGNOSIS — F419 Anxiety disorder, unspecified: Secondary | ICD-10-CM

## 2023-05-08 ENCOUNTER — Other Ambulatory Visit: Payer: Self-pay | Admitting: Family Medicine

## 2023-05-26 ENCOUNTER — Telehealth: Payer: Self-pay | Admitting: Family Medicine

## 2023-05-26 NOTE — Telephone Encounter (Signed)
 Copied from CRM [id number]. Topic: Clinical - Medical Advice >> May 26, 2023  3:51 PM Mosetta Putt H wrote: Reason for CRM:  Left Knee to toes is swollen and wants to know if can be prescribed medication or come in for appt

## 2023-05-26 NOTE — Telephone Encounter (Signed)
 Called to schedule apppointment and offered her 3 different appointmens for tommorow. She only wants to be seen with dr [name]. Dr [name] didn't have any appts available

## 2023-06-21 ENCOUNTER — Other Ambulatory Visit: Payer: Self-pay | Admitting: Cardiology

## 2023-07-20 ENCOUNTER — Ambulatory Visit: Payer: Medicare Other | Admitting: Family Medicine

## 2023-07-22 ENCOUNTER — Encounter: Payer: Self-pay | Admitting: Family Medicine

## 2023-07-22 DIAGNOSIS — E0590 Thyrotoxicosis, unspecified without thyrotoxic crisis or storm: Secondary | ICD-10-CM | POA: Diagnosis not present

## 2023-07-23 LAB — T3, FREE: T3, Free: 2.5 pg/mL (ref 2.0–4.4)

## 2023-07-23 LAB — T4, FREE: Free T4: 0.85 ng/dL (ref 0.82–1.77)

## 2023-07-23 LAB — TSH: TSH: 0.488 u[IU]/mL (ref 0.450–4.500)

## 2023-07-28 ENCOUNTER — Encounter: Payer: Self-pay | Admitting: Nurse Practitioner

## 2023-07-28 ENCOUNTER — Ambulatory Visit: Payer: Medicare Other | Admitting: Gastroenterology

## 2023-07-28 ENCOUNTER — Ambulatory Visit: Payer: Medicare Other | Admitting: Nurse Practitioner

## 2023-07-28 ENCOUNTER — Encounter: Payer: Self-pay | Admitting: Gastroenterology

## 2023-07-28 VITALS — BP 126/60 | HR 70 | Ht 62.0 in | Wt 229.6 lb

## 2023-07-28 DIAGNOSIS — E0590 Thyrotoxicosis, unspecified without thyrotoxic crisis or storm: Secondary | ICD-10-CM | POA: Diagnosis not present

## 2023-07-28 NOTE — Progress Notes (Signed)
 07/28/2023, 1:34 PM                                     Endocrinology follow-up note    Subjective:    Patient ID: Shelly [name], female    DOB: 08/06/1940, PCP [name], Elige Radon, MD   Past Medical History:  Diagnosis Date   Allergy    Anemia    GI bleed   Anginal pain (HCC)    Anxiety    Arthritis    Blood transfusion without reported diagnosis    CAD (coronary artery disease)    a. s/p NSTEMI in 2015 with DES x2 to RCA   Cataract    Chronic kidney disease    Coronary artery disease    SEVERE   GERD (gastroesophageal reflux disease)    GI bleed 12/2013   Goiter    Hyperlipidemia    Hypertension    Myocardial infarct [facility])    Myocardial infarction (HCC) 08/2013   Neuralgia of right lower extremity 07/13/2017   Pancolonic diverticulosis 12/2013   Post herpetic neuralgia    Past Surgical History:  Procedure Laterality Date   CARPAL TUNNEL RELEASE Bilateral    COLONOSCOPY N/A 01/13/2014   Dr. Hung:pandiverticulosis    COLONOSCOPY N/A 12/14/2015   Dr. Bosie Clos: pancolonic diverticulosis, internal hemorrhoids    CORNEAL TRANSPLANT     CORONARY ANGIOPLASTY  08/2013   ESOPHAGOGASTRODUODENOSCOPY N/A 12/14/2015   Dr. Bosie Clos: normal    GIVENS CAPSULE STUDY N/A 08/13/2016   Dr. Darrick Penna: enteritis due to aspirin.    HEEL SPUR EXCISION     LEFT HEART CATHETERIZATION WITH CORONARY ANGIOGRAM N/A 08/23/2013   Procedure: LEFT HEART CATHETERIZATION WITH CORONARY ANGIOGRAM;  Surgeon: [name], MD;  Location: [facility] CATH LAB;  Service: Cardiovascular;  Laterality: N/A;   PERCUTANEOUS CORONARY STENT INTERVENTION (PCI-S) N/A 08/24/2013   Procedure: PERCUTANEOUS CORONARY STENT INTERVENTION (PCI-S);  Surgeon: [name], MD;  Location: [facility] CATH LAB;  Service: Cardiovascular;  Laterality: N/A;   TONSILLECTOMY     TUBAL LIGATION     Social History   Socioeconomic History   Marital status: Divorced    Spouse name: Not on file    Number of children: 8   Years of education: 11   Highest education level: 11th grade  Occupational History   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   No daily caffeine use.   2 story home but she only has to use one level   Social Drivers of Corporate investment banker Strain: Low Risk  (  08/08/2021)   Overall Financial Resource Strain (CARDIA)    Difficulty of Paying Living Expenses: Not hard at all  Food Insecurity: No Food Insecurity (08/08/2021)   Hunger Vital Sign    Worried About Running Out of Food in the Last Year: Never true    Ran Out of Food in the Last Year: Never true  Transportation Needs: No Transportation Needs (08/08/2021)   PRAPARE - Administrator, Civil Service (Medical): No    Lack of Transportation (Non-Medical): No  Physical Activity: Inactive (12/17/2021)   Exercise Vital Sign    Days of Exercise per Week: 0 days    Minutes of Exercise per Session: 0 min  Stress: Stress Concern Present (12/17/2021)   [employer] of Occupational Health - Occupational Stress Questionnaire    Feeling of Stress : To some extent  Social Connections: Socially Isolated (08/08/2021)   Social Connection and Isolation Panel [NHANES]    Frequency of Communication with Friends and Family: More than three times a week    Frequency of Social Gatherings with Friends and Family: More than three times a week    Attends Religious Services: Never    Automotive engineer or Organizations: No    Attends Banker Meetings: Never    Marital Status: Widowed   Family History  Problem Relation Age of Onset   Cancer Mother        unsure of type    Other Father        grangrene    Asthma Sister    Stroke Sister    COPD Sister    Stroke Sister    Hypertension Brother    Transient ischemic attack Brother    Early death Brother    Early death Brother    Liver cancer Daughter    Cancer Daughter    Pancreatic disease Daughter        pancreatectomy   Cancer Daughter    Diverticulitis Daughter    Arthritis Daughter        knee    Arthritis Daughter        shoulder    Anemia Daughter    Hernia Son        Umbilical   GI problems Son    Liver cancer Son    GER disease Son    Colon cancer Neg Hx    Colon polyps Neg Hx    Outpatient Encounter Medications as of 07/28/2023  Medication Sig   amitriptyline (ELAVIL) 25 MG tablet Take 1 tablet (25 mg total) by mouth at bedtime.   baclofen (LIORESAL) 10 MG tablet Take 1 tablet (10 mg total) by mouth 3 (three) times daily.   buPROPion (WELLBUTRIN XL) 150 MG 24 hr tablet TAKE 1 TABLET(150 MG) BY MOUTH DAILY   cetirizine (ZYRTEC) 10 MG tablet Take 10 mg by mouth daily.   chlorthalidone (HYGROTON) 25 MG tablet Take 1 tablet (25 mg total) by mouth daily.   clopidogrel (PLAVIX) 75 MG tablet TAKE 1 TABLET(75 MG) BY MOUTH DAILY   diclofenac Sodium (VOLTAREN) 1 % GEL Apply 2 g topically 4 (four) times daily.   gabapentin (NEURONTIN) 100 MG capsule Take 1 capsule (100 mg total) by mouth 4 (four) times daily.   hydrALAZINE (APRESOLINE) 50 MG tablet Take 50 mg by mouth 2 (two) times daily.   hydrocortisone (ANUSOL-HC) 2.5 % rectal cream Place 1 Application rectally 2 (two) times daily. As needed for rectal discomfort.   metoprolol succinate (TOPROL-XL)  50 MG 24 hr tablet TAKE ONE TABLET BY MOUTH EVERY DAY IMMEDIATELY FOLLOWING A MEAL   montelukast (SINGULAIR) 10 MG tablet Take 1 tablet (10 mg  total) by mouth at bedtime.   Multiple Vitamin (MULTIVITAMIN) tablet Take 1 tablet by mouth daily.   nitroGLYCERIN (NITROSTAT) 0.4 MG SL tablet DISSOLVE ONE TABLET UNDER TONGUE AS NEEDED FOR CHEST PAIN FOR 3 DOSES AND IF NO RELIEF PROCEED TO ER OR CALL 911   rosuvastatin (CRESTOR) 40 MG tablet Take 1 tablet (40 mg total) by mouth daily.   ezetimibe (ZETIA) 10 MG tablet Take 1 tablet (10 mg total) by mouth daily.   No facility-administered encounter medications on file as of 07/28/2023.   ALLERGIES: Allergies  Allergen Reactions   Nsaids Other (See Comments)    AVOID all NSAID due to history of GI bleeding   Atorvastatin Other (See Comments)    Joint & muscle pain    VACCINATION STATUS: Immunization History  Administered Date(s) Administered   Pneumococcal Conjugate-13 10/02/2016   Pneumococcal Polysaccharide-23 08/25/2013   Tdap 07/14/2022   Zoster Recombinant(Shingrix) 03/17/2018    Thyroid Problem Presents for follow-up visit. Patient reports no anxiety, cold intolerance, constipation, depressed mood, fatigue, heat intolerance, leg swelling, palpitations, tremors, weight gain or weight loss. The symptoms have been stable.    Shelly [name] is 83 y.o. female who is seen in follow-up after she was seen in consultation for subclinical hyperthyroidism.    PMD: [name], Elige Radon, MD.  Due to septic symptoms of thyrotoxicosis, she was started on low-dose methimazole, which was since tapered down.  She has been off the Methimazole for some time now with stable thyroid response.  She denies any history of goiter, no family history of thyroid dysfunction or thyroid malignancy.  Her thyroid uptake and scan on June 14, 2019 showed 24-hour uptake of 10.3% which is normal.  There was asymmetric thyroid activity with generally increased uptake in the right lobe.  No focal hot or cold nodules identified.  Her medical history includes hypertension and hyperlipidemia on  treatment.  Review of systems  Constitutional: + steadily decreasing body weight,  current Body mass index is 41.99 kg/m. , no fatigue, no subjective hyperthermia, no subjective hypothermia Eyes: no blurry vision, no xerophthalmia ENT: no sore throat, no nodules palpated in throat, no dysphagia/odynophagia, no hoarseness Cardiovascular: no chest pain, no shortness of breath, no palpitations, no leg swelling Respiratory: no cough, no shortness of breath Gastrointestinal: no nausea/vomiting/diarrhea Musculoskeletal: no muscle/joint aches Skin: no rashes, no hyperemia Neurological: no tremors, no numbness, no tingling, no dizziness Psychiatric: no depression, no anxiety  Objective:    BP 126/60 (BP Location: Left Arm, Patient Position: Sitting, Cuff Size: Large)   Pulse 70   Ht 5\' 2"  (1.575 m)   Wt 229 lb 9.6 oz (104.1 kg)   BMI 41.99 kg/m   Wt Readings from Last 3 Encounters:  07/28/23 229 lb 9.6 oz (104.1 kg)  04/28/23 227 lb 9.6 oz (103.2 kg)  04/20/23 227 lb (103 kg)    BP Readings from Last 3 Encounters:  07/28/23 126/60  04/28/23 (!) 135/56  04/20/23 (!) 161/70      Physical Exam- Limited  Constitutional:  Body mass index is 41.99 kg/m. , not in acute distress, normal state of mind Eyes:  EOMI, no exophthalmos Musculoskeletal: no gross deformities, strength intact in all four extremities, no gross restriction of joint movements Skin:  no rashes, no hyperemia Neurological: no tremor with outstretched hands  CMP     Component Value Date/Time   NA 145 (H) 04/20/2023 1552   K 4.6 04/20/2023 1552   CL 112 (H) 04/20/2023 1552   CO2 21 04/20/2023 1552   GLUCOSE 78 04/20/2023 1552   GLUCOSE 114 (H) 04/05/2021 1319   BUN 35 (H) 04/20/2023 1552   CREATININE 1.64 (H) 04/20/2023 1552   CREATININE 1.31 (H) 12/21/2018 1547   CALCIUM 9.6 04/20/2023 1552   PROT 6.9 04/20/2023 1552   ALBUMIN 3.7 04/20/2023 1552   AST 14 04/20/2023 1552   ALT 11 04/20/2023 1552    ALKPHOS 90 04/20/2023 1552   BILITOT 0.3 04/20/2023 1552   GFRNONAA 32 (L) 04/05/2021 1319   GFRAA 34 (L) 04/30/2020 1510     Diabetic Labs (most recent): Lab Results  Component Value Date   HGBA1C 5.6 04/20/2023   HGBA1C 6.2 (H) 01/15/2023   HGBA1C 6.4 (H) 10/15/2022   MICROALBUR 18.6 (H) 04/05/2021     Lipid Panel ( most recent) Lipid Panel     Component Value Date/Time   CHOL 160 04/20/2023 1552   TRIG 132 04/20/2023 1552   HDL 49 04/20/2023 1552   CHOLHDL 3.3 04/20/2023 1552   CHOLHDL 2.6 11/20/2016 0854   VLDL 11 11/20/2016 0854   LDLCALC 88 04/20/2023 1552   LABVLDL 23 04/20/2023 1552      Lab Results  Component Value Date   TSH 0.488 07/22/2023   TSH 0.682 04/08/2023   TSH 0.478 01/15/2023   TSH 0.723 12/03/2022   TSH 0.069 (L) 08/18/2022   TSH 0.175 (L) 05/21/2022   TSH 0.134 (L) 02/03/2022   TSH 0.287 (L) 01/31/2022   TSH 0.057 (L) 10/30/2021   TSH 0.062 (L) 10/17/2021   FREET4 0.85 07/22/2023   FREET4 0.94 04/08/2023   FREET4 1.06 12/03/2022   FREET4 1.03 08/18/2022   FREET4 0.98 05/21/2022   FREET4 1.11 02/03/2022   FREET4 1.11 10/17/2021   FREET4 0.96 07/24/2021   FREET4 0.88 04/02/2021   FREET4 1.02 10/16/2020     Latest Reference Range & Units 05/21/22 14:50 08/18/22 14:37 12/03/22 16:56 01/15/23 15:22 04/08/23 15:53 07/22/23 16:17  TSH 0.450 - 4.500 uIU/mL 0.175 (L) 0.069 (L) 0.723 0.478 0.682 0.488  Triiodothyronine,Free,Serum 2.0 - 4.4 pg/mL 2.8 2.7 2.9  2.5 2.5  T4,Free(Direct) 0.82 - 1.77 ng/dL 4.62 7.03 5.00  9.38 1.82  (L): Data is abnormally low  Assessment & Plan:   1. Subclinical hyperthyroidism  -Her most recent thyroid function tests are once again stable, TSH now low end of normal and again FT3 and FT4 are normal.  There is no need for antithyroid treatment at this time.   Will monitor TFTs again in 3 months (patient prefers to have more frequent checkups than the 6 month I offered her).    - she is advised to maintain  close follow up with [name], Elige Radon, MD for primary care needs.    I spent  24  minutes in the care of the patient today including review of labs from Thyroid Function, CMP, and other relevant labs ; imaging/biopsy records (current and previous including abstractions from other facilities); face-to-face time discussing  her lab results and symptoms, medications doses, her options of short and long term treatment based on the latest standards of care / guidelines;   and documenting the encounter.  Shelly [name]  participated in the discussions, expressed understanding, and voiced agreement with the above plans.  All questions were answered to her satisfaction. she is  encouraged to contact clinic should she have any questions or concerns prior to her return visit.   Follow up plan: Return in about 4 months (around 11/27/2023) for Thyroid follow up, Previsit labs.   Ronny Bacon, [facility] [address] [place], [place] [address] Phone: [phone] Fax: [phone]   07/28/2023, 1:34 PM

## 2023-07-31 ENCOUNTER — Ambulatory Visit (INDEPENDENT_AMBULATORY_CARE_PROVIDER_SITE_OTHER): Admitting: Family Medicine

## 2023-07-31 ENCOUNTER — Encounter: Payer: Self-pay | Admitting: Family Medicine

## 2023-07-31 VITALS — BP 104/58 | HR 77 | Ht 62.0 in | Wt 227.0 lb

## 2023-07-31 DIAGNOSIS — B0229 Other postherpetic nervous system involvement: Secondary | ICD-10-CM

## 2023-07-31 DIAGNOSIS — N183 Chronic kidney disease, stage 3 unspecified: Secondary | ICD-10-CM | POA: Diagnosis not present

## 2023-07-31 DIAGNOSIS — E0590 Thyrotoxicosis, unspecified without thyrotoxic crisis or storm: Secondary | ICD-10-CM | POA: Diagnosis not present

## 2023-07-31 DIAGNOSIS — I10 Essential (primary) hypertension: Secondary | ICD-10-CM

## 2023-07-31 DIAGNOSIS — N1832 Chronic kidney disease, stage 3b: Secondary | ICD-10-CM | POA: Diagnosis not present

## 2023-07-31 DIAGNOSIS — F419 Anxiety disorder, unspecified: Secondary | ICD-10-CM

## 2023-07-31 DIAGNOSIS — L2089 Other atopic dermatitis: Secondary | ICD-10-CM

## 2023-07-31 DIAGNOSIS — E1122 Type 2 diabetes mellitus with diabetic chronic kidney disease: Secondary | ICD-10-CM

## 2023-07-31 DIAGNOSIS — N1831 Chronic kidney disease, stage 3a: Secondary | ICD-10-CM | POA: Diagnosis not present

## 2023-07-31 LAB — BAYER DCA HB A1C WAIVED: HB A1C (BAYER DCA - WAIVED): 5.8 % — ABNORMAL HIGH (ref 4.8–5.6)

## 2023-07-31 MED ORDER — MONTELUKAST SODIUM 10 MG PO TABS: 10.0000 mg | ORAL_TABLET | Freq: Every day | ORAL | 3 refills | Status: AC

## 2023-07-31 MED ORDER — BUPROPION HCL ER (XL) 150 MG PO TB24
150.0000 mg | ORAL_TABLET | Freq: Every day | ORAL | 3 refills | Status: DC
Start: 2023-07-31 — End: 2024-02-04

## 2023-07-31 MED ORDER — AMITRIPTYLINE HCL 25 MG PO TABS: 25.0000 mg | ORAL_TABLET | Freq: Every day | ORAL | 3 refills | Status: DC

## 2023-07-31 MED ORDER — CLOPIDOGREL BISULFATE 75 MG PO TABS: 75.0000 mg | ORAL_TABLET | Freq: Every day | ORAL | 3 refills | Status: AC

## 2023-07-31 NOTE — Progress Notes (Signed)
 BP (!) 104/58   Pulse 77   Ht 5\' 2"  (1.575 m)   Wt 227 lb (103 kg)   SpO2 97%   BMI 41.52 kg/m    Subjective:   Patient ID: Shelly [name], female    DOB: [birth year], 83 y.o.   MRN: [id number]  HPI: Shelly [name] is a 83 y.o. female presenting on 07/31/2023 for Medical Management of Chronic Issues, Chronic Kidney Disease, Diabetes, Hypertension, and Anxiety   HPI Type 2 diabetes mellitus Patient comes in today for recheck of his diabetes. Patient has been currently taking no medication, diet control. Patient is not currently on an ACE inhibitor/ARB. Patient has not seen an ophthalmologist this year. Patient denies any new issues with their feet. The symptom started onset as an adult subclinical hypothyroidism and hypertension ARE RELATED TO DM   Subclinical hyper thyroidism recheck Patient is coming in for thyroid recheck today as well. They deny any issues with hair changes or heat or cold problems or diarrhea or constipation. They deny any chest pain or palpitations.  Hypertension Patient is currently on chlorthalidone and hydralazine and metoprolol and sees cardiology for this, and their blood pressure today is 104/58. Patient denies any lightheadedness or dizziness. Patient denies headaches, blurred vision, chest pains, shortness of breath, or weakness. Denies any side effects from medication and is content with current medication.   Postherpetic neuralgia and anxiety Patient has postherpetic neuralgia and anxiety and takes gabapentin and amitriptyline at night and she feels like they do help some but not completely.  She still does have some issues with it.  She denies any major side effects from the medicine but just feels that it is not helping 100%.  She says she wakes up about once per night.  Relevant past medical, surgical, family and social history reviewed and updated as indicated. Interim medical history since our last visit reviewed. Allergies and medications  reviewed and updated.  Review of Systems  Constitutional:  Negative for chills and fever.  HENT:  Negative for congestion, ear discharge and ear pain.   Eyes:  Negative for redness and visual disturbance.  Respiratory:  Negative for chest tightness and shortness of breath.   Cardiovascular:  Negative for chest pain and leg swelling.  Genitourinary:  Negative for difficulty urinating and dysuria.  Musculoskeletal:  Negative for back pain and gait problem.  Skin:  Negative for rash.  Neurological:  Negative for dizziness, light-headedness and headaches.  Psychiatric/Behavioral:  Negative for agitation and behavioral problems.   All other systems reviewed and are negative.   Per HPI unless specifically indicated above   Allergies as of 07/31/2023       Reactions   Nsaids Other (See Comments)   AVOID all NSAID due to history of GI bleeding   Atorvastatin Other (See Comments)   Joint & muscle pain        Medication List        Accurate as of July 31, 2023  3:20 PM. If you have any questions, ask your nurse or doctor.          amitriptyline 25 MG tablet Commonly known as: ELAVIL Take 1 tablet (25 mg total) by mouth at bedtime.   baclofen 10 MG tablet Commonly known as: LIORESAL Take 1 tablet (10 mg total) by mouth 3 (three) times daily.   buPROPion 150 MG 24 hr tablet Commonly known as: WELLBUTRIN XL Take 1 tablet (150 mg total) by mouth daily. What changed: See the  new instructions. Changed by: Elige Radon Patterson Hollenbaugh   cetirizine 10 MG tablet Commonly known as: ZYRTEC Take 10 mg by mouth daily.   chlorthalidone 25 MG tablet Commonly known as: HYGROTON Take 1 tablet (25 mg total) by mouth daily.   clopidogrel 75 MG tablet Commonly known as: PLAVIX Take 1 tablet (75 mg total) by mouth daily. What changed: See the new instructions. Changed by: Elige Radon Tiersa Dayley   diclofenac Sodium 1 % Gel Commonly known as: Voltaren Apply 2 g topically 4 (four) times  daily.   ezetimibe 10 MG tablet Commonly known as: ZETIA Take 1 tablet (10 mg total) by mouth daily.   gabapentin 100 MG capsule Commonly known as: NEURONTIN Take 1 capsule (100 mg total) by mouth 4 (four) times daily.   hydrALAZINE 50 MG tablet Commonly known as: APRESOLINE Take 50 mg by mouth 2 (two) times daily.   hydrocortisone 2.5 % rectal cream Commonly known as: ANUSOL-HC Place 1 Application rectally 2 (two) times daily. As needed for rectal discomfort.   metoprolol succinate 50 MG 24 hr tablet Commonly known as: TOPROL-XL TAKE ONE TABLET BY MOUTH EVERY DAY IMMEDIATELY FOLLOWING A MEAL   montelukast 10 MG tablet Commonly known as: SINGULAIR Take 1 tablet (10 mg total) by mouth at bedtime.   multivitamin tablet Take 1 tablet by mouth daily.   nitroGLYCERIN 0.4 MG SL tablet Commonly known as: NITROSTAT DISSOLVE ONE TABLET UNDER TONGUE AS NEEDED FOR CHEST PAIN FOR 3 DOSES AND IF NO RELIEF PROCEED TO ER OR CALL 911   rosuvastatin 40 MG tablet Commonly known as: CRESTOR Take 1 tablet (40 mg total) by mouth daily.         Objective:   BP (!) 104/58   Pulse 77   Ht 5\' 2"  (1.575 m)   Wt 227 lb (103 kg)   SpO2 97%   BMI 41.52 kg/m   Wt Readings from Last 3 Encounters:  07/31/23 227 lb (103 kg)  07/28/23 229 lb 9.6 oz (104.1 kg)  04/28/23 227 lb 9.6 oz (103.2 kg)    Physical Exam Vitals and nursing note reviewed.  Constitutional:      General: She is not in acute distress.    Appearance: She is well-developed. She is not diaphoretic.  Eyes:     Conjunctiva/sclera: Conjunctivae normal.     Pupils: Pupils are equal, round, and reactive to light.  Cardiovascular:     Rate and Rhythm: Normal rate and regular rhythm.     Heart sounds: Normal heart sounds. No murmur heard. Pulmonary:     Effort: Pulmonary effort is normal. No respiratory distress.     Breath sounds: Normal breath sounds. No wheezing.  Musculoskeletal:        General: No tenderness. Normal  range of motion.  Skin:    General: Skin is warm and dry.     Findings: No rash.  Neurological:     Mental Status: She is alert and oriented to person, place, and time.     Coordination: Coordination normal.  Psychiatric:        Behavior: Behavior normal.       Assessment & Plan:   Problem List Items Addressed This Visit       Cardiovascular and Mediastinum   Essential hypertension   Relevant Orders   CBC with Differential/Platelet   CMP14+EGFR   Lipid panel   Bayer DCA Hb A1c Waived     Endocrine   Subclinical hyperthyroidism   Type 2 diabetes  mellitus with diabetic chronic kidney disease (HCC)   Relevant Orders   CBC with Differential/Platelet   CMP14+EGFR   Lipid panel   Bayer DCA Hb A1c Waived     Nervous and Auditory   Postherpetic neuralgia   Relevant Medications   amitriptyline (ELAVIL) 25 MG tablet     Genitourinary   CKD (chronic kidney disease), stage III (HCC) - Primary   Relevant Orders   CBC with Differential/Platelet   CMP14+EGFR   Lipid panel   Bayer DCA Hb A1c Waived     Other   Anxiety   Relevant Medications   amitriptyline (ELAVIL) 25 MG tablet   buPROPion (WELLBUTRIN XL) 150 MG 24 hr tablet   Other Visit Diagnoses       Other atopic dermatitis       Relevant Medications   montelukast (SINGULAIR) 10 MG tablet     Continue current medicine, no changes, seems to be doing okay.  Follow up plan: Return in about 3 months (around 10/31/2023), or if symptoms worsen or fail to improve, for Diabetes and thyroid.  Counseling provided for all of the vaccine components Orders Placed This Encounter  Procedures   CBC with Differential/Platelet   CMP14+EGFR   Lipid panel   Bayer DCA Hb A1c Waived    Arville Care, MD [facility] 07/31/2023, 3:20 PM

## 2023-08-01 LAB — CBC WITH DIFFERENTIAL/PLATELET
Basophils Absolute: 0 10*3/uL (ref 0.0–0.2)
Basos: 1 %
EOS (ABSOLUTE): 0.1 10*3/uL (ref 0.0–0.4)
Eos: 1 %
Hematocrit: 32.7 % — ABNORMAL LOW (ref 34.0–46.6)
Hemoglobin: 10.4 g/dL — ABNORMAL LOW (ref 11.1–15.9)
Immature Grans (Abs): 0 10*3/uL (ref 0.0–0.1)
Immature Granulocytes: 0 %
Lymphocytes Absolute: 2.8 10*3/uL (ref 0.7–3.1)
Lymphs: 41 %
MCH: 30.6 pg (ref 26.6–33.0)
MCHC: 31.8 g/dL (ref 31.5–35.7)
MCV: 96 fL (ref 79–97)
Monocytes Absolute: 1.1 10*3/uL — ABNORMAL HIGH (ref 0.1–0.9)
Monocytes: 17 %
Neutrophils Absolute: 2.6 10*3/uL (ref 1.4–7.0)
Neutrophils: 40 %
Platelets: 256 10*3/uL (ref 150–450)
RBC: 3.4 x10E6/uL — ABNORMAL LOW (ref 3.77–5.28)
RDW: 13.1 % (ref 11.7–15.4)
WBC: 6.6 10*3/uL (ref 3.4–10.8)

## 2023-08-01 LAB — LIPID PANEL
Chol/HDL Ratio: 2.9 ratio (ref 0.0–4.4)
Cholesterol, Total: 149 mg/dL (ref 100–199)
HDL: 52 mg/dL (ref >39)
LDL Chol Calc (NIH): 83 mg/dL (ref 0–99)
Triglycerides: 68 mg/dL (ref 0–149)
VLDL Cholesterol Cal: 14 mg/dL (ref 5–40)

## 2023-08-01 LAB — CMP14+EGFR
ALT: 9 IU/L (ref 0–32)
AST: 15 IU/L (ref 0–40)
Albumin: 3.7 g/dL (ref 3.7–4.7)
Alkaline Phosphatase: 88 IU/L (ref 44–121)
BUN/Creatinine Ratio: 15 (ref 12–28)
BUN: 35 mg/dL — ABNORMAL HIGH (ref 8–27)
Bilirubin Total: 0.3 mg/dL (ref 0.0–1.2)
CO2: 19 mmol/L — ABNORMAL LOW (ref 20–29)
Calcium: 9.9 mg/dL (ref 8.7–10.3)
Chloride: 110 mmol/L — ABNORMAL HIGH (ref 96–106)
Creatinine, Ser: 2.41 mg/dL — ABNORMAL HIGH (ref 0.57–1.00)
Globulin, Total: 3.1 g/dL (ref 1.5–4.5)
Glucose: 77 mg/dL (ref 70–99)
Potassium: 4.9 mmol/L (ref 3.5–5.2)
Sodium: 144 mmol/L (ref 134–144)
Total Protein: 6.8 g/dL (ref 6.0–8.5)
eGFR: 20 mL/min/{1.73_m2} — ABNORMAL LOW (ref >59)

## 2023-08-03 ENCOUNTER — Encounter (HOSPITAL_COMMUNITY): Payer: Self-pay | Admitting: Emergency Medicine

## 2023-08-03 ENCOUNTER — Emergency Department (HOSPITAL_COMMUNITY)

## 2023-08-03 ENCOUNTER — Encounter: Payer: Self-pay | Admitting: Family Medicine

## 2023-08-03 ENCOUNTER — Other Ambulatory Visit: Payer: Self-pay

## 2023-08-03 ENCOUNTER — Emergency Department (HOSPITAL_COMMUNITY)
Admission: EM | Admit: 2023-08-03 | Discharge: 2023-08-04 | Disposition: A | Discharge status: Home / Self Care | Attending: Emergency Medicine | Admitting: Emergency Medicine

## 2023-08-03 DIAGNOSIS — S0240FA Zygomatic fracture, left side, initial encounter for closed fracture: Secondary | ICD-10-CM | POA: Diagnosis not present

## 2023-08-03 DIAGNOSIS — S02832A Fracture of medial orbital wall, left side, initial encounter for closed fracture: Secondary | ICD-10-CM | POA: Diagnosis not present

## 2023-08-03 DIAGNOSIS — R609 Edema, unspecified: Secondary | ICD-10-CM | POA: Diagnosis not present

## 2023-08-03 DIAGNOSIS — N189 Chronic kidney disease, unspecified: Secondary | ICD-10-CM | POA: Diagnosis not present

## 2023-08-03 DIAGNOSIS — S0240DA Maxillary fracture, left side, initial encounter for closed fracture: Secondary | ICD-10-CM | POA: Diagnosis not present

## 2023-08-03 DIAGNOSIS — E1122 Type 2 diabetes mellitus with diabetic chronic kidney disease: Secondary | ICD-10-CM | POA: Insufficient documentation

## 2023-08-03 DIAGNOSIS — S0532XA Ocular laceration without prolapse or loss of intraocular tissue, left eye, initial encounter: Secondary | ICD-10-CM | POA: Diagnosis not present

## 2023-08-03 DIAGNOSIS — S0232XA Fracture of orbital floor, left side, initial encounter for closed fracture: Secondary | ICD-10-CM | POA: Diagnosis not present

## 2023-08-03 DIAGNOSIS — Z79899 Other long term (current) drug therapy: Secondary | ICD-10-CM | POA: Insufficient documentation

## 2023-08-03 DIAGNOSIS — M47816 Spondylosis without myelopathy or radiculopathy, lumbar region: Secondary | ICD-10-CM | POA: Diagnosis not present

## 2023-08-03 DIAGNOSIS — N3090 Cystitis, unspecified without hematuria: Secondary | ICD-10-CM | POA: Insufficient documentation

## 2023-08-03 DIAGNOSIS — S0990XA Unspecified injury of head, initial encounter: Secondary | ICD-10-CM | POA: Diagnosis present

## 2023-08-03 DIAGNOSIS — W19XXXA Unspecified fall, initial encounter: Secondary | ICD-10-CM

## 2023-08-03 DIAGNOSIS — I2510 Atherosclerotic heart disease of native coronary artery without angina pectoris: Secondary | ICD-10-CM | POA: Insufficient documentation

## 2023-08-03 DIAGNOSIS — S3993XA Unspecified injury of pelvis, initial encounter: Secondary | ICD-10-CM | POA: Diagnosis not present

## 2023-08-03 DIAGNOSIS — S0285XA Fracture of orbit, unspecified, initial encounter for closed fracture: Secondary | ICD-10-CM | POA: Insufficient documentation

## 2023-08-03 DIAGNOSIS — Y92019 Unspecified place in single-family (private) house as the place of occurrence of the external cause: Secondary | ICD-10-CM | POA: Insufficient documentation

## 2023-08-03 DIAGNOSIS — R0689 Other abnormalities of breathing: Secondary | ICD-10-CM | POA: Diagnosis not present

## 2023-08-03 DIAGNOSIS — I129 Hypertensive chronic kidney disease with stage 1 through stage 4 chronic kidney disease, or unspecified chronic kidney disease: Secondary | ICD-10-CM | POA: Diagnosis not present

## 2023-08-03 DIAGNOSIS — N1832 Chronic kidney disease, stage 3b: Secondary | ICD-10-CM

## 2023-08-03 DIAGNOSIS — R002 Palpitations: Secondary | ICD-10-CM | POA: Diagnosis not present

## 2023-08-03 DIAGNOSIS — S02401A Maxillary fracture, unspecified, initial encounter for closed fracture: Secondary | ICD-10-CM | POA: Insufficient documentation

## 2023-08-03 DIAGNOSIS — R918 Other nonspecific abnormal finding of lung field: Secondary | ICD-10-CM | POA: Diagnosis not present

## 2023-08-03 DIAGNOSIS — S02842A Fracture of lateral orbital wall, left side, initial encounter for closed fracture: Secondary | ICD-10-CM | POA: Diagnosis not present

## 2023-08-03 DIAGNOSIS — Z743 Need for continuous supervision: Secondary | ICD-10-CM | POA: Diagnosis not present

## 2023-08-03 DIAGNOSIS — Z7901 Long term (current) use of anticoagulants: Secondary | ICD-10-CM | POA: Insufficient documentation

## 2023-08-03 DIAGNOSIS — R410 Disorientation, unspecified: Secondary | ICD-10-CM | POA: Diagnosis not present

## 2023-08-03 DIAGNOSIS — S299XXA Unspecified injury of thorax, initial encounter: Secondary | ICD-10-CM | POA: Diagnosis not present

## 2023-08-03 DIAGNOSIS — W07XXXA Fall from chair, initial encounter: Secondary | ICD-10-CM | POA: Insufficient documentation

## 2023-08-03 MED ORDER — ACETAMINOPHEN 325 MG PO TABS
650.0000 mg | ORAL_TABLET | Freq: Once | ORAL | Status: AC
  Administered 2023-08-03: 650 mg via ORAL
  Filled 2023-08-03: qty 2

## 2023-08-03 NOTE — ED Provider Notes (Signed)
 [place] EMERGENCY DEPARTMENT AT [facility] Provider Note   CSN: [id number] Arrival date & time: 08/03/23  2321     History {Add pertinent medical, surgical, social history, OB history to HPI:1} Chief Complaint  Patient presents with   Shelly [name] is a 83 y.o. female.   Fall Associated symptoms include headaches.  Patient presents after fall.  Fall occurred at home.  It was unwitnessed.  It is suspected that she slid out of a rolling chair.  She has pain and a small laceration to left lateral periorbital area.  EMS noted normal vital signs during transit.  They also noted the patient had repetitive questioning during transit.  Currently, patient endorses a headache and area of pain on left upper gums, but denies any other areas of new pain.  She is on Plavix.     Home Medications Prior to Admission medications   Medication Sig Start Date End Date Taking? Authorizing Provider  amitriptyline (ELAVIL) 25 MG tablet Take 1 tablet (25 mg total) by mouth at bedtime. 07/31/23   [name], Elige Radon, MD  baclofen (LIORESAL) 10 MG tablet Take 1 tablet (10 mg total) by mouth 3 (three) times daily. 04/20/23   [name], Elige Radon, MD  buPROPion (WELLBUTRIN XL) 150 MG 24 hr tablet Take 1 tablet (150 mg total) by mouth daily. 07/31/23   [name], Elige Radon, MD  cetirizine (ZYRTEC) 10 MG tablet Take 10 mg by mouth daily.    [provider]  chlorthalidone (HYGROTON) 25 MG tablet Take 1 tablet (25 mg total) by mouth daily. 01/15/23   [name], Elige Radon, MD  clopidogrel (PLAVIX) 75 MG tablet Take 1 tablet (75 mg total) by mouth daily. 07/31/23   [name], Elige Radon, MD  diclofenac Sodium (VOLTAREN) 1 % GEL Apply 2 g topically 4 (four) times daily. 11/01/20   [name], Elige Radon, MD  ezetimibe (ZETIA) 10 MG tablet Take 1 tablet (10 mg total) by mouth daily. 04/24/23 07/23/23  Antoine Poche, MD  gabapentin (NEURONTIN) 100 MG capsule Take 1 capsule (100 mg total) by  mouth 4 (four) times daily. 01/15/23   [name], Elige Radon, MD  hydrALAZINE (APRESOLINE) 50 MG tablet Take 50 mg by mouth 2 (two) times daily. 08/20/22 08/20/23  [provider]  hydrocortisone (ANUSOL-HC) 2.5 % rectal cream Place 1 Application rectally 2 (two) times daily. As needed for rectal discomfort. 06/26/22   Gelene Mink, NP  metoprolol succinate (TOPROL-XL) 50 MG 24 hr tablet TAKE ONE TABLET BY MOUTH EVERY DAY IMMEDIATELY FOLLOWING A MEAL 06/22/23   Antoine Poche, MD  montelukast (SINGULAIR) 10 MG tablet Take 1 tablet (10 mg total) by mouth at bedtime. 07/31/23   [name], Elige Radon, MD  Multiple Vitamin (MULTIVITAMIN) tablet Take 1 tablet by mouth daily.    [provider]  nitroGLYCERIN (NITROSTAT) 0.4 MG SL tablet DISSOLVE ONE TABLET UNDER TONGUE AS NEEDED FOR CHEST PAIN FOR 3 DOSES AND IF NO RELIEF PROCEED TO ER OR CALL 911 06/22/23   Antoine Poche, MD  rosuvastatin (CRESTOR) 40 MG tablet Take 1 tablet (40 mg total) by mouth daily. 09/10/22   Antoine Poche, MD      Allergies    Nsaids and Atorvastatin    Review of Systems   Review of Systems  Neurological:  Positive for headaches.  All other systems reviewed and are negative.   Physical Exam Updated Vital Signs Ht 5\' 2"  (1.575 m)   Wt  103 kg   BMI 41.53 kg/m  Physical Exam Vitals and nursing note reviewed.  Constitutional:      General: She is not in acute distress.    Appearance: Normal appearance. She is well-developed. She is not ill-appearing, toxic-appearing or diaphoretic.  HENT:     Head: Normocephalic.     Comments: Area of bruising and small laceration to left lateral periorbital area.    Right Ear: External ear normal.     Left Ear: External ear normal.     Nose: Nose normal.     Mouth/Throat:     Mouth: Mucous membranes are moist.  Eyes:     Extraocular Movements: Extraocular movements intact.     Conjunctiva/sclera: Conjunctivae normal.     Pupils: Pupils are equal, round, and  reactive to light.  Neck:     Comments: Cervical collar in place Cardiovascular:     Rate and Rhythm: Normal rate and regular rhythm.     Heart sounds: No murmur heard. Pulmonary:     Effort: Pulmonary effort is normal. No respiratory distress.     Breath sounds: Normal breath sounds. No wheezing or rales.  Chest:     Chest wall: No tenderness.  Abdominal:     General: There is no distension.     Palpations: Abdomen is soft.     Tenderness: There is no abdominal tenderness.  Musculoskeletal:        General: No swelling or deformity. Normal range of motion.     Cervical back: Neck supple.  Skin:    General: Skin is warm and dry.     Coloration: Skin is not jaundiced or pale.  Neurological:     General: No focal deficit present.     Mental Status: She is alert and oriented to person, place, and time.  Psychiatric:        Mood and Affect: Mood normal.        Behavior: Behavior normal.     ED Results / Procedures / Treatments   Labs (all labs ordered are listed, but only abnormal results are displayed) Labs Reviewed - No data to display  EKG None  Radiology No results found.  Procedures Procedures  {Document cardiac monitor, telemetry assessment procedure when appropriate:1}  Medications Ordered in ED Medications - No data to display  ED Course/ Medical Decision Making/ A&P   {   Click here for ABCD2, HEART and other calculatorsREFRESH Note before signing :1}                              Medical Decision Making  This patient presents to the ED for concern of ***, this involves an extensive number of treatment options, and is a complaint that carries with it a high risk of complications and morbidity.  The differential diagnosis includes ***   Co morbidities that complicate the patient evaluation  ***   Additional history obtained:  Additional history obtained from *** External records from outside source obtained and reviewed including ***   Lab  Tests:  I Ordered, and personally interpreted labs.  The pertinent results include:  ***   Imaging Studies ordered:  I ordered imaging studies including ***  I independently visualized and interpreted imaging which showed *** I agree with the radiologist interpretation   Cardiac Monitoring: / EKG:  The patient was maintained on a cardiac monitor.  I personally viewed and interpreted the cardiac monitored which showed an  underlying rhythm of: ***   Consultations Obtained:  I requested consultation with the ***,  and discussed lab and imaging findings as well as pertinent plan - they recommend: ***   Problem List / ED Course / Critical interventions / Medication management  Patient presenting after unwitnessed fall at home.  On arrival, she is alert and oriented.  EMS did note repetitive questioning during transit.  She has evidence of minor trauma to left lateral periorbital area.  She is on Plavix.  Imaging studies were ordered.  Tylenol was ordered for analgesia.***. I ordered medication including ***  for ***  Reevaluation of the patient after these medicines showed that the patient {resolved/improved/worsened:23923::"improved"} I have reviewed the patients home medicines and have made adjustments as needed   Social Determinants of Health:  ***   Test / Admission - Considered:  ***   {Document critical care time when appropriate:1} {Document review of labs and clinical decision tools ie heart score, Chads2Vasc2 etc:1}  {Document your independent review of radiology images, and any outside records:1} {Document your discussion with family members, caretakers, and with consultants:1} {Document social determinants of health affecting pt's care:1} {Document your decision making why or why not admission, treatments were needed:1} Final Clinical Impression(s) / ED Diagnoses Final diagnoses:  None    Rx / DC Orders ED Discharge Orders     None

## 2023-08-03 NOTE — ED Triage Notes (Signed)
 Pt fell out of rolling chair in her bedroom. Pt has lac to the left eye. Pt denied any loc.

## 2023-08-04 ENCOUNTER — Emergency Department (HOSPITAL_COMMUNITY)

## 2023-08-04 DIAGNOSIS — S299XXA Unspecified injury of thorax, initial encounter: Secondary | ICD-10-CM | POA: Diagnosis not present

## 2023-08-04 DIAGNOSIS — M47816 Spondylosis without myelopathy or radiculopathy, lumbar region: Secondary | ICD-10-CM | POA: Diagnosis not present

## 2023-08-04 DIAGNOSIS — S0240DA Maxillary fracture, left side, initial encounter for closed fracture: Secondary | ICD-10-CM | POA: Diagnosis not present

## 2023-08-04 DIAGNOSIS — S3993XA Unspecified injury of pelvis, initial encounter: Secondary | ICD-10-CM | POA: Diagnosis not present

## 2023-08-04 DIAGNOSIS — S0532XA Ocular laceration without prolapse or loss of intraocular tissue, left eye, initial encounter: Secondary | ICD-10-CM | POA: Diagnosis not present

## 2023-08-04 DIAGNOSIS — R918 Other nonspecific abnormal finding of lung field: Secondary | ICD-10-CM | POA: Diagnosis not present

## 2023-08-04 DIAGNOSIS — S0285XA Fracture of orbit, unspecified, initial encounter for closed fracture: Secondary | ICD-10-CM | POA: Diagnosis not present

## 2023-08-04 DIAGNOSIS — S0240FA Zygomatic fracture, left side, initial encounter for closed fracture: Secondary | ICD-10-CM | POA: Diagnosis not present

## 2023-08-04 LAB — CBC
HCT: 34.6 % — ABNORMAL LOW (ref 36.0–46.0)
Hemoglobin: 10.6 g/dL — ABNORMAL LOW (ref 12.0–15.0)
MCH: 30.4 pg (ref 26.0–34.0)
MCHC: 30.6 g/dL (ref 30.0–36.0)
MCV: 99.1 fL (ref 80.0–100.0)
Platelets: 262 10*3/uL (ref 150–400)
RBC: 3.49 MIL/uL — ABNORMAL LOW (ref 3.87–5.11)
RDW: 13.5 % (ref 11.5–15.5)
WBC: 6.3 10*3/uL (ref 4.0–10.5)
nRBC: 0 % (ref 0.0–0.2)

## 2023-08-04 LAB — COMPREHENSIVE METABOLIC PANEL
ALT: 11 U/L (ref 0–44)
AST: 17 U/L (ref 15–41)
Albumin: 3.3 g/dL — ABNORMAL LOW (ref 3.5–5.0)
Alkaline Phosphatase: 70 U/L (ref 38–126)
Anion gap: 9 (ref 5–15)
BUN: 42 mg/dL — ABNORMAL HIGH (ref 8–23)
CO2: 23 mmol/L (ref 22–32)
Calcium: 9.5 mg/dL (ref 8.9–10.3)
Chloride: 109 mmol/L (ref 98–111)
Creatinine, Ser: 2.13 mg/dL — ABNORMAL HIGH (ref 0.44–1.00)
GFR, Estimated: 23 mL/min — ABNORMAL LOW (ref >60)
Glucose, Bld: 123 mg/dL — ABNORMAL HIGH (ref 70–99)
Potassium: 4.4 mmol/L (ref 3.5–5.1)
Sodium: 141 mmol/L (ref 135–145)
Total Bilirubin: 0.2 mg/dL (ref 0.0–1.2)
Total Protein: 7.4 g/dL (ref 6.5–8.1)

## 2023-08-04 LAB — URINALYSIS, ROUTINE W REFLEX MICROSCOPIC
Bilirubin Urine: NEGATIVE
Glucose, UA: NEGATIVE mg/dL
Hgb urine dipstick: NEGATIVE
Ketones, ur: NEGATIVE mg/dL
Nitrite: NEGATIVE
Protein, ur: NEGATIVE mg/dL
Specific Gravity, Urine: 1.011 (ref 1.005–1.030)
pH: 6 (ref 5.0–8.0)

## 2023-08-04 LAB — MAGNESIUM: Magnesium: 2.6 mg/dL — ABNORMAL HIGH (ref 1.7–2.4)

## 2023-08-04 LAB — ETHANOL: Alcohol, Ethyl (B): <10 mg/dL (ref <10)

## 2023-08-04 MED ORDER — CEPHALEXIN 500 MG PO CAPS
500.0000 mg | ORAL_CAPSULE | Freq: Once | ORAL | Status: AC
  Administered 2023-08-04: 500 mg via ORAL
  Filled 2023-08-04: qty 1

## 2023-08-04 MED ORDER — DIPHENHYDRAMINE HCL 25 MG PO CAPS
25.0000 mg | ORAL_CAPSULE | Freq: Once | ORAL | Status: AC
  Administered 2023-08-04: 25 mg via ORAL
  Filled 2023-08-04: qty 1

## 2023-08-04 MED ORDER — CEPHALEXIN 500 MG PO CAPS: 500.0000 mg | ORAL_CAPSULE | Freq: Three times a day (TID) | ORAL | 0 refills | Status: AC

## 2023-08-04 MED ORDER — OXYCODONE-ACETAMINOPHEN 5-325 MG PO TABS
1.0000 | ORAL_TABLET | Freq: Once | ORAL | Status: AC
  Administered 2023-08-04: 1 via ORAL
  Filled 2023-08-04: qty 1

## 2023-08-04 MED ORDER — METOCLOPRAMIDE HCL 10 MG PO TABS
10.0000 mg | ORAL_TABLET | Freq: Once | ORAL | Status: AC
  Administered 2023-08-04: 10 mg via ORAL
  Filled 2023-08-04: qty 1

## 2023-08-04 NOTE — ED Notes (Signed)
 ED Provider at bedside.

## 2023-08-04 NOTE — ED Notes (Signed)
Patient transported to X-ray & CT °

## 2023-08-04 NOTE — Discharge Instructions (Addendum)
 Your CT scan showed the following injuries: "1. Comminuted depressed fractures of the anterior and posterior left maxillary sinus wall. 2. Nondisplaced fracture of the left zygomatic arch. 3. Comminuted mildly displaced fracture of the medial and lateral walls of the left orbit. Depressed comminuted fracture of the inferior wall of the left orbit. No evidence of entrapment."  These injuries should heal on their own. Call the telephone number below to set up a follow-up appointment with the ear, nose, throat/face trauma doctor for reevaluation. Take Tylenol as needed for pain.   Additionally, your urine does look infected.  A prescription for antibiotics was sent to your pharmacy.  This is for treatment of a urine infection.  Take as prescribed.     A cardiology referral has been ordered.  If you do not hear from their office, call the telephone number below to follow-up with them as well.  Return to the emergency department for any new or worsening symptoms of concern.

## 2023-08-05 LAB — URINE CULTURE: Culture: <10000 — AB

## 2023-08-06 ENCOUNTER — Other Ambulatory Visit

## 2023-08-07 ENCOUNTER — Other Ambulatory Visit

## 2023-08-07 DIAGNOSIS — N1832 Chronic kidney disease, stage 3b: Secondary | ICD-10-CM | POA: Diagnosis not present

## 2023-08-08 LAB — BMP8+EGFR
BUN/Creatinine Ratio: 15 (ref 12–28)
BUN: 34 mg/dL — ABNORMAL HIGH (ref 8–27)
CO2: 18 mmol/L — ABNORMAL LOW (ref 20–29)
Calcium: 9.5 mg/dL (ref 8.7–10.3)
Chloride: 113 mmol/L — ABNORMAL HIGH (ref 96–106)
Creatinine, Ser: 2.22 mg/dL — ABNORMAL HIGH (ref 0.57–1.00)
Glucose: 96 mg/dL (ref 70–99)
Potassium: 4.7 mmol/L (ref 3.5–5.2)
Sodium: 145 mmol/L — ABNORMAL HIGH (ref 134–144)
eGFR: 22 mL/min/{1.73_m2} — ABNORMAL LOW (ref >59)

## 2023-08-10 ENCOUNTER — Encounter: Payer: Self-pay | Admitting: Family Medicine

## 2023-08-12 ENCOUNTER — Other Ambulatory Visit: Payer: Self-pay

## 2023-08-12 DIAGNOSIS — R42 Dizziness and giddiness: Secondary | ICD-10-CM

## 2023-08-12 DIAGNOSIS — R55 Syncope and collapse: Secondary | ICD-10-CM

## 2023-08-12 DIAGNOSIS — R296 Repeated falls: Secondary | ICD-10-CM

## 2023-08-13 ENCOUNTER — Telehealth (INDEPENDENT_AMBULATORY_CARE_PROVIDER_SITE_OTHER): Payer: Self-pay | Admitting: Physician Assistant

## 2023-08-13 NOTE — Telephone Encounter (Signed)
 Left vm to confirm appt date and location for 08/14/2023.

## 2023-08-14 ENCOUNTER — Ambulatory Visit (INDEPENDENT_AMBULATORY_CARE_PROVIDER_SITE_OTHER): Admitting: Physician Assistant

## 2023-08-14 DIAGNOSIS — S02842D Fracture of lateral orbital wall, left side, subsequent encounter for fracture with routine healing: Secondary | ICD-10-CM

## 2023-08-14 DIAGNOSIS — S0240DD Maxillary fracture, left side, subsequent encounter for fracture with routine healing: Secondary | ICD-10-CM | POA: Diagnosis not present

## 2023-08-14 DIAGNOSIS — S02832D Fracture of medial orbital wall, left side, subsequent encounter for fracture with routine healing: Secondary | ICD-10-CM

## 2023-08-14 DIAGNOSIS — S0292XA Unspecified fracture of facial bones, initial encounter for closed fracture: Secondary | ICD-10-CM

## 2023-08-14 DIAGNOSIS — S0240FD Zygomatic fracture, left side, subsequent encounter for fracture with routine healing: Secondary | ICD-10-CM | POA: Diagnosis not present
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
## 2023-08-17 ENCOUNTER — Encounter (INDEPENDENT_AMBULATORY_CARE_PROVIDER_SITE_OTHER): Payer: Self-pay | Admitting: Physician Assistant

## 2023-08-25 ENCOUNTER — Ambulatory Visit (INDEPENDENT_AMBULATORY_CARE_PROVIDER_SITE_OTHER): Admitting: Gastroenterology

## 2023-08-25 VITALS — BP 143/78 | HR 109 | Temp 97.8°F | Ht 62.0 in | Wt 224.6 lb

## 2023-08-25 DIAGNOSIS — R809 Proteinuria, unspecified: Secondary | ICD-10-CM | POA: Diagnosis not present

## 2023-08-25 DIAGNOSIS — D631 Anemia in chronic kidney disease: Secondary | ICD-10-CM | POA: Diagnosis not present

## 2023-08-25 DIAGNOSIS — K219 Gastro-esophageal reflux disease without esophagitis: Secondary | ICD-10-CM

## 2023-08-25 DIAGNOSIS — N189 Chronic kidney disease, unspecified: Secondary | ICD-10-CM | POA: Diagnosis not present

## 2023-08-25 MED ORDER — HYDROCORTISONE (PERIANAL) 2.5 % EX CREA: 1.0000 | TOPICAL_CREAM | Freq: Two times a day (BID) | CUTANEOUS | 1 refills | Status: DC

## 2023-08-25 NOTE — Patient Instructions (Signed)
 I am glad you are doing well!  We will see you in 3 months!  Always good to see you!  I enjoyed seeing you again today! I value our relationship and want to provide genuine, compassionate, and quality care. You may receive a survey regarding your visit with me, and I welcome your feedback! Thanks so much for taking the time to complete this. I look forward to seeing you again.      Gelene Mink, PhD, ANP-BC [facility]

## 2023-08-25 NOTE — Progress Notes (Signed)
 Gastroenterology Office Note     Primary Care Physician:  [name], Elige Radon, MD  Primary Gastroenterologist: Dr. Jena Gauss    Chief Complaint   Chief Complaint  Patient presents with   Follow-up    Pt says had a bad fall last Monday night. No problems with stomach and weight loss has gotten better.     History of Present Illness   Shelly [name] is a very kind 83 y.o. female presenting today with a history of chronic GERD, diverticular bleed many years prior, IDA with capsule study showing enteritis due to aspirin use, and chronic normocytic anemia with chronic disease component.  She prefers frequent visits, although she is doing well. She has requested visits every 3 months.   Pantoprazole on hand if needed. Took just one time this year. Takes sparingly.   Fractures of maxillary area after fall last month. She has seen ENT.   No constipation, overt GI bleeding, abdominal pain, N/V, dysphagia.   227 in Dec, 224 today. Fluctuating after review of labs.    Past Medical History:  Diagnosis Date   Allergy    Anemia    GI bleed   Anginal pain (HCC)    Anxiety    Arthritis    Blood transfusion without reported diagnosis    CAD (coronary artery disease)    a. s/p NSTEMI in 2015 with DES x2 to RCA   Cataract    Chronic kidney disease    Coronary artery disease    SEVERE   GERD (gastroesophageal reflux disease)    GI bleed 12/2013   Goiter    Hyperlipidemia    Hypertension    Myocardial infarct [facility])    Myocardial infarction (HCC) 08/2013   Neuralgia of right lower extremity 07/13/2017   Pancolonic diverticulosis 12/2013   Post herpetic neuralgia     Past Surgical History:  Procedure Laterality Date   CARPAL TUNNEL RELEASE Bilateral    COLONOSCOPY N/A 01/13/2014   Dr. Hung:pandiverticulosis    COLONOSCOPY N/A 12/14/2015   Dr. Bosie Clos: pancolonic diverticulosis, internal hemorrhoids    CORNEAL TRANSPLANT     CORONARY ANGIOPLASTY  08/2013    ESOPHAGOGASTRODUODENOSCOPY N/A 12/14/2015   Dr. Bosie Clos: normal    GIVENS CAPSULE STUDY N/A 08/13/2016   Dr. Darrick Penna: enteritis due to aspirin.    HEEL SPUR EXCISION     LEFT HEART CATHETERIZATION WITH CORONARY ANGIOGRAM N/A 08/23/2013   Procedure: LEFT HEART CATHETERIZATION WITH CORONARY ANGIOGRAM;  Surgeon: [name], MD;  Location: [facility] CATH LAB;  Service: Cardiovascular;  Laterality: N/A;   PERCUTANEOUS CORONARY STENT INTERVENTION (PCI-S) N/A 08/24/2013   Procedure: PERCUTANEOUS CORONARY STENT INTERVENTION (PCI-S);  Surgeon: [name], MD;  Location: [facility] CATH LAB;  Service: Cardiovascular;  Laterality: N/A;   TONSILLECTOMY     TUBAL LIGATION      Current Outpatient Medications  Medication Sig Dispense Refill   amitriptyline (ELAVIL) 25 MG tablet Take 1 tablet (25 mg total) by mouth at bedtime. 90 tablet 3   baclofen (LIORESAL) 10 MG tablet Take 1 tablet (10 mg total) by mouth 3 (three) times daily. 90 each 2   buPROPion (WELLBUTRIN XL) 150 MG 24 hr tablet Take 1 tablet (150 mg total) by mouth daily. 90 tablet 3   cetirizine (ZYRTEC) 10 MG tablet Take 10 mg by mouth daily.     chlorthalidone (HYGROTON) 25 MG tablet Take 1 tablet (25 mg total) by mouth daily. 90 tablet 3   clopidogrel (PLAVIX) 75 MG  tablet Take 1 tablet (75 mg total) by mouth daily. 90 tablet 3   diclofenac Sodium (VOLTAREN) 1 % GEL Apply 2 g topically 4 (four) times daily. 350 g 3   gabapentin (NEURONTIN) 100 MG capsule Take 1 capsule (100 mg total) by mouth 4 (four) times daily. 360 capsule 3   hydrALAZINE (APRESOLINE) 50 MG tablet Take 50 mg by mouth 2 (two) times daily.     hydrocortisone (ANUSOL-HC) 2.5 % rectal cream Place 1 Application rectally 2 (two) times daily. As needed for rectal discomfort. 30 g 1   metoprolol succinate (TOPROL-XL) 50 MG 24 hr tablet TAKE ONE TABLET BY MOUTH EVERY DAY IMMEDIATELY FOLLOWING A MEAL 90 tablet 3   montelukast (SINGULAIR) 10 MG tablet Take 1 tablet (10 mg total) by mouth at  bedtime. 90 tablet 3   Multiple Vitamin (MULTIVITAMIN) tablet Take 1 tablet by mouth daily.     nitroGLYCERIN (NITROSTAT) 0.4 MG SL tablet DISSOLVE ONE TABLET UNDER TONGUE AS NEEDED FOR CHEST PAIN FOR 3 DOSES AND IF NO RELIEF PROCEED TO ER OR CALL 911 100 tablet 1   rosuvastatin (CRESTOR) 40 MG tablet Take 1 tablet (40 mg total) by mouth daily. 90 tablet 3   ezetimibe (ZETIA) 10 MG tablet Take 1 tablet (10 mg total) by mouth daily. 30 tablet 6   No current facility-administered medications for this visit.    Allergies as of 08/25/2023 - Review Complete 08/25/2023  Allergen Reaction Noted   Nsaids Other (See Comments) 07/25/2016   Atorvastatin Other (See Comments) 11/30/2018    Family History  Problem Relation Age of Onset   Cancer Mother        unsure of type    Other Father        grangrene    Asthma Sister    Stroke Sister    COPD Sister    Stroke Sister    Hypertension Brother    Transient ischemic attack Brother    Early death Brother    Early death Brother    Liver cancer Daughter    Cancer Daughter    Pancreatic disease Daughter        pancreatectomy   Cancer Daughter    Diverticulitis Daughter    Arthritis Daughter        knee    Arthritis Daughter        shoulder    Anemia Daughter    Hernia Son        Umbilical   GI problems Son    Liver cancer Son    GER disease Son    Colon cancer Neg Hx    Colon polyps Neg Hx     Social History   Socioeconomic History   Marital status: Divorced    Spouse name: Not on file   Number of children: 8   Years of education: 11   Highest education level: 11th grade  Occupational History   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   No daily caffeine use.   2 story home but she only has to use one level   Social Drivers of Corporate investment banker Strain: Low Risk  (08/08/2021)   Overall Financial Resource Strain (CARDIA)    Difficulty of Paying Living Expenses: Not hard at all  Food Insecurity: No Food Insecurity (08/08/2021)   Hunger Vital Sign    Worried About Running Out of Food in the Last Year: Never true    Ran Out of Food in the Last Year: Never true  Transportation Needs: No Transportation Needs (08/08/2021)   PRAPARE - Administrator, Civil Service (Medical): No    Lack of Transportation (Non-Medical): No  Physical Activity: Inactive (12/17/2021)   Exercise Vital Sign    Days of Exercise per Week: 0 days    Minutes of Exercise per Session: 0 min  Stress: Stress Concern Present (12/17/2021)   [employer] of Occupational Health - Occupational Stress Questionnaire    Feeling of Stress : To some extent  Social Connections: Socially Isolated (08/08/2021)   Social Connection and Isolation Panel [NHANES]    Frequency of Communication with Friends and Family: More than three times a week    Frequency of Social Gatherings with Friends and Family: More than three times a week    Attends Religious Services: Never    Database administrator or Organizations: No    Attends Banker Meetings: Never    Marital Status: Widowed  Intimate Partner Violence: Not At Risk (08/08/2021)   Humiliation, Afraid, Rape, and Kick questionnaire    Fear of Current or Ex-Partner: No    Emotionally Abused: No    Physically Abused: No    Sexually Abused: No     Review of Systems   Gen: Denies any fever, chills, fatigue, weight loss, lack of appetite.  CV: Denies  chest pain, heart palpitations, peripheral edema, syncope.  Resp: Denies shortness of breath at rest or with exertion. Denies wheezing or cough.  GI: Denies dysphagia or odynophagia. Denies jaundice, hematemesis, fecal incontinence. GU : Denies urinary burning, urinary frequency, urinary hesitancy MS: Denies joint pain, muscle weakness, cramps, or limitation of movement.  Derm: Denies rash, itching, dry skin Psych: Denies depression, anxiety, memory loss, and confusion Heme: Denies bruising, bleeding, and enlarged lymph nodes.   Physical Exam   BP (!) 143/78 (BP Location: Right Arm, Patient Position: Sitting, Cuff Size: Large)   Pulse (!) 109   Temp 97.8 F (36.6 C) (Oral)   Ht 5\' 2"  (1.575 m)   Wt 224 lb 9.6 oz (101.9 kg)   BMI 41.08 kg/m  General:   Alert and oriented. Pleasant and cooperative. Well-nourished and well-developed.  Head:  Normocephalic and atraumatic. Eyes:  Without icterus Abdomen:  +BS, soft, non-tender and non-distended. No HSM noted. No guarding or rebound. No masses appreciated.  Rectal:  Deferred  Msk:  Symmetrical without gross deformities. Normal posture. Extremities:  With chronic lymphedema Neurologic:  Alert and  oriented x4;  grossly normal neurologically. Skin:  Intact without significant lesions or rashes. Psych:  Alert and cooperative. Normal mood and affect.   Assessment   Shelly [name] is a very kind 83 y.o. female presenting today with a history of chronic GERD, diverticular bleed many years prior, IDA with capsule study showing enteritis due to aspirin use, and chronic normocytic anemia with chronic disease component.  She  prefers frequent visits.  GERD is extremely well managed with pantoprazole very sparingly. No alarm signs/symptoms.  Weight fluctuating over past year but staying within her baselines,  no alarm signs/symptoms.   PLAN    Return in 3 months as planned Call if any concerns   Gelene Mink, PhD, [facility]  Gastroenterology

## 2023-09-01 DIAGNOSIS — N179 Acute kidney failure, unspecified: Secondary | ICD-10-CM | POA: Diagnosis not present

## 2023-09-01 DIAGNOSIS — D631 Anemia in chronic kidney disease: Secondary | ICD-10-CM | POA: Diagnosis not present

## 2023-09-01 DIAGNOSIS — E1129 Type 2 diabetes mellitus with other diabetic kidney complication: Secondary | ICD-10-CM | POA: Diagnosis not present

## 2023-09-01 DIAGNOSIS — I5032 Chronic diastolic (congestive) heart failure: Secondary | ICD-10-CM | POA: Diagnosis not present

## 2023-09-10 ENCOUNTER — Telehealth: Payer: Self-pay

## 2023-09-10 NOTE — Telephone Encounter (Signed)
 Copied from CRM [id number]. Topic: Clinical - Medication Question >> Sep 10, 2023  3:56 PM Ivette P wrote: Reason for CRM: Pt called in to request a list of all the Current Medication she is currently taking under MD [name]. Pt can come by the office and pick up the paper listing all medication. Pt would like a call when ready for pick up [phone]

## 2023-09-10 NOTE — Telephone Encounter (Signed)
 Spoke with pt, med list printed and placed up front, pt will pick up tomorrow

## 2023-09-14 ENCOUNTER — Other Ambulatory Visit: Payer: Self-pay | Admitting: Family Medicine

## 2023-09-14 ENCOUNTER — Other Ambulatory Visit: Payer: Self-pay | Admitting: Cardiology

## 2023-09-14 DIAGNOSIS — B0229 Other postherpetic nervous system involvement: Secondary | ICD-10-CM

## 2023-09-22 ENCOUNTER — Emergency Department (HOSPITAL_COMMUNITY)

## 2023-09-22 ENCOUNTER — Encounter (HOSPITAL_COMMUNITY): Payer: Self-pay | Admitting: *Deleted

## 2023-09-22 ENCOUNTER — Observation Stay (HOSPITAL_COMMUNITY)
Admission: EM | Admit: 2023-09-22 | Discharge: 2023-09-24 | Disposition: A | Discharge status: Home / Self Care | Attending: Family Medicine | Admitting: Family Medicine

## 2023-09-22 ENCOUNTER — Other Ambulatory Visit: Payer: Self-pay

## 2023-09-22 DIAGNOSIS — M25461 Effusion, right knee: Secondary | ICD-10-CM | POA: Diagnosis not present

## 2023-09-22 DIAGNOSIS — E785 Hyperlipidemia, unspecified: Secondary | ICD-10-CM

## 2023-09-22 DIAGNOSIS — S069XAA Unspecified intracranial injury with loss of consciousness status unknown, initial encounter: Secondary | ICD-10-CM | POA: Diagnosis present

## 2023-09-22 DIAGNOSIS — J9811 Atelectasis: Secondary | ICD-10-CM | POA: Diagnosis not present

## 2023-09-22 DIAGNOSIS — I2510 Atherosclerotic heart disease of native coronary artery without angina pectoris: Secondary | ICD-10-CM | POA: Diagnosis present

## 2023-09-22 DIAGNOSIS — Z743 Need for continuous supervision: Secondary | ICD-10-CM | POA: Diagnosis not present

## 2023-09-22 DIAGNOSIS — S20212A Contusion of left front wall of thorax, initial encounter: Secondary | ICD-10-CM | POA: Diagnosis not present

## 2023-09-22 DIAGNOSIS — S60812A Abrasion of left wrist, initial encounter: Secondary | ICD-10-CM | POA: Insufficient documentation

## 2023-09-22 DIAGNOSIS — S060X1A Concussion with loss of consciousness of 30 minutes or less, initial encounter: Secondary | ICD-10-CM | POA: Diagnosis not present

## 2023-09-22 DIAGNOSIS — S8992XA Unspecified injury of left lower leg, initial encounter: Secondary | ICD-10-CM | POA: Diagnosis not present

## 2023-09-22 DIAGNOSIS — I517 Cardiomegaly: Secondary | ICD-10-CM | POA: Diagnosis not present

## 2023-09-22 DIAGNOSIS — I4729 Other ventricular tachycardia: Secondary | ICD-10-CM | POA: Diagnosis not present

## 2023-09-22 DIAGNOSIS — S3993XA Unspecified injury of pelvis, initial encounter: Secondary | ICD-10-CM | POA: Diagnosis not present

## 2023-09-22 DIAGNOSIS — S0990XA Unspecified injury of head, initial encounter: Secondary | ICD-10-CM | POA: Diagnosis not present

## 2023-09-22 DIAGNOSIS — S3991XA Unspecified injury of abdomen, initial encounter: Secondary | ICD-10-CM | POA: Diagnosis not present

## 2023-09-22 DIAGNOSIS — Z7902 Long term (current) use of antithrombotics/antiplatelets: Secondary | ICD-10-CM | POA: Insufficient documentation

## 2023-09-22 DIAGNOSIS — E039 Hypothyroidism, unspecified: Secondary | ICD-10-CM | POA: Insufficient documentation

## 2023-09-22 DIAGNOSIS — Z955 Presence of coronary angioplasty implant and graft: Secondary | ICD-10-CM | POA: Diagnosis not present

## 2023-09-22 DIAGNOSIS — S060X0A Concussion without loss of consciousness, initial encounter: Secondary | ICD-10-CM | POA: Diagnosis not present

## 2023-09-22 DIAGNOSIS — Z79899 Other long term (current) drug therapy: Secondary | ICD-10-CM | POA: Diagnosis not present

## 2023-09-22 DIAGNOSIS — N179 Acute kidney failure, unspecified: Secondary | ICD-10-CM | POA: Diagnosis not present

## 2023-09-22 DIAGNOSIS — Z947 Corneal transplant status: Secondary | ICD-10-CM | POA: Diagnosis not present

## 2023-09-22 DIAGNOSIS — G9341 Metabolic encephalopathy: Secondary | ICD-10-CM | POA: Diagnosis not present

## 2023-09-22 DIAGNOSIS — R5383 Other fatigue: Secondary | ICD-10-CM | POA: Diagnosis present

## 2023-09-22 DIAGNOSIS — I10 Essential (primary) hypertension: Secondary | ICD-10-CM | POA: Diagnosis not present

## 2023-09-22 DIAGNOSIS — R41841 Cognitive communication deficit: Secondary | ICD-10-CM | POA: Diagnosis not present

## 2023-09-22 DIAGNOSIS — E1122 Type 2 diabetes mellitus with diabetic chronic kidney disease: Secondary | ICD-10-CM | POA: Diagnosis present

## 2023-09-22 DIAGNOSIS — I472 Ventricular tachycardia, unspecified: Secondary | ICD-10-CM | POA: Diagnosis not present

## 2023-09-22 DIAGNOSIS — S069X1A Unspecified intracranial injury with loss of consciousness of 30 minutes or less, initial encounter: Secondary | ICD-10-CM

## 2023-09-22 DIAGNOSIS — M25462 Effusion, left knee: Secondary | ICD-10-CM | POA: Diagnosis not present

## 2023-09-22 DIAGNOSIS — M4802 Spinal stenosis, cervical region: Secondary | ICD-10-CM | POA: Diagnosis not present

## 2023-09-22 DIAGNOSIS — M47817 Spondylosis without myelopathy or radiculopathy, lumbosacral region: Secondary | ICD-10-CM | POA: Diagnosis not present

## 2023-09-22 DIAGNOSIS — E041 Nontoxic single thyroid nodule: Secondary | ICD-10-CM | POA: Diagnosis present

## 2023-09-22 DIAGNOSIS — Z9181 History of falling: Secondary | ICD-10-CM | POA: Diagnosis not present

## 2023-09-22 DIAGNOSIS — S199XXA Unspecified injury of neck, initial encounter: Secondary | ICD-10-CM | POA: Diagnosis not present

## 2023-09-22 DIAGNOSIS — M1711 Unilateral primary osteoarthritis, right knee: Secondary | ICD-10-CM | POA: Diagnosis not present

## 2023-09-22 DIAGNOSIS — M1712 Unilateral primary osteoarthritis, left knee: Secondary | ICD-10-CM | POA: Diagnosis not present

## 2023-09-22 DIAGNOSIS — S299XXA Unspecified injury of thorax, initial encounter: Secondary | ICD-10-CM | POA: Diagnosis not present

## 2023-09-22 DIAGNOSIS — M160 Bilateral primary osteoarthritis of hip: Secondary | ICD-10-CM | POA: Diagnosis not present

## 2023-09-22 DIAGNOSIS — S4992XA Unspecified injury of left shoulder and upper arm, initial encounter: Secondary | ICD-10-CM | POA: Diagnosis not present

## 2023-09-22 DIAGNOSIS — M19032 Primary osteoarthritis, left wrist: Secondary | ICD-10-CM | POA: Diagnosis not present

## 2023-09-22 DIAGNOSIS — N184 Chronic kidney disease, stage 4 (severe): Secondary | ICD-10-CM | POA: Diagnosis present

## 2023-09-22 DIAGNOSIS — F419 Anxiety disorder, unspecified: Secondary | ICD-10-CM | POA: Diagnosis present

## 2023-09-22 DIAGNOSIS — R0989 Other specified symptoms and signs involving the circulatory and respiratory systems: Secondary | ICD-10-CM | POA: Diagnosis not present

## 2023-09-22 DIAGNOSIS — M25532 Pain in left wrist: Secondary | ICD-10-CM | POA: Diagnosis not present

## 2023-09-22 DIAGNOSIS — R6889 Other general symptoms and signs: Secondary | ICD-10-CM | POA: Diagnosis not present

## 2023-09-22 DIAGNOSIS — R000 Tachycardia, unspecified: Secondary | ICD-10-CM | POA: Diagnosis not present

## 2023-09-22 DIAGNOSIS — I129 Hypertensive chronic kidney disease with stage 1 through stage 4 chronic kidney disease, or unspecified chronic kidney disease: Secondary | ICD-10-CM | POA: Diagnosis not present

## 2023-09-22 DIAGNOSIS — S8991XA Unspecified injury of right lower leg, initial encounter: Secondary | ICD-10-CM | POA: Diagnosis not present

## 2023-09-22 DIAGNOSIS — G238 Other specified degenerative diseases of basal ganglia: Secondary | ICD-10-CM | POA: Diagnosis not present

## 2023-09-22 DIAGNOSIS — T07XXXA Unspecified multiple injuries, initial encounter: Secondary | ICD-10-CM | POA: Diagnosis not present

## 2023-09-22 DIAGNOSIS — R404 Transient alteration of awareness: Secondary | ICD-10-CM | POA: Diagnosis not present

## 2023-09-22 DIAGNOSIS — M7989 Other specified soft tissue disorders: Secondary | ICD-10-CM | POA: Diagnosis not present

## 2023-09-22 DIAGNOSIS — M5030 Other cervical disc degeneration, unspecified cervical region: Secondary | ICD-10-CM | POA: Diagnosis not present

## 2023-09-22 LAB — COMPREHENSIVE METABOLIC PANEL WITH GFR
ALT: 13 U/L (ref 0–44)
AST: 21 U/L (ref 15–41)
Albumin: 3.2 g/dL — ABNORMAL LOW (ref 3.5–5.0)
Alkaline Phosphatase: 67 U/L (ref 38–126)
Anion gap: 8 (ref 5–15)
BUN: 34 mg/dL — ABNORMAL HIGH (ref 8–23)
CO2: 21 mmol/L — ABNORMAL LOW (ref 22–32)
Calcium: 9.8 mg/dL (ref 8.9–10.3)
Chloride: 112 mmol/L — ABNORMAL HIGH (ref 98–111)
Creatinine, Ser: 1.99 mg/dL — ABNORMAL HIGH (ref 0.44–1.00)
GFR, Estimated: 25 mL/min — ABNORMAL LOW (ref >60)
Glucose, Bld: 122 mg/dL — ABNORMAL HIGH (ref 70–99)
Potassium: 4.1 mmol/L (ref 3.5–5.1)
Sodium: 141 mmol/L (ref 135–145)
Total Bilirubin: 0.5 mg/dL (ref 0.0–1.2)
Total Protein: 7.5 g/dL (ref 6.5–8.1)

## 2023-09-22 LAB — CBC
HCT: 33.4 % — ABNORMAL LOW (ref 36.0–46.0)
Hemoglobin: 10.4 g/dL — ABNORMAL LOW (ref 12.0–15.0)
MCH: 30.7 pg (ref 26.0–34.0)
MCHC: 31.1 g/dL (ref 30.0–36.0)
MCV: 98.5 fL (ref 80.0–100.0)
Platelets: 258 10*3/uL (ref 150–400)
RBC: 3.39 MIL/uL — ABNORMAL LOW (ref 3.87–5.11)
RDW: 13.4 % (ref 11.5–15.5)
WBC: 8.6 10*3/uL (ref 4.0–10.5)
nRBC: 0 % (ref 0.0–0.2)

## 2023-09-22 LAB — I-STAT CHEM 8, ED
BUN: 33 mg/dL — ABNORMAL HIGH (ref 8–23)
Calcium, Ion: 1.3 mmol/L (ref 1.15–1.40)
Chloride: 111 mmol/L (ref 98–111)
Creatinine, Ser: 2.1 mg/dL — ABNORMAL HIGH (ref 0.44–1.00)
Glucose, Bld: 122 mg/dL — ABNORMAL HIGH (ref 70–99)
HCT: 32 % — ABNORMAL LOW (ref 36.0–46.0)
Hemoglobin: 10.9 g/dL — ABNORMAL LOW (ref 12.0–15.0)
Potassium: 4.1 mmol/L (ref 3.5–5.1)
Sodium: 145 mmol/L (ref 135–145)
TCO2: 20 mmol/L — ABNORMAL LOW (ref 22–32)

## 2023-09-22 LAB — PROTIME-INR
INR: 1.1 (ref 0.8–1.2)
Prothrombin Time: 14.6 s (ref 11.4–15.2)

## 2023-09-22 LAB — TROPONIN I (HIGH SENSITIVITY)
Troponin I (High Sensitivity): 5 ng/L (ref <18)
Troponin I (High Sensitivity): 10 ng/L (ref <18)

## 2023-09-22 LAB — I-STAT CG4 LACTIC ACID, ED: Lactic Acid, Venous: 1.7 mmol/L (ref 0.5–1.9)

## 2023-09-22 LAB — SAMPLE TO BLOOD BANK

## 2023-09-22 LAB — ETHANOL: Alcohol, Ethyl (B): <15 mg/dL (ref <15)

## 2023-09-22 LAB — MAGNESIUM: Magnesium: 2.1 mg/dL (ref 1.7–2.4)

## 2023-09-22 MED ORDER — ACETAMINOPHEN 325 MG PO TABS
650.0000 mg | ORAL_TABLET | Freq: Once | ORAL | Status: DC
  Filled 2023-09-22: qty 2

## 2023-09-22 MED ORDER — MORPHINE SULFATE (PF) 4 MG/ML IV SOLN
4.0000 mg | Freq: Once | INTRAVENOUS | Status: AC
  Administered 2023-09-22: 4 mg via INTRAVENOUS
  Filled 2023-09-22: qty 1

## 2023-09-22 MED ORDER — FENTANYL CITRATE PF 50 MCG/ML IJ SOSY
PREFILLED_SYRINGE | INTRAMUSCULAR | Status: AC
  Filled 2023-09-22: qty 1

## 2023-09-22 MED ORDER — TETANUS-DIPHTH-ACELL PERTUSSIS 5-2.5-18.5 LF-MCG/0.5 IM SUSY
0.5000 mL | PREFILLED_SYRINGE | Freq: Once | INTRAMUSCULAR | Status: DC
  Filled 2023-09-22: qty 0.5

## 2023-09-22 MED ORDER — FENTANYL CITRATE PF 50 MCG/ML IJ SOSY
50.0000 ug | PREFILLED_SYRINGE | Freq: Once | INTRAMUSCULAR | Status: AC
  Administered 2023-09-22: 50 ug via INTRAVENOUS
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
## 2023-09-22 NOTE — Progress Notes (Signed)
 Orthopedic Tech Progress Note Patient Details:  Shelly [name] [birth year] [id number]  LEVEL 2 TRAUMA   Patient ID: Shelly [name], female   DOB: [birth year], 83 y.o.   MRN: [id number]  [name] 09/22/2023, 5:56 PM

## 2023-09-22 NOTE — ED Notes (Signed)
 Cardiology at bedside.

## 2023-09-22 NOTE — ED Notes (Signed)
 C-collar removed per Dr.kingsley

## 2023-09-22 NOTE — Progress Notes (Signed)
 Transition of Care [facility]) - CAGE-AID Screening   Patient Details  Name: Shelly [name] MRN: [id number] Date of Birth: 1940/11/17  Transition of Care [facility]) CM/SW Contact:    Marvis Sluder, RN Phone Number: 09/22/2023, 11:09 PM   Clinical Narrative:  Unable to screen, confused  CAGE-AID Screening: Substance Abuse Screening unable to be completed due to: : Patient unable to participate
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
## 2023-09-23 ENCOUNTER — Encounter (HOSPITAL_COMMUNITY): Payer: Self-pay | Admitting: Family Medicine

## 2023-09-23 ENCOUNTER — Observation Stay (HOSPITAL_COMMUNITY)

## 2023-09-23 DIAGNOSIS — R609 Edema, unspecified: Secondary | ICD-10-CM | POA: Diagnosis not present

## 2023-09-23 DIAGNOSIS — E041 Nontoxic single thyroid nodule: Secondary | ICD-10-CM | POA: Diagnosis present

## 2023-09-23 DIAGNOSIS — M79672 Pain in left foot: Secondary | ICD-10-CM | POA: Diagnosis not present

## 2023-09-23 DIAGNOSIS — M7732 Calcaneal spur, left foot: Secondary | ICD-10-CM | POA: Diagnosis not present

## 2023-09-23 DIAGNOSIS — S060X0A Concussion without loss of consciousness, initial encounter: Secondary | ICD-10-CM | POA: Diagnosis not present

## 2023-09-23 DIAGNOSIS — I4729 Other ventricular tachycardia: Secondary | ICD-10-CM | POA: Diagnosis not present

## 2023-09-23 DIAGNOSIS — S069X1A Unspecified intracranial injury with loss of consciousness of 30 minutes or less, initial encounter: Secondary | ICD-10-CM | POA: Diagnosis not present

## 2023-09-23 LAB — CBC
HCT: 33 % — ABNORMAL LOW (ref 36.0–46.0)
Hemoglobin: 10.3 g/dL — ABNORMAL LOW (ref 12.0–15.0)
MCH: 30.2 pg (ref 26.0–34.0)
MCHC: 31.2 g/dL (ref 30.0–36.0)
MCV: 96.8 fL (ref 80.0–100.0)
Platelets: 252 10*3/uL (ref 150–400)
RBC: 3.41 MIL/uL — ABNORMAL LOW (ref 3.87–5.11)
RDW: 13.3 % (ref 11.5–15.5)
WBC: 7.3 10*3/uL (ref 4.0–10.5)
nRBC: 0 % (ref 0.0–0.2)

## 2023-09-23 LAB — BASIC METABOLIC PANEL WITH GFR
Anion gap: 8 (ref 5–15)
BUN: 29 mg/dL — ABNORMAL HIGH (ref 8–23)
CO2: 22 mmol/L (ref 22–32)
Calcium: 9.6 mg/dL (ref 8.9–10.3)
Chloride: 112 mmol/L — ABNORMAL HIGH (ref 98–111)
Creatinine, Ser: 1.74 mg/dL — ABNORMAL HIGH (ref 0.44–1.00)
GFR, Estimated: 29 mL/min — ABNORMAL LOW (ref >60)
Glucose, Bld: 142 mg/dL — ABNORMAL HIGH (ref 70–99)
Potassium: 4.2 mmol/L (ref 3.5–5.1)
Sodium: 142 mmol/L (ref 135–145)

## 2023-09-23 MED ORDER — METOPROLOL TARTRATE 12.5 MG HALF TABLET
12.5000 mg | ORAL_TABLET | Freq: Four times a day (QID) | ORAL | Status: DC
  Administered 2023-09-23: 12.5 mg via ORAL
  Filled 2023-09-23: qty 1

## 2023-09-23 MED ORDER — ROSUVASTATIN CALCIUM 20 MG PO TABS: 40.0000 mg | ORAL_TABLET | Freq: Every day | ORAL | Status: DC

## 2023-09-23 MED ORDER — ROSUVASTATIN CALCIUM 20 MG PO TABS
40.0000 mg | ORAL_TABLET | Freq: Every day | ORAL | Status: DC
  Administered 2023-09-23: 40 mg via ORAL
  Filled 2023-09-23 (×2): qty 2

## 2023-09-23 MED ORDER — OXYCODONE HCL 5 MG PO TABS
2.5000 mg | ORAL_TABLET | ORAL | Status: DC | PRN
  Filled 2023-09-23: qty 1

## 2023-09-23 MED ORDER — ACETAMINOPHEN 500 MG PO TABS
1000.0000 mg | ORAL_TABLET | Freq: Four times a day (QID) | ORAL | Status: DC
  Administered 2023-09-23 – 2023-09-24 (×4): 1000 mg via ORAL
  Filled 2023-09-23 (×5): qty 2

## 2023-09-23 MED ORDER — HYDRALAZINE HCL 25 MG PO TABS
25.0000 mg | ORAL_TABLET | Freq: Two times a day (BID) | ORAL | Status: DC
  Administered 2023-09-23: 25 mg via ORAL
  Filled 2023-09-23 (×2): qty 1

## 2023-09-23 MED ORDER — CLOPIDOGREL BISULFATE 75 MG PO TABS
75.0000 mg | ORAL_TABLET | Freq: Every day | ORAL | Status: DC
Start: 2023-09-23 — End: 2023-09-24
  Administered 2023-09-23: 75 mg via ORAL
  Filled 2023-09-23 (×2): qty 1

## 2023-09-23 MED ORDER — FENTANYL CITRATE PF 50 MCG/ML IJ SOSY
12.5000 ug | PREFILLED_SYRINGE | INTRAMUSCULAR | Status: DC | PRN
  Administered 2023-09-23 (×2): 50 ug via INTRAVENOUS
  Filled 2023-09-23 (×2): qty 1

## 2023-09-23 MED ORDER — GABAPENTIN 100 MG PO CAPS
100.0000 mg | ORAL_CAPSULE | Freq: Three times a day (TID) | ORAL | Status: DC
  Administered 2023-09-23 (×3): 100 mg via ORAL
  Filled 2023-09-23 (×4): qty 1

## 2023-09-23 MED ORDER — PROCHLORPERAZINE EDISYLATE 10 MG/2ML IJ SOLN
INTRAMUSCULAR | Status: AC
  Filled 2023-09-23: qty 2

## 2023-09-23 MED ORDER — PROCHLORPERAZINE EDISYLATE 10 MG/2ML IJ SOLN
5.0000 mg | Freq: Four times a day (QID) | INTRAMUSCULAR | Status: DC | PRN
  Administered 2023-09-23: 5 mg via INTRAVENOUS

## 2023-09-23 MED ORDER — METOPROLOL SUCCINATE ER 50 MG PO TB24
50.0000 mg | ORAL_TABLET | Freq: Every day | ORAL | Status: DC
  Administered 2023-09-23: 50 mg via ORAL
  Filled 2023-09-23 (×2): qty 1

## 2023-09-23 MED ORDER — CHLORTHALIDONE 25 MG PO TABS
12.5000 mg | ORAL_TABLET | Freq: Every day | ORAL | Status: DC
  Administered 2023-09-23: 12.5 mg via ORAL
  Filled 2023-09-23 (×2): qty 0.5

## 2023-09-23 MED ORDER — ACETAMINOPHEN 650 MG RE SUPP: 650.0000 mg | Freq: Four times a day (QID) | RECTAL | Status: DC | PRN

## 2023-09-23 MED ORDER — SODIUM CHLORIDE 0.9% FLUSH
3.0000 mL | Freq: Two times a day (BID) | INTRAVENOUS | Status: DC
  Administered 2023-09-23 (×3): 3 mL via INTRAVENOUS

## 2023-09-23 MED ORDER — SENNOSIDES-DOCUSATE SODIUM 8.6-50 MG PO TABS: 1.0000 | ORAL_TABLET | Freq: Every evening | ORAL | Status: DC | PRN

## 2023-09-23 MED ORDER — ACETAMINOPHEN 325 MG PO TABS: 650.0000 mg | ORAL_TABLET | Freq: Four times a day (QID) | ORAL | Status: DC | PRN

## 2023-09-23 MED ORDER — EZETIMIBE 10 MG PO TABS
10.0000 mg | ORAL_TABLET | Freq: Every day | ORAL | Status: DC
  Administered 2023-09-23: 10 mg via ORAL
  Filled 2023-09-23 (×2): qty 1

## 2023-09-23 NOTE — ED Notes (Signed)
 Pt c/o nausea with small amount of clear vomit. Suction used for emesis.orders obtained for nausea meds
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
## 2023-09-23 NOTE — Progress Notes (Signed)
 Pt off unit to Radiology for x-ray. Family at bedside.
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
## 2023-09-23 NOTE — Progress Notes (Signed)
 PT Cancellation Note  Patient Details Name: Shelly [name] MRN: [id number] DOB: 08/17/1940   Cancelled Treatment:    Reason Eval/Treat Not Completed: Other (comment) Awaiting left ankle x-ray results.  Verdia Glad, PT, DPT Acute Rehabilitation Services Office [phone]    Claria Crofts 09/23/2023, 4:05 PM

## 2023-09-23 NOTE — Plan of Care (Signed)
  Problem: Education: Goal: Knowledge of General Education information will improve Description: Including pain rating scale, medication(s)/side effects and non-pharmacologic comfort measures Outcome: Progressing   Problem: Activity: Goal: Risk for activity intolerance will decrease Outcome: Progressing   Problem: Nutrition: Goal: Adequate nutrition will be maintained Outcome: Progressing   Problem: Coping: Goal: Level of anxiety will decrease Outcome: Progressing   Problem: Elimination: Goal: Will not experience complications related to urinary retention Outcome: Progressing   Problem: Pain Managment: Goal: General experience of comfort will improve and/or be controlled Outcome: Progressing   Problem: Safety: Goal: Ability to remain free from injury will improve Outcome: Progressing

## 2023-09-23 NOTE — Care Management Obs Status (Signed)
 MEDICARE OBSERVATION STATUS NOTIFICATION   Patient Details  Name: Shelly [name] MRN: [id number] Date of Birth: 04/22/1941   Medicare Observation Status Notification Given:  Yes    Felix Host 09/23/2023, 3:32 PM
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
## 2023-09-23 NOTE — Progress Notes (Signed)
 Pt return to unit from Radiology.

## 2023-09-24 ENCOUNTER — Ambulatory Visit: Admitting: Cardiology

## 2023-09-24 DIAGNOSIS — S069X1A Unspecified intracranial injury with loss of consciousness of 30 minutes or less, initial encounter: Secondary | ICD-10-CM | POA: Diagnosis not present

## 2023-09-24 LAB — CBC WITH DIFFERENTIAL/PLATELET
Abs Immature Granulocytes: 0.02 10*3/uL (ref 0.00–0.07)
Basophils Absolute: 0 10*3/uL (ref 0.0–0.1)
Basophils Relative: 0 %
Eosinophils Absolute: 0.1 10*3/uL (ref 0.0–0.5)
Eosinophils Relative: 1 %
HCT: 28.6 % — ABNORMAL LOW (ref 36.0–46.0)
Hemoglobin: 9.1 g/dL — ABNORMAL LOW (ref 12.0–15.0)
Immature Granulocytes: 0 %
Lymphocytes Relative: 38 %
Lymphs Abs: 2.4 10*3/uL (ref 0.7–4.0)
MCH: 30.7 pg (ref 26.0–34.0)
MCHC: 31.8 g/dL (ref 30.0–36.0)
MCV: 96.6 fL (ref 80.0–100.0)
Monocytes Absolute: 0.8 10*3/uL (ref 0.1–1.0)
Monocytes Relative: 12 %
Neutro Abs: 3.1 10*3/uL (ref 1.7–7.7)
Neutrophils Relative %: 49 %
Platelets: 222 10*3/uL (ref 150–400)
RBC: 2.96 MIL/uL — ABNORMAL LOW (ref 3.87–5.11)
RDW: 13.4 % (ref 11.5–15.5)
WBC: 6.5 10*3/uL (ref 4.0–10.5)
nRBC: 0 % (ref 0.0–0.2)

## 2023-09-24 LAB — BASIC METABOLIC PANEL WITH GFR
Anion gap: 9 (ref 5–15)
BUN: 39 mg/dL — ABNORMAL HIGH (ref 8–23)
CO2: 21 mmol/L — ABNORMAL LOW (ref 22–32)
Calcium: 8.9 mg/dL (ref 8.9–10.3)
Chloride: 107 mmol/L (ref 98–111)
Creatinine, Ser: 2.29 mg/dL — ABNORMAL HIGH (ref 0.44–1.00)
GFR, Estimated: 21 mL/min — ABNORMAL LOW (ref >60)
Glucose, Bld: 103 mg/dL — ABNORMAL HIGH (ref 70–99)
Potassium: 4.3 mmol/L (ref 3.5–5.1)
Sodium: 137 mmol/L (ref 135–145)

## 2023-09-24 MED ORDER — METOPROLOL SUCCINATE ER 50 MG PO TB24: 50.0000 mg | ORAL_TABLET | Freq: Every day | ORAL | 3 refills | Status: AC

## 2023-09-24 MED ORDER — CHLORTHALIDONE 25 MG PO TABS: 12.5000 mg | ORAL_TABLET | Freq: Every day | ORAL | Status: DC

## 2023-09-24 MED ORDER — ACETAMINOPHEN 500 MG PO TABS: 1000.0000 mg | ORAL_TABLET | Freq: Four times a day (QID) | ORAL | 0 refills | Status: AC

## 2023-09-24 MED ORDER — HYDRALAZINE HCL 25 MG PO TABS: 25.0000 mg | ORAL_TABLET | Freq: Two times a day (BID) | ORAL | 3 refills | Status: AC
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
## 2023-09-24 NOTE — Progress Notes (Signed)
 Patient refuses her morning medications this morning. RN educated patient on the importance of taking her medications as scheduled. Patient express understanding and still refuses. Per patient, she wants to take them at home. Dr. Haywood Lisle made aware of the refusal of medications. Will relay this to patient's family for them to give her missed medications at home.

## 2023-09-24 NOTE — Progress Notes (Signed)
 Found patient on the side of the bed, upset and demanding to see the doctor. Patient expressed she does not understand why she is alone in the room and why we are in the room when she wanted the doctor. Staff explained the situation to patient. Advised her that other staff member have already reached out to her doctor and will be here soon to see her. RN called patient's daughters and spoke with [name] to update her. Also, gave phone to patient to speak with daughter. Patient would not return to laying down in bed; refuses breakfast, refuses the monitors/cords on her. Bed alarm reminds on.
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
## 2023-09-24 NOTE — Progress Notes (Addendum)
 AVS reviewed with patient's daughter [name]; a copy also given to her. Patient left unit with NT Renee via wheelchair to transportation at 1219pm.

## 2023-09-24 NOTE — Progress Notes (Signed)
 Rescheduled patient's May 8th appointment with Dr. Amanda Jungling to tomorrow at the requests of patient and her daughters. Patient's new appointment with Dr. Amanda Jungling is 09/25/2023 at 0820am. Written this down on patient's AVS.
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
## 2023-09-24 NOTE — Progress Notes (Signed)
 Reviewed AVS with patient; all questions answered. A copy of it given patient. Will review with patient's family when they arrived to unit to get patient.
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
## 2023-09-25 ENCOUNTER — Telehealth: Payer: Self-pay | Admitting: Family Medicine

## 2023-09-25 ENCOUNTER — Ambulatory Visit: Admitting: Cardiology

## 2023-09-25 ENCOUNTER — Encounter: Payer: Self-pay | Admitting: Cardiology
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
## 2023-09-25 NOTE — Telephone Encounter (Signed)
 Lmtcb

## 2023-09-25 NOTE — Telephone Encounter (Signed)
 Medication lisyt printed and taken up front left voice mail.

## 2023-09-25 NOTE — Telephone Encounter (Signed)
 Copied from CRM [id number]. Topic: Clinical - Medication Question >> Sep 25, 2023  3:39 PM Blair Bumpers wrote: Reason for CRM: Patient's daughter, Calirae Tienda, would like for a nurse or provider to give her a call in regards to getting a list of all of the medications that her mom takes. She states her mom takes so many medications and she doesn't know what each medication is for. Please give her a call back at [phone].
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
## 2023-09-25 NOTE — Telephone Encounter (Signed)
 The only option for patient to see PCP, would be to schedule her on PCP's DOD day (5/14) but PCP already has 2 HFU scheduled for that day.  Is PCP okay with this?

## 2023-09-28 ENCOUNTER — Telehealth: Payer: Self-pay | Admitting: Family Medicine

## 2023-09-28 ENCOUNTER — Encounter (HOSPITAL_COMMUNITY): Payer: Self-pay

## 2023-09-28 DIAGNOSIS — S8991XA Unspecified injury of right lower leg, initial encounter: Secondary | ICD-10-CM | POA: Diagnosis not present

## 2023-09-28 DIAGNOSIS — I6782 Cerebral ischemia: Secondary | ICD-10-CM | POA: Diagnosis not present

## 2023-09-28 DIAGNOSIS — G9341 Metabolic encephalopathy: Secondary | ICD-10-CM | POA: Diagnosis not present

## 2023-09-28 DIAGNOSIS — M25551 Pain in right hip: Secondary | ICD-10-CM | POA: Diagnosis not present

## 2023-09-28 DIAGNOSIS — S3993XA Unspecified injury of pelvis, initial encounter: Secondary | ICD-10-CM | POA: Diagnosis not present

## 2023-09-28 DIAGNOSIS — M47812 Spondylosis without myelopathy or radiculopathy, cervical region: Secondary | ICD-10-CM | POA: Diagnosis not present

## 2023-09-28 DIAGNOSIS — M25461 Effusion, right knee: Secondary | ICD-10-CM | POA: Diagnosis not present

## 2023-09-28 DIAGNOSIS — I371 Nonrheumatic pulmonary valve insufficiency: Secondary | ICD-10-CM | POA: Diagnosis not present

## 2023-09-28 DIAGNOSIS — N19 Unspecified kidney failure: Secondary | ICD-10-CM | POA: Diagnosis not present

## 2023-09-28 DIAGNOSIS — M25561 Pain in right knee: Secondary | ICD-10-CM | POA: Diagnosis not present

## 2023-09-28 DIAGNOSIS — I5030 Unspecified diastolic (congestive) heart failure: Secondary | ICD-10-CM | POA: Diagnosis not present

## 2023-09-28 DIAGNOSIS — M25552 Pain in left hip: Secondary | ICD-10-CM | POA: Diagnosis not present

## 2023-09-28 DIAGNOSIS — S79912A Unspecified injury of left hip, initial encounter: Secondary | ICD-10-CM | POA: Diagnosis not present

## 2023-09-28 DIAGNOSIS — R404 Transient alteration of awareness: Secondary | ICD-10-CM | POA: Diagnosis not present

## 2023-09-28 DIAGNOSIS — M25562 Pain in left knee: Secondary | ICD-10-CM | POA: Diagnosis not present

## 2023-09-28 DIAGNOSIS — M5030 Other cervical disc degeneration, unspecified cervical region: Secondary | ICD-10-CM | POA: Diagnosis not present

## 2023-09-28 DIAGNOSIS — M25462 Effusion, left knee: Secondary | ICD-10-CM | POA: Diagnosis not present

## 2023-09-28 DIAGNOSIS — M50221 Other cervical disc displacement at C4-C5 level: Secondary | ICD-10-CM | POA: Diagnosis not present

## 2023-09-28 DIAGNOSIS — S79911A Unspecified injury of right hip, initial encounter: Secondary | ICD-10-CM | POA: Diagnosis not present

## 2023-09-28 DIAGNOSIS — M160 Bilateral primary osteoarthritis of hip: Secondary | ICD-10-CM | POA: Diagnosis not present

## 2023-09-28 DIAGNOSIS — S8992XA Unspecified injury of left lower leg, initial encounter: Secondary | ICD-10-CM | POA: Diagnosis not present

## 2023-09-28 DIAGNOSIS — M170 Bilateral primary osteoarthritis of knee: Secondary | ICD-10-CM | POA: Diagnosis not present

## 2023-09-28 DIAGNOSIS — I509 Heart failure, unspecified: Secondary | ICD-10-CM | POA: Diagnosis not present
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
## 2023-09-29 ENCOUNTER — Inpatient Hospital Stay (HOSPITAL_COMMUNITY)
Admission: AD | Admit: 2023-09-29 | Discharge: 2023-10-05 | DRG: 071 | Disposition: A | Discharge status: Skilled Nursing Facility | Source: Other Acute Inpatient Hospital | Attending: Internal Medicine | Admitting: Internal Medicine

## 2023-09-29 ENCOUNTER — Inpatient Hospital Stay (HOSPITAL_COMMUNITY)
Admission: AD | Admit: 2023-09-29 | Discharge: 2023-09-29 | Disposition: A | Discharge status: Home / Self Care | Source: Other Acute Inpatient Hospital | Attending: Internal Medicine | Admitting: Internal Medicine

## 2023-09-29 ENCOUNTER — Encounter (HOSPITAL_COMMUNITY): Payer: Self-pay | Admitting: Internal Medicine

## 2023-09-29 ENCOUNTER — Other Ambulatory Visit: Payer: Self-pay

## 2023-09-29 ENCOUNTER — Inpatient Hospital Stay (HOSPITAL_COMMUNITY)

## 2023-09-29 ENCOUNTER — Inpatient Hospital Stay (HOSPITAL_COMMUNITY)
Admission: AD | Admit: 2023-09-29 | Source: Other Acute Inpatient Hospital | Attending: Internal Medicine | Admitting: Internal Medicine

## 2023-09-29 DIAGNOSIS — B0229 Other postherpetic nervous system involvement: Secondary | ICD-10-CM | POA: Diagnosis not present

## 2023-09-29 DIAGNOSIS — R4182 Altered mental status, unspecified: Secondary | ICD-10-CM | POA: Diagnosis not present

## 2023-09-29 DIAGNOSIS — G9341 Metabolic encephalopathy: Principal | ICD-10-CM | POA: Diagnosis present

## 2023-09-29 DIAGNOSIS — I6782 Cerebral ischemia: Secondary | ICD-10-CM | POA: Diagnosis not present

## 2023-09-29 DIAGNOSIS — M1990 Unspecified osteoarthritis, unspecified site: Secondary | ICD-10-CM | POA: Diagnosis not present

## 2023-09-29 DIAGNOSIS — Z8249 Family history of ischemic heart disease and other diseases of the circulatory system: Secondary | ICD-10-CM

## 2023-09-29 DIAGNOSIS — I252 Old myocardial infarction: Secondary | ICD-10-CM

## 2023-09-29 DIAGNOSIS — I129 Hypertensive chronic kidney disease with stage 1 through stage 4 chronic kidney disease, or unspecified chronic kidney disease: Secondary | ICD-10-CM | POA: Diagnosis not present

## 2023-09-29 DIAGNOSIS — Z800 Family history of malignant neoplasm of digestive organs: Secondary | ICD-10-CM | POA: Diagnosis not present

## 2023-09-29 DIAGNOSIS — M170 Bilateral primary osteoarthritis of knee: Secondary | ICD-10-CM | POA: Diagnosis not present

## 2023-09-29 DIAGNOSIS — I2510 Atherosclerotic heart disease of native coronary artery without angina pectoris: Secondary | ICD-10-CM | POA: Diagnosis not present

## 2023-09-29 DIAGNOSIS — Z823 Family history of stroke: Secondary | ICD-10-CM

## 2023-09-29 DIAGNOSIS — E1122 Type 2 diabetes mellitus with diabetic chronic kidney disease: Secondary | ICD-10-CM | POA: Diagnosis not present

## 2023-09-29 DIAGNOSIS — M6281 Muscle weakness (generalized): Secondary | ICD-10-CM | POA: Diagnosis not present

## 2023-09-29 DIAGNOSIS — Z809 Family history of malignant neoplasm, unspecified: Secondary | ICD-10-CM

## 2023-09-29 DIAGNOSIS — Z947 Corneal transplant status: Secondary | ICD-10-CM | POA: Diagnosis not present

## 2023-09-29 DIAGNOSIS — R569 Unspecified convulsions: Secondary | ICD-10-CM | POA: Diagnosis not present

## 2023-09-29 DIAGNOSIS — I371 Nonrheumatic pulmonary valve insufficiency: Secondary | ICD-10-CM | POA: Diagnosis not present

## 2023-09-29 DIAGNOSIS — M47812 Spondylosis without myelopathy or radiculopathy, cervical region: Secondary | ICD-10-CM | POA: Diagnosis not present

## 2023-09-29 DIAGNOSIS — E785 Hyperlipidemia, unspecified: Secondary | ICD-10-CM | POA: Diagnosis present

## 2023-09-29 DIAGNOSIS — Z825 Family history of asthma and other chronic lower respiratory diseases: Secondary | ICD-10-CM | POA: Diagnosis not present

## 2023-09-29 DIAGNOSIS — Z955 Presence of coronary angioplasty implant and graft: Secondary | ICD-10-CM | POA: Diagnosis not present

## 2023-09-29 DIAGNOSIS — S79912A Unspecified injury of left hip, initial encounter: Secondary | ICD-10-CM | POA: Diagnosis not present

## 2023-09-29 DIAGNOSIS — M25461 Effusion, right knee: Secondary | ICD-10-CM | POA: Diagnosis not present

## 2023-09-29 DIAGNOSIS — I5030 Unspecified diastolic (congestive) heart failure: Secondary | ICD-10-CM | POA: Diagnosis not present

## 2023-09-29 DIAGNOSIS — M25462 Effusion, left knee: Secondary | ICD-10-CM | POA: Diagnosis not present

## 2023-09-29 DIAGNOSIS — N19 Unspecified kidney failure: Secondary | ICD-10-CM | POA: Diagnosis not present

## 2023-09-29 DIAGNOSIS — N184 Chronic kidney disease, stage 4 (severe): Secondary | ICD-10-CM | POA: Diagnosis present

## 2023-09-29 DIAGNOSIS — R278 Other lack of coordination: Secondary | ICD-10-CM | POA: Diagnosis not present

## 2023-09-29 DIAGNOSIS — M160 Bilateral primary osteoarthritis of hip: Secondary | ICD-10-CM | POA: Diagnosis not present

## 2023-09-29 DIAGNOSIS — M5030 Other cervical disc degeneration, unspecified cervical region: Secondary | ICD-10-CM | POA: Diagnosis not present

## 2023-09-29 DIAGNOSIS — S069XAA Unspecified intracranial injury with loss of consciousness status unknown, initial encounter: Secondary | ICD-10-CM | POA: Diagnosis not present

## 2023-09-29 DIAGNOSIS — I10 Essential (primary) hypertension: Secondary | ICD-10-CM | POA: Diagnosis not present

## 2023-09-29 DIAGNOSIS — K219 Gastro-esophageal reflux disease without esophagitis: Secondary | ICD-10-CM | POA: Diagnosis present

## 2023-09-29 DIAGNOSIS — Z7902 Long term (current) use of antithrombotics/antiplatelets: Secondary | ICD-10-CM | POA: Diagnosis not present

## 2023-09-29 DIAGNOSIS — N179 Acute kidney failure, unspecified: Secondary | ICD-10-CM | POA: Diagnosis not present

## 2023-09-29 DIAGNOSIS — R55 Syncope and collapse: Secondary | ICD-10-CM | POA: Diagnosis not present

## 2023-09-29 DIAGNOSIS — Z66 Do not resuscitate: Secondary | ICD-10-CM | POA: Diagnosis not present

## 2023-09-29 DIAGNOSIS — I509 Heart failure, unspecified: Secondary | ICD-10-CM | POA: Diagnosis not present

## 2023-09-29 DIAGNOSIS — R9089 Other abnormal findings on diagnostic imaging of central nervous system: Secondary | ICD-10-CM | POA: Diagnosis not present

## 2023-09-29 DIAGNOSIS — S79911A Unspecified injury of right hip, initial encounter: Secondary | ICD-10-CM | POA: Diagnosis not present

## 2023-09-29 DIAGNOSIS — G928 Other toxic encephalopathy: Principal | ICD-10-CM | POA: Diagnosis present

## 2023-09-29 DIAGNOSIS — Z79899 Other long term (current) drug therapy: Secondary | ICD-10-CM | POA: Diagnosis not present

## 2023-09-29 DIAGNOSIS — R5383 Other fatigue: Secondary | ICD-10-CM | POA: Diagnosis present

## 2023-09-29 LAB — URINALYSIS, ROUTINE W REFLEX MICROSCOPIC
Bilirubin Urine: NEGATIVE
Glucose, UA: NEGATIVE mg/dL
Hgb urine dipstick: NEGATIVE
Ketones, ur: NEGATIVE mg/dL
Leukocytes,Ua: NEGATIVE
Nitrite: NEGATIVE
Protein, ur: 30 mg/dL — AB
Specific Gravity, Urine: 1.016 (ref 1.005–1.030)
pH: 5 (ref 5.0–8.0)

## 2023-09-29 LAB — BLOOD GAS, ARTERIAL
Acid-base deficit: 2.6 mmol/L — ABNORMAL HIGH (ref 0.0–2.0)
Bicarbonate: 20.2 mmol/L (ref 20.0–28.0)
Drawn by: 24610
O2 Saturation: 99.6 %
Patient temperature: 36.8
pCO2 arterial: 29 mmHg — ABNORMAL LOW (ref 32–48)
pH, Arterial: 7.45 (ref 7.35–7.45)
pO2, Arterial: 103 mmHg (ref 83–108)

## 2023-09-29 LAB — AMMONIA: Ammonia: 20 umol/L (ref 9–35)

## 2023-09-29 LAB — TSH: TSH: 0.546 u[IU]/mL (ref 0.350–4.500)

## 2023-09-29 LAB — VITAMIN B12: Vitamin B-12: 451 pg/mL (ref 180–914)

## 2023-09-29 MED ORDER — GABAPENTIN 100 MG PO CAPS
100.0000 mg | ORAL_CAPSULE | Freq: Four times a day (QID) | ORAL | Status: DC
  Administered 2023-09-30 – 2023-10-05 (×18): 100 mg via ORAL
  Filled 2023-09-29 (×19): qty 1

## 2023-09-29 MED ORDER — DEXTROSE-SODIUM CHLORIDE 5-0.45 % IV SOLN: INTRAVENOUS | Status: AC

## 2023-09-29 MED ORDER — ACETAMINOPHEN 325 MG PO TABS: 650.0000 mg | ORAL_TABLET | Freq: Four times a day (QID) | ORAL | Status: DC | PRN

## 2023-09-29 MED ORDER — ONDANSETRON HCL 4 MG/2ML IJ SOLN: 4.0000 mg | Freq: Four times a day (QID) | INTRAMUSCULAR | Status: DC | PRN

## 2023-09-29 MED ORDER — BUPROPION HCL ER (XL) 150 MG PO TB24
150.0000 mg | ORAL_TABLET | Freq: Every day | ORAL | Status: DC
  Administered 2023-10-01 – 2023-10-05 (×5): 150 mg via ORAL
  Filled 2023-09-29 (×6): qty 1

## 2023-09-29 MED ORDER — ACETAMINOPHEN 650 MG RE SUPP: 650.0000 mg | Freq: Four times a day (QID) | RECTAL | Status: DC | PRN

## 2023-09-29 MED ORDER — MONTELUKAST SODIUM 10 MG PO TABS
10.0000 mg | ORAL_TABLET | Freq: Every day | ORAL | Status: DC
  Administered 2023-09-30 – 2023-10-04 (×5): 10 mg via ORAL
  Filled 2023-09-29 (×6): qty 1

## 2023-09-29 MED ORDER — AMITRIPTYLINE HCL 50 MG PO TABS
25.0000 mg | ORAL_TABLET | Freq: Every day | ORAL | Status: DC
  Administered 2023-09-30 – 2023-10-04 (×5): 25 mg via ORAL
  Filled 2023-09-29 (×6): qty 1

## 2023-09-29 MED ORDER — METOPROLOL SUCCINATE ER 50 MG PO TB24
50.0000 mg | ORAL_TABLET | Freq: Every day | ORAL | Status: DC
  Filled 2023-09-29: qty 1

## 2023-09-29 MED ORDER — ONDANSETRON HCL 4 MG PO TABS: 4.0000 mg | ORAL_TABLET | Freq: Four times a day (QID) | ORAL | Status: DC | PRN

## 2023-09-29 MED ORDER — HEPARIN SODIUM (PORCINE) 5000 UNIT/ML IJ SOLN
5000.0000 [IU] | Freq: Three times a day (TID) | INTRAMUSCULAR | Status: DC
  Administered 2023-09-29 – 2023-10-05 (×16): 5000 [IU] via SUBCUTANEOUS
  Filled 2023-09-29 (×17): qty 1

## 2023-09-29 MED ORDER — HYDRALAZINE HCL 25 MG PO TABS
25.0000 mg | ORAL_TABLET | Freq: Two times a day (BID) | ORAL | Status: DC
  Administered 2023-09-30 – 2023-10-05 (×8): 25 mg via ORAL
  Filled 2023-09-29 (×11): qty 1

## 2023-09-29 NOTE — Progress Notes (Signed)
 PT Cancellation Note  Patient Details Name: Shelly [name] MRN: [id number] DOB: [birth year]   Cancelled Treatment:    Reason Eval/Treat Not Completed: Patient at procedure or test/unavailable. EEG present, PT will follow up tomorrow.   Shelly [name] 09/29/2023, 5:27 PM

## 2023-09-29 NOTE — Telephone Encounter (Signed)
 Spoke with daughter. Pt had appt 5/14 but will not be able to attend. Pt admitted back to hospital due to not doing well per daughter. Aware to call back for follow up.
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
## 2023-09-29 NOTE — Plan of Care (Signed)

## 2023-09-29 NOTE — Progress Notes (Signed)
EEG complete. Results pending.  ?

## 2023-09-30 ENCOUNTER — Ambulatory Visit

## 2023-09-30 DIAGNOSIS — R569 Unspecified convulsions: Secondary | ICD-10-CM

## 2023-09-30 DIAGNOSIS — R4182 Altered mental status, unspecified: Secondary | ICD-10-CM

## 2023-09-30 DIAGNOSIS — G9341 Metabolic encephalopathy: Secondary | ICD-10-CM | POA: Diagnosis not present

## 2023-09-30 LAB — COMPREHENSIVE METABOLIC PANEL WITH GFR
ALT: 15 U/L (ref 0–44)
AST: 16 U/L (ref 15–41)
Albumin: 2.8 g/dL — ABNORMAL LOW (ref 3.5–5.0)
Alkaline Phosphatase: 60 U/L (ref 38–126)
Anion gap: 9 (ref 5–15)
BUN: 35 mg/dL — ABNORMAL HIGH (ref 8–23)
CO2: 19 mmol/L — ABNORMAL LOW (ref 22–32)
Calcium: 8.9 mg/dL (ref 8.9–10.3)
Chloride: 111 mmol/L (ref 98–111)
Creatinine, Ser: 1.97 mg/dL — ABNORMAL HIGH (ref 0.44–1.00)
GFR, Estimated: 25 mL/min — ABNORMAL LOW (ref >60)
Glucose, Bld: 149 mg/dL — ABNORMAL HIGH (ref 70–99)
Potassium: 4.1 mmol/L (ref 3.5–5.1)
Sodium: 139 mmol/L (ref 135–145)
Total Bilirubin: 0.5 mg/dL (ref 0.0–1.2)
Total Protein: 7 g/dL (ref 6.5–8.1)

## 2023-09-30 LAB — URINALYSIS, W/ REFLEX TO CULTURE (INFECTION SUSPECTED)
Bilirubin Urine: NEGATIVE
Glucose, UA: NEGATIVE mg/dL
Hgb urine dipstick: NEGATIVE
Ketones, ur: NEGATIVE mg/dL
Leukocytes,Ua: NEGATIVE
Nitrite: NEGATIVE
Protein, ur: 30 mg/dL — AB
Specific Gravity, Urine: 1.016 (ref 1.005–1.030)
pH: 5 (ref 5.0–8.0)

## 2023-09-30 LAB — RAPID URINE DRUG SCREEN, HOSP PERFORMED
Amphetamines: NOT DETECTED
Barbiturates: NOT DETECTED
Benzodiazepines: NOT DETECTED
Cocaine: NOT DETECTED
Opiates: NOT DETECTED
Tetrahydrocannabinol: NOT DETECTED

## 2023-09-30 LAB — CBC
HCT: 29.6 % — ABNORMAL LOW (ref 36.0–46.0)
Hemoglobin: 9.6 g/dL — ABNORMAL LOW (ref 12.0–15.0)
MCH: 30.5 pg (ref 26.0–34.0)
MCHC: 32.4 g/dL (ref 30.0–36.0)
MCV: 94 fL (ref 80.0–100.0)
Platelets: 303 10*3/uL (ref 150–400)
RBC: 3.15 MIL/uL — ABNORMAL LOW (ref 3.87–5.11)
RDW: 13.3 % (ref 11.5–15.5)
WBC: 6.6 10*3/uL (ref 4.0–10.5)
nRBC: 0 % (ref 0.0–0.2)

## 2023-09-30 LAB — MAGNESIUM: Magnesium: 2.5 mg/dL — ABNORMAL HIGH (ref 1.7–2.4)

## 2023-09-30 LAB — GLUCOSE, CAPILLARY: Glucose-Capillary: 161 mg/dL — ABNORMAL HIGH (ref 70–99)

## 2023-09-30 LAB — PHOSPHORUS: Phosphorus: 4.1 mg/dL (ref 2.5–4.6)

## 2023-09-30 MED ORDER — METOPROLOL TARTRATE 5 MG/5ML IV SOLN: 2.5000 mg | Freq: Four times a day (QID) | INTRAVENOUS | Status: DC

## 2023-09-30 MED ORDER — EZETIMIBE 10 MG PO TABS
10.0000 mg | ORAL_TABLET | Freq: Every day | ORAL | Status: DC
  Administered 2023-09-30 – 2023-10-05 (×6): 10 mg via ORAL
  Filled 2023-09-30 (×6): qty 1

## 2023-09-30 MED ORDER — LABETALOL HCL 5 MG/ML IV SOLN
5.0000 mg | INTRAVENOUS | Status: DC | PRN
  Administered 2023-09-30: 5 mg via INTRAVENOUS
  Filled 2023-09-30: qty 4

## 2023-09-30 MED ORDER — METOPROLOL SUCCINATE ER 50 MG PO TB24
50.0000 mg | ORAL_TABLET | Freq: Every day | ORAL | Status: DC
  Administered 2023-09-30 – 2023-10-05 (×4): 50 mg via ORAL
  Filled 2023-09-30 (×5): qty 1

## 2023-09-30 MED ORDER — ROSUVASTATIN CALCIUM 20 MG PO TABS
40.0000 mg | ORAL_TABLET | Freq: Every day | ORAL | Status: DC
  Administered 2023-09-30 – 2023-10-05 (×6): 40 mg via ORAL
  Filled 2023-09-30 (×6): qty 2

## 2023-09-30 MED ORDER — CLOPIDOGREL BISULFATE 75 MG PO TABS
75.0000 mg | ORAL_TABLET | Freq: Every day | ORAL | Status: DC
  Administered 2023-09-30 – 2023-10-05 (×6): 75 mg via ORAL
  Filled 2023-09-30 (×6): qty 1

## 2023-09-30 NOTE — Progress Notes (Signed)
 SLP Cancellation Note  Patient Details Name: Shelly [name] MRN: [id number] DOB: 06-06-1940   Cancelled treatment:       Reason Eval/Treat Not Completed: Patient's level of consciousness. Pt is obtunded and does not follow commands. RN recommends holding evaluation until pt is more alert for participation. SLP will follow for readiness.    Amil Kale, M.A., CCC-SLP Speech Language Pathology, Acute Rehabilitation Services  Secure Chat preferred [phone]  09/30/2023, 9:50 AM

## 2023-09-30 NOTE — Progress Notes (Signed)
 OT Cancellation Note  Patient Details Name: Shelly [name] MRN: [id number] DOB: [birth year]   Cancelled Treatment:    Reason Eval/Treat Not Completed: (P) Patient's level of consciousness, per RN Pt is unresponsive and cannot follow commands. Will return when Pt is appropriate to participate in therapy  Scherry Curtis 09/30/2023, 11:49 AM

## 2023-09-30 NOTE — Progress Notes (Signed)
 PT Cancellation Note  Patient Details Name: Shelly [name] MRN: [id number] DOB: 04-28-1941   Cancelled Treatment:    Reason Eval/Treat Not Completed: Patient's level of consciousness (pt somnolent and not alerting to auditory, tactile, and minimally to noxious stimuli at nail bed. Will follow up at later date/time as pt able and schedule allows.)   Corbin Dess PT, DPT Acute Rehabilitation Services Office [phone]  09/30/23 12:34 PM

## 2023-09-30 NOTE — Procedures (Signed)
 Patient Name: Shelly [name]  MRN: [id number]  Epilepsy Attending: Arleene Lack  Referring Physician/Provider: Vada Garibaldi, MD  Date: 09/30/2023 Duration: 27.15 mins  Patient history: 83yo F with ams. EEG to evaluate for seizure  Level of alertness: Awake  AEDs during EEG study: GBP  Technical aspects: This EEG study was done with scalp electrodes positioned according to the 10-20 International system of electrode placement. Electrical activity was reviewed with band pass filter of 1-70Hz , sensitivity of 7 uV/mm, display speed of 34mm/sec with a 60Hz  notched filter applied as appropriate. EEG data were recorded continuously and digitally stored.  Video monitoring was available and reviewed as appropriate.  Description: The posterior dominant rhythm consists of 8 Hz activity of moderate voltage (25-35 uV) seen predominantly in posterior head regions, symmetric and reactive to eye opening and eye closing. EEG showed intermittent generalized 3 to 6 Hz theta-delta slowing. Physiologic photic driving was seen during photic stimulation.    ABNORMALITY - Intermittent slow, generalized  IMPRESSION: This study is suggestive of mild diffuse encephalopathy. No seizures or epileptiform discharges were seen throughout the recording.  Matisse Salais O Dakin Madani
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
## 2023-10-01 ENCOUNTER — Encounter (HOSPITAL_COMMUNITY): Payer: Self-pay | Admitting: Internal Medicine

## 2023-10-01 DIAGNOSIS — G9341 Metabolic encephalopathy: Secondary | ICD-10-CM | POA: Diagnosis not present

## 2023-10-01 LAB — COMPREHENSIVE METABOLIC PANEL WITH GFR
ALT: 14 U/L (ref 0–44)
AST: 19 U/L (ref 15–41)
Albumin: 2.9 g/dL — ABNORMAL LOW (ref 3.5–5.0)
Alkaline Phosphatase: 61 U/L (ref 38–126)
Anion gap: 10 (ref 5–15)
BUN: 29 mg/dL — ABNORMAL HIGH (ref 8–23)
CO2: 19 mmol/L — ABNORMAL LOW (ref 22–32)
Calcium: 9.4 mg/dL (ref 8.9–10.3)
Chloride: 110 mmol/L (ref 98–111)
Creatinine, Ser: 1.8 mg/dL — ABNORMAL HIGH (ref 0.44–1.00)
GFR, Estimated: 28 mL/min — ABNORMAL LOW (ref >60)
Glucose, Bld: 104 mg/dL — ABNORMAL HIGH (ref 70–99)
Potassium: 4.2 mmol/L (ref 3.5–5.1)
Sodium: 139 mmol/L (ref 135–145)
Total Bilirubin: 0.8 mg/dL (ref 0.0–1.2)
Total Protein: 7.2 g/dL (ref 6.5–8.1)

## 2023-10-01 LAB — CBC
HCT: 31.6 % — ABNORMAL LOW (ref 36.0–46.0)
Hemoglobin: 10 g/dL — ABNORMAL LOW (ref 12.0–15.0)
MCH: 30.2 pg (ref 26.0–34.0)
MCHC: 31.6 g/dL (ref 30.0–36.0)
MCV: 95.5 fL (ref 80.0–100.0)
Platelets: 303 10*3/uL (ref 150–400)
RBC: 3.31 MIL/uL — ABNORMAL LOW (ref 3.87–5.11)
RDW: 13.3 % (ref 11.5–15.5)
WBC: 8.5 10*3/uL (ref 4.0–10.5)
nRBC: 0 % (ref 0.0–0.2)
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
## 2023-10-01 NOTE — Progress Notes (Signed)
  Inpatient Rehabilitation Admissions Coordinator   Per therapy recommendations patient was screened for CIR candidacy by Jeannetta Millman RN MSN. Current payor trends with [employer] medicare are unlikley to approve a CIR/AIR level rehab for this diagnosis. I will not place a Rehab Conuslt at this time. Recommend other rehab venues to be pursued.   Jeannetta Millman, RN, MSN Rehab Admissions Coordinator [phone] 10/01/2023 10:23 AM
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
## 2023-10-01 NOTE — Progress Notes (Signed)
 Psychosocial Progressive/Outcome: ANOx4, intermittently confused  Family at bedside   Pain/Comfort Progression/Outcome: Pt did not complain of pain during shift   Clinical Progression/Outcome: Adequate fluid and diet intake Turned and repositioned during shift  Worked w/ PT Walked halfway to bathroom Sat up in chair  X2 assist  Voids to bedpan  No BM Slept in between care Maintained safety

## 2023-10-01 NOTE — TOC Progression Note (Signed)
 Transition of Care [facility]) - Progression Note    Patient Details  Name: Shelly [name] MRN: [id number] Date of Birth: 03/03/1941  Transition of Care [facility]) CM/SW Contact  Paullette Boston [place], [place] Phone Number: 10/01/2023, 3:17 PM  Clinical Narrative: Spoke with pt's dtr Chaya Cord PT/OT recommendation for SNF. Per dtr, pt with no prior SNF stay. Reviewed SNF placement process and answered questions. Dtr reports agreeable to SNF for STR and plans to discuss with pt and family this evening. No preferred facility indicated but requesting [facility]. SW will f/u with offers as available.    Paullette Boston, MSW, LCSW [phone] (coverage)             Expected Discharge Plan and Services                                               Social Determinants of Health (SDOH) Interventions SDOH Screenings   Food Insecurity: No Food Insecurity (09/29/2023)  Housing: Low Risk  (09/29/2023)  Transportation Needs: No Transportation Needs (09/29/2023)  Utilities: Not At Risk (09/29/2023)  Alcohol Screen: Low Risk  (08/08/2021)  Depression (PHQ2-9): Medium Risk (07/31/2023)  Financial Resource Strain: Low Risk  (08/08/2021)  Physical Activity: Inactive (12/17/2021)  Social Connections: Socially Isolated (09/29/2023)  Stress: Stress Concern Present (12/17/2021)  Tobacco Use: Low Risk  (10/01/2023)    Readmission Risk Interventions    09/30/2023   10:57 AM  Readmission Risk Prevention Plan  Transportation Screening Complete  PCP or Specialist Appt within 5-7 Days Complete  Home Care Screening Complete  Medication Review (RN CM) Referral to Pharmacy

## 2023-10-01 NOTE — NC FL2 (Signed)
 [place]  MEDICAID FL2 LEVEL OF CARE FORM     IDENTIFICATION  Patient Name: Shelly [name] Birthdate: 02-26-1941 Sex: female Admission Date (Current Location): 09/29/2023  [facility] and [place] Number:  Producer, television/film/video and Address:  [facility], [address] [place], [place] [address]      Provider Number: [id number]  Attending Physician Name and Address:  Oral Billings, MD  Relative Name and Phone Number:       Current Level of Care: Hospital Recommended Level of Care: Skilled Nursing Facility Prior Approval Number:    Date Approved/Denied:   PASRR Number: [id number]  Discharge Plan: SNF    Current Diagnoses: Patient Active Problem List   Diagnosis Date Noted   Acute metabolic encephalopathy 09/29/2023   Thyroid  nodule 09/23/2023   Mild TBI (HCC) 09/22/2023   NSVT (nonsustained ventricular tachycardia) (HCC) 09/22/2023   Loss of weight 10/16/2022   Type 2 diabetes mellitus with diabetic chronic kidney disease (HCC) 10/15/2022   Prediabetes 01/31/2022   Subclinical hyperthyroidism 06/06/2019   Normocytic anemia 06/02/2019   Postherpetic neuralgia 09/28/2018   Herpes zoster without complication 04/24/2018   Iron deficiency anemia 08/27/2016   Morbid obesity (HCC) 08/27/2016   CKD (chronic kidney disease), stage IV (HCC) 04/04/2016   Osteoarthritis left knee 04/04/2016   Third degree heart block (HCC) 12/10/2015   Pancolonic diverticulosis 12/09/2015   CAD (coronary artery disease) 01/11/2014   History of non-ST elevation myocardial infarction (NSTEMI) 08/23/2013   IDA (iron deficiency anemia) 08/23/2013   Essential hypertension 08/22/2013   GERD (gastroesophageal reflux disease) 08/22/2013   Anxiety 08/22/2013    Orientation RESPIRATION BLADDER Height & Weight     Self, Time, Situation, Place    Continent Weight:   Height:     BEHAVIORAL SYMPTOMS/MOOD NEUROLOGICAL BOWEL NUTRITION STATUS      Continent Diet (regular  solids and thin liquids)  AMBULATORY STATUS COMMUNICATION OF NEEDS Skin   Limited Assist Verbally Normal                       Personal Care Assistance Level of Assistance  Bathing, Feeding, Dressing Bathing Assistance: Limited assistance Feeding assistance: Limited assistance Dressing Assistance: Limited assistance     Functional Limitations Info  Sight, Hearing, Speech Sight Info: Adequate Hearing Info: Adequate Speech Info: Impaired    SPECIAL CARE FACTORS FREQUENCY  PT (By licensed PT), OT (By licensed OT), Speech therapy                    Contractures Contractures Info: Not present    Additional Factors Info  Code Status Code Status Info: DNR             Current Medications (10/01/2023):  This is the current hospital active medication list Current Facility-Administered Medications  Medication Dose Route Frequency Provider Last Rate Last Admin   acetaminophen  (TYLENOL ) tablet 650 mg  650 mg Oral Q6H PRN Ghimire, Kuber, MD       Or   acetaminophen  (TYLENOL ) suppository 650 mg  650 mg Rectal Q6H PRN Vada Garibaldi, MD       amitriptyline  (ELAVIL ) tablet 25 mg  25 mg Oral QHS Ghimire, Kuber, MD   25 mg at 09/30/23 2205   buPROPion  (WELLBUTRIN  XL) 24 hr tablet 150 mg  150 mg Oral Daily Ghimire, Kuber, MD   150 mg at 10/01/23 0931   clopidogrel  (PLAVIX ) tablet 75 mg  75 mg Oral Daily Cherylle Corwin  K, MD   75 mg at 10/01/23 0932   ezetimibe  (ZETIA ) tablet 10 mg  10 mg Oral Daily Oral Billings, MD   10 mg at 10/01/23 9604   gabapentin  (NEURONTIN ) capsule 100 mg  100 mg Oral QID Ghimire, Kuber, MD   100 mg at 10/01/23 0932   heparin  injection 5,000 Units  5,000 Units Subcutaneous Q8H Vada Garibaldi, MD   5,000 Units at 10/01/23 0515   hydrALAZINE  (APRESOLINE ) tablet 25 mg  25 mg Oral BID Ghimire, Kuber, MD   25 mg at 09/30/23 2205   labetalol (NORMODYNE) injection 5 mg  5 mg Intravenous Q2H PRN Oral Billings, MD   5 mg at 09/30/23 5409   metoprolol  succinate  (TOPROL -XL) 24 hr tablet 50 mg  50 mg Oral Daily Oral Billings, MD   50 mg at 09/30/23 1835   montelukast  (SINGULAIR ) tablet 10 mg  10 mg Oral QHS Ghimire, Kuber, MD   10 mg at 09/30/23 2206   ondansetron  (ZOFRAN ) tablet 4 mg  4 mg Oral Q6H PRN Ghimire, Kuber, MD       Or   ondansetron  (ZOFRAN ) injection 4 mg  4 mg Intravenous Q6H PRN Ghimire, Kuber, MD       rosuvastatin  (CRESTOR ) tablet 40 mg  40 mg Oral Daily Oral Billings, MD   40 mg at 10/01/23 8119     Discharge Medications: Please see discharge summary for a list of discharge medications.  Relevant Imaging Results:  Relevant Lab Results:   Additional Information SS# [id number]  Jeffory Mings, [place]

## 2023-10-02 ENCOUNTER — Encounter (HOSPITAL_COMMUNITY): Payer: Self-pay | Admitting: Internal Medicine

## 2023-10-02 ENCOUNTER — Inpatient Hospital Stay (HOSPITAL_COMMUNITY)

## 2023-10-02 DIAGNOSIS — G9341 Metabolic encephalopathy: Secondary | ICD-10-CM | POA: Diagnosis not present

## 2023-10-02 LAB — COMPREHENSIVE METABOLIC PANEL WITH GFR
ALT: 17 U/L (ref 0–44)
AST: 24 U/L (ref 15–41)
Albumin: 2.9 g/dL — ABNORMAL LOW (ref 3.5–5.0)
Alkaline Phosphatase: 63 U/L (ref 38–126)
Anion gap: 8 (ref 5–15)
BUN: 35 mg/dL — ABNORMAL HIGH (ref 8–23)
CO2: 20 mmol/L — ABNORMAL LOW (ref 22–32)
Calcium: 9 mg/dL (ref 8.9–10.3)
Chloride: 109 mmol/L (ref 98–111)
Creatinine, Ser: 2.04 mg/dL — ABNORMAL HIGH (ref 0.44–1.00)
GFR, Estimated: 24 mL/min — ABNORMAL LOW (ref >60)
Glucose, Bld: 101 mg/dL — ABNORMAL HIGH (ref 70–99)
Potassium: 4.5 mmol/L (ref 3.5–5.1)
Sodium: 137 mmol/L (ref 135–145)
Total Bilirubin: 0.8 mg/dL (ref 0.0–1.2)
Total Protein: 7 g/dL (ref 6.5–8.1)

## 2023-10-02 LAB — CBC
HCT: 30.9 % — ABNORMAL LOW (ref 36.0–46.0)
Hemoglobin: 9.7 g/dL — ABNORMAL LOW (ref 12.0–15.0)
MCH: 29.9 pg (ref 26.0–34.0)
MCHC: 31.4 g/dL (ref 30.0–36.0)
MCV: 95.4 fL (ref 80.0–100.0)
Platelets: 298 10*3/uL (ref 150–400)
RBC: 3.24 MIL/uL — ABNORMAL LOW (ref 3.87–5.11)
RDW: 13.2 % (ref 11.5–15.5)
WBC: 8.6 10*3/uL (ref 4.0–10.5)
nRBC: 0 % (ref 0.0–0.2)

## 2023-10-02 MED ORDER — HALOPERIDOL LACTATE 5 MG/ML IJ SOLN
5.0000 mg | Freq: Four times a day (QID) | INTRAMUSCULAR | Status: DC | PRN
  Administered 2023-10-02: 5 mg via INTRAVENOUS
  Filled 2023-10-02: qty 1

## 2023-10-02 MED ORDER — QUETIAPINE FUMARATE 25 MG PO TABS
25.0000 mg | ORAL_TABLET | Freq: Every day | ORAL | Status: DC
  Administered 2023-10-02 – 2023-10-04 (×3): 25 mg via ORAL
  Filled 2023-10-02 (×3): qty 1

## 2023-10-02 NOTE — Care Management Important Message (Signed)
 Important Message  Patient Details  Name: Shelly [name] MRN: [id number] Date of Birth: 1940/09/27   Important Message Given:  Yes - Medicare IM     Wynonia Hedges 10/02/2023, 2:15 PM

## 2023-10-02 NOTE — Progress Notes (Signed)
 Mobility Specialist Progress Note:    10/02/23 0900  Mobility  Activity Transferred to/from [facility];Ambulated with assistance in room  Level of Assistance Minimal assist, patient does 75% or more  Assistive Device Front wheel walker  Distance Ambulated (ft) 40 ft  Activity Response Tolerated well  Mobility Referral Yes  Mobility visit 1 Mobility  Mobility Specialist Start Time (ACUTE ONLY) [id number]  Mobility Specialist Stop Time (ACUTE ONLY) 0834  Mobility Specialist Time Calculation (min) (ACUTE ONLY) 15 min   Pt received on EOB, requesting assistance to [facility]. Able to step pivot to [facility] w/ minA+2. Void successful. Pt then able to stand independently and step pivot back to bed, taking a seated rest break. Agreeable to sit up in chair, pt able to stand from bed w/ minA and ambulate around room to chair. No complaints throughout. Required cues for direction and walker management. Pt left in chair with call bell and all needs met. Chair alarm on.  [place] Nolon Baxter Mobility Specialist Please contact via Special educational needs teacher or Rehab office at [phone]

## 2023-10-02 NOTE — Progress Notes (Signed)
 Psychosocial Progressive/Outcome: ANOx1, more confused throughout shift  Family at bedside    Pain/Comfort Progression/Outcome: Pt did not complain of pain during shift    Clinical Progression/Outcome: Adequate fluid and diet intake Worked w/ PT Sat up in chair for majority of the shift  X2 assist  Voids to [facility] No BM Slept in between care Maintained safety
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
## 2023-10-02 NOTE — Progress Notes (Signed)
 Echo attempted, patient is refusing to get out of chair for echo at this time. Will reattempt later in the day as schedule permits.  [facility] Jye Fariss RDCS

## 2023-10-02 NOTE — TOC Progression Note (Addendum)
 Transition of Care [facility]) - Progression Note    Patient Details  Name: Shelly [name] MRN: [id number] Date of Birth: 01/23/1941  [facility]) CM/SW Contact  Paullette Boston [place], [place] Phone Number: 10/02/2023, 10:02 AM  Clinical Narrative: Spoke with pt's dtr [name] who reports pt and family agreeable to SNF for STR. Provided dtr with current bed offers and she has accepted [facility]. Confirmed bed with Debbie at [facility]. Home and Community/[employer] auth request for SNF submitted, reference [id number]. SW will follow.   UPDATE 1140: Auth received for [facility] SNF: reference # [id number], valid 5/16-5/20. Per MD, pt not ready for dc today. Confirmed with Debbie at [place] they can admit over weekend.   Paullette Boston, MSW, LCSW [phone] (coverage)             Expected Discharge Plan and Services                                               Social Determinants of Health (SDOH) Interventions SDOH Screenings   Food Insecurity: No Food Insecurity (09/29/2023)  Housing: Low Risk  (09/29/2023)  Transportation Needs: No Transportation Needs (09/29/2023)  Utilities: Not At Risk (09/29/2023)  Alcohol Screen: Low Risk  (08/08/2021)  Depression (PHQ2-9): Medium Risk (07/31/2023)  Financial Resource Strain: Low Risk  (08/08/2021)  Physical Activity: Inactive (12/17/2021)  Social Connections: Socially Isolated (09/29/2023)  Stress: Stress Concern Present (12/17/2021)  Tobacco Use: Low Risk  (10/02/2023)    Readmission Risk Interventions    09/30/2023   10:57 AM  Readmission Risk Prevention Plan  Transportation Screening Complete  PCP or Specialist Appt within 5-7 Days Complete  Home Care Screening Complete  Medication Review (RN CM) Referral to Pharmacy

## 2023-10-03 ENCOUNTER — Inpatient Hospital Stay (HOSPITAL_COMMUNITY)

## 2023-10-03 DIAGNOSIS — R55 Syncope and collapse: Secondary | ICD-10-CM

## 2023-10-03 DIAGNOSIS — G9341 Metabolic encephalopathy: Secondary | ICD-10-CM | POA: Diagnosis not present

## 2023-10-03 LAB — CBC
HCT: 31.5 % — ABNORMAL LOW (ref 36.0–46.0)
Hemoglobin: 10.1 g/dL — ABNORMAL LOW (ref 12.0–15.0)
MCH: 30.7 pg (ref 26.0–34.0)
MCHC: 32.1 g/dL (ref 30.0–36.0)
MCV: 95.7 fL (ref 80.0–100.0)
Platelets: 314 10*3/uL (ref 150–400)
RBC: 3.29 MIL/uL — ABNORMAL LOW (ref 3.87–5.11)
RDW: 13.2 % (ref 11.5–15.5)
WBC: 8.3 10*3/uL (ref 4.0–10.5)
nRBC: 0 % (ref 0.0–0.2)

## 2023-10-03 LAB — COMPREHENSIVE METABOLIC PANEL WITH GFR
ALT: 17 U/L (ref 0–44)
AST: 23 U/L (ref 15–41)
Albumin: 2.7 g/dL — ABNORMAL LOW (ref 3.5–5.0)
Alkaline Phosphatase: 66 U/L (ref 38–126)
Anion gap: 9 (ref 5–15)
BUN: 45 mg/dL — ABNORMAL HIGH (ref 8–23)
CO2: 20 mmol/L — ABNORMAL LOW (ref 22–32)
Calcium: 8.8 mg/dL — ABNORMAL LOW (ref 8.9–10.3)
Chloride: 109 mmol/L (ref 98–111)
Creatinine, Ser: 2.13 mg/dL — ABNORMAL HIGH (ref 0.44–1.00)
GFR, Estimated: 23 mL/min — ABNORMAL LOW (ref >60)
Glucose, Bld: 118 mg/dL — ABNORMAL HIGH (ref 70–99)
Potassium: 4.3 mmol/L (ref 3.5–5.1)
Sodium: 138 mmol/L (ref 135–145)
Total Bilirubin: 0.6 mg/dL (ref 0.0–1.2)
Total Protein: 7 g/dL (ref 6.5–8.1)

## 2023-10-03 LAB — ECHOCARDIOGRAM COMPLETE
AR max vel: 3.72 cm2
AV Area VTI: 3.67 cm2
AV Area mean vel: 3.72 cm2
AV Mean grad: 5 mmHg
AV Peak grad: 8.4 mmHg
Ao pk vel: 1.45 m/s
Area-P 1/2: 8.07 cm2
S' Lateral: 2.3 cm

## 2023-10-03 MED ORDER — LACTATED RINGERS IV SOLN: INTRAVENOUS | Status: AC

## 2023-10-03 NOTE — Progress Notes (Signed)
 Mobility Specialist: Progress Note   10/03/23 1549  Mobility  Activity Transferred to/from [facility];Transferred from bed to chair  Level of Assistance Minimal assist, patient does 75% or more  Assistive Device Front wheel walker  Activity Response Tolerated well  Mobility Referral Yes  Mobility visit 1 Mobility  Mobility Specialist Start Time (ACUTE ONLY) 1500  Mobility Specialist Stop Time (ACUTE ONLY) 1514  Mobility Specialist Time Calculation (min) (ACUTE ONLY) 14 min    Pt was agreeable to mobility session, requesting to sit in the chair. Wanted to use BSC first. MinA for bed mobility to assist with trunk elevation. MinA for STS and stand pivot transfer to [facility]. Void successful and pericare done ind. Initially agreed to further ambulation but later declined after using BSC. Ambulated short distance to the chair. Left in chair with all needs met, call bell in reach.   Deloria Fetch Mobility Specialist Please contact via SecureChat or Rehab office at [phone]

## 2023-10-03 NOTE — Progress Notes (Signed)
 Echocardiogram 2D Echocardiogram has been performed.  Abdulhadi Stopa N Cande Mastropietro,RDCS 10/03/2023, 12:01 PM
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
## 2023-10-04 ENCOUNTER — Inpatient Hospital Stay (HOSPITAL_COMMUNITY)

## 2023-10-04 DIAGNOSIS — G9341 Metabolic encephalopathy: Secondary | ICD-10-CM | POA: Diagnosis not present

## 2023-10-04 LAB — CBC
HCT: 27.7 % — ABNORMAL LOW (ref 36.0–46.0)
Hemoglobin: 8.7 g/dL — ABNORMAL LOW (ref 12.0–15.0)
MCH: 30.4 pg (ref 26.0–34.0)
MCHC: 31.4 g/dL (ref 30.0–36.0)
MCV: 96.9 fL (ref 80.0–100.0)
Platelets: 299 10*3/uL (ref 150–400)
RBC: 2.86 MIL/uL — ABNORMAL LOW (ref 3.87–5.11)
RDW: 13.2 % (ref 11.5–15.5)
WBC: 7.5 10*3/uL (ref 4.0–10.5)
nRBC: 0 % (ref 0.0–0.2)

## 2023-10-04 LAB — COMPREHENSIVE METABOLIC PANEL WITH GFR
ALT: 20 U/L (ref 0–44)
AST: 43 U/L — ABNORMAL HIGH (ref 15–41)
Albumin: 2.5 g/dL — ABNORMAL LOW (ref 3.5–5.0)
Alkaline Phosphatase: 59 U/L (ref 38–126)
Anion gap: 7 (ref 5–15)
BUN: 43 mg/dL — ABNORMAL HIGH (ref 8–23)
CO2: 22 mmol/L (ref 22–32)
Calcium: 8.9 mg/dL (ref 8.9–10.3)
Chloride: 109 mmol/L (ref 98–111)
Creatinine, Ser: 2.34 mg/dL — ABNORMAL HIGH (ref 0.44–1.00)
GFR, Estimated: 20 mL/min — ABNORMAL LOW (ref >60)
Glucose, Bld: 159 mg/dL — ABNORMAL HIGH (ref 70–99)
Potassium: 4.1 mmol/L (ref 3.5–5.1)
Sodium: 138 mmol/L (ref 135–145)
Total Bilirubin: 0.6 mg/dL (ref 0.0–1.2)
Total Protein: 6.4 g/dL — ABNORMAL LOW (ref 6.5–8.1)

## 2023-10-04 MED ORDER — LACTATED RINGERS IV SOLN: INTRAVENOUS | Status: AC
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
## 2023-10-05 DIAGNOSIS — I2510 Atherosclerotic heart disease of native coronary artery without angina pectoris: Secondary | ICD-10-CM | POA: Diagnosis not present

## 2023-10-05 DIAGNOSIS — E785 Hyperlipidemia, unspecified: Secondary | ICD-10-CM | POA: Diagnosis not present

## 2023-10-05 DIAGNOSIS — M6281 Muscle weakness (generalized): Secondary | ICD-10-CM | POA: Diagnosis not present

## 2023-10-05 DIAGNOSIS — E1122 Type 2 diabetes mellitus with diabetic chronic kidney disease: Secondary | ICD-10-CM | POA: Diagnosis not present

## 2023-10-05 DIAGNOSIS — I10 Essential (primary) hypertension: Secondary | ICD-10-CM | POA: Diagnosis not present

## 2023-10-05 DIAGNOSIS — B0229 Other postherpetic nervous system involvement: Secondary | ICD-10-CM

## 2023-10-05 DIAGNOSIS — R278 Other lack of coordination: Secondary | ICD-10-CM | POA: Diagnosis not present

## 2023-10-05 DIAGNOSIS — S069XAA Unspecified intracranial injury with loss of consciousness status unknown, initial encounter: Secondary | ICD-10-CM | POA: Diagnosis not present

## 2023-10-05 DIAGNOSIS — N184 Chronic kidney disease, stage 4 (severe): Secondary | ICD-10-CM | POA: Diagnosis not present

## 2023-10-05 DIAGNOSIS — G9341 Metabolic encephalopathy: Secondary | ICD-10-CM | POA: Diagnosis not present

## 2023-10-05 LAB — COMPREHENSIVE METABOLIC PANEL WITH GFR
ALT: 21 U/L (ref 0–44)
AST: 33 U/L (ref 15–41)
Albumin: 2.7 g/dL — ABNORMAL LOW (ref 3.5–5.0)
Alkaline Phosphatase: 65 U/L (ref 38–126)
Anion gap: 10 (ref 5–15)
BUN: 36 mg/dL — ABNORMAL HIGH (ref 8–23)
CO2: 20 mmol/L — ABNORMAL LOW (ref 22–32)
Calcium: 9.2 mg/dL (ref 8.9–10.3)
Chloride: 108 mmol/L (ref 98–111)
Creatinine, Ser: 1.97 mg/dL — ABNORMAL HIGH (ref 0.44–1.00)
GFR, Estimated: 25 mL/min — ABNORMAL LOW (ref >60)
Glucose, Bld: 105 mg/dL — ABNORMAL HIGH (ref 70–99)
Potassium: 4.2 mmol/L (ref 3.5–5.1)
Sodium: 138 mmol/L (ref 135–145)
Total Bilirubin: 0.7 mg/dL (ref 0.0–1.2)
Total Protein: 6.9 g/dL (ref 6.5–8.1)

## 2023-10-05 LAB — CBC
HCT: 29.1 % — ABNORMAL LOW (ref 36.0–46.0)
Hemoglobin: 9.2 g/dL — ABNORMAL LOW (ref 12.0–15.0)
MCH: 30.2 pg (ref 26.0–34.0)
MCHC: 31.6 g/dL (ref 30.0–36.0)
MCV: 95.4 fL (ref 80.0–100.0)
Platelets: 308 10*3/uL (ref 150–400)
RBC: 3.05 MIL/uL — ABNORMAL LOW (ref 3.87–5.11)
RDW: 13.1 % (ref 11.5–15.5)
WBC: 8.6 10*3/uL (ref 4.0–10.5)
nRBC: 0 % (ref 0.0–0.2)

## 2023-10-05 MED ORDER — QUETIAPINE FUMARATE 25 MG PO TABS: 25.0000 mg | ORAL_TABLET | Freq: Every day | ORAL | Status: DC

## 2023-10-05 MED ORDER — GABAPENTIN 100 MG PO CAPS: 100.0000 mg | ORAL_CAPSULE | Freq: Four times a day (QID) | ORAL | 0 refills | Status: DC

## 2023-10-05 NOTE — Plan of Care (Signed)
   Problem: Education: Goal: Knowledge of General Education information will improve Description Including pain rating scale, medication(s)/side effects and non-pharmacologic comfort measures Outcome: Progressing   Problem: Health Behavior/Discharge Planning: Goal: Ability to manage health-related needs will improve Outcome: Progressing

## 2023-10-05 NOTE — TOC Transition Note (Signed)
 Transition of Care [facility]) - Discharge Note   Patient Details  Name: Shelly [name] MRN: [id number] Date of Birth: 12/30/1940  Transition of Care [facility]) CM/SW Contact:  Tandy Fam, LCSW Phone Number: 10/05/2023, 12:52 PM   Clinical Narrative:   CSW updated by MD that patient is stable for discharge to SNF today. CSW confirmed bed availability with [facility]. CSW spoke with daughter, [name], she is in agreement. CSW sent discharge information to [facility], confirmed receipt. Family to provide transportation, CSW updated [name] that discharge was complete and patient is ready for transport when family is available. No further TOC needs.  Nurse to call report to [phone], Room A9-1    Final next level of care: Skilled Nursing Facility Barriers to Discharge: Barriers Resolved   Patient Goals and CMS Choice            Discharge Placement              Patient chooses bed at:  [facility]) Patient to be transferred to facility by: Family Name of family member notified: [name] Patient and family notified of of transfer: 10/05/23  Discharge Plan and Services Additional resources added to the After Visit Summary for                                       Social Drivers of Health (SDOH) Interventions SDOH Screenings   Food Insecurity: No Food Insecurity (09/29/2023)  Housing: Low Risk  (09/29/2023)  Transportation Needs: No Transportation Needs (09/29/2023)  Utilities: Not At Risk (09/29/2023)  Alcohol Screen: Low Risk  (08/08/2021)  Depression (PHQ2-9): Medium Risk (07/31/2023)  Financial Resource Strain: Low Risk  (08/08/2021)  Physical Activity: Inactive (12/17/2021)  Social Connections: Socially Isolated (09/29/2023)  Stress: Stress Concern Present (12/17/2021)  Tobacco Use: Low Risk  (10/02/2023)     Readmission Risk Interventions    09/30/2023   10:57 AM  Readmission Risk Prevention Plan  Transportation Screening Complete  PCP or  Specialist Appt within 5-7 Days Complete  Home Care Screening Complete  Medication Review (RN CM) Referral to Pharmacy

## 2023-10-05 NOTE — Plan of Care (Signed)
  Problem: Education: Goal: Knowledge of General Education information will improve Description: Including pain rating scale, medication(s)/side effects and non-pharmacologic comfort measures 10/05/2023 1249 by Apolinar Baxter, RN Outcome: Adequate for Discharge 10/05/2023 1248 by Apolinar Baxter, RN Outcome: Progressing   Problem: Health Behavior/Discharge Planning: Goal: Ability to manage health-related needs will improve 10/05/2023 1249 by Apolinar Baxter, RN Outcome: Adequate for Discharge 10/05/2023 1248 by Apolinar Baxter, RN Outcome: Progressing   Problem: Clinical Measurements: Goal: Ability to maintain clinical measurements within normal limits will improve Outcome: Adequate for Discharge Goal: Will remain free from infection Outcome: Adequate for Discharge Goal: Diagnostic test results will improve Outcome: Adequate for Discharge Goal: Respiratory complications will improve Outcome: Adequate for Discharge Goal: Cardiovascular complication will be avoided Outcome: Adequate for Discharge   Problem: Activity: Goal: Risk for activity intolerance will decrease Outcome: Adequate for Discharge   Problem: Nutrition: Goal: Adequate nutrition will be maintained Outcome: Adequate for Discharge   Problem: Coping: Goal: Level of anxiety will decrease Outcome: Adequate for Discharge   Problem: Elimination: Goal: Will not experience complications related to bowel motility Outcome: Adequate for Discharge Goal: Will not experience complications related to urinary retention Outcome: Adequate for Discharge   Problem: Pain Managment: Goal: General experience of comfort will improve and/or be controlled Outcome: Adequate for Discharge   Problem: Safety: Goal: Ability to remain free from injury will improve Outcome: Adequate for Discharge   Problem: Skin Integrity: Goal: Risk for impaired skin integrity will decrease Outcome: Adequate for Discharge

## 2023-10-05 NOTE — Discharge Summary (Signed)
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 because of portable technique and body habitus. No gross malalignment of the pelvis or hips. No diastasis. Degenerative changes of the lumbosacral spine, SI joints and both hips. No displaced fracture. Left pelvis calcified degenerated fibroids again noted. Vascular calcifications present. IMPRESSION: Limited exam. No acute finding by plain radiography. Electronically Signed   By: Melven Stable.  Shick M.D.   On: 09/22/2023 18:16    Microbiology: Results for orders placed or performed during the hospital encounter of 08/03/23  Urine Culture     Status: Abnormal   Collection Time: 08/04/23  4:13 AM   Specimen: Urine, Clean Catch  Result Value Ref Range Status   Specimen Description   Final    URINE, CLEAN CATCH Performed at [facility], [address] [place], [place] [address]    Special Requests   Final    NONE Performed at [facility], [address] [place], [place] [address]    Culture (A)  Final    <10,000 COLONIES/mL INSIGNIFICANT GROWTH Performed at [facility], [address], [place], [place] [address]    Report Status 08/05/2023 FINAL  Final    Labs: CBC: Recent Labs  Lab 10/01/23 0446 10/02/23 0626 10/03/23 0528  10/04/23 0802 10/05/23 0545  WBC 8.5 8.6 8.3 7.5 8.6  HGB 10.0* 9.7* 10.1* 8.7* 9.2*  HCT 31.6* 30.9* 31.5* 27.7* 29.1*  MCV 95.5 95.4 95.7 96.9 95.4  PLT 303 298 314 299 308   Basic Metabolic Panel: Recent Labs  Lab 09/30/23 0502 10/01/23 0446 10/02/23 0626 10/03/23 0528 10/04/23 0802 10/05/23 0545  NA 139 139 137 138 138 138  K 4.1 4.2 4.5 4.3 4.1 4.2  CL 111 110 109 109 109 108  CO2 19* 19* 20* 20* 22 20*  GLUCOSE 149* 104* 101* 118* 159* 105*  BUN 35* 29* 35* 45* 43* 36*  CREATININE 1.97* 1.80* 2.04* 2.13* 2.34* 1.97*  CALCIUM  8.9 9.4 9.0 8.8* 8.9 9.2  MG 2.5*  --   --   --   --   --   PHOS 4.1  --   --   --   --   --    Liver Function Tests: Recent Labs  Lab 10/01/23 0446 10/02/23 0626 10/03/23 0528 10/04/23 0802 10/05/23 0545  AST 19 24 23  43* 33  ALT 14 17 17 20 21   ALKPHOS 61 63 66 59 65  BILITOT 0.8 0.8 0.6 0.6 0.7  PROT 7.2 7.0 7.0 6.4* 6.9  ALBUMIN 2.9* 2.9* 2.7* 2.5* 2.7*   CBG: Recent Labs  Lab 09/30/23 0115  GLUCAP 161*    Discharge time spent: less than 30 minutes.  Signed: Cherylle Corwin, MD [facility] 10/05/2023

## 2023-10-05 NOTE — Progress Notes (Signed)
 Mobility Specialist Progress Note:    10/05/23 0800  Mobility  Activity Transferred to/from [facility];Transferred from bed to chair  Level of Assistance Minimal assist, patient does 75% or more  Assistive Device Front wheel walker  Distance Ambulated (ft) 8 ft  Activity Response Tolerated well  Mobility Referral Yes  Mobility visit 1 Mobility  Mobility Specialist Start Time (ACUTE ONLY) [id number]  Mobility Specialist Stop Time (ACUTE ONLY) 0829  Mobility Specialist Time Calculation (min) (ACUTE ONLY) 15 min   Pt received in bed and agreeable. Required minA to stand pivot to [facility]. Void unsuccessful. Pt then able to stand and take steps to chair w/ minA. No complaints throughout. Pt left in chair with call bell and all needs met. Chair alarm on.  Shelly [name] Mobility Specialist Please contact via Special educational needs teacher or Rehab office at [phone]

## 2023-10-08 DIAGNOSIS — G9341 Metabolic encephalopathy: Secondary | ICD-10-CM | POA: Diagnosis not present

## 2023-10-08 DIAGNOSIS — S069XAA Unspecified intracranial injury with loss of consciousness status unknown, initial encounter: Secondary | ICD-10-CM | POA: Diagnosis not present

## 2023-10-13 ENCOUNTER — Telehealth: Payer: Self-pay | Admitting: Family Medicine
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
## 2023-10-13 NOTE — Telephone Encounter (Signed)
 Lmtcb

## 2023-10-14 ENCOUNTER — Other Ambulatory Visit: Payer: Self-pay

## 2023-10-15 NOTE — Telephone Encounter (Signed)
 Spoke with pt. She is being d/c tomorrow. She will follow up when needed.

## 2023-10-19 ENCOUNTER — Ambulatory Visit: Payer: Self-pay
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
## 2023-10-20 ENCOUNTER — Telehealth: Payer: Self-pay | Admitting: Family Medicine

## 2023-10-20 NOTE — Telephone Encounter (Unsigned)
 Copied from CRM [id number]. Topic: General - Other >> Oct 19, 2023  4:36 PM Chuck Crater wrote: Reason for CRM: [facility] recieved a referral for patient Skilled nursing, Physical and Occupational Therapy.

## 2023-10-23 ENCOUNTER — Encounter: Payer: Self-pay | Admitting: Family Medicine

## 2023-10-23 ENCOUNTER — Ambulatory Visit: Admitting: Family Medicine

## 2023-10-23 VITALS — BP 139/72 | HR 76 | Temp 98.0°F | Ht 62.0 in | Wt 224.0 lb

## 2023-10-23 DIAGNOSIS — F0781 Postconcussional syndrome: Secondary | ICD-10-CM | POA: Diagnosis not present

## 2023-10-23 DIAGNOSIS — G9341 Metabolic encephalopathy: Secondary | ICD-10-CM
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
## 2023-10-24 LAB — CBC WITH DIFFERENTIAL/PLATELET
Basophils Absolute: 0 10*3/uL (ref 0.0–0.2)
Basos: 1 %
EOS (ABSOLUTE): 0.1 10*3/uL (ref 0.0–0.4)
Eos: 2 %
Hematocrit: 29.8 % — ABNORMAL LOW (ref 34.0–46.6)
Hemoglobin: 9.3 g/dL — ABNORMAL LOW (ref 11.1–15.9)
Immature Grans (Abs): 0 10*3/uL (ref 0.0–0.1)
Immature Granulocytes: 0 %
Lymphocytes Absolute: 2 10*3/uL (ref 0.7–3.1)
Lymphs: 34 %
MCH: 30.7 pg (ref 26.6–33.0)
MCHC: 31.2 g/dL — ABNORMAL LOW (ref 31.5–35.7)
MCV: 98 fL — ABNORMAL HIGH (ref 79–97)
Monocytes Absolute: 0.7 10*3/uL (ref 0.1–0.9)
Monocytes: 12 %
Neutrophils Absolute: 3 10*3/uL (ref 1.4–7.0)
Neutrophils: 51 %
Platelets: 286 10*3/uL (ref 150–450)
RBC: 3.03 x10E6/uL — ABNORMAL LOW (ref 3.77–5.28)
RDW: 13.2 % (ref 11.7–15.4)
WBC: 5.8 10*3/uL (ref 3.4–10.8)

## 2023-10-24 LAB — CMP14+EGFR
ALT: 18 IU/L (ref 0–32)
AST: 17 IU/L (ref 0–40)
Albumin: 3.5 g/dL — ABNORMAL LOW (ref 3.7–4.7)
Alkaline Phosphatase: 88 IU/L (ref 44–121)
BUN/Creatinine Ratio: 15 (ref 12–28)
BUN: 29 mg/dL — ABNORMAL HIGH (ref 8–27)
Bilirubin Total: 0.3 mg/dL (ref 0.0–1.2)
CO2: 19 mmol/L — ABNORMAL LOW (ref 20–29)
Calcium: 9.8 mg/dL (ref 8.7–10.3)
Chloride: 110 mmol/L — ABNORMAL HIGH (ref 96–106)
Creatinine, Ser: 1.9 mg/dL — ABNORMAL HIGH (ref 0.57–1.00)
Globulin, Total: 3.4 g/dL (ref 1.5–4.5)
Glucose: 95 mg/dL (ref 70–99)
Potassium: 4.9 mmol/L (ref 3.5–5.2)
Sodium: 144 mmol/L (ref 134–144)
Total Protein: 6.9 g/dL (ref 6.0–8.5)
eGFR: 26 mL/min/{1.73_m2} — ABNORMAL LOW (ref >59)

## 2023-10-27 ENCOUNTER — Ambulatory Visit: Payer: Self-pay | Admitting: Family Medicine

## 2023-10-27 NOTE — Progress Notes (Signed)
 Cbc is stable. I recommend she continue to follow with PCP to monitor. GFR low, but stable. Recommend follow up with PCP and nephrology. Albumin remains low, recommend increasing protein in diet.

## 2023-11-02 ENCOUNTER — Encounter: Payer: Self-pay | Admitting: Family Medicine

## 2023-11-02 ENCOUNTER — Ambulatory Visit: Admitting: Family Medicine

## 2023-11-02 VITALS — BP 151/72 | HR 69 | Temp 98.2°F | Ht 62.0 in | Wt 224.0 lb

## 2023-11-02 DIAGNOSIS — N1831 Chronic kidney disease, stage 3a: Secondary | ICD-10-CM | POA: Diagnosis not present

## 2023-11-02 DIAGNOSIS — N184 Chronic kidney disease, stage 4 (severe): Secondary | ICD-10-CM

## 2023-11-02 DIAGNOSIS — I10 Essential (primary) hypertension: Secondary | ICD-10-CM | POA: Diagnosis not present

## 2023-11-02 DIAGNOSIS — E0590 Thyrotoxicosis, unspecified without thyrotoxic crisis or storm: Secondary | ICD-10-CM | POA: Diagnosis not present

## 2023-11-02 DIAGNOSIS — E1122 Type 2 diabetes mellitus with diabetic chronic kidney disease: Secondary | ICD-10-CM
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
## 2023-11-09 ENCOUNTER — Encounter: Payer: Self-pay | Admitting: Gastroenterology

## 2023-11-23 ENCOUNTER — Ambulatory Visit: Admitting: Cardiology

## 2023-11-30 ENCOUNTER — Ambulatory Visit: Admitting: Nurse Practitioner

## 2023-11-30 DIAGNOSIS — E0590 Thyrotoxicosis, unspecified without thyrotoxic crisis or storm: Secondary | ICD-10-CM | POA: Diagnosis not present

## 2023-12-01 ENCOUNTER — Telehealth (INDEPENDENT_AMBULATORY_CARE_PROVIDER_SITE_OTHER): Payer: Self-pay | Admitting: *Deleted

## 2023-12-01 LAB — T3, FREE: T3, Free: 3.2 pg/mL (ref 2.0–4.4)

## 2023-12-01 LAB — T4, FREE: Free T4: 1.03 ng/dL (ref 0.82–1.77)

## 2023-12-01 LAB — TSH: TSH: 0.347 u[IU]/mL — ABNORMAL LOW (ref 0.450–4.500)

## 2023-12-01 NOTE — Telephone Encounter (Signed)
 11/30/2023 Voicemail at 7:55 Tammy Clifton's phone   She has some questions about her medications.  Would like a call back.

## 2023-12-02 NOTE — Telephone Encounter (Signed)
Phoned the pt and LMOVM for the pt to return call 

## 2023-12-04 DIAGNOSIS — I10 Essential (primary) hypertension: Secondary | ICD-10-CM | POA: Diagnosis not present

## 2023-12-04 DIAGNOSIS — E211 Secondary hyperparathyroidism, not elsewhere classified: Secondary | ICD-10-CM | POA: Diagnosis not present

## 2023-12-04 DIAGNOSIS — N189 Chronic kidney disease, unspecified: Secondary | ICD-10-CM | POA: Diagnosis not present

## 2023-12-04 DIAGNOSIS — D631 Anemia in chronic kidney disease: Secondary | ICD-10-CM | POA: Diagnosis not present

## 2023-12-04 DIAGNOSIS — R809 Proteinuria, unspecified: Secondary | ICD-10-CM | POA: Diagnosis not present

## 2023-12-07 NOTE — Telephone Encounter (Signed)
Phoned and LMOVM for the pt to return call 

## 2023-12-07 NOTE — Telephone Encounter (Signed)
 Pt returned the call and states she will speak with the Dr when she comes in on the 23rd

## 2023-12-08 DIAGNOSIS — B351 Tinea unguium: Secondary | ICD-10-CM | POA: Diagnosis not present

## 2023-12-08 DIAGNOSIS — S069XAA Unspecified intracranial injury with loss of consciousness status unknown, initial encounter: Secondary | ICD-10-CM | POA: Diagnosis not present

## 2023-12-08 DIAGNOSIS — M79675 Pain in left toe(s): Secondary | ICD-10-CM | POA: Diagnosis not present

## 2023-12-08 DIAGNOSIS — J441 Chronic obstructive pulmonary disease with (acute) exacerbation: Secondary | ICD-10-CM | POA: Diagnosis not present

## 2023-12-08 DIAGNOSIS — L84 Corns and callosities: Secondary | ICD-10-CM | POA: Diagnosis not present

## 2023-12-08 DIAGNOSIS — M79674 Pain in right toe(s): Secondary | ICD-10-CM | POA: Diagnosis not present

## 2023-12-08 NOTE — Progress Notes (Deleted)
 GI Office Note    Referring Provider: [name], Fonda LABOR, MD Primary Care Physician:  [name], Fonda LABOR, MD  Primary Gastroenterologist: Ozell Hollingshead, MD   Chief Complaint   No chief complaint on file.   History of Present Illness   Shelly [name] is a 83 y.o. female presenting today for 3 month follow up. Last seen in 08/2023. She has chronic GERD, diverticular bleed many years ago, IDA with capsule study showing enteritis due to ASA us , chronic normocytic anemia with chronic disease component. Patient prefers frequent visits every 3 months.     CT chest/abd/pelvis without contrast 09/2023 IMPRESSION: -No acute findings or significant traumatic injury in the chest, abdomen or pelvis. -Coronary artery disease, aortic atherosclerosis. -Bilateral lower lobe atelectasis or scarring. -nodularity within left adrenal gland, likely hyperplasia or adenoma  -Colonic diverticulosis. -Fibroid uterus. -2.2 cm right thyroid  nodule. Recommend non emergent thyroid  US  (ref:J Am Coll Radiol. 2015 Feb;12(2): 143-50).  EGD 11/2015: -normal esophagus -normal stomach -normal examined duodenum  Colonoscopy 11/2015: -diverticulosis -blood in recto-sigmoid colon, sigmoid colon, desc colon, trv colon -examined portion of ileum normal -internal hemorrhoids  Small bowel capsule study 07/2016: -enteritis due to ASA use  Medications   Current Outpatient Medications  Medication Sig Dispense Refill   acetaminophen  (TYLENOL ) 500 MG tablet Take 2 tablets (1,000 mg total) by mouth every 6 (six) hours. 30 tablet 0   amitriptyline  (ELAVIL ) 25 MG tablet TAKE ONE TABLET BY MOUTH AT BEDTIME 90 tablet 0   baclofen  (LIORESAL ) 10 MG tablet Take 10 mg by mouth in the morning.     buPROPion  (WELLBUTRIN  XL) 150 MG 24 hr tablet Take 1 tablet (150 mg total) by mouth daily. (Patient taking differently: Take 150 mg by mouth in the morning.) 90 tablet 3   chlorthalidone  (HYGROTON ) 25 MG tablet Take 12.5  mg by mouth daily.     clopidogrel  (PLAVIX ) 75 MG tablet Take 1 tablet (75 mg total) by mouth daily. (Patient taking differently: Take 75 mg by mouth in the morning.) 90 tablet 3   ezetimibe  (ZETIA ) 10 MG tablet Take 1 tablet (10 mg total) by mouth daily. (Patient taking differently: Take 10 mg by mouth in the morning.) 30 tablet 6   gabapentin  (NEURONTIN ) 100 MG capsule Take 1 capsule (100 mg total) by mouth 4 (four) times daily. 10 capsule 0   hydrALAZINE  (APRESOLINE ) 25 MG tablet Take 1 tablet (25 mg total) by mouth 2 (two) times daily. (Patient taking differently: Take 25 mg by mouth at bedtime.) 60 tablet 3   hydrocortisone  (ANUSOL -HC) 2.5 % rectal cream Place 1 Application rectally 2 (two) times daily. As needed for rectal discomfort. 30 g 1   metoprolol  succinate (TOPROL -XL) 50 MG 24 hr tablet Take 1 tablet (50 mg total) by mouth daily. Take with or immediately following a meal. (Patient taking differently: Take 50 mg by mouth at bedtime. Take with or immediately following a meal.) 30 tablet 3   montelukast  (SINGULAIR ) 10 MG tablet Take 1 tablet (10 mg total) by mouth at bedtime. 90 tablet 3   Multiple Vitamin (MULTIVITAMIN) tablet Take 1 tablet by mouth in the morning.     QUEtiapine  (SEROQUEL ) 25 MG tablet Take 1 tablet (25 mg total) by mouth at bedtime.     rosuvastatin  (CRESTOR ) 40 MG tablet TAKE ONE TABLET BY MOUTH ONCE DAILY (Patient taking differently: Take 40 mg by mouth in the morning.) 90 tablet 3   No current facility-administered medications for this visit.  Allergies   Allergies as of 12/09/2023 - Review Complete 11/02/2023  Allergen Reaction Noted   Nsaids Other (See Comments) 07/25/2016   Atorvastatin  Other (See Comments) 11/30/2018     Past Medical History   Past Medical History:  Diagnosis Date   Allergy    Anemia    GI bleed   Anginal pain (HCC)    Anxiety    Arthritis    Blood transfusion without reported diagnosis    CAD (coronary artery disease)    a.  s/p NSTEMI in 2015 with DES x2 to RCA   Cataract    Chronic kidney disease    Coronary artery disease    SEVERE   GERD (gastroesophageal reflux disease)    GI bleed 12/2013   Goiter    Hyperlipidemia    Hypertension    Myocardial infarct [facility])    Myocardial infarction (HCC) 08/2013   Neuralgia of right lower extremity 07/13/2017   Pancolonic diverticulosis 12/2013   Post herpetic neuralgia     Past Surgical History   Past Surgical History:  Procedure Laterality Date   CARPAL TUNNEL RELEASE Bilateral    COLONOSCOPY N/A 01/13/2014   Dr. Hung:pandiverticulosis    COLONOSCOPY N/A 12/14/2015   [name]: pancolonic diverticulosis, internal hemorrhoids    CORNEAL TRANSPLANT     CORONARY ANGIOPLASTY  08/2013   ESOPHAGOGASTRODUODENOSCOPY N/A 12/14/2015   [name]: normal    GIVENS CAPSULE STUDY N/A 08/13/2016   [name]: enteritis due to aspirin .    HEEL SPUR EXCISION     LEFT HEART CATHETERIZATION WITH CORONARY ANGIOGRAM N/A 08/23/2013   Procedure: LEFT HEART CATHETERIZATION WITH CORONARY ANGIOGRAM;  Surgeon: [name], MD;  Location[facility] CATH LAB;  Service: Cardiovascular;  Laterality: N/A;   PERCUTANEOUS CORONARY STENT INTERVENTION (PCI-S) N/A 08/24/2013   Procedure: PERCUTANEOUS CORONARY STENT INTERVENTION (PCI-S);  Surgeon: [name], MD;  Location: [facility] CATH LAB;  Service: Cardiovascular;  Laterality: N/A;   TONSILLECTOMY     TUBAL LIGATION      Past Family History   Family History  Problem Relation Age of Onset   Cancer Mother        unsure of type    Other Father        grangrene    Asthma Sister    Stroke Sister    COPD Sister    Stroke Sister    Hypertension Brother    Transient ischemic attack Brother    Early death Brother    Early death Brother    Liver cancer Daughter    Cancer Daughter    Pancreatic disease Daughter        pancreatectomy   Cancer Daughter    Diverticulitis Daughter    Arthritis Daughter        knee    Arthritis Daughter         shoulder    Anemia Daughter    Hernia Son        Umbilical   GI problems Son    Liver cancer Son    GER disease Son    Colon cancer Neg Hx    Colon polyps Neg Hx     Past Social History   Social History   Socioeconomic History   Marital status: Divorced    Spouse name: Not on file   Number of children: 8   Years of education: 11   Highest education level: 11th grade  Occupational History   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   No daily caffeine use.   2 story home but she only has to use one level   Social Drivers of Corporate investment banker Strain: Low Risk  (08/08/2021)   Overall Financial Resource Strain (CARDIA)    Difficulty of Paying Living Expenses: Not hard at all  Food Insecurity: No Food Insecurity (09/29/2023)   Hunger Vital Sign    Worried About Running Out of Food in the Last Year: Never true    Ran Out of Food in the Last Year: Never true  Transportation Needs: No Transportation Needs (09/29/2023)   PRAPARE - Administrator, Civil Service (Medical): No    Lack of Transportation (Non-Medical): No  Physical Activity: Inactive (12/17/2021)   Exercise Vital Sign    Days of Exercise per Week: 0 days    Minutes of Exercise per Session: 0 min  Stress: Stress Concern Present (12/17/2021)   [employer] of Occupational Health - Occupational Stress Questionnaire    Feeling of Stress : To some extent  Social Connections:  Socially Isolated (09/29/2023)   Social Connection and Isolation Panel    Frequency of Communication with Friends and Family: More than three times a week    Frequency of Social Gatherings with Friends and Family: More than three times a week    Attends Religious Services: Never    Database administrator or Organizations: No    Attends Banker Meetings: Never    Marital Status: Widowed  Intimate Partner Violence: Not At Risk (09/29/2023)   Humiliation, Afraid, Rape, and Kick questionnaire    Fear of Current or Ex-Partner: No    Emotionally Abused: No    Physically Abused: No    Sexually Abused: No    Review of Systems   General: Negative for anorexia, weight loss, fever, chills, fatigue, weakness. ENT: Negative for hoarseness, difficulty swallowing , nasal congestion. CV: Negative for chest pain, angina, palpitations, dyspnea on exertion, peripheral edema.  Respiratory: Negative for dyspnea at rest, dyspnea on exertion, cough, sputum, wheezing.  GI: See history of present illness. GU:  Negative for dysuria, hematuria, urinary incontinence, urinary frequency, nocturnal urination.  Endo: Negative for unusual weight change.     Physical Exam   There were no vitals taken for this visit.   General: Well-nourished, well-developed in no acute distress.  Eyes: No icterus. Mouth: Oropharyngeal mucosa moist and pink   Lungs: Clear to auscultation bilaterally.  Heart: Regular rate and rhythm, no murmurs rubs or gallops.  Abdomen: Bowel sounds are normal, nontender, nondistended, no hepatosplenomegaly or masses,  no abdominal bruits or hernia , no rebound or guarding.  Rectal: not performed Extremities: No lower extremity edema. No clubbing or deformities. Neuro: Alert and oriented x 4   Skin: Warm and dry, no jaundice.   Psych: Alert and cooperative, normal mood and affect.  Labs   Lab Results  Component Value Date   TSH 0.347 (L) 11/30/2023   Lab Results  Component  Value Date   TSH 0.347 (L) 11/30/2023   T4TOTAL 6.1 01/31/2022   THYROIDAB <1 05/30/2019    Lab Results  Component Value Date   HGBA1C 5.8 (H) 07/31/2023   Lab Results  Component Value Date   NA 144 10/23/2023   CL 110 (H) 10/23/2023   K 4.9 10/23/2023   CO2 19 (L) 10/23/2023   BUN 29 (H) 10/23/2023   CREATININE 1.90 (H) 10/23/2023   EGFR 26 (L) 10/23/2023   CALCIUM  9.8 10/23/2023   PHOS 4.1 09/30/2023   ALBUMIN 3.5 (L) 10/23/2023   GLUCOSE 95 10/23/2023   Lab Results  Component Value Date   ALT 18 10/23/2023   AST 17 10/23/2023   ALKPHOS 88 10/23/2023   BILITOT 0.3 10/23/2023   Lab Results  Component Value Date   WBC 5.8 10/23/2023   HGB 9.3 (L) 10/23/2023   HCT 29.8 (L) 10/23/2023   MCV 98 (H) 10/23/2023   PLT 286 10/23/2023   Lab Results  Component Value Date   VITAMINB12 451 09/29/2023       Imaging Studies   No results found.  Assessment/Plan:        PIERRETTE Sonny RAMAN. Ezzard, MHS, PA-C [facility]

## 2023-12-09 ENCOUNTER — Ambulatory Visit: Admitting: Nurse Practitioner

## 2023-12-09 ENCOUNTER — Encounter: Payer: Self-pay | Admitting: Nurse Practitioner

## 2023-12-09 ENCOUNTER — Ambulatory Visit: Admitting: Gastroenterology

## 2023-12-09 ENCOUNTER — Telehealth: Payer: Self-pay | Admitting: Gastroenterology

## 2023-12-09 VITALS — BP 138/70 | HR 68 | Ht 62.0 in | Wt 221.0 lb

## 2023-12-09 DIAGNOSIS — E0590 Thyrotoxicosis, unspecified without thyrotoxic crisis or storm: Secondary | ICD-10-CM | POA: Diagnosis not present

## 2023-12-09 DIAGNOSIS — D631 Anemia in chronic kidney disease: Secondary | ICD-10-CM | POA: Diagnosis not present

## 2023-12-09 DIAGNOSIS — E1129 Type 2 diabetes mellitus with other diabetic kidney complication: Secondary | ICD-10-CM | POA: Diagnosis not present

## 2023-12-09 DIAGNOSIS — I5032 Chronic diastolic (congestive) heart failure: Secondary | ICD-10-CM | POA: Diagnosis not present

## 2023-12-09 DIAGNOSIS — N184 Chronic kidney disease, stage 4 (severe): Secondary | ICD-10-CM | POA: Diagnosis not present
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
## 2023-12-09 NOTE — Telephone Encounter (Signed)
 Shelly [name] Matt showed up for appointment after 10 minutes and thought her appointment was at 8:35 original appointment time was 8:30. I rescheduled her per Sonny Kerns.

## 2023-12-14 ENCOUNTER — Telehealth: Payer: Self-pay | Admitting: Neurology

## 2023-12-14 ENCOUNTER — Institutional Professional Consult (permissible substitution): Admitting: Neurology

## 2023-12-14 NOTE — Telephone Encounter (Signed)
 Pt sick, she has r/s

## 2023-12-15 ENCOUNTER — Ambulatory Visit: Attending: Cardiology | Admitting: Cardiology

## 2023-12-15 ENCOUNTER — Encounter: Payer: Self-pay | Admitting: Cardiology

## 2023-12-15 VITALS — BP 134/65 | HR 80 | Ht 62.0 in | Wt 221.2 lb

## 2023-12-15 DIAGNOSIS — E782 Mixed hyperlipidemia: Secondary | ICD-10-CM

## 2023-12-15 DIAGNOSIS — R600 Localized edema: Secondary | ICD-10-CM

## 2023-12-15 DIAGNOSIS — I10 Essential (primary) hypertension: Secondary | ICD-10-CM | POA: Diagnosis not present

## 2023-12-15 DIAGNOSIS — I2510 Atherosclerotic heart disease of native coronary artery without angina pectoris: Secondary | ICD-10-CM | POA: Diagnosis not present

## 2023-12-15 MED ORDER — EZETIMIBE 10 MG PO TABS: 10.0000 mg | ORAL_TABLET | Freq: Every day | ORAL | 2 refills | Status: DC

## 2023-12-15 MED ORDER — ROSUVASTATIN CALCIUM 40 MG PO TABS
40.0000 mg | ORAL_TABLET | Freq: Every day | ORAL | 2 refills | Status: AC
Start: 2023-12-15 — End: ?

## 2023-12-15 NOTE — Patient Instructions (Signed)
 Medication Instructions:   Zetia  & Crestor  refilled today  Continue all other medications.     Labwork:  none  Testing/Procedures:  none  Follow-Up:  6 months   Any Other Special Instructions Will Be Listed Below (If Applicable).   If you need a refill on your cardiac medications before your next appointment, please call your pharmacy.

## 2023-12-15 NOTE — Progress Notes (Signed)
 Clinical Summary Shelly [name] is a 83 y.o.female seen today for follow up of the following medical problems   1. CAD - history of NSTEMI, DES x 2 to RCA 08/2013 - she is on plavix  for secondary prevention due to bleeding on ASA per Shelly [name] notes   - no chest pains, no SOB/DOE - compliantw ith meds     2. Hyperlipidemia [name] aches on atorvastatin , has tolerated cresto   High risk patient prior MI, age >49, HTN, CKD. Goal LDL would be <55 - 09/2022 TC 151 TG 96 HDL 53 LDL 80. This was about 4 weeks after we had increased her crestor    07/2023 TC 149 TG 68 HDL 52 LDL 83-she thinks may have run out of either her zetia  or crestor , not sure which one   3. HTN - norvasc  caused LE edema, though retried during 04/24/20 appt with Shelly [name] - occasional leg swelling though infrequent   - 08/20/22 appt with renal high bp's. Norvasc  and hydralazine  were started, she states has not picked up or started this medications yet          4. Postherpetic neuralgia   5. CKD 3 - follwed by Shelly [name], reports appt coming.      6. Bilatearl LE edema - 08/2019 echo LVEF 65-70%, grade I dd, normal RV function, moderate pulm HTN PASP 40 - overall doing well with elevation  7. 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 s/p NSTEMI in 2015 with DES x2 to RCA   Cataract    Chronic kidney disease    Coronary artery disease    SEVERE   GERD (gastroesophageal reflux disease)    GI bleed 12/2013   Goiter    Hyperlipidemia    Hypertension    Myocardial infarct [name])    Myocardial infarction (HCC) 08/2013   Neuralgia of right lower extremity 07/13/2017   Pancolonic diverticulosis 12/2013   Post  herpetic neuralgia      Allergies  Allergen Reactions   Nsaids Other (See Comments)    AVOID all NSAID due to history of GI bleeding   Atorvastatin  Other (See Comments)    Joint & muscle pain     Current Outpatient Medications  Medication Sig Dispense Refill   acetaminophen  (TYLENOL ) 500 MG tablet Take 2 tablets (1,000 mg total) by mouth every 6 (six) hours. (Patient taking differently: Take 1,000 mg by mouth every 8 (eight) hours as needed. Patient states that she takes 2 in the morning, 2 at night) 30 tablet 0   amitriptyline  (ELAVIL ) 25 MG tablet TAKE ONE TABLET BY MOUTH AT BEDTIME 90 tablet 0   baclofen  (LIORESAL ) 10 MG tablet Take 10 mg by mouth in the morning.     buPROPion  (WELLBUTRIN  XL) 150 MG 24 hr tablet Take 1 tablet (150 mg total) by mouth daily. 90 tablet 3   chlorthalidone  (HYGROTON ) 25 MG tablet Take 12.5 mg by mouth daily.     clopidogrel  (PLAVIX ) 75 MG tablet Take 1 tablet (75 mg total) by mouth daily. 90 tablet 3   ezetimibe  (ZETIA ) 10 MG  tablet Take 1 tablet (10 mg total) by mouth daily. 30 tablet 6   gabapentin  (NEURONTIN ) 100 MG capsule Take 1 capsule (100 mg total) by mouth 4 (four) times daily. 10 capsule 0   hydrALAZINE  (APRESOLINE ) 25 MG tablet Take 1 tablet (25 mg total) by mouth 2 (two) times daily. 60 tablet 3   hydrocortisone  (ANUSOL -HC) 2.5 % rectal cream Place 1 Application rectally 2 (two) times daily. As needed for rectal discomfort. 30 g 1   metoprolol  succinate (TOPROL -XL) 50 MG 24 hr tablet Take 1 tablet (50 mg total) by mouth daily. Take with or immediately following a meal. 30 tablet 3   montelukast  (SINGULAIR ) 10 MG tablet Take 1 tablet (10 mg total) by mouth at bedtime. 90 tablet 3   Multiple Vitamin (MULTIVITAMIN) tablet Take 1 tablet by mouth in the morning.     QUEtiapine  (SEROQUEL ) 25 MG tablet Take 1 tablet (25 mg total) by mouth at bedtime.     rosuvastatin  (CRESTOR ) 40 MG tablet TAKE ONE TABLET BY MOUTH ONCE DAILY 90 tablet 3   No current  facility-administered medications for this visit.     Past Surgical History:  Procedure Laterality Date   CARPAL TUNNEL RELEASE Bilateral    COLONOSCOPY N/A 01/13/2014   Shelly. Hung:pandiverticulosis    COLONOSCOPY N/A 12/14/2015   Shelly. Dianna: pancolonic diverticulosis, internal hemorrhoids    CORNEAL TRANSPLANT     CORONARY ANGIOPLASTY  08/2013   ESOPHAGOGASTRODUODENOSCOPY N/A 12/14/2015   Shelly. Dianna: normal    GIVENS CAPSULE STUDY N/A 08/13/2016   Shelly [name]: enteritis due to aspirin .    HEEL SPUR EXCISION     LEFT HEART CATHETERIZATION WITH CORONARY ANGIOGRAM N/A 08/23/2013   Procedure: LEFT HEART CATHETERIZATION WITH CORONARY ANGIOGRAM;  Surgeon: Shelly M Swaziland, MD;  Location: [facility] C[facility] LAB;  Service: Cardiovascular;  Laterality: N/A;   PERCUTANEOUS CORONARY STENT INTERVENTION (PCI-S) N/A 08/24/2013   Procedure: PERCUTANEOUS CORONARY STENT INTERVENTION (PCI-S);  Surgeon: Shelly M Swaziland, MD;  [facility] C[facility] LAB;  Service: Cardiovascular;  Laterality: N/A;   TONSILLECTOMY     TUBAL LIGATION       Allergies  Allergen Reactions   Nsaids Other (See Comments)    AVOID all NSAID due to history of GI bleeding   Atorvastatin  Other (See Comments)    Joint & muscle pain      Family History  Problem Relation Age of Onset   Cancer Mother        unsure of type    Other Father        grangrene    Asthma Sister    Stroke Sister    COPD Sister    Stroke Sister    Hypertension Brother    Transient ischemic attack Brother    Early death Brother    Early death Brother    Liver cancer Daughter    Cancer Daughter    Pancreatic disease Daughter        pancreatectomy   Cancer Daughter    Diverticulitis Daughter    Arthritis Daughter        knee    Arthritis Daughter        shoulder    Anemia Daughter    Hernia Son        Umbilical   GI problems Son    Liver cancer Son    GER disease Son    Colon cancer Neg Hx    Colon polyps Neg Hx      Social History  Shelly [name]  reports that she has never smoked. She has never used smokeless tobacco. Shelly [name] reports no history of alcohol use.    Physical Examination Vitals:   12/15/23 1436 12/15/23 1513  BP: (!) 142/62 134/65  Pulse: 80   SpO2: 96%    Filed Weights   12/15/23 1436  Weight: 221 lb 3.2 oz (100.3 kg)    Gen: resting comfortably, no acute distress HEENT: no scleral icterus, pupils equal round and reactive, no palptable cervical adenopathy,  CV: RRR, no mrg, no jvd Resp: Clear to auscultation bilaterally GI: abdomen is soft, non-tender, non-distended, normal bowel sounds, no hepatosplenomegaly MSK: extremities are warm, no edema.  Skin: warm, no rash Neuro:  no focal deficits Psych: appropriate affect     Assessment and Plan  1. CAD - no symptoms, continue current meds   2. Hyperlipidemia - labs pending with pcp -refill her crestor  and zetia , continue current therapy   3. HTN -at goal, continue current meds     4.Leg edema - overall stable, she is not intersested in lymphedema clinic due to transportation limitations        Dorn PHEBE Ross, M.D.,

## 2023-12-16 ENCOUNTER — Telehealth: Payer: Self-pay

## 2023-12-16 NOTE — Progress Notes (Unsigned)
 Referring Provider: [name], Fonda LABOR, MD Primary Care Physician:  [name], Fonda LABOR, MD Primary GI Physician: Dr. Shaaron  No chief complaint on file.   HPI:   Shelly [name] is a 83 y.o. female presenting today with a history of chronic GERD, diverticular bleed many years prior, IDA with capsule study showing enteritis due to aspirin  use, and chronic normocytic anemia with chronic disease component.  She prefers frequent visits, although she is doing well. She has requested visits every 3 months.   Last seen in the office 08/25/23.  She had no significant GI symptoms.  Only taking pantoprazole  once since the beginning of the year.  No other concerns.   Today:    Past Medical History:  Diagnosis Date   Allergy    Anemia    GI bleed   Anginal pain (HCC)    Anxiety    Arthritis    Blood transfusion without reported diagnosis    CAD (coronary artery disease)    a. s/p NSTEMI in 2015 with DES x2 to RCA   Cataract    Chronic kidney disease    Coronary artery disease    SEVERE   GERD (gastroesophageal reflux disease)    GI bleed 12/2013   Goiter    Hyperlipidemia    Hypertension    Myocardial infarct [facility])    Myocardial infarction (HCC) 08/2013   Neuralgia of right lower extremity 07/13/2017   Pancolonic diverticulosis 12/2013   Post herpetic neuralgia     Past Surgical History:  Procedure Laterality Date   CARPAL TUNNEL RELEASE Bilateral    COLONOSCOPY N/A 01/13/2014   Dr. Hung:pandiverticulosis    COLONOSCOPY N/A 12/14/2015   [name]: pancolonic diverticulosis, internal hemorrhoids    CORNEAL TRANSPLANT     CORONARY ANGIOPLASTY  08/2013   ESOPHAGOGASTRODUODENOSCOPY N/A 12/14/2015   [name]: normal    GIVENS CAPSULE STUDY N/A 08/13/2016   [name]: enteritis due to aspirin .    HEEL SPUR EXCISION     LEFT HEART CATHETERIZATION WITH CORONARY ANGIOGRAM N/A 08/23/2013   Procedure: LEFT HEART CATHETERIZATION WITH CORONARY ANGIOGRAM;  Surgeon: Peter M  Swaziland, MD;  Location: [facility] CATH LAB;  Service: Cardiovascular;  Laterality: N/A;   PERCUTANEOUS CORONARY STENT INTERVENTION (PCI-S) N/A 08/24/2013   Procedure: PERCUTANEOUS CORONARY STENT INTERVENTION (PCI-S);  Surgeon: [name], MD;  Location: [facility] CATH LAB;  Service: Cardiovascular;  Laterality: N/A;   TONSILLECTOMY     TUBAL LIGATION      Current Outpatient Medications  Medication Sig Dispense Refill   acetaminophen  (TYLENOL ) 500 MG tablet Take 2 tablets (1,000 mg total) by mouth every 6 (six) hours. (Patient taking differently: Take 1,000 mg by mouth every 8 (eight) hours as needed. Patient states that she takes 2 in the morning, 2 at night) 30 tablet 0   amitriptyline  (ELAVIL ) 25 MG tablet TAKE ONE TABLET BY MOUTH AT BEDTIME 90 tablet 0   baclofen  (LIORESAL ) 10 MG tablet Take 10 mg by mouth in the morning.     buPROPion  (WELLBUTRIN  XL) 150 MG 24 hr tablet Take 1 tablet (150 mg total) by mouth daily. 90 tablet 3   chlorthalidone  (HYGROTON ) 25 MG tablet Take 12.5 mg by mouth daily.     clopidogrel  (PLAVIX ) 75 MG tablet Take 1 tablet (75 mg total) by mouth daily. 90 tablet 3   ezetimibe  (ZETIA ) 10 MG tablet Take 1 tablet (10 mg total) by mouth daily. 90 tablet 2   gabapentin  (NEURONTIN ) 100 MG  capsule Take 1 capsule (100 mg total) by mouth 4 (four) times daily. 10 capsule 0   hydrALAZINE  (APRESOLINE ) 25 MG tablet Take 1 tablet (25 mg total) by mouth 2 (two) times daily. 60 tablet 3   hydrocortisone  (ANUSOL -HC) 2.5 % rectal cream Place 1 Application rectally 2 (two) times daily. As needed for rectal discomfort. 30 g 1   metoprolol  succinate (TOPROL -XL) 50 MG 24 hr tablet Take 1 tablet (50 mg total) by mouth daily. Take with or immediately following a meal. 30 tablet 3   montelukast  (SINGULAIR ) 10 MG tablet Take 1 tablet (10 mg total) by mouth at bedtime. 90 tablet 3   Multiple Vitamin (MULTIVITAMIN) tablet Take 1 tablet by mouth in the morning.     QUEtiapine  (SEROQUEL ) 25 MG tablet Take 1  tablet (25 mg total) by mouth at bedtime.     rosuvastatin  (CRESTOR ) 40 MG tablet Take 1 tablet (40 mg total) by mouth daily. 90 tablet 2   No current facility-administered medications for this visit.    Allergies as of 12/17/2023 - Review Complete 12/15/2023  Allergen Reaction Noted   Nsaids Other (See Comments) 07/25/2016   Atorvastatin  Other (See Comments) 11/30/2018    Family History  Problem Relation Age of Onset   Cancer Mother        unsure of type    Other Father        grangrene    Asthma Sister    Stroke Sister    COPD Sister    Stroke Sister    Hypertension Brother    Transient ischemic attack Brother    Early death Brother    Early death Brother    Liver cancer Daughter    Cancer Daughter    Pancreatic disease Daughter        pancreatectomy   Cancer Daughter    Diverticulitis Daughter    Arthritis Daughter        knee    Arthritis Daughter        shoulder    Anemia Daughter    Hernia Son        Umbilical   GI problems Son    Liver cancer Son    GER disease Son    Colon cancer Neg Hx    Colon polyps Neg Hx     Social History   Socioeconomic History   Marital status: Divorced    Spouse name: Not on file   Number of children: 8   Years of education: 11   Highest education level: 11th grade  Occupational History   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   No daily caffeine use.   2 story home but she only has to use one level    Social Drivers of Corporate investment banker Strain: Low Risk  (08/08/2021)   Overall Financial Resource Strain (CARDIA)    Difficulty of Paying Living  Expenses: Not hard at all  Food Insecurity: No Food Insecurity (09/29/2023)   Hunger Vital Sign    Worried About Running Out of Food in the Last Year: Never true    Ran Out of Food in the Last Year: Never true  Transportation Needs: No Transportation Needs (09/29/2023)   PRAPARE - Administrator, Civil Service (Medical): No    Lack of Transportation (Non-Medical): No  Physical Activity: Inactive (12/17/2021)   Exercise Vital Sign    Days of Exercise per Week: 0 days    Minutes of Exercise per Session: 0 min  Stress: Stress Concern Present (12/17/2021)   [employer] of Occupational Health - Occupational Stress Questionnaire    Feeling of Stress : To some extent  Social Connections: Socially Isolated (09/29/2023)   Social Connection and Isolation Panel    Frequency of Communication with Friends and Family: More than three times a week    Frequency of Social Gatherings with Friends and Family: More than three times a week    Attends Religious Services: Never    Database administrator or Organizations: No    Attends Banker Meetings: Never    Marital Status: Widowed    Review of Systems: Gen: Denies fever, chills, anorexia. Denies fatigue, weakness, weight loss.  CV: Denies chest pain, palpitations, syncope, peripheral edema, and claudication. Resp: Denies dyspnea at rest, cough, wheezing, coughing up blood, and pleurisy. GI: Denies vomiting blood, jaundice, and fecal incontinence.   Denies dysphagia or odynophagia. Derm: Denies rash, itching, dry skin Psych: Denies depression, anxiety, memory loss, confusion. No homicidal or suicidal ideation.  Heme: Denies bruising, bleeding, and enlarged lymph nodes.  Physical Exam: There were no vitals taken for this visit. General:   Alert and oriented. No distress  noted. Pleasant and cooperative.  Head:  Normocephalic and atraumatic. Eyes:  Conjuctiva clear without scleral icterus. Heart:  S1, S2 present without murmurs appreciated. Lungs:  Clear to auscultation bilaterally. No wheezes, rales, or rhonchi. No distress.  Abdomen:  +BS, soft, non-tender and non-distended. No rebound or guarding. No HSM or masses noted. Msk:  Symmetrical without gross deformities. Normal posture. Extremities:  Without edema. Neurologic:  Alert and  oriented x4 Psych:  Normal mood and affect.    Assessment:     Plan:  ***   Josette Centers, PA-C [facility] 12/17/2023

## 2023-12-16 NOTE — Telephone Encounter (Signed)
 Copied from CRM [id number]. Topic: Clinical - Lab/Test Results >> Dec 16, 2023  1:33 PM Rosaria E wrote: Reason for CRM: Pt has questions about her labs and why she is supposed to have a blood transfusion without any discussion. She does not want to have this, and has questions.   Best contact: [phone]

## 2023-12-17 ENCOUNTER — Encounter: Payer: Self-pay | Admitting: Gastroenterology

## 2023-12-17 ENCOUNTER — Ambulatory Visit: Admitting: Gastroenterology

## 2023-12-17 VITALS — BP 138/68 | HR 74 | Temp 98.2°F | Ht 62.0 in | Wt 220.8 lb

## 2023-12-17 DIAGNOSIS — K5900 Constipation, unspecified: Secondary | ICD-10-CM

## 2023-12-17 DIAGNOSIS — K219 Gastro-esophageal reflux disease without esophagitis: Secondary | ICD-10-CM

## 2023-12-17 MED ORDER — PANTOPRAZOLE SODIUM 40 MG PO TBEC: 40.0000 mg | DELAYED_RELEASE_TABLET | Freq: Every day | ORAL | 3 refills | Status: DC

## 2023-12-17 NOTE — Telephone Encounter (Signed)
 Returned call and patient states she has it figured out already.

## 2023-12-17 NOTE — Patient Instructions (Signed)
 Continue to use pantoprazole  as needed.   Go ahead and start Metamucil for mild constipation/firm stool.  You can follow the instructions on the container.  We will have you follow-up with Therisa Stager, NP in 3 months.  It was very nice to meet you today!  Josette Centers, PA-C [facility]

## 2023-12-28 ENCOUNTER — Other Ambulatory Visit

## 2023-12-28 ENCOUNTER — Encounter: Admitting: Oncology

## 2024-01-14 ENCOUNTER — Telehealth: Payer: Self-pay | Admitting: Neurology

## 2024-01-14 NOTE — Telephone Encounter (Signed)
Patient called to verify date and time of appointment.

## 2024-01-21 ENCOUNTER — Inpatient Hospital Stay

## 2024-01-21 ENCOUNTER — Other Ambulatory Visit (HOSPITAL_COMMUNITY)
Admission: RE | Admit: 2024-01-21 | Discharge: 2024-01-21 | Disposition: A | Discharge status: Home / Self Care | Source: Ambulatory Visit | Attending: Nephrology | Admitting: Nephrology

## 2024-01-21 ENCOUNTER — Inpatient Hospital Stay: Admitting: Oncology

## 2024-01-21 ENCOUNTER — Inpatient Hospital Stay (HOSPITAL_COMMUNITY): Admit: 2024-01-21

## 2024-01-21 DIAGNOSIS — D7589 Other specified diseases of blood and blood-forming organs: Secondary | ICD-10-CM | POA: Diagnosis not present

## 2024-01-21 DIAGNOSIS — R809 Proteinuria, unspecified: Secondary | ICD-10-CM | POA: Insufficient documentation

## 2024-01-21 DIAGNOSIS — N189 Chronic kidney disease, unspecified: Secondary | ICD-10-CM | POA: Diagnosis not present

## 2024-01-21 DIAGNOSIS — E211 Secondary hyperparathyroidism, not elsewhere classified: Secondary | ICD-10-CM | POA: Insufficient documentation

## 2024-01-21 DIAGNOSIS — D631 Anemia in chronic kidney disease: Secondary | ICD-10-CM | POA: Insufficient documentation

## 2024-01-21 LAB — RENAL FUNCTION PANEL
Albumin: 3.6 g/dL (ref 3.5–5.0)
Anion gap: 9 (ref 5–15)
BUN: 28 mg/dL — ABNORMAL HIGH (ref 8–23)
CO2: 24 mmol/L (ref 22–32)
Calcium: 9.5 mg/dL (ref 8.9–10.3)
Chloride: 108 mmol/L (ref 98–111)
Creatinine, Ser: 2.39 mg/dL — ABNORMAL HIGH (ref 0.44–1.00)
GFR, Estimated: 20 mL/min — ABNORMAL LOW (ref >60)
Glucose, Bld: 93 mg/dL (ref 70–99)
Phosphorus: <1.1 mg/dL — ABNORMAL LOW (ref 2.5–4.6)
Potassium: 4.1 mmol/L (ref 3.5–5.1)
Sodium: 141 mmol/L (ref 135–145)

## 2024-01-21 LAB — IRON AND TIBC
Iron: 58 ug/dL (ref 28–170)
Saturation Ratios: 21 % (ref 10.4–31.8)
TIBC: 276 ug/dL (ref 250–450)
UIBC: 218 ug/dL

## 2024-01-21 LAB — PROTEIN / CREATININE RATIO, URINE
Creatinine, Urine: 175 mg/dL
Protein Creatinine Ratio: 0.14 mg/mg{creat} (ref 0.00–0.15)
Total Protein, Urine: 24 mg/dL

## 2024-01-21 LAB — CBC
HCT: 35.4 % — ABNORMAL LOW (ref 36.0–46.0)
Hemoglobin: 11 g/dL — ABNORMAL LOW (ref 12.0–15.0)
MCH: 30.8 pg (ref 26.0–34.0)
MCHC: 31.1 g/dL (ref 30.0–36.0)
MCV: 99.2 fL (ref 80.0–100.0)
Platelets: 297 K/uL (ref 150–400)
RBC: 3.57 MIL/uL — ABNORMAL LOW (ref 3.87–5.11)
RDW: 14.4 % (ref 11.5–15.5)
WBC: 5.9 K/uL (ref 4.0–10.5)
nRBC: 0 % (ref 0.0–0.2)

## 2024-01-21 LAB — VITAMIN B12: Vitamin B-12: 327 pg/mL (ref 180–914)

## 2024-01-21 LAB — FERRITIN: Ferritin: 50 ng/mL (ref 11–307)

## 2024-01-21 LAB — FOLATE: Folate: >20 ng/mL (ref >5.9)

## 2024-01-22 DIAGNOSIS — R809 Proteinuria, unspecified: Secondary | ICD-10-CM | POA: Diagnosis not present

## 2024-01-22 DIAGNOSIS — N184 Chronic kidney disease, stage 4 (severe): Secondary | ICD-10-CM | POA: Diagnosis not present

## 2024-01-22 DIAGNOSIS — E1122 Type 2 diabetes mellitus with diabetic chronic kidney disease: Secondary | ICD-10-CM | POA: Diagnosis not present

## 2024-01-22 DIAGNOSIS — E1129 Type 2 diabetes mellitus with other diabetic kidney complication: Secondary | ICD-10-CM | POA: Diagnosis not present

## 2024-01-22 LAB — PARATHYROID HORMONE, INTACT (NO CA): PTH: 56 pg/mL (ref 15–65)

## 2024-01-26 ENCOUNTER — Other Ambulatory Visit: Payer: Self-pay | Admitting: Family Medicine

## 2024-02-02 ENCOUNTER — Other Ambulatory Visit: Payer: Self-pay | Admitting: Family Medicine

## 2024-02-02 DIAGNOSIS — B0229 Other postherpetic nervous system involvement: Secondary | ICD-10-CM

## 2024-02-04 ENCOUNTER — Ambulatory Visit: Admitting: Family Medicine

## 2024-02-04 ENCOUNTER — Encounter: Payer: Self-pay | Admitting: Family Medicine

## 2024-02-04 VITALS — BP 135/60 | HR 78 | Ht 62.0 in | Wt 220.0 lb

## 2024-02-04 DIAGNOSIS — B0229 Other postherpetic nervous system involvement: Secondary | ICD-10-CM

## 2024-02-04 DIAGNOSIS — E0590 Thyrotoxicosis, unspecified without thyrotoxic crisis or storm: Secondary | ICD-10-CM

## 2024-02-04 DIAGNOSIS — N184 Chronic kidney disease, stage 4 (severe): Secondary | ICD-10-CM

## 2024-02-04 DIAGNOSIS — I10 Essential (primary) hypertension: Secondary | ICD-10-CM | POA: Diagnosis not present

## 2024-02-04 DIAGNOSIS — E1122 Type 2 diabetes mellitus with diabetic chronic kidney disease: Secondary | ICD-10-CM

## 2024-02-04 DIAGNOSIS — N1831 Chronic kidney disease, stage 3a: Secondary | ICD-10-CM | POA: Diagnosis not present

## 2024-02-04 LAB — BAYER DCA HB A1C WAIVED: HB A1C (BAYER DCA - WAIVED): 5.8 % — ABNORMAL HIGH (ref 4.8–5.6)
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
## 2024-02-05 LAB — LIPID PANEL
Chol/HDL Ratio: 2.5 ratio (ref 0.0–4.4)
Cholesterol, Total: 139 mg/dL (ref 100–199)
HDL: 56 mg/dL (ref >39)
LDL Chol Calc (NIH): 67 mg/dL (ref 0–99)
Triglycerides: 85 mg/dL (ref 0–149)
VLDL Cholesterol Cal: 16 mg/dL (ref 5–40)

## 2024-02-05 LAB — CBC WITH DIFFERENTIAL/PLATELET
Basophils Absolute: 0 x10E3/uL (ref 0.0–0.2)
Basos: 1 %
EOS (ABSOLUTE): 0.1 x10E3/uL (ref 0.0–0.4)
Eos: 1 %
Hematocrit: 35.8 % (ref 34.0–46.6)
Hemoglobin: 11.1 g/dL (ref 11.1–15.9)
Immature Grans (Abs): 0 x10E3/uL (ref 0.0–0.1)
Immature Granulocytes: 0 %
Lymphocytes Absolute: 2.1 x10E3/uL (ref 0.7–3.1)
Lymphs: 42 %
MCH: 30.2 pg (ref 26.6–33.0)
MCHC: 31 g/dL — ABNORMAL LOW (ref 31.5–35.7)
MCV: 97 fL (ref 79–97)
Monocytes Absolute: 0.5 x10E3/uL (ref 0.1–0.9)
Monocytes: 10 %
Neutrophils Absolute: 2.3 x10E3/uL (ref 1.4–7.0)
Neutrophils: 46 %
Platelets: 299 x10E3/uL (ref 150–450)
RBC: 3.68 x10E6/uL — ABNORMAL LOW (ref 3.77–5.28)
RDW: 13.4 % (ref 11.7–15.4)
WBC: 5 x10E3/uL (ref 3.4–10.8)

## 2024-02-06 LAB — COMPREHENSIVE METABOLIC PANEL WITH GFR
ALT: 17 IU/L (ref 0–32)
AST: 16 IU/L (ref 0–40)
Albumin: 3.9 g/dL (ref 3.7–4.7)
Alkaline Phosphatase: 89 IU/L (ref 48–129)
BUN/Creatinine Ratio: 19 (ref 12–28)
BUN: 39 mg/dL — ABNORMAL HIGH (ref 8–27)
Bilirubin Total: <0.2 mg/dL (ref 0.0–1.2)
CO2: 20 mmol/L (ref 20–29)
Calcium: 10 mg/dL (ref 8.7–10.3)
Chloride: 107 mmol/L — ABNORMAL HIGH (ref 96–106)
Creatinine, Ser: 2.03 mg/dL — ABNORMAL HIGH (ref 0.57–1.00)
Globulin, Total: 3 g/dL (ref 1.5–4.5)
Glucose: 126 mg/dL — ABNORMAL HIGH (ref 70–99)
Potassium: 4.6 mmol/L (ref 3.5–5.2)
Sodium: 144 mmol/L (ref 134–144)
Total Protein: 6.9 g/dL (ref 6.0–8.5)
eGFR: 24 mL/min/1.73 — ABNORMAL LOW (ref >59)

## 2024-02-06 LAB — SPECIMEN STATUS REPORT

## 2024-02-10 ENCOUNTER — Ambulatory Visit: Payer: Self-pay | Admitting: Family Medicine

## 2024-02-19 ENCOUNTER — Other Ambulatory Visit (HOSPITAL_COMMUNITY): Payer: Self-pay

## 2024-02-19 ENCOUNTER — Ambulatory Visit: Payer: Self-pay

## 2024-02-19 DIAGNOSIS — B0229 Other postherpetic nervous system involvement: Secondary | ICD-10-CM

## 2024-02-19 MED ORDER — LIDOCAINE 5 % EX OINT
1.0000 | TOPICAL_OINTMENT | CUTANEOUS | 1 refills | Status: DC | PRN
  Filled 2024-02-19: qty 35.44, 30d supply, fill #0

## 2024-02-19 NOTE — Telephone Encounter (Signed)
 Left detailed message.

## 2024-02-19 NOTE — Telephone Encounter (Signed)
 I sent in lidocaine  jelly, she can try that and see if it helps.

## 2024-02-19 NOTE — Telephone Encounter (Signed)
 FYI Only or Action Required?: Action required by provider: clinical question for provider and update on patient condition.  Patient was last seen in primary care on 02/04/2024 by [name], Fonda LABOR, MD.  Called Nurse Triage reporting Herpes Zoster.  Symptoms began ongoing problem for patient.  Interventions attempted: Prescription medications: Gabapentin .  Symptoms are: gradually worsening.  Triage Disposition: See PCP Within 2 Weeks  Patient/caregiver understands and will follow disposition?: Yes      Copied from CRM [id number]. Topic: Clinical - Red Word Triage >> Feb 19, 2024  2:46 PM Wess RAMAN wrote: Red Word that prompted transfer to Nurse Triage: Patient has shingles and the nerves in her toes, feet, legs hurt. Shocking pain coming up her legs. Gabapentin  (NEURONTIN ) 100 MG capsule not working and would like a cream that would help instead if there is one.  Pharmacy: [facility] [phone] - [place], [place] - [address] AT [facility] [address] [place] [address] Phone: [phone] Fax: [phone] Hours: Not open 24 hours      Reason for Disposition  Pain persisting > 1 month after rash disappears  Answer Assessment - Initial Assessment Questions Patient is requesting another medication or cream to try to help relieve her pain, stating the Gabapentin . Patient declined an appointment but states she will call back after checking her appointments next week if she is available. Please advise.      1. APPEARANCE of RASH: What does the rash look like?      No rash currently  2. LOCATION: Where is the rash located?      Back of shoulder  3. ONSET: When did the rash start?      It was a good while ago 4. ITCHING: Does the rash itch? If Yes, ask: How bad is the itch?  (Scale 1-10; or mild, moderate, severe)     No 5. PAIN: Does the rash hurt? If Yes, ask: How bad is the pain?  (Scale 0-10; or none, mild, moderate,  severe)     9/10 when present which is mostly at night  6. OTHER SYMPTOMS: Do you have any other symptoms? (e.g., fever)     Pain to her bilateral toes and feet  Protocols used: Shingles (Zoster)-A-AH

## 2024-02-22 ENCOUNTER — Other Ambulatory Visit (HOSPITAL_COMMUNITY): Payer: Self-pay

## 2024-02-23 ENCOUNTER — Ambulatory Visit

## 2024-03-03 ENCOUNTER — Other Ambulatory Visit (HOSPITAL_COMMUNITY): Payer: Self-pay

## 2024-03-14 DIAGNOSIS — E0590 Thyrotoxicosis, unspecified without thyrotoxic crisis or storm: Secondary | ICD-10-CM | POA: Diagnosis not present

## 2024-03-15 ENCOUNTER — Ambulatory Visit: Admitting: Nurse Practitioner

## 2024-03-15 ENCOUNTER — Encounter: Payer: Self-pay | Admitting: Nurse Practitioner

## 2024-03-15 VITALS — BP 132/78 | HR 81 | Ht 62.0 in | Wt 222.6 lb

## 2024-03-15 DIAGNOSIS — E0590 Thyrotoxicosis, unspecified without thyrotoxic crisis or storm: Secondary | ICD-10-CM

## 2024-03-15 LAB — T4, FREE: Free T4: 0.87 ng/dL (ref 0.82–1.77)

## 2024-03-15 LAB — TSH: TSH: 0.322 u[IU]/mL — ABNORMAL LOW (ref 0.450–4.500)

## 2024-03-15 LAB — T3, FREE: T3, Free: 3 pg/mL (ref 2.0–4.4)
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
## 2024-03-22 ENCOUNTER — Ambulatory Visit: Admitting: Gastroenterology

## 2024-04-01 ENCOUNTER — Ambulatory Visit: Payer: Self-pay

## 2024-04-13 ENCOUNTER — Ambulatory Visit: Payer: Self-pay

## 2024-04-13 ENCOUNTER — Ambulatory Visit

## 2024-04-13 NOTE — Telephone Encounter (Signed)
 Pt has appt
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
## 2024-04-22 ENCOUNTER — Other Ambulatory Visit (HOSPITAL_COMMUNITY): Payer: Self-pay

## 2024-04-22 ENCOUNTER — Encounter: Payer: Self-pay | Admitting: Family Medicine

## 2024-04-22 ENCOUNTER — Ambulatory Visit: Admitting: Family Medicine

## 2024-04-22 VITALS — BP 170/74 | HR 89 | Ht 62.0 in | Wt 221.0 lb

## 2024-04-22 DIAGNOSIS — B0229 Other postherpetic nervous system involvement: Secondary | ICD-10-CM

## 2024-04-22 DIAGNOSIS — M170 Bilateral primary osteoarthritis of knee: Secondary | ICD-10-CM

## 2024-04-22 MED ORDER — AMITRIPTYLINE HCL 25 MG PO TABS
25.0000 mg | ORAL_TABLET | Freq: Every day | ORAL | 0 refills | Status: DC
  Filled 2024-04-22: qty 30, 30d supply, fill #0

## 2024-04-22 MED ORDER — PREDNISONE 20 MG PO TABS
40.0000 mg | ORAL_TABLET | Freq: Every day | ORAL | 0 refills | Status: DC
  Filled 2024-04-22: qty 10, 5d supply, fill #0

## 2024-04-22 NOTE — Progress Notes (Signed)
 BP (!) 170/74   Pulse 89   Ht 5' 2 (1.575 m)   Wt 221 lb (100.2 kg)   SpO2 99%   BMI 40.42 kg/m    Subjective:   Patient ID: Shelly [name], female    DOB: 02-08-1941, 83 y.o.   MRN: [id number]  HPI: Shelly [name] is a 83 y.o. female presenting on 04/22/2024 for Knee Pain (Left>right)   Discussed the use of AI scribe software for clinical note transcription with the patient, who gave verbal consent to proceed.  History of Present Illness    Patient is coming today complaining of bilateral knee pain, she states worse on the left than the right.  She has had x-rays in her history that showed advanced tricompartmental disease in both knees, especially in the left 1.  She has been bothering her more over the past couple months.  She denies any fevers or chills or redness or warmth.  It does hurt more when she is walking or standing.  She denies any catching or falling or giving way.  She has been trying to use some topical agents for it and they did not seem to be helping.  She cannot take oral anti-inflammatories because of history of GI bleed.         Relevant past medical, surgical, family and social history reviewed and updated as indicated. Interim medical history since our last visit reviewed. Allergies and medications reviewed and updated.  Review of Systems  Constitutional:  Negative for chills and fever.  Eyes:  Negative for redness and visual disturbance.  Respiratory:  Negative for chest tightness and shortness of breath.   Cardiovascular:  Negative for chest pain and leg swelling.  Musculoskeletal:  Positive for arthralgias and joint swelling. Negative for back pain, gait problem, myalgias and neck pain.  Skin:  Negative for rash.  Neurological:  Negative for dizziness, light-headedness and headaches.  Psychiatric/Behavioral:  Negative for agitation and behavioral problems.   All other systems reviewed and are negative.   Per HPI unless specifically indicated  above   Allergies as of 04/22/2024       Reactions   Nsaids Other (See Comments)   AVOID all NSAID due to history of GI bleeding   Atorvastatin  Other (See Comments)   Joint & muscle pain        Medication List        Accurate as of April 22, 2024  3:35 PM. If you have any questions, ask your nurse or doctor.          acetaminophen  500 MG tablet Commonly known as: TYLENOL  Take 2 tablets (1,000 mg total) by mouth every 6 (six) hours.   amitriptyline  25 MG tablet Commonly known as: ELAVIL  Take 1 tablet (25 mg total) by mouth at bedtime.   baclofen  10 MG tablet Commonly known as: LIORESAL  Take 10 mg by mouth in the morning.   chlorthalidone  25 MG tablet Commonly known as: HYGROTON  TAKE 1 TABLET(25 MG) BY MOUTH DAILY   clopidogrel  75 MG tablet Commonly known as: PLAVIX  Take 1 tablet (75 mg total) by mouth daily.   ezetimibe  10 MG tablet Commonly known as: ZETIA  Take 1 tablet (10 mg total) by mouth daily.   gabapentin  100 MG capsule Commonly known as: NEURONTIN  TAKE 1 CAPSULE(100 MG) BY MOUTH FOUR TIMES DAILY   hydrALAZINE  25 MG tablet Commonly known as: APRESOLINE  Take 1 tablet (25 mg total) by mouth 2 (two) times daily.   hydrocortisone  2.5 %  rectal cream Commonly known as: ANUSOL -HC Place 1 Application rectally 2 (two) times daily. As needed for rectal discomfort.   lidocaine  5 % ointment Commonly known as: XYLOCAINE  Apply topically as needed.   metoprolol  succinate 50 MG 24 hr tablet Commonly known as: TOPROL -XL Take 1 tablet (50 mg total) by mouth daily. Take with or immediately following a meal.   montelukast  10 MG tablet Commonly known as: SINGULAIR  Take 1 tablet (10 mg total) by mouth at bedtime.   multivitamin tablet Take 1 tablet by mouth in the morning.   pantoprazole  40 MG tablet Commonly known as: PROTONIX  Take 1 tablet (40 mg total) by mouth daily before breakfast.   predniSONE  20 MG tablet Commonly known as: DELTASONE  Take 2  tablets (40 mg total) by mouth daily. Started by: Fonda LABOR Dejon Lukas   rosuvastatin  40 MG tablet Commonly known as: CRESTOR  Take 1 tablet (40 mg total) by mouth daily.         Objective:   BP (!) 170/74   Pulse 89   Ht 5' 2 (1.575 m)   Wt 221 lb (100.2 kg)   SpO2 99%   BMI 40.42 kg/m   Wt Readings from Last 3 Encounters:  04/22/24 221 lb (100.2 kg)  03/15/24 222 lb 9.6 oz (101 kg)  02/04/24 220 lb (99.8 kg)    Physical Exam Vitals and nursing note reviewed.  Constitutional:      Appearance: Normal appearance. She is obese.  Musculoskeletal:     Right knee: Effusion and crepitus present. No tenderness. No MCL, LCL, ACL or PCL tenderness. Normal alignment.     Left knee: Effusion and crepitus present. Tenderness present over the lateral joint line. No MCL, LCL, ACL or PCL tenderness. Normal alignment.  Neurological:     Mental Status: She is alert.                 Assessment & Plan:   Problem List Items Addressed This Visit       Nervous and Auditory   Postherpetic neuralgia   Relevant Medications   amitriptyline  (ELAVIL ) 25 MG tablet     Musculoskeletal and Integument   Osteoarthritis left knee - Primary   Relevant Medications   predniSONE  (DELTASONE ) 20 MG tablet            Discussed options of doing a short course of oral prednisone  to see if we can calm it down versus doing an intra-articular injection versus going to rehab and she has opted for doing the oral medicine first just injections later.     Follow up plan: Return if symptoms worsen or fail to improve.  Counseling provided for all of the vaccine components No orders of the defined types were placed in this encounter.   Fonda Levins, MD [facility] 04/22/2024, 3:35 PM

## 2024-04-25 ENCOUNTER — Telehealth: Payer: Self-pay | Admitting: Neurology

## 2024-04-25 ENCOUNTER — Institutional Professional Consult (permissible substitution): Admitting: Neurology

## 2024-04-25 ENCOUNTER — Telehealth: Payer: Self-pay | Admitting: Family Medicine

## 2024-04-25 ENCOUNTER — Other Ambulatory Visit: Payer: Self-pay | Admitting: Gastroenterology

## 2024-04-25 DIAGNOSIS — K219 Gastro-esophageal reflux disease without esophagitis: Secondary | ICD-10-CM

## 2024-04-25 MED ORDER — PREDNISONE 20 MG PO TABS: 40.0000 mg | ORAL_TABLET | Freq: Every day | ORAL | 0 refills | Status: DC

## 2024-04-25 NOTE — Telephone Encounter (Signed)
 Appointment r/s due to weather, pt on wait list, pt stated she is needing to be seen as soon as possible as she is having a thumping in her head daily.  Phone rep put appointment on wait list and informed pt she would forward this to RN

## 2024-04-25 NOTE — Telephone Encounter (Signed)
 Resent to [employer]. Pt made aware.

## 2024-04-25 NOTE — Telephone Encounter (Signed)
 Lvm by hf offering Wednesday as a virtual visit 04/27/24 3pm

## 2024-04-25 NOTE — Telephone Encounter (Signed)
 Copied from CRM [id number]. Topic: Clinical - Prescription Issue >> Apr 25, 2024 12:04 PM Leonette SQUIBB wrote: Reason for CRM: patient called saying the prednisone  that was prescribed Friday needs to go to [facility] and not to [facility].

## 2024-04-26 ENCOUNTER — Other Ambulatory Visit (HOSPITAL_COMMUNITY): Payer: Self-pay

## 2024-04-26 NOTE — Telephone Encounter (Signed)
 Called and lvm 2nd attempt by hf 04/26/24

## 2024-04-27 NOTE — Telephone Encounter (Signed)
 LVM patient to call back to confirm appointment at 2:30 pm

## 2024-05-05 ENCOUNTER — Ambulatory Visit: Payer: Self-pay | Admitting: Family Medicine

## 2024-05-16 ENCOUNTER — Telehealth: Payer: Self-pay | Admitting: Family Medicine

## 2024-05-16 NOTE — Telephone Encounter (Signed)
 Copied from CRM #[id number]. Topic: Clinical - Prescription Issue >> May 13, 2024  3:34 PM Joesph B wrote: Reason for CRM: patient is calling in about a medication she uses for dry skin/itching. She misplaced the medication and she's asking for a refill. She does not know the name. She said it is a tablet she takes. Can someone assist with this?

## 2024-05-16 NOTE — Telephone Encounter (Signed)
 When pt was in the office last I believe she had a Rx bottle of hydroxyzine. She was not sure what she had it for. I thought at the time  25mg  daily prn- hydroxyzine  Or 10mg  Zyrtec  daily   Spoke with pt. She would rather take Zyrtec  and will purchase it OTC.

## 2024-05-26 ENCOUNTER — Other Ambulatory Visit: Payer: Self-pay | Admitting: Cardiology

## 2024-05-26 LAB — TSH: TSH: 0.351 u[IU]/mL — ABNORMAL LOW (ref 0.450–4.500)

## 2024-05-26 LAB — T3, FREE: T3, Free: 3 pg/mL (ref 2.0–4.4)

## 2024-05-26 LAB — T4, FREE: Free T4: 0.85 ng/dL (ref 0.82–1.77)

## 2024-05-31 ENCOUNTER — Encounter: Payer: Self-pay | Admitting: Gastroenterology

## 2024-05-31 ENCOUNTER — Ambulatory Visit: Admitting: Gastroenterology

## 2024-05-31 VITALS — BP 138/75 | HR 87 | Temp 98.2°F | Ht 62.0 in | Wt 223.3 lb

## 2024-05-31 DIAGNOSIS — K5900 Constipation, unspecified: Secondary | ICD-10-CM

## 2024-05-31 DIAGNOSIS — K219 Gastro-esophageal reflux disease without esophagitis: Secondary | ICD-10-CM | POA: Diagnosis not present

## 2024-05-31 DIAGNOSIS — Z862 Personal history of diseases of the blood and blood-forming organs and certain disorders involving the immune mechanism: Secondary | ICD-10-CM | POA: Diagnosis not present

## 2024-05-31 DIAGNOSIS — Z7902 Long term (current) use of antithrombotics/antiplatelets: Secondary | ICD-10-CM

## 2024-05-31 DIAGNOSIS — D649 Anemia, unspecified: Secondary | ICD-10-CM

## 2024-05-31 MED ORDER — NYSTATIN 100000 UNIT/GM EX OINT: 1.0000 | TOPICAL_OINTMENT | Freq: Two times a day (BID) | CUTANEOUS | 1 refills | Status: AC

## 2024-05-31 NOTE — Patient Instructions (Signed)
 I am so glad you are doing well!  You can use the nystatin  ointment in between the groin area. If this worsens, please see primary care.   Happy early birthday! Have a wonderful time!  I will see you in 3 months!  I enjoyed seeing you again today! I value our relationship and want to provide genuine, compassionate, and quality care. You may receive a survey regarding your visit with me, and I welcome your feedback! Thanks so much for taking the time to complete this. I look forward to seeing you again.      Therisa MICAEL Stager, PhD, ANP-BC [facility]

## 2024-05-31 NOTE — Progress Notes (Signed)
 "   Gastroenterology Office Note     Primary Care Physician:  [name], Fonda LABOR, MD  Primary Gastroenterologist: Dr Shaaron    Chief Complaint   Chief Complaint  Patient presents with   Follow-up    Pt arrives for follow up. GERD is controlled at this time. Would like refill on Anusol  cream.      History of Present Illness   Shelly [name] is an 84 y.o. female presenting today with a history of  chronic GERD, diverticular bleed many years prior, IDA with capsule study showing enteritis due to aspirin  use, and chronic normocytic anemia with chronic disease component.  She prefers frequent visits, although she is doing well. She has requested visits every 3 months.    Sept 2025: Ferritin most recently 50, iron 58, sats 21, Hgb 11.1 in Sept 2025. Previously 9/10 range a few years ago. Hgb 11 on 1/7, Creatinine 1.58. not interested in any endoscopic evaluations. Prefers to hold off on any blood work today.   GERD controlled on sparingly using PPI.  States BM is soft but only twice a week. No straining. No supplemental fiber. Metamucil worked well for her in the past. Would have good bowel movements if taking in the morning.   Feels raw in the seam of her legs, groin area. No redness. Declining exam. States worse when sweating and moisture.   Right back with post herpetic neuralgia.     Past Medical History:  Diagnosis Date   Allergy    Anemia    GI bleed   Anginal pain    Anxiety    Arthritis    Blood transfusion without reported diagnosis    CAD (coronary artery disease)    a. s/p NSTEMI in 2015 with DES x2 to RCA   Cataract    Chronic kidney disease    Coronary artery disease    SEVERE   GERD (gastroesophageal reflux disease)    GI bleed 12/2013   Goiter    Hyperlipidemia    Hypertension    Myocardial infarct [facility])    Myocardial infarction (HCC) 08/2013   Neuralgia of right lower extremity 07/13/2017   Pancolonic diverticulosis 12/2013   Post herpetic  neuralgia     Past Surgical History:  Procedure Laterality Date   CARPAL TUNNEL RELEASE Bilateral    COLONOSCOPY N/A 01/13/2014   Dr. Hung:pandiverticulosis    COLONOSCOPY N/A 12/14/2015   [name]: pancolonic diverticulosis, internal hemorrhoids    CORNEAL TRANSPLANT     CORONARY ANGIOPLASTY  08/2013   ESOPHAGOGASTRODUODENOSCOPY N/A 12/14/2015   [name]: normal    GIVENS CAPSULE STUDY N/A 08/13/2016   [name]: enteritis due to aspirin .    HEEL SPUR EXCISION     LEFT HEART CATHETERIZATION WITH CORONARY ANGIOGRAM N/A 08/23/2013   Procedure: LEFT HEART CATHETERIZATION WITH CORONARY ANGIOGRAM;  Surgeon: Peter M Jordan, MD;  Location: [facility] CATH LAB;  Service: Cardiovascular;  Laterality: N/A;   PERCUTANEOUS CORONARY STENT INTERVENTION (PCI-S) N/A 08/24/2013   Procedure: PERCUTANEOUS CORONARY STENT INTERVENTION (PCI-S);  Surgeon: Peter M Jordan, MD;  Location: [facility] CATH LAB;  Service: Cardiovascular;  Laterality: N/A;   TONSILLECTOMY     TUBAL LIGATION      Current Outpatient Medications  Medication Sig Dispense Refill   acetaminophen  (TYLENOL ) 500 MG tablet Take 2 tablets (1,000 mg total) by mouth every 6 (six) hours. 30 tablet 0   amitriptyline  (ELAVIL ) 25 MG tablet Take 1 tablet (25 mg total) by mouth at bedtime. 90  tablet 0   baclofen  (LIORESAL ) 10 MG tablet Take 10 mg by mouth in the morning.     chlorthalidone  (HYGROTON ) 25 MG tablet TAKE 1 TABLET(25 MG) BY MOUTH DAILY 90 tablet 0   clopidogrel  (PLAVIX ) 75 MG tablet Take 1 tablet (75 mg total) by mouth daily. 90 tablet 3   ezetimibe  (ZETIA ) 10 MG tablet TAKE 1 TABLET(10 MG) BY MOUTH DAILY 90 tablet 1   gabapentin  (NEURONTIN ) 100 MG capsule TAKE 1 CAPSULE(100 MG) BY MOUTH FOUR TIMES DAILY 360 capsule 0   hydrALAZINE  (APRESOLINE ) 25 MG tablet Take 1 tablet (25 mg total) by mouth 2 (two) times daily. 60 tablet 3   hydrocortisone  (ANUSOL -HC) 2.5 % rectal cream Place 1 Application rectally 2 (two) times daily. As needed for rectal  discomfort. 30 g 1   lidocaine  (XYLOCAINE ) 5 % ointment Apply topically as needed. 35.44 g 1   metoprolol  succinate (TOPROL -XL) 50 MG 24 hr tablet Take 1 tablet (50 mg total) by mouth daily. Take with or immediately following a meal. 30 tablet 3   montelukast  (SINGULAIR ) 10 MG tablet Take 1 tablet (10 mg total) by mouth at bedtime. 90 tablet 3   Multiple Vitamin (MULTIVITAMIN) tablet Take 1 tablet by mouth in the morning.     pantoprazole  (PROTONIX ) 40 MG tablet TAKE 1 TABLET BY MOUTH EVERY MORNING BEFORE BREAKFAST 90 tablet 3   rosuvastatin  (CRESTOR ) 40 MG tablet Take 1 tablet (40 mg total) by mouth daily. 90 tablet 2   predniSONE  (DELTASONE ) 20 MG tablet Take 2 tablets (40 mg total) by mouth daily. (Patient not taking: Reported on 05/31/2024) 10 tablet 0   No current facility-administered medications for this visit.    Allergies as of 05/31/2024 - Review Complete 05/31/2024  Allergen Reaction Noted   Nsaids Other (See Comments) 07/25/2016   Atorvastatin  Other (See Comments) 11/30/2018    Family History  Problem Relation Age of Onset   Cancer Mother        unsure of type    Other Father        grangrene    Asthma Sister    Stroke Sister    COPD Sister    Stroke Sister    Hypertension Brother    Transient ischemic attack Brother    Early death Brother    Early death Brother    Liver cancer Daughter    Cancer Daughter    Pancreatic disease Daughter        pancreatectomy   Cancer Daughter    Diverticulitis Daughter    Arthritis Daughter        knee    Arthritis Daughter        shoulder    Anemia Daughter    Hernia Son        Umbilical   GI problems Son    Liver cancer Son    GER disease Son    Colon cancer Neg Hx    Colon polyps Neg Hx     Social History   Socioeconomic History   Marital status: Divorced    Spouse name: Not on file   Number of children: 8   Years of education: 11   Highest education level: 11th grade  Occupational History   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   No daily caffeine use.   2 story home but she only has to use one level   Social Drivers of Health   Tobacco Use: Low Risk (05/31/2024)   Patient History    Smoking Tobacco Use: Never    Smokeless Tobacco Use: Never    Passive Exposure: Not on file  Financial Resource Strain: Low Risk (08/08/2021)   Overall Financial Resource Strain (CARDIA)    Difficulty of Paying Living Expenses: Not hard at all  Food Insecurity: No Food Insecurity (09/29/2023)   Hunger Vital Sign    Worried About Running Out of Food in the Last Year: Never true    Ran Out of Food in the Last Year: Never true  Transportation Needs: No Transportation Needs (09/29/2023)   PRAPARE - Administrator, Civil Service (Medical): No    Lack of Transportation (Non-Medical): No  Physical Activity: Inactive (12/17/2021)   Exercise Vital Sign    Days of Exercise per Week: 0 days    Minutes of Exercise per Session: 0 min  Stress: Stress Concern Present (12/17/2021)   [employer] of Occupational Health - Occupational Stress Questionnaire    Feeling of Stress : To some extent  Social Connections: Socially Isolated (09/29/2023)   Social Connection and Isolation Panel    Frequency of Communication with Friends and Family: More than three times a week    Frequency of Social Gatherings with Friends and Family: More than three times a week    Attends Religious Services: Never     Database Administrator or Organizations: No    Attends Banker Meetings: Never    Marital Status: Widowed  Intimate Partner Violence: Not At Risk (09/29/2023)   Humiliation, Afraid, Rape, and Kick questionnaire    Fear of Current or Ex-Partner: No    Emotionally Abused: No    Physically Abused: No    Sexually Abused: No  Depression (PHQ2-9): Low Risk (04/22/2024)   Depression (PHQ2-9)    PHQ-2 Score: 0  Alcohol Screen: Low Risk (08/08/2021)   Alcohol Screen    Last Alcohol Screening Score (AUDIT): 0  Housing: Low Risk (09/29/2023)   Housing Stability Vital Sign    Unable to Pay for Housing in the Last Year: No    Number of Times Moved in the Last Year: 0    Homeless in the Last Year: No  Utilities: Not At Risk (09/29/2023)   AHC Utilities    Threatened with loss of utilities: No  Health Literacy: Not on file     Review of Systems   Gen: Denies any fever, chills, fatigue, weight loss, lack of appetite.  CV: Denies chest pain, heart palpitations, peripheral edema, syncope.  Resp: Denies shortness of breath at rest or with exertion. Denies wheezing or cough.  GI: Denies dysphagia or odynophagia. Denies jaundice, hematemesis, fecal incontinence. GU : Denies urinary burning, urinary frequency, urinary hesitancy MS: Denies joint pain, muscle weakness, cramps, or limitation of movement.  Derm: Denies rash, itching, dry skin Psych: Denies depression, anxiety, memory loss, and confusion Heme: Denies bruising, bleeding, and enlarged lymph nodes.  Physical Exam   BP 138/75   Pulse 87   Temp 98.2 F (36.8 C)   Ht 5' 2 (1.575 m)   Wt 223 lb 4.8 oz (101.3 kg)   BMI 40.84 kg/m  General:   Alert and oriented. Pleasant and cooperative. Well-nourished and well-developed.  Head:  Normocephalic and atraumatic. Eyes:  Without icterus Abdomen:  +BS, soft, non-tender and non-distended. No HSM noted. No guarding or rebound. No masses appreciated.  Rectal:  Deferred   Neurologic:  Alert and  oriented x4;  grossly normal neurologically. Skin:  Intact without significant lesions or rashes. Psych:  Alert and cooperative. Normal mood and affect.   Assessment/Plan:    Shelly [name] is a 84 y.o. female presenting today with a history of chronic GERD, diverticular bleed many years prior, IDA with capsule study showing enteritis due to aspirin  use, and chronic normocytic anemia with chronic disease component as well.  She prefers frequent visits, although she is doing well. She has requested visits every 3 months.   Hx of IDA: ferritin drifted to 50 from prior labs that were historically in the 90s/low 100s. Hgb stable in 11 range. Known chronic disease component. She is declining any updated labs or endoscopic evaluations. As on Plavix , could have occult blood loss ongoing and unable to exclude. She prefers conservative measures  Constipation: improved with supplemental fiber historically and will resume this.   GERD: very rarely needs PPI.   Reports groin rash, suspect this is candida-related. She declined examination but requested topical therapy. I have given short course of nystatin  ointment and she will seek care if any changes or worsening.   Desires to be seen every 3 months. Will see her in 3 months or sooner if needed.   Therisa MICAEL Stager, PhD, ANP-BC [facility]    "

## 2024-06-01 ENCOUNTER — Telehealth: Payer: Self-pay

## 2024-06-01 NOTE — Telephone Encounter (Signed)
 Pt phoned stating that you were going to send 2 Rx's to her pharmacy. She went there and nothing was there for her. The pt needs for you to send her Rx's to [facility] in [place], not [place]. I seen Rx at [facility] for the nystatin  oinment. Please advise

## 2024-06-02 ENCOUNTER — Ambulatory Visit: Admitting: Family Medicine

## 2024-06-02 ENCOUNTER — Emergency Department (HOSPITAL_COMMUNITY)

## 2024-06-02 ENCOUNTER — Encounter (HOSPITAL_COMMUNITY): Payer: Self-pay

## 2024-06-02 ENCOUNTER — Other Ambulatory Visit: Payer: Self-pay

## 2024-06-02 ENCOUNTER — Emergency Department (HOSPITAL_COMMUNITY)
Admission: EM | Admit: 2024-06-02 | Discharge: 2024-06-02 | Disposition: A | Discharge status: Home / Self Care | Attending: Emergency Medicine | Admitting: Emergency Medicine

## 2024-06-02 DIAGNOSIS — M25562 Pain in left knee: Secondary | ICD-10-CM | POA: Diagnosis present

## 2024-06-02 DIAGNOSIS — Z79899 Other long term (current) drug therapy: Secondary | ICD-10-CM | POA: Diagnosis not present

## 2024-06-02 DIAGNOSIS — I129 Hypertensive chronic kidney disease with stage 1 through stage 4 chronic kidney disease, or unspecified chronic kidney disease: Secondary | ICD-10-CM | POA: Insufficient documentation

## 2024-06-02 DIAGNOSIS — N184 Chronic kidney disease, stage 4 (severe): Secondary | ICD-10-CM | POA: Diagnosis not present

## 2024-06-02 DIAGNOSIS — E119 Type 2 diabetes mellitus without complications: Secondary | ICD-10-CM | POA: Insufficient documentation

## 2024-06-02 MED ORDER — HYDROCODONE-ACETAMINOPHEN 5-325 MG PO TABS: 1.0000 | ORAL_TABLET | Freq: Four times a day (QID) | ORAL | 0 refills | Status: AC | PRN

## 2024-06-02 MED ORDER — LIDOCAINE 5 % EX PTCH: 1.0000 | MEDICATED_PATCH | CUTANEOUS | 0 refills | Status: AC

## 2024-06-02 MED ORDER — HYDROCODONE-ACETAMINOPHEN 5-325 MG PO TABS
1.0000 | ORAL_TABLET | Freq: Once | ORAL | Status: AC
  Administered 2024-06-02: 1 via ORAL
  Filled 2024-06-02: qty 1

## 2024-06-02 MED ORDER — LIDOCAINE 5 % EX PTCH
1.0000 | MEDICATED_PATCH | CUTANEOUS | Status: DC
  Administered 2024-06-02: 1 via TRANSDERMAL
  Filled 2024-06-02: qty 1

## 2024-06-02 NOTE — Discharge Instructions (Addendum)
 You were seen in the emergency room today for left knee pain has been ongoing.  Your x-ray shows arthritis but no broken bones.  You can use the lidocaine  patches, when pain is more severe you can use the Norco.  This is an opiate pain medicine, this can cause constipation so make sure you take a stool softener if you are taking this.  Do not drive or operate machinery when taking this.  It can also cause you to be sleepy or dizzy, do not take it when you take the muscle relaxers or take it with the amitriptyline  you take at bedtime.  Please follow-up close with your PCP and orthopedics.  Come back to the ER for new or worsening symptoms.
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
## 2024-06-06 NOTE — Telephone Encounter (Signed)
 There were 2 messages on the phone regarding these Rx's for the pt and she doesn't remember what they both were. Please send to [facility] in [place].

## 2024-06-07 ENCOUNTER — Ambulatory Visit

## 2024-06-08 MED ORDER — HYDROCORTISONE (PERIANAL) 2.5 % EX CREA: 1.0000 | TOPICAL_CREAM | Freq: Two times a day (BID) | CUTANEOUS | 1 refills | Status: AC

## 2024-06-08 MED ORDER — NYSTATIN 100000 UNIT/GM EX CREA: 1.0000 | TOPICAL_CREAM | Freq: Two times a day (BID) | CUTANEOUS | 1 refills | Status: AC

## 2024-06-08 NOTE — Telephone Encounter (Signed)
 Phoned and advised the pt of both Rx's being sent to [facility] in [place]. Pt expressed understanding and that was all she was needing

## 2024-06-08 NOTE — Telephone Encounter (Signed)
 I sent in the nystatin  cream to her pharmacy on 1/13. Sent to [facility] pharmacy initially. I sent to [facility] today. I sent in anusol  rectum cream as well. I am unsure any other medication she may be needing.

## 2024-06-08 NOTE — Addendum Note (Signed)
 Addended by: SHIRLEAN THERISA ORN on: 06/08/2024 03:22 PM   Modules accepted: Orders

## 2024-06-14 ENCOUNTER — Ambulatory Visit: Payer: Self-pay

## 2024-06-14 VITALS — BP 138/75 | HR 87 | Ht 62.0 in | Wt 223.0 lb

## 2024-06-14 DIAGNOSIS — Z0000 Encounter for general adult medical examination without abnormal findings: Secondary | ICD-10-CM

## 2024-06-14 DIAGNOSIS — B0229 Other postherpetic nervous system involvement: Secondary | ICD-10-CM | POA: Diagnosis not present

## 2024-06-14 DIAGNOSIS — Z0001 Encounter for general adult medical examination with abnormal findings: Secondary | ICD-10-CM

## 2024-06-14 DIAGNOSIS — Z13820 Encounter for screening for osteoporosis: Secondary | ICD-10-CM | POA: Diagnosis not present

## 2024-06-14 MED ORDER — AMITRIPTYLINE HCL 25 MG PO TABS: 25.0000 mg | ORAL_TABLET | Freq: Every day | ORAL | 0 refills | Status: AC

## 2024-06-14 NOTE — Patient Instructions (Signed)
 Shelly [name],  Thank you for taking the time for your Medicare Wellness Visit. I appreciate your continued commitment to your health goals. Please review the care plan we discussed, and feel free to reach out if I can assist you further.  Please note that Annual Wellness Visits do not include a physical exam. Some assessments may be limited, especially if the visit was conducted virtually. If needed, we may recommend an in-person follow-up with your provider.  Ongoing Care Seeing your primary care provider every 3 to 6 months helps us  monitor your health and provide consistent, personalized care.   Referrals If a referral was made during today's visit and you haven't received any updates within two weeks, please contact the referred provider directly to check on the status.  Recommended Screenings:  Health Maintenance  Topic Date Due   COVID-19 Vaccine (1) Never done   Complete foot exam   Never done   Eye exam for diabetics  Never done   Zoster (Shingles) Vaccine (2 of 2) 05/12/2018   Osteoporosis screening with Bone Density Scan  08/01/2020   Medicare Annual Wellness Visit  08/27/2023   Kidney health urinalysis for diabetes  01/15/2024   Flu Shot  08/16/2024*   Hemoglobin A1C  08/03/2024   Yearly kidney function blood test for diabetes  02/03/2025   DTaP/Tdap/Td vaccine (2 - Td or Tdap) 07/14/2032   Pneumococcal Vaccine for age over 68  Completed   Meningitis B Vaccine  Aged Out  *Topic was postponed. The date shown is not the original due date.       06/14/2024   11:58 AM  Advanced Directives  Does Patient Have a Medical Advance Directive? Yes  Type of Estate Agent of [place];Living will  Copy of Healthcare Power of Attorney in Chart? Yes - validated most recent copy scanned in chart (See row information)    Vision: Annual vision screenings are recommended for early detection of glaucoma, cataracts, and diabetic retinopathy. These exams can also  reveal signs of chronic conditions such as diabetes and high blood pressure.  Dental: Annual dental screenings help detect early signs of oral cancer, gum disease, and other conditions linked to overall health, including heart disease and diabetes.  Please see the attached documents for additional preventive care recommendations.
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
## 2024-06-20 ENCOUNTER — Ambulatory Visit: Admitting: Cardiology

## 2024-06-21 ENCOUNTER — Ambulatory Visit: Admitting: Nurse Practitioner

## 2024-06-21 DIAGNOSIS — E0590 Thyrotoxicosis, unspecified without thyrotoxic crisis or storm: Secondary | ICD-10-CM

## 2024-06-23 ENCOUNTER — Ambulatory Visit: Admitting: Nurse Practitioner

## 2024-06-23 ENCOUNTER — Encounter: Payer: Self-pay | Admitting: Nurse Practitioner

## 2024-06-23 VITALS — BP 116/66 | HR 99 | Ht 62.0 in | Wt 225.2 lb

## 2024-06-23 DIAGNOSIS — E0590 Thyrotoxicosis, unspecified without thyrotoxic crisis or storm: Secondary | ICD-10-CM | POA: Diagnosis not present

## 2024-06-23 NOTE — Progress Notes (Signed)
 "                                                  06/23/2024, 2:53 PM                                     Endocrinology follow-up note    Subjective:    Patient ID: Shelly [name], female    DOB: October 17, 1940, PCP [name], Fonda DELENA, MD   Past Medical History:  Diagnosis Date   Allergy    Anemia    GI bleed   Anginal pain    Anxiety    Arthritis    Blood transfusion without reported diagnosis    CAD (coronary artery disease)    a. s/p NSTEMI in 2015 with DES x2 to RCA   Cataract    Chronic kidney disease    Coronary artery disease    SEVERE   GERD (gastroesophageal reflux disease)    GI bleed 12/2013   Goiter    Hyperlipidemia    Hypertension    Myocardial infarct [facility])    Myocardial infarction (HCC) 08/2013   Neuralgia of right lower extremity 07/13/2017   Pancolonic diverticulosis 12/2013   Post herpetic neuralgia    Past Surgical History:  Procedure Laterality Date   CARPAL TUNNEL RELEASE Bilateral    COLONOSCOPY N/A 01/13/2014   Dr. Hung:pandiverticulosis    COLONOSCOPY N/A 12/14/2015   [name]: pancolonic diverticulosis, internal hemorrhoids    CORNEAL TRANSPLANT     CORONARY ANGIOPLASTY  08/2013   ESOPHAGOGASTRODUODENOSCOPY N/A 12/14/2015   [name]: normal    GIVENS CAPSULE STUDY N/A 08/13/2016   [name]: enteritis due to aspirin .    HEEL SPUR EXCISION     LEFT HEART CATHETERIZATION WITH CORONARY ANGIOGRAM N/A 08/23/2013   Procedure: LEFT HEART CATHETERIZATION WITH CORONARY ANGIOGRAM;  Surgeon: Peter M Jordan, MD;  Location: [facility] CATH LAB;  Service: Cardiovascular;  Laterality: N/A;   PERCUTANEOUS CORONARY STENT INTERVENTION (PCI-S) N/A 08/24/2013   Procedure: PERCUTANEOUS CORONARY STENT INTERVENTION (PCI-S);  Surgeon: Peter M Jordan, MD;  Location: [facility] CATH LAB;  Service: Cardiovascular;  Laterality: N/A;   TONSILLECTOMY     TUBAL LIGATION     Social History   Socioeconomic History   Marital status: Divorced    Spouse name: Not on file    Number of children: 8   Years of education: 11   Highest education level: 11th grade  Occupational History   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   No daily caffeine use.   2 story home but she only has to use one level   Social Drivers of Health   Tobacco Use:  Low Risk (06/23/2024)   Patient History    Smoking Tobacco Use: Never    Smokeless Tobacco Use: Never    Passive Exposure: Not on file  Financial Resource Strain: Low Risk (08/08/2021)   Overall Financial Resource Strain (CARDIA)    Difficulty of Paying Living Expenses: Not hard at all  Food Insecurity: Food Insecurity Present (06/14/2024)   Epic    Worried About Programme Researcher, Broadcasting/film/video in the Last Year: Sometimes true    Ran Out of Food in the Last Year: Sometimes true  Transportation Needs: No Transportation Needs (06/14/2024)   Epic    Lack of Transportation (Medical): No    Lack of Transportation (Non-Medical): No  Physical Activity: Insufficiently Active (06/14/2024)   Exercise Vital Sign    Days of Exercise per Week: 3 days    Minutes of Exercise per Session: 20 min  Stress: No Stress Concern Present (06/14/2024)   [employer] of Occupational Health - Occupational Stress Questionnaire    Feeling of Stress: Only a little  Social Connections: Socially Isolated (06/14/2024)   Social Connection and Isolation Panel    Frequency of Communication with Friends and Family: More than three times a week     Frequency of Social Gatherings with Friends and Family: More than three times a week    Attends Religious Services: Never    Database Administrator or Organizations: No    Attends Banker Meetings: Never    Marital Status: Widowed  Depression (PHQ2-9): Low Risk (06/14/2024)   Depression (PHQ2-9)    PHQ-2 Score: 0  Alcohol Screen: Low Risk (08/08/2021)   Alcohol Screen    Last Alcohol Screening Score (AUDIT): 0  Housing: Low Risk (06/14/2024)   Epic    Unable to Pay for Housing in the Last Year: No    Number of Times Moved in the Last Year: 0    Homeless in the Last Year: No  Utilities: Not At Risk (06/14/2024)   Epic    Threatened with loss of utilities: No  Health Literacy: Inadequate Health Literacy (06/14/2024)   B1300 Health Literacy    Frequency of need for help with medical instructions: Sometimes   Family History  Problem Relation Age of Onset   Cancer Mother        unsure of type    Other Father        grangrene    Asthma Sister    Stroke Sister    COPD Sister    Stroke Sister    Hypertension Brother    Transient ischemic attack Brother    Early death Brother    Early death Brother    Liver cancer Daughter    Cancer Daughter    Pancreatic disease Daughter        pancreatectomy   Cancer Daughter    Diverticulitis Daughter    Arthritis Daughter        knee    Arthritis Daughter        shoulder    Anemia Daughter    Hernia Son        Umbilical   GI problems Son    Liver cancer Son    GER disease Son    Colon cancer Neg Hx    Colon polyps Neg Hx    Outpatient Encounter Medications as of 06/23/2024  Medication Sig   acetaminophen  (TYLENOL ) 500 MG tablet Take 2 tablets (1,000 mg total) by mouth every 6 (six) hours.   amitriptyline  (ELAVIL )  25 MG tablet Take 1 tablet (25 mg total) by mouth at bedtime.   baclofen  (LIORESAL ) 10 MG tablet Take 10 mg by mouth in the morning.   chlorthalidone  (HYGROTON ) 25 MG tablet TAKE 1 TABLET(25 MG) BY MOUTH DAILY    clopidogrel  (PLAVIX ) 75 MG tablet Take 1 tablet (75 mg total) by mouth daily.   ezetimibe  (ZETIA ) 10 MG tablet TAKE 1 TABLET(10 MG) BY MOUTH DAILY   gabapentin  (NEURONTIN ) 100 MG capsule TAKE 1 CAPSULE(100 MG) BY MOUTH FOUR TIMES DAILY   hydrALAZINE  (APRESOLINE ) 25 MG tablet Take 1 tablet (25 mg total) by mouth 2 (two) times daily.   HYDROcodone -acetaminophen  (NORCO/VICODIN) 5-325 MG tablet Take 1 tablet by mouth every 6 (six) hours as needed.   hydrocortisone  (ANUSOL -HC) 2.5 % rectal cream Place 1 Application rectally 2 (two) times daily. As needed for rectal discomfort.   lidocaine  (LIDODERM ) 5 % Place 1 patch onto the skin daily. Remove & Discard patch within 12 hours or as directed by MD   metoprolol  succinate (TOPROL -XL) 50 MG 24 hr tablet Take 1 tablet (50 mg total) by mouth daily. Take with or immediately following a meal.   montelukast  (SINGULAIR ) 10 MG tablet Take 1 tablet (10 mg total) by mouth at bedtime.   Multiple Vitamin (MULTIVITAMIN) tablet Take 1 tablet by mouth in the morning.   nystatin  cream (MYCOSTATIN ) Apply 1 Application topically 2 (two) times daily. Between groin.   nystatin  ointment (MYCOSTATIN ) Apply 1 Application topically 2 (two) times daily. As needed between groin creases   pantoprazole  (PROTONIX ) 40 MG tablet TAKE 1 TABLET BY MOUTH EVERY MORNING BEFORE BREAKFAST   rosuvastatin  (CRESTOR ) 40 MG tablet Take 1 tablet (40 mg total) by mouth daily.   No facility-administered encounter medications on file as of 06/23/2024.   ALLERGIES: Allergies  Allergen Reactions   Nsaids Other (See Comments)    AVOID all NSAID due to history of GI bleeding   Atorvastatin  Other (See Comments)    Joint & muscle pain    VACCINATION STATUS: Immunization History  Administered Date(s) Administered   Pneumococcal Conjugate-13 10/02/2016   Pneumococcal Polysaccharide-23 08/25/2013   Td (Adult),5 Lf Tetanus Toxid, Preservative Free 02/18/1998   Tdap 07/14/2022   Zoster  Recombinant(Shingrix) 03/17/2018    Thyroid  Problem Presents for follow-up visit. Patient reports no anxiety, cold intolerance, constipation, depressed mood, fatigue, heat intolerance, leg swelling, palpitations, tremors, weight gain or weight loss. The symptoms have been stable.    LEINA BABE is 84 y.o. female who is seen in follow-up after she was seen in consultation for subclinical hyperthyroidism.    PMD: [name], Fonda DELENA, MD.  Due to septic symptoms of thyrotoxicosis, she was started on low-dose methimazole , which was since tapered down.  She has been off the Methimazole  for some time now with stable thyroid  response.  She denies any history of goiter, no family history of thyroid  dysfunction or thyroid  malignancy.  Her thyroid  uptake and scan on June 14, 2019 showed 24-hour uptake of 10.3% which is normal.  There was asymmetric thyroid  activity with generally increased uptake in the right lobe.  No focal hot or cold nodules identified.  Her medical history includes hypertension and hyperlipidemia on treatment.  Review of systems  Constitutional: + Minimally fluctuating body weight,  current Body mass index is 41.19 kg/m. , no fatigue, no subjective hyperthermia, no subjective hypothermia Eyes: no blurry vision, no xerophthalmia ENT: no sore throat, no nodules palpated in throat, no dysphagia/odynophagia, no hoarseness Cardiovascular: no chest  pain, no shortness of breath, no palpitations, no leg swelling Respiratory: no cough, no shortness of breath Gastrointestinal: no nausea/vomiting/diarrhea Musculoskeletal: left knee pain- walking with cane -gets steroid injections intermittently Skin: no rashes, no hyperemia Neurological: no tremors, no numbness, no tingling, no dizziness Psychiatric: no depression, no anxiety  Objective:    BP 116/66 (BP Location: Left Arm, Patient Position: Sitting, Cuff Size: Large)   Pulse 99   Ht 5' 2 (1.575 m)   Wt 225 lb 3.2 oz  (102.2 kg)   BMI 41.19 kg/m   Wt Readings from Last 3 Encounters:  06/23/24 225 lb 3.2 oz (102.2 kg)  06/14/24 223 lb (101.2 kg)  06/02/24 223 lb 4.8 oz (101.3 kg)    BP Readings from Last 3 Encounters:  06/23/24 116/66  06/14/24 138/75  06/02/24 (!) 149/77     Physical Exam- Limited  Constitutional:  Body mass index is 41.19 kg/m. , not in acute distress, normal state of mind Eyes:  EOMI, no exophthalmos Cardiovascular: RRR Musculoskeletal: no gross deformities, strength intact in all four extremities, no gross restriction of joint movements, walks with cane for bilateral knee pain Skin:  no rashes, no hyperemia Neurological: no tremor with outstretched hands    CMP     Component Value Date/Time   NA 144 02/04/2024 1503   K 4.6 02/04/2024 1503   CL 107 (H) 02/04/2024 1503   CO2 20 02/04/2024 1503   GLUCOSE 126 (H) 02/04/2024 1503   GLUCOSE 93 01/21/2024 1505   BUN 39 (H) 02/04/2024 1503   CREATININE 2.03 (H) 02/04/2024 1503   CREATININE 1.31 (H) 12/21/2018 1547   CALCIUM  10.0 02/04/2024 1503   PROT 6.9 02/04/2024 1503   ALBUMIN 3.9 02/04/2024 1503   AST 16 02/04/2024 1503   ALT 17 02/04/2024 1503   ALKPHOS 89 02/04/2024 1503   BILITOT <0.2 02/04/2024 1503   GFRNONAA 20 (L) 01/21/2024 1505   GFRAA 34 (L) 04/30/2020 1510     Diabetic Labs (most recent): Lab Results  Component Value Date   HGBA1C 5.8 (H) 02/04/2024   HGBA1C 5.8 (H) 07/31/2023   HGBA1C 5.6 04/20/2023   MICROALBUR 18.6 (H) 04/05/2021     Lipid Panel ( most recent) Lipid Panel     Component Value Date/Time   CHOL 139 02/04/2024 1503   TRIG 85 02/04/2024 1503   HDL 56 02/04/2024 1503   CHOLHDL 2.5 02/04/2024 1503   CHOLHDL 2.6 11/20/2016 0854   VLDL 11 11/20/2016 0854   LDLCALC 67 02/04/2024 1503   LABVLDL 16 02/04/2024 1503      Lab Results  Component Value Date   TSH 0.351 (L) 05/25/2024   TSH 0.322 (L) 03/14/2024   TSH 0.347 (L) 11/30/2023   TSH 0.546 09/29/2023   TSH  0.488 07/22/2023   TSH 0.682 04/08/2023   TSH 0.478 01/15/2023   TSH 0.723 12/03/2022   TSH 0.069 (L) 08/18/2022   TSH 0.175 (L) 05/21/2022   FREET4 0.85 05/25/2024   FREET4 0.87 03/14/2024   FREET4 1.03 11/30/2023   FREET4 0.85 07/22/2023   FREET4 0.94 04/08/2023   FREET4 1.06 12/03/2022   FREET4 1.03 08/18/2022   FREET4 0.98 05/21/2022   FREET4 1.11 02/03/2022   FREET4 1.11 10/17/2021     Latest Reference Range & Units 07/22/23 16:17 09/29/23 18:32 11/30/23 16:24 03/14/24 15:56  TSH 0.450 - 4.500 uIU/mL 0.488 0.546 0.347 (L) 0.322 (L)  Triiodothyronine,Free,Serum 2.0 - 4.4 pg/mL 2.5  3.2 3.0  T4,Free(Direct) 0.82 - 1.77 ng/dL  0.85  1.03 0.87  (L): Data is abnormally low  Assessment & Plan:   1. Subclinical hyperthyroidism  -Her most recent thyroid  function tests are once again stable, TSH marginally suppressed, FT3 and FT4 are normal.  There is no need for antithyroid treatment at this time.   Will monitor TFTs again in 3 months (patient prefers to have more frequent checkups than the 6 month I offered her).    - she is advised to maintain close follow up with [name], Fonda LABOR, MD for primary care needs.    I spent  12  minutes in the care of the patient today including review of labs from Thyroid  Function, CMP, and other relevant labs ; imaging/biopsy records (current and previous including abstractions from other facilities); face-to-face time discussing  her lab results and symptoms, medications doses, her options of short and long term treatment based on the latest standards of care / guidelines;   and documenting the encounter.  Shelly LABOR [name]  participated in the discussions, expressed understanding, and voiced agreement with the above plans.  All questions were answered to her satisfaction. she is encouraged to contact clinic should she have any questions or concerns prior to her return visit.   Follow up plan: Return in about 3 months (around 09/20/2024) for  Thyroid  follow up, Previsit labs.   Benton Rio, [facility] [address] [place], [place] [address] Phone: [phone] Fax: [phone]   06/23/2024, 2:53 PM    "

## 2024-06-24 ENCOUNTER — Encounter: Payer: Self-pay | Admitting: Cardiology

## 2024-06-24 ENCOUNTER — Ambulatory Visit: Admitting: Cardiology

## 2024-06-24 ENCOUNTER — Other Ambulatory Visit (HOSPITAL_BASED_OUTPATIENT_CLINIC_OR_DEPARTMENT_OTHER): Payer: Self-pay

## 2024-06-24 NOTE — Progress Notes (Unsigned)
 "     Clinical Summary Shelly [name] is a 84 y.o.female  seen today for follow up of the following medical problems   1. CAD - history of NSTEMI, DES x 2 to RCA 08/2013 - she is on plavix  for secondary prevention due to bleeding on ASA per [name] notes   - no chest pains, no SOB/DOE - compliant with meds     2. Hyperlipidemia [name] aches on atorvastatin , has tolerated cresto     High risk patient prior MI, age >80, HTN, CKD. Goal LDL would be <55 - 09/2022 TC 151 TG 96 HDL 53 LDL 80. This was about 4 weeks after we had increased her crestor    07/2023 TC 149 TG 68 HDL 52 LDL 83-she thinks may have run out of either her zetia  or crestor , not sure which one  01/2024 TC 139 TG 85 HDL 56 LDL 67     3. HTN - norvasc  caused LE edema, though retried during 04/24/20 appt with [name] - occasional leg swelling though infrequent   - 08/20/22 appt with renal high bp's. Norvasc  and hydralazine  were started, she states has not picked up or started this medications yet           4. Postherpetic neuralgia   5. CKD 3 - follwed by [name], reports appt coming.      6. Bilatearl LE edema - 08/2019 echo LVEF 65-70%, grade I dd, normal RV function, moderate pulm HTN PASP 40 - swelling comes and goes.    7. 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 s/p NSTEMI in 2015 with DES x2 to RCA   Cataract    Chronic kidney disease    Coronary artery disease    SEVERE   GERD (gastroesophageal reflux disease)    GI bleed 12/2013   Goiter    Hyperlipidemia    Hypertension    Myocardial infarct [facility])    Myocardial infarction (HCC) 08/2013   Neuralgia of right lower extremity 07/13/2017   Pancolonic  diverticulosis 12/2013   Post herpetic neuralgia      Allergies[1]   Current Outpatient Medications  Medication Sig Dispense Refill   acetaminophen  (TYLENOL ) 500 MG tablet Take 2 tablets (1,000 mg total) by mouth every 6 (six) hours. 30 tablet 0   amitriptyline  (ELAVIL ) 25 MG tablet Take 1 tablet (25 mg total) by mouth at bedtime. 90 tablet 0   baclofen  (LIORESAL ) 10 MG tablet Take 10 mg by mouth in the morning.     chlorthalidone  (HYGROTON ) 25 MG tablet TAKE 1 TABLET(25 MG) BY MOUTH DAILY 90 tablet 0   clopidogrel  (PLAVIX ) 75 MG tablet Take 1 tablet (75 mg total) by mouth daily. 90 tablet 3   ezetimibe  (ZETIA ) 10 MG tablet TAKE 1 TABLET(10 MG) BY MOUTH DAILY 90 tablet 1   gabapentin  (NEURONTIN ) 100 MG capsule TAKE 1 CAPSULE(100 MG) BY MOUTH FOUR TIMES DAILY 360 capsule 0   hydrALAZINE  (APRESOLINE ) 25 MG tablet Take 1 tablet (25 mg total) by mouth 2 (two) times daily. 60 tablet 3   HYDROcodone -acetaminophen  (NORCO/VICODIN) 5-325 MG tablet Take 1 tablet by  mouth every 6 (six) hours as needed. 6 tablet 0   hydrocortisone  (ANUSOL -HC) 2.5 % rectal cream Place 1 Application rectally 2 (two) times daily. As needed for rectal discomfort. 30 g 1   lidocaine  (LIDODERM ) 5 % Place 1 patch onto the skin daily. Remove & Discard patch within 12 hours or as directed by MD 30 patch 0   metoprolol  succinate (TOPROL -XL) 50 MG 24 hr tablet Take 1 tablet (50 mg total) by mouth daily. Take with or immediately following a meal. 30 tablet 3   montelukast  (SINGULAIR ) 10 MG tablet Take 1 tablet (10 mg total) by mouth at bedtime. 90 tablet 3   Multiple Vitamin (MULTIVITAMIN) tablet Take 1 tablet by mouth in the morning.     nystatin  cream (MYCOSTATIN ) Apply 1 Application topically 2 (two) times daily. Between groin. 30 g 1   nystatin  ointment (MYCOSTATIN ) Apply 1 Application topically 2 (two) times daily. As needed between groin creases 30 g 1   pantoprazole  (PROTONIX ) 40 MG tablet TAKE 1 TABLET BY MOUTH EVERY  MORNING BEFORE BREAKFAST 90 tablet 3   rosuvastatin  (CRESTOR ) 40 MG tablet Take 1 tablet (40 mg total) by mouth daily. 90 tablet 2   No current facility-administered medications for this visit.     Past Surgical History:  Procedure Laterality Date   CARPAL TUNNEL RELEASE Bilateral    COLONOSCOPY N/A 01/13/2014   Dr. Hung:pandiverticulosis    COLONOSCOPY N/A 12/14/2015   [name]: pancolonic diverticulosis, internal hemorrhoids    CORNEAL TRANSPLANT     CORONARY ANGIOPLASTY  08/2013   ESOPHAGOGASTRODUODENOSCOPY N/A 12/14/2015   [name]: normal    GIVENS CAPSULE STUDY N/A 08/13/2016   [name]: enteritis due to aspirin .    HEEL SPUR EXCISION     LEFT HEART CATHETERIZATION WITH CORONARY ANGIOGRAM N/A 08/23/2013   Procedure: LEFT HEART CATHETERIZATION WITH CORONARY ANGIOGRAM;  Surgeon: Peter M Jordan, MD;  Location: [facility] CATH LAB;  Service: Cardiovascular;  Laterality: N/A;   PERCUTANEOUS CORONARY STENT INTERVENTION (PCI-S) N/A 08/24/2013   Procedure: PERCUTANEOUS CORONARY STENT INTERVENTION (PCI-S);  Surgeon: Peter M Jordan, MD;  Location: [facility] CATH LAB;  Service: Cardiovascular;  Laterality: N/A;   TONSILLECTOMY     TUBAL LIGATION       Allergies[2]    Family History  Problem Relation Age of Onset   Cancer Mother        unsure of type    Other Father        grangrene    Asthma Sister    Stroke Sister    COPD Sister    Stroke Sister    Hypertension Brother    Transient ischemic attack Brother    Early death Brother    Early death Brother    Liver cancer Daughter    Cancer Daughter    Pancreatic disease Daughter        pancreatectomy   Cancer Daughter    Diverticulitis Daughter    Arthritis Daughter        knee    Arthritis Daughter        shoulder    Anemia Daughter    Hernia Son        Umbilical   GI problems Son    Liver cancer Son    GER disease Son    Colon cancer Neg Hx    Colon polyps Neg Hx      Social History Shelly [name] reports that she has  never smoked. She has never used smokeless  tobacco. Shelly [name] reports no history of alcohol use.   Review of Systems CONSTITUTIONAL: No weight loss, fever, chills, weakness or fatigue.  HEENT: Eyes: No visual loss, blurred vision, double vision or yellow sclerae.No hearing loss, sneezing, congestion, runny nose or sore throat.  SKIN: No rash or itching.  CARDIOVASCULAR:  RESPIRATORY: No shortness of breath, cough or sputum.  GASTROINTESTINAL: No anorexia, nausea, vomiting or diarrhea. No abdominal pain or blood.  GENITOURINARY: No burning on urination, no polyuria NEUROLOGICAL: No headache, dizziness, syncope, paralysis, ataxia, numbness or tingling in the extremities. No change in bowel or bladder control.  MUSCULOSKELETAL: No muscle, back pain, joint pain or stiffness.  LYMPHATICS: No enlarged nodes. No history of splenectomy.  PSYCHIATRIC: No history of depression or anxiety.  ENDOCRINOLOGIC: No reports of sweating, cold or heat intolerance. No polyuria or polydipsia.  SABRA   Physical Examination There were no vitals filed for this visit. There were no vitals filed for this visit.  Gen: resting comfortably, no acute distress HEENT: no scleral icterus, pupils equal round and reactive, no palptable cervical adenopathy,  CV Resp: Clear to auscultation bilaterally GI: abdomen is soft, non-tender, non-distended, normal bowel sounds, no hepatosplenomegaly MSK: extremities are warm, no edema.  Skin: warm, no rash Neuro:  no focal deficits Psych: appropriate affect   Diagnostic Studies     Assessment and Plan  1. CAD - no symptoms, continue current meds   2. Hyperlipidemia - labs pending with pcp -refill her crestor  and zetia , continue current therapy   3. HTN -at goal, continue current meds     4.Leg edema - overall stable, she is not intersested in lymphedema clinic due to transportation limitations      Dorn PHEBE Ross, M.D., F.A.C.C.     [1]   Allergies Allergen Reactions   Nsaids Other (See Comments)    AVOID all NSAID due to history of GI bleeding   Atorvastatin  Other (See Comments)    Joint & muscle pain  [2]  Allergies Allergen Reactions   Nsaids Other (See Comments)    AVOID all NSAID due to history of GI bleeding   Atorvastatin  Other (See Comments)    Joint & muscle pain   "

## 2024-06-24 NOTE — Patient Instructions (Addendum)
 Medication Instructions:  Continue all current medications.   Labwork: none  Testing/Procedures: none  Follow-Up: 6 months   Any Other Special Instructions Will Be Listed Below (If Applicable).   If you need a refill on your cardiac medications before your next appointment, please call your pharmacy.

## 2024-07-21 ENCOUNTER — Other Ambulatory Visit

## 2024-07-21 ENCOUNTER — Ambulatory Visit: Admitting: Family Medicine

## 2024-08-23 ENCOUNTER — Ambulatory Visit: Admitting: Gastroenterology

## 2024-09-19 ENCOUNTER — Institutional Professional Consult (permissible substitution): Admitting: Neurology

## 2024-10-03 ENCOUNTER — Ambulatory Visit: Admitting: Nurse Practitioner

## 2025-06-15 ENCOUNTER — Ambulatory Visit
